# Patient Record
Sex: Male | Born: 1965 | Race: White | Hispanic: No | Smoking: Never smoker
Health system: Southern US, Community
[De-identification: ages and names within clinical notes are randomized; demographics above are authoritative.]

## PROBLEM LIST (undated history)

## (undated) DIAGNOSIS — I739 Peripheral vascular disease, unspecified: Secondary | ICD-10-CM

## (undated) DIAGNOSIS — E119 Type 2 diabetes mellitus without complications: Secondary | ICD-10-CM

## (undated) DIAGNOSIS — N186 End stage renal disease: Secondary | ICD-10-CM

## (undated) DIAGNOSIS — I10 Essential (primary) hypertension: Secondary | ICD-10-CM

## (undated) HISTORY — DX: Peripheral vascular disease, unspecified: I73.9

## (undated) HISTORY — DX: Essential (primary) hypertension: I10

## (undated) HISTORY — DX: Type 2 diabetes mellitus without complications: E11.9

## (undated) HISTORY — DX: End stage renal disease: N18.6

## (undated) HISTORY — PX: AV FISTULA PLACEMENT: SHX1204

## (undated) HISTORY — PX: PARACENTESIS: SHX844

---

## 2014-05-09 ENCOUNTER — Encounter (INDEPENDENT_AMBULATORY_CARE_PROVIDER_SITE_OTHER): Payer: PRIVATE HEALTH INSURANCE | Admitting: Ophthalmology

## 2014-05-09 DIAGNOSIS — E1165 Type 2 diabetes mellitus with hyperglycemia: Secondary | ICD-10-CM

## 2014-05-09 DIAGNOSIS — H43819 Vitreous degeneration, unspecified eye: Secondary | ICD-10-CM

## 2014-05-09 DIAGNOSIS — E11319 Type 2 diabetes mellitus with unspecified diabetic retinopathy without macular edema: Secondary | ICD-10-CM

## 2014-05-09 DIAGNOSIS — I10 Essential (primary) hypertension: Secondary | ICD-10-CM

## 2014-05-09 DIAGNOSIS — H3581 Retinal edema: Secondary | ICD-10-CM

## 2014-05-09 DIAGNOSIS — E1139 Type 2 diabetes mellitus with other diabetic ophthalmic complication: Secondary | ICD-10-CM

## 2014-05-09 DIAGNOSIS — H35039 Hypertensive retinopathy, unspecified eye: Secondary | ICD-10-CM

## 2014-05-09 DIAGNOSIS — H2510 Age-related nuclear cataract, unspecified eye: Secondary | ICD-10-CM

## 2014-06-16 ENCOUNTER — Ambulatory Visit: Payer: Self-pay | Admitting: Cardiology

## 2014-06-16 ENCOUNTER — Encounter: Payer: Self-pay | Admitting: Cardiology

## 2014-06-16 ENCOUNTER — Ambulatory Visit (INDEPENDENT_AMBULATORY_CARE_PROVIDER_SITE_OTHER): Payer: PRIVATE HEALTH INSURANCE | Admitting: Cardiology

## 2014-06-16 VITALS — BP 108/73 | HR 102 | Ht 74.0 in | Wt 161.8 lb

## 2014-06-16 DIAGNOSIS — I951 Orthostatic hypotension: Secondary | ICD-10-CM | POA: Diagnosis not present

## 2014-06-16 DIAGNOSIS — G909 Disorder of the autonomic nervous system, unspecified: Secondary | ICD-10-CM | POA: Diagnosis not present

## 2014-06-16 DIAGNOSIS — E1149 Type 2 diabetes mellitus with other diabetic neurological complication: Secondary | ICD-10-CM

## 2014-06-16 DIAGNOSIS — E1143 Type 2 diabetes mellitus with diabetic autonomic (poly)neuropathy: Secondary | ICD-10-CM

## 2014-06-16 MED ORDER — MIDODRINE HCL 5 MG PO TABS: 5.0000 mg | ORAL_TABLET | Freq: Three times a day (TID) | ORAL | Status: DC

## 2014-06-16 NOTE — Patient Instructions (Signed)
   Start Midodrine 5mg  three times per day. Continue all other medications.   Follow up in  1 month

## 2014-06-16 NOTE — Progress Notes (Signed)
Clinical Summary David Rose is a 48 y.o.male seen today as a new patient.    1. Syncope - approx 1 month ago, 1st episode ever - occurred while at [facility] getting chest ray while standing. Denies any prodrome. Thinks he was out just a few seconds. No palpitations, no chest pain. - has had some dizziness ongoing for 6 months. Can occur with laying, but mainly with standing. Dizziness occurs daily.  - has increased fluid intake recently without much change - from his report discharged after 1 day at [facility].  - reportedly orthostatic in hospital but numbers are not documented - has had overall fatigue, low energy. Mild DOE.   - drinks 2 bottles of water, one large powerade. No sodas, no tea, only occas coffee. - wearing compression stockings as well  2. DM - Hgb A1c 14 during recent admission - followed by pcp   PMH DM    Allergies  Allergen Reactions  . Penicillins      Current Outpatient Prescriptions  Medication Sig Dispense Refill  . aspirin 81 MG tablet Take 81 mg by mouth daily.      . Canagliflozin-Metformin HCl (INVOKAMET) 150-500 MG TABS Take 1 tablet by mouth 2 (two) times daily.      . Cyanocobalamin (VITAMIN B-12 CR PO) Take 1 tablet by mouth daily.      Marland Kitchen glimepiride (AMARYL) 4 MG tablet Take 2 mg by mouth every morning.      Marland Kitchen lisinopril (PRINIVIL,ZESTRIL) 2.5 MG tablet Take 2.5 mg by mouth daily.      . Multiple Vitamin (MULTIVITAMIN) tablet Take 1 tablet by mouth daily.       No current facility-administered medications for this visit.     No past surgical history on file.   Allergies  Allergen Reactions  . Penicillins       No family history on file.   Social History David Rose reports that he has never smoked. He does not have any smokeless tobacco history on file. David Rose has no alcohol history on file.   Review of Systems CONSTITUTIONAL: No weight loss, fever, chills, weakness or fatigue.  HEENT: Eyes: No visual loss,  blurred vision, double vision or yellow sclerae.No hearing loss, sneezing, congestion, runny nose or sore throat.  SKIN: No rash or itching.  CARDIOVASCULAR: no chest pain, no palpitations RESPIRATORY: No shortness of breath, cough or sputum.  GASTROINTESTINAL: No anorexia, nausea, vomiting or diarrhea. No abdominal pain or blood.  GENITOURINARY: No burning on urination, no polyuria NEUROLOGICAL: + dizziness  MUSCULOSKELETAL: No muscle, back pain, joint pain or stiffness.  LYMPHATICS: No enlarged nodes. No history of splenectomy.  PSYCHIATRIC: No history of depression or anxiety.  ENDOCRINOLOGIC: No reports of sweating, cold or heat intolerance. No polyuria or polydipsia.  Marland Kitchen   Physical Examination There were no vitals filed for this visit. Filed Weights   06/16/14 1451  Weight: 161 lb 12.8 oz (73.392 kg)   Lying p 97 bp 132/89 Sitting p 102 bp 102/71  Standing bp 73/49 p 116  Gen: resting comfortably, no acute distress HEENT: no scleral icterus, pupils equal round and reactive, no palptable cervical adenopathy,  CV: RRR, no m/r/g, no JVD, no carotid brutis Resp: Clear to auscultation bilaterally GI: abdomen is soft, non-tender, non-distended, normal bowel sounds, no hepatosplenomegaly MSK: extremities are warm, no edema.  Skin: warm, no rash Neuro:  no focal deficits Psych: appropriate affect   Diagnostic Studies  [place] EKG NSR   Assessment  and Plan  1. Syncope - severely orthostatic by vitals in clinic today (SBP dropped 60 mmHg), he likely does have autonomic dysfunction related to his severe uncontrolled diabetes - symptoms continue despite increased oral intake, counseled to continue increased electrolyte rich fluids, increase salt in diet - will start midodrine 5mg  tid  F/u 1 month     Arnoldo Lenis, M.D., F.A.C.C.

## 2014-06-17 DIAGNOSIS — R55 Syncope and collapse: Secondary | ICD-10-CM | POA: Insufficient documentation

## 2014-06-17 DIAGNOSIS — I951 Orthostatic hypotension: Secondary | ICD-10-CM | POA: Insufficient documentation

## 2014-07-11 ENCOUNTER — Encounter: Payer: Self-pay | Admitting: Cardiology

## 2014-07-11 ENCOUNTER — Ambulatory Visit (INDEPENDENT_AMBULATORY_CARE_PROVIDER_SITE_OTHER): Payer: PRIVATE HEALTH INSURANCE | Admitting: Cardiology

## 2014-07-11 VITALS — BP 83/51 | HR 106 | Ht 73.0 in | Wt 159.8 lb

## 2014-07-11 DIAGNOSIS — I951 Orthostatic hypotension: Secondary | ICD-10-CM

## 2014-07-11 DIAGNOSIS — E1143 Type 2 diabetes mellitus with diabetic autonomic (poly)neuropathy: Secondary | ICD-10-CM

## 2014-07-11 DIAGNOSIS — G909 Disorder of the autonomic nervous system, unspecified: Secondary | ICD-10-CM

## 2014-07-11 DIAGNOSIS — E1149 Type 2 diabetes mellitus with other diabetic neurological complication: Secondary | ICD-10-CM

## 2014-07-11 MED ORDER — FLUDROCORTISONE ACETATE 0.1 MG PO TABS: 0.1000 mg | ORAL_TABLET | Freq: Every day | ORAL | Status: DC

## 2014-07-11 NOTE — Progress Notes (Signed)
Clinical Summary  [name] is a 48 y.o.male seen today as a new patient.  1. Syncope  - approx 1 month ago, 1st episode ever  - occurred while at [facility] getting chest ray while standing. Denies any prodrome. Thinks he was out just a few seconds. No palpitations, no chest pain.  - has had some dizziness ongoing for 6 months. Can occur with laying, but mainly with standing. Dizziness occurs daily.  - last visit noticed to be severely orthostatic in clinic, likely diabetes related autonomic insufficiency, started on midodrine 5mg  tid. - no change in symptoms since our last visit  2. DM  - Hgb A1c 14 during recent admission  - followed by pcp    PMH  DM      Allergies  Allergen Reactions  . Penicillins      Current Outpatient Prescriptions  Medication Sig Dispense Refill  . aspirin 81 MG tablet Take 81 mg by mouth daily.      . Canagliflozin-Metformin HCl (INVOKAMET) 150-500 MG TABS Take 1 tablet by mouth 2 (two) times daily.      . Cyanocobalamin (VITAMIN B-12 CR PO) Take 1 tablet by mouth daily.      Marland Kitchen glimepiride (AMARYL) 4 MG tablet Take 2 mg by mouth every morning.      Marland Kitchen lisinopril (PRINIVIL,ZESTRIL) 2.5 MG tablet Take 2.5 mg by mouth daily.      . midodrine (PROAMATINE) 5 MG tablet Take 1 tablet (5 mg total) by mouth 3 (three) times daily.  90 tablet  6  . Multiple Vitamin (MULTIVITAMIN) tablet Take 1 tablet by mouth daily.       No current facility-administered medications for this visit.     No past surgical history on file.   Allergies  Allergen Reactions  . Penicillins       No family history on file.   Social History [name] reports that he has never smoked. He does not have any smokeless tobacco history on file. [name] has no alcohol history on file.   Review of Systems CONSTITUTIONAL: No weight loss, fever, chills, weakness or fatigue.  HEENT: Eyes: No visual loss, blurred vision, double vision or yellow sclerae.No hearing  loss, sneezing, congestion, runny nose or sore throat.  SKIN: No rash or itching.  CARDIOVASCULAR: no chest pain,no palpitations RESPIRATORY: No shortness of breath, cough or sputum.  GASTROINTESTINAL: No anorexia, nausea, vomiting or diarrhea. No abdominal pain or blood.  GENITOURINARY: No burning on urination, no polyuria NEUROLOGICAL: No headache, dizziness, syncope, paralysis, ataxia, numbness or tingling in the extremities. No change in bowel or bladder control.  MUSCULOSKELETAL: No muscle, back pain, joint pain or stiffness.  LYMPHATICS: No enlarged nodes. No history of splenectomy.  PSYCHIATRIC: No history of depression or anxiety.  ENDOCRINOLOGIC: No reports of sweating, cold or heat intolerance. No polyuria or polydipsia.  Marland Kitchen   Physical Examination p 106 bp 83/51 Wt 159 lbs BMI 21 Gen: resting comfortably, no acute distress HEENT: no scleral icterus, pupils equal round and reactive, no palptable cervical adenopathy,  CV: RRR, no m/r/g, no JVD, no carotid bruits Resp: Clear to auscultation bilaterally GI: abdomen is soft, non-tender, non-distended, normal bowel sounds, no hepatosplenomegaly MSK: extremities are warm, no edema.  Skin: warm, no rash Neuro:  no focal deficits Psych: appropriate affect      Assessment and Plan  1. Diabetic autonomic neuropathy - severely orthostatic by vitals in clinic today (SBP dropped 60 mmHg), he likely does have  autonomic dysfunction related to his severe uncontrolled diabetes  - symptoms continue despite increased oral intake and starting midodrine 5mg  tid - midodrine very expensive for him, we contacted his pharmacy and appears that florinef would be much cheaper. Will change to florinef 0.1mg  daily, titrate as needed - try to continue low dose ACE-I given his DM, ultimately we may have to stop - suspect most likely this is autonomic dysfunction, his K 4.2 and Na 137 do not suggest adrenal insufficiency. Will request most recent labs  from pcp to see if he had TSH done.     F/u 1 month     Arnoldo Lenis, M.D.

## 2014-07-11 NOTE — Patient Instructions (Addendum)
   Stop Midodrine  Start Florinef 0.1mg  daily. Sent to pharmacy. Continue all other medications.   Your physician wants you to follow up in:  3 months.

## 2014-07-17 ENCOUNTER — Encounter: Payer: Self-pay | Admitting: Cardiology

## 2014-07-18 ENCOUNTER — Telehealth: Payer: Self-pay | Admitting: *Deleted

## 2014-07-18 NOTE — Telephone Encounter (Signed)
Wife Manuela Schwartz) calling with update on symptoms.  Does seem to feel a little better right after taking medication, but after standing for a while - still lightheaded & affecting his vision.  Not really much change.  Stated his BP's have stayed around same as was in office.  Wife informed message will be sent to provider for advice.

## 2014-07-21 MED ORDER — FLUDROCORTISONE ACETATE 0.1 MG PO TABS: 0.2000 mg | ORAL_TABLET | Freq: Every day | ORAL | Status: DC

## 2014-07-21 NOTE — Telephone Encounter (Signed)
Lets have him increase his florinef to 0.2mg  po qday and call back in 2 weeks with update on symptoms   Zandra Abts MD

## 2014-07-21 NOTE — Telephone Encounter (Signed)
Wife Manuela Schwartz) notified.

## 2014-07-21 NOTE — Telephone Encounter (Signed)
Wife calling back to inquire if MD answered message from Friday.  Advised her that I would send message again & try to get answer before end of day.

## 2014-07-30 ENCOUNTER — Other Ambulatory Visit: Payer: Self-pay | Admitting: *Deleted

## 2014-07-30 ENCOUNTER — Telehealth: Payer: Self-pay | Admitting: Cardiology

## 2014-07-30 MED ORDER — FLUDROCORTISONE ACETATE 0.1 MG PO TABS: 0.2000 mg | ORAL_TABLET | Freq: Every day | ORAL | Status: DC

## 2014-07-30 NOTE — Telephone Encounter (Signed)
David Rose was told to increase his florinef to 0.2mg  po qday - Will need a refill due to the increase of this medication. [facility] [place], [place]

## 2014-07-30 NOTE — Telephone Encounter (Signed)
RF sent. Pts wife notified.

## 2014-08-05 ENCOUNTER — Telehealth: Payer: Self-pay | Admitting: *Deleted

## 2014-08-05 NOTE — Telephone Encounter (Signed)
2 week f/u after dose increase (florinef 0.2mg )  Per pts wife Pt is feeling better. After 15 mins pt still gets really dizzy (comes and goes) but it's better than before. Dizziness is still affecting his vison.  Pt asking if there will need to be anymore adjustments to his medications

## 2014-08-05 NOTE — Telephone Encounter (Signed)
There may be further adjustments upon Dr. Nelly Laurence next assessment. Continue present dose for now of Florinef.

## 2014-08-11 ENCOUNTER — Other Ambulatory Visit: Payer: Self-pay | Admitting: *Deleted

## 2014-08-11 MED ORDER — FLUDROCORTISONE ACETATE 0.1 MG PO TABS: 0.3000 mg | ORAL_TABLET | Freq: Every day | ORAL | Status: DC

## 2014-08-11 NOTE — Telephone Encounter (Signed)
Left message for pt to return call.

## 2014-08-11 NOTE — Telephone Encounter (Signed)
Pts wife informed.

## 2014-08-11 NOTE — Telephone Encounter (Signed)
Lets have him increase his florinef to 0.3mg  daily and call us inn 2 weeks with update.  Zandra Abts MD

## 2014-08-27 ENCOUNTER — Telehealth: Payer: Self-pay | Admitting: Cardiology

## 2014-08-27 DIAGNOSIS — R55 Syncope and collapse: Secondary | ICD-10-CM

## 2014-08-27 DIAGNOSIS — I951 Orthostatic hypotension: Secondary | ICD-10-CM

## 2014-08-27 NOTE — Telephone Encounter (Signed)
WIFE Called back with update.  Still dizzy when he stands up and feels that his skin is still crawling  Would like to know if anything else needs to be done with his medication

## 2014-08-28 MED ORDER — FLUDROCORTISONE ACETATE 0.1 MG PO TABS: 0.4000 mg | ORAL_TABLET | Freq: Every day | ORAL | Status: DC

## 2014-08-28 NOTE — Telephone Encounter (Signed)
I'm not sure about the skin feelings he is having, do not see how that would be related to medication. Would discuss with pcp  Zandra Abts MD

## 2014-08-28 NOTE — Telephone Encounter (Signed)
Is he feeling any better on the new medicine? Please ask if he is having in swelling in his legs? Please also order a BMET for him with GFR.  I would bump up his florinef to 0.4mg  daily and have them call us back in 2 weeks. We are getting toward the max dose of florinef, if continued symptoms we may need to add back the midodrine in the near future   Zandra Abts MD

## 2014-08-28 NOTE — Telephone Encounter (Signed)
Per wife, patient has some good days but still having the dizziness. Also described a feeling of "crawling on patient's skin". Patient sill gets fatigued when walking. No c/o swelling in legs.

## 2014-08-28 NOTE — Telephone Encounter (Signed)
Patient's wife informed. Lab order faxed to [facility] lab.

## 2014-09-03 ENCOUNTER — Ambulatory Visit (INDEPENDENT_AMBULATORY_CARE_PROVIDER_SITE_OTHER): Payer: PRIVATE HEALTH INSURANCE | Admitting: Ophthalmology

## 2014-09-03 DIAGNOSIS — I10 Essential (primary) hypertension: Secondary | ICD-10-CM

## 2014-09-03 DIAGNOSIS — H35033 Hypertensive retinopathy, bilateral: Secondary | ICD-10-CM

## 2014-09-03 DIAGNOSIS — H43813 Vitreous degeneration, bilateral: Secondary | ICD-10-CM

## 2014-09-03 DIAGNOSIS — E11311 Type 2 diabetes mellitus with unspecified diabetic retinopathy with macular edema: Secondary | ICD-10-CM

## 2014-09-03 DIAGNOSIS — E11331 Type 2 diabetes mellitus with moderate nonproliferative diabetic retinopathy with macular edema: Secondary | ICD-10-CM

## 2014-09-03 DIAGNOSIS — E11329 Type 2 diabetes mellitus with mild nonproliferative diabetic retinopathy without macular edema: Secondary | ICD-10-CM

## 2014-09-05 ENCOUNTER — Encounter: Payer: Self-pay | Admitting: *Deleted

## 2014-09-05 ENCOUNTER — Telehealth: Payer: Self-pay | Admitting: *Deleted

## 2014-09-05 NOTE — Telephone Encounter (Signed)
-----   Message from Merlene Laughter, LPN sent at [id number] 11:04 AM EST -----   ----- Message -----    From: Arnoldo Lenis, MD    Sent: 09/05/2014   9:46 AM      To: Bernita Raisin, RN  Normal labs

## 2014-09-05 NOTE — Telephone Encounter (Signed)
Patient informed via my chart.

## 2014-09-10 ENCOUNTER — Ambulatory Visit (INDEPENDENT_AMBULATORY_CARE_PROVIDER_SITE_OTHER): Payer: PRIVATE HEALTH INSURANCE | Admitting: Ophthalmology

## 2014-09-22 ENCOUNTER — Telehealth: Payer: Self-pay | Admitting: Cardiology

## 2014-09-22 NOTE — Telephone Encounter (Signed)
Patient wife Manuela Schwartz states Dr. Harl Bowie was trying to regulate his blood pressure by adjusting the dosage and what he is currently on is not helping.

## 2014-09-22 NOTE — Telephone Encounter (Signed)
Left message to return call 

## 2014-09-23 NOTE — Telephone Encounter (Signed)
Patient called to give an update on his symptoms per Dr. Nelly Laurence request. Patient denies swelling but continue to have dizziness on the increased dose of florinef 0.4 mg daily. Patient has been checking his blood pressures at home randomly and says that his BP has been averaging around 80/40.

## 2014-09-29 NOTE — Telephone Encounter (Signed)
Left message to return call 

## 2014-09-29 NOTE — Telephone Encounter (Signed)
We are maxed out on the dosing of florinef. He is going to need to restart the midodrine that he was on previously 5mg  three times a day. Continue heavy salt and fluid intake.  Zandra Abts MD

## 2014-10-02 MED ORDER — MIDODRINE HCL 5 MG PO TABS: 5.0000 mg | ORAL_TABLET | Freq: Three times a day (TID) | ORAL | Status: DC

## 2014-10-02 NOTE — Telephone Encounter (Signed)
Patient notified.  Will send [facility] to [facility] [place].  He already has f/u scheduled for 10/10/14 at 4:00 with Dr. Harl Bowie.

## 2014-10-10 ENCOUNTER — Ambulatory Visit (INDEPENDENT_AMBULATORY_CARE_PROVIDER_SITE_OTHER): Payer: PRIVATE HEALTH INSURANCE | Admitting: Cardiology

## 2014-10-10 ENCOUNTER — Encounter: Payer: Self-pay | Admitting: Cardiology

## 2014-10-10 VITALS — BP 67/43 | HR 103 | Ht 73.0 in | Wt 155.0 lb

## 2014-10-10 DIAGNOSIS — I951 Orthostatic hypotension: Secondary | ICD-10-CM | POA: Diagnosis not present

## 2014-10-10 DIAGNOSIS — E1143 Type 2 diabetes mellitus with diabetic autonomic (poly)neuropathy: Secondary | ICD-10-CM | POA: Diagnosis not present

## 2014-10-10 DIAGNOSIS — R55 Syncope and collapse: Secondary | ICD-10-CM | POA: Diagnosis not present

## 2014-10-10 MED ORDER — FLUDROCORTISONE ACETATE 0.1 MG PO TABS: 0.5000 mg | ORAL_TABLET | Freq: Every day | ORAL | Status: DC

## 2014-10-10 NOTE — Progress Notes (Signed)
Clinical Summary David Rose is a 48 y.o.male seen today for follow up of the following medical problems.l  1. Autonomic insufficiency - previously noted to be severely orthostatic in clinic. Initiated on fludrocortisone which we have been titrating, and started on midodrine 5mg  tid - probable diabetic autonomic neuropathy, poorly controlled DM previously with HgbA1c of 14.  - symptoms were not controlled with fludrocortisone at 0.4mg  daily, increased oral intake, and compression stockings. Asked to start midodrine 5mg  tid which he has started but mistakingly stopped taking his florinef - continues to have orthostatic dizziness, no syncope   2. DM - followed by pcp Past Medical History  Diagnosis Date  . Type 2 diabetes mellitus   . Peripheral arterial disease      Allergies  Allergen Reactions  . Penicillins      Current Outpatient Prescriptions  Medication Sig Dispense Refill  . aspirin 81 MG tablet Take 81 mg by mouth daily.    . Canagliflozin-Metformin HCl (INVOKAMET) 150-500 MG TABS Take 1 tablet by mouth 2 (two) times daily.    . Cyanocobalamin (VITAMIN B-12 CR PO) Take 1 tablet by mouth daily.    . fludrocortisone (FLORINEF) 0.1 MG tablet Take 4 tablets (0.4 mg total) by mouth daily. 120 tablet 6  . glimepiride (AMARYL) 4 MG tablet Take 2 mg by mouth every morning.    Marland Kitchen lisinopril (PRINIVIL,ZESTRIL) 2.5 MG tablet Take 2.5 mg by mouth daily.    . midodrine (PROAMATINE) 5 MG tablet Take 1 tablet (5 mg total) by mouth 3 (three) times daily. 90 tablet 6  . Multiple Vitamin (MULTIVITAMIN) tablet Take 1 tablet by mouth daily.     No current facility-administered medications for this visit.     No past surgical history on file.   Allergies  Allergen Reactions  . Penicillins       Family History  Problem Relation Age of Onset  . Leukemia Father   . Thyroid cancer Mother      Social History David Rose reports that he has never smoked. He does not have  any smokeless tobacco history on file. David Rose has no alcohol history on file.   Review of Systems CONSTITUTIONAL: No weight loss, fever, chills, weakness or fatigue.  HEENT: Eyes: No visual loss, blurred vision, double vision or yellow sclerae.No hearing loss, sneezing, congestion, runny nose or sore throat.  SKIN: No rash or itching.  CARDIOVASCULAR: no chest pain, no palpitations RESPIRATORY: No shortness of breath, cough or sputum.  GASTROINTESTINAL: No anorexia, nausea, vomiting or diarrhea. No abdominal pain or blood.  GENITOURINARY: No burning on urination, no polyuria NEUROLOGICAL: +dizziness MUSCULOSKELETAL: No muscle, back pain, joint pain or stiffness.  LYMPHATICS: No enlarged nodes. No history of splenectomy.  PSYCHIATRIC: No history of depression or anxiety.  ENDOCRINOLOGIC: No reports of sweating, cold or heat intolerance. No polyuria or polydipsia.  Marland Kitchen   Physical Examination Gen: resting comfortably, no acute distress HEENT: no scleral icterus, pupils equal round and reactive, no palptable cervical adenopathy,  CV: RRR, no m/rg, no JVD Resp: Clear to auscultation bilaterally GI: abdomen is soft, non-tender, non-distended, normal bowel sounds, no hepatosplenomegaly MSK: extremities are warm, no edema.  Skin: warm, no rash Neuro:  no focal deficits Psych: appropriate affect   Diagnostic Studies  04/2014 Echo LVEF 55-60%, no significant valvular pathology   Assessment and Plan  1. Diabetic autonomic neuropathy - remains severely orthostatic by vitals in clinic today, he likely does have autonomic dysfunction related to  his severe uncontrolled diabetes  - will have him restart his florinef but increase to 0.5mg  daily. Midodrine is very expensive for him and he is asking to hold on this medication for now which we will do. - symptoms continue despite increased oral intake and starting midodrine 5mg  tid - try to continue low dose ACE-I given his DM, ultimately  we may have to stop - he will call in a few weeks to update on symptoms     David Rose, M.D.

## 2014-10-10 NOTE — Patient Instructions (Addendum)
   Hold Midodrine for now.  Begin Florinef 0.1mg  - 5 tabs daily - new sent to pharm Continue all other medications.   Call office in 1 week with update on how doing. Follow up in  3 months

## 2014-11-24 ENCOUNTER — Encounter: Payer: Self-pay | Admitting: Cardiology

## 2014-11-28 ENCOUNTER — Ambulatory Visit (INDEPENDENT_AMBULATORY_CARE_PROVIDER_SITE_OTHER): Payer: PRIVATE HEALTH INSURANCE | Admitting: Cardiology

## 2014-11-28 ENCOUNTER — Encounter: Payer: Self-pay | Admitting: *Deleted

## 2014-11-28 ENCOUNTER — Telehealth: Payer: Self-pay | Admitting: *Deleted

## 2014-11-28 ENCOUNTER — Encounter: Payer: Self-pay | Admitting: Cardiology

## 2014-11-28 VITALS — BP 160/90 | HR 120 | Ht 74.0 in | Wt 176.0 lb

## 2014-11-28 DIAGNOSIS — I951 Orthostatic hypotension: Secondary | ICD-10-CM | POA: Diagnosis not present

## 2014-11-28 DIAGNOSIS — E1143 Type 2 diabetes mellitus with diabetic autonomic (poly)neuropathy: Secondary | ICD-10-CM | POA: Diagnosis not present

## 2014-11-28 MED ORDER — FLUDROCORTISONE ACETATE 0.1 MG PO TABS: 0.0500 mg | ORAL_TABLET | Freq: Every day | ORAL | Status: DC

## 2014-11-28 NOTE — Telephone Encounter (Signed)
Pt made aware, updated medication list

## 2014-11-28 NOTE — Progress Notes (Signed)
Clinical Summary David Rose is a 49 y.o.male seen today for follow up of the following medical problems.   1. Autonomic insufficiency - probable diabetic autonomic neuropathy, poorly controlled DM previously with HgbA1c of 14. - initially on midodrine, changed to fludrocortisone due to costs. Have been titrating up dose. Last visit increased to 0.5 mg daily - developed some SOB, no LE edema but cxray showed pleural effusions. Fludro decreased back to 0.4mg  daily, pcp started on lasix.  - we have also continued low dose lisionpril for renal protection purposes.  - reports symptoms are overall mild even after medication change   Past Medical History  Diagnosis Date  . Type 2 diabetes mellitus   . Peripheral arterial disease      Allergies  Allergen Reactions  . Penicillins      Current Outpatient Prescriptions  Medication Sig Dispense Refill  . aspirin 81 MG tablet Take 81 mg by mouth daily.    . Canagliflozin-Metformin HCl (INVOKAMET) 150-500 MG TABS Take 1 tablet by mouth 2 (two) times daily.    . Cyanocobalamin (VITAMIN B-12 CR PO) Take 1 tablet by mouth daily.    . fludrocortisone (FLORINEF) 0.1 MG tablet Take 5 tablets (0.5 mg total) by mouth daily. 150 tablet 6  . glimepiride (AMARYL) 4 MG tablet Take 2 mg by mouth every morning.    Marland Kitchen lisinopril (PRINIVIL,ZESTRIL) 2.5 MG tablet Take 2.5 mg by mouth daily.    . Multiple Vitamin (MULTIVITAMIN) tablet Take 1 tablet by mouth daily.     No current facility-administered medications for this visit.     No past surgical history on file.   Allergies  Allergen Reactions  . Penicillins       Family History  Problem Relation Age of Onset  . Leukemia Father   . Thyroid cancer Mother      Social History David Rose reports that he has never smoked. He has never used smokeless tobacco. David Rose has no alcohol history on file.   Review of Systems CONSTITUTIONAL: No weight loss, fever, chills, weakness or  fatigue.  HEENT: Eyes: No visual loss, blurred vision, double vision or yellow sclerae.No hearing loss, sneezing, congestion, runny nose or sore throat.  SKIN: No rash or itching.  CARDIOVASCULAR: per HPI RESPIRATORY: No shortness of breath, cough or sputum.  GASTROINTESTINAL: No anorexia, nausea, vomiting or diarrhea. No abdominal pain or blood.  GENITOURINARY: No burning on urination, no polyuria NEUROLOGICAL: No headache, dizziness, syncope, paralysis, ataxia, numbness or tingling in the extremities. No change in bowel or bladder control.  MUSCULOSKELETAL: No muscle, back pain, joint pain or stiffness.  LYMPHATICS: No enlarged nodes. No history of splenectomy.  PSYCHIATRIC: No history of depression or anxiety.  ENDOCRINOLOGIC: No reports of sweating, cold or heat intolerance. No polyuria or polydipsia.  Marland Kitchen   Physical Examination p 96 bp 160/90 Wt 176 lbs BMI 23 Gen: resting comfortably, no acute distress HEENT: no scleral icterus, pupils equal round and reactive, no palptable cervical adenopathy,  CV: RRR, no m/r/g, no JVD, no carotid bruits Resp: Clear to auscultation bilaterally GI: abdomen is soft, non-tender, non-distended, normal bowel sounds, no hepatosplenomegaly MSK: extremities are warm, no edema.  Skin: warm, no rash Neuro:  no focal deficits Psych: appropriate affect   Diagnostic Studies  04/2014 Echo LVEF 55-60%, no significant valvular pathology    Assessment and Plan  1. Diabetic autonomic neuropathy - had been on fludrocortisone 0.5mg  daily, no LE edema but pleural effusions possibly related -  fludro decreased to 0.4 mg daily, will change lasix to prn only - continue to follow effusions, if neccesary may have to switch back to midodrine though cost has been an issue. Try to continue low dose ACE for renal protection given his uncontrolled DM  F/u 1 months    David Rose, M.D.

## 2014-11-28 NOTE — Patient Instructions (Signed)
Your physician recommends that you schedule a follow-up appointment in: 1 month with Dr. Harl Bowie  Your physician has recommended you make the following change in your medication:   DECREASE FLORINEF 1/2 TAB DAILY  WE WILL [place] DR. VYAS  Thank you for choosing [facility]!!

## 2014-11-28 NOTE — Telephone Encounter (Signed)
He should continue 0.4mg  daily (4 pills of the 0.1mg ) the way he was taking it.    Zandra Abts MD

## 2014-11-28 NOTE — Telephone Encounter (Signed)
Pt wants clarification on florinef. Says he was taking 4 pills daily of 0.1 mg. Is 0.05 mg daily the correct does as of changes today. Will forward to Dr. Harl Bowie

## 2014-12-12 ENCOUNTER — Telehealth: Payer: Self-pay | Admitting: Cardiology

## 2014-12-12 NOTE — Telephone Encounter (Signed)
Will forward to Dr. Branch. 

## 2014-12-12 NOTE — Telephone Encounter (Signed)
Glad swelling has improved. It seems that going up to 0.5mg  daily was what led to the swelling. If needed for dizziness I think he would do ok with 0.3 or 0.4 mg daily if his dizziness becomes worst.   Zandra Abts MD

## 2014-12-12 NOTE — Telephone Encounter (Signed)
Patient was told to call and give an update on how he was doing with the changes to his medication.  He is scheduled to see provider on March 2nd for follow up.  Patient is aware provider is off and the message would be seen by his nurse.

## 2014-12-12 NOTE — Telephone Encounter (Signed)
Pt made aware, and will call if swelling or dizziness becomes worse, pt says he has not had much dizziness and swelling better than before.

## 2014-12-24 ENCOUNTER — Ambulatory Visit (INDEPENDENT_AMBULATORY_CARE_PROVIDER_SITE_OTHER): Payer: PRIVATE HEALTH INSURANCE | Admitting: Cardiology

## 2014-12-24 ENCOUNTER — Encounter: Payer: Self-pay | Admitting: *Deleted

## 2014-12-24 ENCOUNTER — Encounter: Payer: Self-pay | Admitting: Cardiology

## 2014-12-24 VITALS — BP 155/88 | HR 90 | Ht 74.0 in | Wt 173.0 lb

## 2014-12-24 DIAGNOSIS — E1143 Type 2 diabetes mellitus with diabetic autonomic (poly)neuropathy: Secondary | ICD-10-CM

## 2014-12-24 MED ORDER — FUROSEMIDE 20 MG PO TABS: ORAL_TABLET | ORAL | Status: DC

## 2014-12-24 NOTE — Patient Instructions (Signed)
Your physician recommends that you schedule a follow-up appointment in: [place] DR. BRANCH  Your physician has recommended you make the following change in your medication:   START LASIX 20 MG DAILY AS NEEDED FOR SWELLING  CONTINUE ALL OTHER MEDICATIONS AS DIRECTED  WE WILL REQUEST CHEST XRAY FROM DR. VYAS.  Thank you for choosing [facility]!!

## 2014-12-24 NOTE — Progress Notes (Signed)
Clinical Summary David Rose is a 49 y.o.male seen today for follow up of the following medical problems.   1. Autonomic insufficiency - hx of poorly controlled DM previously with HgbA1c of 14. - initially on midodrine, changed to fludrocortisone due to costs. - titrated up fludrocortisone in setting of continued symptoms, at 0.5mg  daily developed fluid retention with SOB and pleural effusions - down to 0.2mg  daily of fludrocortisone, symptoms seemed to be well balanced as far as minimal dizzines and not much swelling - we have also continued low dose lisionpril for renal protection purposes.    Past Medical History  Diagnosis Date  . Type 2 diabetes mellitus   . Peripheral arterial disease      Allergies  Allergen Reactions  . Penicillins      Current Outpatient Prescriptions  Medication Sig Dispense Refill  . aspirin 81 MG tablet Take 81 mg by mouth daily.    . Canagliflozin-Metformin HCl (INVOKAMET) 150-500 MG TABS Take 1 tablet by mouth daily.     . Cyanocobalamin (VITAMIN B-12 CR PO) Take 1 tablet by mouth daily.    . fludrocortisone (FLORINEF) 0.1 MG tablet Take 0.5 tablets (0.05 mg total) by mouth daily. (Patient taking differently: Take 0.1 mg by mouth daily. 4 tablets daily) 30 tablet 3  . glimepiride (AMARYL) 4 MG tablet Take 2 mg by mouth every morning.    Marland Kitchen lisinopril (PRINIVIL,ZESTRIL) 2.5 MG tablet Take 2.5 mg by mouth daily.    . Multiple Vitamin (MULTIVITAMIN) tablet Take 1 tablet by mouth daily.     No current facility-administered medications for this visit.     No past surgical history on file.   Allergies  Allergen Reactions  . Penicillins       Family History  Problem Relation Age of Onset  . Leukemia Father   . Thyroid cancer Mother      Social History Mr. Morter reports that he has never smoked. He has never used smokeless tobacco. Mr. Ciccarello has no alcohol history on file.   Review of Systems CONSTITUTIONAL: No weight  loss, fever, chills, weakness or fatigue.  HEENT: Eyes: No visual loss, blurred vision, double vision or yellow sclerae.No hearing loss, sneezing, congestion, runny nose or sore throat.  SKIN: No rash or itching.  CARDIOVASCULAR: no chest pain, no palpitaitons RESPIRATORY: No shortness of breath, cough or sputum.  GASTROINTESTINAL: No anorexia, nausea, vomiting or diarrhea. No abdominal pain or blood.  GENITOURINARY: No burning on urination, no polyuria NEUROLOGICAL: +dizziness MUSCULOSKELETAL: No muscle, back pain, joint pain or stiffness.  LYMPHATICS: No enlarged nodes. No history of splenectomy.  PSYCHIATRIC: No history of depression or anxiety.  ENDOCRINOLOGIC: No reports of sweating, cold or heat intolerance. No polyuria or polydipsia.  Marland Kitchen   Physical Examination p 90 bp 155/88 Wt 173 lbs BMI 22 Gen: resting comfortably, no acute distress HEENT: no scleral icterus, pupils equal round and reactive, no palptable cervical adenopathy,  CV: RRR,no m/r/g, no JVD Resp: Clear to auscultation bilaterally GI: abdomen is soft, non-tender, non-distended, normal bowel sounds, no hepatosplenomegaly MSK: extremities are warm, no edema.  Skin: warm, no rash Neuro:  no focal deficits Psych: appropriate affect   Diagnostic Studies 04/2014 Echo LVEF 55-60%, no significant valvular pathology    Assessment and Plan  1. Diabetic autonomic neuropathy - symptoms fairly well controlled. Limited on higher fludrocortisone dosing due to previous swelling, not able to get midodrine due to costs   F/u 4 months  Arnoldo Lenis, M.D.

## 2015-01-05 ENCOUNTER — Telehealth: Payer: Self-pay | Admitting: *Deleted

## 2015-01-05 NOTE — Telephone Encounter (Signed)
Would start midodrine 5mg  three times a day. Let him know I tried looking for cost assistance programs but could not find any listed. Do we have a [employer] rep we can call and ask about assistance or samples for midodrine?   Zandra Abts MD

## 2015-01-05 NOTE — Addendum Note (Signed)
Addended by: Julian Hy T on: 01/05/2015 03:13 PM   Modules accepted: Orders, Medications

## 2015-01-05 NOTE — Telephone Encounter (Signed)
Please have him start midodrine 5mg  three times a day and also stop his lisinopril   Zandra Abts MD   Pt made aware to stop lisinopril.

## 2015-01-05 NOTE — Telephone Encounter (Signed)
Pt called and wanted to start midodrine. States he had discussed this with Dr. Harl Bowie at [facility] and couldn't afford it but is now ready to start. States that florinef is making him dizzy at times. Says he has midodrine 5 mg at home. Will forward to Dr. Harl Bowie

## 2015-01-05 NOTE — Telephone Encounter (Signed)
Pt made aware. Says he is stopping florinef per conversation with Dr. Harl Bowie. Medication list updated

## 2015-01-12 ENCOUNTER — Ambulatory Visit: Payer: PRIVATE HEALTH INSURANCE | Admitting: Cardiology

## 2015-01-21 ENCOUNTER — Telehealth: Payer: Self-pay | Admitting: *Deleted

## 2015-01-21 NOTE — Telephone Encounter (Signed)
Pt made aware, and will update [place] on Monday regarding swelling.

## 2015-01-21 NOTE — Telephone Encounter (Signed)
Patient confirmed that he is not taking florinef.

## 2015-01-21 NOTE — Telephone Encounter (Signed)
Please varify if he is still taking the florinef and if so what dose?   Zandra Abts MD

## 2015-01-21 NOTE — Telephone Encounter (Signed)
Pt informing Dr. Harl Bowie that since med change he is still experiencing swelling and fluid retention. Will forward to Dr. Harl Bowie

## 2015-01-21 NOTE — Telephone Encounter (Signed)
I would suggest taking his prn lasix for a few days until the swelling improves  Zandra Abts MD

## 2015-01-28 ENCOUNTER — Telehealth: Payer: Self-pay | Admitting: *Deleted

## 2015-01-28 NOTE — Telephone Encounter (Signed)
Pt states swelling has improved over the last few days with prn lasix. Pt denies any other complaints and will call with any changes. Will forward to Dr. Harl Bowie as Juluis Rainier

## 2015-02-09 ENCOUNTER — Telehealth: Payer: Self-pay | Admitting: *Deleted

## 2015-02-09 MED ORDER — MIDODRINE HCL 5 MG PO TABS: 15.0000 mg | ORAL_TABLET | Freq: Three times a day (TID) | ORAL | Status: DC

## 2015-02-09 NOTE — Telephone Encounter (Signed)
Can refill and increase midodrine to 15mg  tid.  Zandra Abts MD

## 2015-02-09 NOTE — Telephone Encounter (Signed)
Pt would like new rx for midodrine. States he is taking 2 tablet (10 mg) TID. Asking to increase to 15 mg TID. Explained that would need to clear this with provider. Will forward to Dr. Harl Bowie.

## 2015-02-09 NOTE — Telephone Encounter (Signed)
Medication sent to pharmacy  

## 2015-02-24 ENCOUNTER — Ambulatory Visit (INDEPENDENT_AMBULATORY_CARE_PROVIDER_SITE_OTHER): Payer: PRIVATE HEALTH INSURANCE | Admitting: Cardiology

## 2015-02-24 ENCOUNTER — Encounter: Payer: Self-pay | Admitting: Cardiology

## 2015-02-24 VITALS — BP 84/58 | HR 84 | Ht 74.0 in | Wt 147.0 lb

## 2015-02-24 DIAGNOSIS — I951 Orthostatic hypotension: Secondary | ICD-10-CM | POA: Diagnosis not present

## 2015-02-24 DIAGNOSIS — R55 Syncope and collapse: Secondary | ICD-10-CM

## 2015-02-24 MED ORDER — MIDODRINE HCL 5 MG PO TABS: 15.0000 mg | ORAL_TABLET | Freq: Three times a day (TID) | ORAL | Status: DC

## 2015-02-24 MED ORDER — FLUDROCORTISONE ACETATE 0.1 MG PO TABS: 0.2000 mg | ORAL_TABLET | Freq: Every day | ORAL | Status: DC

## 2015-02-24 NOTE — Progress Notes (Signed)
Clinical Summary David Rose is a 49 y.o.male seen today for follow up of the following medical problems.   1. Autonomic insufficiency - hx of poorly controlled DM previously with HgbA1c of 14. - initially on midodrine, changed to fludrocortisone due to costs. Did well on florinef however after titrating to 0.4mg  daily began to develop significant LE edema. Last visit he had asked to go back to the midodrine. Since then he has self titrated up to 30mg  tid.  - he maintains good oral intake with powerades, water, and increased sodium intake. Compliant with compression stockings.  - continues to be very symptomatic, 2 episodes of syncope over the last few days.    Past Medical History  Diagnosis Date  . Type 2 diabetes mellitus   . Peripheral arterial disease      Allergies  Allergen Reactions  . Penicillins      Current Outpatient Prescriptions  Medication Sig Dispense Refill  . aspirin 81 MG tablet Take 81 mg by mouth daily.    . Canagliflozin-Metformin HCl (INVOKAMET) 150-500 MG TABS Take 1 tablet by mouth daily.     . Cyanocobalamin (VITAMIN B-12 CR PO) Take 1 tablet by mouth daily.    . furosemide (LASIX) 20 MG tablet Take 1 tab daily as needed for swelling 90 tablet 3  . glimepiride (AMARYL) 4 MG tablet Take 2 mg by mouth every morning.    . midodrine (PROAMATINE) 5 MG tablet Take 3 tablets (15 mg total) by mouth 3 (three) times daily with meals. 270 tablet 1  . Multiple Vitamin (MULTIVITAMIN) tablet Take 1 tablet by mouth daily.     No current facility-administered medications for this visit.     No past surgical history on file.   Allergies  Allergen Reactions  . Penicillins       Family History  Problem Relation Age of Onset  . Leukemia Father   . Thyroid cancer Mother      Social History David Rose reports that he has never smoked. He has never used smokeless tobacco. David Rose has no alcohol history on file.   Review of  Systems CONSTITUTIONAL: No weight loss, fever, chills, weakness or fatigue.  HEENT: Eyes: No visual loss, blurred vision, double vision or yellow sclerae.No hearing loss, sneezing, congestion, runny nose or sore throat.  SKIN: No rash or itching.  CARDIOVASCULAR: per HPI RESPIRATORY: No shortness of breath, cough or sputum.  GASTROINTESTINAL: No anorexia, nausea, vomiting or diarrhea. No abdominal pain or blood.  GENITOURINARY: No burning on urination, no polyuria NEUROLOGICAL: No headache, dizziness, syncope, paralysis, ataxia, numbness or tingling in the extremities. No change in bowel or bladder control.  MUSCULOSKELETAL: No muscle, back pain, joint pain or stiffness.  LYMPHATICS: No enlarged nodes. No history of splenectomy.  PSYCHIATRIC: No history of depression or anxiety.  ENDOCRINOLOGIC: No reports of sweating, cold or heat intolerance. No polyuria or polydipsia.  Marland Kitchen   Physical Examination p 84 bp 84/58 Wt 147 lbs BMI 19 Gen: resting comfortably, no acute distress HEENT: no scleral icterus, pupils equal round and reactive, no palptable cervical adenopathy,  CV: RRR, no m/r/g, no JVD, no carotid bruits Resp: Clear to auscultation bilaterally GI: abdomen is soft, non-tender, non-distended, normal bowel sounds, no hepatosplenomegaly MSK: extremities are warm, no edema.  Skin: warm, no rash Neuro:  no focal deficits Psych: appropriate affect   Diagnostic Studies 04/2014 Echo LVEF 55-60%, no significant valvular pathology    Assessment and Plan   1.  Diabetic autonomic neuropathy - severe symptomatic orthostatic hypotension. Management has been complicated due to high costs of midodrine and some LE edema on florinef - he has self titrated midodrine well above recommended dosing, will decrease back to 15mg  tid. Restart low dose florinef 0.2mg  daily, hopefully with combination of meds will have more success. Midodrine costs continue to be an issue, he is going to try a mail order  pharmacy to see if more affordable. We also discussed potentially trying to enroll him in drug assistance program for [employer], he was given some brochure information. - he is to update if he is able to get midodrine at more affordable price.        David Rose, M.D

## 2015-02-24 NOTE — Patient Instructions (Addendum)
Your physician recommends that you schedule a follow-up appointment in: 1 month with Dr. Harl Bowie  Your physician has recommended you make the following change in your medication:   CHANGE MIDODRINE 15 MG THREE TIMES DAILY  START FLORINEF 0.2 MG DAILY  CONTINUE ALL OTHER MEDICATIONS AS DIRECTED  Your physician recommends that you return for lab work BEFORE YOUR NEXT OFFICE VISIT(BMP/MAG/TSH/CORTISOL)  Thank you for choosing [facility]!!

## 2015-02-25 ENCOUNTER — Telehealth: Payer: Self-pay | Admitting: *Deleted

## 2015-02-25 MED ORDER — FLUDROCORTISONE ACETATE 0.1 MG PO TABS: 0.2000 mg | ORAL_TABLET | Freq: Every day | ORAL | Status: DC

## 2015-02-25 MED ORDER — MIDODRINE HCL 5 MG PO TABS: 15.0000 mg | ORAL_TABLET | Freq: Three times a day (TID) | ORAL | Status: DC

## 2015-02-25 NOTE — Telephone Encounter (Signed)
Per pt request, faxed florinef and midodrine with 62 day supply to [phone]

## 2015-03-26 ENCOUNTER — Ambulatory Visit (INDEPENDENT_AMBULATORY_CARE_PROVIDER_SITE_OTHER): Payer: PRIVATE HEALTH INSURANCE | Admitting: Cardiology

## 2015-03-26 ENCOUNTER — Telehealth: Payer: Self-pay | Admitting: *Deleted

## 2015-03-26 ENCOUNTER — Encounter: Payer: Self-pay | Admitting: Cardiology

## 2015-03-26 VITALS — BP 196/107 | HR 80 | Ht 74.0 in | Wt 169.0 lb

## 2015-03-26 DIAGNOSIS — I951 Orthostatic hypotension: Secondary | ICD-10-CM

## 2015-03-26 DIAGNOSIS — E1143 Type 2 diabetes mellitus with diabetic autonomic (poly)neuropathy: Secondary | ICD-10-CM

## 2015-03-26 NOTE — Telephone Encounter (Signed)
-----   Message from Arnoldo Lenis, MD sent at 03/25/2015  1:58 PM EDT ----- Labs look good  Zandra Abts MD

## 2015-03-26 NOTE — Telephone Encounter (Signed)
Pt made aware, route to pcp

## 2015-03-26 NOTE — Progress Notes (Signed)
Clinical Summary David Rose is a 49 y.o.male seen today for follow up of the following medical problems.   1. Autonomic insufficiency - hx of poorly controlled DM previously with HgbA1c of 14. - severe autonomic insufficiency by orthostatics previously in clinic. He has had prior orthostatic syncopal episodes - initially on midodrine, changed to fludrocortisone due to costs. Did well on florinef however after titrating to 0.4mg  daily began to develop significant LE edema. - he has been able to get midodrine at much better price through a mail in pharmacy and it is not affordable - currently on midodrine 15mg  tid, florinef 0.2mg  daily - symptoms fairly well controlled, he does have significant HTN with laying or sitting which is likely causing some of his headaches.  - he maintains good oral intake with powerades, water, and increased sodium intake. Compliant with compression stockings.    Past Medical History  Diagnosis Date  . Type 2 diabetes mellitus   . Peripheral arterial disease      Allergies  Allergen Reactions  . Penicillins      Current Outpatient Prescriptions  Medication Sig Dispense Refill  . aspirin 81 MG tablet Take 81 mg by mouth daily.    . Canagliflozin-Metformin HCl (INVOKAMET) 150-500 MG TABS Take 1 tablet by mouth daily.     . Cyanocobalamin (VITAMIN B-12 CR PO) Take 1 tablet by mouth daily.    . fludrocortisone (FLORINEF) 0.1 MG tablet Take 2 tablets (0.2 mg total) by mouth daily. 248 tablet 3  . furosemide (LASIX) 20 MG tablet Take 1 tab daily as needed for swelling 90 tablet 3  . glimepiride (AMARYL) 4 MG tablet Take 2 mg by mouth every morning.    . midodrine (PROAMATINE) 5 MG tablet Take 3 tablets (15 mg total) by mouth 3 (three) times daily with meals. 558 tablet 3  . Multiple Vitamin (MULTIVITAMIN) tablet Take 1 tablet by mouth daily.     No current facility-administered medications for this visit.     No past surgical history on  file.   Allergies  Allergen Reactions  . Penicillins       Family History  Problem Relation Age of Onset  . Leukemia Father   . Thyroid cancer Mother      Social History David Rose reports that he has never smoked. He has never used smokeless tobacco. David Rose has no alcohol history on file.   Review of Systems CONSTITUTIONAL: No weight loss, fever, chills, weakness or fatigue.  HEENT: Eyes: No visual loss, blurred vision, double vision or yellow sclerae.No hearing loss, sneezing, congestion, runny nose or sore throat.  SKIN: No rash or itching.  CARDIOVASCULAR: no chest pain, no palpitations RESPIRATORY: No shortness of breath, cough or sputum.  GASTROINTESTINAL: No anorexia, nausea, vomiting or diarrhea. No abdominal pain or blood.  GENITOURINARY: No burning on urination, no polyuria NEUROLOGICAL: +dizziness MUSCULOSKELETAL: No muscle, back pain, joint pain or stiffness.  LYMPHATICS: No enlarged nodes. No history of splenectomy.  PSYCHIATRIC: No history of depression or anxiety.  ENDOCRINOLOGIC: No reports of sweating, cold or heat intolerance. No polyuria or polydipsia.  Marland Kitchen   Physical Examination Filed Vitals:   03/26/15 1311  BP: 196/107  Pulse: 80   Filed Vitals:   03/26/15 1305  Height: 6\' 2"  (1.88 m)  Weight: 169 lb (76.658 kg)   Manual BP: 170/100 Gen: resting comfortably, no acute distress HEENT: no scleral icterus, pupils equal round and reactive, no palptable cervical adenopathy,  CVL RRR,  no m/r/g, no JVD Resp: Clear to auscultation bilaterally GI: abdomen is soft, non-tender, non-distended, normal bowel sounds, no hepatosplenomegaly MSK: extremities are warm, no edema.  Skin: warm, no rash Neuro:  no focal deficits Psych: appropriate affect   Diagnostic Studies 04/2014 Echo LVEF 55-60%, no significant valvular pathology     Assessment and Plan  1. Diabetic autonomic neuropathy - severe symptomatic orthostatic hypotension - symptoms  reasonablly controlled on current therapy. He does have hypertension laying or sitting, but continues to have 30 point drop in SBP with standing even on current meds - suspect his headaches maybe related to significant supine hypertension, we will try decreasing midodrine to 12.5mg  tid, continue florinef at 0.2mg  daily. - he will submit bp log in 1 week   F/u 3 months   Arnoldo Lenis, M.D.

## 2015-03-26 NOTE — Patient Instructions (Addendum)
Your physician recommends that you schedule a follow-up appointment in: 3 MONTHS WITH DR. [place]  Your physician recommends that you continue on your current medications as directed. Please refer to the Current Medication list given to you today.  Your physician has requested that you regularly monitor and record your blood pressure readings at home FOR 1 [place]. Please use the same machine at the same time of day to check your readings and record them to bring to your follow-up visit.  Your physician recommends that you return for lab work Cort[place]   Thank you for choosing [facility]!!

## 2015-04-02 ENCOUNTER — Telehealth: Payer: Self-pay | Admitting: *Deleted

## 2015-04-02 NOTE — Telephone Encounter (Signed)
Please let patient know we received his bp's, please call and see how his symptoms of dizziness have been doing.  Zandra Abts MD

## 2015-04-02 NOTE — Telephone Encounter (Signed)
Pt faxed BP log as requested: Will forward to Dr. Harl Bowie  03/27/15  177/102 HR 84 03/28/15  165/95   103 03/29/15  167/97   81 03/30/15  152/92   113 03/31/15  178/101  103 04/01/15  129/85   83

## 2015-04-02 NOTE — Telephone Encounter (Signed)
Headaches may be due to high blood pressures. Would try decreasing his midodrine to 10mg  tid, do not change his prescription, just have him try for next week and then call and update on bp's and symptoms   Zandra Abts MD

## 2015-04-02 NOTE — Telephone Encounter (Signed)
Pt voices understanding of med change, did not change rx. Will f/u in 1 week and pt will call sooner if needed.

## 2015-04-02 NOTE — Telephone Encounter (Signed)
Pt says dizziness is better and that  has constant headache. Pt has been taking midodrine 12.5 MG TID. Will forward

## 2015-04-16 ENCOUNTER — Telehealth: Payer: Self-pay | Admitting: *Deleted

## 2015-04-16 NOTE — Telephone Encounter (Signed)
BP log received and on Dr. Nelly Laurence desk for review.

## 2015-04-30 ENCOUNTER — Telehealth: Payer: Self-pay

## 2015-04-30 NOTE — Telephone Encounter (Signed)
Patient states he will weigh self daily and take lasix as needed for eight gain over 2-3 lbs overnight

## 2015-04-30 NOTE — Telephone Encounter (Signed)
-----   Message from Arnoldo Lenis, MD sent at 04/22/2015  9:28 AM EDT ----- Disregard the last message, I see your updated info on his symptoms. Has he tried his prn lasix for the fluid retention?  Zandra Abts MD ----- Message -----    From: Massie Maroon, CMA    Sent: 04/21/2015  12:42 PM      To: Arnoldo Lenis, MD  Pt returned called to say dizziness has improved and has some occasional headaches. Says he has been retaining fluid.   Staci ----- Message -----    From: Arnoldo Lenis, MD    Sent: 04/17/2015   3:40 PM      To: Staci T Ashworth, CMA  BP log reviwed, still with high bp at times as expected on the midorine. Can you please call and see how his dizziness and headaches are doing?   Zandra Abts MD

## 2015-05-07 ENCOUNTER — Telehealth: Payer: Self-pay | Admitting: *Deleted

## 2015-05-07 NOTE — Telephone Encounter (Signed)
In June we had him at 170, can you confirm he has truly gained 20 lbs. How bad is any swelling he may be having. He needs to see me next week if I have any openings if this is true  Zandra Abts MD

## 2015-05-07 NOTE — Telephone Encounter (Signed)
Pt sent weight for the last week as follows:  Will forward to Dr. Harl Bowie  05/01/15 - 190lbs  05/02/15 - 189lbs 05/03/15 - 192lbs 05/04/15 - 192lbs 05/05/15 - 195lbs 05/06/15- 195lbs 05/07/15 - 194lbs

## 2015-05-07 NOTE — Telephone Encounter (Signed)
Pt confirmed that he has gained 20 lbs in over a month. Says he does have increased fluid. Scheduled appt 05/13/15 @ 340pm with Dr. Harl Bowie. Will forward as FYI. Pt has been taking lasix "almost everyday"

## 2015-05-13 ENCOUNTER — Ambulatory Visit (INDEPENDENT_AMBULATORY_CARE_PROVIDER_SITE_OTHER): Payer: PRIVATE HEALTH INSURANCE | Admitting: Cardiology

## 2015-05-13 ENCOUNTER — Encounter: Payer: Self-pay | Admitting: Cardiology

## 2015-05-13 VITALS — BP 200/111 | HR 86 | Ht 74.0 in | Wt 206.4 lb

## 2015-05-13 DIAGNOSIS — G909 Disorder of the autonomic nervous system, unspecified: Secondary | ICD-10-CM

## 2015-05-13 NOTE — Progress Notes (Signed)
Patient ID: Torren Falsetti., male   DOB: [birth year], 49 y.o.   MRN: [id number]     Clinical Summary Mr. Kalo is a 49 y.o.male seen today for follow up of the following medical problems.     1. Autonomic insufficiency - hx of poorly controlled DM previously with HgbA1c of 14. - severe autonomic insufficiency by orthostatics previously in clinic. He has had prior orthostatic syncopal episodes - therapy has been complicated by medication side effects.  - developed significant LE edema on florinef 0.4mg  daily, dose was cut back and currently on 0.2mg  however LE edema has returned with nearly 30 lbs weight gain. He notes some orthopnea. Takes lasix prn.  - currently on midodrine 10mg  tid, has supine HTN with headaches at times. .  - he maintains good oral intake with powerades, water, and increased sodium intake. Compliant with compression stockings.  - dizziness is fairly well controlled,  Past Medical History  Diagnosis Date  . Type 2 diabetes mellitus   . Peripheral arterial disease      Allergies  Allergen Reactions  . Penicillins      Current Outpatient Prescriptions  Medication Sig Dispense Refill  . aspirin 81 MG tablet Take 81 mg by mouth daily.    . Canagliflozin-Metformin HCl (INVOKAMET) 150-500 MG TABS Take 1 tablet by mouth daily.     . Cyanocobalamin (VITAMIN B-12 CR PO) Take 1 tablet by mouth daily.    . fludrocortisone (FLORINEF) 0.1 MG tablet Take 2 tablets (0.2 mg total) by mouth daily. 248 tablet 3  . furosemide (LASIX) 20 MG tablet Take 1 tab daily as needed for swelling 90 tablet 3  . glimepiride (AMARYL) 4 MG tablet Take 2 mg by mouth every morning.    . midodrine (PROAMATINE) 5 MG tablet Take 3 tablets (15 mg total) by mouth 3 (three) times daily with meals. 558 tablet 3  . Multiple Vitamin (MULTIVITAMIN) tablet Take 1 tablet by mouth daily.     No current facility-administered medications for this visit.     No past surgical history on  file.   Allergies  Allergen Reactions  . Penicillins       Family History  Problem Relation Age of Onset  . Leukemia Father   . Thyroid cancer Mother      Social History Mr. Klebe reports that he has never smoked. He has never used smokeless tobacco. Mr. Bores has no alcohol history on file.   Review of Systems CONSTITUTIONAL: No weight loss, fever, chills, weakness or fatigue.  HEENT: Eyes: No visual loss, blurred vision, double vision or yellow sclerae.No hearing loss, sneezing, congestion, runny nose or sore throat.  SKIN: No rash or itching.  CARDIOVASCULAR: no chest pain, no palpitations RESPIRATORY: No shortness of breath, cough or sputum.  GASTROINTESTINAL: No anorexia, nausea, vomiting or diarrhea. No abdominal pain or blood.  GENITOURINARY: No burning on urination, no polyuria NEUROLOGICAL: No headache, dizziness, syncope, paralysis, ataxia, numbness or tingling in the extremities. No change in bowel or bladder control.  MUSCULOSKELETAL: No muscle, back pain, joint pain or stiffness.  LYMPHATICS: No enlarged nodes. No history of splenectomy.  PSYCHIATRIC: No history of depression or anxiety.  ENDOCRINOLOGIC: No reports of sweating, cold or heat intolerance. No polyuria or polydipsia.  Marland Kitchen   Physical Examination Filed Vitals:   05/13/15 1538  BP: 200/111  Pulse: 86   Filed Vitals:   05/13/15 1533  Height: 6\' 2"  (1.88 m)  Weight: 206 lb 6.4 oz (93.622 kg)  Gen: resting comfortably, no acute distress HEENT: no scleral icterus, pupils equal round and reactive, no palptable cervical adenopathy,  CV: RRR, no m/r/g, no JVD Resp: Clear to auscultation bilaterally GI: abdomen is soft, non-tender, non-distended, normal bowel sounds, no hepatosplenomegaly MSK: extremities are warm, 1+ bilateral LE edema Skin: warm, no rash Neuro:  no focal deficits Psych: appropriate affect   Diagnostic Studies  04/2014 Echo LVEF 55-60%, no significant valvular  pathology   Assessment and Plan   1. Diabetic autonomic neuropathy - severe symptomatic orthostatic hypotension - management has been complicated by medication side effects. Severe fluid retention and weight fain on florinef, will decrease to 0.1mg  daily, use lasix prn only. Supine HTN and headaches at times with midodrine, headaches are currently tolerable - he is not orthostatic by vitals today, but clearly volume overloaded with 30 lbs weight gain - given the severity of his disease and complicated management will refer him to the autonomic dysfunction specialty clinic at [facility] pending insurance acceptance.      Arnoldo Lenis, M.D.

## 2015-05-13 NOTE — Patient Instructions (Signed)
Your physician recommends that you schedule a follow-up appointment IN September THAT IS ALREADY SCHEDULED   Your physician recommends that you continue on your current medications as directed. Please refer to the Current Medication list given to you today.  You have been referred to [place]   Thank you for choosing [facility]!!

## 2015-05-26 ENCOUNTER — Telehealth: Payer: Self-pay | Admitting: Cardiology

## 2015-05-26 NOTE — Telephone Encounter (Signed)
Calling about status of referral. Stated that he had a missed call from our office and didn't know if it was in reference to that or not

## 2015-06-02 ENCOUNTER — Telehealth: Payer: Self-pay | Admitting: *Deleted

## 2015-06-02 NOTE — Telephone Encounter (Signed)
[facility] contacted office that states they are unable to provide services in a timely manner and suggests we provide pt with alternate plan of care. Will forward to Dr. Harl Bowie for further suggestions.

## 2015-06-02 NOTE — Telephone Encounter (Signed)
I spoke with patient and relayed dr Lauris Poag

## 2015-06-02 NOTE — Telephone Encounter (Signed)
Ok, please let patient know and I will look into alternative clinics.  Zandra Abts MD

## 2015-06-30 ENCOUNTER — Ambulatory Visit: Payer: PRIVATE HEALTH INSURANCE | Admitting: Cardiology

## 2015-07-02 ENCOUNTER — Encounter: Payer: Self-pay | Admitting: Cardiology

## 2015-07-02 ENCOUNTER — Encounter: Payer: Self-pay | Admitting: *Deleted

## 2015-07-02 ENCOUNTER — Ambulatory Visit (INDEPENDENT_AMBULATORY_CARE_PROVIDER_SITE_OTHER): Payer: PRIVATE HEALTH INSURANCE | Admitting: Cardiology

## 2015-07-02 VITALS — BP 176/94 | HR 84 | Ht 74.0 in | Wt 182.8 lb

## 2015-07-02 DIAGNOSIS — G909 Disorder of the autonomic nervous system, unspecified: Secondary | ICD-10-CM

## 2015-07-02 DIAGNOSIS — Z139 Encounter for screening, unspecified: Secondary | ICD-10-CM

## 2015-07-02 MED ORDER — FUROSEMIDE 40 MG PO TABS: 40.0000 mg | ORAL_TABLET | Freq: Every day | ORAL | Status: DC

## 2015-07-02 NOTE — Patient Instructions (Signed)
Your physician recommends that you schedule a follow-up appointment in: 3 months with Dr. Harl Bowie  Your physician recommends that you continue on your current medications as directed. Please refer to the Current Medication list given to you today.  You have been referred to Dr. Caryl Comes   Thank you for choosing [facility]!!

## 2015-07-02 NOTE — Progress Notes (Signed)
Patient ID: David Rose., male   DOB: Oct 03, 1966, 49 y.o.   MRN: [id number]     Clinical Summary Mr. Hendriks is a 49 y.o.male seen today for follow up of the following medical problems.  1. Autonomic insufficiency - hx of poorly controlled DM previously with HgbA1c of 14. - severe autonomic insufficiency by orthostatics previously in clinic. He has had prior orthostatic syncopal episodes - therapy has been complicated by medication side effects.  - developed significant LE edema and weight gain on florinef at 0.2mg  daily. Currently on 0.1mg  daily, does require prn lasix at times for volume overload. At one point had 20+ lbs weight gain.  - currently on midodrine 10mg  tid, has supine HTN with headaches at times. .  - he maintains good oral intake with powerades, water, and increased sodium intake. Compliant with compression stockings.   - since last visit weight has trended down, edema and breathing are improving. Notes some dizziness at times, occaisonal headaches that are overall tolerable.    Past Medical History  Diagnosis Date  . Type 2 diabetes mellitus   . Peripheral arterial disease      Allergies  Allergen Reactions  . Penicillins      Current Outpatient Prescriptions  Medication Sig Dispense Refill  . aspirin 81 MG tablet Take 81 mg by mouth daily.    . Canagliflozin-Metformin HCl (INVOKAMET) 150-500 MG TABS Take 1 tablet by mouth daily.     . Cyanocobalamin (VITAMIN B-12 CR PO) Take 1 tablet by mouth daily.    . fludrocortisone (FLORINEF) 0.1 MG tablet Take 2 tablets (0.2 mg total) by mouth daily. 248 tablet 3  . furosemide (LASIX) 20 MG tablet Take 40 mg by mouth daily.    Marland Kitchen glimepiride (AMARYL) 4 MG tablet Take 2 mg by mouth every morning.    . midodrine (PROAMATINE) 5 MG tablet Take 10 mg by mouth 3 (three) times daily with meals.    . Multiple Vitamin (MULTIVITAMIN) tablet Take 1 tablet by mouth daily.     No current facility-administered medications  for this visit.     No past surgical history on file.   Allergies  Allergen Reactions  . Penicillins       Family History  Problem Relation Age of Onset  . Leukemia Father   . Thyroid cancer Mother      Social History Mr. Browder reports that he has never smoked. He has never used smokeless tobacco. Mr. Mcneilly has no alcohol history on file.   Review of Systems CONSTITUTIONAL: No weight loss, fever, chills, weakness or fatigue.  HEENT: Eyes: No visual loss, blurred vision, double vision or yellow sclerae.No hearing loss, sneezing, congestion, runny nose or sore throat.  SKIN: No rash or itching.  CARDIOVASCULAR: no chest pain, no palpitations RESPIRATORY: No shortness of breath, cough or sputum.  GASTROINTESTINAL: No anorexia, nausea, vomiting or diarrhea. No abdominal pain or blood.  GENITOURINARY: No burning on urination, no polyuria NEUROLOGICAL: dizziness, headaches  MUSCULOSKELETAL: No muscle, back pain, joint pain or stiffness.  LYMPHATICS: No enlarged nodes. No history of splenectomy.  PSYCHIATRIC: No history of depression or anxiety.  ENDOCRINOLOGIC: No reports of sweating, cold or heat intolerance. No polyuria or polydipsia.  Marland Kitchen   Physical Examination Filed Vitals:   07/02/15 1129  BP: 176/94  Pulse: 84   Filed Vitals:   07/02/15 1129  Height: 6\' 2"  (1.88 m)  Weight: 182 lb 12.8 oz (82.918 kg)    Gen: resting comfortably, no  acute distress HEENT: no scleral icterus, pupils equal round and reactive, no palptable cervical adenopathy,  CV: RRR, no m/r/g, no JVD Resp: Clear to auscultation bilaterally GI: abdomen is soft, non-tender, non-distended, normal bowel sounds, no hepatosplenomegaly MSK: extremities are warm, 1+ bilateral LE edema Skin: warm, no rash Neuro:  no focal deficits Psych: appropriate affect   Diagnostic Studies 04/2014 Echo LVEF 55-60%, no significant valvular pathology    Assessment and Plan   1. Diabetic autonomic  neuropathy - severe symptomatic orthostatic hypotension - management has been complicated by medication side effects. Severe fluid retention and weight fain on florinef, did not tolerate 0.2mg , will continue 0.1mg  daily - weight trending down, requires prn lasix at times for fluid overload - orthostatics negative in clinic today. Overall his dizziness, edema, and supine HTN/Headaches are mild currently and overall tolerable. Will continue current meds - will refer to EP for assistance with management due to complicated course    Arnoldo Lenis, M.D.

## 2015-07-16 ENCOUNTER — Encounter: Payer: Self-pay | Admitting: Internal Medicine

## 2015-07-16 ENCOUNTER — Ambulatory Visit (INDEPENDENT_AMBULATORY_CARE_PROVIDER_SITE_OTHER): Payer: PRIVATE HEALTH INSURANCE | Admitting: Internal Medicine

## 2015-07-16 VITALS — BP 198/115 | HR 84 | Ht 74.0 in | Wt 184.2 lb

## 2015-07-16 DIAGNOSIS — G909 Disorder of the autonomic nervous system, unspecified: Secondary | ICD-10-CM | POA: Diagnosis not present

## 2015-07-16 MED ORDER — PYRIDOSTIGMINE BROMIDE 60 MG PO TABS: 30.0000 mg | ORAL_TABLET | Freq: Three times a day (TID) | ORAL | Status: DC

## 2015-07-16 NOTE — Patient Instructions (Addendum)
Medication Instructions:  Your physician has recommended you make the following change in your medication:  1) DECREASE Florinef to 0.1 mg daily for 2 weeks, then stop medication. 2) WEAN off you Furosemide at same time as weaning off Florinef 3) START Mestinon 30 mg twice a day.  Start this medication after you have stopped Florinef 4) CHANGE Midodrine to take at 7 am, 11am, and 3pm  Labwork: None ordered  Testing/Procedures: None ordered  Follow-Up: Your physician recommends that you schedule a follow-up appointment in: 8 weeks with Dr. Caryl Comes.  Any Other Special Instructions Will Be Listed Below (If Applicable). Start using an abdominal binder as discussed with Dr. Caryl Comes.  Raise head of the bed as instructed.  Thank you for choosing [facility]!!

## 2015-07-16 NOTE — Progress Notes (Signed)
ELECTROPHYSIOLOGY CONSULT NOTE  Patient ID: David Rose., MRN: [id number], DOB/AGE: 1966/03/20 49 y.o. Admit date: (Not on file) Date of Consult: 07/16/2015  Primary Physician: Glenda Chroman., MD Primary Cardiologist: JBr  Chief Complaint: orhtostatic hypotension  HPI Xzayvion Flaim. is a 49 y.o. male  With poorly controlled diabetes and secondary autonomic insufficiency and orthostatic intolerance  He has significant supine hypertension and while he is dexribed as having OI, review of Orthostatic VS over the last year showed 8/15  BP sys 132>>73, but all measurement is 2016 are hypertensive and stable   He was started on mitodrine and florinef about a year ago.  In the wake of that he has had no recurrent syncope; however, his blood pressures have been persistently in the 180-200 range.  He denies problems with bowel function dry mouth or dry eyes or bladder function.  His hemoglobin A1c is much improved now in the less than 6 range  He denies chest discomfort. He does have some dyspnea on exertion has some orthopnea. This correlates with volume overload as noted  Echo was reviewed EF normal and surprisingly NORMAL wall thickness     Past Medical History  Diagnosis Date  . Type 2 diabetes mellitus   . Peripheral arterial disease       Surgical History: History reviewed. No pertinent past surgical history.   Home Meds: Prior to Admission medications   Medication Sig Start Date End Date Taking? Authorizing Provider  aspirin 81 MG tablet Take 81 mg by mouth daily.    Historical Provider, MD  Canagliflozin-Metformin HCl (INVOKAMET) 150-500 MG TABS Take 1 tablet by mouth daily.     Historical Provider, MD  Cyanocobalamin (VITAMIN B-12 CR PO) Take 1 tablet by mouth daily.    Historical Provider, MD  fludrocortisone (FLORINEF) 0.1 MG tablet Take 2 tablets (0.2 mg total) by mouth daily. 02/25/15   Arnoldo Lenis, MD  furosemide (LASIX) 40 MG tablet Take 1 tablet (40 mg  total) by mouth daily. 07/02/15   Arnoldo Lenis, MD  glimepiride (AMARYL) 4 MG tablet Take 2 mg by mouth every morning.    Historical Provider, MD  midodrine (PROAMATINE) 5 MG tablet Take 10 mg by mouth 3 (three) times daily with meals.    Historical Provider, MD  Multiple Vitamin (MULTIVITAMIN) tablet Take 1 tablet by mouth daily.    Historical Provider, MD      Allergies:  Allergies  Allergen Reactions  . Penicillins     Social History   Social History  . Marital Status: Married    Spouse Name: N/A  . Number of Children: N/A  . Years of Education: N/A   Occupational History  . Not on file.   Social History Main Topics  . Smoking status: Never Smoker   . Smokeless tobacco: Never Used  . Alcohol Use: Not on file  . Drug Use: Not on file  . Sexual Activity: Not on file   Other Topics Concern  . Not on file   Social History Narrative     Family History  Problem Relation Age of Onset  . Leukemia Father   . Thyroid cancer Mother      ROS:  Please see the history of present illness.     All other systems reviewed and negative.    Physical Exam: Blood pressure 198/115, pulse 84, height 6\' 2"  (1.88 m), weight 184 lb 3.2 oz (83.553 kg), SpO2 98 %. General: Well developed, well  nourished male in no acute distress. Head: Normocephalic, atraumatic, sclera non-icteric, no xanthomas, nares are without discharge. EENT: normal Lymph Nodes:  none Back: without scoliosis/kyphosis , no CVA tendersness Neck: Negative for carotid bruits. JVD not elevated. Lungs: Clear bilaterally to auscultation without wheezes, rales, or rhonchi. Breathing is unlabored. Heart: RRR with S1 S2 +S4. No  murmur , rubs, or gallops appreciated. Abdomen: Soft, non-tender, non-distended with normoactive bowel sounds. No hepatomegaly. No rebound/guarding. No obvious abdominal masses. Msk:  Strength and tone appear normal for age. Extremities: No clubbing or cyanosis. 1+  edema.  Distal pedal pulses  are 2+ and equal bilaterally. Skin: Warm and Dry Neuro: Alert and oriented X 3. CN III-XII intact Grossly normal sensory and motor function . Psych:  Responds to questions appropriately with a normal affect.      Labs: Labs from the computer scan 6/16 were reviewed    Miscellaneous No results found for: DDIMER  Radiology/Studies:  No results found.  EKG: From 07/02/15 was reviewed. Sinus rhythm at 85 Intervals 18/10/36  Assessment and Plan:   Orthostatic intolerance  Iatrogenic hypertension  Diabetes with autonomic insufficiency   We discussed extensively the issues of dysautonomia, the physiology of orthstasis and positional stress.  We discussed the role of salt and water repletion, the importance of exercise, often needing to be started in the recumbent position, and the awareness of triggers and the role of ambient heat and dehydration  We further discussed the problems with Florinef in that it is 24-hour day acting drug and aggravate supine hypertension and the importance of timely use of ProAmatine so that it is out of the system prior to becoming recumbent.  We discussed the role of Mestinon to augment neurotransmitter persisted upon standing.  We reviewed nonpharmacological measures.  Based on the above will therefore 1 wean off his Florinef 2-wean off his Lasix at the same time 3-having take his ProAmatine at 05/03/1499 hrs. 4-(Mestinon at 30 mg twice daily 5-raise the head of the  bed 6 inches 6-begin the use of an abdominal binder 7-begin aerobic exercise and we discussed the potential risks of dizziness upon cessation of exercise 8-consider showering at night     Virl Axe

## 2015-07-20 ENCOUNTER — Telehealth: Payer: Self-pay | Admitting: Internal Medicine

## 2015-07-20 MED ORDER — PYRIDOSTIGMINE BROMIDE 60 MG PO TABS: 30.0000 mg | ORAL_TABLET | Freq: Two times a day (BID) | ORAL | Status: DC

## 2015-07-20 NOTE — Telephone Encounter (Signed)
New problem    Pt need to know the correct dosage to take for Mestinon... paperwork says 30mg  2x's a day and the prescription form [facility] say take 1/2 tablet 30mg  totally by mouth 3x's a day. Please advise

## 2015-07-20 NOTE — Telephone Encounter (Signed)
I called and spoke with the patient. I clarified with him that he should take Mestinon 60 mg 1/2 tablet (30 mg) by mouth twice daily- starting in 2 weeks after he weans off florinef. I advised I will notify the pharmacy to update his RX. He verbalizes understanding.

## 2015-08-05 ENCOUNTER — Other Ambulatory Visit: Payer: Self-pay | Admitting: *Deleted

## 2015-08-05 MED ORDER — MIDODRINE HCL 5 MG PO TABS: 10.0000 mg | ORAL_TABLET | Freq: Three times a day (TID) | ORAL | Status: DC

## 2015-09-04 ENCOUNTER — Ambulatory Visit (INDEPENDENT_AMBULATORY_CARE_PROVIDER_SITE_OTHER): Payer: PRIVATE HEALTH INSURANCE | Admitting: Ophthalmology

## 2015-09-09 ENCOUNTER — Ambulatory Visit (INDEPENDENT_AMBULATORY_CARE_PROVIDER_SITE_OTHER): Payer: PRIVATE HEALTH INSURANCE | Admitting: Ophthalmology

## 2015-09-09 DIAGNOSIS — H35033 Hypertensive retinopathy, bilateral: Secondary | ICD-10-CM | POA: Diagnosis not present

## 2015-09-09 DIAGNOSIS — E11311 Type 2 diabetes mellitus with unspecified diabetic retinopathy with macular edema: Secondary | ICD-10-CM

## 2015-09-09 DIAGNOSIS — H43813 Vitreous degeneration, bilateral: Secondary | ICD-10-CM | POA: Diagnosis not present

## 2015-09-09 DIAGNOSIS — I10 Essential (primary) hypertension: Secondary | ICD-10-CM

## 2015-09-09 DIAGNOSIS — E113313 Type 2 diabetes mellitus with moderate nonproliferative diabetic retinopathy with macular edema, bilateral: Secondary | ICD-10-CM

## 2015-09-15 ENCOUNTER — Ambulatory Visit (INDEPENDENT_AMBULATORY_CARE_PROVIDER_SITE_OTHER): Payer: PRIVATE HEALTH INSURANCE | Admitting: Internal Medicine

## 2015-09-15 ENCOUNTER — Encounter: Payer: Self-pay | Admitting: Internal Medicine

## 2015-09-15 VITALS — BP 186/111 | HR 84 | Ht 74.0 in | Wt 168.8 lb

## 2015-09-15 DIAGNOSIS — R2681 Unsteadiness on feet: Secondary | ICD-10-CM | POA: Diagnosis not present

## 2015-09-15 DIAGNOSIS — G909 Disorder of the autonomic nervous system, unspecified: Secondary | ICD-10-CM

## 2015-09-15 NOTE — Patient Instructions (Addendum)
Medication Instructions: 1) Stop Mestinon 2) Decrease Midodrine to 5 mg one tablet by mouth three times a day  Labwork: - none  Procedures/Testing: - none  Follow-Up: - You have been referred to : [facility]- [facility]  - Your physician wants you to follow-up in: 6 months with Dr. Caryl Comes. You will receive a reminder letter in the mail two months in advance. If you don't receive a letter, please call our office to schedule the follow-up appointment.  Any Additional Special Instructions Will Be Listed Below (If Applicable). - Wii [employer]

## 2015-09-15 NOTE — Progress Notes (Signed)
      Patient Care Team: Glenda Chroman, MD as PCP - General (Internal Medicine)   HPI  David Rose. is a 49 y.o. male Seen  In follouwp for orthostatic inotlerance presumed 2/2 autonomic insufficiency from DM  Seen in 9/16 with rec of  1 wean off his Florinef 2-wean off his Lasix at the same time 3-having take his ProAmatine at 05/03/1499 hrs. 4- Mestinon at 30 mg twice daily 5-raise the head of the  bed 6 inches 6-begin the use of an abdominal binder 7-begin aerobic exercise and we discussed the potential risks of dizziness upon cessation of exercise 8-consider showering at night   He continues with dizziness. He has positional and is upright dizziness, he has positional dizziness that is with recumbency and he had a third dizziness which is associated with a wide-based gait and gait instability.  He has developed diarrhea on the Mestinon. His blood pressures and elevated on the ProAmatine. He is wearing his abdominal binder  Past Medical History  Diagnosis Date  . Type 2 diabetes mellitus ([place])   . Peripheral arterial disease ([place])     No past surgical history on file.  Current Outpatient Prescriptions  Medication Sig Dispense Refill  . aspirin 81 MG tablet Take 81 mg by mouth daily.    . Canagliflozin-Metformin HCl (INVOKAMET) 150-500 MG TABS Take 1 tablet by mouth daily.     . Cyanocobalamin (VITAMIN B-12 CR PO) Take 1 tablet by mouth daily.    Marland Kitchen glimepiride (AMARYL) 4 MG tablet Take 2 mg by mouth every morning.    . midodrine (PROAMATINE) 5 MG tablet Take 2 tablets (10 mg total) by mouth 3 (three) times daily with meals. 540 tablet 3  . Multiple Vitamin (MULTIVITAMIN) tablet Take 1 tablet by mouth daily.    Marland Kitchen pyridostigmine (MESTINON) 60 MG tablet Take 0.5 tablets (30 mg total) by mouth 2 (two) times daily. 30 tablet 3   No current facility-administered medications for this visit.    Allergies  Allergen Reactions  . Penicillins       Review of Systems  negative except from HPI and PMH  Physical Exam BP 186/111 mmHg  Pulse 84  Ht 6\' 2"  (1.88 m)  Wt 168 lb 12.8 oz (76.567 kg)  BMI 21.66 kg/m2 Well developed and well nourished in no acute distress HENT normal E scleral and icterus clear Neck Supple JVP flat; carotids brisk and full Clear to ausculation Regular rate and rhythm, no murmurs gallops or rub Soft with active bowel sounds No clubbing cyanosis  Edema Alert and oriented, grossly normal motor and sensory function but he has a broad-based gait and a positive Romberg Skin Warm and Dry    Assessment and  Plan  HTN  Dysautonomia  Diabetes  The patient's blood pressure has been significantly elevated. He continues with dizziness of 2 additional types. He has gait instability with a wide-based gait and a positive Romberg and he also has positional dizziness related to changes in head position and becoming recumbent. Because of these will ask him to see neurology. I've also suggested that he use the Wii fit to work on balance and feedback  Given his diarrhea we will stop his Mestinon. Given his hypertension we'll decrease his ProAmatine from 10--5 and if it remains elevated after couple of weeks she will decrease it further to 2.5.

## 2015-09-23 ENCOUNTER — Other Ambulatory Visit: Payer: Self-pay

## 2015-10-01 ENCOUNTER — Ambulatory Visit: Payer: PRIVATE HEALTH INSURANCE | Admitting: Cardiology

## 2015-11-02 ENCOUNTER — Ambulatory Visit: Payer: PRIVATE HEALTH INSURANCE | Admitting: Neurology

## 2015-11-11 ENCOUNTER — Encounter: Payer: Self-pay | Admitting: Neurology

## 2015-11-11 ENCOUNTER — Ambulatory Visit (INDEPENDENT_AMBULATORY_CARE_PROVIDER_SITE_OTHER): Payer: PRIVATE HEALTH INSURANCE | Admitting: Neurology

## 2015-11-11 VITALS — BP 174/94 | HR 88 | Ht 74.0 in | Wt 169.0 lb

## 2015-11-11 DIAGNOSIS — H8110 Benign paroxysmal vertigo, unspecified ear: Secondary | ICD-10-CM

## 2015-11-11 DIAGNOSIS — G901 Familial dysautonomia [Riley-Day]: Secondary | ICD-10-CM

## 2015-11-11 DIAGNOSIS — E1143 Type 2 diabetes mellitus with diabetic autonomic (poly)neuropathy: Secondary | ICD-10-CM

## 2015-11-11 DIAGNOSIS — R030 Elevated blood-pressure reading, without diagnosis of hypertension: Secondary | ICD-10-CM

## 2015-11-11 DIAGNOSIS — G909 Disorder of the autonomic nervous system, unspecified: Secondary | ICD-10-CM | POA: Diagnosis not present

## 2015-11-11 DIAGNOSIS — IMO0001 Reserved for inherently not codable concepts without codable children: Secondary | ICD-10-CM

## 2015-11-11 NOTE — Progress Notes (Signed)
Chart forwarded.  

## 2015-11-11 NOTE — Patient Instructions (Signed)
Consider going to vestibular rehabilitation to address the vertigo I don't think the dizziness is related to anything in the brain

## 2015-11-11 NOTE — Progress Notes (Signed)
NEUROLOGY CONSULTATION NOTE  David Rose. MRN: [id number] DOB: 11-15-1965  Referring provider: Dr. Caryl Comes  Primary care provider: Dr. Woody Seller  Reason for consult:  dizziness  HISTORY OF PRESENT ILLNESS: David Rose is a 50 year old left-handed male with diabetes with autonomic insufficiency who presents for ataxia.  History obtained by patient, his wife and cardiology note.  Labs reviewed.  He has autonomic insufficiency related to his diabetes.  He has had uncontrolled diabetes for 10 years.  2 years ago, he began having episodes of syncope.  He was found to have Hgb A1c of 14.  Glycemic control has since improved.  Recent Hgb A1c was reportedly less than 6%.  Over the past year, he has been treated for orthostatic hypotension.  He has been on midodrine and Florinef for a year.  However, Florinef was weaned off and midodrine decreased due to hypertension and fluid overload.  He was briefly on Mestinon from September to November, which was discontinued due to diarrhea.  He describes brief episodes of spinning sensation, lasting seconds, which are triggered with quick movements or change in head or body position.  There is no associated diplopia or focal numbness or weakness.  When he is standing or walking, he will sometimes have episodes of lightheadedness, like he is going to pass out.  He takes his time when standing up.  He sleeps in a recliner now.  He uses an abdominal binder and compression stockings.  Labs from earlier this year revealed unremarkable cortisol, TSH, and BMP.    PAST MEDICAL HISTORY: Past Medical History  Diagnosis Date  . Type 2 diabetes mellitus ([place])   . Peripheral arterial disease ([place])   . Hypertension     PAST SURGICAL HISTORY: No past surgical history on file.  MEDICATIONS: Current Outpatient Prescriptions on File Prior to Visit  Medication Sig Dispense Refill  . aspirin 81 MG tablet Take 81 mg by mouth daily.    . Canagliflozin-Metformin HCl  (INVOKAMET) 150-500 MG TABS Take 1 tablet by mouth daily.     . Cyanocobalamin (VITAMIN B-12 CR PO) Take 1 tablet by mouth daily.    Marland Kitchen glimepiride (AMARYL) 4 MG tablet Take 2 mg by mouth every morning.    . midodrine (PROAMATINE) 5 MG tablet Take 1 tablet (5 mg total) by mouth 3 (three) times daily with meals.    . Multiple Vitamin (MULTIVITAMIN) tablet Take 1 tablet by mouth daily.     No current facility-administered medications on file prior to visit.    ALLERGIES: Allergies  Allergen Reactions  . Penicillins     FAMILY HISTORY: Family History  Problem Relation Age of Onset  . Leukemia Father   . Thyroid cancer Mother     SOCIAL HISTORY: Social History   Social History  . Marital Status: Married    Spouse Name: N/A  . Number of Children: N/A  . Years of Education: N/A   Occupational History  . Not on file.   Social History Main Topics  . Smoking status: Never Smoker   . Smokeless tobacco: Never Used  . Alcohol Use: Not on file  . Drug Use: Not on file  . Sexual Activity: Not on file   Other Topics Concern  . Not on file   Social History Narrative   Pt has 1 year of college.     REVIEW OF SYSTEMS: Constitutional: fatigue Eyes: No visual changes, double vision, eye pain Ear, nose and throat: No hearing loss,  ear pain, nasal congestion, sore throat Cardiovascular: No chest pain, palpitations Respiratory:  No shortness of breath at rest or with exertion, wheezes GastrointestinaI: No nausea, vomiting, diarrhea, abdominal pain, fecal incontinence Genitourinary:  No dysuria, urinary retention or frequency Musculoskeletal:  No neck pain, back pain Integumentary: No rash, pruritus, skin lesions Neurological: as above Psychiatric: No depression, insomnia, anxiety Endocrine: fatigue Hematologic/Lymphatic:  No anemia, purpura, petechiae. Allergic/Immunologic: no itchy/runny eyes, nasal congestion, recent allergic reactions, rashes  PHYSICAL EXAM: Filed Vitals:     11/11/15 1241  BP: 174/94  Pulse: 88   General: No acute distress.  Patient appears well-groomed.  Thin. Head:  Normocephalic/atraumatic Eyes:  fundi unremarkable, without papilledema. Neck: supple, no paraspinal tenderness, full range of motion Back: No paraspinal tenderness Heart: regular rate and rhythm Lungs: Clear to auscultation bilaterally. Vascular: No carotid bruits. Neurological Exam: Mental status: alert and oriented to person, place, and time, recent and remote memory intact, fund of knowledge intact, attention and concentration intact, speech fluent and not dysarthric, language intact. Cranial nerves: CN I: not tested CN II: Pupils round.  Left pupil slightly larger than right pupil and a little more sluggish to react to light(already known to patient), visual fields intact, fundi unremarkable CN III, IV, VI:  full range of motion, no nystagmus, no ptosis CN V: facial sensation intact CN VII: upper and lower face symmetric CN VIII: hearing intact CN IX, X: gag intact, uvula midline CN XI: sternocleidomastoid and trapezius muscles intact CN XII: tongue midline Bulk & Tone: normal, no fasciculations. Motor:  5/5 throughout  Sensation:  Reduced pinprick and vibration sensation in lower extremities to below the knees. Deep Tendon Reflexes:  1+ throughout, toes downgoing.  Finger to nose testing:  Without dysmetria.  Heel to shin:  Without dysmetria.  Gait:  Wide-based gait, unable to tandem walk.  Romberg positive.  IMPRESSION: Dizziness.  He describes benign paroxysmal positional vertigo as well as dizziness or lightheadedness related to dysautonomia.  I do not suspect an intracranial lesion. Diabetic polyneuropathy, fairly significant, causing gait instability. Diabetic autonomic neuropathy Elevated blood pressure related to dysautonomia  PLAN: 1.  For BPPV, consider vestibular rehab 2.  Continue supportive care in addressing dysautonomia 3. BP followed by  cardiology  4. Follow up as needed.  Thank you for allowing me to take part in the care of this patient.  Metta Clines, DO  CC:  David Axe, MD  David Bears, MD

## 2016-03-28 ENCOUNTER — Ambulatory Visit: Payer: PRIVATE HEALTH INSURANCE | Admitting: Internal Medicine

## 2019-08-21 ENCOUNTER — Telehealth: Payer: Self-pay | Admitting: Cardiology

## 2019-08-21 NOTE — Telephone Encounter (Signed)
Patient has been exposed possibly to Covid and was advised to self quarantine for 14 days - He has no symptoms at all.  Would like to know if he can keep his appointment Friday or needs to reschedule.

## 2019-08-23 ENCOUNTER — Ambulatory Visit: Payer: PRIVATE HEALTH INSURANCE | Admitting: Cardiology

## 2019-09-27 ENCOUNTER — Encounter: Payer: Self-pay | Admitting: *Deleted

## 2019-09-30 ENCOUNTER — Ambulatory Visit: Payer: Self-pay | Admitting: Cardiology

## 2019-10-28 ENCOUNTER — Encounter: Payer: Self-pay | Admitting: *Deleted

## 2019-10-28 ENCOUNTER — Other Ambulatory Visit: Payer: Self-pay

## 2019-10-28 ENCOUNTER — Ambulatory Visit (INDEPENDENT_AMBULATORY_CARE_PROVIDER_SITE_OTHER): Payer: Self-pay | Admitting: Cardiology

## 2019-10-28 VITALS — BP 156/72 | HR 74 | Ht 74.0 in | Wt 188.2 lb

## 2019-10-28 DIAGNOSIS — I10 Essential (primary) hypertension: Secondary | ICD-10-CM | POA: Insufficient documentation

## 2019-10-28 DIAGNOSIS — I5022 Chronic systolic (congestive) heart failure: Secondary | ICD-10-CM

## 2019-10-28 DIAGNOSIS — G909 Disorder of the autonomic nervous system, unspecified: Secondary | ICD-10-CM

## 2019-10-28 MED ORDER — METOPROLOL SUCCINATE ER 25 MG PO TB24: 12.5000 mg | ORAL_TABLET | Freq: Every day | ORAL | 1 refills | Status: DC

## 2019-10-28 NOTE — Patient Instructions (Signed)
Your physician recommends that you schedule a follow-up appointment in: [name] has recommended you make the following change in your medication:   START TOPROL XL 12.5 MG (1/2 TABLET) DAILY   Thank you for choosing [facility]!!

## 2019-10-28 NOTE — Progress Notes (Signed)
Clinical Summary [name] is a 54 y.o.male seen as new patient, last seen in 2014   1. Autonomic insufficiency - hx of poorly controlled DM previously with HgbA1c of 14. - severe autonomic insufficiency by orthostatics previously in clinic. He has had prior orthostatic syncopal episodes - therapy has been complicated by medication side effects.  - developed significant LE edema and weight gain on florinef at 0.2mg  daily. Currently on 0.1mg  daily, does require prn lasix at times for volume overload. At one point had 20+ lbs weight gain.  - currently on midodrine 10mg  tid, has supine HTN with headaches at times. .  - he maintains good oral intake with powerades, water, and increased sodium intake. Compliant with compression stockings.   - no significant symptoms since being managed for this a few years ago. Off midodrine   2. Chronic systolic HF - newly diagnosed in 2018, followed at [facility] previously - - echo 2018 LVEF 25-30% - 05/2019 echo LVEF 30-35% - did not have a cath, I presume due to borderline renal function at the time, had stress test. He has since started dialysis.  05/2017 Lexiscan Nuclear stress - inferolateral scar with mild to moderate ischemia, inferolateral akinesia 05/2019 [facility] lexiscan as reported below, essentially same inferolatearl findings - patient reports he declined AICD through [facility].    - no recent SOB, no significant DOE. Can have some LE edema at times, abdomen - he stopped toprol 100mg   on his own. He reports fatigue when taking. Carvedilol caused weakness.      3. ESRD - on HD 1.5 years.   4. Ascites - prior paracentesis through [facility]  Past Medical History:  Diagnosis Date  . ESRD (end stage renal disease) ([place])   . Hypertension   . Peripheral arterial disease ([place])   . Type 2 diabetes mellitus (HCC)      Allergies  Allergen Reactions  . Penicillins      Current Outpatient Medications  Medication Sig Dispense  Refill  . aspirin 81 MG tablet Take 81 mg by mouth daily.    . Canagliflozin-Metformin HCl (INVOKAMET) 150-500 MG TABS Take 1 tablet by mouth daily.     . Cyanocobalamin (VITAMIN B-12 CR PO) Take 1 tablet by mouth daily.    Marland Kitchen glimepiride (AMARYL) 4 MG tablet Take 2 mg by mouth every morning.    . midodrine (PROAMATINE) 5 MG tablet Take 1 tablet (5 mg total) by mouth 3 (three) times daily with meals.    . Multiple Vitamin (MULTIVITAMIN) tablet Take 1 tablet by mouth daily.     No current facility-administered medications for this visit.     Past Surgical History:  Procedure Laterality Date  . AV FISTULA PLACEMENT Right      Allergies  Allergen Reactions  . Penicillins       Family History  Problem Relation Age of Onset  . Thyroid cancer Mother   . Leukemia Father      Social History Mr. Corfield reports that he has never smoked. He has never used smokeless tobacco. Mr. Alonge has no history on file for alcohol.   Review of Systems CONSTITUTIONAL: No weight loss, fever, chills, weakness or fatigue.  HEENT: Eyes: No visual loss, blurred vision, double vision or yellow sclerae.No hearing loss, sneezing, congestion, runny nose or sore throat.  SKIN: No rash or itching.  CARDIOVASCULAR: per hpi RESPIRATORY: No shortness of breath, cough or sputum.  GASTROINTESTINAL: No anorexia, nausea, vomiting or diarrhea. No abdominal pain  or blood.  GENITOURINARY: No burning on urination, no polyuria NEUROLOGICAL: No headache, dizziness, syncope, paralysis, ataxia, numbness or tingling in the extremities. No change in bowel or bladder control.  MUSCULOSKELETAL: No muscle, back pain, joint pain or stiffness.  LYMPHATICS: No enlarged nodes. No history of splenectomy.  PSYCHIATRIC: No history of depression or anxiety.  ENDOCRINOLOGIC: No reports of sweating, cold or heat intolerance. No polyuria or polydipsia.  Marland Kitchen   Physical Examination Today's Vitals   10/28/19 0830  BP: (!) 156/72    Pulse: 74  SpO2: 100%  Weight: 188 lb 3.2 oz (85.4 kg)  Height: 6\' 2"  (1.88 m)   Body mass index is 24.16 kg/m.  Gen: resting comfortably, no acute distress HEENT: no scleral icterus, pupils equal round and reactive, no palptable cervical adenopathy,  CV: RRR, 2/6 systolic murmur LLSB, no JVD Resp: Clear to auscultation bilaterally GI: abdomen is soft, non-tender, non-distended, normal bowel sounds, no hepatosplenomegaly MSK: extremities are warm, no edema.  Skin: warm, no rash Neuro:  no focal deficits Psych: appropriate affect   Diagnostic Studies  05/2019 echo [facility] ECHOCARDIOGRAPHIC DESCRIPTIONS ----------------------------------------------- AORTIC ROOT     Size: Normal  Dissection: INDETERM FOR DISSECTION  AORTIC VALVE   Leaflets: Tricuspid       Morphology: Normal   Mobility: Fully Mobile  LEFT VENTRICLE                   Anterior: HYPOCONTRACTILE     Size: Normal                 Lateral: HYPOCONTRACTILE  Contraction: MOD GLOBAL DECREASE           Septal: HYPOCONTRACTILE  Closest EF: 35% (Estimated) Calc.EF: 38% (3D)   Apical: HYPOCONTRACTILE   LV masses: No Masses               Inferior: HYPOCONTRACTILE      LVH: None                 Posterior: HYPOCONTRACTILE  LV GLS(AVG): -9.5% Normal Range [ <= -16] Dias.FxClass: RESTRICTIVE FILLING PATTERN (GRADE 3) CORRESPONDS TO REVERSIBLE        RESTRICTIVE PATTERN  MITRAL VALVE   Leaflets: Normal         Mobility: Fully mobile  Morphology: Normal  LEFT ATRIUM     Size: MILDLY ENLARGED   LA masses: No masses        Normal IAS  MAIN PA     Size: MILDLY DILATED  PULMONIC VALVE  Morphology: Normal   Mobility: Fully Mobile  RIGHT VENTRICLE     Size: MILDLY ENLARGED      Free wall: HYPOCONTRACTILE  Contraction: MILD GLOBAL DECREASE   RV masses: No Masses      TAPSE:  1.6 cm, Normal Range [>= 1.6 cm]    RV Note: RV S'= 8 cm/s  TRICUSPID VALVE   Leaflets: Normal         Mobility: Fully mobile  Morphology: Normal  RIGHT ATRIUM     Size: MODERATELY ENLARGED    RA Other: None   RA masses: No masses  PERICARDIUM     Fluid: No effusion  Pleural Eff: PLEURAL EFFUSION NOTED  INFERIOR VENACAVA     Size: DILATED  ABNORMAL RESPIRATORY COLLAPSE  DOPPLER ECHO and OTHER SPECIAL PROCEDURES ------------------------------------   Aortic: No AR         No AS    Mitral: MILD MR  No MS   MV Inflow E Vel.= 95.0 cm/s MV Annulus E'Vel.= 7.0 cm/s E/E'Ratio= 14  Tricuspid: MODERATE TR      No TS       3.4 m/s peak TR vel  61 mmHg peak RV pressure  Pulmonary: TRIVIAL PR       No PS  MODERATE LV DYSFUNCTION (See above)  ELEVATED LA PRESSURES WITH DIASTOLIC DYSFUNCTION  MILD RV SYSTOLIC DYSFUNCTION (See above)  VALVULAR REGURGITATION: MILD MR, TRIVIAL PR, MODERATE TR  NO VALVULAR STENOSIS  NO PRIOR STUDY FOR COMPARISON  3D acquisition and reconstructions were performed as part of this  examination to more accurately quantify the effects of reduced left  ventricular ejection fraction.   05/2019 nuclear stress Image Analysis:    Rest Stress Conclusion  Basal Anterior Normal Normal Normal  Basal Ant Septal Normal Normal Normal  Basal Inf Septal Normal Normal Normal  Basal Inferior Normal Normal Normal  Basal Inf Lat Mild Moderate Infrc/Isch  Basal Ant Lat Normal Normal Normal  Mid Anterior Normal Normal Normal  Mid Ant Septal Normal Normal Normal  Mid Inf Septal Normal Normal Normal  Mid Inferior Normal Normal Normal  Mid Inf Lateral Mild Moderate Infrc/Isch  Mid Ant Lateral Normal Normal Normal  Apical Anterior Normal Normal Normal  Apical Septal Normal Normal Normal  Apical Inferior Normal Normal Normal  Apical Lateral Normal Normal Normal  Apex Normal  Normal Normal  SPECT RESULTS  Technical Quality: Good   PERFUSION: Abnormal rest and stress perfusion.    STRESS TEST Pharmacologic  Protocol: Regadenoson Dose: 0.4 mg  Stage Dose Time (min) BP (mmHg) HR (bpm) Comments   SUPINE  00:58 161 / 82 81    DOSE 1 0.4 01:00  82    RECOV 1  01:00 139 / 61 85 Feels drained   RECOV 2  01:00  85 Aminophylline 75 mg IV   RECOV 3  01:00  85    RECOV 4  01:40 156 / 87 83 Feels better    Resting HR (bpm): 81 Resting BP (mmHg): 161 / 82 MaxPHR: 167 Target HR (bpm): 142  Peak HR (bpm): 85 Peak BP (mmHg): 139 / 61 % MaxPHR: 51 Double Product: [id number]  BP Response: Normal blood pressure response during stress  Stress Termination: Planned  Stress Symptoms: No CP reported.    ECG Negative  ECG Interpretation: Heart rate is N/A   IMAGE PROTOCOL Rest/Stress 1 Day Modality: GE 530c  Radiopharmaceutical Dose (mCi) Imaging  Date Inj Time Img Time Rescan Reason Rescan Time  Rest: Tc-89m Tetrofosmin, 6.6 06/19/2019 1319 1400  IV  Stress: Tc-22m Tetrofosmin, 18 06/19/2019 1432 1515  IV  Rest Administration Site: Left Forearm Tech Administering Rest Dose: Wenda Low, CNMT  Stress Administration Site: Left Forearm Tech Administering Stress Dose: Wenda Low, CNMT   FUNCTIONAL RESULTS (calculated via Gated SPECT)  Post Stress Image LV EF: 36 %  Stress EDV: 176 ml EDVI: 83 ml/m TID:  Stress ESV: 112 ml ESVI: 53 ml/m        Wall Motion   Wall Thickening  Anterior: Normal Normal  Apex: Normal Normal  Inferior: Severe - Hypokinetic Abnormal  Lateral: Mild - Hypokinetic Normal  Septal: Normal Normal   LV Ejection Fraction  & Size: Reduced left ventricular ejection fraction.    LV Function & Wall Thickening: Severely reduced left ventricular systolic function.      Electronically Reviewed by: Cornelia Copa MD      Assessment and  Plan  1. Chronic systolic HF - diagnosed in 2018 at [facility] - has not had a cath, I presume due to poor renal  function at the time. He has since started HD - prior stress tests have show inferolateral infarct with mild to moderate ischemia. Would seem out of proportion to degree of his systolic dysfunction.  He has not chest pain, no SOB/DOE - he stopped his medical therapy on his own, fatigue on toprol 100  - start toprol 12.5mg  daily. Titrate toprol as tolerated, once titrated start entresto - may consider cath, likely titrate meds and recheck LVEF in 3-6 months prior to pursuing cath EKG today SR, occasional PVCsw  2. Autonomic insufficiency - history as reported above, no recent issues   Virtual visit 2-3 weeks, titrate toprol likely   Arnoldo Lenis, M.D

## 2019-11-18 ENCOUNTER — Encounter: Payer: Self-pay | Admitting: Cardiology

## 2019-11-18 ENCOUNTER — Telehealth: Payer: Medicare Other | Admitting: Cardiology

## 2019-11-18 ENCOUNTER — Other Ambulatory Visit: Payer: Self-pay

## 2019-11-18 ENCOUNTER — Ambulatory Visit (INDEPENDENT_AMBULATORY_CARE_PROVIDER_SITE_OTHER): Payer: Medicare Other | Admitting: Cardiology

## 2019-11-18 VITALS — BP 118/52 | HR 66 | Ht 74.0 in | Wt 187.2 lb

## 2019-11-18 DIAGNOSIS — I5022 Chronic systolic (congestive) heart failure: Secondary | ICD-10-CM

## 2019-11-18 MED ORDER — LOSARTAN POTASSIUM 25 MG PO TABS: 12.5000 mg | ORAL_TABLET | Freq: Every day | ORAL | 3 refills | Status: DC

## 2019-11-18 NOTE — Progress Notes (Signed)
Clinical Summary David Rose is a 54 y.o.male seen today for follow up of the following medical problems.   1. Autonomic insufficiency - hx of poorly controlled DM previously with HgbA1c of 14. - severe autonomic insufficiency by orthostatics previously in clinic. He has had prior orthostatic syncopal episodes - therapy has been complicated by medication side effects.  - developed significant LE edema and weight gain on florinef at 0.2mg  daily. Currently on 0.1mg  daily, does require prn lasix at times for volume overload. At one point had 20+ lbs weight gain.  - currently on midodrine 10mg  tid, has supine HTN with headaches at times. .  - he maintains good oral intake with powerades, water, and increased sodium intake. Compliant with compression stockings.   - no significant symptoms since being managed for this a few years ago. Off midodrine   2. Chronic systolic HF - newly diagnosed in 2018, followed at [facility] previously - - echo 2018 LVEF 25-30% - 05/2019 echo LVEF 30-35% - did not have a cath, I presume due to borderline renal function at the time, had stress test. He has since started dialysis.  05/2017 Lexiscan Nuclear stress - inferolateral scar with mild to moderate ischemia, inferolateral akinesia 05/2019 [facility] lexiscan as reported below, essentially same inferolatearl findings - patient reports he declined AICD through [facility].    - no recent SOB, no significant DOE. Can have some LE edema at times, abdomen - he stopped toprol 100mg   on his own. He reports fatigue when taking. Carvedilol caused weakness.    - last visit started toprol 12.5mg  daily. Little bit of fatigue, overall tolerable. BP's during HD variable but no significant hypotension.     3. ESRD - on HD 1.5 years.   4. Ascites - prior paracentesis through [facility] Past Medical History:  Diagnosis Date  . ESRD (end stage renal disease) ([place])   . Hypertension   . Peripheral arterial  disease ([place])   . Type 2 diabetes mellitus (H[facility])      Allergies  Allergen Reactions  . Shellfish-Derived Products Anaphylaxis  . Wasp Venom Protein Anaphylaxis  . Penicillins      Current Outpatient Medications  Medication Sig Dispense Refill  . dicyclomine (BENTYL) 10 MG capsule Take 1 capsule by mouth QID.     Marland Kitchen metoprolol succinate (TOPROL XL) 25 MG 24 hr tablet Take 0.5 tablets (12.5 mg total) by mouth daily. 45 tablet 1   No current facility-administered medications for this visit.     Past Surgical History:  Procedure Laterality Date  . AV FISTULA PLACEMENT Right   . PARACENTESIS       Allergies  Allergen Reactions  . Shellfish-Derived Products Anaphylaxis  . Wasp Venom Protein Anaphylaxis  . Penicillins       Family History  Problem Relation Age of Onset  . Thyroid cancer Mother   . Leukemia Father      Social History David Rose reports that he has never smoked. He has never used smokeless tobacco. David Rose has no history on file for alcohol.   Review of Systems CONSTITUTIONAL: No weight loss, fever, chills, weakness or fatigue.  HEENT: Eyes: No visual loss, blurred vision, double vision or yellow sclerae.No hearing loss, sneezing, congestion, runny nose or sore throat.  SKIN: No rash or itching.  CARDIOVASCULAR: per hpi RESPIRATORY: No shortness of breath, cough or sputum.  GASTROINTESTINAL: No anorexia, nausea, vomiting or diarrhea. No abdominal pain or blood.  GENITOURINARY: No burning on urination, no  polyuria NEUROLOGICAL: No headache, dizziness, syncope, paralysis, ataxia, numbness or tingling in the extremities. No change in bowel or bladder control.  MUSCULOSKELETAL: No muscle, back pain, joint pain or stiffness.  LYMPHATICS: No enlarged nodes. No history of splenectomy.  PSYCHIATRIC: No history of depression or anxiety.  ENDOCRINOLOGIC: No reports of sweating, cold or heat intolerance. No polyuria or polydipsia.  Marland Kitchen   Physical  Examination Today's Vitals   11/18/19 1056  BP: (!) 118/52  Pulse: 66  SpO2: 97%  Weight: 187 lb 3.2 oz (84.9 kg)  Height: 6\' 2"  (1.88 m)   Body mass index is 24.04 kg/m.  Gen: resting comfortably, no acute distress HEENT: no scleral icterus, pupils equal round and reactive, no palptable cervical adenopathy,  CV: RRR, no m/r/g no jvd Resp: Clear to auscultation bilaterally GI: abdomen is soft, non-tender, non-distended, normal bowel sounds, no hepatosplenomegaly MSK: extremities are warm, no edema.  Skin: warm, no rash Neuro:  no focal deficits Psych: appropriate affect   Diagnostic Studies 05/2019 echo [facility] ECHOCARDIOGRAPHIC DESCRIPTIONS ----------------------------------------------- AORTIC ROOT     Size: Normal  Dissection: INDETERM FOR DISSECTION  AORTIC VALVE   Leaflets: Tricuspid       Morphology: Normal   Mobility: Fully Mobile  LEFT VENTRICLE                   Anterior: HYPOCONTRACTILE     Size: Normal                 Lateral: HYPOCONTRACTILE  Contraction: MOD GLOBAL DECREASE           Septal: HYPOCONTRACTILE  Closest EF: 35% (Estimated) Calc.EF: 38% (3D)   Apical: HYPOCONTRACTILE   LV masses: No Masses               Inferior: HYPOCONTRACTILE      LVH: None                 Posterior: HYPOCONTRACTILE  LV GLS(AVG): -9.5% Normal Range [ <= -16] Dias.FxClass: RESTRICTIVE FILLING PATTERN (GRADE 3) CORRESPONDS TO REVERSIBLE        RESTRICTIVE PATTERN  MITRAL VALVE   Leaflets: Normal         Mobility: Fully mobile  Morphology: Normal  LEFT ATRIUM     Size: MILDLY ENLARGED   LA masses: No masses        Normal IAS  MAIN PA     Size: MILDLY DILATED  PULMONIC VALVE  Morphology: Normal   Mobility: Fully Mobile  RIGHT VENTRICLE     Size: MILDLY ENLARGED      Free wall: HYPOCONTRACTILE  Contraction: MILD GLOBAL  DECREASE   RV masses: No Masses     TAPSE:  1.6 cm, Normal Range [>= 1.6 cm]    RV Note: RV S'= 8 cm/s  TRICUSPID VALVE   Leaflets: Normal         Mobility: Fully mobile  Morphology: Normal  RIGHT ATRIUM     Size: MODERATELY ENLARGED    RA Other: None   RA masses: No masses  PERICARDIUM     Fluid: No effusion  Pleural Eff: PLEURAL EFFUSION NOTED  INFERIOR VENACAVA     Size: DILATED  ABNORMAL RESPIRATORY COLLAPSE  DOPPLER ECHO and OTHER SPECIAL PROCEDURES ------------------------------------   Aortic: No AR         No AS    Mitral: MILD MR        No MS   MV Inflow E Vel.= 95.0 cm/s MV Annulus  E'Vel.= 7.0 cm/s E/E'Ratio= 14  Tricuspid: MODERATE TR      No TS       3.4 m/s peak TR vel  61 mmHg peak RV pressure  Pulmonary: TRIVIAL PR       No PS  MODERATE LV DYSFUNCTION (See above)  ELEVATED LA PRESSURES WITH DIASTOLIC DYSFUNCTION  MILD RV SYSTOLIC DYSFUNCTION (See above)  VALVULAR REGURGITATION: MILD MR, TRIVIAL PR, MODERATE TR  NO VALVULAR STENOSIS  NO PRIOR STUDY FOR COMPARISON  3D acquisition and reconstructions were performed as part of this  examination to more accurately quantify the effects of reduced left  ventricular ejection fraction.   05/2019 nuclear stress Image Analysis:    Rest Stress Conclusion  Basal Anterior Normal Normal Normal  Basal Ant Septal Normal Normal Normal  Basal Inf Septal Normal Normal Normal  Basal Inferior Normal Normal Normal  Basal Inf Lat Mild Moderate Infrc/Isch  Basal Ant Lat Normal Normal Normal  Mid Anterior Normal Normal Normal  Mid Ant Septal Normal Normal Normal  Mid Inf Septal Normal Normal Normal  Mid Inferior Normal Normal Normal  Mid Inf Lateral Mild Moderate Infrc/Isch  Mid Ant Lateral Normal Normal Normal  Apical Anterior Normal Normal Normal  Apical Septal Normal Normal Normal  Apical Inferior Normal Normal Normal   Apical Lateral Normal Normal Normal  Apex Normal Normal Normal  SPECT RESULTS  Technical Quality: Good   PERFUSION: Abnormal rest and stress perfusion.    STRESS TEST Pharmacologic  Protocol: Regadenoson Dose: 0.4 mg  Stage Dose Time (min) BP (mmHg) HR (bpm) Comments   SUPINE  00:58 161 / 82 81    DOSE 1 0.4 01:00  82    RECOV 1  01:00 139 / 61 85 Feels drained   RECOV 2  01:00  85 Aminophylline 75 mg IV   RECOV 3  01:00  85    RECOV 4  01:40 156 / 87 83 Feels better    Resting HR (bpm): 81 Resting BP (mmHg): 161 / 82 MaxPHR: 167 Target HR (bpm): 142  Peak HR (bpm): 85 Peak BP (mmHg): 139 / 61 % MaxPHR: 51 Double Product: [id number]  BP Response: Normal blood pressure response during stress  Stress Termination: Planned  Stress Symptoms: No CP reported.    ECG Negative  ECG Interpretation: Heart rate is N/A   IMAGE PROTOCOL Rest/Stress 1 Day Modality: GE 530c  Radiopharmaceutical Dose (mCi) Imaging  Date Inj Time Img Time Rescan Reason Rescan Time  Rest: Tc-71m Tetrofosmin, 6.6 06/19/2019 1319 1400  IV  Stress: Tc-31m Tetrofosmin, 18 06/19/2019 1432 1515  IV  Rest Administration Site: Left Forearm Tech Administering Rest Dose: David Rose, David Rose  Stress Administration Site: Left Forearm Tech Administering Stress Dose: David Rose, David Rose   FUNCTIONAL RESULTS (calculated via Gated SPECT)  Post Stress Image LV EF: 36 %  Stress EDV: 176 ml EDVI: 83 ml/m TID:  Stress ESV: 112 ml ESVI: 53 ml/m        Wall Motion   Wall Thickening  Anterior: Normal Normal  Apex: Normal Normal  Inferior: Severe - Hypokinetic Abnormal  Lateral: Mild - Hypokinetic Normal  Septal: Normal Normal   LV Ejection Fraction  & Size: Reduced left ventricular ejection fraction.    LV Function & Wall Thickening: Severely reduced left ventricular systolic function.      Electronically Reviewed by: David Copa MD       Assessment and Plan   1. Chronic systolic HF - diagnosed in 2018  at [facility]  - has not had a cath, I presume due to poor renal function at the time. He has since started HD - prior stress tests have show inferolateral infarct with mild to moderate ischemia. Would seem out of proportion to degree of his systolic dysfunction.  He has not chest pain, no SOB/DOE - he stopped his medical therapy on his own, fatigue on toprol 100  - mild side effects on toprol, continue 12.5mg  at this time - soft bp's at times based on HD bp log, will try losartan 12.5mg  daily as opposed to entreto, if tolerates ARB titration would convert to entersto.      Arnoldo Lenis, M.D

## 2019-11-18 NOTE — Patient Instructions (Addendum)
Medication Instructions:   Your physician has recommended you make the following change in your medication:   Start losartan 12.5 mg by mouth daily  Continue other medications the same  Labwork:  NONE  Testing/Procedures:  NONE  Follow-Up:  Your physician recommends that you schedule a follow-up appointment in: 3 weeks (virtual).  Any Other Special Instructions Will Be Listed Below (If Applicable).  If you need a refill on your cardiac medications before your next appointment, please call your pharmacy.

## 2019-12-05 ENCOUNTER — Telehealth: Payer: Self-pay | Admitting: Cardiology

## 2019-12-05 NOTE — Telephone Encounter (Signed)
Virtual Visit Pre-Appointment Phone Call  "(Name), I am calling you today to discuss your upcoming appointment. We are currently trying to limit exposure to the virus that causes COVID-19 by seeing patients at home rather than in the office."  1. "What is the BEST phone number to call the day of the visit?" - [phone]   2. "Do you have or have access to (through a family member/friend) a smartphone with video capability that we can use for your visit?" a. If yes - list this number in appt notes as "cell" (if different from BEST phone #) and list the appointment type as a VIDEO visit in appointment notes b. If no - list the appointment type as a PHONE visit in appointment notes  3. Confirm consent - "In the setting of the current Covid19 crisis, you are scheduled for a (phone or video) visit with your provider on (date) at (time).  Just as we do with many in-office visits, in order for you to participate in this visit, we must obtain consent.  If you'd like, I can send this to your mychart (if signed up) or email for you to review.  Otherwise, I can obtain your verbal consent now.  All virtual visits are billed to your insurance company just like a normal visit would be.  By agreeing to a virtual visit, we'd like you to understand that the technology does not allow for your provider to perform an examination, and thus may limit your provider's ability to fully assess your condition. If your provider identifies any concerns that need to be evaluated in person, we will make arrangements to do so.  Finally, though the technology is pretty good, we cannot assure that it will always work on either your or our end, and in the setting of a video visit, we may have to convert it to a phone-only visit.  In either situation, we cannot ensure that we have a secure connection.  Are you willing to proceed?" STAFF: Did the patient verbally acknowledge consent to telehealth visit? Document YES/NO  here:YES  4. Advise patient to be prepared - "Two hours prior to your appointment, go ahead and check your blood pressure, pulse, oxygen saturation, and your weight (if you have the equipment to check those) and write them all down. When your visit starts, your provider will ask you for this information. If you have an Apple Watch or Kardia device, please plan to have heart rate information ready on the day of your appointment. Please have a pen and paper handy nearby the day of the visit as well."  5. Give patient instructions for MyChart download to smartphone OR Doximity/Doxy.me as below if video visit (depending on what platform provider is using)  6. Inform patient they will receive a phone call 15 minutes prior to their appointment time (may be from unknown caller ID) so they should be prepared to answer    [name] has been deemed a candidate for a follow-up tele-health visit to limit community exposure during the Covid-19 pandemic. I spoke with the patient via phone to ensure availability of phone/video source, confirm preferred email & phone number, and discuss instructions and expectations.  I reminded David Rose. to be prepared with any vital sign and/or heart rhythm information that could potentially be obtained via home monitoring, at the time of his visit. I reminded David Rose. to expect a phone call prior to his  visit.  [name] 12/05/2019 11:44 AM   INSTRUCTIONS FOR DOWNLOADING THE MYCHART APP TO SMARTPHONE  - The patient must first make sure to have activated MyChart and know their login information - If Apple, go to [employer] and type in MyChart in the search bar and download the app. If Android, ask patient to go to [employer] and type in Daniel in the search bar and download the app. The app is free but as with any other app downloads, their phone may require them to verify saved payment information or  Apple/Android password.  - The patient will need to then log into the app with their MyChart username and password, and select [facility] as their healthcare provider to link the account. When it is time for your visit, go to the MyChart app, find appointments, and click Begin Video Visit. Be sure to Select Allow for your device to access the Microphone and Camera for your visit. You will then be connected, and your provider will be with you shortly.  **If they have any issues connecting, or need assistance please contact MyChart service desk [phone])**  **If using a computer, in order to ensure the best quality for their visit they will need to use either of the following Internet Browsers: [employer], or Google Chrome**  IF USING DOXIMITY or DOXY.ME - The patient will receive a link just prior to their visit by text.     FULL LENGTH CONSENT FOR TELE-HEALTH VISIT   I hereby voluntarily request, consent and authorize [facility] and its employed or contracted physicians, physician assistants, nurse practitioners or other licensed health care professionals (the Practitioner), to provide me with telemedicine health care services (the "Services") as deemed necessary by the treating Practitioner. I acknowledge and consent to receive the Services by the Practitioner via telemedicine. I understand that the telemedicine visit will involve communicating with the Practitioner through live audiovisual communication technology and the disclosure of certain medical information by electronic transmission. I acknowledge that I have been given the opportunity to request an in-person assessment or other available alternative prior to the telemedicine visit and am voluntarily participating in the telemedicine visit.  I understand that I have the right to withhold or withdraw my consent to the use of telemedicine in the course of my care at any time, without affecting my right to future care  or treatment, and that the Practitioner or I may terminate the telemedicine visit at any time. I understand that I have the right to inspect all information obtained and/or recorded in the course of the telemedicine visit and may receive copies of available information for a reasonable fee.  I understand that some of the potential risks of receiving the Services via telemedicine include:  Marland Kitchen Delay or interruption in medical evaluation due to technological equipment failure or disruption; . Information transmitted may not be sufficient (e.g. poor resolution of images) to allow for appropriate medical decision making by the Practitioner; and/or  . In rare instances, security protocols could fail, causing a breach of personal health information.  Furthermore, I acknowledge that it is my responsibility to provide information about my medical history, conditions and care that is complete and accurate to the best of my ability. I acknowledge that Practitioner's advice, recommendations, and/or decision may be based on factors not within their control, such as incomplete or inaccurate data provided by me or distortions of diagnostic images or specimens that may result from electronic transmissions. I understand  that the practice of medicine is not an exact science and that Practitioner makes no warranties or guarantees regarding treatment outcomes. I acknowledge that I will receive a copy of this consent concurrently upon execution via email to the email address I last provided but may also request a printed copy by calling the office of [facility].    I understand that my insurance will be billed for this visit.   I have read or had this consent read to me. . I understand the contents of this consent, which adequately explains the benefits and risks of the Services being provided via telemedicine.  . I have been provided ample opportunity to ask questions regarding this consent and the Services and have had  my questions answered to my satisfaction. . I give my informed consent for the services to be provided through the use of telemedicine in my medical care  By participating in this telemedicine visit I agree to the above.

## 2019-12-10 ENCOUNTER — Encounter: Payer: Self-pay | Admitting: Cardiology

## 2019-12-10 ENCOUNTER — Telehealth (INDEPENDENT_AMBULATORY_CARE_PROVIDER_SITE_OTHER): Payer: Medicare Other | Admitting: Cardiology

## 2019-12-10 VITALS — BP 125/86 | Ht 74.0 in | Wt 183.0 lb

## 2019-12-10 DIAGNOSIS — I5022 Chronic systolic (congestive) heart failure: Secondary | ICD-10-CM

## 2019-12-10 MED ORDER — METOPROLOL SUCCINATE ER 25 MG PO TB24: 25.0000 mg | ORAL_TABLET | Freq: Every day | ORAL | 1 refills | Status: DC

## 2019-12-10 NOTE — Progress Notes (Signed)
Virtual Visit via Telephone Note   This visit type was conducted due to national recommendations for restrictions regarding the COVID-19 Pandemic (e.g. social distancing) in an effort to limit this patient's exposure and mitigate transmission in our community.  Due to his co-morbid illnesses, this patient is at least at moderate risk for complications without adequate follow up.  This format is felt to be most appropriate for this patient at this time.  The patient did not have access to video technology/had technical difficulties with video requiring transitioning to audio format only (telephone).  All issues noted in this document were discussed and addressed.  No physical exam could be performed with this format.  Please refer to the patient's chart for his  consent to telehealth for [facility].   Date:  12/10/2019   ID:  David [place]., DOB [birth year], MRN [id number]  Patient Location: Home Provider Location: Office  PCP:  Ollen Bowl, MD  Cardiologist:  Carlyle Dolly, MD  Electrophysiologist:  None   Evaluation Performed:  Follow-Up Visit  Chief Complaint:  Follow up  History of Present Illness:    David Breyette. is a 54 y.o. male seen today for follow up of the following medical problems. This is a focused visit on history of chronic systolic HF, for more detailed history please refer to prior clinic notes.     1.Chronic systolic HF - newly diagnosed in 2018, followed [facility] - - echo 2018 LVEF 25-30% - 05/2019 echo LVEF 30-35% - did not have a cath, I presume due to borderline renal function at the time, had stress test. He has since started dialysis. 05/2017 Lexiscan Nuclear stressinferolateral scar with mild to moderate ischemia, inferolateral akinesia 05/2019 [facility] lexiscan as reported below, essentially same inferolatearl findings - patient reports he declined AICD through [facility].    - last visit started losartan 12.5mg   daily - previously started toprol 12.5mg  daily, had some mild fatigue but tolerable.  - no issues with bp's during HD.  - no recent SOB/DOE   2. ESRD - onHD1.5 years.     The patient does not have symptoms concerning for COVID-19 infection (fever, chills, cough, or new shortness of breath).    Past Medical History:  Diagnosis Date  . ESRD (end stage renal disease) ([place])   . Hypertension   . Peripheral arterial disease ([place])   . Type 2 diabetes mellitus ([place])    Past Surgical History:  Procedure Laterality Date  . AV FISTULA PLACEMENT Right   . PARACENTESIS       No outpatient medications have been marked as taking for the 12/10/19 encounter (Appointment) with Arnoldo Lenis, MD.     Allergies:   Shellfish-derived products, Wasp venom protein, and Penicillins   Social History   Tobacco Use  . Smoking status: Never Smoker  . Smokeless tobacco: Never Used  Substance Use Topics  . Alcohol use: Not on file  . Drug use: Not on file     Family Hx: The patient's family history includes Leukemia in his father; Thyroid cancer in his mother.  ROS:   Please see the history of present illness.     All other systems reviewed and are negative.   Prior CV studies:   The following studies were reviewed today:  05/2019 echo [facility] ECHOCARDIOGRAPHIC DESCRIPTIONS ----------------------------------------------- AORTIC ROOT    Size: Normal Dissection: INDETERM FOR DISSECTION  AORTIC VALVE  Leaflets: Tricuspid       Morphology: Normal  Mobility: Fully Mobile  LEFT VENTRICLE                   Anterior: HYPOCONTRACTILE    Size: Normal                 Lateral: HYPOCONTRACTILE Contraction: MOD GLOBAL DECREASE           Septal: HYPOCONTRACTILE Closest EF: 35% (Estimated) Calc.EF: 38% (3D)   Apical: HYPOCONTRACTILE  LV masses: No Masses               Inferior: HYPOCONTRACTILE      LVH: None                 Posterior: HYPOCONTRACTILE LV GLS(AVG): -9.5% Normal Range [ <= -16] Dias.FxClass: RESTRICTIVE FILLING PATTERN (GRADE 3) CORRESPONDS TO REVERSIBLE       RESTRICTIVE PATTERN  MITRAL VALVE  Leaflets: Normal         Mobility: Fully mobile Morphology: Normal  LEFT ATRIUM    Size: MILDLY ENLARGED  LA masses: No masses       Normal IAS  MAIN PA    Size: MILDLY DILATED  PULMONIC VALVE Morphology: Normal  Mobility: Fully Mobile  RIGHT VENTRICLE    Size: MILDLY ENLARGED      Free wall: HYPOCONTRACTILE Contraction: MILD GLOBAL DECREASE   RV masses: No Masses    TAPSE:  1.6 cm, Normal Range [>= 1.6 cm]   RV Note: RV S'= 8 cm/s  TRICUSPID VALVE  Leaflets: Normal         Mobility: Fully mobile Morphology: Normal  RIGHT ATRIUM    Size: MODERATELY ENLARGED    RA Other: None  RA masses: No masses  PERICARDIUM    Fluid: No effusion Pleural Eff: PLEURAL EFFUSION NOTED  INFERIOR VENACAVA    Size: DILATED  ABNORMAL RESPIRATORY COLLAPSE  DOPPLER ECHO and OTHER SPECIAL PROCEDURES ------------------------------------  Aortic: No AR         No AS   Mitral: MILD MR        No MS  MV Inflow E Vel.= 95.0 cm/s MV Annulus E'Vel.= 7.0 cm/s E/E'Ratio= 14  Tricuspid: MODERATE TR      No TS      3.4 m/s peak TR vel  61 mmHg peak RV pressure  Pulmonary: TRIVIAL PR       No PS  MODERATE LV DYSFUNCTION (See above) ELEVATED LA PRESSURES WITH DIASTOLIC DYSFUNCTION MILD RV SYSTOLIC DYSFUNCTION (See above) VALVULAR REGURGITATION: MILD MR, TRIVIAL PR, MODERATE TR NO VALVULAR STENOSIS NO PRIOR STUDY FOR COMPARISON 3D acquisition and reconstructions were performed as part of this examination to more accurately quantify the effects of reduced left ventricular ejection  fraction.   05/2019 nuclear stress Image Analysis:   Rest Stress Conclusion Basal Anterior Normal Normal Normal Basal Ant Septal Normal Normal Normal Basal Inf Septal Normal Normal Normal Basal Inferior Normal Normal Normal Basal Inf Lat Mild Moderate Infrc/Isch Basal Ant Lat Normal Normal Normal Mid Anterior Normal Normal Normal Mid Ant Septal Normal Normal Normal Mid Inf Septal Normal Normal Normal Mid Inferior Normal Normal Normal Mid Inf Lateral Mild Moderate Infrc/Isch Mid Ant Lateral Normal Normal Normal Apical Anterior Normal Normal Normal Apical Septal Normal Normal Normal Apical Inferior Normal Normal Normal Apical Lateral Normal Normal Normal Apex Normal Normal Normal SPECT RESULTS Technical Quality: Good  PERFUSION: Abnormal rest and stress perfusion.   STRESS TEST Pharmacologic Protocol: Regadenoson Dose: 0.4 mg Stage Dose Time (min) BP (mmHg) HR (bpm) Comments  SUPINE 00:58 161 / 82 81  DOSE 1 0.4 01:00 82  RECOV 1 01:00 139 / 61 85 Feels drained  RECOV 2 01:00 85 Aminophylline 75 mg IV  RECOV 3 01:00 85  RECOV 4 01:40 156 / 87 83 Feels better   Resting HR (bpm): 81 Resting BP (mmHg): 161 / 82 MaxPHR: 167 Target HR (bpm): 142 Peak HR (bpm): 85 Peak BP (mmHg): 139 / 61 % MaxPHR: 51 Double Product: [id number] BP Response: Normal blood pressure response during stress Stress Termination: Planned Stress Symptoms: No CP reported.   ECG Negative ECG Interpretation: Heart rate is N/A  IMAGE PROTOCOL Rest/Stress 1 Day Modality: GE 530c Radiopharmaceutical Dose (mCi) Imaging Date Inj Time Img Time Rescan Reason Rescan Time Rest: Tc-7m Tetrofosmin, 6.6 06/19/2019 1319 1400 IV Stress: Tc-24m Tetrofosmin, 18 06/19/2019 1432 1515 IV Rest Administration Site: Left Forearm Tech Administering Rest Dose: Wenda Low, CNMT Stress Administration Site: Left Forearm Tech Administering Stress Dose: Wenda Low,  CNMT  FUNCTIONAL RESULTS (calculated via Gated SPECT) Post Stress Image LV EF: 36 % Stress EDV: 176 ml EDVI: 83 ml/m TID: Stress ESV: 112 ml ESVI: 53 ml/m Wall Motion Wall Thickening Anterior: Normal Normal Apex: Normal Normal Inferior: Severe - Hypokinetic Abnormal Lateral: Mild - Hypokinetic Normal Septal: Normal Normal  LV Ejection Fraction &Size: Reduced left ventricular ejection fraction.   LV Function &Wall Thickening: Severely reduced left ventricular systolic function.     Electronically Reviewed by: Cornelia Copa MD    Labs/Other Tests and Data Reviewed:    EKG:  No ECG reviewed.  Recent Labs: No results found for requested labs within last 8760 hours.   Recent Lipid Panel No results found for: CHOL, TRIG, HDL, CHOLHDL, LDLCALC, LDLDIRECT  Wt Readings from Last 3 Encounters:  11/18/19 187 lb 3.2 oz (84.9 kg)  10/28/19 188 lb 3.2 oz (85.4 kg)  11/11/15 169 lb (76.7 kg)     Objective:    Vital Signs:   Today's Vitals   12/10/19 1014  BP: 125/86  Weight: 182 lb 15.7 oz (83 kg)  Height: 6\' 2"  (1.88 m)   Body mass index is 23.49 kg/m. Normal affect. Normal speech pattern and tone. Comfortable, no apparent distress. No audible signs of SOB or wheezing.   ASSESSMENT & PLAN:     1.Chronic systolic HF - diagnosed in 2018 at [facility] - has not had a cath, I presume due to poor renal function at the time. He has since started HD - prior stress tests have shown inferolateral infarct with mild to moderate ischemia. Would seem out of proportion to degree of his systolic dysfunction. He has not chest pain, no SOB/DOE - he stopped his medical therapy on his own, fatigue on toprol 100  - we will increase toprol to 25mg  daily Continued titration of CHF meds as tolerated.     COVID-19 Education: The signs and symptoms of COVID-19 were discussed with the patient and how to seek care for testing (follow up with PCP or arrange  E-visit).  The importance of social distancing was discussed today.  Time:   Today, I have spent 14 minutes with the patient with telehealth technology discussing the above problems.     Medication Adjustments/Labs and Tests Ordered: Current medicines are reviewed at length with the patient today.  Concerns regarding medicines are outlined above.   Tests Ordered: No orders of the defined types were placed in this encounter.   Medication Changes: No orders of the defined types were  placed in this encounter.   Follow Up:  Virtual Visit  in 3 week(s)  Signed, Carlyle Dolly, MD  12/10/2019 9:00 AM    [facility]

## 2019-12-10 NOTE — Patient Instructions (Addendum)
Medication Instructions:   Increase Toprol XL to 25mg  daily - new prescription sent to [facility] today.  Continue all other medications.    Labwork: none  Testing/Procedures: none  Follow-Up: 3 weeks   Any Other Special Instructions Will Be Listed Below (If Applicable).  If you need a refill on your cardiac medications before your next appointment, please call your pharmacy.

## 2019-12-10 NOTE — Addendum Note (Signed)
Addended by: Laurine Blazer on: 12/10/2019 11:06 AM   Modules accepted: Orders

## 2019-12-31 ENCOUNTER — Encounter: Payer: Self-pay | Admitting: Cardiology

## 2019-12-31 ENCOUNTER — Other Ambulatory Visit: Payer: Self-pay

## 2019-12-31 ENCOUNTER — Ambulatory Visit (INDEPENDENT_AMBULATORY_CARE_PROVIDER_SITE_OTHER): Payer: Medicare Other | Admitting: Cardiology

## 2019-12-31 VITALS — BP 118/64 | HR 74 | Ht 74.0 in | Wt 181.8 lb

## 2019-12-31 DIAGNOSIS — I5022 Chronic systolic (congestive) heart failure: Secondary | ICD-10-CM

## 2019-12-31 NOTE — Patient Instructions (Signed)
Your physician wants you to follow-up in: 2 [place] will receive a reminder letter in the mail two months in advance. If you don't receive a letter, please call our office to schedule the follow-up appointment.  Your physician recommends that you continue on your current medications as directed. Please refer to the Current Medication list given to you today.  Thank you for choosing [facility]!!

## 2019-12-31 NOTE — Progress Notes (Signed)
Clinical Summary Mr. Godbolt is a 54 y.o.male seen today for follow up of the following medical problems.     1.Chronic systolic HF - newly diagnosed in 2018, followed at [facility] previously - - echo 2018 LVEF 25-30% - 05/2019 echo LVEF 30-35% - did not have a cath, I presume due to borderline renal function at the time, had stress test. He has since started dialysis. 05/2017 Lexiscan Nuclear stressinferolateral scar with mild to moderate ischemia, inferolateral akinesia 05/2019 [facility] lexiscan as reported below, essentially same inferolatearl findings - patient reports he declined AICD through [facility].  Restarted meds in January 2021.   - last visit started losartan 12.5mg  daily   - last visit we increased toprol to 25mg  daily.  - episode of low bp's after HD, down to 90s/50s. Mild lightheadedness during episode.  - no recent SOB/DOE    2. ESRD - onHD1.5 years.   Past Medical History:  Diagnosis Date  . ESRD (end stage renal disease) ([place])   . Hypertension   . Peripheral arterial disease ([place])   . Type 2 diabetes mellitus (HCC)      Allergies  Allergen Reactions  . Shellfish-Derived Products Anaphylaxis  . Wasp Venom Protein Anaphylaxis  . Penicillins      Current Outpatient Medications  Medication Sig Dispense Refill  . losartan (COZAAR) 25 MG tablet Take 0.5 tablets (12.5 mg total) by mouth daily. 45 tablet 3  . metoprolol succinate (TOPROL XL) 25 MG 24 hr tablet Take 1 tablet (25 mg total) by mouth daily. 90 tablet 1   No current facility-administered medications for this visit.     Past Surgical History:  Procedure Laterality Date  . AV FISTULA PLACEMENT Right   . PARACENTESIS       Allergies  Allergen Reactions  . Shellfish-Derived Products Anaphylaxis  . Wasp Venom Protein Anaphylaxis  . Penicillins       Family History  Problem Relation Age of Onset  . Thyroid cancer Mother   . Leukemia Father      Social  History Mr. Funke reports that he has never smoked. He has never used smokeless tobacco. Mr. Mulkern has no history on file for alcohol.   Review of Systems CONSTITUTIONAL: No weight loss, fever, chills, weakness or fatigue.  HEENT: Eyes: No visual loss, blurred vision, double vision or yellow sclerae.No hearing loss, sneezing, congestion, runny nose or sore throat.  SKIN: No rash or itching.  CARDIOVASCULAR: per hpi RESPIRATORY: No shortness of breath, cough or sputum.  GASTROINTESTINAL: No anorexia, nausea, vomiting or diarrhea. No abdominal pain or blood.  GENITOURINARY: No burning on urination, no polyuria NEUROLOGICAL: No headache, dizziness, syncope, paralysis, ataxia, numbness or tingling in the extremities. No change in bowel or bladder control.  MUSCULOSKELETAL: No muscle, back pain, joint pain or stiffness.  LYMPHATICS: No enlarged nodes. No history of splenectomy.  PSYCHIATRIC: No history of depression or anxiety.  ENDOCRINOLOGIC: No reports of sweating, cold or heat intolerance. No polyuria or polydipsia.  Marland Kitchen   Physical Examination Today's Vitals   12/31/19 1459  BP: 118/64  Pulse: 74  SpO2: 98%  Weight: 181 lb 12.8 oz (82.5 kg)  Height: 6\' 2"  (1.88 m)   Body mass index is 23.34 kg/m.  Gen: resting comfortably, no acute distress HEENT: no scleral icterus, pupils equal round and reactive, no palptable cervical adenopathy,  CV: RRR, no m/r/g no jvd Resp: Clear to auscultation bilaterally GI: abdomen is soft, non-tender, non-distended, normal bowel sounds, no hepatosplenomegaly  MSK: extremities are warm, no edema.  Skin: warm, no rash Neuro:  no focal deficits Psych: appropriate affect   Diagnostic Studies  05/2019 echo [facility] ECHOCARDIOGRAPHIC DESCRIPTIONS ----------------------------------------------- AORTIC ROOT    Size: Normal Dissection: INDETERM FOR DISSECTION  AORTIC VALVE  Leaflets: Tricuspid       Morphology: Normal  Mobility:  Fully Mobile  LEFT VENTRICLE                   Anterior: HYPOCONTRACTILE    Size: Normal                 Lateral: HYPOCONTRACTILE Contraction: MOD GLOBAL DECREASE           Septal: HYPOCONTRACTILE Closest EF: 35% (Estimated) Calc.EF: 38% (3D)   Apical: HYPOCONTRACTILE  LV masses: No Masses               Inferior: HYPOCONTRACTILE     LVH: None                 Posterior: HYPOCONTRACTILE LV GLS(AVG): -9.5% Normal Range [ <= -16] Dias.FxClass: RESTRICTIVE FILLING PATTERN (GRADE 3) CORRESPONDS TO REVERSIBLE       RESTRICTIVE PATTERN  MITRAL VALVE  Leaflets: Normal         Mobility: Fully mobile Morphology: Normal  LEFT ATRIUM    Size: MILDLY ENLARGED  LA masses: No masses       Normal IAS  MAIN PA    Size: MILDLY DILATED  PULMONIC VALVE Morphology: Normal  Mobility: Fully Mobile  RIGHT VENTRICLE    Size: MILDLY ENLARGED      Free wall: HYPOCONTRACTILE Contraction: MILD GLOBAL DECREASE   RV masses: No Masses    TAPSE:  1.6 cm, Normal Range [>= 1.6 cm]   RV Note: RV S'= 8 cm/s  TRICUSPID VALVE  Leaflets: Normal         Mobility: Fully mobile Morphology: Normal  RIGHT ATRIUM    Size: MODERATELY ENLARGED    RA Other: None  RA masses: No masses  PERICARDIUM    Fluid: No effusion Pleural Eff: PLEURAL EFFUSION NOTED  INFERIOR VENACAVA    Size: DILATED  ABNORMAL RESPIRATORY COLLAPSE  DOPPLER ECHO and OTHER SPECIAL PROCEDURES ------------------------------------  Aortic: No AR         No AS   Mitral: MILD MR        No MS  MV Inflow E Vel.= 95.0 cm/s MV Annulus E'Vel.= 7.0 cm/s E/E'Ratio= 14  Tricuspid: MODERATE TR      No TS      3.4 m/s peak TR vel  61 mmHg peak RV pressure  Pulmonary: TRIVIAL PR       No PS  MODERATE LV  DYSFUNCTION (See above) ELEVATED LA PRESSURES WITH DIASTOLIC DYSFUNCTION MILD RV SYSTOLIC DYSFUNCTION (See above) VALVULAR REGURGITATION: MILD MR, TRIVIAL PR, MODERATE TR NO VALVULAR STENOSIS NO PRIOR STUDY FOR COMPARISON 3D acquisition and reconstructions were performed as part of this examination to more accurately quantify the effects of reduced left ventricular ejection fraction.   05/2019 nuclear stress Image Analysis:   Rest Stress Conclusion Basal Anterior Normal Normal Normal Basal Ant Septal Normal Normal Normal Basal Inf Septal Normal Normal Normal Basal Inferior Normal Normal Normal Basal Inf Lat Mild Moderate Infrc/Isch Basal Ant Lat Normal Normal Normal Mid Anterior Normal Normal Normal Mid Ant Septal Normal Normal Normal Mid Inf Septal Normal Normal Normal Mid Inferior Normal Normal Normal Mid Inf Lateral Mild Moderate Infrc/Isch Mid Ant Lateral Normal Normal Normal  Apical Anterior Normal Normal Normal Apical Septal Normal Normal Normal Apical Inferior Normal Normal Normal Apical Lateral Normal Normal Normal Apex Normal Normal Normal SPECT RESULTS Technical Quality: Good  PERFUSION: Abnormal rest and stress perfusion.   STRESS TEST Pharmacologic Protocol: Regadenoson Dose: 0.4 mg Stage Dose Time (min) BP (mmHg) HR (bpm) Comments  SUPINE 00:58 161 / 82 81  DOSE 1 0.4 01:00 82  RECOV 1 01:00 139 / 61 85 Feels drained  RECOV 2 01:00 85 Aminophylline 75 mg IV  RECOV 3 01:00 85  RECOV 4 01:40 156 / 87 83 Feels better   Resting HR (bpm): 81 Resting BP (mmHg): 161 / 82 MaxPHR: 167 Target HR (bpm): 142 Peak HR (bpm): 85 Peak BP (mmHg): 139 / 61 % MaxPHR: 51 Double Product: 62703 BP Response: Normal blood pressure response during stress Stress Termination: Planned Stress Symptoms: No CP reported.   ECG Negative ECG Interpretation: Heart rate is N/A  IMAGE PROTOCOL Rest/Stress 1 Day  Modality: GE 530c Radiopharmaceutical Dose (mCi) Imaging Date Inj Time Img Time Rescan Reason Rescan Time Rest: Tc-51m Tetrofosmin, 6.6 06/19/2019 1319 1400 IV Stress: Tc-40m Tetrofosmin, 18 06/19/2019 1432 1515 IV Rest Administration Site: Left Forearm Tech Administering Rest Dose: Wenda Low, CNMT Stress Administration Site: Left Forearm Tech Administering Stress Dose: Wenda Low, CNMT  FUNCTIONAL RESULTS (calculated via Gated SPECT) Post Stress Image LV EF: 36 % Stress EDV: 176 ml EDVI: 83 ml/m TID: Stress ESV: 112 ml ESVI: 53 ml/m Wall Motion Wall Thickening Anterior: Normal Normal Apex: Normal Normal Inferior: Severe - Hypokinetic Abnormal Lateral: Mild - Hypokinetic Normal Septal: Normal Normal  LV Ejection Fraction &Size: Reduced left ventricular ejection fraction.   LV Function &Wall Thickening: Severely reduced left ventricular systolic function.     Electronically Reviewed by: Cornelia Copa MD   Assessment and Plan  1.Chronic systolic HF - diagnosed in 2018 at [facility] - has not had a cath, I presume due to poor renal function at the time. He has since started HD - prior stress tests have shown inferolateral infarct with mild to moderate ischemia. Would seem out of proportion to degree of his systolic dysfunction. He has not chest pain, no SOB/DOE - he stopped his medical therapy on his own, we have been reinitaiting  - medical therapy limited by low bp's. Did have an episode of mild symptoms after HD just recently - continue current regimen, would plan to repeat echo in next few months - if no improvement with LVEF would consider cath at that time.       Arnoldo Lenis, M.D.

## 2020-03-06 ENCOUNTER — Ambulatory Visit (INDEPENDENT_AMBULATORY_CARE_PROVIDER_SITE_OTHER): Payer: Medicare Other | Admitting: Cardiology

## 2020-03-06 ENCOUNTER — Encounter: Payer: Self-pay | Admitting: Cardiology

## 2020-03-06 ENCOUNTER — Other Ambulatory Visit: Payer: Self-pay

## 2020-03-06 VITALS — BP 124/60 | HR 72 | Ht 74.0 in | Wt 186.8 lb

## 2020-03-06 DIAGNOSIS — I5022 Chronic systolic (congestive) heart failure: Secondary | ICD-10-CM

## 2020-03-06 MED ORDER — METOPROLOL SUCCINATE ER 25 MG PO TB24: 37.5000 mg | ORAL_TABLET | Freq: Every day | ORAL | 1 refills | Status: DC

## 2020-03-06 NOTE — Patient Instructions (Signed)
Your physician recommends that you schedule a follow-up appointment in: [place] has recommended you make the following change in your medication:   INCREASE TOPROL XL 37.5 MG (1 AND 1/2 TABLETS) DAILY   Your physician has requested that you have an echocardiogram. Echocardiography is a painless test that uses sound waves to create images of your heart. It provides your doctor with information about the size and shape of your heart and how well your heart's chambers and valves are working. This procedure takes approximately one hour. There are no restrictions for this procedure.  Thank you for choosing [facility]!!

## 2020-03-06 NOTE — Progress Notes (Signed)
Clinical Summary David Rose is a 54 y.o.male seen today for follow up of the following medical problems.     1.Chronic systolic HF - newly diagnosed in 2018, followed at [facility] previously - - echo 2018 LVEF 25-30% - 05/2019 echo LVEF 30-35% - did not have a cath, I presume due to borderline renal function at the time, had stress test. He has since started dialysis. 05/2017 Lexiscan Nuclear stressinferolateral scar with mild to moderate ischemia, inferolateral akinesia 05/2019 [facility] lexiscan as reported below, essentially same inferolatearl findings - patient reports he declined AICD through [facility].  Restarted meds in January 2021.   Had some issues with low bp's with HD last visit, we did not adjust meds - recently reports these issues have resolved, tolerating current regimen - no SOB or DOE   2. ESRD - onHD1.5 years.  - bp's 130/70 during HD.  - he takes torpol and losartan nightly.   Past Medical History:  Diagnosis Date  . ESRD (end stage renal disease) ([place])   . Hypertension   . Peripheral arterial disease ([place])   . Type 2 diabetes mellitus (HCC)      Allergies  Allergen Reactions  . Shellfish-Derived Products Anaphylaxis  . Wasp Venom Protein Anaphylaxis  . Penicillins      Current Outpatient Medications  Medication Sig Dispense Refill  . calcium acetate (PHOSLO) 667 MG capsule Take 1 capsule by mouth 3 (three) times daily.    Marland Kitchen losartan (COZAAR) 25 MG tablet Take 0.5 tablets (12.5 mg total) by mouth daily. 45 tablet 3  . metoprolol succinate (TOPROL XL) 25 MG 24 hr tablet Take 1 tablet (25 mg total) by mouth daily. 90 tablet 1   No current facility-administered medications for this visit.     Past Surgical History:  Procedure Laterality Date  . AV FISTULA PLACEMENT Right   . PARACENTESIS       Allergies  Allergen Reactions  . Shellfish-Derived Products Anaphylaxis  . Wasp Venom Protein Anaphylaxis  . Penicillins        Family History  Problem Relation Age of Onset  . Thyroid cancer Mother   . Leukemia Father      Social History David Rose reports that he has never smoked. He has never used smokeless tobacco. David Rose has no history on file for alcohol.   Review of Systems CONSTITUTIONAL: No weight loss, fever, chills, weakness or fatigue.  HEENT: Eyes: No visual loss, blurred vision, double vision or yellow sclerae.No hearing loss, sneezing, congestion, runny nose or sore throat.  SKIN: No rash or itching.  CARDIOVASCULAR: per hpi RESPIRATORY: No shortness of breath, cough or sputum.  GASTROINTESTINAL: No anorexia, nausea, vomiting or diarrhea. No abdominal pain or blood.  GENITOURINARY: No burning on urination, no polyuria NEUROLOGICAL: No headache, dizziness, syncope, paralysis, ataxia, numbness or tingling in the extremities. No change in bowel or bladder control.  MUSCULOSKELETAL: No muscle, back pain, joint pain or stiffness.  LYMPHATICS: No enlarged nodes. No history of splenectomy.  PSYCHIATRIC: No history of depression or anxiety.  ENDOCRINOLOGIC: No reports of sweating, cold or heat intolerance. No polyuria or polydipsia.  Marland Kitchen   Physical Examination Today's Vitals   03/06/20 1418  BP: 124/60  Pulse: 72  SpO2: 95%  Weight: 186 lb 12.8 oz (84.7 kg)  Height: 6\' 2"  (1.88 m)   Body mass index is 23.98 kg/m.  Gen: resting comfortably, no acute distress HEENT: no scleral icterus, pupils equal round and reactive, no palptable cervical  adenopathy,  CV: RRR, no m/r,g no jvd Resp: Clear to auscultation bilaterally GI: abdomen is soft, non-tender, non-distended, normal bowel sounds, no hepatosplenomegaly MSK: extremities are warm, no edema.  Skin: warm, no rash Neuro:  no focal deficits Psych: appropriate affect   Diagnostic Studies 05/2019 echo [facility] ECHOCARDIOGRAPHIC DESCRIPTIONS ----------------------------------------------- AORTIC ROOT    Size:  Normal Dissection: INDETERM FOR DISSECTION  AORTIC VALVE  Leaflets: Tricuspid       Morphology: Normal  Mobility: Fully Mobile  LEFT VENTRICLE                   Anterior: HYPOCONTRACTILE    Size: Normal                 Lateral: HYPOCONTRACTILE Contraction: MOD GLOBAL DECREASE           Septal: HYPOCONTRACTILE Closest EF: 35% (Estimated) Calc.EF: 38% (3D)   Apical: HYPOCONTRACTILE  LV masses: No Masses               Inferior: HYPOCONTRACTILE     LVH: None                 Posterior: HYPOCONTRACTILE LV GLS(AVG): -9.5% Normal Range [ <= -16] Dias.FxClass: RESTRICTIVE FILLING PATTERN (GRADE 3) CORRESPONDS TO REVERSIBLE       RESTRICTIVE PATTERN  MITRAL VALVE  Leaflets: Normal         Mobility: Fully mobile Morphology: Normal  LEFT ATRIUM    Size: MILDLY ENLARGED  LA masses: No masses       Normal IAS  MAIN PA    Size: MILDLY DILATED  PULMONIC VALVE Morphology: Normal  Mobility: Fully Mobile  RIGHT VENTRICLE    Size: MILDLY ENLARGED      Free wall: HYPOCONTRACTILE Contraction: MILD GLOBAL DECREASE   RV masses: No Masses    TAPSE:  1.6 cm, Normal Range [>= 1.6 cm]   RV Note: RV S'= 8 cm/s  TRICUSPID VALVE  Leaflets: Normal         Mobility: Fully mobile Morphology: Normal  RIGHT ATRIUM    Size: MODERATELY ENLARGED    RA Other: None  RA masses: No masses  PERICARDIUM    Fluid: No effusion Pleural Eff: PLEURAL EFFUSION NOTED  INFERIOR VENACAVA    Size: DILATED  ABNORMAL RESPIRATORY COLLAPSE  DOPPLER ECHO and OTHER SPECIAL PROCEDURES ------------------------------------  Aortic: No AR         No AS   Mitral: MILD MR        No MS  MV Inflow E Vel.= 95.0 cm/s MV Annulus E'Vel.= 7.0 cm/s E/E'Ratio= 14  Tricuspid: MODERATE TR       No TS      3.4 m/s peak TR vel  61 mmHg peak RV pressure  Pulmonary: TRIVIAL PR       No PS  MODERATE LV DYSFUNCTION (See above) ELEVATED LA PRESSURES WITH DIASTOLIC DYSFUNCTION MILD RV SYSTOLIC DYSFUNCTION (See above) VALVULAR REGURGITATION: MILD MR, TRIVIAL PR, MODERATE TR NO VALVULAR STENOSIS NO PRIOR STUDY FOR COMPARISON 3D acquisition and reconstructions were performed as part of this examination to more accurately quantify the effects of reduced left ventricular ejection fraction.   05/2019 nuclear stress Image Analysis:   Rest Stress Conclusion Basal Anterior Normal Normal Normal Basal Ant Septal Normal Normal Normal Basal Inf Septal Normal Normal Normal Basal Inferior Normal Normal Normal Basal Inf Lat Mild Moderate Infrc/Isch Basal Ant Lat Normal Normal Normal Mid Anterior Normal Normal Normal Mid Ant Septal Normal Normal Normal  Mid Inf Septal Normal Normal Normal Mid Inferior Normal Normal Normal Mid Inf Lateral Mild Moderate Infrc/Isch Mid Ant Lateral Normal Normal Normal Apical Anterior Normal Normal Normal Apical Septal Normal Normal Normal Apical Inferior Normal Normal Normal Apical Lateral Normal Normal Normal Apex Normal Normal Normal SPECT RESULTS Technical Quality: Good  PERFUSION: Abnormal rest and stress perfusion.   STRESS TEST Pharmacologic Protocol: Regadenoson Dose: 0.4 mg Stage Dose Time (min) BP (mmHg) HR (bpm) Comments  SUPINE 00:58 161 / 82 81  DOSE 1 0.4 01:00 82  RECOV 1 01:00 139 / 61 85 Feels drained  RECOV 2 01:00 85 Aminophylline 75 mg IV  RECOV 3 01:00 85  RECOV 4 01:40 156 / 87 83 Feels better   Resting HR (bpm): 81 Resting BP (mmHg): 161 / 82 MaxPHR: 167 Target HR (bpm): 142 Peak HR (bpm): 85 Peak BP (mmHg): 139 / 61 % MaxPHR: 51 Double Product: 85027 BP Response: Normal blood pressure response during stress Stress Termination: Planned Stress  Symptoms: No CP reported.   ECG Negative ECG Interpretation: Heart rate is N/A  IMAGE PROTOCOL Rest/Stress 1 Day Modality: GE 530c Radiopharmaceutical Dose (mCi) Imaging Date Inj Time Img Time Rescan Reason Rescan Time Rest: Tc-14m Tetrofosmin, 6.6 06/19/2019 1319 1400 IV Stress: Tc-29m Tetrofosmin, 18 06/19/2019 1432 1515 IV Rest Administration Site: Left Forearm Tech Administering Rest Dose: Wenda Low, CNMT Stress Administration Site: Left Forearm Tech Administering Stress Dose: Wenda Low, CNMT  FUNCTIONAL RESULTS (calculated via Gated SPECT) Post Stress Image LV EF: 36 % Stress EDV: 176 ml EDVI: 83 ml/m TID: Stress ESV: 112 ml ESVI: 53 ml/m Wall Motion Wall Thickening Anterior: Normal Normal Apex: Normal Normal Inferior: Severe - Hypokinetic Abnormal Lateral: Mild - Hypokinetic Normal Septal: Normal Normal  LV Ejection Fraction &Size: Reduced left ventricular ejection fraction.   LV Function &Wall Thickening: Severely reduced left ventricular systolic function.     Electronically Reviewed by: Cornelia Copa MD   Assessment and Plan  1.Chronic systolic HF - diagnosed in 2018 at [facility] - has not had a cath, I presume due to poor renal function at the time. He has since started HD - prior stress tests have showninferolateral infarct with mild to moderate ischemia. Would seem out of proportion to degree of his systolic dysfunction. He has not chest pain, no SOB/DOE - he stopped his medical therapy on his own, we have been reinitaiting  - medical therapy had been limted by low bp's during HD, this has resolved. Will try increase toprol to 37.5mg  daily - repeat echo, if ongoing function would plan for heart cath if patient agrees   I have reviewed the risks, indications, and alternatives to cardiac catheterization, possible angioplasty, and stenting with the patient today. Risks include but are not limited to bleeding, infection,  vascular injury, stroke, myocardial infection, arrhythmia, kidney injury, radiation-related injury in the case of prolonged fluoroscopy use, emergency cardiac surgery, and death. The patient understands the risks of serious complication is 1-2 in 7412 with diagnostic cardiac cath and 1-2% or less with angioplasty/stenting.       Arnoldo Lenis, M.D.

## 2020-03-11 ENCOUNTER — Other Ambulatory Visit: Payer: Self-pay

## 2020-03-11 ENCOUNTER — Ambulatory Visit (INDEPENDENT_AMBULATORY_CARE_PROVIDER_SITE_OTHER): Payer: Medicare Other

## 2020-03-11 DIAGNOSIS — I5022 Chronic systolic (congestive) heart failure: Secondary | ICD-10-CM

## 2020-03-17 ENCOUNTER — Telehealth: Payer: Self-pay | Admitting: *Deleted

## 2020-03-17 NOTE — Telephone Encounter (Signed)
Pt voiced understanding - routed to pcp  

## 2020-03-17 NOTE — Telephone Encounter (Signed)
-----   Message from Arnoldo Lenis, MD sent at 03/17/2020 10:36 AM EDT ----- Some improvement in heart function, its up to 40-45% (normal is 50-60%). Will discuss further at our f/u.    Zandra Abts MD

## 2020-05-01 ENCOUNTER — Ambulatory Visit (INDEPENDENT_AMBULATORY_CARE_PROVIDER_SITE_OTHER): Payer: Medicare Other | Admitting: Cardiology

## 2020-05-01 ENCOUNTER — Other Ambulatory Visit: Payer: Self-pay

## 2020-05-01 ENCOUNTER — Encounter: Payer: Self-pay | Admitting: Cardiology

## 2020-05-01 VITALS — BP 112/50 | HR 77 | Ht 74.0 in | Wt 186.0 lb

## 2020-05-01 DIAGNOSIS — I5022 Chronic systolic (congestive) heart failure: Secondary | ICD-10-CM

## 2020-05-01 MED ORDER — METOPROLOL SUCCINATE ER 50 MG PO TB24
50.0000 mg | ORAL_TABLET | Freq: Every day | ORAL | 1 refills | Status: DC
Start: 2020-05-01 — End: 2020-12-30

## 2020-05-01 NOTE — Progress Notes (Signed)
Clinical Summary Mr. Vinsant is a 54 y.o.male seen today for follow up of the following medical problems.   1.Chronic systolic HF - newly diagnosed in 2018, followed at [facility] previously - - echo 2018 LVEF 25-30% - 05/2019 echo LVEF 30-35% - did not have a cath, I presume due to borderline renal function at the time, had stress test. He has since started dialysis. 05/2017 Lexiscan Nuclear stressinferolateral scar with mild to moderate ischemia, inferolateral akinesia 05/2019 [facility] lexiscan as reported below, essentially same inferolatearl findings - patient reports he declined AICD through [facility].  Restarted meds in January2021.  Had some issues with low bp's with HD last visit, we did not adjust meds - recently reports these issues have resolved, tolerating current regimen  02/2020 echo LVEF 40-45%, grade III DDx. Mild RV dysfunction.  - no recent low bp's with med changes - no SOB/DOE/LE edema  2. ESRD - onHD1.5 years.  Past Medical History:  Diagnosis Date  . ESRD (end stage renal disease) ([place])   . Hypertension   . Peripheral arterial disease ([place])   . Type 2 diabetes mellitus (H[facility])      Allergies  Allergen Reactions  . Shellfish-Derived Products Anaphylaxis  . Wasp Venom Protein Anaphylaxis  . Penicillins      Current Outpatient Medications  Medication Sig Dispense Refill  . calcium acetate (PHOSLO) 667 MG capsule Take 1 capsule by mouth 3 (three) times daily.    . Eluxadoline (VIBERZI) 75 MG TABS Take 1 tablet by mouth in the morning and at bedtime.    Marland Kitchen losartan (COZAAR) 25 MG tablet Take 0.5 tablets (12.5 mg total) by mouth daily. 45 tablet 3  . metoprolol succinate (TOPROL XL) 25 MG 24 hr tablet Take 1.5 tablets (37.5 mg total) by mouth daily. 135 tablet 1   No current facility-administered medications for this visit.     Past Surgical History:  Procedure Laterality Date  . AV FISTULA PLACEMENT Right   . PARACENTESIS        Allergies  Allergen Reactions  . Shellfish-Derived Products Anaphylaxis  . Wasp Venom Protein Anaphylaxis  . Penicillins       Family History  Problem Relation Age of Onset  . Thyroid cancer Mother   . Leukemia Father      Social History Mr. Wild reports that he has never smoked. He has never used smokeless tobacco. Mr. Muzquiz has no history on file for alcohol use.   Review of Systems CONSTITUTIONAL: No weight loss, fever, chills, weakness or fatigue.  HEENT: Eyes: No visual loss, blurred vision, double vision or yellow sclerae.No hearing loss, sneezing, congestion, runny nose or sore throat.  SKIN: No rash or itching.  CARDIOVASCULAR: per hpi RESPIRATORY: No shortness of breath, cough or sputum.  GASTROINTESTINAL: No anorexia, nausea, vomiting or diarrhea. No abdominal pain or blood.  GENITOURINARY: No burning on urination, no polyuria NEUROLOGICAL: No headache, dizziness, syncope, paralysis, ataxia, numbness or tingling in the extremities. No change in bowel or bladder control.  MUSCULOSKELETAL: No muscle, back pain, joint pain or stiffness.  LYMPHATICS: No enlarged nodes. No history of splenectomy.  PSYCHIATRIC: No history of depression or anxiety.  ENDOCRINOLOGIC: No reports of sweating, cold or heat intolerance. No polyuria or polydipsia.  Marland Kitchen   Physical Examination Today's Vitals   05/01/20 1302  BP: (!) 112/50  Pulse: 77  SpO2: 99%  Weight: 186 lb (84.4 kg)  Height: 6\' 2"  (1.88 m)   Body mass index is 23.88 kg/m.  Gen: resting comfortably, no acute distress HEENT: no scleral icterus, pupils equal round and reactive, no palptable cervical adenopathy,  CV: RRR, no m/r/g,no jvd Resp: Clear to auscultation bilaterally GI: abdomen is soft, non-tender, non-distended, normal bowel sounds, no hepatosplenomegaly MSK: extremities are warm, no edema.  Skin: warm, no rash Neuro:  no focal deficits Psych: appropriate affect   Diagnostic  Studies  05/2019 echo [facility] ECHOCARDIOGRAPHIC DESCRIPTIONS ----------------------------------------------- AORTIC ROOT    Size: Normal Dissection: INDETERM FOR DISSECTION  AORTIC VALVE  Leaflets: Tricuspid       Morphology: Normal  Mobility: Fully Mobile  LEFT VENTRICLE                   Anterior: HYPOCONTRACTILE    Size: Normal                 Lateral: HYPOCONTRACTILE Contraction: MOD GLOBAL DECREASE           Septal: HYPOCONTRACTILE Closest EF: 35% (Estimated) Calc.EF: 38% (3D)   Apical: HYPOCONTRACTILE  LV masses: No Masses               Inferior: HYPOCONTRACTILE     LVH: None                 Posterior: HYPOCONTRACTILE LV GLS(AVG): -9.5% Normal Range [ <= -16] Dias.FxClass: RESTRICTIVE FILLING PATTERN (GRADE 3) CORRESPONDS TO REVERSIBLE       RESTRICTIVE PATTERN  MITRAL VALVE  Leaflets: Normal         Mobility: Fully mobile Morphology: Normal  LEFT ATRIUM    Size: MILDLY ENLARGED  LA masses: No masses       Normal IAS  MAIN PA    Size: MILDLY DILATED  PULMONIC VALVE Morphology: Normal  Mobility: Fully Mobile  RIGHT VENTRICLE    Size: MILDLY ENLARGED      Free wall: HYPOCONTRACTILE Contraction: MILD GLOBAL DECREASE   RV masses: No Masses    TAPSE:  1.6 cm, Normal Range [>= 1.6 cm]   RV Note: RV S'= 8 cm/s  TRICUSPID VALVE  Leaflets: Normal         Mobility: Fully mobile Morphology: Normal  RIGHT ATRIUM    Size: MODERATELY ENLARGED    RA Other: None  RA masses: No masses  PERICARDIUM    Fluid: No effusion Pleural Eff: PLEURAL EFFUSION NOTED  INFERIOR VENACAVA    Size: DILATED  ABNORMAL RESPIRATORY COLLAPSE  DOPPLER ECHO and OTHER SPECIAL PROCEDURES ------------------------------------  Aortic: No AR         No AS   Mitral:  MILD MR        No MS  MV Inflow E Vel.= 95.0 cm/s MV Annulus E'Vel.= 7.0 cm/s E/E'Ratio= 14  Tricuspid: MODERATE TR      No TS      3.4 m/s peak TR vel  61 mmHg peak RV pressure  Pulmonary: TRIVIAL PR       No PS  MODERATE LV DYSFUNCTION (See above) ELEVATED LA PRESSURES WITH DIASTOLIC DYSFUNCTION MILD RV SYSTOLIC DYSFUNCTION (See above) VALVULAR REGURGITATION: MILD MR, TRIVIAL PR, MODERATE TR NO VALVULAR STENOSIS NO PRIOR STUDY FOR COMPARISON 3D acquisition and reconstructions were performed as part of this examination to more accurately quantify the effects of reduced left ventricular ejection fraction.   05/2019 nuclear stress Image Analysis:   Rest Stress Conclusion Basal Anterior Normal Normal Normal Basal Ant Septal Normal Normal Normal Basal Inf Septal Normal Normal Normal Basal Inferior Normal Normal Normal Basal Inf Lat Mild Moderate  Infrc/Isch Basal Ant Lat Normal Normal Normal Mid Anterior Normal Normal Normal Mid Ant Septal Normal Normal Normal Mid Inf Septal Normal Normal Normal Mid Inferior Normal Normal Normal Mid Inf Lateral Mild Moderate Infrc/Isch Mid Ant Lateral Normal Normal Normal Apical Anterior Normal Normal Normal Apical Septal Normal Normal Normal Apical Inferior Normal Normal Normal Apical Lateral Normal Normal Normal Apex Normal Normal Normal SPECT RESULTS Technical Quality: Good  PERFUSION: Abnormal rest and stress perfusion.   STRESS TEST Pharmacologic Protocol: Regadenoson Dose: 0.4 mg Stage Dose Time (min) BP (mmHg) HR (bpm) Comments  SUPINE 00:58 161 / 82 81  DOSE 1 0.4 01:00 82  RECOV 1 01:00 139 / 61 85 Feels drained  RECOV 2 01:00 85 Aminophylline 75 mg IV  RECOV 3 01:00 85  RECOV 4 01:40 156 / 87 83 Feels better   Resting HR (bpm): 81 Resting BP (mmHg): 161 / 82 MaxPHR: 167 Target HR (bpm): 142 Peak HR (bpm): 85 Peak BP (mmHg): 139  / 61 % MaxPHR: 51 Double Product: 14970 BP Response: Normal blood pressure response during stress Stress Termination: Planned Stress Symptoms: No CP reported.   ECG Negative ECG Interpretation: Heart rate is N/A  IMAGE PROTOCOL Rest/Stress 1 Day Modality: GE 530c Radiopharmaceutical Dose (mCi) Imaging Date Inj Time Img Time Rescan Reason Rescan Time Rest: Tc-76m Tetrofosmin, 6.6 06/19/2019 1319 1400 IV Stress: Tc-15m Tetrofosmin, 18 06/19/2019 1432 1515 IV Rest Administration Site: Left Forearm Tech Administering Rest Dose: Wenda Low, CNMT Stress Administration Site: Left Forearm Tech Administering Stress Dose: Wenda Low, CNMT  FUNCTIONAL RESULTS (calculated via Gated SPECT) Post Stress Image LV EF: 36 % Stress EDV: 176 ml EDVI: 83 ml/m TID: Stress ESV: 112 ml ESVI: 53 ml/m Wall Motion Wall Thickening Anterior: Normal Normal Apex: Normal Normal Inferior: Severe - Hypokinetic Abnormal Lateral: Mild - Hypokinetic Normal Septal: Normal Normal  LV Ejection Fraction &Size: Reduced left ventricular ejection fraction.   LV Function &Wall Thickening: Severely reduced left ventricular systolic function.    02/2020 echo IMPRESSIONS    1. Left ventricular ejection fraction, by estimation, is 40 to 45%. The  left ventricle has mild to moderately decreased function. The left  ventricle demonstrates global hypokinesis. Left ventricular diastolic  parameters are consistent with Grade III  diastolic dysfunction (restrictive). Elevated left ventricular  end-diastolic pressure.  2. Right ventricular systolic function is mildly reduced. The right  ventricular size is mildly enlarged. There is moderately elevated  pulmonary artery systolic pressure.  3. Right atrial size was mildly dilated.  4. The mitral valve is grossly normal. Mild mitral valve regurgitation.  5. Tricuspid valve regurgitation is moderate.  6. The aortic valve is tricuspid.  Aortic valve regurgitation is not  visualized. No aortic stenosis is present.  7. The inferior vena cava is dilated in size with <50% respiratory  variability, suggesting right atrial pressure of 15 mmHg.   Assessment and Plan  1.Chronic systolic HF - diagnosed in 2018 at [facility] - has not had a cath, I presume due to poor renal function at the time. He has since started HD - prior stress tests have showninferolateral infarct with mild to moderate ischemia. Would seem out of proportion to degree of his systolic dysfunction.  - he stopped his medical therapy on his own, we have been reinitaiting  - medical therapy had been limted by low bp's during HD, this has resolved recently - increase toprol to 50mg  daily - he had been reluctant during last visit about potential cath, since significant  improvement in his LVEF reasonable to continuet to tread medically and not pursue cath at this time  F/u 6 week for further titration of CHF meds, likely increase losartan next visit      Arnoldo Lenis, M.D.

## 2020-05-01 NOTE — Patient Instructions (Signed)
Your physician recommends that you schedule a follow-up appointment in: [facility] has recommended you make the following change in your medication:   INCREASE TOPROL XL 50 MG DAILY   Thank you for choosing [facility]!!

## 2020-06-08 ENCOUNTER — Ambulatory Visit (INDEPENDENT_AMBULATORY_CARE_PROVIDER_SITE_OTHER): Payer: Medicare Other | Admitting: Cardiology

## 2020-06-08 ENCOUNTER — Encounter: Payer: Self-pay | Admitting: Cardiology

## 2020-06-08 VITALS — BP 118/60 | HR 60 | Ht 74.0 in | Wt 188.0 lb

## 2020-06-08 DIAGNOSIS — I5022 Chronic systolic (congestive) heart failure: Secondary | ICD-10-CM

## 2020-06-08 MED ORDER — LOSARTAN POTASSIUM 25 MG PO TABS: 25.0000 mg | ORAL_TABLET | Freq: Every day | ORAL | 1 refills | Status: DC

## 2020-06-08 NOTE — Progress Notes (Signed)
Clinical Summary Mr. Gombos is a 54 y.o.male seen today for follow up of the following medical problems.   1.Chronic systolic HF - newly diagnosed in 2018, followed at [facility] previously - - echo 2018 LVEF 25-30% - 05/2019 echo LVEF 30-35% - did not have a cath, I presume due to borderline renal function at the time, had stress test. He has since started dialysis. 05/2017 Lexiscan Nuclear stressinferolateral scar with mild to moderate ischemia, inferolateral akinesia 05/2019 [facility] lexiscan as reported below, essentially same inferolatearl findings - patient reports he declined [facility]ICD through [facility].  Restarted meds in January2021.  Had some issues with low bp's with HD last visit, we did not adjust meds - recently reports these issues have resolved, tolerating current regimen  02/2020 echo LVEF 40-45%, grade III DDx. Mild RV dysfunction.    - mild fatigue after HD with beta blocker change. No issues with low bp's - no SOB or DOE, no LE edema   2. ESRD - onHD1.5 years.   Past Medical History:  Diagnosis Date  . ESRD (end stage renal disease) ([place])   . Hypertension   . Peripheral arterial disease ([place])   . Type 2 diabetes mellitus (H[facility])      Allergies  Allergen Reactions  . Shellfish-Derived Products Anaphylaxis  . Wasp Venom Protein Anaphylaxis  . Penicillins      Current Outpatient Medications  Medication Sig Dispense Refill  . calcium acetate (PHOSLO) 667 MG capsule Take 1 capsule by mouth 3 (three) times daily.    . Eluxadoline (VIBERZI) 75 MG TABS Take 1 tablet by mouth in the morning and at bedtime.    . furosemide (LASIX) 80 MG tablet Take 1 tablet by mouth as needed for fluid.    Marland Kitchen losartan (COZAAR) 25 MG tablet Take 0.5 tablets (12.5 mg total) by mouth daily. 45 tablet 3  . metoprolol succinate (TOPROL-XL) 50 MG 24 hr tablet Take 1 tablet (50 mg total) by mouth daily. Take with or immediately following a meal. 90 tablet 1   No current  facility-administered medications for this visit.     Past Surgical History:  Procedure Laterality Date  . AV FISTULA PLACEMENT Right   . PARACENTESIS       Allergies  Allergen Reactions  . Shellfish-Derived Products Anaphylaxis  . Wasp Venom Protein Anaphylaxis  . Penicillins       Family History  Problem Relation Age of Onset  . Thyroid cancer Mother   . Leukemia Father      Social History Mr. Albornoz reports that he has never smoked. He has never used smokeless tobacco. Mr. Hargadon has no history on file for alcohol use.   Review of Systems CONSTITUTIONAL: No weight loss, fever, chills, weakness or fatigue.  HEENT: Eyes: No visual loss, blurred vision, double vision or yellow sclerae.No hearing loss, sneezing, congestion, runny nose or sore throat.  SKIN: No rash or itching.  CARDIOVASCULAR: per hpi RESPIRATORY: No shortness of breath, cough or sputum.  GASTROINTESTINAL: No anorexia, nausea, vomiting or diarrhea. No abdominal pain or blood.  GENITOURINARY: No burning on urination, no polyuria NEUROLOGICAL: No headache, dizziness, syncope, paralysis, ataxia, numbness or tingling in the extremities. No change in bowel or bladder control.  MUSCULOSKELETAL: No muscle, back pain, joint pain or stiffness.  LYMPHATICS: No enlarged nodes. No history of splenectomy.  PSYCHIATRIC: No history of depression or anxiety.  ENDOCRINOLOGIC: No reports of sweating, cold or heat intolerance. No polyuria or polydipsia.  Marland Kitchen  Physical Examination Today's Vitals   06/08/20 0947  BP: 118/60  Pulse: 60  SpO2: 98%  Weight: 188 lb (85.3 kg)  Height: 6\' 2"  (1.88 m)   Body mass index is 24.14 kg/m.  Gen: resting comfortably, no acute distress HEENT: no scleral icterus, pupils equal round and reactive, no palptable cervical adenopathy,  CV: RRR, no m/r/g, no jvd Resp: Clear to auscultation bilaterally GI: abdomen is soft, non-tender, non-distended, normal bowel sounds, no  hepatosplenomegaly MSK: extremities are warm, no edema.  Skin: warm, no rash Neuro:  no focal deficits Psych: appropriate affect   Diagnostic Studies  05/2019 echo [facility] ECHOCARDIOGRAPHIC DESCRIPTIONS ----------------------------------------------- AORTIC ROOT    Size: Normal Dissection: INDETERM FOR DISSECTION  AORTIC VALVE  Leaflets: Tricuspid       Morphology: Normal  Mobility: Fully Mobile  LEFT VENTRICLE                   Anterior: HYPOCONTRACTILE    Size: Normal                 Lateral: HYPOCONTRACTILE Contraction: MOD GLOBAL DECREASE           Septal: HYPOCONTRACTILE Closest EF: 35% (Estimated) Calc.EF: 38% (3D)   Apical: HYPOCONTRACTILE  LV masses: No Masses               Inferior: HYPOCONTRACTILE     LVH: None                 Posterior: HYPOCONTRACTILE LV GLS(AVG): -9.5% Normal Range [ <= -16] Dias.FxClass: RESTRICTIVE FILLING PATTERN (GRADE 3) CORRESPONDS TO REVERSIBLE       RESTRICTIVE PATTERN  MITRAL VALVE  Leaflets: Normal         Mobility: Fully mobile Morphology: Normal  LEFT ATRIUM    Size: MILDLY ENLARGED  LA masses: No masses       Normal IAS  MAIN PA    Size: MILDLY DILATED  PULMONIC VALVE Morphology: Normal  Mobility: Fully Mobile  RIGHT VENTRICLE    Size: MILDLY ENLARGED      Free wall: HYPOCONTRACTILE Contraction: MILD GLOBAL DECREASE   RV masses: No Masses    TAPSE:  1.6 cm, Normal Range [>= 1.6 cm]   RV Note: RV S'= 8 cm/s  TRICUSPID VALVE  Leaflets: Normal         Mobility: Fully mobile Morphology: Normal  RIGHT ATRIUM    Size: MODERATELY ENLARGED    RA Other: None  RA masses: No masses  PERICARDIUM    Fluid: No effusion Pleural Eff: PLEURAL EFFUSION NOTED  INFERIOR VENACAVA    Size: DILATED  ABNORMAL RESPIRATORY  COLLAPSE  DOPPLER ECHO and OTHER SPECIAL PROCEDURES ------------------------------------  Aortic: No AR         No AS   Mitral: MILD MR        No MS  MV Inflow E Vel.= 95.0 cm/s MV Annulus E'Vel.= 7.0 cm/s E/E'Ratio= 14  Tricuspid: MODERATE TR      No TS      3.4 m/s peak TR vel  61 mmHg peak RV pressure  Pulmonary: TRIVIAL PR       No PS  MODERATE LV DYSFUNCTION (See above) ELEVATED LA PRESSURES WITH DIASTOLIC DYSFUNCTION MILD RV SYSTOLIC DYSFUNCTION (See above) VALVULAR REGURGITATION: MILD MR, TRIVIAL PR, MODERATE TR NO VALVULAR STENOSIS NO PRIOR STUDY FOR COMPARISON 3D acquisition and reconstructions were performed as part of this examination to more accurately quantify the effects of reduced left ventricular ejection fraction.  05/2019 nuclear stress Image Analysis:   Rest Stress Conclusion Basal Anterior Normal Normal Normal Basal Ant Septal Normal Normal Normal Basal Inf Septal Normal Normal Normal Basal Inferior Normal Normal Normal Basal Inf Lat Mild Moderate Infrc/Isch Basal Ant Lat Normal Normal Normal Mid Anterior Normal Normal Normal Mid Ant Septal Normal Normal Normal Mid Inf Septal Normal Normal Normal Mid Inferior Normal Normal Normal Mid Inf Lateral Mild Moderate Infrc/Isch Mid Ant Lateral Normal Normal Normal Apical Anterior Normal Normal Normal Apical Septal Normal Normal Normal Apical Inferior Normal Normal Normal Apical Lateral Normal Normal Normal Apex Normal Normal Normal SPECT RESULTS Technical Quality: Good  PERFUSION: Abnormal rest and stress perfusion.   STRESS TEST Pharmacologic Protocol: Regadenoson Dose: 0.4 mg Stage Dose Time (min) BP (mmHg) HR (bpm) Comments  SUPINE 00:58 161 / 82 81  DOSE 1 0.4 01:00 82  RECOV 1 01:00 139 / 61 85 Feels drained  RECOV 2 01:00 85 Aminophylline 75 mg IV  RECOV 3 01:00 85  RECOV 4 01:40 156 /  87 83 Feels better   Resting HR (bpm): 81 Resting BP (mmHg): 161 / 82 MaxPHR: 167 Target HR (bpm): 142 Peak HR (bpm): 85 Peak BP (mmHg): 139 / 61 % MaxPHR: 51 Double Product: 40086 BP Response: Normal blood pressure response during stress Stress Termination: Planned Stress Symptoms: No CP reported.   ECG Negative ECG Interpretation: Heart rate is N/A  IMAGE PROTOCOL Rest/Stress 1 Day Modality: GE 530c Radiopharmaceutical Dose (mCi) Imaging Date Inj Time Img Time Rescan Reason Rescan Time Rest: Tc-28m Tetrofosmin, 6.6 06/19/2019 1319 1400 IV Stress: Tc-58m Tetrofosmin, 18 06/19/2019 1432 1515 IV Rest Administration Site: Left Forearm Tech Administering Rest Dose: Wenda Low, CNMT Stress Administration Site: Left Forearm Tech Administering Stress Dose: Wenda Low, CNMT  FUNCTIONAL RESULTS (calculated via Gated SPECT) Post Stress Image LV EF: 36 % Stress EDV: 176 ml EDVI: 83 ml/m TID: Stress ESV: 112 ml ESVI: 53 ml/m Wall Motion Wall Thickening Anterior: Normal Normal Apex: Normal Normal Inferior: Severe - Hypokinetic Abnormal Lateral: Mild - Hypokinetic Normal Septal: Normal Normal  LV Ejection Fraction &Size: Reduced left ventricular ejection fraction.   LV Function &Wall Thickening: Severely reduced left ventricular systolic function.   02/2020 echo IMPRESSIONS    1. Left ventricular ejection fraction, by estimation, is 40 to 45%. The  left ventricle has mild to moderately decreased function. The left  ventricle demonstrates global hypokinesis. Left ventricular diastolic  parameters are consistent with Grade III  diastolic dysfunction (restrictive). Elevated left ventricular  end-diastolic pressure.  2. Right ventricular systolic function is mildly reduced. The right  ventricular size is mildly enlarged. There is moderately elevated  pulmonary artery systolic pressure.  3. Right atrial size was mildly dilated.  4. The  mitral valve is grossly normal. Mild mitral valve regurgitation.  5. Tricuspid valve regurgitation is moderate.  6. The aortic valve is tricuspid. Aortic valve regurgitation is not  visualized. No aortic stenosis is present.  7. The inferior vena cava is dilated in size with <50% respiratory  variability, suggesting right atrial pressure of 15 mmHg.    Assessment and Plan   1.Chronic systolic HF - diagnosed in 2018 at [facility] - has not had a cath, I presume due to poor renal function at the time. He has since started HD - prior stress tests have showninferolateral infarct with mild to moderate ischemia. Would seem out of proportion to degree of his systolic dysfunction.  - he stopped his medical therapy on his own, we  have been reinitaiting  -medical therapy had been limted by low bp's during HD, this has resolved recently - we will incraese losartan to 25mg  daily. Do not think bp's at this time would tolerate entresto.    F/u 3 months. Likely repeat echo around that time. If ongoing improvement in cardiac function may be reconsidered for renal transplant evaluation at [facility]      Arnoldo Lenis, M.D.

## 2020-06-08 NOTE — Patient Instructions (Signed)
Your physician recommends that you schedule a follow-up appointment in: [place] has recommended you make the following change in your medication:   INCREASE LOSARTAN 25 MG DAILY   Thank you for choosing [facility]!!

## 2020-09-22 ENCOUNTER — Ambulatory Visit (INDEPENDENT_AMBULATORY_CARE_PROVIDER_SITE_OTHER): Payer: Medicare Other | Admitting: Cardiology

## 2020-09-22 ENCOUNTER — Encounter: Payer: Self-pay | Admitting: Cardiology

## 2020-09-22 VITALS — BP 100/50 | HR 56 | Ht 74.0 in | Wt 184.6 lb

## 2020-09-22 DIAGNOSIS — I5022 Chronic systolic (congestive) heart failure: Secondary | ICD-10-CM | POA: Diagnosis not present

## 2020-09-22 MED ORDER — LOSARTAN POTASSIUM 25 MG PO TABS: 12.5000 mg | ORAL_TABLET | Freq: Every day | ORAL | 1 refills | Status: DC

## 2020-09-22 NOTE — Patient Instructions (Addendum)
Your physician recommends that you schedule a follow-up appointment in: [place] has recommended you make the following change in your medication:   START LOSARTAN 12.5 MG DAILY   Thank you for choosing [facility]!!

## 2020-09-22 NOTE — Progress Notes (Signed)
Clinical Summary David Rose is a 54 y.o.male seen today for follow up of the following medical problems.  1.Chronic systolic HF - newly diagnosed in 2018, followed at [facility] previously - - echo 2018 LVEF 25-30% - 05/2019 echo LVEF 30-35% - did not have a cath, I presume due to borderline renal function at the time, had stress test. He has since started dialysis. 05/2017 Lexiscan Nuclear stressinferolateral scar with mild to moderate ischemia, inferolateral akinesia 05/2019 [facility] lexiscan as reported below, essentially same inferolatearl findings - patient reports he declined AICD through [facility].  Restarted meds in January2021.    02/2020 echo LVEF 40-45%, grade III DDx. Mild RV dysfunction. - last visit we increased losartan to 25mg  daily.   - recent issues with GI upset, poor intake. He did stop his toprol and losartan for a period - back on toprol 50mg  daily, has not restarted losartan.  - bp's doing ok with HD.  - no recent issues with edema, no sob or DOE.    2. ESRD - onHD1.5 years.  COVID vaccine x 2 moderna.     Past Medical History:  Diagnosis Date  . ESRD (end stage renal disease) ([place])   . Hypertension   . Peripheral arterial disease ([place])   . Type 2 diabetes mellitus (HCC)      Allergies  Allergen Reactions  . Shellfish-Derived Products Anaphylaxis  . Wasp Venom Protein Anaphylaxis  . Penicillins      Current Outpatient Medications  Medication Sig Dispense Refill  . calcium acetate (PHOSLO) 667 MG capsule Take 1 capsule by mouth 3 (three) times daily.    . Eluxadoline (VIBERZI) 75 MG TABS Take 1 tablet by mouth in the morning and at bedtime.    . furosemide (LASIX) 80 MG tablet Take 1 tablet by mouth as needed for fluid.    Marland Kitchen losartan (COZAAR) 25 MG tablet Take 1 tablet (25 mg total) by mouth daily. 90 tablet 1  . metoprolol succinate (TOPROL-XL) 50 MG 24 hr tablet Take 1 tablet (50 mg total) by mouth daily. Take with or  immediately following a meal. 90 tablet 1   No current facility-administered medications for this visit.     Past Surgical History:  Procedure Laterality Date  . AV FISTULA PLACEMENT Right   . PARACENTESIS       Allergies  Allergen Reactions  . Shellfish-Derived Products Anaphylaxis  . Wasp Venom Protein Anaphylaxis  . Penicillins       Family History  Problem Relation Age of Onset  . Thyroid cancer Mother   . Leukemia Father      Social History David Rose reports that he has never smoked. He has never used smokeless tobacco. David Rose has no history on file for alcohol use.   Review of Systems CONSTITUTIONAL: No weight loss, fever, chills, weakness or fatigue.  HEENT: Eyes: No visual loss, blurred vision, double vision or yellow sclerae.No hearing loss, sneezing, congestion, runny nose or sore throat.  SKIN: No rash or itching.  CARDIOVASCULAR: per hpi RESPIRATORY: No shortness of breath, cough or sputum.  GASTROINTESTINAL: No anorexia, nausea, vomiting or diarrhea. No abdominal pain or blood.  GENITOURINARY: No burning on urination, no polyuria NEUROLOGICAL: No headache, dizziness, syncope, paralysis, ataxia, numbness or tingling in the extremities. No change in bowel or bladder control.  MUSCULOSKELETAL: No muscle, back pain, joint pain or stiffness.  LYMPHATICS: No enlarged nodes. No history of splenectomy.  PSYCHIATRIC: No history of depression or anxiety.  ENDOCRINOLOGIC:  No reports of sweating, cold or heat intolerance. No polyuria or polydipsia.  Marland Kitchen   Physical Examination Today's Vitals   09/22/20 1122  BP: (!) 100/50  Pulse: (!) 56  SpO2: 97%  Weight: 184 lb 9.6 oz (83.7 kg)  Height: 6\' 2"  (1.88 m)   Body mass index is 23.7 kg/m.  Gen: resting comfortably, no acute distress HEENT: no scleral icterus, pupils equal round and reactive, no palptable cervical adenopathy,  CV: RRR, no m/r/g, no jvd Resp: Clear to auscultation bilaterally GI:  abdomen is soft, non-tender, non-distended, normal bowel sounds, no hepatosplenomegaly MSK: extremities are warm, no edema.  Skin: warm, no rash Neuro:  no focal deficits Psych: appropriate affect   Diagnostic Studies 05/2019 echo [facility] ECHOCARDIOGRAPHIC DESCRIPTIONS ----------------------------------------------- AORTIC ROOT    Size: Normal Dissection: INDETERM FOR DISSECTION  AORTIC VALVE  Leaflets: Tricuspid       Morphology: Normal  Mobility: Fully Mobile  LEFT VENTRICLE                   Anterior: HYPOCONTRACTILE    Size: Normal                 Lateral: HYPOCONTRACTILE Contraction: MOD GLOBAL DECREASE           Septal: HYPOCONTRACTILE Closest EF: 35% (Estimated) Calc.EF: 38% (3D)   Apical: HYPOCONTRACTILE  LV masses: No Masses               Inferior: HYPOCONTRACTILE     LVH: None                 Posterior: HYPOCONTRACTILE LV GLS(AVG): -9.5% Normal Range [ <= -16] Dias.FxClass: RESTRICTIVE FILLING PATTERN (GRADE 3) CORRESPONDS TO REVERSIBLE       RESTRICTIVE PATTERN  MITRAL VALVE  Leaflets: Normal         Mobility: Fully mobile Morphology: Normal  LEFT ATRIUM    Size: MILDLY ENLARGED  LA masses: No masses       Normal IAS  MAIN PA    Size: MILDLY DILATED  PULMONIC VALVE Morphology: Normal  Mobility: Fully Mobile  RIGHT VENTRICLE    Size: MILDLY ENLARGED      Free wall: HYPOCONTRACTILE Contraction: MILD GLOBAL DECREASE   RV masses: No Masses    TAPSE:  1.6 cm, Normal Range [>= 1.6 cm]   RV Note: RV S'= 8 cm/s  TRICUSPID VALVE  Leaflets: Normal         Mobility: Fully mobile Morphology: Normal  RIGHT ATRIUM    Size: MODERATELY ENLARGED    RA Other: None  RA masses: No masses  PERICARDIUM    Fluid: No effusion Pleural Eff: PLEURAL EFFUSION  NOTED  INFERIOR VENACAVA    Size: DILATED  ABNORMAL RESPIRATORY COLLAPSE  DOPPLER ECHO and OTHER SPECIAL PROCEDURES ------------------------------------  Aortic: No AR         No AS   Mitral: MILD MR        No MS  MV Inflow E Vel.= 95.0 cm/s MV Annulus E'Vel.= 7.0 cm/s E/E'Ratio= 14  Tricuspid: MODERATE TR      No TS      3.4 m/s peak TR vel  61 mmHg peak RV pressure  Pulmonary: TRIVIAL PR       No PS  MODERATE LV DYSFUNCTION (See above) ELEVATED LA PRESSURES WITH DIASTOLIC DYSFUNCTION MILD RV SYSTOLIC DYSFUNCTION (See above) VALVULAR REGURGITATION: MILD MR, TRIVIAL PR, MODERATE TR NO VALVULAR STENOSIS NO PRIOR STUDY FOR COMPARISON 3D acquisition and reconstructions  were performed as part of this examination to more accurately quantify the effects of reduced left ventricular ejection fraction.   05/2019 nuclear stress Image Analysis:   Rest Stress Conclusion Basal Anterior Normal Normal Normal Basal Ant Septal Normal Normal Normal Basal Inf Septal Normal Normal Normal Basal Inferior Normal Normal Normal Basal Inf Lat Mild Moderate Infrc/Isch Basal Ant Lat Normal Normal Normal Mid Anterior Normal Normal Normal Mid Ant Septal Normal Normal Normal Mid Inf Septal Normal Normal Normal Mid Inferior Normal Normal Normal Mid Inf Lateral Mild Moderate Infrc/Isch Mid Ant Lateral Normal Normal Normal Apical Anterior Normal Normal Normal Apical Septal Normal Normal Normal Apical Inferior Normal Normal Normal Apical Lateral Normal Normal Normal Apex Normal Normal Normal SPECT RESULTS Technical Quality: Good  PERFUSION: Abnormal rest and stress perfusion.   STRESS TEST Pharmacologic Protocol: Regadenoson Dose: 0.4 mg Stage Dose Time (min) BP (mmHg) HR (bpm) Comments  SUPINE 00:58 161 / 82 81  DOSE 1 0.4 01:00 82  RECOV 1 01:00 139 / 61 85 Feels drained  RECOV 2 01:00  85 Aminophylline 75 mg IV  RECOV 3 01:00 85  RECOV 4 01:40 156 / 87 83 Feels better   Resting HR (bpm): 81 Resting BP (mmHg): 161 / 82 MaxPHR: 167 Target HR (bpm): 142 Peak HR (bpm): 85 Peak BP (mmHg): 139 / 61 % MaxPHR: 51 Double Product: [id number] BP Response: Normal blood pressure response during stress Stress Termination: Planned Stress Symptoms: No CP reported.   ECG Negative ECG Interpretation: Heart rate is N/A  IMAGE PROTOCOL Rest/Stress 1 Day Modality: GE 530c Radiopharmaceutical Dose (mCi) Imaging Date Inj Time Img Time Rescan Reason Rescan Time Rest: Tc-87m Tetrofosmin, 6.6 06/19/2019 1319 1400 IV Stress: Tc-34m Tetrofosmin, 18 06/19/2019 1432 1515 IV Rest Administration Site: Left Forearm Tech Administering Rest Dose: Wenda Low, CNMT Stress Administration Site: Left Forearm Tech Administering Stress Dose: Wenda Low, CNMT  FUNCTIONAL RESULTS (calculated via Gated SPECT) Post Stress Image LV EF: 36 % Stress EDV: 176 ml EDVI: 83 ml/m TID: Stress ESV: 112 ml ESVI: 53 ml/m Wall Motion Wall Thickening Anterior: Normal Normal Apex: Normal Normal Inferior: Severe - Hypokinetic Abnormal Lateral: Mild - Hypokinetic Normal Septal: Normal Normal  LV Ejection Fraction &Size: Reduced left ventricular ejection fraction.   LV Function &Wall Thickening: Severely reduced left ventricular systolic function.   02/2020 echo IMPRESSIONS    1. Left ventricular ejection fraction, by estimation, is 40 to 45%. The  left ventricle has mild to moderately decreased function. The left  ventricle demonstrates global hypokinesis. Left ventricular diastolic  parameters are consistent with Grade III  diastolic dysfunction (restrictive). Elevated left ventricular  end-diastolic pressure.  2. Right ventricular systolic function is mildly reduced. The right  ventricular size is mildly enlarged. There is moderately elevated  pulmonary artery  systolic pressure.  3. Right atrial size was mildly dilated.  4. The mitral valve is grossly normal. Mild mitral valve regurgitation.  5. Tricuspid valve regurgitation is moderate.  6. The aortic valve is tricuspid. Aortic valve regurgitation is not  visualized. No aortic stenosis is present.  7. The inferior vena cava is dilated in size with <50% respiratory  variability, suggesting right atrial pressure of 15 mmHg.      Assessment and Plan   1.Chronic systolic HF - diagnosed in 2018 at [facility] - has not had a cath, I presume due to poor renal function at the time. He has since started HD - prior stress tests have showninferolateral infarct with mild to moderate  ischemia. Would seem out of proportion to degree of his systolic dysfunction.  - he stopped his medical therapy on his own, we have been reinitaiting  - most recently he had stopped his toprol and losartan for a period due to GI upset and poor intake. Back on toprol 50, we will start back losartan at 12.5mg  daily - repeat echo over next few months. If ongoing improvement in cardiac function may be reconsidered for renal transplant evaluation at [facility].          Arnoldo Lenis, M.D

## 2020-12-30 ENCOUNTER — Encounter: Payer: Self-pay | Admitting: Cardiology

## 2020-12-30 ENCOUNTER — Encounter: Payer: Self-pay | Admitting: *Deleted

## 2020-12-30 ENCOUNTER — Ambulatory Visit (INDEPENDENT_AMBULATORY_CARE_PROVIDER_SITE_OTHER): Payer: Medicare Other | Admitting: Cardiology

## 2020-12-30 VITALS — BP 130/70 | HR 70 | Ht 74.0 in | Wt 178.4 lb

## 2020-12-30 DIAGNOSIS — I5022 Chronic systolic (congestive) heart failure: Secondary | ICD-10-CM

## 2020-12-30 NOTE — Progress Notes (Signed)
Clinical Summary David Rose is a 55 y.o.male seen today for follow up of the following medical problems.  1.Chronic systolic HF - newly diagnosed in 2018, followed at [facility] previously - - echo 2018 LVEF 25-30% - 05/2019 echo LVEF 30-35% - did not have a cath, I presume due to borderline renal function at the time, had stress test. He has since started dialysis. 05/2017 Lexiscan Nuclear stressinferolateral scar with mild to moderate ischemia, inferolateral akinesia 05/2019 [facility] lexiscan as reported below, essentially same inferolatearl findings - patient reports he declined AICD through [facility].  Restarted meds in January2021.    02/2020 echo LVEF 40-45%, grade III DDx. Mild RV dysfunction.  - no issues on HD with bp - no recetn SOB/DOE. Mild LE edema at times.     2. ESRD - onHD1.5 years.  COVID vaccine x 2 moderna.  Past Medical History:  Diagnosis Date  . ESRD (end stage renal disease) ([place])   . Hypertension   . Peripheral arterial disease ([place])   . Type 2 diabetes mellitus (HCC)      Allergies  Allergen Reactions  . Shellfish-Derived Products Anaphylaxis  . Wasp Venom Protein Anaphylaxis  . Penicillins      Current Outpatient Medications  Medication Sig Dispense Refill  . calcium acetate (PHOSLO) 667 MG capsule Take 1 capsule by mouth 3 (three) times daily.    . Eluxadoline (VIBERZI) 75 MG TABS Take 1 tablet by mouth in the morning and at bedtime.    . furosemide (LASIX) 80 MG tablet Take 1 tablet by mouth as needed for fluid.    Marland Kitchen losartan (COZAAR) 25 MG tablet Take 0.5 tablets (12.5 mg total) by mouth daily. 45 tablet 1  . metoprolol succinate (TOPROL-XL) 50 MG 24 hr tablet Take 1 tablet (50 mg total) by mouth daily. Take with or immediately following a meal. 90 tablet 1   No current facility-administered medications for this visit.     Past Surgical History:  Procedure Laterality Date  . AV FISTULA PLACEMENT Right   .  PARACENTESIS       Allergies  Allergen Reactions  . Shellfish-Derived Products Anaphylaxis  . Wasp Venom Protein Anaphylaxis  . Penicillins       Family History  Problem Relation Age of Onset  . Thyroid cancer Mother   . Leukemia Father      Social History Mr. Callum reports that he has never smoked. He has never used smokeless tobacco. Mr. Miggins has no history on file for alcohol use.   Review of Systems CONSTITUTIONAL: No weight loss, fever, chills, weakness or fatigue.  HEENT: Eyes: No visual loss, blurred vision, double vision or yellow sclerae.No hearing loss, sneezing, congestion, runny nose or sore throat.  SKIN: No rash or itching.  CARDIOVASCULAR: per hpi RESPIRATORY: No shortness of breath, cough or sputum.  GASTROINTESTINAL: No anorexia, nausea, vomiting or diarrhea. No abdominal pain or blood.  GENITOURINARY: No burning on urination, no polyuria NEUROLOGICAL: No headache, dizziness, syncope, paralysis, ataxia, numbness or tingling in the extremities. No change in bowel or bladder control.  MUSCULOSKELETAL: No muscle, back pain, joint pain or stiffness.  LYMPHATICS: No enlarged nodes. No history of splenectomy.  PSYCHIATRIC: No history of depression or anxiety.  ENDOCRINOLOGIC: No reports of sweating, cold or heat intolerance. No polyuria or polydipsia.  Marland Kitchen   Physical Examination Today's Vitals   12/30/20 1513  BP: 130/70  Pulse: 70  SpO2: 98%  Weight: 178 lb 6.4 oz (80.9 kg)  Height: '6\' 2"'$  (1.88 m)   Body mass index is 22.91 kg/m.  Gen: resting comfortably, no acute distress HEENT: no scleral icterus, pupils equal round and reactive, no palptable cervical adenopathy,  CV: RRR, no m/r/g no jvd Resp: Clear to auscultation bilaterally GI: abdomen is soft, non-tender, non-distended, normal bowel sounds, no hepatosplenomegaly MSK: extremities are warm, no edema.  Skin: warm, no rash Neuro:  no focal deficits Psych: appropriate  affect   Diagnostic Studies  05/2019 echo [facility] ECHOCARDIOGRAPHIC DESCRIPTIONS ----------------------------------------------- AORTIC ROOT    Size: Normal Dissection: INDETERM FOR DISSECTION  AORTIC VALVE  Leaflets: Tricuspid       Morphology: Normal  Mobility: Fully Mobile  LEFT VENTRICLE                   Anterior: HYPOCONTRACTILE    Size: Normal                 Lateral: HYPOCONTRACTILE Contraction: MOD GLOBAL DECREASE           Septal: HYPOCONTRACTILE Closest EF: 35% (Estimated) Calc.EF: 38% (3D)   Apical: HYPOCONTRACTILE  LV masses: No Masses               Inferior: HYPOCONTRACTILE     LVH: None                 Posterior: HYPOCONTRACTILE LV GLS(AVG): -9.5% Normal Range [ <= -16] Dias.FxClass: RESTRICTIVE FILLING PATTERN (GRADE 3) CORRESPONDS TO REVERSIBLE       RESTRICTIVE PATTERN  MITRAL VALVE  Leaflets: Normal         Mobility: Fully mobile Morphology: Normal  LEFT ATRIUM    Size: MILDLY ENLARGED  LA masses: No masses       Normal IAS  MAIN PA    Size: MILDLY DILATED  PULMONIC VALVE Morphology: Normal  Mobility: Fully Mobile  RIGHT VENTRICLE    Size: MILDLY ENLARGED      Free wall: HYPOCONTRACTILE Contraction: MILD GLOBAL DECREASE   RV masses: No Masses    TAPSE:  1.6 cm, Normal Range [>= 1.6 cm]   RV Note: RV S'= 8 cm/s  TRICUSPID VALVE  Leaflets: Normal         Mobility: Fully mobile Morphology: Normal  RIGHT ATRIUM    Size: MODERATELY ENLARGED    RA Other: None  RA masses: No masses  PERICARDIUM    Fluid: No effusion Pleural Eff: PLEURAL EFFUSION NOTED  INFERIOR VENACAVA    Size: DILATED  ABNORMAL RESPIRATORY COLLAPSE  DOPPLER ECHO and OTHER SPECIAL PROCEDURES ------------------------------------  Aortic: No AR          No AS   Mitral: MILD MR        No MS  MV Inflow E Vel.= 95.0 cm/s MV Annulus E'Vel.= 7.0 cm/s E/E'Ratio= 14  Tricuspid: MODERATE TR      No TS      3.4 m/s peak TR vel  61 mmHg peak RV pressure  Pulmonary: TRIVIAL PR       No PS  MODERATE LV DYSFUNCTION (See above) ELEVATED LA PRESSURES WITH DIASTOLIC DYSFUNCTION MILD RV SYSTOLIC DYSFUNCTION (See above) VALVULAR REGURGITATION: MILD MR, TRIVIAL PR, MODERATE TR NO VALVULAR STENOSIS NO PRIOR STUDY FOR COMPARISON 3D acquisition and reconstructions were performed as part of this examination to more accurately quantify the effects of reduced left ventricular ejection fraction.   05/2019 nuclear stress Image Analysis:   Rest Stress Conclusion Basal Anterior Normal Normal Normal Basal Ant Septal Normal Normal Normal Basal  Inf Septal Normal Normal Normal Basal Inferior Normal Normal Normal Basal Inf Lat Mild Moderate Infrc/Isch Basal Ant Lat Normal Normal Normal Mid Anterior Normal Normal Normal Mid Ant Septal Normal Normal Normal Mid Inf Septal Normal Normal Normal Mid Inferior Normal Normal Normal Mid Inf Lateral Mild Moderate Infrc/Isch Mid Ant Lateral Normal Normal Normal Apical Anterior Normal Normal Normal Apical Septal Normal Normal Normal Apical Inferior Normal Normal Normal Apical Lateral Normal Normal Normal Apex Normal Normal Normal SPECT RESULTS Technical Quality: Good  PERFUSION: Abnormal rest and stress perfusion.   STRESS TEST Pharmacologic Protocol: Regadenoson Dose: 0.4 mg Stage Dose Time (min) BP (mmHg) HR (bpm) Comments  SUPINE 00:58 161 / 82 81  DOSE 1 0.4 01:00 82  RECOV 1 01:00 139 / 61 85 Feels drained  RECOV 2 01:00 85 Aminophylline 75 mg IV  RECOV 3 01:00 85  RECOV 4 01:40 156 / 87 83 Feels better   Resting HR (bpm): 81 Resting BP (mmHg): 161 / 82 MaxPHR: 167 Target HR (bpm): 142 Peak HR (bpm): 85  Peak BP (mmHg): 139 / 61 % MaxPHR: 51 Double Product: [id number] BP Response: Normal blood pressure response during stress Stress Termination: Planned Stress Symptoms: No CP reported.   ECG Negative ECG Interpretation: Heart rate is N/A  IMAGE PROTOCOL Rest/Stress 1 Day Modality: GE 530c Radiopharmaceutical Dose (mCi) Imaging Date Inj Time Img Time Rescan Reason Rescan Time Rest: Tc-69mTetrofosmin, 6.6 06/19/2019 1319 1400 IV Stress: Tc-928metrofosmin, 18 06/19/2019 1432 1515 IV Rest Administration Site: Left Forearm Tech Administering Rest Dose: MaWenda LowCNMT Stress Administration Site: Left Forearm Tech Administering Stress Dose: MaWenda LowCNMT  FUNCTIONAL RESULTS (calculated via Gated SPECT) Post Stress Image LV EF: 36 % Stress EDV: 176 ml EDVI: 83 ml/m TID: Stress ESV: 112 ml ESVI: 53 ml/m Wall Motion Wall Thickening Anterior: Normal Normal Apex: Normal Normal Inferior: Severe - Hypokinetic Abnormal Lateral: Mild - Hypokinetic Normal Septal: Normal Normal  LV Ejection Fraction &Size: Reduced left ventricular ejection fraction.   LV Function &Wall Thickening: Severely reduced left ventricular systolic function.   02/2020 echo IMPRESSIONS    1. Left ventricular ejection fraction, by estimation, is 40 to 45%. The  left ventricle has mild to moderately decreased function. The left  ventricle demonstrates global hypokinesis. Left ventricular diastolic  parameters are consistent with Grade III  diastolic dysfunction (restrictive). Elevated left ventricular  end-diastolic pressure.  2. Right ventricular systolic function is mildly reduced. The right  ventricular size is mildly enlarged. There is moderately elevated  pulmonary artery systolic pressure.  3. Right atrial size was mildly dilated.  4. The mitral valve is grossly normal. Mild mitral valve regurgitation.  5. Tricuspid valve regurgitation is moderate.  6. The aortic  valve is tricuspid. Aortic valve regurgitation is not  visualized. No aortic stenosis is present.  7. The inferior vena cava is dilated in size with <50% respiratory  variability, suggesting right atrial pressure of 15 mmHg.    Assessment and Plan  1.[facility] has not had a cath, I presume due to poor renal function at the time. He has since started HD - prior stress tests have showninferolateral infarct with mild to moderate ischemia. Would seem out of proportion to degree of his systolic dysfunction.  - he stopped his medical therapy on his own, we have been reinitaiting  - we will repeat echo, if ongoign dysfunction work on further titrating meds   F/u pending echo results  Arnoldo Lenis, M.D.

## 2020-12-30 NOTE — Patient Instructions (Signed)
Your physician recommends that you schedule a follow-up appointment in: PENDING   Your physician recommends that you continue on your current medications as directed. Please refer to the Current Medication list given to you today.  Your physician has requested that you have an echocardiogram. Echocardiography is a painless test that uses sound waves to create images of your heart. It provides your doctor with information about the size and shape of your heart and how well your heart's chambers and valves are working. This procedure takes approximately one hour. There are no restrictions for this procedure.  Thank you for choosing [facility]!!    

## 2021-01-27 ENCOUNTER — Ambulatory Visit (INDEPENDENT_AMBULATORY_CARE_PROVIDER_SITE_OTHER): Payer: Medicare Other

## 2021-01-27 DIAGNOSIS — I5022 Chronic systolic (congestive) heart failure: Secondary | ICD-10-CM | POA: Diagnosis not present

## 2021-01-27 LAB — ECHOCARDIOGRAM COMPLETE
AR max vel: 2.08 cm2
AV Area VTI: 2.1 cm2
AV Area mean vel: 2.09 cm2
AV Mean grad: 4 mmHg
AV Peak grad: 8.1 mmHg
Ao pk vel: 1.42 m/s
Area-P 1/2: 4.86 cm2
Calc EF: 35.4 %
MV M vel: 2.59 m/s
MV Peak grad: 26.7 mmHg
S' Lateral: 4.29 cm
Single Plane A2C EF: 39 %
Single Plane A4C EF: 35.2 %

## 2021-02-11 ENCOUNTER — Telehealth: Payer: Self-pay | Admitting: *Deleted

## 2021-02-11 NOTE — Telephone Encounter (Signed)
-----   Message from Arnoldo Lenis, MD sent at 02/09/2021  9:35 AM EDT ----- Echo shows heart pumping remains mildly decreased at 40-45%. 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 Would like to discuss potentially some additional testing. Could he do a virtual visit on Thursday Apr 28 at 1140 to discuss  Carlyle Dolly MD

## 2021-02-11 NOTE — Telephone Encounter (Signed)
Pt voiced understanding - will have schedulers add to Dr Nelly Laurence schedule

## 2021-02-17 ENCOUNTER — Telehealth: Payer: Self-pay | Admitting: Cardiology

## 2021-02-17 NOTE — Telephone Encounter (Signed)
  Patient Consent for Virtual Visit         David Rose. has provided verbal consent on 02/17/2021 for a virtual visit (video or telephone).   CONSENT FOR VIRTUAL VISIT FOR:  David Rose.  By participating in this virtual visit I agree to the following:  I hereby voluntarily request, consent and authorize [facility] and its employed or contracted physicians, physician assistants, nurse practitioners or other licensed health care professionals (the Practitioner), to provide me with telemedicine health care services (the "Services") as deemed necessary by the treating Practitioner. I acknowledge and consent to receive the Services by the Practitioner via telemedicine. I understand that the telemedicine visit will involve communicating with the Practitioner through live audiovisual communication technology and the disclosure of certain medical information by electronic transmission. I acknowledge that I have been given the opportunity to request an in-person assessment or other available alternative prior to the telemedicine visit and am voluntarily participating in the telemedicine visit.  I understand that I have the right to withhold or withdraw my consent to the use of telemedicine in the course of my care at any time, without affecting my right to future care or treatment, and that the Practitioner or I may terminate the telemedicine visit at any time. I understand that I have the right to inspect all information obtained and/or recorded in the course of the telemedicine visit and may receive copies of available information for a reasonable fee.  I understand that some of the potential risks of receiving the Services via telemedicine include:  Marland Kitchen Delay or interruption in medical evaluation due to technological equipment failure or disruption; . Information transmitted may not be sufficient (e.g. poor resolution of images) to allow for appropriate medical decision making by the  Practitioner; and/or  . In rare instances, security protocols could fail, causing a breach of personal health information.  Furthermore, I acknowledge that it is my responsibility to provide information about my medical history, conditions and care that is complete and accurate to the best of my ability. I acknowledge that Practitioner's advice, recommendations, and/or decision may be based on factors not within their control, such as incomplete or inaccurate data provided by me or distortions of diagnostic images or specimens that may result from electronic transmissions. I understand that the practice of medicine is not an exact science and that Practitioner makes no warranties or guarantees regarding treatment outcomes. I acknowledge that a copy of this consent can be made available to me via my patient portal ([facility]), or I can request a printed copy by calling the office of [facility].    I understand that my insurance will be billed for this visit.   I have read or had this consent read to me. . I understand the contents of this consent, which adequately explains the benefits and risks of the Services being provided via telemedicine.  . I have been provided ample opportunity to ask questions regarding this consent and the Services and have had my questions answered to my satisfaction. . I give my informed consent for the services to be provided through the use of telemedicine in my medical care

## 2021-02-18 ENCOUNTER — Encounter: Payer: Self-pay | Admitting: Cardiology

## 2021-02-18 ENCOUNTER — Telehealth: Payer: Self-pay | Admitting: Cardiology

## 2021-02-18 ENCOUNTER — Telehealth (INDEPENDENT_AMBULATORY_CARE_PROVIDER_SITE_OTHER): Payer: Medicare Other | Admitting: Cardiology

## 2021-02-18 VITALS — BP 132/74 | Ht 74.0 in | Wt 178.6 lb

## 2021-02-18 DIAGNOSIS — I5022 Chronic systolic (congestive) heart failure: Secondary | ICD-10-CM

## 2021-02-18 DIAGNOSIS — I272 Pulmonary hypertension, unspecified: Secondary | ICD-10-CM

## 2021-02-18 DIAGNOSIS — N186 End stage renal disease: Secondary | ICD-10-CM | POA: Diagnosis not present

## 2021-02-18 MED ORDER — ENTRESTO 24-26 MG PO TABS: 1.0000 | ORAL_TABLET | Freq: Two times a day (BID) | ORAL | 3 refills | Status: DC

## 2021-02-18 NOTE — Telephone Encounter (Signed)
New message    David Rose called David Rose , David Rose is too much for him to be able to afford , please find another medication

## 2021-02-18 NOTE — Telephone Encounter (Signed)
Pre-cert Verification for the following procedure    L/R [facility] 5/4 @ 8:30AM DR Claiborne Billings

## 2021-02-18 NOTE — Telephone Encounter (Signed)
Pt notified of patient assistance program. Pt would like to apply for program. Will mail patient assistance application to pt. Informed he will need to show proof of income.

## 2021-02-18 NOTE — H&P (View-Only) (Signed)
Virtual Visit via Telephone Note   This visit type was conducted due to national recommendations for restrictions regarding the COVID-19 Pandemic (e.g. social distancing) in an effort to limit this patient's exposure and mitigate transmission in our community.  Due to his co-morbid illnesses, this patient is at least at moderate risk for complications without adequate follow up.  This format is felt to be most appropriate for this patient at this time.  The patient did not have access to video technology/had technical difficulties with video requiring transitioning to audio format only (telephone).  All issues noted in this document were discussed and addressed.  No physical exam could be performed with this format.  Please refer to the patient's chart for his  consent to telehealth for [facility].    Date:  02/18/2021   ID:  David Mound., DOB [birth year], MRN [id number] The patient was identified using 2 identifiers.  Patient Location: Home Provider Location: Office/Clinic   PCP:  Ollen Bowl, MD   [place]  Cardiologist:  Carlyle Dolly, MD  Advanced Practice Provider:  No care team member to display Electrophysiologist:  None   for APP is either Cardiology or Clinical Cardiac Electrophysiology  :[id number]   Evaluation Performed:  Follow-Up Visit  Chief Complaint:  Follow up  History of Present Illness:    David Outing. is a 55 y.o. male seen today for follow up of the following medical problems.This is a focused call to discuss recent echo findings  1.Chronic systolic HF - newly diagnosed in 2018, followed at [facility] previously - - echo 2018 LVEF 25-30% - 05/2019 echo LVEF 30-35% - did not have a cath, I presume due to borderline renal function at the time, had stress test. He has since started dialysis. 05/2017 Lexiscan Nuclear stressinferolateral scar with mild to moderate ischemia, inferolateral akinesia 05/2019 [facility]  lexiscan as reported below, essentially same inferolatearl findings - patient reports he declined AICD through [facility]  Restarted meds in January2021.    02/2020 echo LVEF 40-45%, grade III DDx. Mild RV dysfunction. 01/2021 echo LVEF 40-45%, mod RV dysfunction, severe pulm HTN PASPs 90s - no new symptoms  2. ESRD - onHD1.5 years. The patient does not have symptoms concerning for COVID-19 infection (fever, chills, cough, or new shortness of breath).    3. Pulmonary HTN - PASP by 01/2021 echo 99  Past Medical History:  Diagnosis Date  . ESRD (end stage renal disease) ([place])   . Hypertension   . Peripheral arterial disease ([place])   . Type 2 diabetes mellitus ([place])    Past Surgical History:  Procedure Laterality Date  . AV FISTULA PLACEMENT Right   . PARACENTESIS       No outpatient medications have been marked as taking for the 02/18/21 encounter (Appointment) with Arnoldo Lenis, MD.     Allergies:   Shellfish-derived products, Wasp venom protein, and Penicillins   Social History   Tobacco Use  . Smoking status: Never Smoker  . Smokeless tobacco: Never Used     Family Hx: The patient's family history includes Leukemia in his father; Thyroid cancer in his mother.  ROS:   Please see the history of present illness.     All other systems reviewed and are negative.   Prior CV studies:   The following studies were reviewed today:  05/2019 echo [facility] ECHOCARDIOGRAPHIC DESCRIPTIONS ----------------------------------------------- AORTIC ROOT    Size: Normal Dissection: INDETERM FOR DISSECTION  AORTIC VALVE  Leaflets: Tricuspid       Morphology: Normal  Mobility: Fully Mobile  LEFT VENTRICLE                   Anterior: HYPOCONTRACTILE    Size: Normal                 Lateral: HYPOCONTRACTILE Contraction: MOD GLOBAL DECREASE           Septal: HYPOCONTRACTILE Closest EF: 35%  (Estimated) Calc.EF: 38% (3D)   Apical: HYPOCONTRACTILE  LV masses: No Masses               Inferior: HYPOCONTRACTILE     LVH: None                 Posterior: HYPOCONTRACTILE LV GLS(AVG): -9.5% Normal Range [ <= -16] Dias.FxClass: RESTRICTIVE FILLING PATTERN (GRADE 3) CORRESPONDS TO REVERSIBLE       RESTRICTIVE PATTERN  MITRAL VALVE  Leaflets: Normal         Mobility: Fully mobile Morphology: Normal  LEFT ATRIUM    Size: MILDLY ENLARGED  LA masses: No masses       Normal IAS  MAIN PA    Size: MILDLY DILATED  PULMONIC VALVE Morphology: Normal  Mobility: Fully Mobile  RIGHT VENTRICLE    Size: MILDLY ENLARGED      Free wall: HYPOCONTRACTILE Contraction: MILD GLOBAL DECREASE   RV masses: No Masses    TAPSE:  1.6 cm, Normal Range [>= 1.6 cm]   RV Note: RV S'= 8 cm/s  TRICUSPID VALVE  Leaflets: Normal         Mobility: Fully mobile Morphology: Normal  RIGHT ATRIUM    Size: MODERATELY ENLARGED    RA Other: None  RA masses: No masses  PERICARDIUM    Fluid: No effusion Pleural Eff: PLEURAL EFFUSION NOTED  INFERIOR VENACAVA    Size: DILATED  ABNORMAL RESPIRATORY COLLAPSE  DOPPLER ECHO and OTHER SPECIAL PROCEDURES ------------------------------------  Aortic: No AR         No AS   Mitral: MILD MR        No MS  MV Inflow E Vel.= 95.0 cm/s MV Annulus E'Vel.= 7.0 cm/s E/E'Ratio= 14  Tricuspid: MODERATE TR      No TS      3.4 m/s peak TR vel  61 mmHg peak RV pressure  Pulmonary: TRIVIAL PR       No PS  MODERATE LV DYSFUNCTION (See above) ELEVATED LA PRESSURES WITH DIASTOLIC DYSFUNCTION MILD RV SYSTOLIC DYSFUNCTION (See above) VALVULAR REGURGITATION: MILD MR, TRIVIAL PR, MODERATE TR NO VALVULAR STENOSIS NO PRIOR STUDY FOR COMPARISON 3D acquisition and reconstructions  were performed as part of this examination to more accurately quantify the effects of reduced left ventricular ejection fraction.   05/2019 nuclear stress Image Analysis:   Rest Stress Conclusion Basal Anterior Normal Normal Normal Basal Ant Septal Normal Normal Normal Basal Inf Septal Normal Normal Normal Basal Inferior Normal Normal Normal Basal Inf Lat Mild Moderate Infrc/Isch Basal Ant Lat Normal Normal Normal Mid Anterior Normal Normal Normal Mid Ant Septal Normal Normal Normal Mid Inf Septal Normal Normal Normal Mid Inferior Normal Normal Normal Mid Inf Lateral Mild Moderate Infrc/Isch Mid Ant Lateral Normal Normal Normal Apical Anterior Normal Normal Normal Apical Septal Normal Normal Normal Apical Inferior Normal Normal Normal Apical Lateral Normal Normal Normal Apex Normal Normal Normal SPECT RESULTS Technical Quality: Good  PERFUSION: Abnormal rest and stress perfusion.   STRESS TEST Pharmacologic Protocol: Regadenoson Dose: 0.4  mg Stage Dose Time (min) BP (mmHg) HR (bpm) Comments  SUPINE 00:58 161 / 82 81  DOSE 1 0.4 01:00 82  RECOV 1 01:00 139 / 61 85 Feels drained  RECOV 2 01:00 85 Aminophylline 75 mg IV  RECOV 3 01:00 85  RECOV 4 01:40 156 / 87 83 Feels better   Resting HR (bpm): 81 Resting BP (mmHg): 161 / 82 MaxPHR: 167 Target HR (bpm): 142 Peak HR (bpm): 85 Peak BP (mmHg): 139 / 61 % MaxPHR: 51 Double Product: [id number] Response: Normal blood pressure response during stress Stress Termination: Planned Stress Symptoms: No CP reported.   ECG Negative ECG Interpretation: Heart rate is N/A  IMAGE PROTOCOL Rest/Stress 1 Day Modality: GE 530c Radiopharmaceutical Dose (mCi) Imaging Date Inj Time Img Time Rescan Reason Rescan Time Rest: Tc-93mTetrofosmin, 6.6 06/19/2019 1319 1400 IV Stress: Tc-982metrofosmin, 18 06/19/2019 1432 1515 IV Rest Administration Site: Left Forearm Tech  Administering Rest Dose: MaWenda LowCNMT Stress Administration Site: Left Forearm Tech Administering Stress Dose: MaWenda LowCNMT  FUNCTIONAL RESULTS (calculated via Gated SPECT) Post Stress Image LV EF: 36 % Stress EDV: 176 ml EDVI: 83 ml/m TID: Stress ESV: 112 ml ESVI: 53 ml/m Wall Motion Wall Thickening Anterior: Normal Normal Apex: Normal Normal Inferior: Severe - Hypokinetic Abnormal Lateral: Mild - Hypokinetic Normal Septal: Normal Normal  LV Ejection Fraction &Size: Reduced left ventricular ejection fraction.   LV Function &Wall Thickening: Severely reduced left ventricular systolic function.   02/2020 echo IMPRESSIONS    1. Left ventricular ejection fraction, by estimation, is 40 to 45%. The  left ventricle has mild to moderately decreased function. The left  ventricle demonstrates global hypokinesis. Left ventricular diastolic  parameters are consistent with Grade III  diastolic dysfunction (restrictive). Elevated left ventricular  end-diastolic pressure.  2. Right ventricular systolic function is mildly reduced. The right  ventricular size is mildly enlarged. There is moderately elevated  pulmonary artery systolic pressure.  3. Right atrial size was mildly dilated.  4. The mitral valve is grossly normal. Mild mitral valve regurgitation.  5. Tricuspid valve regurgitation is moderate.  6. The aortic valve is tricuspid. Aortic valve regurgitation is not  visualized. No aortic stenosis is present.  7. The inferior vena cava is dilated in size with <50% respiratory  variability, suggesting right atrial pressure of 15 mmHg.    01/2021 echo IMPRESSIONS    1. Left ventricular ejection fraction, by estimation, is 40 to 45%. The  left ventricle has mildly decreased function. The left ventricle  demonstrates regional wall motion abnormalities (see scoring  diagram/findings for description). Left ventricular  diastolic parameters are  indeterminate.  2. Right ventricular systolic function is moderately reduced. The right  ventricular size is mildly enlarged. There is severely elevated pulmonary  artery systolic pressure. The estimated right ventricular systolic  pressure is [id number].  3. Left atrial size was mildly dilated.  4. Right atrial size was mild to moderately dilated.  5. Left pleural effusion and ascites present.  6. The mitral valve is grossly normal. Mild mitral valve regurgitation.  7. Tricuspid valve regurgitation is moderate.  8. The aortic valve is tricuspid. Aortic valve regurgitation is not  visualized. Mild to moderate aortic valve sclerosis/calcification is  present, without any evidence of aortic stenosis. Aortic valve mean  gradient measures 4.0 mmHg.  9. The inferior vena cava is dilated in size with <50% respiratory  variability, suggesting right atrial pressure of 15 mmHg.    Labs/Other Tests  and Data Reviewed:    EKG:  No ECG reviewed.  Recent Labs: No results found for requested labs within last 8760 hours.   Recent Lipid Panel No results found for: CHOL, TRIG, HDL, CHOLHDL, LDLCALC, LDLDIRECT  Wt Readings from Last 3 Encounters:  12/30/20 178 lb 6.4 oz (80.9 kg)  09/22/20 184 lb 9.6 oz (83.7 kg)  06/08/20 188 lb (85.3 kg)     Risk Assessment/Calculations:      Objective:    Vital Signs:   Today's Vitals   02/18/21 1104  BP: 132/74  Weight: 178 lb 9.2 oz (81 kg)  Height: '6\' 2"'$  (1.88 m)   Body mass index is 22.93 kg/m. Normal affect. Normal speech pattern and tone. Comfortable, no apparent distres.s no audible signs of sob or wheezing.   ASSESSMENT & PLAN:    1.Chronic systolic HF - diagnosed in 2018 at [facility] - has not had a cath, I presume due to poor renal function at the time. He has since started HD - prior stress tests have showninferolateral infarct with mild to moderate ischemia. Would seem out of proportion to degree of his systolic  dysfunction.  - he stopped his medical therapy on his own, we have been reinitaiting  - we will d/c losartan, start entresto 24/'26mg'$  bid - we discussed cath again today given ongoign LV dysfunction as well as severe pulmonmary HTN noted on most recent echo, he is in agreement to pursue  2. Pulmonary HTN - severe by PASP by most recent echo, PASP in the 90s with some right sided dysfunction - plans for [facility] as mentioned above.   3. ESRD    Shared Decision Making/Informed Consent The risks [stroke (1 in 1000), death (1 in 1000), kidney failure [usually temporary] (1 in 500), bleeding (1 in 200), allergic reaction [possibly serious] (1 in 200)], benefits (diagnostic support and management of coronary artery disease) and alternatives of a cardiac catheterization were discussed in detail with David Rose and he is willing to proceed.        COVID-19 Education: The signs and symptoms of COVID-19 were discussed with the patient and how to seek care for testing (follow up with PCP or arrange E-visit).  The importance of social distancing was discussed today.  Time:   Today, I have spent 15 minutes with the patient with telehealth technology discussing the above problems.     Medication Adjustments/Labs and Tests Ordered: Current medicines are reviewed at length with the patient today.  Concerns regarding medicines are outlined above.   Tests Ordered: No orders of the defined types were placed in this encounter.   Medication Changes: No orders of the defined types were placed in this encounter.   Follow Up:  In Person in 6 week(s)  Signed, Carlyle Dolly, MD  02/18/2021 9:45 AM    [place]

## 2021-02-18 NOTE — Patient Instructions (Signed)
Your physician recommends that you schedule a follow-up appointment in: [place] has recommended you make the following change in your medication:   STOP LOSARTAN   START ENTRESTO 24/26 MG TWICE DAILY   Your physician has requested that you have a cardiac catheterization. Cardiac catheterization is used to diagnose and/or treat various heart conditions. Doctors may recommend this procedure for a number of different reasons. The most common reason is to evaluate chest pain. Chest pain can be a symptom of coronary artery disease (CAD), and cardiac catheterization can show whether plaque is narrowing or blocking your heart's arteries. This procedure is also used to evaluate the valves, as well as measure the blood flow and oxygen levels in different parts of your heart. For further information please visit [email or web address]. Please follow instruction sheet, as given.  Thank you for choosing [facility]!!

## 2021-02-18 NOTE — Progress Notes (Signed)
Virtual Visit via Telephone Note   This visit type was conducted due to national recommendations for restrictions regarding the COVID-19 Pandemic (e.g. social distancing) in an effort to limit this patient's exposure and mitigate transmission in our community.  Due to his co-morbid illnesses, this patient is at least at moderate risk for complications without adequate follow up.  This format is felt to be most appropriate for this patient at this time.  The patient did not have access to video technology/had technical difficulties with video requiring transitioning to audio format only (telephone).  All issues noted in this document were discussed and addressed.  No physical exam could be performed with this format.  Please refer to the patient's chart for his  consent to telehealth for [facility].    Date:  02/18/2021   ID:  David Mound., DOB [birth year], MRN [id number] The patient was identified using 2 identifiers.  Patient Location: Home Provider Location: Office/Clinic   PCP:  Ollen Bowl, MD   [place]  Cardiologist:  Carlyle Dolly, MD  Advanced Practice Provider:  No care team member to display Electrophysiologist:  None   for APP is either Cardiology or Clinical Cardiac Electrophysiology  :[id number]   Evaluation Performed:  Follow-Up Visit  Chief Complaint:  Follow up  History of Present Illness:    David Uphoff. is a 55 y.o. male seen today for follow up of the following medical problems.This is a focused call to discuss recent echo findings  1.Chronic systolic HF - newly diagnosed in 2018, followed at [facility] - - echo 2018 LVEF 25-30% - 05/2019 echo LVEF 30-35% - did not have a cath, I presume due to borderline renal function at the time, had stress test. He has since started dialysis. 05/2017 Lexiscan Nuclear stressinferolateral scar with mild to moderate ischemia, inferolateral akinesia 05/2019 [facility]  lexiscan as reported below, essentially same inferolatearl findings - patient reports he declined AICD through [facility].  Restarted meds in January2021.    02/2020 echo LVEF 40-45%, grade III DDx. Mild RV dysfunction. 01/2021 echo LVEF 40-45%, mod RV dysfunction, severe pulm HTN PASPs 90s - no new symptoms  2. ESRD - onHD1.5 years. The patient does not have symptoms concerning for COVID-19 infection (fever, chills, cough, or new shortness of breath).    3. Pulmonary HTN - PASP by 01/2021 echo 99  Past Medical History:  Diagnosis Date  . ESRD (end stage renal disease) ([place])   . Hypertension   . Peripheral arterial disease ([place])   . Type 2 diabetes mellitus ([place])    Past Surgical History:  Procedure Laterality Date  . AV FISTULA PLACEMENT Right   . PARACENTESIS       No outpatient medications have been marked as taking for the 02/18/21 encounter (Appointment) with Arnoldo Lenis, MD.     Allergies:   Shellfish-derived products, Wasp venom protein, and Penicillins   Social History   Tobacco Use  . Smoking status: Never Smoker  . Smokeless tobacco: Never Used     Family Hx: The patient's family history includes Leukemia in his father; Thyroid cancer in his mother.  ROS:   Please see the history of present illness.     All other systems reviewed and are negative.   Prior CV studies:   The following studies were reviewed today:  05/2019 echo [facility] ECHOCARDIOGRAPHIC DESCRIPTIONS ----------------------------------------------- AORTIC ROOT    Size: Normal Dissection: INDETERM FOR DISSECTION  AORTIC VALVE  Leaflets: Tricuspid       Morphology: Normal  Mobility: Fully Mobile  LEFT VENTRICLE                   Anterior: HYPOCONTRACTILE    Size: Normal                 Lateral: HYPOCONTRACTILE Contraction: MOD GLOBAL DECREASE           Septal: HYPOCONTRACTILE Closest EF: 35%  (Estimated) Calc.EF: 38% (3D)   Apical: HYPOCONTRACTILE  LV masses: No Masses               Inferior: HYPOCONTRACTILE     LVH: None                 Posterior: HYPOCONTRACTILE LV GLS(AVG): -9.5% Normal Range [ <= -16] Dias.FxClass: RESTRICTIVE FILLING PATTERN (GRADE 3) CORRESPONDS TO REVERSIBLE       RESTRICTIVE PATTERN  MITRAL VALVE  Leaflets: Normal         Mobility: Fully mobile Morphology: Normal  LEFT ATRIUM    Size: MILDLY ENLARGED  LA masses: No masses       Normal IAS  MAIN PA    Size: MILDLY DILATED  PULMONIC VALVE Morphology: Normal  Mobility: Fully Mobile  RIGHT VENTRICLE    Size: MILDLY ENLARGED      Free wall: HYPOCONTRACTILE Contraction: MILD GLOBAL DECREASE   RV masses: No Masses    TAPSE:  1.6 cm, Normal Range [>= 1.6 cm]   RV Note: RV S'= 8 cm/s  TRICUSPID VALVE  Leaflets: Normal         Mobility: Fully mobile Morphology: Normal  RIGHT ATRIUM    Size: MODERATELY ENLARGED    RA Other: None  RA masses: No masses  PERICARDIUM    Fluid: No effusion Pleural Eff: PLEURAL EFFUSION NOTED  INFERIOR VENACAVA    Size: DILATED  ABNORMAL RESPIRATORY COLLAPSE  DOPPLER ECHO and OTHER SPECIAL PROCEDURES ------------------------------------  Aortic: No AR         No AS   Mitral: MILD MR        No MS  MV Inflow E Vel.= 95.0 cm/s MV Annulus E'Vel.= 7.0 cm/s E/E'Ratio= 14  Tricuspid: MODERATE TR      No TS      3.4 m/s peak TR vel  61 mmHg peak RV pressure  Pulmonary: TRIVIAL PR       No PS  MODERATE LV DYSFUNCTION (See above) ELEVATED LA PRESSURES WITH DIASTOLIC DYSFUNCTION MILD RV SYSTOLIC DYSFUNCTION (See above) VALVULAR REGURGITATION: MILD MR, TRIVIAL PR, MODERATE TR NO VALVULAR STENOSIS NO PRIOR STUDY FOR COMPARISON 3D acquisition and reconstructions  were performed as part of this examination to more accurately quantify the effects of reduced left ventricular ejection fraction.   05/2019 nuclear stress Image Analysis:   Rest Stress Conclusion Basal Anterior Normal Normal Normal Basal Ant Septal Normal Normal Normal Basal Inf Septal Normal Normal Normal Basal Inferior Normal Normal Normal Basal Inf Lat Mild Moderate Infrc/Isch Basal Ant Lat Normal Normal Normal Mid Anterior Normal Normal Normal Mid Ant Septal Normal Normal Normal Mid Inf Septal Normal Normal Normal Mid Inferior Normal Normal Normal Mid Inf Lateral Mild Moderate Infrc/Isch Mid Ant Lateral Normal Normal Normal Apical Anterior Normal Normal Normal Apical Septal Normal Normal Normal Apical Inferior Normal Normal Normal Apical Lateral Normal Normal Normal Apex Normal Normal Normal SPECT RESULTS Technical Quality: Good  PERFUSION: Abnormal rest and stress perfusion.   STRESS TEST Pharmacologic Protocol: Regadenoson Dose: 0.4  mg Stage Dose Time (min) BP (mmHg) HR (bpm) Comments  SUPINE 00:58 161 / 82 81  DOSE 1 0.4 01:00 82  RECOV 1 01:00 139 / 61 85 Feels drained  RECOV 2 01:00 85 Aminophylline 75 mg IV  RECOV 3 01:00 85  RECOV 4 01:40 156 / 87 83 Feels better   Resting HR (bpm): 81 Resting BP (mmHg): 161 / 82 MaxPHR: 167 Target HR (bpm): 142 Peak HR (bpm): 85 Peak BP (mmHg): 139 / 61 % MaxPHR: 51 Double Product: [id number] BP Response: Normal blood pressure response during stress Stress Termination: Planned Stress Symptoms: No CP reported.   ECG Negative ECG Interpretation: Heart rate is N/A  IMAGE PROTOCOL Rest/Stress 1 Day Modality: GE 530c Radiopharmaceutical Dose (mCi) Imaging Date Inj Time Img Time Rescan Reason Rescan Time Rest: Tc-69mTetrofosmin, 6.6 06/19/2019 1319 1400 IV Stress: Tc-988metrofosmin, 18 06/19/2019 1432 1515 IV Rest Administration Site: Left Forearm Tech  Administering Rest Dose: MaWenda LowCNMT Stress Administration Site: Left Forearm Tech Administering Stress Dose: MaWenda LowCNMT  FUNCTIONAL RESULTS (calculated via Gated SPECT) Post Stress Image LV EF: 36 % Stress EDV: 176 ml EDVI: 83 ml/m TID: Stress ESV: 112 ml ESVI: 53 ml/m Wall Motion Wall Thickening Anterior: Normal Normal Apex: Normal Normal Inferior: Severe - Hypokinetic Abnormal Lateral: Mild - Hypokinetic Normal Septal: Normal Normal  LV Ejection Fraction &Size: Reduced left ventricular ejection fraction.   LV Function &Wall Thickening: Severely reduced left ventricular systolic function.   02/2020 echo IMPRESSIONS    1. Left ventricular ejection fraction, by estimation, is 40 to 45%. The  left ventricle has mild to moderately decreased function. The left  ventricle demonstrates global hypokinesis. Left ventricular diastolic  parameters are consistent with Grade III  diastolic dysfunction (restrictive). Elevated left ventricular  end-diastolic pressure.  2. Right ventricular systolic function is mildly reduced. The right  ventricular size is mildly enlarged. There is moderately elevated  pulmonary artery systolic pressure.  3. Right atrial size was mildly dilated.  4. The mitral valve is grossly normal. Mild mitral valve regurgitation.  5. Tricuspid valve regurgitation is moderate.  6. The aortic valve is tricuspid. Aortic valve regurgitation is not  visualized. No aortic stenosis is present.  7. The inferior vena cava is dilated in size with <50% respiratory  variability, suggesting right atrial pressure of 15 mmHg.    01/2021 echo IMPRESSIONS    1. Left ventricular ejection fraction, by estimation, is 40 to 45%. The  left ventricle has mildly decreased function. The left ventricle  demonstrates regional wall motion abnormalities (see scoring  diagram/findings for description). Left ventricular  diastolic parameters are  indeterminate.  2. Right ventricular systolic function is moderately reduced. The right  ventricular size is mildly enlarged. There is severely elevated pulmonary  artery systolic pressure. The estimated right ventricular systolic  pressure is [id number].  3. Left atrial size was mildly dilated.  4. Right atrial size was mild to moderately dilated.  5. Left pleural effusion and ascites present.  6. The mitral valve is grossly normal. Mild mitral valve regurgitation.  7. Tricuspid valve regurgitation is moderate.  8. The aortic valve is tricuspid. Aortic valve regurgitation is not  visualized. Mild to moderate aortic valve sclerosis/calcification is  present, without any evidence of aortic stenosis. Aortic valve mean  gradient measures 4.0 mmHg.  9. The inferior vena cava is dilated in size with <50% respiratory  variability, suggesting right atrial pressure of 15 mmHg.    Labs/Other Tests  and Data Reviewed:    EKG:  No ECG reviewed.  Recent Labs: No results found for requested labs within last 8760 hours.   Recent Lipid Panel No results found for: CHOL, TRIG, HDL, CHOLHDL, LDLCALC, LDLDIRECT  Wt Readings from Last 3 Encounters:  12/30/20 178 lb 6.4 oz (80.9 kg)  09/22/20 184 lb 9.6 oz (83.7 kg)  06/08/20 188 lb (85.3 kg)     Risk Assessment/Calculations:      Objective:    Vital Signs:   Today's Vitals   02/18/21 1104  BP: 132/74  Weight: 178 lb 9.2 oz (81 kg)  Height: '6\' 2"'$  (1.88 m)   Body mass index is 22.93 kg/m. Normal affect. Normal speech pattern and tone. Comfortable, no apparent distres.s no audible signs of sob or wheezing.   ASSESSMENT & PLAN:    1.Chronic systolic HF - diagnosed in 2018 at [facility] - has not had a cath, I presume due to poor renal function at the time. He has since started HD - prior stress tests have showninferolateral infarct with mild to moderate ischemia. Would seem out of proportion to degree of his systolic  dysfunction.  - he stopped his medical therapy on his own, we have been reinitaiting  - we will d/c losartan, start entresto 24/'26mg'$  bid - we discussed cath again today given ongoign LV dysfunction as well as severe pulmonmary HTN noted on most recent echo, he is in agreement to pursue  2. Pulmonary HTN - severe by PASP by most recent echo, PASP in the 90s with some right sided dysfunction - plans for [facility] as mentioned above.   3. ESRD    Shared Decision Making/Informed Consent The risks [stroke (1 in 1000), death (1 in 1000), kidney failure [usually temporary] (1 in 500), bleeding (1 in 200), allergic reaction [possibly serious] (1 in 200)], benefits (diagnostic support and management of coronary artery disease) and alternatives of a cardiac catheterization were discussed in detail with David Rose and he is willing to proceed.        COVID-19 Education: The signs and symptoms of COVID-19 were discussed with the patient and how to seek care for testing (follow up with PCP or arrange E-visit).  The importance of social distancing was discussed today.  Time:   Today, I have spent 15 minutes with the patient with telehealth technology discussing the above problems.     Medication Adjustments/Labs and Tests Ordered: Current medicines are reviewed at length with the patient today.  Concerns regarding medicines are outlined above.   Tests Ordered: No orders of the defined types were placed in this encounter.   Medication Changes: No orders of the defined types were placed in this encounter.   Follow Up:  In Person in 6 week(s)  Signed, Carlyle Dolly, MD  02/18/2021 9:45 AM    [place]

## 2021-02-18 NOTE — Addendum Note (Signed)
Addended by: Julian Hy T on: 02/18/2021 02:46 PM   Modules accepted: Orders

## 2021-02-19 ENCOUNTER — Other Ambulatory Visit: Payer: Self-pay | Admitting: Cardiology

## 2021-02-19 DIAGNOSIS — I5022 Chronic systolic (congestive) heart failure: Secondary | ICD-10-CM

## 2021-02-19 MED ORDER — SODIUM CHLORIDE 0.9% FLUSH: 3.0000 mL | Freq: Two times a day (BID) | INTRAVENOUS | Status: AC

## 2021-02-22 ENCOUNTER — Other Ambulatory Visit: Payer: Self-pay | Admitting: *Deleted

## 2021-02-22 ENCOUNTER — Telehealth: Payer: Self-pay | Admitting: *Deleted

## 2021-02-22 ENCOUNTER — Other Ambulatory Visit (HOSPITAL_COMMUNITY)
Admission: RE | Admit: 2021-02-22 | Discharge: 2021-02-22 | Disposition: A | Discharge status: Home / Self Care | Payer: Medicare Other | Source: Ambulatory Visit | Attending: Cardiovascular Disease | Admitting: Cardiovascular Disease

## 2021-02-22 ENCOUNTER — Other Ambulatory Visit (HOSPITAL_COMMUNITY)
Admission: RE | Admit: 2021-02-22 | Discharge: 2021-02-22 | Disposition: A | Discharge status: Home / Self Care | Payer: Medicare Other | Source: Ambulatory Visit | Attending: Cardiology | Admitting: Cardiology

## 2021-02-22 ENCOUNTER — Other Ambulatory Visit: Payer: Self-pay

## 2021-02-22 DIAGNOSIS — Z20822 Contact with and (suspected) exposure to covid-19: Secondary | ICD-10-CM | POA: Diagnosis not present

## 2021-02-22 DIAGNOSIS — I5022 Chronic systolic (congestive) heart failure: Secondary | ICD-10-CM

## 2021-02-22 DIAGNOSIS — Z01812 Encounter for preprocedural laboratory examination: Secondary | ICD-10-CM | POA: Insufficient documentation

## 2021-02-22 LAB — CBC
HCT: 32.3 % — ABNORMAL LOW (ref 39.0–52.0)
Hemoglobin: 10.1 g/dL — ABNORMAL LOW (ref 13.0–17.0)
MCH: 32.3 pg (ref 26.0–34.0)
MCHC: 31.3 g/dL (ref 30.0–36.0)
MCV: 103.2 fL — ABNORMAL HIGH (ref 80.0–100.0)
Platelets: 157 10*3/uL (ref 150–400)
RBC: 3.13 MIL/uL — ABNORMAL LOW (ref 4.22–5.81)
RDW: 12.6 % (ref 11.5–15.5)
WBC: 6 10*3/uL (ref 4.0–10.5)
nRBC: 0.3 % — ABNORMAL HIGH (ref 0.0–0.2)

## 2021-02-22 LAB — BASIC METABOLIC PANEL
Anion gap: 14 (ref 5–15)
BUN: 52 mg/dL — ABNORMAL HIGH (ref 6–20)
CO2: 21 mmol/L — ABNORMAL LOW (ref 22–32)
Calcium: 7.9 mg/dL — ABNORMAL LOW (ref 8.9–10.3)
Chloride: 100 mmol/L (ref 98–111)
Creatinine, Ser: 7.01 mg/dL — ABNORMAL HIGH (ref 0.61–1.24)
GFR, Estimated: 9 mL/min — ABNORMAL LOW (ref >60)
Glucose, Bld: 118 mg/dL — ABNORMAL HIGH (ref 70–99)
Potassium: 4.4 mmol/L (ref 3.5–5.1)
Sodium: 135 mmol/L (ref 135–145)

## 2021-02-22 LAB — SARS CORONAVIRUS 2 (TAT 6-24 HRS): SARS Coronavirus 2: NEGATIVE

## 2021-02-22 NOTE — Telephone Encounter (Signed)
Pt contacted pre-catheterization scheduled at [facility] for: Wednesday Feb 24, 2021 8:30 AM Verified arrival time and place: [facility]) at: 6:30 AM Dialysis Tu-Th-Sat  No solid food after midnight prior to cath, clear liquids until 5 AM day of procedure  Hold: Lasix-AM of procedure  Except hold medications AM meds can be  taken pre-cath with sips of water including: ASA 81 mg   Confirmed patient has responsible adult to drive home post procedure and be with patient first 24 hours after arriving home: yes  You are allowed ONE visitor in the waiting room during the time you are at the hospital for your procedure. Both you and your visitor must wear a mask once you enter the hospital.    Reviewed procedure/mask/visitor instructions with patient.  Patient Tu-Th-Sat dialysis schedule.

## 2021-02-22 NOTE — Telephone Encounter (Addendum)
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  Patient does not recall allergic reaction to IV contrast in the past, including rash, itching, shortness of breath. He did say when he had angiogram of his hand at [facility] in January 2022, he was given Benadryl just prior to procedure, he is not sure if he was also given prednisone.   I will forward to Dr Harl Bowie for review and recommendations about contrast allergy prophylaxis (13 hour Prednisone and Benadryl Prep) prior to [facility] 02/24/21.

## 2021-02-22 NOTE — Progress Notes (Signed)
mp

## 2021-02-23 NOTE — Telephone Encounter (Signed)
Per Dr Harl Bowie- does not recommend pre-medication for IV contrast allergy-states the new contrast dyes are not related to any shell fish derivative.   Call placed to patient to let him know Dr Harl Bowie did not recommend pre-medication for IV contrast allergy and confirmed that patient reports allergy to seafood and does not recall allergic reaction to IV contrast.

## 2021-02-24 ENCOUNTER — Ambulatory Visit (HOSPITAL_COMMUNITY)
Admission: RE | Admit: 2021-02-24 | Discharge: 2021-02-24 | Disposition: A | Discharge status: Home / Self Care | Payer: Medicare Other | Attending: Cardiovascular Disease | Admitting: Cardiovascular Disease

## 2021-02-24 ENCOUNTER — Encounter (HOSPITAL_COMMUNITY): Payer: Self-pay | Admitting: Cardiovascular Disease

## 2021-02-24 ENCOUNTER — Encounter (HOSPITAL_COMMUNITY)
Admission: RE | Disposition: A | Discharge status: Home / Self Care | Payer: Self-pay | Source: Home / Self Care | Attending: Cardiovascular Disease

## 2021-02-24 ENCOUNTER — Other Ambulatory Visit: Payer: Self-pay

## 2021-02-24 DIAGNOSIS — Z91013 Allergy to seafood: Secondary | ICD-10-CM | POA: Insufficient documentation

## 2021-02-24 DIAGNOSIS — I2510 Atherosclerotic heart disease of native coronary artery without angina pectoris: Secondary | ICD-10-CM | POA: Diagnosis not present

## 2021-02-24 DIAGNOSIS — Z992 Dependence on renal dialysis: Secondary | ICD-10-CM | POA: Insufficient documentation

## 2021-02-24 DIAGNOSIS — N186 End stage renal disease: Secondary | ICD-10-CM | POA: Insufficient documentation

## 2021-02-24 DIAGNOSIS — I132 Hypertensive heart and chronic kidney disease with heart failure and with stage 5 chronic kidney disease, or end stage renal disease: Secondary | ICD-10-CM | POA: Diagnosis present

## 2021-02-24 DIAGNOSIS — I5022 Chronic systolic (congestive) heart failure: Secondary | ICD-10-CM

## 2021-02-24 DIAGNOSIS — I5042 Chronic combined systolic (congestive) and diastolic (congestive) heart failure: Secondary | ICD-10-CM | POA: Diagnosis not present

## 2021-02-24 DIAGNOSIS — I272 Pulmonary hypertension, unspecified: Secondary | ICD-10-CM | POA: Insufficient documentation

## 2021-02-24 DIAGNOSIS — Z880 Allergy status to penicillin: Secondary | ICD-10-CM | POA: Insufficient documentation

## 2021-02-24 DIAGNOSIS — E1122 Type 2 diabetes mellitus with diabetic chronic kidney disease: Secondary | ICD-10-CM | POA: Insufficient documentation

## 2021-02-24 HISTORY — PX: RIGHT/LEFT HEART CATH AND CORONARY ANGIOGRAPHY: CATH118266

## 2021-02-24 LAB — POCT I-STAT EG7
Acid-base deficit: 6 mmol/L — ABNORMAL HIGH (ref 0.0–2.0)
Acid-base deficit: 6 mmol/L — ABNORMAL HIGH (ref 0.0–2.0)
Bicarbonate: 20.2 mmol/L (ref 20.0–28.0)
Bicarbonate: 20.8 mmol/L (ref 20.0–28.0)
Calcium, Ion: 1.12 mmol/L — ABNORMAL LOW (ref 1.15–1.40)
Calcium, Ion: 1.13 mmol/L — ABNORMAL LOW (ref 1.15–1.40)
HCT: 32 % — ABNORMAL LOW (ref 39.0–52.0)
HCT: 32 % — ABNORMAL LOW (ref 39.0–52.0)
Hemoglobin: 10.9 g/dL — ABNORMAL LOW (ref 13.0–17.0)
Hemoglobin: 10.9 g/dL — ABNORMAL LOW (ref 13.0–17.0)
O2 Saturation: 79 %
O2 Saturation: 80 %
Potassium: 4.7 mmol/L (ref 3.5–5.1)
Potassium: 4.7 mmol/L (ref 3.5–5.1)
Sodium: 138 mmol/L (ref 135–145)
Sodium: 138 mmol/L (ref 135–145)
TCO2: 21 mmol/L — ABNORMAL LOW (ref 22–32)
TCO2: 22 mmol/L (ref 22–32)
pCO2, Ven: 41.9 mmHg — ABNORMAL LOW (ref 44.0–60.0)
pCO2, Ven: 43.2 mmHg — ABNORMAL LOW (ref 44.0–60.0)
pH, Ven: 7.291 (ref 7.250–7.430)
pH, Ven: 7.291 (ref 7.250–7.430)
pO2, Ven: 48 mmHg — ABNORMAL HIGH (ref 32.0–45.0)
pO2, Ven: 49 mmHg — ABNORMAL HIGH (ref 32.0–45.0)

## 2021-02-24 LAB — POCT I-STAT 7, (LYTES, BLD GAS, ICA,H+H)
Acid-base deficit: 5 mmol/L — ABNORMAL HIGH (ref 0.0–2.0)
Bicarbonate: 20.3 mmol/L (ref 20.0–28.0)
Calcium, Ion: 1.1 mmol/L — ABNORMAL LOW (ref 1.15–1.40)
HCT: 31 % — ABNORMAL LOW (ref 39.0–52.0)
Hemoglobin: 10.5 g/dL — ABNORMAL LOW (ref 13.0–17.0)
O2 Saturation: 99 %
Potassium: 4.7 mmol/L (ref 3.5–5.1)
Sodium: 137 mmol/L (ref 135–145)
TCO2: 21 mmol/L — ABNORMAL LOW (ref 22–32)
pCO2 arterial: 37.9 mmHg (ref 32.0–48.0)
pH, Arterial: 7.336 — ABNORMAL LOW (ref 7.350–7.450)
pO2, Arterial: 173 mmHg — ABNORMAL HIGH (ref 83.0–108.0)

## 2021-02-24 LAB — GLUCOSE, CAPILLARY: Glucose-Capillary: 119 mg/dL — ABNORMAL HIGH (ref 70–99)

## 2021-02-24 SURGERY — RIGHT/LEFT HEART CATH AND CORONARY ANGIOGRAPHY
Anesthesia: LOCAL

## 2021-02-24 MED ORDER — SODIUM CHLORIDE 0.9% FLUSH: 3.0000 mL | INTRAVENOUS | Status: DC | PRN

## 2021-02-24 MED ORDER — SODIUM CHLORIDE 0.9 % IV SOLN: 250.0000 mL | INTRAVENOUS | Status: DC | PRN

## 2021-02-24 MED ORDER — ASPIRIN 81 MG PO CHEW
CHEWABLE_TABLET | ORAL | Status: AC
  Administered 2021-02-24: 81 mg via ORAL
  Filled 2021-02-24: qty 1

## 2021-02-24 MED ORDER — SODIUM CHLORIDE 0.9% FLUSH
3.0000 mL | INTRAVENOUS | Status: DC | PRN
Start: 2021-02-24 — End: 2021-02-24

## 2021-02-24 MED ORDER — ASPIRIN 81 MG PO CHEW: 81.0000 mg | CHEWABLE_TABLET | Freq: Every day | ORAL | Status: DC

## 2021-02-24 MED ORDER — NITROGLYCERIN 0.4 MG SL SUBL: 0.4000 mg | SUBLINGUAL_TABLET | SUBLINGUAL | 2 refills | Status: AC | PRN

## 2021-02-24 MED ORDER — IOHEXOL 350 MG/ML SOLN
INTRAVENOUS | Status: DC | PRN
  Administered 2021-02-24: 65 mL

## 2021-02-24 MED ORDER — FENTANYL CITRATE (PF) 100 MCG/2ML IJ SOLN
INTRAMUSCULAR | Status: AC
  Filled 2021-02-24: qty 2

## 2021-02-24 MED ORDER — LIDOCAINE HCL (PF) 1 % IJ SOLN
INTRAMUSCULAR | Status: AC
  Filled 2021-02-24: qty 30

## 2021-02-24 MED ORDER — HEPARIN (PORCINE) IN NACL 1000-0.9 UT/500ML-% IV SOLN
INTRAVENOUS | Status: DC | PRN
  Administered 2021-02-24 (×2): 500 mL

## 2021-02-24 MED ORDER — SODIUM CHLORIDE 0.9% FLUSH: 3.0000 mL | Freq: Two times a day (BID) | INTRAVENOUS | Status: DC

## 2021-02-24 MED ORDER — HEPARIN (PORCINE) IN NACL 1000-0.9 UT/500ML-% IV SOLN
INTRAVENOUS | Status: AC
  Filled 2021-02-24: qty 1000

## 2021-02-24 MED ORDER — MIDAZOLAM HCL 2 MG/2ML IJ SOLN
INTRAMUSCULAR | Status: DC | PRN
  Administered 2021-02-24: 2 mg via INTRAVENOUS
  Administered 2021-02-24: 1 mg via INTRAVENOUS

## 2021-02-24 MED ORDER — HEPARIN (PORCINE) IN NACL 1000-0.9 UT/500ML-% IV SOLN
INTRAVENOUS | Status: AC
  Filled 2021-02-24: qty 500

## 2021-02-24 MED ORDER — LIDOCAINE HCL (PF) 1 % IJ SOLN
INTRAMUSCULAR | Status: DC | PRN
  Administered 2021-02-24: 20 mL via INTRADERMAL

## 2021-02-24 MED ORDER — DIAZEPAM 5 MG PO TABS: 5.0000 mg | ORAL_TABLET | Freq: Four times a day (QID) | ORAL | Status: DC | PRN

## 2021-02-24 MED ORDER — ACETAMINOPHEN 325 MG PO TABS: 650.0000 mg | ORAL_TABLET | ORAL | Status: DC | PRN

## 2021-02-24 MED ORDER — SODIUM CHLORIDE 0.9 % IV SOLN: INTRAVENOUS | Status: DC

## 2021-02-24 MED ORDER — HEPARIN SODIUM (PORCINE) 1000 UNIT/ML IJ SOLN
INTRAMUSCULAR | Status: AC
  Filled 2021-02-24: qty 1

## 2021-02-24 MED ORDER — MIDAZOLAM HCL 2 MG/2ML IJ SOLN
INTRAMUSCULAR | Status: AC
  Filled 2021-02-24: qty 2

## 2021-02-24 MED ORDER — FENTANYL CITRATE (PF) 100 MCG/2ML IJ SOLN
INTRAMUSCULAR | Status: DC | PRN
  Administered 2021-02-24 (×2): 25 ug via INTRAVENOUS

## 2021-02-24 MED ORDER — HYDRALAZINE HCL 20 MG/ML IJ SOLN: 10.0000 mg | INTRAMUSCULAR | Status: DC | PRN

## 2021-02-24 MED ORDER — ASPIRIN 81 MG PO CHEW: 81.0000 mg | CHEWABLE_TABLET | ORAL | Status: AC

## 2021-02-24 MED ORDER — LABETALOL HCL 5 MG/ML IV SOLN: 10.0000 mg | INTRAVENOUS | Status: DC | PRN

## 2021-02-24 MED ORDER — ONDANSETRON HCL 4 MG/2ML IJ SOLN: 4.0000 mg | Freq: Four times a day (QID) | INTRAMUSCULAR | Status: DC | PRN

## 2021-02-24 SURGICAL SUPPLY — 10 items
CATH INFINITI 5FR MULTPACK ANG (CATHETERS) ×2 IMPLANT
CATH SWAN GANZ 7F STRAIGHT (CATHETERS) ×2 IMPLANT
KIT HEART LEFT (KITS) ×2 IMPLANT
PACK CARDIAC CATHETERIZATION (CUSTOM PROCEDURE TRAY) ×2 IMPLANT
SHEATH PINNACLE 5F 10CM (SHEATH) ×2 IMPLANT
SHEATH PINNACLE 7F 10CM (SHEATH) ×2 IMPLANT
TRANSDUCER W/STOPCOCK (MISCELLANEOUS) ×2 IMPLANT
TUBING CIL FLEX 10 FLL-RA (TUBING) ×2 IMPLANT
WIRE EMERALD 3MM-J .025X260CM (WIRE) ×2 IMPLANT
WIRE EMERALD 3MM-J .035X150CM (WIRE) ×2 IMPLANT

## 2021-02-24 NOTE — Discharge Instructions (Signed)
Angiogram, Care After This sheet gives you information about how to care for yourself after your procedure. Your health care provider may also give you more specific instructions. If you have problems or questions, contact your health care provider. What can I expect after the procedure? After the procedure, it is common to have:  Bruising and tenderness at the catheter insertion area.  A collection of blood (hematoma) at the insertion area. This may feel like a small lump under the skin at the insertion site. Follow these instructions at home: Insertion site care  Follow instructions from your health care provider about how to take care of your insertion site. Make sure you: ? Wash your hands with soap and water before and after you change your bandage (dressing). If soap and water are not available, use hand sanitizer. ? Change your dressing as told by your health care provider.  Do not take baths, swim, or use a hot tub until your health care provider approves.  You may shower 24-48 hours after the procedure, or as told by your health care provider. To clean the insertion site: ? Gently wash the area with plain soap and water. ? Pat the area dry with a clean towel. ? Do not rub the site. This may cause bleeding.  Check your insertion site every day for signs of infection. Check for: ? Redness, swelling, or pain. ? Fluid or blood. ? Warmth. ? Pus or a bad smell.  Do not apply powder or lotion to the site. Keep the site clean and dry.   Activity  Do not drive for 24 hours if you were given a sedative during your procedure.  Rest as told by your health care provider, usually for 1-2 days.  Do not lift anything that is heavier than 10 lb (4.5 kg), or the limit that you are told, until your health care provider says that it is safe.  If the insertion site was in your leg, try to avoid stairs for a few days.  Return to your normal activities as told by your health care provider,  usually in about a week. Ask your health care provider what activities are safe for you. General instructions  If your insertion site starts bleeding, lie flat and put pressure on the site. If the bleeding does not stop, get help right away. This is a medical emergency.  Take over-the-counter and prescription medicines only as told by your health care provider.  Drink enough fluid to keep your urine pale yellow. This helps flush the contrast dye from your body.  Keep all follow-up visits as told by your health care provider. This is important.   Contact a health care provider if:  You have a fever or chills.  You have redness, swelling, or pain around your insertion site.  You have fluid or blood coming from your insertion site.  Your insertion site feels warm to the touch.  You have pus or a bad smell coming from your insertion site.  You have more bruising around the insertion site. Get help right away if you have:  A problem with the insertion area, such as: ? The area swells fast or bleeds even after you apply pressure. ? The area becomes pale, cool, tingly, or numb.  Chest pain.  Trouble breathing.  A rash.  Any symptoms of a stroke. "BE FAST" is an easy way to remember the main warning signs of a stroke: ? B - Balance. Signs are dizziness, sudden trouble walking,   or loss of balance. ? E - Eyes. Signs are trouble seeing or a sudden change in vision. ? F - Face. Signs are sudden weakness or loss of feeling of the face, or the face or eyelid drooping on one side. ? A - Arms. Signs are weakness or loss of feeling in an arm. This happens suddenly and usually on one side of the body. ? S - Speech. Signs are sudden trouble speaking, slurred speech, or trouble understanding what people say. ? T - Time. Time to call emergency services. Write down what time symptoms started.  You have other signs of a stroke, such as: ? A sudden, severe headache with no known cause. ? Nausea  or vomiting. ? Seizure. These symptoms may represent a serious problem that is an emergency. Do not wait to see if the symptoms will go away. Get medical help right away. Call your local emergency services (911 in the [place] Do not drive yourself to the hospital. Summary  It is common to have bruising and tenderness at the catheter insertion area.  Do not take baths, swim, or use a hot tub until your health care provider approves. You may shower 24-48 hours after the procedure or as told.  It is important to rest and drink plenty of fluids.  If the insertion site bleeds, lie flat and put pressure on the site. If the bleeding continues, get help right away. This is a medical emergency. This information is not intended to replace advice given to you by your health care provider. Make sure you discuss any questions you have with your health care provider. Document Revised: 08/14/2019 Document Reviewed: 08/14/2019 Elsevier Patient Education  2021 Elsevier Inc.  

## 2021-02-24 NOTE — Progress Notes (Signed)
TCTS consulted for outpatient CABG evaluation. TCTS office will call patient with an appointment.

## 2021-02-24 NOTE — Research (Signed)
Identify Phdev Informed Consent   Subject Name: David Rose.  Subject met inclusion and exclusion criteria.  The informed consent form, study requirements and expectations were reviewed with the subject and questions and concerns were addressed prior to the signing of the consent form.  The subject verbalized understanding of the trail requirements.  The subject agreed to participate in the Identify Phdev trial and signed the informed consent.  The informed consent was obtained prior to performance of any protocol-specific procedures for the subject.  A copy of the signed informed consent was given to the subject and a copy was placed in the subject's medical record.  [name] D 02/24/2021, 0745 am

## 2021-02-24 NOTE — Progress Notes (Signed)
Site area: rt groin arterial and venous sheaths Site Prior to Removal:  Level 0 Pressure Applied For: 20 minutes Manual:   yes Patient Status During Pull:  stable Post Pull Site:  Level 0 Post Pull Instructions Given:  yes Post Pull Pulses Present: rt dp dopplered Dressing Applied:  Gauze and tegaderm Bedrest begins @ 1005 Comments:

## 2021-02-24 NOTE — Interval H&P Note (Signed)
Cath Lab Visit (complete for each Cath Lab visit)  Clinical Evaluation Leading to the Procedure:   ACS: No.  Non-ACS:    Anginal Classification: CCS II  Anti-ischemic medical therapy: Minimal Therapy (1 class of medications)  Non-Invasive Test Results: No non-invasive testing performed  Prior CABG: No previous CABG      History and Physical Interval Note:  02/24/2021 8:35 AM  David Rose.  has presented today for surgery, with the diagnosis of heart failure.  The various methods of treatment have been discussed with the patient and family. After consideration of risks, benefits and other options for treatment, the patient has consented to  Procedure(s): RIGHT/LEFT HEART CATH AND CORONARY ANGIOGRAPHY (N/A) as a surgical intervention.  The patient's history has been reviewed, patient examined, no change in status, stable for surgery.  I have reviewed the patient's chart and labs.  Questions were answered to the patient's satisfaction.     Shelva Majestic

## 2021-02-26 ENCOUNTER — Encounter: Payer: Medicare Other | Admitting: Thoracic Surgery (Cardiothoracic Vascular Surgery)

## 2021-03-01 ENCOUNTER — Encounter: Payer: Self-pay | Admitting: Cardiothoracic Surgery

## 2021-03-01 ENCOUNTER — Institutional Professional Consult (permissible substitution) (INDEPENDENT_AMBULATORY_CARE_PROVIDER_SITE_OTHER): Payer: Medicare Other | Admitting: Cardiothoracic Surgery

## 2021-03-01 ENCOUNTER — Other Ambulatory Visit: Payer: Self-pay

## 2021-03-01 VITALS — BP 121/66 | HR 77 | Resp 20 | Ht 74.0 in | Wt 183.0 lb

## 2021-03-01 DIAGNOSIS — I2510 Atherosclerotic heart disease of native coronary artery without angina pectoris: Secondary | ICD-10-CM | POA: Diagnosis not present

## 2021-03-01 NOTE — Progress Notes (Signed)
[address]       [place] [address]             [facility] REPORT  Referring Provider is Troy Sine, MD Primary Cardiologist is Carlyle Dolly, MD PCP is Margo Common Jarvis Newcomer, MD  Chief Complaint  Patient presents with  . Coronary Artery Disease    Initial surgical consult, Cath 5/4, ECHO 4/6    HPI:  55 year old man presents for surgical consultation related to recently diagnosed multivessel coronary artery disease.  He has a history of end-stage renal disease requiring dialysis for at least the last couple of years.  In addition he has a diagnosis of heart failure dating back to 2018.  This was recently assessed with a echocardiogram which demonstrated reduced LV function and RV function.  He underwent left heart catheterization to assess.  This demonstrated multivessel coronary disease.  He is referred for CABG.  He states that he dialyzes 3 times per week and has no difficulty with chest pain or shortness of breath during dialysis.  He does have occasional lower extremity edema and as he still makes urine he does take Lasix when this occurs.  He denies any classic angina.  Past Medical History:  Diagnosis Date  . ESRD (end stage renal disease) ([place])   . Hypertension   . Peripheral arterial disease ([place])   . Type 2 diabetes mellitus ([place])     Past Surgical History:  Procedure Laterality Date  . AV FISTULA PLACEMENT Right   . PARACENTESIS    . RIGHT/LEFT HEART CATH AND CORONARY ANGIOGRAPHY N/A 02/24/2021   Procedure: RIGHT/LEFT HEART CATH AND CORONARY ANGIOGRAPHY;  Surgeon: Troy Sine, MD;  Location: [facility];  Service: Cardiovascular;  Laterality: N/A;    Family History  Problem Relation Age of Onset  . Thyroid cancer Mother   . Leukemia Father     Social History   Socioeconomic History  . Marital status: Divorced    Spouse name: Not on file  . Number of children: Not on file  .  Years of education: Not on file  . Highest education level: Not on file  Occupational History  . Not on file  Tobacco Use  . Smoking status: Never Smoker  . Smokeless tobacco: Never Used  Substance and Sexual Activity  . Alcohol use: Not on file  . Drug use: Not on file  . Sexual activity: Not on file  Other Topics Concern  . Not on file  Social History Narrative   Pt has 1 year of college.    Social Determinants of Health   Financial Resource Strain: Not on file  Food Insecurity: Not on file  Transportation Needs: Not on file  Physical Activity: Not on file  Stress: Not on file  Social Connections: Not on file  Intimate Partner Violence: Not on file    Current Outpatient Medications  Medication Sig Dispense Refill  . Eluxadoline (VIBERZI) 75 MG TABS Take 75 mg by mouth in the morning and at bedtime.    . furosemide (LASIX) 80 MG tablet Take 80 mg by mouth daily as needed for fluid.    Marland Kitchen lidocaine-prilocaine (EMLA) cream Apply 1 application topically as needed (before Dialysys).    . metoprolol succinate (TOPROL-XL) 50 MG 24 hr tablet Take 50 mg by mouth daily. Take with or immediately following a meal.    . nitroGLYCERIN (NITROSTAT) 0.4 MG SL  tablet Place 1 tablet (0.4 mg total) under the tongue every 5 (five) minutes as needed. 25 tablet 2  . sacubitril-valsartan (ENTRESTO) 24-26 MG Take 1 tablet by mouth 2 (two) times daily. 60 tablet 3  . sevelamer carbonate (RENVELA) 800 MG tablet Take 800 mg by mouth 3 (three) times daily with meals.     Current Facility-Administered Medications  Medication Dose Route Frequency Provider Last Rate Last Admin  . sodium chloride flush (NS) 0.9 % injection 3 mL  3 mL Intravenous Q12H Branch, Alphonse Guild, MD        Allergies  Allergen Reactions  . Shellfish-Derived Products Anaphylaxis  . Wasp Venom Protein Anaphylaxis  . 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  Neuro:   Denies history of strokes or seizures  Musculoskeletal: Negative  Skin:   Positive for pressure sores or ulcerations  Psych:   Negative  Eyes:   Negative  ENT:   Negative  Hematologic:  Endorses easy bruising,  Endocrine:  diabetes,      Physical Exam:   BP 121/66 (BP Location: Left Arm, Patient Position: Sitting)   Pulse 77   Resp 20   Ht '6\' 2"'$  (1.88 m)   Wt 83 kg   SpO2 98% Comment: RA  BMI 23.50 kg/m   General:  Chronically ill-appearing, nad  HEENT:  Unremarkable   Neck:   no JVD, no bruits, no adenopathy  Chest:   clear to auscultation, symmetrical breath sounds, no wheezes, no rhonchi   CV:   RRR, no murmur   Abdomen:  soft, non-tender, no masses; positive distention consistent with ascites extremities:  warm, well-perfused, pulses diminished throughout, no LE edema  Rectal/GU  Deferred  Neuro:   Grossly non-focal and symmetrical throughout  Skin:   Clean and dry, no rashes, no breakdown   Diagnostic Tests:  I have personally reviewed available imaging studies including left heart catheterization from 02/24/2021 which demonstrates severe multivessel coronary artery disease.  He has multiple lesions involving the LAD territory, a totally occluded circumflex territory and a severely diseased proximal right coronary artery.   Impression:  55 year old man with renal failure and heart failure.  Evaluation for heart failure etiology demonstrated multivessel coronary disease.  He is referred for CABG.  This would be technically feasible but it would be important to optimize his medical condition prior to operating.   Plan:  Admit to the hospital on 02/28/2021 for medical optimization including dialysis and  right heart catheterization.  He may also undergo repeat echo at the time and a CT scan of the chest to assess stroke risk around the time of surgery.  Tentatively plan CABG on 03/02/2021.   I spent in excess of 30 minutes during the conduct of this office consultation and >50% of this time involved direct face-to-face encounter with the patient for counseling and/or coordination of their care.          Level 3 Office Consult = 40 minutes         Level 4 Office Consult = 60 minutes         Level  5 Office Consult = 80 minutes  B.  Murvin Natal, MD 03/01/2021 3:57 PM

## 2021-03-15 ENCOUNTER — Inpatient Hospital Stay: Admission: RE | Admit: 2021-03-15 | Payer: Medicare Other | Source: Ambulatory Visit | Admitting: Internal Medicine

## 2021-03-15 ENCOUNTER — Inpatient Hospital Stay (HOSPITAL_COMMUNITY)
Admission: RE | Admit: 2021-03-15 | Discharge: 2021-04-23 | DRG: 003 | Disposition: E | Payer: Medicare Other | Source: Ambulatory Visit | Attending: Cardiothoracic Surgery | Admitting: Cardiothoracic Surgery

## 2021-03-15 DIAGNOSIS — Z951 Presence of aortocoronary bypass graft: Secondary | ICD-10-CM | POA: Diagnosis not present

## 2021-03-15 DIAGNOSIS — Z01818 Encounter for other preprocedural examination: Secondary | ICD-10-CM

## 2021-03-15 DIAGNOSIS — T8119XS Other postprocedural shock, sequela: Secondary | ICD-10-CM | POA: Diagnosis not present

## 2021-03-15 DIAGNOSIS — A419 Sepsis, unspecified organism: Secondary | ICD-10-CM | POA: Diagnosis not present

## 2021-03-15 DIAGNOSIS — Z978 Presence of other specified devices: Secondary | ICD-10-CM

## 2021-03-15 DIAGNOSIS — Z20822 Contact with and (suspected) exposure to covid-19: Secondary | ICD-10-CM | POA: Diagnosis not present

## 2021-03-15 DIAGNOSIS — T17908A Unspecified foreign body in respiratory tract, part unspecified causing other injury, initial encounter: Secondary | ICD-10-CM

## 2021-03-15 DIAGNOSIS — Z79899 Other long term (current) drug therapy: Secondary | ICD-10-CM

## 2021-03-15 DIAGNOSIS — J9621 Acute and chronic respiratory failure with hypoxia: Secondary | ICD-10-CM | POA: Diagnosis not present

## 2021-03-15 DIAGNOSIS — R4182 Altered mental status, unspecified: Secondary | ICD-10-CM | POA: Diagnosis not present

## 2021-03-15 DIAGNOSIS — G928 Other toxic encephalopathy: Secondary | ICD-10-CM | POA: Diagnosis not present

## 2021-03-15 DIAGNOSIS — D65 Disseminated intravascular coagulation [defibrination syndrome]: Secondary | ICD-10-CM | POA: Diagnosis not present

## 2021-03-15 DIAGNOSIS — Z9289 Personal history of other medical treatment: Secondary | ICD-10-CM

## 2021-03-15 DIAGNOSIS — D539 Nutritional anemia, unspecified: Secondary | ICD-10-CM | POA: Diagnosis present

## 2021-03-15 DIAGNOSIS — J690 Pneumonitis due to inhalation of food and vomit: Secondary | ICD-10-CM | POA: Diagnosis not present

## 2021-03-15 DIAGNOSIS — I083 Combined rheumatic disorders of mitral, aortic and tricuspid valves: Secondary | ICD-10-CM | POA: Diagnosis present

## 2021-03-15 DIAGNOSIS — R64 Cachexia: Secondary | ICD-10-CM | POA: Diagnosis present

## 2021-03-15 DIAGNOSIS — R54 Age-related physical debility: Secondary | ICD-10-CM | POA: Diagnosis present

## 2021-03-15 DIAGNOSIS — K7460 Unspecified cirrhosis of liver: Secondary | ICD-10-CM | POA: Diagnosis present

## 2021-03-15 DIAGNOSIS — Z91013 Allergy to seafood: Secondary | ICD-10-CM

## 2021-03-15 DIAGNOSIS — Z930 Tracheostomy status: Secondary | ICD-10-CM

## 2021-03-15 DIAGNOSIS — K761 Chronic passive congestion of liver: Secondary | ICD-10-CM | POA: Diagnosis present

## 2021-03-15 DIAGNOSIS — D696 Thrombocytopenia, unspecified: Secondary | ICD-10-CM | POA: Diagnosis not present

## 2021-03-15 DIAGNOSIS — Z9889 Other specified postprocedural states: Secondary | ICD-10-CM | POA: Diagnosis not present

## 2021-03-15 DIAGNOSIS — J9601 Acute respiratory failure with hypoxia: Secondary | ICD-10-CM

## 2021-03-15 DIAGNOSIS — Z992 Dependence on renal dialysis: Secondary | ICD-10-CM | POA: Diagnosis not present

## 2021-03-15 DIAGNOSIS — I6523 Occlusion and stenosis of bilateral carotid arteries: Secondary | ICD-10-CM | POA: Diagnosis present

## 2021-03-15 DIAGNOSIS — G931 Anoxic brain damage, not elsewhere classified: Secondary | ICD-10-CM | POA: Diagnosis not present

## 2021-03-15 DIAGNOSIS — E1165 Type 2 diabetes mellitus with hyperglycemia: Secondary | ICD-10-CM | POA: Diagnosis present

## 2021-03-15 DIAGNOSIS — E1151 Type 2 diabetes mellitus with diabetic peripheral angiopathy without gangrene: Secondary | ICD-10-CM | POA: Diagnosis present

## 2021-03-15 DIAGNOSIS — R5381 Other malaise: Secondary | ICD-10-CM | POA: Diagnosis not present

## 2021-03-15 DIAGNOSIS — R188 Other ascites: Principal | ICD-10-CM | POA: Diagnosis present

## 2021-03-15 DIAGNOSIS — Z888 Allergy status to other drugs, medicaments and biological substances status: Secondary | ICD-10-CM

## 2021-03-15 DIAGNOSIS — D72825 Bandemia: Secondary | ICD-10-CM | POA: Diagnosis not present

## 2021-03-15 DIAGNOSIS — I484 Atypical atrial flutter: Secondary | ICD-10-CM | POA: Diagnosis present

## 2021-03-15 DIAGNOSIS — I700 Atherosclerosis of aorta: Secondary | ICD-10-CM | POA: Diagnosis present

## 2021-03-15 DIAGNOSIS — N186 End stage renal disease: Secondary | ICD-10-CM | POA: Diagnosis not present

## 2021-03-15 DIAGNOSIS — J810 Acute pulmonary edema: Secondary | ICD-10-CM | POA: Diagnosis not present

## 2021-03-15 DIAGNOSIS — Z515 Encounter for palliative care: Secondary | ICD-10-CM

## 2021-03-15 DIAGNOSIS — D631 Anemia in chronic kidney disease: Secondary | ICD-10-CM | POA: Diagnosis present

## 2021-03-15 DIAGNOSIS — R569 Unspecified convulsions: Secondary | ICD-10-CM | POA: Diagnosis not present

## 2021-03-15 DIAGNOSIS — Z806 Family history of leukemia: Secondary | ICD-10-CM

## 2021-03-15 DIAGNOSIS — K766 Portal hypertension: Secondary | ICD-10-CM | POA: Diagnosis present

## 2021-03-15 DIAGNOSIS — L89150 Pressure ulcer of sacral region, unstageable: Secondary | ICD-10-CM | POA: Diagnosis not present

## 2021-03-15 DIAGNOSIS — J189 Pneumonia, unspecified organism: Secondary | ICD-10-CM

## 2021-03-15 DIAGNOSIS — K581 Irritable bowel syndrome with constipation: Secondary | ICD-10-CM | POA: Diagnosis present

## 2021-03-15 DIAGNOSIS — R109 Unspecified abdominal pain: Secondary | ICD-10-CM

## 2021-03-15 DIAGNOSIS — I270 Primary pulmonary hypertension: Secondary | ICD-10-CM | POA: Diagnosis not present

## 2021-03-15 DIAGNOSIS — I9589 Other hypotension: Secondary | ICD-10-CM | POA: Diagnosis not present

## 2021-03-15 DIAGNOSIS — J80 Acute respiratory distress syndrome: Secondary | ICD-10-CM | POA: Diagnosis not present

## 2021-03-15 DIAGNOSIS — Z66 Do not resuscitate: Secondary | ICD-10-CM | POA: Diagnosis not present

## 2021-03-15 DIAGNOSIS — T8119XA Other postprocedural shock, initial encounter: Secondary | ICD-10-CM | POA: Diagnosis not present

## 2021-03-15 DIAGNOSIS — Z9911 Dependence on respirator [ventilator] status: Secondary | ICD-10-CM | POA: Diagnosis not present

## 2021-03-15 DIAGNOSIS — J90 Pleural effusion, not elsewhere classified: Secondary | ICD-10-CM

## 2021-03-15 DIAGNOSIS — Y95 Nosocomial condition: Secondary | ICD-10-CM | POA: Diagnosis not present

## 2021-03-15 DIAGNOSIS — J9819 Other pulmonary collapse: Secondary | ICD-10-CM

## 2021-03-15 DIAGNOSIS — T85598A Other mechanical complication of other gastrointestinal prosthetic devices, implants and grafts, initial encounter: Secondary | ICD-10-CM

## 2021-03-15 DIAGNOSIS — I272 Pulmonary hypertension, unspecified: Secondary | ICD-10-CM | POA: Diagnosis present

## 2021-03-15 DIAGNOSIS — I2510 Atherosclerotic heart disease of native coronary artery without angina pectoris: Principal | ICD-10-CM | POA: Diagnosis present

## 2021-03-15 DIAGNOSIS — R570 Cardiogenic shock: Secondary | ICD-10-CM | POA: Diagnosis not present

## 2021-03-15 DIAGNOSIS — R401 Stupor: Secondary | ICD-10-CM | POA: Diagnosis not present

## 2021-03-15 DIAGNOSIS — E43 Unspecified severe protein-calorie malnutrition: Secondary | ICD-10-CM | POA: Insufficient documentation

## 2021-03-15 DIAGNOSIS — E1122 Type 2 diabetes mellitus with diabetic chronic kidney disease: Secondary | ICD-10-CM | POA: Diagnosis present

## 2021-03-15 DIAGNOSIS — Z7982 Long term (current) use of aspirin: Secondary | ICD-10-CM

## 2021-03-15 DIAGNOSIS — Z01810 Encounter for preprocedural cardiovascular examination: Secondary | ICD-10-CM | POA: Diagnosis not present

## 2021-03-15 DIAGNOSIS — G9340 Encephalopathy, unspecified: Secondary | ICD-10-CM | POA: Diagnosis not present

## 2021-03-15 DIAGNOSIS — R739 Hyperglycemia, unspecified: Secondary | ICD-10-CM | POA: Diagnosis not present

## 2021-03-15 DIAGNOSIS — N189 Chronic kidney disease, unspecified: Secondary | ICD-10-CM | POA: Diagnosis not present

## 2021-03-15 DIAGNOSIS — K529 Noninfective gastroenteritis and colitis, unspecified: Secondary | ICD-10-CM | POA: Diagnosis not present

## 2021-03-15 DIAGNOSIS — I5023 Acute on chronic systolic (congestive) heart failure: Secondary | ICD-10-CM | POA: Diagnosis not present

## 2021-03-15 DIAGNOSIS — I255 Ischemic cardiomyopathy: Secondary | ICD-10-CM | POA: Diagnosis present

## 2021-03-15 DIAGNOSIS — Z9689 Presence of other specified functional implants: Secondary | ICD-10-CM | POA: Diagnosis not present

## 2021-03-15 DIAGNOSIS — Z4659 Encounter for fitting and adjustment of other gastrointestinal appliance and device: Secondary | ICD-10-CM

## 2021-03-15 DIAGNOSIS — Z955 Presence of coronary angioplasty implant and graft: Secondary | ICD-10-CM

## 2021-03-15 DIAGNOSIS — J984 Other disorders of lung: Secondary | ICD-10-CM | POA: Diagnosis present

## 2021-03-15 DIAGNOSIS — D62 Acute posthemorrhagic anemia: Secondary | ICD-10-CM | POA: Diagnosis not present

## 2021-03-15 DIAGNOSIS — Z794 Long term (current) use of insulin: Secondary | ICD-10-CM

## 2021-03-15 DIAGNOSIS — Z7189 Other specified counseling: Secondary | ICD-10-CM | POA: Diagnosis not present

## 2021-03-15 DIAGNOSIS — I482 Chronic atrial fibrillation, unspecified: Secondary | ICD-10-CM | POA: Diagnosis present

## 2021-03-15 DIAGNOSIS — I132 Hypertensive heart and chronic kidney disease with heart failure and with stage 5 chronic kidney disease, or end stage renal disease: Secondary | ICD-10-CM | POA: Diagnosis present

## 2021-03-15 DIAGNOSIS — I9581 Postprocedural hypotension: Secondary | ICD-10-CM | POA: Diagnosis not present

## 2021-03-15 DIAGNOSIS — E11649 Type 2 diabetes mellitus with hypoglycemia without coma: Secondary | ICD-10-CM | POA: Diagnosis not present

## 2021-03-15 DIAGNOSIS — J9602 Acute respiratory failure with hypercapnia: Secondary | ICD-10-CM | POA: Diagnosis not present

## 2021-03-15 DIAGNOSIS — Z681 Body mass index (BMI) 19 or less, adult: Secondary | ICD-10-CM

## 2021-03-15 DIAGNOSIS — Z09 Encounter for follow-up examination after completed treatment for conditions other than malignant neoplasm: Secondary | ICD-10-CM

## 2021-03-15 DIAGNOSIS — J9690 Respiratory failure, unspecified, unspecified whether with hypoxia or hypercapnia: Secondary | ICD-10-CM

## 2021-03-15 DIAGNOSIS — R6521 Severe sepsis with septic shock: Secondary | ICD-10-CM | POA: Diagnosis not present

## 2021-03-15 DIAGNOSIS — E875 Hyperkalemia: Secondary | ICD-10-CM | POA: Diagnosis not present

## 2021-03-15 DIAGNOSIS — Z634 Disappearance and death of family member: Secondary | ICD-10-CM

## 2021-03-15 DIAGNOSIS — D72829 Elevated white blood cell count, unspecified: Secondary | ICD-10-CM

## 2021-03-15 DIAGNOSIS — I97120 Postprocedural cardiac arrest following cardiac surgery: Secondary | ICD-10-CM | POA: Diagnosis not present

## 2021-03-15 DIAGNOSIS — E785 Hyperlipidemia, unspecified: Secondary | ICD-10-CM | POA: Diagnosis present

## 2021-03-15 DIAGNOSIS — I97611 Postprocedural hemorrhage and hematoma of a circulatory system organ or structure following cardiac bypass: Secondary | ICD-10-CM | POA: Diagnosis not present

## 2021-03-15 DIAGNOSIS — I4901 Ventricular fibrillation: Secondary | ICD-10-CM | POA: Diagnosis not present

## 2021-03-15 DIAGNOSIS — H5704 Mydriasis: Secondary | ICD-10-CM | POA: Diagnosis not present

## 2021-03-15 DIAGNOSIS — R4189 Other symptoms and signs involving cognitive functions and awareness: Secondary | ICD-10-CM | POA: Diagnosis not present

## 2021-03-15 DIAGNOSIS — R404 Transient alteration of awareness: Secondary | ICD-10-CM | POA: Diagnosis not present

## 2021-03-15 DIAGNOSIS — D649 Anemia, unspecified: Secondary | ICD-10-CM | POA: Diagnosis not present

## 2021-03-15 DIAGNOSIS — Z808 Family history of malignant neoplasm of other organs or systems: Secondary | ICD-10-CM

## 2021-03-15 DIAGNOSIS — I9789 Other postprocedural complications and disorders of the circulatory system, not elsewhere classified: Secondary | ICD-10-CM | POA: Diagnosis not present

## 2021-03-15 DIAGNOSIS — Z789 Other specified health status: Secondary | ICD-10-CM

## 2021-03-15 DIAGNOSIS — N185 Chronic kidney disease, stage 5: Secondary | ICD-10-CM | POA: Diagnosis not present

## 2021-03-15 DIAGNOSIS — Z5309 Procedure and treatment not carried out because of other contraindication: Secondary | ICD-10-CM

## 2021-03-15 DIAGNOSIS — I469 Cardiac arrest, cause unspecified: Secondary | ICD-10-CM | POA: Diagnosis not present

## 2021-03-15 DIAGNOSIS — R253 Fasciculation: Secondary | ICD-10-CM | POA: Diagnosis not present

## 2021-03-15 DIAGNOSIS — E872 Acidosis: Secondary | ICD-10-CM | POA: Diagnosis not present

## 2021-03-15 DIAGNOSIS — Z880 Allergy status to penicillin: Secondary | ICD-10-CM

## 2021-03-15 DIAGNOSIS — R079 Chest pain, unspecified: Secondary | ICD-10-CM

## 2021-03-15 LAB — CBC WITH DIFFERENTIAL/PLATELET
Abs Immature Granulocytes: 0.03 10*3/uL (ref 0.00–0.07)
Basophils Absolute: 0.1 10*3/uL (ref 0.0–0.1)
Basophils Relative: 1 %
Eosinophils Absolute: 0.6 10*3/uL — ABNORMAL HIGH (ref 0.0–0.5)
Eosinophils Relative: 7 %
HCT: 31.6 % — ABNORMAL LOW (ref 39.0–52.0)
Hemoglobin: 9.9 g/dL — ABNORMAL LOW (ref 13.0–17.0)
Immature Granulocytes: 0 %
Lymphocytes Relative: 18 %
Lymphs Abs: 1.5 10*3/uL (ref 0.7–4.0)
MCH: 31.4 pg (ref 26.0–34.0)
MCHC: 31.3 g/dL (ref 30.0–36.0)
MCV: 100.3 fL — ABNORMAL HIGH (ref 80.0–100.0)
Monocytes Absolute: 0.7 10*3/uL (ref 0.1–1.0)
Monocytes Relative: 8 %
Neutro Abs: 5.2 10*3/uL (ref 1.7–7.7)
Neutrophils Relative %: 66 %
Platelets: 169 10*3/uL (ref 150–400)
RBC: 3.15 MIL/uL — ABNORMAL LOW (ref 4.22–5.81)
RDW: 12.8 % (ref 11.5–15.5)
WBC: 8 10*3/uL (ref 4.0–10.5)
nRBC: 0 % (ref 0.0–0.2)

## 2021-03-15 LAB — HEMOGLOBIN A1C
Hgb A1c MFr Bld: 6.5 % — ABNORMAL HIGH (ref 4.8–5.6)
Mean Plasma Glucose: 139.85 mg/dL

## 2021-03-15 LAB — PROTIME-INR
INR: 1.3 — ABNORMAL HIGH (ref 0.8–1.2)
Prothrombin Time: 16.5 seconds — ABNORMAL HIGH (ref 11.4–15.2)

## 2021-03-15 LAB — COMPREHENSIVE METABOLIC PANEL
ALT: 10 U/L (ref 0–44)
AST: 16 U/L (ref 15–41)
Albumin: 3 g/dL — ABNORMAL LOW (ref 3.5–5.0)
Alkaline Phosphatase: 90 U/L (ref 38–126)
Anion gap: 11 (ref 5–15)
BUN: 23 mg/dL — ABNORMAL HIGH (ref 6–20)
CO2: 26 mmol/L (ref 22–32)
Calcium: 7.8 mg/dL — ABNORMAL LOW (ref 8.9–10.3)
Chloride: 98 mmol/L (ref 98–111)
Creatinine, Ser: 5.27 mg/dL — ABNORMAL HIGH (ref 0.61–1.24)
GFR, Estimated: 12 mL/min — ABNORMAL LOW (ref >60)
Glucose, Bld: 127 mg/dL — ABNORMAL HIGH (ref 70–99)
Potassium: 3.6 mmol/L (ref 3.5–5.1)
Sodium: 135 mmol/L (ref 135–145)
Total Bilirubin: 1 mg/dL (ref 0.3–1.2)
Total Protein: 7.6 g/dL (ref 6.5–8.1)

## 2021-03-15 LAB — SARS CORONAVIRUS 2 (TAT 6-24 HRS): SARS Coronavirus 2: NEGATIVE

## 2021-03-15 LAB — TSH: TSH: 7.983 u[IU]/mL — ABNORMAL HIGH (ref 0.350–4.500)

## 2021-03-15 LAB — GLUCOSE, CAPILLARY: Glucose-Capillary: 130 mg/dL — ABNORMAL HIGH (ref 70–99)

## 2021-03-15 LAB — HIV ANTIBODY (ROUTINE TESTING W REFLEX): HIV Screen 4th Generation wRfx: NONREACTIVE

## 2021-03-15 LAB — MAGNESIUM: Magnesium: 1.8 mg/dL (ref 1.7–2.4)

## 2021-03-15 LAB — BRAIN NATRIURETIC PEPTIDE: B Natriuretic Peptide: 1127.8 pg/mL — ABNORMAL HIGH (ref 0.0–100.0)

## 2021-03-15 LAB — SURGICAL PCR SCREEN
MRSA, PCR: NEGATIVE
Staphylococcus aureus: NEGATIVE

## 2021-03-15 MED ORDER — SODIUM CHLORIDE 0.9 % IV SOLN: 250.0000 mL | INTRAVENOUS | Status: DC | PRN

## 2021-03-15 MED ORDER — INSULIN ASPART 100 UNIT/ML IJ SOLN: 0.0000 [IU] | Freq: Three times a day (TID) | INTRAMUSCULAR | Status: DC

## 2021-03-15 MED ORDER — SODIUM CHLORIDE 0.9% FLUSH
3.0000 mL | Freq: Two times a day (BID) | INTRAVENOUS | Status: DC
  Administered 2021-03-15 – 2021-03-23 (×10): 3 mL via INTRAVENOUS

## 2021-03-15 MED ORDER — ONDANSETRON HCL 4 MG/2ML IJ SOLN
4.0000 mg | Freq: Four times a day (QID) | INTRAMUSCULAR | Status: DC | PRN
  Administered 2021-03-20: 4 mg via INTRAVENOUS
  Filled 2021-03-15: qty 2

## 2021-03-15 MED ORDER — ACETAMINOPHEN 325 MG PO TABS
650.0000 mg | ORAL_TABLET | ORAL | Status: DC | PRN
  Administered 2021-03-18 – 2021-03-21 (×3): 650 mg via ORAL
  Filled 2021-03-15 (×3): qty 2

## 2021-03-15 MED ORDER — ASPIRIN 81 MG PO CHEW
81.0000 mg | CHEWABLE_TABLET | ORAL | Status: AC
  Administered 2021-03-16: 81 mg via ORAL
  Filled 2021-03-15: qty 1

## 2021-03-15 MED ORDER — ATORVASTATIN CALCIUM 40 MG PO TABS
40.0000 mg | ORAL_TABLET | Freq: Every day | ORAL | Status: DC
  Administered 2021-03-15 – 2021-04-01 (×16): 40 mg via ORAL
  Filled 2021-03-15 (×17): qty 1

## 2021-03-15 MED ORDER — MUPIROCIN 2 % EX OINT
1.0000 "application " | TOPICAL_OINTMENT | Freq: Two times a day (BID) | CUTANEOUS | Status: DC
  Administered 2021-03-15 – 2021-03-19 (×7): 1 via NASAL
  Filled 2021-03-15: qty 22

## 2021-03-15 MED ORDER — SODIUM CHLORIDE 0.9% FLUSH: 3.0000 mL | INTRAVENOUS | Status: DC | PRN

## 2021-03-15 MED ORDER — SODIUM CHLORIDE 0.9 % IV SOLN: INTRAVENOUS | Status: DC

## 2021-03-15 MED ORDER — METOPROLOL SUCCINATE ER 50 MG PO TB24
50.0000 mg | ORAL_TABLET | Freq: Every evening | ORAL | Status: DC
  Administered 2021-03-15 – 2021-03-22 (×7): 50 mg via ORAL
  Filled 2021-03-15 (×8): qty 1

## 2021-03-15 MED ORDER — SODIUM CHLORIDE 0.9% FLUSH
3.0000 mL | Freq: Two times a day (BID) | INTRAVENOUS | Status: DC
  Administered 2021-03-17 – 2021-03-23 (×8): 3 mL via INTRAVENOUS

## 2021-03-15 MED ORDER — ENOXAPARIN SODIUM 30 MG/0.3ML IJ SOSY
30.0000 mg | PREFILLED_SYRINGE | INTRAMUSCULAR | Status: DC
  Administered 2021-03-15 – 2021-03-23 (×7): 30 mg via SUBCUTANEOUS
  Filled 2021-03-15 (×7): qty 0.3

## 2021-03-15 MED ORDER — NITROGLYCERIN 0.4 MG SL SUBL: 0.4000 mg | SUBLINGUAL_TABLET | SUBLINGUAL | Status: DC | PRN

## 2021-03-15 NOTE — H&P (Addendum)
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   Has had heart failure dating back to 2018 with EF ~ 25%. EF over the last few years improved to 40-45%.   Back in 2020 had had US abdomen with cirrhotic liver morphology and evidence of portal hypertension and moderate ascites.   Most recent ECHO 01/2021 showed EF 40-45%, RV moderately reduced, RVSP 99.  Had virtual  visit with Dr Harl Bowie and was set up for cath.   Lives with his sister. Denies chest pain. SOB with steps. Dry weight 79 kg. HD T/Thurs/Sat. Walks with a cane and has difficulty with his balance.   RHC/LHC 02/24/21   Ost RCA lesion is 85% stenosed.  Mid LM to Dist LM lesion is 20% stenosed.  Prox LAD lesion is 80% stenosed.  1st Diag lesion is 80% stenosed.  Mid LAD lesion is 80% stenosed.  Dist LAD lesion is 80% stenosed.  Ost Cx to Prox Cx lesion is 80% stenosed.  Mid Cx to Dist Cx lesion is 100% stenosed.  2nd RPL lesion is 20% stenosed.  Lat 2nd Diag lesion is 80% stenosed.  RA 10, PA 65/25 (42), PCWP 19, CO 10, and CI 5. PVR 2.2   01/27/21  Left ventricular ejection fraction, by estimation, is 40 to 45%. The left ventricle has mildly decreased function. The left ventricle demonstrates regional wall motion abnormalities (see scoring diagram/findings for description). Left ventricular diastolic parameters are indeterminate. 2. Right ventricular systolic function is moderately reduced. The right ventricular size is mildly enlarged. There is severely elevated pulmonary artery systolic pressure. The estimated right ventricular systolic pressure is [id number] mmHg. 3. Left atrial size was mildly dilated. 4.  Right atrial size was mild to moderately dilated. 5. Left pleural effusion and ascites present. 6. The mitral valve is grossly normal. Mild mitral valve regurgitation. 7. Tricuspid valve regurgitation is moderate. 8. The aortic valve is tricuspid. Aortic valve regurgitation is not visualized. Mild to moderate aortic valve sclerosis/calcification is present, without any evidence of aortic stenosis. Aortic valve mean gradient measures 4.0 mmHg. 9. The inferior vena cava is dilated in size with <50% respiratory variability, suggesting right atrial pressure of 15 mmHg.  Review of Systems: [y] = yes, '[ ]'$  = no   . General: Weight gain '[ ]'$ ; Weight loss '[ ]'$ ; Anorexia '[ ]'$ ; Fatigue [ Y]; Fever '[ ]'$ ; Chills '[ ]'$ ; Weakness [Y ]  . Cardiac: Chest pain/pressure '[ ]'$ ; Resting SOB '[ ]'$ ; Exertional SOB [Y ]; Orthopnea '[ ]'$ ; Pedal Edema '[ ]'$ ; Palpitations '[ ]'$ ; Syncope '[ ]'$ ; Presyncope '[ ]'$ ; Paroxysmal nocturnal dyspnea'[ ]'$   . Pulmonary: Cough '[ ]'$ ; Wheezing'[ ]'$ ; Hemoptysis'[ ]'$ ; Sputum '[ ]'$ ; Snoring '[ ]'$   . GI: Vomiting'[ ]'$ ; Dysphagia'[ ]'$ ; Melena'[ ]'$ ; Hematochezia '[ ]'$ ; Heartburn'[ ]'$ ; Abdominal pain '[ ]'$ ; Constipation '[ ]'$ ; Diarrhea '[ ]'$ ; BRBPR '[ ]'$   . GU: Hematuria'[ ]'$ ; Dysuria '[ ]'$ ; Nocturia'[ ]'$   . Vascular: Pain in legs with walking [ Y ]; Pain in feet with lying flat '[ ]'$ ; Non-healing sores '[ ]'$ ; Stroke '[ ]'$ ; TIA '[ ]'$ ; Slurred speech '[ ]'$ ;  . Neuro: Headaches'[ ]'$ ; Vertigo'[ ]'$ ; Seizures'[ ]'$ ;  Paresthesias'[ ]'$ ;Blurred vision '[ ]'$ ; Diplopia '[ ]'$ ; Vision changes '[ ]'$   . Ortho/Skin: Arthritis '[ ]'$ ; Joint pain [Y]; Muscle pain '[ ]'$ ; Joint swelling '[ ]'$ ; Back Pain [Y ]; Rash '[ ]'$   . Psych: Depression'[ ]'$ ; Anxiety'[ ]'$   . Heme: Bleeding problems '[ ]'$ ; Clotting disorders '[ ]'$ ; Anemia '[ ]'$   . Endocrine: Diabetes '[ ]'$ ; Thyroid dysfunction'[ ]'$    Home Medications Prior to Admission medications   Medication Sig Start Date End Date Taking? Authorizing Provider  Eluxadoline (VIBERZI) 75 MG TABS Take 75 mg by mouth in the morning and at bedtime.   Yes [provider]  furosemide (LASIX) 80 MG tablet Take 80 mg by mouth daily as needed for fluid. 02/20/20  Yes [provider]  lidocaine-prilocaine (EMLA) cream Apply 1 application topically as needed (before dialysis).   Yes [provider]  metoprolol succinate (TOPROL-XL) 50 MG 24 hr tablet Take 50 mg by mouth every evening. Take with or immediately following a meal.   Yes [provider]  nitroGLYCERIN (NITROSTAT) 0.4 MG SL tablet Place 1 tablet (0.4 mg total) under the tongue every 5 (five) minutes as needed. Patient taking differently: Place 0.4 mg under the tongue every 5 (five) minutes x 3 doses as needed for chest pain. 02/24/21  Yes Reino Bellis B, NP  sevelamer carbonate (RENVELA) 800 MG tablet Take 1,600 mg by mouth 3 (three) times daily with meals.   Yes [provider]    Past Medical History: Past Medical History:  Diagnosis Date  . ESRD (end stage renal disease) ([place])   . Hypertension   . Peripheral arterial disease ([place])   . Type 2 diabetes mellitus ([place])     Past Surgical History: Past Surgical History:  Procedure Laterality Date  . AV FISTULA PLACEMENT Right   . PARACENTESIS    . RIGHT/LEFT HEART CATH AND CORONARY ANGIOGRAPHY N/A 02/24/2021   Procedure: RIGHT/LEFT HEART CATH AND CORONARY ANGIOGRAPHY;  Surgeon: Troy Sine, MD;  Location: [facility];  Service: Cardiovascular;  Laterality: N/A;    Family History:  Family History  Problem Relation Age of Onset  . Thyroid cancer Mother   . Leukemia Father     Social History: Social History   Socioeconomic History  . Marital status: Divorced    Spouse name: Not on file  . Number of children: Not on file  . Years of education: Not on file  . Highest education level: Not on file  Occupational History  . Not on file  Tobacco Use  . Smoking status: Never Smoker  . Smokeless tobacco: Never Used  Substance and Sexual Activity  . Alcohol use: Not on file  . Drug  use: Not on file  . Sexual activity: Not on file  Other Topics Concern  . Not on file  Social History Narrative   Pt has 1 year of college.    Social Determinants of Health   Financial Resource Strain: Not on file  Food Insecurity: Not on file  Transportation Needs: Not on file  Physical Activity: Not on file  Stress: Not on file  Social Connections: Not on file    Allergies:  Allergies  Allergen Reactions  . Shellfish-Derived Products Anaphylaxis  . Wasp Venom Protein Anaphylaxis  . Penicillins Other (See Comments)    Childhood reaction    Objective:    Vital Signs:   Temp:  [97.9 F (36.6 C)] 97.9 F (36.6 C) (05/23 1347) Pulse Rate:  [69]  69 (05/23 1347) Resp:  [14] 14 (05/23 1347) BP: (128)/(68) 128/68 (05/23 1347) SpO2:  [99 %] 99 % (05/23 1347) Last BM Date: 03/09/2021 There were no vitals filed for this visit.   Physical Exam     General: No respiratory difficulty HEENT: Normal Neck: Supple. no JVD. Carotids 2+ bilat; no bruits. No lymphadenopathy or thyromegaly appreciated. Cor: PMI nondisplaced. Regular rate & rhythm. No rubs, gallops or murmurs. Lungs: Clear Abdomen: Soft, nontender, nondistended. No hepatosplenomegaly. No bruits or masses. Good bowel sounds. Extremities: No cyanosis, clubbing, rash, edema. LUE AVF  Neuro: Alert & oriented x 3, cranial nerves grossly intact. moves all 4 extremities w/o difficulty. Affect pleasant.   Telemetry   Sr 60s   EKG   SR 60s   Labs     Basic Metabolic Panel: No results for input(s): NA, K, CL, CO2, GLUCOSE, BUN, CREATININE, CALCIUM, MG, PHOS in the last 168 hours.  Liver Function Tests: No results for input(s): AST, ALT, ALKPHOS, BILITOT, PROT, ALBUMIN in the last 168 hours. No results for input(s): LIPASE, AMYLASE in the last 168 hours. No results for input(s): AMMONIA in the last 168 hours.  CBC: No results for input(s): WBC, NEUTROABS, HGB, HCT, MCV, PLT in the last 168 hours.  Cardiac  Enzymes: No results for input(s): CKTOTAL, CKMB, CKMBINDEX, TROPONINI in the last 168 hours.  BNP: BNP (last 3 results) No results for input(s): BNP in the last 8760 hours.  ProBNP (last 3 results) No results for input(s): PROBNP in the last 8760 hours.   CBG: No results for input(s): GLUCAP in the last 168 hours.  Coagulation Studies: No results for input(s): LABPROT, INR in the last 72 hours.  Imaging:  No results found.    Assessment/Plan    1. CAD with multivessel disease -Plan for CABG later this week pending additional imaging/procedures.   2. Chronic Systolic Heart Failure, ICM  ECHO Ef 40-45%. Volume managed with dialysis.  No spiro/dig/arni with ESRD - Continue home dose metoprolol   3. ESRD -Followed by [facility]ist in [place]. -HD on T/Thur/Sat - Consult nephrology   4. Pulmonary Hypertension  - Elevated PA pressure on ECHO  - RHC 02/24/21 PA 65/25 (42), PCWP 19 C) 10.  - RHC tomorrow will consider adding  PDE 5 based on results. Unclear the reason for high cardiac output.Possible due to HD catheter or could also be due to liver diseaes.   5. DMII  Add SSI Check Hgb A1C   6. Cirrhosis: Abdominal US with cirrhosis in 2020, h/o paracentesis.  Viral hepatitides ruled out. ?Due to heart disease.   Plan for RHC tomorrow. High cardiac output with PA sat 80%. ? Shunt ? HD catheter  Check VQ consult.    David Grinder, NP 03/11/2021, 2:45 PM  Advanced Heart Failure Team Pager [phone] (M-F; 7a - 5p)  Please contact [facility] for night-coverage after hours (4p -7a ) and weekends on [email or web address]  Patient seen with NP, agree with the above note.   Patient has history of ESRD due to DM and CHF with mid-range EF (LV EF 40-45% with moderately decreased RV systolic function and PASP 99 on 4/22 echo) as well as cirrhosis of uncertain etiology.  Based on low EF and elevated PA pressure, right and left heart cath was done in 5/22.  This showed severe 3VD, high  cardiac output with moderate pulmonary hypertension but low PVR.  He is planned for CABG, but admitted pre-op for optimization.   General: NAD  Neck: JVP 9-10 cm, no thyromegaly or thyroid nodule.  Lungs: Clear to auscultation bilaterally with normal respiratory effort. CV: Nondisplaced PMI.  Heart regular S1/S2, no S3/S4, 2/6 HSM LLSB.  No peripheral edema.  No carotid bruit.  Normal pedal pulses.  Abdomen: Soft, nontender, no hepatosplenomegaly, no distention.  Skin: Intact without lesions or rashes.  Neurologic: Alert and oriented x 3.  Psych: Normal affect. Extremities: No clubbing or cyanosis.  HEENT: Normal.   1. CAD: Severe 3VD with decreased EF, agree that CABG would be best treatment approach. No chest pain.  - Continue ASA 81.  - Add atorvastatin 40 mg daily.  - Continue Toprol XL 50 mg daily.  - Plan for CABG Wednesday.  2. Chronic HF with mid range EF: Suspect ischemic cardiomyopathy.  Echo 4/22 with EF 40-45% with moderately decreased RV systolic function and PASP 99, moderate TR. Mild volume overload on exam.  - Continue Toprol XL 50 mg daily.  - Tomorrow will need HD.  3. ESRD: Suspected due to diabetes.  Notify renal he is here, needs HD tomorrow.  4. Pulmonary hypertension: Reviewed RHC from 5/22, suspect PH from high cardiac output in the setting of chronic liver disease. With low PVR, portopulmonary hypertension not likely.  Oxygen saturation is 99% on RA, so no evidence for hepatopulmonary syndrome.   - Repeat RHC with shunt run to ensure no left=>right shunt has been missed, but as above suspect elevated PA pressure is due to high CO from liver disease.  5. Cirrhosis: Noted on abdominal US from 2020.  H/o paracentesis.  Had workup at [facility], viral hepatitis labs negative.  They ended up think that the cirrhosis was cardiogenic.  - Would repeat abdominal US to reassess liver.  - He will need CMET and INR.   Loralie Champagne 03/20/2021 4:00 PM

## 2021-03-16 ENCOUNTER — Encounter (HOSPITAL_COMMUNITY): Payer: Self-pay | Admitting: Certified Registered Nurse Anesthetist

## 2021-03-16 ENCOUNTER — Inpatient Hospital Stay (HOSPITAL_COMMUNITY): Payer: Medicare Other

## 2021-03-16 ENCOUNTER — Encounter (HOSPITAL_COMMUNITY): Payer: Self-pay | Admitting: Cardiology

## 2021-03-16 ENCOUNTER — Encounter (HOSPITAL_COMMUNITY): Payer: Medicare Other

## 2021-03-16 ENCOUNTER — Encounter (HOSPITAL_COMMUNITY): Admission: RE | Disposition: E | Payer: Self-pay | Source: Ambulatory Visit | Attending: Cardiothoracic Surgery

## 2021-03-16 DIAGNOSIS — I272 Pulmonary hypertension, unspecified: Secondary | ICD-10-CM | POA: Diagnosis not present

## 2021-03-16 DIAGNOSIS — Z01810 Encounter for preprocedural cardiovascular examination: Secondary | ICD-10-CM | POA: Diagnosis not present

## 2021-03-16 DIAGNOSIS — I5023 Acute on chronic systolic (congestive) heart failure: Secondary | ICD-10-CM | POA: Diagnosis not present

## 2021-03-16 DIAGNOSIS — I2510 Atherosclerotic heart disease of native coronary artery without angina pectoris: Secondary | ICD-10-CM | POA: Diagnosis not present

## 2021-03-16 DIAGNOSIS — N186 End stage renal disease: Secondary | ICD-10-CM | POA: Diagnosis not present

## 2021-03-16 HISTORY — PX: RIGHT HEART CATH: CATH118263

## 2021-03-16 LAB — CBC
HCT: 30.3 % — ABNORMAL LOW (ref 39.0–52.0)
Hemoglobin: 9.5 g/dL — ABNORMAL LOW (ref 13.0–17.0)
MCH: 31.9 pg (ref 26.0–34.0)
MCHC: 31.4 g/dL (ref 30.0–36.0)
MCV: 101.7 fL — ABNORMAL HIGH (ref 80.0–100.0)
Platelets: 165 10*3/uL (ref 150–400)
RBC: 2.98 MIL/uL — ABNORMAL LOW (ref 4.22–5.81)
RDW: 12.8 % (ref 11.5–15.5)
WBC: 5.9 10*3/uL (ref 4.0–10.5)
nRBC: 0 % (ref 0.0–0.2)

## 2021-03-16 LAB — POCT I-STAT EG7
Acid-Base Excess: 0 mmol/L (ref 0.0–2.0)
Acid-Base Excess: 1 mmol/L (ref 0.0–2.0)
Bicarbonate: 25.4 mmol/L (ref 20.0–28.0)
Bicarbonate: 26.8 mmol/L (ref 20.0–28.0)
Calcium, Ion: 1.04 mmol/L — ABNORMAL LOW (ref 1.15–1.40)
Calcium, Ion: 1.06 mmol/L — ABNORMAL LOW (ref 1.15–1.40)
HCT: 30 % — ABNORMAL LOW (ref 39.0–52.0)
HCT: 31 % — ABNORMAL LOW (ref 39.0–52.0)
Hemoglobin: 10.2 g/dL — ABNORMAL LOW (ref 13.0–17.0)
Hemoglobin: 10.5 g/dL — ABNORMAL LOW (ref 13.0–17.0)
O2 Saturation: 69 %
O2 Saturation: 72 %
Potassium: 3.9 mmol/L (ref 3.5–5.1)
Potassium: 3.9 mmol/L (ref 3.5–5.1)
Sodium: 138 mmol/L (ref 135–145)
Sodium: 138 mmol/L (ref 135–145)
TCO2: 27 mmol/L (ref 22–32)
TCO2: 28 mmol/L (ref 22–32)
pCO2, Ven: 45.8 mmHg (ref 44.0–60.0)
pCO2, Ven: 48.3 mmHg (ref 44.0–60.0)
pH, Ven: 7.352 (ref 7.250–7.430)
pH, Ven: 7.352 (ref 7.250–7.430)
pO2, Ven: 38 mmHg (ref 32.0–45.0)
pO2, Ven: 40 mmHg (ref 32.0–45.0)

## 2021-03-16 LAB — LIPID PANEL
Cholesterol: 108 mg/dL (ref 0–200)
HDL: 32 mg/dL — ABNORMAL LOW (ref >40)
LDL Cholesterol: 66 mg/dL (ref 0–99)
Total CHOL/HDL Ratio: 3.4 RATIO
Triglycerides: 50 mg/dL (ref <150)
VLDL: 10 mg/dL (ref 0–40)

## 2021-03-16 LAB — GLUCOSE, CAPILLARY
Glucose-Capillary: 99 mg/dL (ref 70–99)
Glucose-Capillary: 139 mg/dL — ABNORMAL HIGH (ref 70–99)

## 2021-03-16 LAB — CREATININE, SERUM
Creatinine, Ser: 5.84 mg/dL — ABNORMAL HIGH (ref 0.61–1.24)
GFR, Estimated: 11 mL/min — ABNORMAL LOW (ref >60)

## 2021-03-16 LAB — BASIC METABOLIC PANEL
Anion gap: 9 (ref 5–15)
BUN: 27 mg/dL — ABNORMAL HIGH (ref 6–20)
CO2: 28 mmol/L (ref 22–32)
Calcium: 8.1 mg/dL — ABNORMAL LOW (ref 8.9–10.3)
Chloride: 97 mmol/L — ABNORMAL LOW (ref 98–111)
Creatinine, Ser: 5.75 mg/dL — ABNORMAL HIGH (ref 0.61–1.24)
GFR, Estimated: 11 mL/min — ABNORMAL LOW (ref >60)
Glucose, Bld: 104 mg/dL — ABNORMAL HIGH (ref 70–99)
Potassium: 3.6 mmol/L (ref 3.5–5.1)
Sodium: 134 mmol/L — ABNORMAL LOW (ref 135–145)

## 2021-03-16 LAB — HEPATITIS B CORE ANTIBODY, TOTAL: Hep B Core Total Ab: NONREACTIVE

## 2021-03-16 LAB — HEPATITIS B SURFACE ANTIBODY,QUALITATIVE: Hep B S Ab: REACTIVE — AB

## 2021-03-16 LAB — HEPATITIS B SURFACE ANTIGEN: Hepatitis B Surface Ag: NONREACTIVE

## 2021-03-16 SURGERY — RIGHT HEART CATH
Anesthesia: LOCAL

## 2021-03-16 MED ORDER — EPINEPHRINE HCL 5 MG/250ML IV SOLN IN NS
0.0000 ug/min | INTRAVENOUS | Status: DC
  Filled 2021-03-16: qty 250

## 2021-03-16 MED ORDER — VANCOMYCIN HCL 1250 MG/250ML IV SOLN
1250.0000 mg | INTRAVENOUS | Status: DC
  Filled 2021-03-16: qty 250

## 2021-03-16 MED ORDER — ACETAMINOPHEN 325 MG PO TABS: 650.0000 mg | ORAL_TABLET | ORAL | Status: DC | PRN

## 2021-03-16 MED ORDER — LIDOCAINE HCL (PF) 1 % IJ SOLN: 5.0000 mL | INTRAMUSCULAR | Status: DC | PRN

## 2021-03-16 MED ORDER — HEPARIN SODIUM (PORCINE) 1000 UNIT/ML DIALYSIS: 1000.0000 [IU] | INTRAMUSCULAR | Status: DC | PRN

## 2021-03-16 MED ORDER — LIDOCAINE HCL (PF) 1 % IJ SOLN
INTRAMUSCULAR | Status: AC
  Filled 2021-03-16: qty 30

## 2021-03-16 MED ORDER — NITROGLYCERIN IN D5W 200-5 MCG/ML-% IV SOLN
2.0000 ug/min | INTRAVENOUS | Status: DC
  Filled 2021-03-16: qty 250

## 2021-03-16 MED ORDER — ALTEPLASE 2 MG IJ SOLR: 2.0000 mg | Freq: Once | INTRAMUSCULAR | Status: DC | PRN

## 2021-03-16 MED ORDER — FENTANYL CITRATE (PF) 100 MCG/2ML IJ SOLN
INTRAMUSCULAR | Status: DC | PRN
  Administered 2021-03-16: 25 ug via INTRAVENOUS

## 2021-03-16 MED ORDER — ONDANSETRON HCL 4 MG/2ML IJ SOLN: 4.0000 mg | Freq: Four times a day (QID) | INTRAMUSCULAR | Status: DC | PRN

## 2021-03-16 MED ORDER — LABETALOL HCL 5 MG/ML IV SOLN: 10.0000 mg | INTRAVENOUS | Status: AC | PRN

## 2021-03-16 MED ORDER — SODIUM CHLORIDE 0.9 % IV SOLN: 250.0000 mL | INTRAVENOUS | Status: DC | PRN

## 2021-03-16 MED ORDER — MILRINONE LACTATE IN DEXTROSE 20-5 MG/100ML-% IV SOLN
0.3000 ug/kg/min | INTRAVENOUS | Status: DC
  Filled 2021-03-16: qty 100

## 2021-03-16 MED ORDER — SODIUM CHLORIDE 0.9 % IV SOLN
INTRAVENOUS | Status: DC
  Filled 2021-03-16: qty 30

## 2021-03-16 MED ORDER — INSULIN REGULAR(HUMAN) IN NACL 100-0.9 UT/100ML-% IV SOLN
INTRAVENOUS | Status: DC
  Filled 2021-03-16: qty 100

## 2021-03-16 MED ORDER — CHLORHEXIDINE GLUCONATE CLOTH 2 % EX PADS
6.0000 | MEDICATED_PAD | Freq: Every day | CUTANEOUS | Status: DC
  Administered 2021-03-17 – 2021-03-19 (×3): 6 via TOPICAL

## 2021-03-16 MED ORDER — TRANEXAMIC ACID 1000 MG/10ML IV SOLN
1.5000 mg/kg/h | INTRAVENOUS | Status: DC
  Filled 2021-03-16: qty 25

## 2021-03-16 MED ORDER — TRANEXAMIC ACID (OHS) BOLUS VIA INFUSION
15.0000 mg/kg | INTRAVENOUS | Status: DC
  Filled 2021-03-16: qty 1220

## 2021-03-16 MED ORDER — LIDOCAINE HCL (PF) 1 % IJ SOLN
INTRAMUSCULAR | Status: DC | PRN
  Administered 2021-03-16: 10 mL

## 2021-03-16 MED ORDER — SODIUM CHLORIDE 0.9 % IV SOLN: 100.0000 mL | INTRAVENOUS | Status: DC | PRN

## 2021-03-16 MED ORDER — MAGNESIUM SULFATE 50 % IJ SOLN
40.0000 meq | INTRAMUSCULAR | Status: DC
  Filled 2021-03-16: qty 9.85

## 2021-03-16 MED ORDER — PLASMA-LYTE 148 IV SOLN
INTRAVENOUS | Status: DC
  Filled 2021-03-16: qty 2.5

## 2021-03-16 MED ORDER — SODIUM CHLORIDE 0.9% FLUSH
3.0000 mL | INTRAVENOUS | Status: DC | PRN
  Administered 2021-03-19: 3 mL via INTRAVENOUS

## 2021-03-16 MED ORDER — DEXMEDETOMIDINE HCL IN NACL 400 MCG/100ML IV SOLN
0.1000 ug/kg/h | INTRAVENOUS | Status: DC
  Filled 2021-03-16: qty 100

## 2021-03-16 MED ORDER — LIDOCAINE-PRILOCAINE 2.5-2.5 % EX CREA: 1.0000 "application " | TOPICAL_CREAM | CUTANEOUS | Status: DC | PRN

## 2021-03-16 MED ORDER — HYDRALAZINE HCL 20 MG/ML IJ SOLN: 10.0000 mg | INTRAMUSCULAR | Status: AC | PRN

## 2021-03-16 MED ORDER — FENTANYL CITRATE (PF) 100 MCG/2ML IJ SOLN
INTRAMUSCULAR | Status: AC
  Filled 2021-03-16: qty 2

## 2021-03-16 MED ORDER — MIDAZOLAM HCL 2 MG/2ML IJ SOLN
INTRAMUSCULAR | Status: AC
  Filled 2021-03-16: qty 2

## 2021-03-16 MED ORDER — TRANEXAMIC ACID (OHS) PUMP PRIME SOLUTION
2.0000 mg/kg | INTRAVENOUS | Status: DC
  Filled 2021-03-16: qty 1.63

## 2021-03-16 MED ORDER — SODIUM CHLORIDE 0.9 % IV SOLN: INTRAVENOUS | Status: DC

## 2021-03-16 MED ORDER — HEPARIN (PORCINE) IN NACL 1000-0.9 UT/500ML-% IV SOLN
INTRAVENOUS | Status: AC
  Filled 2021-03-16: qty 500

## 2021-03-16 MED ORDER — ENOXAPARIN SODIUM 30 MG/0.3ML IJ SOSY: 30.0000 mg | PREFILLED_SYRINGE | INTRAMUSCULAR | Status: DC

## 2021-03-16 MED ORDER — PHENYLEPHRINE HCL-NACL 20-0.9 MG/250ML-% IV SOLN
30.0000 ug/min | INTRAVENOUS | Status: DC
  Filled 2021-03-16: qty 250

## 2021-03-16 MED ORDER — NOREPINEPHRINE 4 MG/250ML-% IV SOLN
0.0000 ug/min | INTRAVENOUS | Status: DC
  Filled 2021-03-16: qty 250

## 2021-03-16 MED ORDER — CEFAZOLIN SODIUM-DEXTROSE 2-4 GM/100ML-% IV SOLN
2.0000 g | INTRAVENOUS | Status: DC
  Filled 2021-03-16: qty 100

## 2021-03-16 MED ORDER — PENTAFLUOROPROP-TETRAFLUOROETH EX AERO: 1.0000 "application " | INHALATION_SPRAY | CUTANEOUS | Status: DC | PRN

## 2021-03-16 MED ORDER — MIDAZOLAM HCL 2 MG/2ML IJ SOLN
INTRAMUSCULAR | Status: DC | PRN
  Administered 2021-03-16: 1 mg via INTRAVENOUS

## 2021-03-16 MED ORDER — SODIUM CHLORIDE 0.9% FLUSH
3.0000 mL | Freq: Two times a day (BID) | INTRAVENOUS | Status: DC
  Administered 2021-03-17 – 2021-03-23 (×9): 3 mL via INTRAVENOUS

## 2021-03-16 MED ORDER — HEPARIN (PORCINE) IN NACL 1000-0.9 UT/500ML-% IV SOLN
INTRAVENOUS | Status: DC | PRN
  Administered 2021-03-16: 500 mL

## 2021-03-16 MED ORDER — POTASSIUM CHLORIDE 2 MEQ/ML IV SOLN
80.0000 meq | INTRAVENOUS | Status: DC
  Filled 2021-03-16: qty 40

## 2021-03-16 SURGICAL SUPPLY — 8 items
CATH SWAN GANZ 7F STRAIGHT (CATHETERS) ×1 IMPLANT
CATH SWAN GANZ VIP 7.5F (CATHETERS) ×1 IMPLANT
GLIDESHEATH SLENDER 7FR .021G (SHEATH) ×1 IMPLANT
KIT HEART LEFT (KITS) ×2 IMPLANT
PACK CARDIAC CATHETERIZATION (CUSTOM PROCEDURE TRAY) ×2 IMPLANT
SHEATH PINNACLE 7F 10CM (SHEATH) ×1 IMPLANT
SHEATH PROBE COVER 6X72 (BAG) ×1 IMPLANT
TRANSDUCER W/STOPCOCK (MISCELLANEOUS) ×2 IMPLANT

## 2021-03-16 NOTE — Interval H&P Note (Signed)
History and Physical Interval Note:  03/11/2021 12:42 PM  David Rose.  has presented today for surgery, with the diagnosis of heart failure.  The various methods of treatment have been discussed with the patient and family. After consideration of risks, benefits and other options for treatment, the patient has consented to  Procedure(s): RIGHT HEART CATH (N/A) as a surgical intervention.  The patient's history has been reviewed, patient examined, no change in status, stable for surgery.  I have reviewed the patient's chart and labs.  Questions were answered to the patient's satisfaction.     [name] [employer]

## 2021-03-16 NOTE — Progress Notes (Addendum)
Advanced Heart Failure Rounding Note  PCP-Cardiologist: Carlyle Dolly, MD    Patient Profile   55 y/o male w/  history of ESRD due to DM and CHF with mid-range EF (LV EF 40-45% with moderately decreased RV systolic function and PASP 99 on 4/22 echo) as well as cirrhosis of uncertain etiology.  Based on low EF and elevated PA pressure, right and left heart cath was done in 5/22.  This showed severe 3VD, high cardiac output with moderate pulmonary hypertension but low PVR.  He is planned for CABG, but admitted pre-op for optimization.    Subjective:    CP free. No dyspnea. O2 sats 97% on RA.   Sitting up in bed. No complaints. Resting comfortably.   Repeat abdominal US 5/23 w/ nodular hepatic parenchymal pattern with increased echogenicity consistent with the patient's known cirrhosis. No focal hepatic abnormality identified. Portal vein is patent. Moderate ascites. Gallstones noted.  - LFTs normal. INR 1.3     Objective:   Weight Range: 81.3 kg Body mass index is 23.01 kg/m.   Vital Signs:   Temp:  [97.7 F (36.5 C)-98.2 F (36.8 C)] 97.7 F (36.5 C) (05/24 0300) Pulse Rate:  [63-71] 65 (05/24 0300) Resp:  [13-17] 14 (05/24 0300) BP: (118-146)/(60-74) 118/60 (05/24 0300) SpO2:  [98 %-100 %] 99 % (05/24 0300) Weight:  [79.4 kg-81.3 kg] 81.3 kg (05/24 0300) Last BM Date: 03/04/2021  Weight change: Filed Weights   03/05/2021 1514 03/21/2021 0300  Weight: 79.4 kg 81.3 kg    Intake/Output:   Intake/Output Summary (Last 24 hours) at 02/28/2021 0731 Last data filed at 02/24/2021 2126 Gross per 24 hour  Intake 3 ml  Output --  Net 3 ml      Physical Exam    General:  Well appearing, thin middle aged WM. No resp difficulty HEENT: Normal Neck: Supple. JVP elevated to jaw . Carotids 2+ bilat; no bruits. No lymphadenopathy or thyromegaly appreciated. Cor: PMI nondisplaced. Regular rate & rhythm. No rubs, gallops 3/6 TR murmur  Lungs: Clear Abdomen: Soft, nontender,  nondistended. No hepatosplenomegaly. No bruits or masses. Good bowel sounds. Extremities: No cyanosis, clubbing, rash, edema Neuro: Alert & orientedx3, cranial nerves grossly intact. moves all 4 extremities w/o difficulty. Affect pleasant   Telemetry   NSR 60s   EKG    No new EKG to review   Labs    CBC Recent Labs    03/06/2021 1545  WBC 8.0  NEUTROABS 5.2  HGB 9.9*  HCT 31.6*  MCV 100.3*  PLT [id number]   Basic Metabolic Panel Recent Labs    02/21/2021 1545 02/26/2021 0147  NA 135 134*  K 3.6 3.6  CL 98 97*  CO2 26 28  GLUCOSE 127* 104*  BUN 23* 27*  CREATININE 5.27* 5.75*  CALCIUM 7.8* 8.1*  MG 1.8  --    Liver Function Tests Recent Labs    03/12/2021 1545  AST 16  ALT 10  ALKPHOS 90  BILITOT 1.0  PROT 7.6  ALBUMIN 3.0*   No results for input(s): LIPASE, AMYLASE in the last 72 hours. Cardiac Enzymes No results for input(s): CKTOTAL, CKMB, CKMBINDEX, TROPONINI in the last 72 hours.  BNP: BNP (last 3 results) Recent Labs    03/03/2021 1545  BNP 1,127.8*    ProBNP (last 3 results) No results for input(s): PROBNP in the last 8760 hours.   D-Dimer No results for input(s): DDIMER in the last 72 hours. Hemoglobin A1C Recent Labs  03/12/2021 1545  HGBA1C 6.5*   Fasting Lipid Panel Recent Labs    03/05/2021 0147  CHOL 108  HDL 32*  LDLCALC 66  TRIG 50  CHOLHDL 3.4   Thyroid Function Tests Recent Labs    03/02/2021 1545  TSH 7.983*    Other results:   Imaging    US Abdomen Complete  Result Date: 03/03/2021 CLINICAL DATA:  Cirrhosis.  Ascites. EXAM: ABDOMEN ULTRASOUND COMPLETE COMPARISON:  CT chest report 11/24/2014. FINDINGS: Gallbladder: Gallstones measuring up to 8 mm noted. Gallbladder wall is thickened at 3.5 mm. This could be from hypoproteinemic states. Cholecystitis cannot be excluded. Negative Murphy sign. Common bile duct: Diameter: 4.5 mm Liver: Nodular hepatic parenchymal pattern with increased echogenicity consistent the patient's  known cirrhosis. No focal hepatic abnormality identified. Portal vein is patent on color Doppler imaging with normal direction of blood flow towards the liver. IVC: No abnormality visualized. Pancreas: Visualized portion unremarkable. Spleen: Punctate echogenic foci most likely calcified granulomas. Spleen size normal. Right Kidney: Length: 11.3 cm. Suboptimal visualization due to overlying bowel gas. Increased echogenicity consistent chronic medical renal disease. No focal abnormality identified. No hydronephrosis. Left Kidney: Length: 10.4 cm. Suboptimal visualization due to overlying bowel gas. Increased echogenicity consistent chronic medical renal disease. No focal abnormality identified. No hydronephrosis. Abdominal aorta: No aneurysm visualized. Other findings: Moderate ascites. IMPRESSION: 1. Gallstones measuring up to 8 mm noted. Gallbladder wall is thickened at 3.5 mm. This could be from hypoproteinemic states. Cholecystitis cannot be excluded. Negative Murphy sign. 2. Nodular hepatic parenchymal pattern with increased echogenicity consistent with the patient's known cirrhosis. No focal hepatic abnormality identified. Portal vein is patent with normal direction of flow. 3.  Moderate ascites. Electronically Signed   By: Marcello Moores  Register   On: 03/07/2021 05:11      Medications:     Scheduled Medications: . atorvastatin  40 mg Oral Daily  . enoxaparin (LOVENOX) injection  30 mg Subcutaneous Q24H  . insulin aspart  0-9 Units Subcutaneous TID WC  . metoprolol succinate  50 mg Oral QPM  . mupirocin ointment  1 application Nasal BID  . sodium chloride flush  3 mL Intravenous Q12H  . sodium chloride flush  3 mL Intravenous Q12H     Infusions: . sodium chloride    . sodium chloride    . sodium chloride       PRN Medications:  sodium chloride, sodium chloride, acetaminophen, nitroGLYCERIN, ondansetron (ZOFRAN) IV, sodium chloride flush, sodium chloride flush     Assessment/Plan    1. CAD: Severe 3VD with decreased EF, agree that CABG would be best treatment approach. No chest pain.  - Continue ASA 81.  - Continue atorvastatin 40 mg daily.  - Continue Toprol XL 50 mg daily.  - Plan for CABG Wednesday.  2. Chronic HF with mid range EF: Suspect ischemic cardiomyopathy.  Echo 4/22 with EF 40-45% with moderately decreased RV systolic function and PASP 99, moderate TR. Mild volume overload on exam.  - Continue Toprol XL 50 mg daily.  - Will need HD today  3. ESRD: Suspected due to diabetes.  Notify renal he is here, needs HD today   4. Pulmonary hypertension: Reviewed RHC from 5/22, suspect PH from high cardiac output in the setting of chronic liver disease. With low PVR, portopulmonary hypertension not likely.  Oxygen saturation is 99% on RA, so no evidence for hepatopulmonary syndrome.   - Repeat RHC with shunt run to ensure no left=>right shunt has been missed, but as  above suspect elevated PA pressure is due to high CO from liver disease.  5. Cirrhosis: Noted on abdominal US from 2020.  H/o paracentesis.  Had workup at [facility], viral hepatitis labs negative.  They ended up think that the cirrhosis was cardiogenic.  - repeat abdominal US 5/23 w/ nodular hepatic parenchymal pattern with increased echogenicity consistent with the patient's known cirrhosis. No focal hepatic abnormality identified. Portal vein is patent. Moderate ascites. Gallstones noted.  - LFTs normal. INR 1.3   Length of Stay: 1  Brittainy Simmons, PA-C  03/10/2021, 7:31 AM  Advanced Heart Failure Team Pager [phone] (M-F; 7a - 5p)  Please contact [facility] for night-coverage after hours (5p -7a ) and weekends on [email or web address]  Patient seen with PA, agree with the above note.   RHC done today: RA mean 14 RV 61/15 PA 64/25, mean 41 PCWP mean 16 Oxygen saturations: IVC/low RA 69% PA 72% AO 99% Cardiac Output (Fick) 6.82  Cardiac Index (Fick) 3.29 PVR 3.7 WU  Cardiac Output (Thermo) 6.36   Cardiac Index (Thermo) 3.07 PVR 3.9 WU  Abdominal US showed cirrhosis.  INR 1.3 with albumin 3.0.  Otherwise LFTs normal.   General: NAD Neck: JVP 12 cm, no thyromegaly or thyroid nodule.  Lungs: Clear to auscultation bilaterally with normal respiratory effort. CV: Nondisplaced PMI.  Heart regular S1/S2, no S3/S4, no murmur.  No peripheral edema.   Abdomen: Soft, nontender, no hepatosplenomegaly, no distention.  Skin: Intact without lesions or rashes.  Neurologic: Alert and oriented x 3.  Psych: Normal affect. Extremities: No clubbing or cyanosis.  HEENT: Normal.   RHC showed no left->shunt, moderate mixed pulmonary arterial/pulmonary venous hypertension with PVR 3.9 WU.  The CO was not markedly high.  RA pressure elevated, consistent with need for HD today.  Suspect he may have a component of portopulmonary hypertension.  - Will add sildenafil 20 mg tid.   I reviewed films with Dr. Ellyn Hack, PCI would be possible but would be difficult/high risk with multiple lesions and heavy calcification.  I think that best option is still going to be CABG, will be at higher post-op bleeding risk with cirrhosis.  Will discuss with Dr. Orvan Seen.   Needs HD today.   Loralie Champagne 03/02/2021 1:32 PM

## 2021-03-16 NOTE — Progress Notes (Signed)
Pre-CABG study completed.  ° °Please see CV Proc for preliminary results.  ° °Nalina Yeatman, RDMS, RVT ° °

## 2021-03-16 NOTE — Consult Note (Signed)
[facility] Renal Consultation Note  Requesting MD: Loralie Champagne, MD Indication for Consultation:  ESRD  Chief complaint: for heart surgery  HPI:  David Rose. is a 55 y.o. male with a history of type 2 diabetes, hypertension, end-stage renal disease, peripheral artery disease, chronic systolic heart failure, cirrhosis, and coronary artery disease who presented to the hospital on 02/26/2021.  He had a recent heart catheterization and which demonstrated severe three-vessel disease.  He is planned for CABG tomorrow.  He was admitted to optimize pre-operatively.  Past course unfortunately complicated by steal.  He states he has an AVF and a graft in right arm and they are still using the AVF for now.  Dialyzes at [facility], TTS.  Most he has ever taken off is 4 kg.  Sometimes a few kg sometimes rinsed back b/c under weight.  No shortness of breath or chest pain or nausea or vomiting.  Spoke with outpatient HD unit RN for orders below    PMHx:   Past Medical History:  Diagnosis Date  . ESRD (end stage renal disease) ([place])   . Hypertension   . Peripheral arterial disease ([place])   . Type 2 diabetes mellitus ([place])     Past Surgical History:  Procedure Laterality Date  . AV FISTULA PLACEMENT Right   . PARACENTESIS    . RIGHT/LEFT HEART CATH AND CORONARY ANGIOGRAPHY N/A 02/24/2021   Procedure: RIGHT/LEFT HEART CATH AND CORONARY ANGIOGRAPHY;  Surgeon: Troy Sine, MD;  Location: [place] [facility];  Service: Cardiovascular;  Laterality: N/A;    Family Hx:  Family History  Problem Relation Age of Onset  . Thyroid cancer Mother   . Leukemia Father   no family hx of ESRD  Social History:  reports that he has never smoked. He has never used smokeless tobacco. No history on file for alcohol use and drug use.  Allergies:  Allergies  Allergen Reactions  . Shellfish-Derived Products Anaphylaxis  . Wasp Venom Protein Anaphylaxis  . Penicillins Other (See Comments)     Childhood reaction    Medications: Prior to Admission medications   Medication Sig Start Date End Date Taking? Authorizing Provider  Eluxadoline (VIBERZI) 75 MG TABS Take 75 mg by mouth in the morning and at bedtime.   Yes [provider]  furosemide (LASIX) 80 MG tablet Take 80 mg by mouth daily as needed for fluid. 02/20/20  Yes [provider]  lidocaine-prilocaine (EMLA) cream Apply 1 application topically as needed (before dialysis).   Yes [provider]  metoprolol succinate (TOPROL-XL) 50 MG 24 hr tablet Take 50 mg by mouth every evening. Take with or immediately following a meal.   Yes [provider]  nitroGLYCERIN (NITROSTAT) 0.4 MG SL tablet Place 1 tablet (0.4 mg total) under the tongue every 5 (five) minutes as needed. Patient taking differently: Place 0.4 mg under the tongue every 5 (five) minutes x 3 doses as needed for chest pain. 02/24/21  Yes Reino Bellis B, NP  sevelamer carbonate (RENVELA) 800 MG tablet Take 1,600 mg by mouth 3 (three) times daily with meals.   Yes [provider]    I have reviewed the patient's current and reported prior to admission medications.  Labs:  BMP Latest Ref Rng & Units 02/21/2021 02/24/2021 02/24/2021  Glucose 70 - 99 mg/dL 104(H) 127(H) -  BUN 6 - 20 mg/dL 27(H) 23(H) -  Creatinine 0.61 - 1.24 mg/dL 5.75(H) 5.27(H) -  Sodium 135 - 145  mmol/L 134(L) 135 137  Potassium 3.5 - 5.1 mmol/L 3.6 3.6 4.7  Chloride 98 - 111 mmol/L 97(L) 98 -  CO2 22 - 32 mmol/L 28 26 -  Calcium 8.9 - 10.3 mg/dL 8.1(L) 7.8(L) -   ROS:  Pertinent items noted in HPI and remainder of comprehensive ROS otherwise negative.    Physical Exam: Vitals:   02/27/2021 0300 03/12/2021 0756  BP: 118/60 121/66  Pulse: 65 62  Resp: 14 13  Temp: 97.7 F (36.5 C) 98.1 F (36.7 C)  SpO2: 99% 98%     General: adult male in bed in NAD at rest HEENT: NCAT Eyes: EOMI sclera anicteric Neck: Supple trachea midline Heart: S1S2 no  rub Lungs: clear and unlabored on room air  Abdomen: soft/nt/nd Extremities: no edema lower extremities no cyanosis or clubbing Skin: no rash on extremities exposed  Psych normal mood and affect Neuro: alert and oriented x 3 provides hx and follows commands Access: RUE AVF bruit and thrill; RUE AVG bruit  Outpatient HD rx:   [facility] [phone] Use AVF; graft on inner arm  180 dialyzer 2K/2.5 Ca DF 500 BF 400 4 hours 15 g Heparin loading dose 2000 and hourly dose of 1200 EDW 78.5 kg Last post weight 78 kg on 5/21 (went in at 47 and came out at 78 kg) Meds: epogen 2400 units each tx venofer 50 mg weekly  Not on hectorol or calcitriol sensipar 30 mg three times a week with HD   Assessment/Plan:  # ESRD  - HD today - plans for CABG tomorrow   # CAD - for CABG tomorrow   # Chronic systolic CHF  - heart failure managing  - optimize volume with HD  # Pulmonary HTN  - optimize volume with HD   # Anemia CKD - acceptable pre-op; on ESA outpatient  # Metabolic bone disease  - check phos. Outpatient rx as above.  Defer sensipar today and watch calcium. Not on activated vit D outpatient    Claudia Desanctis 02/23/2021, 9:52 AM

## 2021-03-17 ENCOUNTER — Inpatient Hospital Stay (HOSPITAL_COMMUNITY): Payer: Medicare Other

## 2021-03-17 ENCOUNTER — Encounter (HOSPITAL_COMMUNITY): Admission: RE | Disposition: E | Payer: Self-pay | Source: Ambulatory Visit | Attending: Cardiothoracic Surgery

## 2021-03-17 DIAGNOSIS — R188 Other ascites: Secondary | ICD-10-CM

## 2021-03-17 DIAGNOSIS — K761 Chronic passive congestion of liver: Secondary | ICD-10-CM | POA: Diagnosis not present

## 2021-03-17 DIAGNOSIS — I2510 Atherosclerotic heart disease of native coronary artery without angina pectoris: Secondary | ICD-10-CM

## 2021-03-17 DIAGNOSIS — I5023 Acute on chronic systolic (congestive) heart failure: Secondary | ICD-10-CM | POA: Diagnosis not present

## 2021-03-17 LAB — BASIC METABOLIC PANEL
Anion gap: 8 (ref 5–15)
BUN: 13 mg/dL (ref 6–20)
CO2: 29 mmol/L (ref 22–32)
Calcium: 8 mg/dL — ABNORMAL LOW (ref 8.9–10.3)
Chloride: 98 mmol/L (ref 98–111)
Creatinine, Ser: 3.76 mg/dL — ABNORMAL HIGH (ref 0.61–1.24)
GFR, Estimated: 18 mL/min — ABNORMAL LOW (ref >60)
Glucose, Bld: 92 mg/dL (ref 70–99)
Potassium: 3.7 mmol/L (ref 3.5–5.1)
Sodium: 135 mmol/L (ref 135–145)

## 2021-03-17 LAB — GLUCOSE, CAPILLARY
Glucose-Capillary: 89 mg/dL (ref 70–99)
Glucose-Capillary: 81 mg/dL (ref 70–99)
Glucose-Capillary: 117 mg/dL — ABNORMAL HIGH (ref 70–99)
Glucose-Capillary: 116 mg/dL — ABNORMAL HIGH (ref 70–99)

## 2021-03-17 LAB — PHOSPHORUS: Phosphorus: 4.8 mg/dL — ABNORMAL HIGH (ref 2.5–4.6)

## 2021-03-17 SURGERY — CORONARY ARTERY BYPASS GRAFTING (CABG)
Anesthesia: General | Site: Chest

## 2021-03-17 MED ORDER — SILDENAFIL CITRATE 20 MG PO TABS
20.0000 mg | ORAL_TABLET | Freq: Three times a day (TID) | ORAL | Status: DC
  Administered 2021-03-17 – 2021-03-23 (×16): 20 mg via ORAL
  Filled 2021-03-17 (×17): qty 1

## 2021-03-17 MED ORDER — ASPIRIN EC 81 MG PO TBEC
81.0000 mg | DELAYED_RELEASE_TABLET | Freq: Every day | ORAL | Status: DC
  Administered 2021-03-17 – 2021-03-22 (×5): 81 mg via ORAL
  Filled 2021-03-17 (×5): qty 1

## 2021-03-17 MED ORDER — CHLORHEXIDINE GLUCONATE CLOTH 2 % EX PADS
6.0000 | MEDICATED_PAD | Freq: Every day | CUTANEOUS | Status: DC
  Administered 2021-03-18 – 2021-03-19 (×2): 6 via TOPICAL

## 2021-03-17 MED ORDER — ALBUMIN HUMAN 25 % IV SOLN
50.0000 g | Freq: Once | INTRAVENOUS | Status: DC
  Filled 2021-03-17: qty 200

## 2021-03-17 NOTE — Progress Notes (Signed)
CARDIAC REHAB PHASE I   Preop education completed with pt. Pt with IS at bedside. Pt educated on importance of IS use, walks, and sternal precautions. Pt given in-the-tube sheet along with cardiac surgery booklet. Pt denies questions or concerns. Will continue to follow.  CV:[id number] Rufina Falco, RN BSN 03/11/2021 11:42 AM

## 2021-03-17 NOTE — Progress Notes (Signed)
Patient ID: David Rose., male   DOB: 1966/01/29, 55 y.o.   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  Based on low EF and elevated PA pressure, right and left heart cath was done in 5/22.  This showed severe 3VD, high cardiac output with moderate pulmonary hypertension but low PVR.  He is planned for CABG, but admitted pre-op for optimization.    Subjective:    CP free. No dyspnea. O2 sats 94% on RA.   Sitting up in bed. No complaints. Resting comfortably.   He had HD yesterday.   Abdominal US 5/23 w/ nodular hepatic parenchymal pattern with increased echogenicity consistent with the patient's known cirrhosis. No focal hepatic abnormality identified. Portal vein is patent. Moderate ascites. Gallstones noted.  - LFTs normal. INR 1.3   RHC Procedural Findings: Hemodynamics (mmHg) RA mean 14 RV 61/15 PA 64/25, mean 41 PCWP mean 16 Oxygen saturations: IVC/low RA 69% PA 72% AO 99% Cardiac Output (Fick) 6.82  Cardiac Index (Fick) 3.29 PVR 3.7 WU  Cardiac Output (Thermo) 6.36  Cardiac Index (Thermo) 3.07 PVR 3.9 WU PAPI 2.8  Objective:   Weight Range: 78.7 kg Body mass index is 22.28 kg/m.   Vital Signs:   Temp:  [97.6 F (36.4 C)-98.1 F (36.7 C)] 98.1 F (36.7 C) (05/25 0811) Pulse Rate:  [62-64] 64 (05/25 0811) Resp:  [10-19] 19 (05/25 0811) BP: (98-130)/(47-84) 127/68 (05/25 0811) SpO2:  [94 %-100 %] 94 % (05/25 0811) Weight:  [78.7 kg-81.3 kg] 78.7 kg (05/25 0427) Last BM Date: 03/14/2021  Weight change: Filed Weights   02/28/2021 1410 02/25/2021 1800 03/22/2021 0427  Weight: 81.3 kg 79 kg 78.7 kg    Intake/Output:   Intake/Output Summary (Last 24 hours) at 03/19/2021 1016 Last data  filed at 03/11/2021 0800 Gross per 24 hour  Intake 240 ml  Output 2000 ml  Net -1760 ml      Physical Exam    General: NAD Neck: No JVD, no thyromegaly or thyroid nodule.  Lungs: Clear to auscultation bilaterally with normal respiratory effort. CV: Nondisplaced PMI.  Heart regular S1/S2, no S3/S4, 1/6 HSM LLSB.  No peripheral edema.   Abdomen: Soft, nontender, no hepatosplenomegaly, no distention.  Skin: Intact without lesions or rashes.  Neurologic: Alert and oriented x 3.  Psych: Normal affect. Extremities: No clubbing or cyanosis.  HEENT: Normal.    Telemetry   NSR 60s (personally reviewed)  EKG    No new EKG to review   Labs    CBC Recent Labs    02/27/2021 1545 03/05/2021 1258 03/12/2021 1259 03/18/2021 1430  WBC 8.0  --   --  5.9  NEUTROABS 5.2  --   --   --   HGB 9.9*   < > 10.5* 9.5*  HCT 31.6*   < > 31.0* 30.3*  MCV 100.3*  --   --  101.7*  PLT 169  --   --  165   < > = values in this interval not displayed.   Basic Metabolic Panel Recent Labs    03/22/2021 1545 03/14/2021 0147 03/01/2021 1258 02/24/2021 1259 03/21/2021 1430 03/22/2021 0058  NA 135 134*   < >  138  --  135  K 3.6 3.6   < > 3.9  --  3.7  CL 98 97*  --   --   --  98  CO2 26 28  --   --   --  29  GLUCOSE 127* 104*  --   --   --  92  BUN 23* 27*  --   --   --  13  CREATININE 5.27* 5.75*  --   --  5.84* 3.76*  CALCIUM 7.8* 8.1*  --   --   --  8.0*  MG 1.8  --   --   --   --   --   PHOS  --   --   --   --   --  4.8*   < > = values in this interval not displayed.   Liver Function Tests Recent Labs    03/21/2021 1545  AST 16  ALT 10  ALKPHOS 90  BILITOT 1.0  PROT 7.6  ALBUMIN 3.0*   No results for input(s): LIPASE, AMYLASE in the last 72 hours. Cardiac Enzymes No results for input(s): CKTOTAL, CKMB, CKMBINDEX, TROPONINI in the last 72 hours.  BNP: BNP (last 3 results) Recent Labs    02/21/2021 1545  BNP 1,127.8*    ProBNP (last 3 results) No results for input(s): PROBNP in the last  8760 hours.   D-Dimer No results for input(s): DDIMER in the last 72 hours. Hemoglobin A1C Recent Labs    02/26/2021 1545  HGBA1C 6.5*   Fasting Lipid Panel Recent Labs    03/10/2021 0147  CHOL 108  HDL 32*  LDLCALC 66  TRIG 50  CHOLHDL 3.4   Thyroid Function Tests Recent Labs    03/14/2021 1545  TSH 7.983*    Other results:   Imaging    CARDIAC CATHETERIZATION  Result Date: 03/11/2021 1. No evidence for left to right shunt. 2. Mildly elevated PCWP, moderately elevated RA pressure. 3. 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 4. Preserved (but not markedly high) cardiac output. 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 Would be reasonable to add sildenafil 20 mg tid.  Needs fluid removal, due for HD today.   VAS US DOPPLER PRE CABG  Result Date: 03/07/2021 PREOPERATIVE VASCULAR EVALUATION Patient Name:  David Rose.  Date of Exam:   03/14/2021 Medical Rec #: [id number]            Accession #:    [id number] Date of Birth: 01/03/1966             Patient Gender: M Patient Age:   89Y Exam Location:  [facility] Procedure:      VAS US DOPPLER PRE CABG Referring Phys: [id number] AMY D CLEGG --------------------------------------------------------------------------------  Indications:      Pre-CABG. Risk Factors:     Hypertension, Diabetes, coronary artery disease, PAD. Other Factors:    CHF. Comparison Study: No prior studies. Performing Technologist: Darlin Coco RDMS RVT  Examination Guidelines: A complete evaluation includes B-mode imaging, spectral Doppler, color Doppler, and power Doppler as needed of all accessible portions of each vessel. Bilateral testing is considered an integral part of a complete examination. Limited examinations for reoccurring indications may be performed as noted.  Right Carotid Findings: +----------+-------+-------+--------+---------------------------------+--------+            PSV    EDV    StenosisDescribe  Comments           cm/s   cm/s                                                     +----------+-------+-------+--------+---------------------------------+--------+ CCA Prox  94     13                                                       +----------+-------+-------+--------+---------------------------------+--------+ CCA Mid   85     17             focal, hyperechoic and smooth             +----------+-------+-------+--------+---------------------------------+--------+ CCA Distal76     21                                                       +----------+-------+-------+--------+---------------------------------+--------+ ICA Prox  66     17     1-39%   irregular, calcific and                                                   heterogenous                              +----------+-------+-------+--------+---------------------------------+--------+ ICA Distal106    35                                                       +----------+-------+-------+--------+---------------------------------+--------+ ECA       160                   heterogenous and irregular                +----------+-------+-------+--------+---------------------------------+--------+ Portions of this table do not appear on this page. +----------+--------+-------+--------+------------+           PSV cm/sEDV cmsDescribeArm Pressure +----------+--------+-------+--------+------------+ Subclavian190            WNL, AVF             +----------+--------+-------+--------+------------+ +---------+--------+--+--------+--+---------+ VertebralPSV cm/s58EDV cm/s17Antegrade +---------+--------+--+--------+--+---------+ Left Carotid Findings: +----------+-------+-------+--------+---------------------------------+--------+           PSV    EDV    StenosisDescribe                         Comments           cm/s   cm/s                                                      +----------+-------+-------+--------+---------------------------------+--------+  CCA Prox  107    15                                                       +----------+-------+-------+--------+---------------------------------+--------+ CCA Distal89     13                                                       +----------+-------+-------+--------+---------------------------------+--------+ ICA Prox  81     19     1-39%   calcific and heterogenous                 +----------+-------+-------+--------+---------------------------------+--------+ ICA Distal86     27                                                       +----------+-------+-------+--------+---------------------------------+--------+ ECA       102                   irregular, calcific and                                                   heterogenous                              +----------+-------+-------+--------+---------------------------------+--------+ +----------+--------+--------+----------------+------------+ SubclavianPSV cm/sEDV cm/sDescribe        Arm Pressure +----------+--------+--------+----------------+------------+           146             Multiphasic, WNL             +----------+--------+--------+----------------+------------+ +---------+--------+--+--------+--+---------+ VertebralPSV cm/s51EDV cm/s15Antegrade +---------+--------+--+--------+--+---------+  ABI Findings: +---------+------------------+-----+----------+--------+ Right    Rt Pressure (mmHg)IndexWaveform  Comment  +---------+------------------+-----+----------+--------+ Brachial                        triphasic AVF      +---------+------------------+-----+----------+--------+ PTA      255               2.01 monophasic         +---------+------------------+-----+----------+--------+ DP       255               2.01 biphasic            +---------+------------------+-----+----------+--------+ Great Toe83                0.65 Abnormal           +---------+------------------+-----+----------+--------+ +---------+------------------+-----+---------+-------+ Left     Lt Pressure (mmHg)IndexWaveform Comment +---------+------------------+-----+---------+-------+ Brachial 127                    triphasic        +---------+------------------+-----+---------+-------+ PTA      255               2.01 biphasic         +---------+------------------+-----+---------+-------+  DP       255               2.01 biphasic         +---------+------------------+-----+---------+-------+ Great Toe97                0.76 Normal           +---------+------------------+-----+---------+-------+ +-------+---------------+----------------+ ABI/TBIToday's ABI/TBIPrevious ABI/TBI +-------+---------------+----------------+ Right  [place]/0.65                         +-------+---------------+----------------+ Left   [place]/0.76                         +-------+---------------+----------------+  Right Doppler Findings: +--------+--------+-----+---------+--------+ Site    PressureIndexDoppler  Comments +--------+--------+-----+---------+--------+ Brachial             triphasicAVF      +--------+--------+-----+---------+--------+ Radial               triphasic         +--------+--------+-----+---------+--------+ Ulnar                triphasic         +--------+--------+-----+---------+--------+  Left Doppler Findings: +--------+--------+-----+---------+--------+ Site    PressureIndexDoppler  Comments +--------+--------+-----+---------+--------+ [id number]          triphasic         +--------+--------+-----+---------+--------+ Radial               triphasic         +--------+--------+-----+---------+--------+ Ulnar                triphasic         +--------+--------+-----+---------+--------+  Summary:  Right Carotid: Velocities in the right ICA are consistent with a 1-39% stenosis.                The ECA appears <50% stenosed. Left Carotid: Velocities in the left ICA are consistent with a 1-39% stenosis.               The ECA appears <50% stenosed. Vertebrals:  Bilateral vertebral arteries demonstrate antegrade flow. Subclavians: Normal flow hemodynamics were seen in bilateral subclavian              arteries. Right ABI: Resting right ankle-brachial index indicates noncompressible right lower extremity arteries. The right toe-brachial index is abnormal. ABIs are unreliable. RT great toe pressure = 83 mmHg. Left ABI: Resting left ankle-brachial index indicates noncompressible left lower extremity arteries. The left toe-brachial index is normal. ABIs are unreliable. LT Great toe pressure = 97 mmHg. Right Upper Extremity: Doppler waveforms remain within normal limits with right radial compression. Doppler waveforms decrease <50% with right ulnar compression. Left Upper Extremity: Doppler waveforms remain within normal limits with left radial compression. Doppler waveforms remain within normal limits with left ulnar compression.  Electronically signed by Deitra Mayo MD on 03/01/2021 at 2:10:52 PM.    Final      Medications:     Scheduled Medications: . aspirin EC  81 mg Oral Daily  . atorvastatin  40 mg Oral Daily  . Chlorhexidine Gluconate Cloth  6 each Topical Q0600  . enoxaparin (LOVENOX) injection  30 mg Subcutaneous Q24H  . insulin aspart  0-9 Units Subcutaneous TID WC  . metoprolol succinate  50 mg Oral QPM  . mupirocin ointment  1 application Nasal BID  . sildenafil  20 mg Oral TID  . sodium chloride flush  3 mL  Intravenous Q12H  . sodium chloride flush  3 mL Intravenous Q12H  . sodium chloride flush  3 mL Intravenous Q12H    Infusions: . sodium chloride    . sodium chloride      PRN Medications: sodium chloride, sodium chloride, acetaminophen, nitroGLYCERIN, ondansetron  (ZOFRAN) IV, sodium chloride flush, sodium chloride flush     Assessment/Plan   1. CAD: Severe 3VD with decreased EF.  I reviewed films with Dr. Ellyn Hack, PCI would be possible but would be difficult/high risk with multiple lesions and heavy calcification.  I think that best option is still going to be CABG, will be at higher post-op bleeding risk with cirrhosis.  Moderate RV dysfunction by echo is also concerning (with high RA pressure on RHC), but PAPI adequate at 2.8. No chest pain.  - Continue ASA 81.  - Continue atorvastatin 40 mg daily.  - Continue Toprol XL 50 mg daily.  - Ongoing discussions re: CABG risk and timing of procedure.  2. Acute/chronic HF with mid range EF: Suspect ischemic cardiomyopathy.  Echo 4/22 with EF 40-45% with moderately decreased RV systolic function and PASP 99, moderate TR. There is a prominent component of RV failure with RA pressure elevated out of proportion to PCWP (CVP/PCWP 0.875 on RHC) but PAPI adequate at 2.8. JVP remains elevated on exam, may be helpful to lower dry weight some with HD.  - Continue Toprol XL 50 mg daily.  - ?Lower dry weight with ongoing volume overload/RV failure if renal thinks he can tolerate.  3. ESRD: Suspected due to diabetes.  HD TTS.  4. 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  The CO was not markedly high.  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 Oxygen saturation is 99% on RA, so no evidence for hepatopulmonary syndrome.   - Sildenafil 20 mg tid started.   5. Cirrhosis: Noted on abdominal US from 2020.  H/o paracentesis.  Had workup at [facility] (though patient does not remember this), viral hepatitis labs negative.  They ended up think that the cirrhosis was cardiogenic.  Repeat abdominal US 5/23 w/ nodular hepatic parenchymal pattern with increased echogenicity consistent with the patient's known cirrhosis. No focal  hepatic abnormality identified. Portal vein is patent. Moderate ascites. Gallstones noted. LFTs normal. INR 1.3.  Suspect this will increase risk of surgical bleeding with CABG.  - Will ask GI to assess cirrhosis.   Length of Stay: 2  Loralie Champagne, MD  02/25/2021, 10:16 AM  Advanced Heart Failure Team Pager [phone] (M-F; 7a - 5p)  Please contact [facility] for night-coverage after hours (5p -7a ) and weekends on [email or web address]

## 2021-03-17 NOTE — Consult Note (Addendum)
[facility] Consult: 10:45 AM 02/24/2021  LOS: 2 days    Referring Provider: Dr. Aundra Dubin.   Primary Care Physician:  Ollen Bowl, MD Primary Gastroenterologist:  Theodosia Blender In [place].    Reason for Consultation: Surgical risk stratification for patient with cirrhosis.   HPI: Masao Fallah. is a 55 y.o. male.  PMH type 2 DM.  PAD.  Diabetic nephropathy/ Invokana renal injury.  ESRD.  HD MWF.  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  CHF.  Baseline LVEF 40 to 45%.  IBS-C.  Cirrhosis, ascites noted on ultrasound as far back as 05/2019.  Paracentesis 05/2019, no SBP.  HCV, Hep B surf Ag negative, Hep B surf Ab reactive, Hep A Ab total negative 11/2020.  IDA, receives periodic iron infusions during HD.    04/2019 Colonoscopy.  In [place], [place].  Theodosia Blender MD.  Screening study as well as evaluation of diarrhea, bloating. 2 cm polyp removed from desc colon.  O/w normal study.  However poor prep led to s/o repeating study in 1 year.  Unable to locate path report on polyp.  02/24/2021 cardiac cath with severe three-vessel disease, high cardiac output, moderate pulmonary hypertension.  CABG planned, admitted for preop optimization. 03/14/21 Cardiac cath. severe three-vessel disease, high cardiac output, mixed pulm arterial/ venous htn, low PVR.  CABG planned, admitted for preop optimization.  03/01/2021 abdominal ultrasound showed nodular, cirrhotic liver.  Moderate ascites.  Gallstones.  Despite findings in summer 2020 and notation of cirrhosis and ascites by his MD then, patient unaware of a diagnosis of cirrhosis or of any problems with his liver. No hx melena, tarry stools.  Occasional loose stools breakthrough despite  Viberzi.  Viberzi mostly helps diarrhea and intestinal bloating. Good appetite.  No heartburn ofr dysphagia. Gradual wt loss over some years from 240 >> 173#.    Family history.  Possible rheumatoid arthritis in his mother.  No other autoimmune disorders.  No colorectal or gastric cancers.  No liver disease.  Social history.  Lives in [place].  No alcohol consumption.  No history of significant alcohol consumption.    Past Medical History:  Diagnosis Date  . ESRD (end stage renal disease) ([place])   . Hypertension   . Peripheral arterial disease ([place])   . Type 2 diabetes mellitus ([place])     Past Surgical History:  Procedure Laterality Date  . AV FISTULA PLACEMENT Right   . PARACENTESIS    . RIGHT HEART CATH N/A 03/07/2021   Procedure: RIGHT HEART CATH;  Surgeon: Larey Dresser, MD;  Location: [facility];  Service: Cardiovascular;  Laterality: N/A;  . RIGHT/LEFT HEART CATH AND CORONARY ANGIOGRAPHY N/A 02/24/2021   Procedure: RIGHT/LEFT HEART CATH AND CORONARY ANGIOGRAPHY;  Surgeon: Troy Sine, MD;  Location: [facility];  Service: Cardiovascular;  Laterality: N/A;    Prior to Admission medications   Medication Sig Start Date End Date Taking? Authorizing Provider  Eluxadoline (VIBERZI) 75 MG TABS Take 75 mg by  mouth in the morning and at bedtime.   Yes [provider]  furosemide (LASIX) 80 MG tablet Take 80 mg by mouth daily as needed for fluid. 02/20/20  Yes [provider]  lidocaine-prilocaine (EMLA) cream Apply 1 application topically as needed (before dialysis).   Yes [provider]  metoprolol succinate (TOPROL-XL) 50 MG 24 hr tablet Take 50 mg by mouth every evening. Take with or immediately following a meal.   Yes [provider]  nitroGLYCERIN (NITROSTAT) 0.4 MG SL tablet Place 1 tablet (0.4 mg total) under the tongue every 5 (five) minutes as needed. Patient taking differently: Place 0.4 mg under the tongue every 5  (five) minutes x 3 doses as needed for chest pain. 02/24/21  Yes Reino Bellis B, NP  sevelamer carbonate (RENVELA) 800 MG tablet Take 1,600 mg by mouth 3 (three) times daily with meals.   Yes [provider]    Scheduled Meds: . aspirin EC  81 mg Oral Daily  . atorvastatin  40 mg Oral Daily  . Chlorhexidine Gluconate Cloth  6 each Topical Q0600  . enoxaparin (LOVENOX) injection  30 mg Subcutaneous Q24H  . insulin aspart  0-9 Units Subcutaneous TID WC  . metoprolol succinate  50 mg Oral QPM  . mupirocin ointment  1 application Nasal BID  . sildenafil  20 mg Oral TID  . sodium chloride flush  3 mL Intravenous Q12H  . sodium chloride flush  3 mL Intravenous Q12H  . sodium chloride flush  3 mL Intravenous Q12H   Infusions: . sodium chloride    . sodium chloride     PRN Meds: sodium chloride, sodium chloride, acetaminophen, nitroGLYCERIN, ondansetron (ZOFRAN) IV, sodium chloride flush, sodium chloride flush   Allergies as of 03/01/2021 - Review Complete 03/01/2021  Allergen Reaction Noted  . Shellfish-derived products Anaphylaxis 02/28/2019  . Wasp venom protein Anaphylaxis 02/28/2019  . Penicillins Other (See Comments) 06/16/2014    Family History  Problem Relation Age of Onset  . Thyroid cancer Mother   . Leukemia Father     Social History   Socioeconomic History  . Marital status: Divorced    Spouse name: Not on file  . Number of children: Not on file  . Years of education: Not on file  . Highest education level: Not on file  Occupational History  . Not on file  Tobacco Use  . Smoking status: Never Smoker  . Smokeless tobacco: Never Used  Substance and Sexual Activity  . Alcohol use: Not on file  . Drug use: Not on file  . Sexual activity: Not on file  Other Topics Concern  . Not on file  Social History Narrative   Pt has 1 year of college.    Social Determinants of Health   Financial Resource Strain: Not on file  Food Insecurity: Not on file   Transportation Needs: Not on file  Physical Activity: Not on file  Stress: Not on file  Social Connections: Not on file  Intimate Partner Violence: Not on file    REVIEW OF SYSTEMS: Constitutional: No profound weakness or fatigue. ENT:  No nose bleeds Pulm: No shortness of breath. CV:  No palpitations.  No angina.  Infrequent lower extremity edema. GU: Not oliguric.  No hematuria, no frequency GI: See HPI Heme: No unusual bleeding or bruising. Transfusions: None Neuro:  No headaches, no peripheral tingling or numbness.  No syncope, no seizures. Derm:  No itching, no rash or sores.  Endocrine:  No  sweats or chills.  No polyuria or dysuria Immunization: Not queried, reviewed. Travel:  None beyond local counties in last few months.    PHYSICAL EXAM: Vital signs in last 24 hours: Vitals:   03/13/2021 0300 03/03/2021 0811  BP: 109/60 127/68  Pulse:  64  Resp: 10 19  Temp: 97.9 F (36.6 C) 98.1 F (36.7 C)  SpO2: 94% 94%   Wt Readings from Last 3 Encounters:  03/18/2021 78.7 kg  03/01/21 83 kg  02/24/21 80 kg    General: Patient is thin, pale, looks somewhat chronically ill but not acutely ill.  Sitting up in bed comfortable. Head: Facial asymmetry or swelling.  No signs of head trauma. Eyes: No conjunctival pallor.  No scleral icterus.  EOMI Ears: Not hard of hearing Nose: No congestion or discharge. Mouth: Moist, pink, clear mucosa.  Tongue midline.  Fair dentition. Neck: No JVD, no masses, no thyromegaly Lungs: Diminished breath sounds but clear bilaterally.  No labored breathing.  No cough Heart: RRR.  No MRG.  S1, S2 present Abdomen: Soft, nondistended, nontender.  No HSM, masses, bruits.  Slight umbilical hernia. Rectal: Deferred Musc/Skeltl: No joint redness, swelling or gross deformity. Extremities: No CCE. Neurologic: Alert.  Oriented x3.  Moves all 4 limbs without gross weakness.  No tremors. Skin: Pale.  No obvious telangiectasia.  No sores, suspicious lesions,  rashes. Nodes: No cervical adenopathy Psych: Calm, pleasant, cooperative, fluid speech  Intake/Output from previous day: 05/24 0701 - 05/25 0700 In: 0  Out: 2000  Intake/Output this shift: Total I/O In: 240 [P.O.:240] Out: -   LAB RESULTS: Recent Labs    03/08/2021 1545 02/28/2021 1258 02/22/2021 1259 03/06/2021 1430  WBC 8.0  --   --  5.9  HGB 9.9* 10.2* 10.5* 9.5*  HCT 31.6* 30.0* 31.0* 30.3*  PLT 169  --   --  165   BMET Lab Results  Component Value Date   NA 135 03/18/2021   NA 138 02/22/2021   NA 138 02/24/2021   K 3.7 03/09/2021   K 3.9 03/12/2021   K 3.9 03/02/2021   CL 98 03/03/2021   CL 97 (L) 02/22/2021   CL 98 02/26/2021   CO2 29 03/14/2021   CO2 28 03/01/2021   CO2 26 03/02/2021   GLUCOSE 92 03/18/2021   GLUCOSE 104 (H) 03/03/2021   GLUCOSE 127 (H) 02/22/2021   BUN 13 03/20/2021   BUN 27 (H) 02/23/2021   BUN 23 (H) 03/19/2021   CREATININE 3.76 (H) 03/13/2021   CREATININE 5.84 (H) 03/13/2021   CREATININE 5.75 (H) 03/04/2021   CALCIUM 8.0 (L) 03/21/2021   CALCIUM 8.1 (L) 02/23/2021   CALCIUM 7.8 (L) 02/25/2021   LFT Recent Labs    02/22/2021 1545  PROT 7.6  ALBUMIN 3.0*  AST 16  ALT 10  ALKPHOS 90  BILITOT 1.0   PT/INR Lab Results  Component Value Date   INR 1.3 (H) 02/22/2021   Hepatitis Panel Recent Labs    03/21/2021 1459  HEPBSAG NON REACTIVE   C-Diff No components found for: CDIFF Lipase  No results found for: LIPASE  Drugs of Abuse  No results found for: LABOPIA, COCAINSCRNUR, LABBENZ, AMPHETMU, THCU, LABBARB   RADIOLOGY STUDIES: US Abdomen Complete  Result Date: 02/22/2021 CLINICAL DATA:  Cirrhosis.  Ascites. EXAM: ABDOMEN ULTRASOUND COMPLETE COMPARISON:  CT chest report 11/24/2014. FINDINGS: Gallbladder: Gallstones measuring up to 8 mm noted. Gallbladder wall is thickened at 3.5 mm. This could be from hypoproteinemic states. Cholecystitis cannot be  excluded. Negative Murphy sign. Common bile duct: Diameter: 4.5 mm Liver:  Nodular hepatic parenchymal pattern with increased echogenicity consistent the patient's known cirrhosis. No focal hepatic abnormality identified. Portal vein is patent on color Doppler imaging with normal direction of blood flow towards the liver. IVC: No abnormality visualized. Pancreas: Visualized portion unremarkable. Spleen: Punctate echogenic foci most likely calcified granulomas. Spleen size normal. Right Kidney: Length: 11.3 cm. Suboptimal visualization due to overlying bowel gas. Increased echogenicity consistent chronic medical renal disease. No focal abnormality identified. No hydronephrosis. Left Kidney: Length: 10.4 cm. Suboptimal visualization due to overlying bowel gas. Increased echogenicity consistent chronic medical renal disease. No focal abnormality identified. No hydronephrosis. Abdominal aorta: No aneurysm visualized. Other findings: Moderate ascites. IMPRESSION: 1. Gallstones measuring up to 8 mm noted. Gallbladder wall is thickened at 3.5 mm. This could be from hypoproteinemic states. Cholecystitis cannot be excluded. Negative Murphy sign. 2. Nodular hepatic parenchymal pattern with increased echogenicity consistent with the patient's known cirrhosis. No focal hepatic abnormality identified. Portal vein is patent with normal direction of flow. 3.  Moderate ascites. Electronically Signed   By: Marcello Moores  Register   On: 03/12/2021 05:11   CARDIAC CATHETERIZATION  Result Date: 03/18/2021 1. No evidence for left to right shunt. 2. Mildly elevated PCWP, moderately elevated RA pressure. 3. 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 4. Preserved (but not markedly high) cardiac output. 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 Would be reasonable to add sildenafil 20 mg tid.  Needs fluid removal, due for HD today.   VAS US DOPPLER PRE CABG  Result Date: 03/18/2021 Summary: Right Carotid: Velocities in the right ICA are  consistent with a 1-39% stenosis.  The ECA appears <50% stenosed. Left Carotid: Velocities in the left ICA are consistent with a 1-39% stenosis. The ECA appears <50% stenosed. Vertebrals:  Bilateral vertebral arteries demonstrate antegrade flow. Subclavians: Normal flow hemodynamics were seen in bilateral subclavian arteries. Right ABI: Resting right ankle-brachial index indicates noncompressible right lower extremity arteries. The right toe-brachial index is abnormal. ABIs are unreliable. RT great toe pressure = 83 mmHg. Left ABI: Resting left ankle-brachial index indicates noncompressible left lower extremity arteries. The left toe-brachial index is normal. ABIs are unreliable. LT Great toe pressure = 97 mmHg. Right Upper Extremity: Doppler waveforms remain within normal limits with right radial compression. Doppler waveforms decrease <50% with right ulnar compression. Left Upper Extremity: Doppler waveforms remain within normal limits with left radial compression. Doppler waveforms remain within normal limits with left ulnar compression.  Electronically signed by Deitra Mayo MD on 03/09/2021 at 2:10:52 PM.    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  Although patient was not aware, he had this dx as far back as summer 2020.  Suspect this is cardiac related.  Hep ABC w Hep B s Ab + (pt thinks he may have received Hep B vax).    *   Ascites.  No SBP on paracentesis in 2020.  Moderate ascites per current ultrasound.  *    three-vessel coronary disease.  Needs CABG.  *    ESRD.  On MWF HD.  *   IDDM.     *    Macrocytic anemia.  *    2 mm colon polyp (path unknown) at 04/2019 colonoscopy marked by poor prep, no subsequent colonoscopy at 1 year.    *  IBS-C.  Mostly controlled w Viberzi.      PLAN:     *    Pursue paracentesis with fluid studies to rule out SBP?  *   Anemia profile w B12, Folate.  AFP level.    *    EGD to screen for varices.     Azucena Freed  02/21/2021, 10:45 AM Phone [phone]  I have reviewed the entire case in detail with the above APP and discussed the plan in detail.  Therefore, I agree with the diagnoses recorded above. In addition,  I have personally interviewed and examined the patient and have personally reviewed any abdominal/pelvic CT scan images.  My additional thoughts are as follows:  55 year old man with multiple medical issues and cirrhosis diagnosed in 2020 with evaluation at [facility] hepatology.  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  He had not followed up with them since then, the patient was unaware that the diagnosis was cirrhosis.  He seems to have limited health literacy.  He has multivessel CAD and CHF and is being worked up for CABG. Preop risk and optimization assessment requested from our service.  His MELD score cannot be accurately calculated because he is on dialysis. He has a platelet count that is low normal and mild coagulopathy with INR 1.3.  This increases his perioperative bleeding risk to some degree, but to what extent is difficult to quantify.  I do not feel that he needs prophylactic FFP or platelet transfusion before CABG.  Blood products may be necessary for perioperative or postoperative bleeding.  He has a moderate amount of ascites on exam and imaging.  Is not currently causing distention, nausea or respiratory compromise.  However, I recommend that he have a preoperative therapeutic paracentesis of a maximum of 4 L, sending the specimen for cell count and albumin. This way we are ahead of the ascites because he will most likely develop some postoperatively from fluids, medicines and any blood products that he may need.  Other than that, he will need posthospitalization follow-up for long-term monitoring of his cirrhosis, and he reports having a GI physician in [place] that has  been managing some chronic diarrhea and has previously done endoscopic procedures for him.  He will certainly need an eventual upper endoscopy to screen for varices, but I do not feel it is necessary before CABG.  CT scan of the chest from today does not show any apparent esophageal or proximal gastric varices.  I discussed this with Dr. Aundra Dubin.  Orders were placed for the paracentesis, and we will be glad to see this patient again as needed during hospitalization if called by primary service.  Nelida Meuse III Office:[phone]

## 2021-03-17 NOTE — Consult Note (Signed)
[address]       [name] [address]             [place]. [place] Record [id number] Date of Birth: [birth year]  Referring: No ref. provider found Primary Care: Ollen Bowl, MD Primary Cardiologist:Branch, Roderic Palau, MD  Chief Complaint:   No chief complaint on file. heart failure History of Present Illness:     55 year old man is seen for consideration of CABG.  He was originally seen in the outpatient setting with a recent diagnosis of multivessel coronary artery disease and biventricular dysfunction.  His left ventricular function is in the 40 to 45% range.  He also has a several year history of end-stage renal disease and is on hemodialysis.  This has been complicated by steal in the right upper extremity related to his AV fistula.  He also carries a diagnosis of hepatic cirrhosis.  Patient had previously undergone evaluation for renal transplant at [facility].  He is admitted for further work-up in anticipation of potential CABG. He has been tolerating hemodialysis since being admitted.  He underwent right heart catheterization and heart failure evaluation by Dr. Aundra Dubin to further assess right heart failure.  There was no evidence for left to right shunt.  He had a mildly elevated wedge pressure and moderately elevated RA pressure.  According to nephrology, he is optimized from a volume standpoint   Current Activity/ Functional Status: Patient will be independent with mobility/ambulation, transfers, ADL's, IADL's.   Zubrod Score: At the time of surgery this patient's most appropriate activity status/level should be described as: '[]'$     0    Normal activity, no symptoms '[x]'$     1    Restricted in physical strenuous activity but ambulatory, able to do out light work '[]'$     2    Ambulatory and capable of self care, unable to do work activities, up and about                 more than 50%  Of the time                             '[]'$     3    Only limited self care, in bed greater than 50% of waking hours '[]'$     4    Completely disabled, no self care, confined to bed or chair '[]'$     5    Moribund  Past Medical History:  Diagnosis Date  . ESRD (end stage renal disease) ([place])   . Hypertension   . Peripheral arterial disease ([place])   . Type 2 diabetes mellitus ([place])     Past Surgical History:  Procedure Laterality Date  . AV FISTULA PLACEMENT Right   . PARACENTESIS    . RIGHT HEART CATH N/A 03/10/2021   Procedure: RIGHT HEART CATH;  Surgeon: Larey Dresser, MD;  Location: [facility];  Service: Cardiovascular;  Laterality: N/A;  . RIGHT/LEFT HEART CATH AND CORONARY ANGIOGRAPHY N/A 02/24/2021   Procedure: RIGHT/LEFT HEART CATH AND CORONARY ANGIOGRAPHY;  Surgeon: Troy Sine, MD;  Location: [facility];  Service: Cardiovascular;  Laterality: N/A;    Social History   Tobacco Use  Smoking Status Never Smoker  Smokeless Tobacco Never Used    Social History   Substance and Sexual Activity  Alcohol Use None  Allergies  Allergen Reactions  . Shellfish-Derived Products Anaphylaxis  . Wasp Venom Protein Anaphylaxis  . Penicillins Other (See Comments)    Childhood reaction    Current Facility-Administered Medications  Medication Dose Route Frequency Provider Last Rate Last Admin  . 0.9 %  sodium chloride infusion  250 mL Intravenous PRN Clegg, Amy D, NP      . 0.9 %  sodium chloride infusion  250 mL Intravenous PRN Larey Dresser, MD      . acetaminophen (TYLENOL) tablet 650 mg  650 mg Oral Q4H PRN Clegg, Amy D, NP      . atorvastatin (LIPITOR) tablet 40 mg  40 mg Oral Daily Clegg, Amy D, NP   40 mg at 03/04/2021 1909  . Chlorhexidine Gluconate Cloth 2 % PADS 6 each  6 each Topical Q0600 Claudia Desanctis, MD   6 each at 03/12/2021 [phone]  . enoxaparin (LOVENOX) injection 30 mg  30 mg Subcutaneous Q24H Clegg, Amy D, NP   30 mg at 03/12/2021 2119  . insulin aspart (novoLOG) injection 0-9 Units  0-9  Units Subcutaneous TID WC Clegg, Amy D, NP      . metoprolol succinate (TOPROL-XL) 24 hr tablet 50 mg  50 mg Oral QPM Clegg, Amy D, NP   50 mg at 02/23/2021 1859  . mupirocin ointment (BACTROBAN) 2 % 1 application  1 application Nasal BID Clegg, Amy D, NP   1 application at [id number] 2119  . nitroGLYCERIN (NITROSTAT) SL tablet 0.4 mg  0.4 mg Sublingual Q5 min PRN Clegg, Amy D, NP      . ondansetron (ZOFRAN) injection 4 mg  4 mg Intravenous Q6H PRN Clegg, Amy D, NP      . sildenafil (REVATIO) tablet 20 mg  20 mg Oral TID Larey Dresser, MD      . sodium chloride flush (NS) 0.9 % injection 3 mL  3 mL Intravenous Q12H Clegg, Amy D, NP      . sodium chloride flush (NS) 0.9 % injection 3 mL  3 mL Intravenous PRN Clegg, Amy D, NP      . sodium chloride flush (NS) 0.9 % injection 3 mL  3 mL Intravenous Q12H Clegg, Amy D, NP   3 mL at 03/20/2021 2126  . sodium chloride flush (NS) 0.9 % injection 3 mL  3 mL Intravenous Q12H Larey Dresser, MD      . sodium chloride flush (NS) 0.9 % injection 3 mL  3 mL Intravenous PRN Larey Dresser, MD        Facility-Administered Medications Prior to Admission  Medication Dose Route Frequency Provider Last Rate Last Admin  . sodium chloride flush (NS) 0.9 % injection 3 mL  3 mL Intravenous Q12H Arnoldo Lenis, MD       Medications Prior to Admission  Medication Sig Dispense Refill Last Dose  . Eluxadoline (VIBERZI) 75 MG TABS Take 75 mg by mouth in the morning and at bedtime.     . furosemide (LASIX) 80 MG tablet Take 80 mg by mouth daily as needed for fluid.     Marland Kitchen lidocaine-prilocaine (EMLA) cream Apply 1 application topically as needed (before dialysis).     . metoprolol succinate (TOPROL-XL) 50 MG 24 hr tablet Take 50 mg by mouth every evening. Take with or immediately following a meal.     . nitroGLYCERIN (NITROSTAT) 0.4 MG SL tablet Place 1 tablet (0.4 mg total) under the tongue every 5 (five) minutes as needed. (  Patient taking differently: Place 0.4 mg  under the tongue every 5 (five) minutes x 3 doses as needed for chest pain.) 25 tablet 2   . sevelamer carbonate (RENVELA) 800 MG tablet Take 1,600 mg by mouth 3 (three) times daily with meals.       Family History  Problem Relation Age of Onset  . Thyroid cancer Mother   . Leukemia Father      Review of Systems:   ROS Pertinent items noted in HPI and remainder of comprehensive ROS otherwise negative.  See below    Cardiac Review of Systems: Y or  [    ]= no  Chest Pain [    ]  Resting SOB [   ] Exertional SOB  [  ]  Orthopnea [  ]   Pedal Edema [   ]    Palpitations [  ] Syncope  [  ]   Presyncope [   ]  General Review of Systems: [Y] = yes [  ]=no Constitional: recent weight change [  ]; anorexia [  ]; fatigue [  ]; nausea [  ]; night sweats [  ]; fever [  ]; or chills [  ]                                                               Dental: Last Dentist visit:   Eye : blurred vision [  ]; diplopia [   ]; vision changes [  ];  Amaurosis fugax[  ]; Resp: cough [  ];  wheezing[  ];  hemoptysis[  ]; shortness of breath[  ]; paroxysmal nocturnal dyspnea[  ]; dyspnea on exertion[  ]; or orthopnea[  ];  GI:  gallstones[y ], vomiting[  ];  dysphagia[  ]; melena[  ];  hematochezia [  ]; heartburn[  ];   Hx of  Colonoscopy[  ]; history of frequent diarrhea, cirrhosis GU: kidney stones [  ]; hematuria[  ];   dysuria [  ];  nocturia[  ];  history of     obstruction [  ]; urinary frequency [  ]             Skin: rash, swelling[  ];, hair loss[  ];  peripheral edema[  ];  or itching[  ]; Musculosketetal: myalgias[  ];  joint swelling[  ];  joint erythema[  ];  joint pain[  ];  back pain[  ];  Heme/Lymph: bruising[  ];  bleeding[  ];  anemia[  ];  Neuro: TIA[  ];  headaches[  ];  stroke[  ];  vertigo[  ];  seizures[  ];   paresthesias[  ];  difficulty walking[  ];  Psych:depression[  ]; anxiety[  ];  Endocrine: diabetes[y  ];  thyroid dysfunction[  ];           Physical Exam: BP 109/60 (BP  Location: Left Arm)   Pulse 63   Temp 97.9 F (36.6 C) (Oral)   Resp 10   Wt 78.7 kg   SpO2 94%   BMI 22.28 kg/m    General appearance: alert, cooperative and no distress Neck: no adenopathy, no carotid bruit, supple, symmetrical, trachea midline and thyroid not enlarged, symmetric, no tenderness/mass/nodules Back: no tenderness to percussion or palpation,  symmetric, no curvature. ROM normal. No CVA tenderness. Cardio: regular rate and rhythm, S1, S2 normal, no murmur, click, rub or gallop GI: Fullness,  with umbilical hernia Extremities: Right upper extremity AV fistula with ischemic changes in the right hand Neurologic: Alert and oriented X 3, normal strength and tone. Normal symmetric reflexes. Normal coordination and gait  Diagnostic Studies & Laboratory data:     Recent Radiology Findings:   US Abdomen Complete  Result Date: 03/04/2021 CLINICAL DATA:  Cirrhosis.  Ascites. EXAM: ABDOMEN ULTRASOUND COMPLETE COMPARISON:  CT chest report 11/24/2014. FINDINGS: Gallbladder: Gallstones measuring up to 8 mm noted. Gallbladder wall is thickened at 3.5 mm. This could be from hypoproteinemic states. Cholecystitis cannot be excluded. Negative Murphy sign. Common bile duct: Diameter: 4.5 mm Liver: Nodular hepatic parenchymal pattern with increased echogenicity consistent the patient's known cirrhosis. No focal hepatic abnormality identified. Portal vein is patent on color Doppler imaging with normal direction of blood flow towards the liver. IVC: No abnormality visualized. Pancreas: Visualized portion unremarkable. Spleen: Punctate echogenic foci most likely calcified granulomas. Spleen size normal. Right Kidney: Length: 11.3 cm. Suboptimal visualization due to overlying bowel gas. Increased echogenicity consistent chronic medical renal disease. No focal abnormality identified. No hydronephrosis. Left Kidney: Length: 10.4 cm. Suboptimal visualization due to overlying bowel gas. Increased echogenicity  consistent chronic medical renal disease. No focal abnormality identified. No hydronephrosis. Abdominal aorta: No aneurysm visualized. Other findings: Moderate ascites. IMPRESSION: 1. Gallstones measuring up to 8 mm noted. Gallbladder wall is thickened at 3.5 mm. This could be from hypoproteinemic states. Cholecystitis cannot be excluded. Negative Murphy sign. 2. Nodular hepatic parenchymal pattern with increased echogenicity consistent with the patient's known cirrhosis. No focal hepatic abnormality identified. Portal vein is patent with normal direction of flow. 3.  Moderate ascites. Electronically Signed   By: Marcello Moores  Register   On: 02/25/2021 05:11   CARDIAC CATHETERIZATION  Result Date: 03/09/2021 1. No evidence for left to right shunt. 2. Mildly elevated PCWP, moderately elevated RA pressure. 3. 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 4. Preserved (but not markedly high) cardiac output. 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 Would be reasonable to add sildenafil 20 mg tid.  Needs fluid removal, due for HD today.   VAS US DOPPLER PRE CABG  Result Date: 03/14/2021 PREOPERATIVE VASCULAR EVALUATION Patient Name:  Shone Cecere.  Date of Exam:   03/19/2021 Medical Rec #: [id number]            Accession #:    [id number] Date of Birth: [birth year]             Patient Gender: M Patient Age:   63Y Exam Location:  [facility] Procedure:      VAS US DOPPLER PRE CABG Referring Phys: [id number] AMY D CLEGG --------------------------------------------------------------------------------  Indications:      Pre-CABG. Risk Factors:     Hypertension, Diabetes, coronary artery disease, PAD. Other Factors:    CHF. Comparison Study: No prior studies. Performing Technologist: Darlin Coco RDMS RVT  Examination Guidelines: A complete evaluation includes B-mode imaging, spectral Doppler, color Doppler, and power Doppler as needed of all  accessible portions of each vessel. Bilateral testing is considered an integral part of a complete examination. Limited examinations for reoccurring indications may be performed as noted.  Right Carotid Findings: +----------+-------+-------+--------+---------------------------------+--------+           PSV    EDV    StenosisDescribe  Comments           cm/s   cm/s                                                     +----------+-------+-------+--------+---------------------------------+--------+ CCA Prox  94     13                                                       +----------+-------+-------+--------+---------------------------------+--------+ CCA Mid   85     17             focal, hyperechoic and smooth             +----------+-------+-------+--------+---------------------------------+--------+ CCA Distal76     21                                                       +----------+-------+-------+--------+---------------------------------+--------+ ICA Prox  66     17     1-39%   irregular, calcific and                                                   heterogenous                              +----------+-------+-------+--------+---------------------------------+--------+ ICA Distal106    35                                                       +----------+-------+-------+--------+---------------------------------+--------+ ECA       160                   heterogenous and irregular                +----------+-------+-------+--------+---------------------------------+--------+ Portions of this table do not appear on this page. +----------+--------+-------+--------+------------+           PSV cm/sEDV cmsDescribeArm Pressure +----------+--------+-------+--------+------------+ Subclavian190            WNL, AVF             +----------+--------+-------+--------+------------+ +---------+--------+--+--------+--+---------+  VertebralPSV cm/s58EDV cm/s17Antegrade +---------+--------+--+--------+--+---------+ Left Carotid Findings: +----------+-------+-------+--------+---------------------------------+--------+           PSV    EDV    StenosisDescribe                         Comments           cm/s   cm/s                                                     +----------+-------+-------+--------+---------------------------------+--------+  CCA Prox  107    15                                                       +----------+-------+-------+--------+---------------------------------+--------+ CCA Distal89     13                                                       +----------+-------+-------+--------+---------------------------------+--------+ ICA Prox  81     19     1-39%   calcific and heterogenous                 +----------+-------+-------+--------+---------------------------------+--------+ ICA Distal86     27                                                       +----------+-------+-------+--------+---------------------------------+--------+ ECA       102                   irregular, calcific and                                                   heterogenous                              +----------+-------+-------+--------+---------------------------------+--------+ +----------+--------+--------+----------------+------------+ SubclavianPSV cm/sEDV cm/sDescribe        Arm Pressure +----------+--------+--------+----------------+------------+           146             Multiphasic, WNL             +----------+--------+--------+----------------+------------+ +---------+--------+--+--------+--+---------+ VertebralPSV cm/s51EDV cm/s15Antegrade +---------+--------+--+--------+--+---------+  ABI Findings: +---------+------------------+-----+----------+--------+ Right    Rt Pressure (mmHg)IndexWaveform  Comment   +---------+------------------+-----+----------+--------+ Brachial                        triphasic AVF      +---------+------------------+-----+----------+--------+ PTA      255               2.01 monophasic         +---------+------------------+-----+----------+--------+ DP       255               2.01 biphasic           +---------+------------------+-----+----------+--------+ Great Toe83                0.65 Abnormal           +---------+------------------+-----+----------+--------+ +---------+------------------+-----+---------+-------+ Left     Lt Pressure (mmHg)IndexWaveform Comment +---------+------------------+-----+---------+-------+ Brachial 127                    triphasic        +---------+------------------+-----+---------+-------+ PTA      255               2.01 biphasic         +---------+------------------+-----+---------+-------+  DP       255               2.01 biphasic         +---------+------------------+-----+---------+-------+ Great Toe97                0.76 Normal           +---------+------------------+-----+---------+-------+ +-------+---------------+----------------+ ABI/TBIToday's ABI/TBIPrevious ABI/TBI +-------+---------------+----------------+ Right  Farson/0.65                         +-------+---------------+----------------+ Left   Anthem/0.76                         +-------+---------------+----------------+  Right Doppler Findings: +--------+--------+-----+---------+--------+ Site    PressureIndexDoppler  Comments +--------+--------+-----+---------+--------+ Brachial             triphasicAVF      +--------+--------+-----+---------+--------+ Radial               triphasic         +--------+--------+-----+---------+--------+ Ulnar                triphasic         +--------+--------+-----+---------+--------+  Left Doppler Findings: +--------+--------+-----+---------+--------+ Site     PressureIndexDoppler  Comments +--------+--------+-----+---------+--------+ [id number]          triphasic         +--------+--------+-----+---------+--------+ Radial               triphasic         +--------+--------+-----+---------+--------+ Ulnar                triphasic         +--------+--------+-----+---------+--------+  Summary: Right Carotid: Velocities in the right ICA are consistent with a 1-39% stenosis.                The ECA appears <50% stenosed. Left Carotid: Velocities in the left ICA are consistent with a 1-39% stenosis.               The ECA appears <50% stenosed. Vertebrals:  Bilateral vertebral arteries demonstrate antegrade flow. Subclavians: Normal flow hemodynamics were seen in bilateral subclavian              arteries. Right ABI: Resting right ankle-brachial index indicates noncompressible right lower extremity arteries. The right toe-brachial index is abnormal. ABIs are unreliable. RT great toe pressure = 83 mmHg. Left ABI: Resting left ankle-brachial index indicates noncompressible left lower extremity arteries. The left toe-brachial index is normal. ABIs are unreliable. LT Great toe pressure = 97 mmHg. Right Upper Extremity: Doppler waveforms remain within normal limits with right radial compression. Doppler waveforms decrease <50% with right ulnar compression. Left Upper Extremity: Doppler waveforms remain within normal limits with left radial compression. Doppler waveforms remain within normal limits with left ulnar compression.  Electronically signed by Deitra Mayo MD on 03/12/2021 at 2:10:52 PM.    Final      I have independently reviewed the above radiologic studies and discussed with the patient   Recent Lab Findings: Lab Results  Component Value Date   WBC 5.9 03/09/2021   HGB 9.5 (L) 03/01/2021   HCT 30.3 (L) 02/26/2021   PLT 165 03/21/2021   GLUCOSE 92 03/13/2021   CHOL 108 03/22/2021   TRIG 50 03/22/2021   HDL 32 (L) 02/28/2021    LDLCALC 66 03/21/2021   ALT 10 03/22/2021   AST 16 03/09/2021   NA 135  02/21/2021   K 3.7 03/22/2021   CL 98 03/01/2021   CREATININE 3.76 (H) 03/22/2021   BUN 13 03/06/2021   CO2 29 03/13/2021   TSH 7.983 (H) 02/26/2021   INR 1.3 (H) 03/14/2021   HGBA1C 6.5 (H) 03/01/2021      Assessment / Plan:      55 year old man with biventricular dysfunction and multivessel coronary artery disease.  Appreciate advanced heart failure and nephrology consultants.  Patient requests GI consultation in regards to his cirrhosis and other abdominal complaints.  Have ask interventional cardiology to review the films for consideration of PCI.  I have also asked Dr. Cyndia Bent to evaluate the patient for consideration of high risk CABG.    I  spent 40 minutes counseling the patient face to face.   Lisseth Brazeau Z. Orvan Seen, [place] 03/22/2021 8:04 AM

## 2021-03-17 NOTE — Progress Notes (Signed)
[facility] Progress Note  Name: David Rose. MRN: [id number] DOB: May 12, 1966  Chief Complaint:  Abnormal heart cath; Presented for CABG  Subjective:  Feels ok this morning.  States no surgery today - they are doing additional testing.  Dialysis went well yesterday - had 2 kg UF.    Review of systems:  Denies shortness of breath or chest pain  Denies n/v  -------- Background on consult:  David Rose. is a 55 y.o. male with a history of type 2 diabetes, hypertension, end-stage renal disease, peripheral artery disease, chronic systolic heart failure, cirrhosis, and coronary artery disease who presented to the hospital on 03/20/2021.  He had a recent heart catheterization and which demonstrated severe three-vessel disease.  He is planned for CABG tomorrow.  He was admitted to optimize pre-operatively.  Past course unfortunately complicated by steal.  He states he has an AVF and a graft in right arm and they are still using the AVF for now.  Dialyzes at [facility], T[facility].  Most he has ever taken off is 4 kg.  Sometimes a few kg sometimes rinsed back b/c under weight.  No shortness of breath or chest pain or nausea or vomiting.  Spoke with outpatient HD unit RN for orders below  Intake/Output Summary (Last 24 hours) at 03/04/2021 0608 Last data filed at 03/07/2021 1800 Gross per 24 hour  Intake 0 ml  Output 2000 ml  Net -2000 ml    Vitals:  Vitals:   02/28/2021 1907 03/06/2021 2300 02/28/2021 0300 02/27/2021 0427  BP: 102/84 (!) 109/56 109/60   Pulse:      Resp:  15 10   Temp:  97.8 F (36.6 C) 97.9 F (36.6 C)   TempSrc:  Oral Oral   SpO2:  95% 94%   Weight:    78.7 kg     Physical Exam:  General adult male in bed in no acute distress HEENT normocephalic atraumatic extraocular movements intact sclera anicteric Neck supple trachea midline Lungs clear to auscultation bilaterally normal work of breathing at rest  Heart S1S2 no rub Abdomen soft nontender  nondistended Extremities no edema appreciated; right hand wrapped Psych normal mood and affect Neuro alert and oriented x 3 provides hx and follows commands Access: RUE graft b/t and RUE AVF bruit   Medications reviewed   Labs:  BMP Latest Ref Rng & Units 02/28/2021 03/18/2021 03/05/2021  Glucose 70 - 99 mg/dL 92 - -  BUN 6 - 20 mg/dL 13 - -  Creatinine 0.61 - 1.24 mg/dL 3.76(H) 5.84(H) -  Sodium 135 - 145 mmol/L 135 - 138  Potassium 3.5 - 5.1 mmol/L 3.7 - 3.9  Chloride 98 - 111 mmol/L 98 - -  CO2 22 - 32 mmol/L 29 - -  Calcium 8.9 - 10.3 mg/dL 8.0(L) - -   Outpatient HD rx:   [facility] [phone] Use AVF; graft on inner arm  180 dialyzer 2K/2.5 Ca DF 500 BF 400 4 hours 15 g Heparin loading dose 2000 and hourly dose of 1200 EDW 78.5 kg Last post weight 78 kg on 5/21 (went in at 78 and came out at 78 kg) Meds: epogen 2400 units each tx venofer 50 mg weekly  Not on hectorol or calcitriol sensipar 30 mg three times a week with HD  Assessment/Plan:   # ESRD  - HD per TTS schedule    # CAD - per cardiology/CV surgery.  Planned for 5/25 CABG and patient states is  now on hold and he is not sure of additional plans  # Chronic systolic CHF  - heart failure managing  - optimize volume with HD  # Pulmonary HTN  - optimize volume with HD   # Anemia CKD - acceptable pre-op; on ESA outpatient; anticipate aranesp next HD  # Metabolic bone disease  - phos acceptable. Outpatient rx as above.  Defer sensipar today and watch calcium. Not on activated vit D outpatient   Claudia Desanctis, MD 03/06/2021 6:17 AM

## 2021-03-18 ENCOUNTER — Inpatient Hospital Stay (HOSPITAL_COMMUNITY): Payer: Medicare Other

## 2021-03-18 DIAGNOSIS — I5023 Acute on chronic systolic (congestive) heart failure: Secondary | ICD-10-CM | POA: Diagnosis not present

## 2021-03-18 HISTORY — PX: IR PARACENTESIS: IMG2679

## 2021-03-18 LAB — BODY FLUID CELL COUNT WITH DIFFERENTIAL
Eos, Fluid: 1 %
Lymphs, Fluid: 36 %
Monocyte-Macrophage-Serous Fluid: 63 % (ref 50–90)
Neutrophil Count, Fluid: 0 % (ref 0–25)
Total Nucleated Cell Count, Fluid: 136 cu mm (ref 0–1000)

## 2021-03-18 LAB — RETICULOCYTES
Immature Retic Fract: 4.5 % (ref 2.3–15.9)
RBC.: 2.99 MIL/uL — ABNORMAL LOW (ref 4.22–5.81)
Retic Count, Absolute: 31.1 10*3/uL (ref 19.0–186.0)
Retic Ct Pct: 1 % (ref 0.4–3.1)

## 2021-03-18 LAB — GLUCOSE, CAPILLARY
Glucose-Capillary: 104 mg/dL — ABNORMAL HIGH (ref 70–99)
Glucose-Capillary: 75 mg/dL (ref 70–99)
Glucose-Capillary: 139 mg/dL — ABNORMAL HIGH (ref 70–99)
Glucose-Capillary: 170 mg/dL — ABNORMAL HIGH (ref 70–99)

## 2021-03-18 LAB — BASIC METABOLIC PANEL
Anion gap: 12 (ref 5–15)
BUN: 27 mg/dL — ABNORMAL HIGH (ref 6–20)
CO2: 25 mmol/L (ref 22–32)
Calcium: 8 mg/dL — ABNORMAL LOW (ref 8.9–10.3)
Chloride: 95 mmol/L — ABNORMAL LOW (ref 98–111)
Creatinine, Ser: 5.28 mg/dL — ABNORMAL HIGH (ref 0.61–1.24)
GFR, Estimated: 12 mL/min — ABNORMAL LOW (ref >60)
Glucose, Bld: 108 mg/dL — ABNORMAL HIGH (ref 70–99)
Potassium: 4.5 mmol/L (ref 3.5–5.1)
Sodium: 132 mmol/L — ABNORMAL LOW (ref 135–145)

## 2021-03-18 LAB — FOLATE: Folate: 13.4 ng/mL (ref >5.9)

## 2021-03-18 LAB — IRON AND TIBC
Iron: 64 ug/dL (ref 45–182)
Saturation Ratios: 33 % (ref 17.9–39.5)
TIBC: 192 ug/dL — ABNORMAL LOW (ref 250–450)
UIBC: 128 ug/dL

## 2021-03-18 LAB — VITAMIN B12: Vitamin B-12: 433 pg/mL (ref 180–914)

## 2021-03-18 LAB — CBC
HCT: 30.7 % — ABNORMAL LOW (ref 39.0–52.0)
Hemoglobin: 9.6 g/dL — ABNORMAL LOW (ref 13.0–17.0)
MCH: 31.7 pg (ref 26.0–34.0)
MCHC: 31.3 g/dL (ref 30.0–36.0)
MCV: 101.3 fL — ABNORMAL HIGH (ref 80.0–100.0)
Platelets: 151 10*3/uL (ref 150–400)
RBC: 3.03 MIL/uL — ABNORMAL LOW (ref 4.22–5.81)
RDW: 13 % (ref 11.5–15.5)
WBC: 5.8 10*3/uL (ref 4.0–10.5)
nRBC: 0 % (ref 0.0–0.2)

## 2021-03-18 LAB — ALBUMIN, PLEURAL OR PERITONEAL FLUID: Albumin, Fluid: 2 g/dL

## 2021-03-18 LAB — FERRITIN: Ferritin: 424 ng/mL — ABNORMAL HIGH (ref 24–336)

## 2021-03-18 MED ORDER — LIDOCAINE HCL (PF) 1 % IJ SOLN
INTRAMUSCULAR | Status: DC | PRN
  Administered 2021-03-18: 10 mL

## 2021-03-18 MED ORDER — ALBUMIN HUMAN 25 % IV SOLN
INTRAVENOUS | Status: AC
  Administered 2021-03-18: 25 g via INTRAVENOUS
  Filled 2021-03-18: qty 100

## 2021-03-18 MED ORDER — DARBEPOETIN ALFA 60 MCG/0.3ML IJ SOSY
PREFILLED_SYRINGE | INTRAMUSCULAR | Status: AC
  Administered 2021-03-18: 60 ug via INTRAVENOUS
  Filled 2021-03-18: qty 0.3

## 2021-03-18 MED ORDER — DARBEPOETIN ALFA 60 MCG/0.3ML IJ SOSY
60.0000 ug | PREFILLED_SYRINGE | Freq: Once | INTRAMUSCULAR | Status: AC
  Filled 2021-03-18: qty 0.3

## 2021-03-18 MED ORDER — LIDOCAINE HCL (PF) 1 % IJ SOLN
INTRAMUSCULAR | Status: AC
  Filled 2021-03-18: qty 30

## 2021-03-18 MED ORDER — ALBUMIN HUMAN 25 % IV SOLN: 25.0000 g | Freq: Once | INTRAVENOUS | Status: AC

## 2021-03-18 NOTE — Procedures (Signed)
Seen and examined on dialysis.  Procedure supervised.  Blood pressure 112/61 and HR 61.  Tolerating goal.   Right AVF in use.  Increased UF goal; albumin ok if needed spoke with RN.    CHF reports he is to have a paracentesis today as well.  They state he will remain inpatient until CABG to optimize  Claudia Desanctis, MD 03/18/2021  8:58 AM

## 2021-03-18 NOTE — Progress Notes (Signed)
Paged [facility]/Dr Havery Moros to see if albumin should be given today after parecentesis---it was ordered for yesterday but procedure was not performed until today after dialysis this morning---however, albumin was never ordered today---ok to hold albumin, patient is chronic HD for kidney function per Dr. Havery Moros

## 2021-03-18 NOTE — Progress Notes (Signed)
Patient ID: David Rose., male   DOB: [birth year], 55 y.o.   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  Based on low EF and elevated PA pressure, right and left heart cath was done in 5/22.  This showed severe 3VD, high cardiac output with moderate pulmonary hypertension but low PVR.  He is planned for CABG, but admitted pre-op for optimization.    Subjective:    CP free. No dyspnea. Not on supplemental oxygen.   Getting HD today.   Abdominal US 5/23 w/ nodular hepatic parenchymal pattern with increased echogenicity consistent with the patient's known cirrhosis. No focal hepatic abnormality identified. Portal vein is patent. Moderate ascites. Gallstones noted.  - LFTs normal. INR 1.3   RHC Procedural Findings: Hemodynamics (mmHg) RA mean 14 RV 61/15 PA 64/25, mean 41 PCWP mean 16 Oxygen saturations: IVC/low RA 69% PA 72% AO 99% Cardiac Output (Fick) 6.82  Cardiac Index (Fick) 3.29 PVR 3.7 WU  Cardiac Output (Thermo) 6.36  Cardiac Index (Thermo) 3.07 PVR 3.9 WU PAPI 2.8  Objective:   Weight Range: 79.2 kg Body mass index is 22.42 kg/m.   Vital Signs:   Temp:  [97.7 F (36.5 C)-98.4 F (36.9 C)] 98 F (36.7 C) (05/26 0755) Pulse Rate:  [60-64] 62 (05/26 0755) Resp:  [13-20] 16 (05/26 0755) BP: (113-131)/(58-68) 119/66 (05/26 0755) SpO2:  [93 %-96 %] 96 % (05/26 0755) Weight:  [78.5 kg-79.2 kg] 79.2 kg (05/26 0755) Last BM Date: 02/28/2021  Weight change: Filed Weights   03/12/2021 0427 03/18/21 0404 03/18/21 0755  Weight: 78.7 kg 78.5 kg 79.2 kg    Intake/Output:   Intake/Output Summary (Last 24 hours) at 03/18/2021 0846 Last data filed at 03/18/2021 0700 Gross per 24 hour  Intake  720 ml  Output --  Net 720 ml      Physical Exam    General: NAD Neck: JVP 8-9 cm, no thyromegaly or thyroid nodule.  Lungs: Clear to auscultation bilaterally with normal respiratory effort. CV: Nondisplaced PMI.  Heart regular S1/S2, no S3/S4, no murmur.  No peripheral edema.   Abdomen: Soft, nontender, no hepatosplenomegaly, no distention.  Skin: Intact without lesions or rashes.  Neurologic: Alert and oriented x 3.  Psych: Normal affect. Extremities: No clubbing or cyanosis.  HEENT: Normal.    Telemetry   NSR 60s (personally reviewed)  EKG    No new EKG to review   Labs    CBC Recent Labs    02/24/2021 1545 03/07/2021 1258 03/19/2021 1259 03/07/2021 1430  WBC 8.0  --   --  5.9  NEUTROABS 5.2  --   --   --   HGB 9.9*   < > 10.5* 9.5*  HCT 31.6*   < > 31.0* 30.3*  MCV 100.3*  --   --  101.7*  PLT 169  --   --  165   < > = values in this interval not displayed.   Basic Metabolic Panel Recent Labs    03/13/2021 1545 03/13/2021 0147 03/20/2021 0058 03/18/21 0040  NA 135   < > 135 132*  K 3.6   < > 3.7 4.5  CL 98   < >  98 95*  CO2 26   < > 29 25  GLUCOSE 127*   < > 92 108*  BUN 23*   < > 13 27*  CREATININE 5.27*   < > 3.76* 5.28*  CALCIUM 7.8*   < > 8.0* 8.0*  MG 1.8  --   --   --   PHOS  --   --  4.8*  --    < > = values in this interval not displayed.   Liver Function Tests Recent Labs    02/27/2021 1545  AST 16  ALT 10  ALKPHOS 90  BILITOT 1.0  PROT 7.6  ALBUMIN 3.0*   No results for input(s): LIPASE, AMYLASE in the last 72 hours. Cardiac Enzymes No results for input(s): CKTOTAL, CKMB, CKMBINDEX, TROPONINI in the last 72 hours.  BNP: BNP (last 3 results) Recent Labs    03/11/2021 1545  BNP 1,127.8*    ProBNP (last 3 results) No results for input(s): PROBNP in the last 8760 hours.   D-Dimer No results for input(s): DDIMER in the last 72 hours. Hemoglobin A1C Recent Labs    02/21/2021 1545  HGBA1C 6.5*   Fasting Lipid Panel Recent Labs     03/21/2021 0147  CHOL 108  HDL 32*  LDLCALC 66  TRIG 50  CHOLHDL 3.4   Thyroid Function Tests Recent Labs    03/19/2021 1545  TSH 7.983*    Other results:   Imaging    CT CHEST WO CONTRAST  Result Date: 03/08/2021 CLINICAL DATA:  Biventricular dysfunction. Multi-vessel coronary artery disease. CABG planning. EXAM: CT CHEST WITHOUT CONTRAST TECHNIQUE: Multidetector CT imaging of the chest was performed following the standard protocol without IV contrast. COMPARISON:  Report from study 11/24/2014 FINDINGS: Cardiovascular: Mild four-chamber cardiac enlargement. No pericardial fluid. Extensive coronary artery calcification and/or stents. Aortic atherosclerotic calcification without evidence of aneurysm or dissection on this noncontrast study. Ascending aorta is not dilated. Mediastinum/Nodes: No mass or lymphadenopathy. Lungs/Pleura: Lungs are clear. No infiltrate, collapse or effusion. No emphysema. Upper Abdomen: Cirrhosis of the liver. Sludge or small stones dependent in the gallbladder. Prominent amount of ascites, diffusely distributed. Musculoskeletal: No significant bone finding. IMPRESSION: Cardiomegaly with four-chamber enlargement. Extensive calcification and/or stents within the coronary arteries. Aortic atherosclerotic calcification without evidence of aneurysmal dilatation or discernible dissection. No significant pulmonary finding. Cirrhosis of the liver. Gallstones dependent in the gallbladder. Abdominal ascites. Aortic Atherosclerosis (ICD10-I70.0). Electronically Signed   By: Nelson Chimes M.D.   On: 03/07/2021 11:17     Medications:     Scheduled Medications: . aspirin EC  81 mg Oral Daily  . atorvastatin  40 mg Oral Daily  . Chlorhexidine Gluconate Cloth  6 each Topical Q0600  . Chlorhexidine Gluconate Cloth  6 each Topical Q0600  . darbepoetin (ARANESP) injection - DIALYSIS  60 mcg Intravenous Once  . enoxaparin (LOVENOX) injection  30 mg Subcutaneous Q24H  . insulin  aspart  0-9 Units Subcutaneous TID WC  . metoprolol succinate  50 mg Oral QPM  . mupirocin ointment  1 application Nasal BID  . sildenafil  20 mg Oral TID  . sodium chloride flush  3 mL Intravenous Q12H  . sodium chloride flush  3 mL Intravenous Q12H  . sodium chloride flush  3 mL Intravenous Q12H    Infusions: . sodium chloride    . sodium chloride    . albumin human      PRN Medications: sodium chloride, sodium chloride, acetaminophen, nitroGLYCERIN, ondansetron (ZOFRAN) IV, sodium  chloride flush, sodium chloride flush     Assessment/Plan   1. CAD: Severe 3VD with decreased EF.  I reviewed films with Dr. Ellyn Hack, PCI would be possible but would be difficult/high risk with multiple lesions and heavy calcification.  I think that best option is still going to be CABG, will be at higher post-op bleeding risk with cirrhosis.  Moderate RV dysfunction by echo is also concerning (with high RA pressure on RHC), but PAPI adequate at 2.8. No chest pain.  - Continue ASA 81.  - Continue atorvastatin 40 mg daily.  - Continue Toprol XL 50 mg daily.  - Ongoing discussions re: CABG risk and timing of procedure.  2. Acute/chronic HF with mid range EF: Suspect ischemic cardiomyopathy.  Echo 4/22 with EF 40-45% with moderately decreased RV systolic function and PASP 99, moderate TR. There is a prominent component of RV failure with RA pressure elevated out of proportion to PCWP (CVP/PCWP 0.875 on RHC) but PAPI adequate at 2.8. Discussed with renal, plan to lower dry weight to get some extra fluid off him pre-CABG.  - Continue Toprol XL 50 mg daily.  - HD for volume management per nephrology.  3. ESRD: Suspected due to diabetes.  HD TTS.  4. 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  The CO was not markedly high.  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 Oxygen saturation is 99% on RA,  so no evidence for hepatopulmonary syndrome.   - Sildenafil 20 mg tid started.   5. Cirrhosis: Noted on abdominal US from 2020.  H/o paracentesis.  Had workup at [facility] (though patient does not remember this), viral hepatitis labs negative.  They ended up think that the cirrhosis was cardiogenic.  Repeat abdominal US 5/23 w/ nodular hepatic parenchymal pattern with increased echogenicity consistent with the patient's known cirrhosis. No focal hepatic abnormality identified. Portal vein is patent. Moderate ascites. Gallstones noted. LFTs normal. INR 1.3.  Suspect this will increase risk of surgical bleeding with CABG but not prohibitively.  GI has seen.  - Paracentesis today.    Length of Stay: 3  Loralie Champagne, MD  03/18/2021, 8:46 AM  Advanced Heart Failure Team Pager [phone] (M-F; 7a - 5p)  Please contact [facility] for night-coverage after hours (5p -7a ) and weekends on [email or web address]

## 2021-03-18 NOTE — Procedures (Signed)
PROCEDURE SUMMARY:  Successful US guided paracentesis from RLQ.  Yielded 4 L of clear yellow fluid.  No immediate complications.  Pt tolerated well.   Specimen was sent for labs.  EBL < 80m  KAscencion DikePA-C 03/18/2021 4:25 PM

## 2021-03-18 NOTE — Progress Notes (Signed)
[facility] Progress Note  Name: David Rose. MRN: [id number] DOB: 1966-02-21  Chief Complaint:  Abnormal heart cath; Presented for CABG  Subjective:  Last HD on 5/24 with 2 kg UF.  Team would like lower EDW and I agree.  States he did have a paracentesis once but has been a while.  Sometimes no UF at HD due to weight.   Review of systems:  Denies shortness of breath or chest pain    Denies n/v  -------- Background on consult:  David Rose. is a 54 y.o. male with a history of type 2 diabetes, hypertension, end-stage renal disease, peripheral artery disease, chronic systolic heart failure, cirrhosis, and coronary artery disease who presented to the hospital on 03/14/2021.  He had a recent heart catheterization and which demonstrated severe three-vessel disease.  He is planned for CABG tomorrow.  He was admitted to optimize pre-operatively.  Past course unfortunately complicated by steal.  He states he has an AVF and a graft in right arm and they are still using the AVF for now.  Dialyzes at [facility], TTS.  Most he has ever taken off is 4 kg.  Sometimes a few kg sometimes rinsed back b/c under weight.  No shortness of breath or chest pain or nausea or vomiting.  Spoke with outpatient HD unit RN for orders below  Intake/Output Summary (Last 24 hours) at 03/18/2021 0604 Last data filed at 03/01/2021 2127 Gross per 24 hour  Intake 720 ml  Output --  Net 720 ml    Vitals:  Vitals:   02/25/2021 2311 03/18/21 0256 03/18/21 0300 03/18/21 0404  BP:  120/60 129/62   Pulse:      Resp: '15 13 16   '$ Temp:  97.7 F (36.5 C)    TempSrc:  Oral    SpO2:  95%    Weight:    78.5 kg     Physical Exam:  General adult male in bed in no acute distress HEENT normocephalic atraumatic extraocular movements intact sclera anicteric Neck supple trachea midline Lungs clear to auscultation bilaterally unlabored at rest  Heart S1S2 no rub Abdomen soft nontender sl.  distended Extremities no edema appreciated; right hand wrapped Psych normal mood and affect Neuro alert and oriented x 3 provides hx and follows commands Access: RUE graft b/t and RUE AVF bruit and thrill    Medications reviewed   Labs:  BMP Latest Ref Rng & Units 03/18/2021 02/21/2021 02/22/2021  Glucose 70 - 99 mg/dL 108(H) 92 -  BUN 6 - 20 mg/dL 27(H) 13 -  Creatinine 0.61 - 1.24 mg/dL 5.28(H) 3.76(H) 5.84(H)  Sodium 135 - 145 mmol/L 132(L) 135 -  Potassium 3.5 - 5.1 mmol/L 4.5 3.7 -  Chloride 98 - 111 mmol/L 95(L) 98 -  CO2 22 - 32 mmol/L 25 29 -  Calcium 8.9 - 10.3 mg/dL 8.0(L) 8.0(L) -   [facility] [phone] Use AVF; graft on inner arm  180 dialyzer 2K/2.5 Ca DF 500 BF 400 4 hours 15 g Heparin loading dose 2000 and hourly dose of 1200 EDW 78.5 kg Last post weight 78 kg on 5/21 (went in at 78 and came out at 78 kg) Meds: epogen 2400 units each tx venofer 50 mg weekly  Not on hectorol or calcitriol sensipar 30 mg three times a week with HD  Assessment/Plan:   # ESRD  - HD per TTS schedule   - Cardiology recommends lower EDW  and I agree  # CAD - per cardiology/CV surgery.  For CABG with timing per CTS  # Chronic systolic CHF  - heart failure managing  - optimize volume with HD as above  # Pulmonary HTN  - optimize volume with HD   # Anemia CKD - on ESA outpatient; for aranesp 60 mcg on [id number]  # Metabolic bone disease  - phos acceptable. Outpatient rx as above.  Defer sensipar today and watch calcium - does normalize with correction for albumin.  Not on activated vit D outpatient   Disposition per primary team   Claudia Desanctis, MD 03/18/2021 6:14 AM

## 2021-03-19 DIAGNOSIS — K761 Chronic passive congestion of liver: Secondary | ICD-10-CM | POA: Diagnosis not present

## 2021-03-19 DIAGNOSIS — R188 Other ascites: Secondary | ICD-10-CM | POA: Diagnosis not present

## 2021-03-19 DIAGNOSIS — I5023 Acute on chronic systolic (congestive) heart failure: Secondary | ICD-10-CM | POA: Diagnosis not present

## 2021-03-19 LAB — GLUCOSE, CAPILLARY
Glucose-Capillary: 125 mg/dL — ABNORMAL HIGH (ref 70–99)
Glucose-Capillary: 136 mg/dL — ABNORMAL HIGH (ref 70–99)
Glucose-Capillary: 121 mg/dL — ABNORMAL HIGH (ref 70–99)
Glucose-Capillary: 134 mg/dL — ABNORMAL HIGH (ref 70–99)

## 2021-03-19 LAB — CBC
HCT: 30.3 % — ABNORMAL LOW (ref 39.0–52.0)
Hemoglobin: 9.6 g/dL — ABNORMAL LOW (ref 13.0–17.0)
MCH: 32.1 pg (ref 26.0–34.0)
MCHC: 31.7 g/dL (ref 30.0–36.0)
MCV: 101.3 fL — ABNORMAL HIGH (ref 80.0–100.0)
Platelets: 144 10*3/uL — ABNORMAL LOW (ref 150–400)
RBC: 2.99 MIL/uL — ABNORMAL LOW (ref 4.22–5.81)
RDW: 12.9 % (ref 11.5–15.5)
WBC: 5.6 10*3/uL (ref 4.0–10.5)
nRBC: 0 % (ref 0.0–0.2)

## 2021-03-19 LAB — BASIC METABOLIC PANEL
Anion gap: 8 (ref 5–15)
BUN: 19 mg/dL (ref 6–20)
CO2: 31 mmol/L (ref 22–32)
Calcium: 8.3 mg/dL — ABNORMAL LOW (ref 8.9–10.3)
Chloride: 98 mmol/L (ref 98–111)
Creatinine, Ser: 3.83 mg/dL — ABNORMAL HIGH (ref 0.61–1.24)
GFR, Estimated: 18 mL/min — ABNORMAL LOW (ref >60)
Glucose, Bld: 139 mg/dL — ABNORMAL HIGH (ref 70–99)
Potassium: 3.8 mmol/L (ref 3.5–5.1)
Sodium: 137 mmol/L (ref 135–145)

## 2021-03-19 LAB — PATHOLOGIST SMEAR REVIEW

## 2021-03-19 LAB — AFP TUMOR MARKER: AFP, Serum, Tumor Marker: <0.9 ng/mL (ref 0.0–8.4)

## 2021-03-19 MED ORDER — DARBEPOETIN ALFA 60 MCG/0.3ML IJ SOSY
60.0000 ug | PREFILLED_SYRINGE | INTRAMUSCULAR | Status: DC
  Filled 2021-03-19 (×2): qty 0.3

## 2021-03-19 MED ORDER — CINACALCET HCL 30 MG PO TABS
30.0000 mg | ORAL_TABLET | ORAL | Status: DC
  Administered 2021-03-25 – 2021-03-30 (×3): 30 mg via ORAL
  Filled 2021-03-19 (×6): qty 1

## 2021-03-19 MED ORDER — CHLORHEXIDINE GLUCONATE CLOTH 2 % EX PADS
6.0000 | MEDICATED_PAD | Freq: Every day | CUTANEOUS | Status: DC
  Administered 2021-03-23: 6 via TOPICAL

## 2021-03-19 NOTE — Progress Notes (Signed)
Patient ID: David Rose., male   DOB: [birth year], 55 y.o.   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  Based on low EF and elevated PA pressure, right and left heart cath was done in 5/22.  This showed severe 3VD, high cardiac output with moderate pulmonary hypertension but low PVR.  He is planned for CABG, but admitted pre-op for optimization.    Subjective:    CP free. No dyspnea. Not on supplemental oxygen.   Had HD yesterday, increased volume removal.   Abdominal US 5/23 w/ nodular hepatic parenchymal pattern with increased echogenicity consistent with the patient's known cirrhosis. No focal hepatic abnormality identified. Portal vein is patent. Moderate ascites. Gallstones noted.  - LFTs normal. INR 1.3   Paracentesis 4L on 5/26.   RHC Procedural Findings: Hemodynamics (mmHg) RA mean 14 RV 61/15 PA 64/25, mean 41 PCWP mean 16 Oxygen saturations: IVC/low RA 69% PA 72% AO 99% Cardiac Output (Fick) 6.82  Cardiac Index (Fick) 3.29 PVR 3.7 WU  Cardiac Output (Thermo) 6.36  Cardiac Index (Thermo) 3.07 PVR 3.9 WU PAPI 2.8  Objective:   Weight Range: 74.1 kg Body mass index is 20.97 kg/m.   Vital Signs:   Temp:  [97.5 F (36.4 C)-98.1 F (36.7 C)] 97.9 F (36.6 C) (05/27 0750) Pulse Rate:  [54-66] 54 (05/27 0750) Resp:  [12-17] 13 (05/27 0750) BP: (89-128)/(34-83) 111/59 (05/27 0750) SpO2:  [96 %-97 %] 96 % (05/27 0338) Weight:  [74.1 kg] 74.1 kg (05/27 0428) Last BM Date: 03/11/2021  Weight change: Filed Weights   03/18/21 0404 03/18/21 0755 03/19/21 0428  Weight: 78.5 kg 79.2 kg 74.1 kg    Intake/Output:   Intake/Output Summary (Last 24 hours) at 03/19/2021 0821 Last data filed  at 03/18/2021 1938 Gross per 24 hour  Intake 360 ml  Output 1569 ml  Net -1209 ml      Physical Exam    General: NAD Neck: JVP 8 cm, no thyromegaly or thyroid nodule.  Lungs: Clear to auscultation bilaterally with normal respiratory effort. CV: Nondisplaced PMI.  Heart regular S1/S2, no S3/S4, no murmur.  No peripheral edema.   Abdomen: Soft, nontender, no hepatosplenomegaly, no distention.  Skin: Intact without lesions or rashes.  Neurologic: Alert and oriented x 3.  Psych: Normal affect. Extremities: No clubbing or cyanosis.  HEENT: Normal.    Telemetry   NSR 60s (personally reviewed)  EKG    No new EKG to review   Labs    CBC Recent Labs    03/18/21 0930 03/19/21 0042  WBC 5.8 5.6  HGB 9.6* 9.6*  HCT 30.7* 30.3*  MCV 101.3* 101.3*  PLT 151 [id number]*   Basic Metabolic Panel Recent Labs    03/09/2021 0058 03/18/21 0040 03/19/21 0042  NA 135 132* 137  K 3.7 4.5 3.8  CL 98 95* 98  CO2 '29 25 31  '$ GLUCOSE 92 108* 139*  BUN 13 27* 19  CREATININE 3.76* 5.28* 3.83*  CALCIUM 8.0* 8.0* 8.3*  PHOS 4.8*  --   --    Liver Function Tests No results for input(s): AST, ALT, ALKPHOS, BILITOT, PROT, ALBUMIN in the last 72 hours. No results for input(s):  LIPASE, AMYLASE in the last 72 hours. Cardiac Enzymes No results for input(s): CKTOTAL, CKMB, CKMBINDEX, TROPONINI in the last 72 hours.  BNP: BNP (last 3 results) Recent Labs    03/22/2021 1545  BNP 1,127.8*    ProBNP (last 3 results) No results for input(s): PROBNP in the last 8760 hours.   D-Dimer No results for input(s): DDIMER in the last 72 hours. Hemoglobin A1C No results for input(s): HGBA1C in the last 72 hours. Fasting Lipid Panel No results for input(s): CHOL, HDL, LDLCALC, TRIG, CHOLHDL, LDLDIRECT in the last 72 hours. Thyroid Function Tests No results for input(s): TSH, T4TOTAL, T3FREE, THYROIDAB in the last 72 hours.  Invalid input(s): FREET3  Other results:   Imaging    IR  Paracentesis  Result Date: 03/18/2021 INDICATION: History of congestive heart failure. Abdominal distention. Ascites. Request for diagnostic and therapeutic paracentesis. EXAM: ULTRASOUND GUIDED RIGHT LOWER QUADRANT PARACENTESIS MEDICATIONS: 1% plain lidocaine, 5 mL COMPLICATIONS: None immediate. PROCEDURE: Informed written consent was obtained from the patient after a discussion of the risks, benefits and alternatives to treatment. A timeout was performed prior to the initiation of the procedure. Initial ultrasound scanning demonstrates a large amount of ascites within the right lower abdominal quadrant. The right lower abdomen was prepped and draped in the usual sterile fashion. 1% lidocaine was used for local anesthesia. Following this, a 19 gauge, 7-cm, Yueh catheter was introduced. An ultrasound image was saved for documentation purposes. The paracentesis was performed. The catheter was removed and a dressing was applied. The patient tolerated the procedure well without immediate post procedural complication. FINDINGS: A total of approximately 4 L of clear yellow fluid was removed. Samples were sent to the laboratory as requested by the clinical team. IMPRESSION: Successful ultrasound-guided paracentesis yielding 4 liters of peritoneal fluid. Read by: Ascencion Dike PA-C Electronically Signed   By: Miachel Roux M.D.   On: 03/18/2021 16:26     Medications:     Scheduled Medications: . aspirin EC  81 mg Oral Daily  . atorvastatin  40 mg Oral Daily  . Chlorhexidine Gluconate Cloth  6 each Topical Q0600  . Chlorhexidine Gluconate Cloth  6 each Topical Q0600  . [START ON 03/20/2021] cinacalcet  30 mg Oral Q T,Th,Sa-HD  . [START ON 03/25/2021] darbepoetin (ARANESP) injection - DIALYSIS  60 mcg Intravenous Q Thu-HD  . enoxaparin (LOVENOX) injection  30 mg Subcutaneous Q24H  . insulin aspart  0-9 Units Subcutaneous TID WC  . metoprolol succinate  50 mg Oral QPM  . mupirocin ointment  1 application Nasal  BID  . sildenafil  20 mg Oral TID  . sodium chloride flush  3 mL Intravenous Q12H  . sodium chloride flush  3 mL Intravenous Q12H  . sodium chloride flush  3 mL Intravenous Q12H    Infusions: . sodium chloride    . sodium chloride      PRN Medications: sodium chloride, sodium chloride, acetaminophen, lidocaine (PF), nitroGLYCERIN, ondansetron (ZOFRAN) IV, sodium chloride flush, sodium chloride flush     Assessment/Plan   1. CAD: Severe 3VD with decreased EF.  I reviewed films with Dr. Ellyn Hack, PCI would be possible but would be difficult/high risk with multiple lesions and heavy calcification.  I think that best option is still going to be CABG, will be at higher post-op bleeding risk with cirrhosis.  Moderate RV dysfunction by echo is also concerning (with high RA pressure on RHC), but PAPI adequate at 2.8. No chest pain.  - Continue ASA  81.  - Continue atorvastatin 40 mg daily.  - Continue Toprol XL 50 mg daily.  - Ongoing discussions re: CABG risk and timing of procedure => early next week hopefully, TCTS has asked that we keep patient in-house pre-op.  2. Acute/chronic HF with mid range EF: Suspect ischemic cardiomyopathy.  Echo 4/22 with EF 40-45% with moderately decreased RV systolic function and PASP 99, moderate TR. There is a prominent component of RV failure with RA pressure elevated out of proportion to PCWP (CVP/PCWP 0.875 on RHC) but PAPI adequate at 2.8. Discussed with renal, plan to lower dry weight to get some extra fluid off him pre-CABG.  - Continue Toprol XL 50 mg daily.  - HD for volume management per nephrology.  3. ESRD: Suspected due to diabetes.  HD TTS. Increasing volume removal at HD.  4. 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  The CO was not markedly high.  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 Oxygen saturation is 99% on RA, so no  evidence for hepatopulmonary syndrome.   - Sildenafil 20 mg tid to continue.   5. Cirrhosis: Noted on abdominal US from 2020.  H/o paracentesis.  Had workup at [facility] (though patient does not remember this), viral hepatitis labs negative.  They ended up think that the cirrhosis was cardiogenic.  Repeat abdominal US 5/23 w/ nodular hepatic parenchymal pattern with increased echogenicity consistent with the patient's known cirrhosis. No focal hepatic abnormality identified. Portal vein is patent. Moderate ascites. Gallstones noted. LFTs normal. INR 1.3.  Suspect this will increase risk of surgical bleeding with CABG but not prohibitively.  GI has seen, patient had 4L paracentesis on 5/26.   Awaiting discussion of surgical timing with TCTS.   Length of Stay: 4  Loralie Champagne, MD  03/19/2021, 8:21 AM  Advanced Heart Failure Team Pager [phone] (M-F; 7a - 5p)  Please contact [facility] for night-coverage after hours (5p -7a ) and weekends on [email or web address]

## 2021-03-19 NOTE — Progress Notes (Signed)
CARDIAC REHAB PHASE I   Reinforced preop education with pt. Pt denies questions or concerns at this time. Anxious for surgery but in good spirits. Will continue to follow pt throughout his hospital stay.  [id number] Rufina Falco, RN BSN 03/19/2021 10:51 AM

## 2021-03-19 NOTE — Progress Notes (Signed)
TCTS PN 55 yo man with multiple medial problems admitted for optimization before CABG. We are planning surgery Tues. Appreciate AHF, nephrology, and GI input. Pt also evaluated by Dr. Cyndia Bent, who agrees with proceeding with surgery.  I am out of town for family emergency. Roddrick Sharron Z. Orvan Seen, [place]

## 2021-03-19 NOTE — Care Management Important Message (Signed)
Important Message  Patient Details  Name: David Rose. MRN: [id number] Date of Birth: 11-13-1965   Medicare Important Message Given:  Yes     Nysia Dell 03/19/2021, 12:45 PM

## 2021-03-19 NOTE — Progress Notes (Signed)
[facility] Progress Note  Name: David Rose. MRN: [id number] DOB: 07-04-1966  Chief Complaint:  Abnormal heart cath; Presented for CABG  Subjective:  He got a paracentesis yesterday with 4 liters UF.  Last HD on 5/26 with 1.6 kg UF (UF was limited by hypotension; did get albumin).   States he feels ok this am.   Review of systems:   Denies shortness of breath or chest pain    Denies n/v  -------- Background on consult:  Khamar Standard. is a 55 y.o. male with a history of type 2 diabetes, hypertension, end-stage renal disease, peripheral artery disease, chronic systolic heart failure, cirrhosis, and coronary artery disease who presented to the hospital on 03/04/2021.  He had a recent heart catheterization and which demonstrated severe three-vessel disease.  He is planned for CABG tomorrow.  He was admitted to optimize pre-operatively.  Past course unfortunately complicated by steal.  He states he has an AVF and a graft in right arm and they are still using the AVF for now.  Dialyzes at [facility], TTS.  Most he has ever taken off is 4 kg.  Sometimes a few kg sometimes rinsed back b/c under weight.  No shortness of breath or chest pain or nausea or vomiting.  Spoke with outpatient HD unit RN for orders below  Intake/Output Summary (Last 24 hours) at 03/19/2021 0559 Last data filed at 03/18/2021 1938 Gross per 24 hour  Intake 600 ml  Output 1569 ml  Net -969 ml    Vitals:  Vitals:   03/18/21 2337 03/19/21 0300 03/19/21 0338 03/19/21 0428  BP: (!) 109/55  107/61   Pulse: 63  60   Resp: '17 15 14   '$ Temp: 97.9 F (36.6 C)  97.7 F (36.5 C)   TempSrc: Oral  Oral   SpO2: 97%  96%   Weight:    74.1 kg     Physical Exam:   General adult male in bed in no acute distress HEENT normocephalic atraumatic extraocular movements intact sclera anicteric Neck supple trachea midline Lungs clear to auscultation bilaterally unlabored at rest  Heart S1S2 no rub Abdomen  soft nontender nondistended Extremities no edema appreciated; right hand wrapped Psych normal mood and affect Neuro alert and oriented x 3 provides hx and follows commands Access: RUE graft b/t and RUE AVF bruit and thrill    Medications reviewed   Labs:  BMP Latest Ref Rng & Units 03/19/2021 03/18/2021 03/12/2021  Glucose 70 - 99 mg/dL 139(H) 108(H) 92  BUN 6 - 20 mg/dL 19 27(H) 13  Creatinine 0.61 - 1.24 mg/dL 3.83(H) 5.28(H) 3.76(H)  Sodium 135 - 145 mmol/L 137 132(L) 135  Potassium 3.5 - 5.1 mmol/L 3.8 4.5 3.7  Chloride 98 - 111 mmol/L 98 95(L) 98  CO2 22 - 32 mmol/L '31 25 29  '$ Calcium 8.9 - 10.3 mg/dL 8.3(L) 8.0(L) 8.0(L)   Outpatient HD rx:   [facility] T[facility] [phone] Use AVF; graft on inner arm  180 dialyzer 2K/2.5 Ca DF 500 BF 400 4 hours 15 g Heparin loading dose 2000 and hourly dose of 1200 EDW 78.5 kg Last post weight 78 kg on 5/21 (went in at 78 and came out at 78 kg) Meds: epogen 2400 units each tx venofer 50 mg weekly  Not on hectorol or calcitriol sensipar 30 mg three times a week with HD  Assessment/Plan:   # ESRD  - HD per TTS schedule   - we are lowering  his EDW as overloaded on presentation (no edema but with ascites and inc JVD)  # CAD - per cardiology/CV surgery.  For CABG with timing per CTS  # Chronic systolic CHF  - heart failure managing  - optimize volume with HD as above  # Pulmonary HTN  - optimize volume with HD   # Anemia CKD - on ESA outpatient; s/p aranesp 60 mcg on 5/26 - ordered weekly on Thursdays.  Assess Hb trends  # Metabolic bone disease  - phos acceptable. Outpatient rx as above.  sensipar 30 mg three times a week with outpatient HD - resume here and hold if Ca drops.  Not on activated vit D outpatient    Claudia Desanctis, MD 03/19/2021 6:10 AM

## 2021-03-19 NOTE — Progress Notes (Signed)
[facility] Progress Note  Chief Complaint: Cardiac cirrhosis  History: David Rose is stable today, he denies chest pain or dyspnea.  He underwent 4 L therapeutic paracentesis yesterday, straw-colored fluid and no bleeding or other complications.  Cell count not consistent with SBP.  Albumin 2.0, but no same-day serum albumin to calculate SAAG.   Objective:   Current Facility-Administered Medications:  .  0.9 %  sodium chloride infusion, 250 mL, Intravenous, PRN, David Rose, David Rose, David Rose .  0.9 %  sodium chloride infusion, 250 mL, Intravenous, PRN, David Dresser, David Rose .  acetaminophen (TYLENOL) tablet 650 mg, 650 mg, Oral, Q4H PRN, David Rose, David Rose, David Rose, 650 mg at 03/18/21 1830 .  aspirin EC tablet 81 mg, 81 mg, Oral, Daily, David Rose, David Rose, David Rose, 81 mg at 03/19/21 1033 .  atorvastatin (LIPITOR) tablet 40 mg, 40 mg, Oral, Daily, David Rose, David Rose, David Rose, 40 mg at 03/19/21 1033 .  Chlorhexidine Gluconate Cloth 2 % PADS 6 each, 6 each, Topical, Q0600, David Desanctis, David Rose, 6 each at 03/19/21 0630 .  Chlorhexidine Gluconate Cloth 2 % PADS 6 each, 6 each, Topical, Q0600, David Desanctis, David Rose, 6 each at 03/19/21 0630 .  [START ON 03/20/2021] cinacalcet (SENSIPAR) tablet 30 mg, 30 mg, Oral, Q T,Th,Sa-HD, David Desanctis, David Rose .  Derrill Memo ON 03/25/2021] Darbepoetin Alfa (ARANESP) injection 60 mcg, 60 mcg, Intravenous, Q Thu-HD, David Jeans Rose, David Rose .  enoxaparin (LOVENOX) injection 30 mg, 30 mg, Subcutaneous, Q24H, David Rose, David Rose, David Rose, 30 mg at 03/18/21 2140 .  insulin aspart (novoLOG) injection 0-9 Units, 0-9 Units, Subcutaneous, TID WC, David Rose, David Rose, David Rose .  lidocaine (PF) (XYLOCAINE) 1 % injection, , , PRN, David Rose, Kevin, David Rose, 10 mL at 03/18/21 1500 .  metoprolol succinate (TOPROL-XL) 24 hr tablet 50 mg, 50 mg, Oral, QPM, David Rose, David Rose, David Rose, 50 mg at 03/18/21 1830 .  mupirocin ointment (BACTROBAN) 2 % 1 application, 1 application, Nasal, BID, David Rose, David Rose, David Rose, 1 application at [id number] 1034 .  nitroGLYCERIN (NITROSTAT) SL tablet 0.4 mg,  0.4 mg, Sublingual, Q5 min PRN, David Rose, David Rose, David Rose .  ondansetron (ZOFRAN) injection 4 mg, 4 mg, Intravenous, Q6H PRN, David Rose, David Rose, David Rose .  sildenafil (REVATIO) tablet 20 mg, 20 mg, Oral, TID, David Dresser, David Rose, 20 mg at 03/19/21 1033 .  sodium chloride flush (NS) 0.9 % injection 3 mL, 3 mL, Intravenous, Q12H, David Rose, David Rose, David Rose, 3 mL at 03/18/21 2141 .  sodium chloride flush (NS) 0.9 % injection 3 mL, 3 mL, Intravenous, PRN, David Rose, David Rose, David Rose .  sodium chloride flush (NS) 0.9 % injection 3 mL, 3 mL, Intravenous, Q12H, David Rose, David Rose, David Rose, 3 mL at 03/19/21 1034 .  sodium chloride flush (NS) 0.9 % injection 3 mL, 3 mL, Intravenous, Q12H, David Dresser, David Rose, 3 mL at 03/18/21 2140 .  sodium chloride flush (NS) 0.9 % injection 3 mL, 3 mL, Intravenous, PRN, David Dresser, David Rose, 3 mL at 03/19/21 1035  . sodium chloride    . sodium chloride       Vital signs in last 24 hrs: Vitals:   03/19/21 0338 03/19/21 0750  BP: 107/61 (!) 111/59  Pulse: 60 (!) 54  Resp: 14 13  Temp: 97.7 F (36.5 Rose) 97.9 F (36.6 Rose)  SpO2: 96% (P) 96%    Intake/Output Summary (Last 24 hours) at 03/19/2021 1120 Last data filed at 03/18/2021 1938 Gross per 24 hour  Intake 360 ml  Output 1569  ml  Net -1209 ml     Physical Exam Chronically ill-appearing man, sarcopenia  HEENT: sclera anicteric, oral mucosa without lesions  Neck: supple, no thyromegaly, JVD or lymphadenopathy  Cardiac: RRR without murmurs, S1S2 heard, no peripheral edema  Pulm: clear to auscultation bilaterally, normal RR and effort noted  Abdomen: soft, no tenderness, with active bowel sounds. No guarding or palpable hepatosplenomegaly.  Less prominent fluid wave than last I saw him.  Bandage on paracentesis site.  No ecchymosis or leak.  Skin; warm and dry, no jaundice.  Pale  Recent Labs:  CBC Latest Ref Rng & Units 03/19/2021 03/18/2021 03/06/2021  WBC 4.0 - 10.5 K/uL 5.6 5.8 5.9  Hemoglobin 13.0 - 17.0 g/dL 9.6(L) 9.6(L) 9.5(L)  Hematocrit  39.0 - 52.0 % 30.3(L) 30.7(L) 30.3(L)  Platelets 150 - 400 K/uL 144(L) 151 165    Recent Labs  Lab 03/04/2021 1654  INR 1.3*   CMP Latest Ref Rng & Units 03/19/2021 03/18/2021 02/28/2021  Glucose 70 - 99 mg/dL 139(H) 108(H) 92  BUN 6 - 20 mg/dL 19 27(H) 13  Creatinine 0.61 - 1.24 mg/dL 3.83(H) 5.28(H) 3.76(H)  Sodium 135 - 145 mmol/L 137 132(L) 135  Potassium 3.5 - 5.1 mmol/L 3.8 4.5 3.7  Chloride 98 - 111 mmol/L 98 95(L) 98  CO2 22 - 32 mmol/L '31 25 29  '$ Calcium 8.9 - 10.3 mg/dL 8.3(L) 8.0(L) 8.0(L)  Total Protein 6.5 - 8.1 g/dL - - -  Total Bilirubin 0.3 - 1.2 mg/dL - - -  Alkaline Phos 38 - 126 U/L - - -  AST 15 - 41 U/L - - -  ALT 0 - 44 U/L - - -     Radiologic studies:   Assessment & Plan  Assessment: Cardiac cirrhosis Ascites.  Presumably portal hypertensive, cannot calculate SAAG on yesterday specimen.  Component of CKD as well.  Therapeutic paracentesis performed before planned CABG next week. See recent consult note for additional thoughts on him.  Needs an eventual post hospital upper endoscopy to screen for varices.  This patient does have a GI provider in the [facility] area.  Nothing further to add from a GI/hepatology standpoint right now.  Signing off, please call as needed.   Nelida Meuse III Office: [phone]

## 2021-03-19 NOTE — Plan of Care (Signed)

## 2021-03-20 ENCOUNTER — Encounter (HOSPITAL_COMMUNITY): Payer: Self-pay | Admitting: Cardiology

## 2021-03-20 DIAGNOSIS — I255 Ischemic cardiomyopathy: Secondary | ICD-10-CM

## 2021-03-20 DIAGNOSIS — N186 End stage renal disease: Secondary | ICD-10-CM | POA: Diagnosis not present

## 2021-03-20 DIAGNOSIS — Z992 Dependence on renal dialysis: Secondary | ICD-10-CM

## 2021-03-20 DIAGNOSIS — I2510 Atherosclerotic heart disease of native coronary artery without angina pectoris: Secondary | ICD-10-CM | POA: Diagnosis not present

## 2021-03-20 DIAGNOSIS — I5023 Acute on chronic systolic (congestive) heart failure: Secondary | ICD-10-CM | POA: Diagnosis not present

## 2021-03-20 LAB — CBC
HCT: 32.7 % — ABNORMAL LOW (ref 39.0–52.0)
Hemoglobin: 10.3 g/dL — ABNORMAL LOW (ref 13.0–17.0)
MCH: 31.7 pg (ref 26.0–34.0)
MCHC: 31.5 g/dL (ref 30.0–36.0)
MCV: 100.6 fL — ABNORMAL HIGH (ref 80.0–100.0)
Platelets: 141 10*3/uL — ABNORMAL LOW (ref 150–400)
RBC: 3.25 MIL/uL — ABNORMAL LOW (ref 4.22–5.81)
RDW: 12.9 % (ref 11.5–15.5)
WBC: 5.7 10*3/uL (ref 4.0–10.5)
nRBC: 0 % (ref 0.0–0.2)

## 2021-03-20 LAB — BASIC METABOLIC PANEL
Anion gap: 8 (ref 5–15)
BUN: 31 mg/dL — ABNORMAL HIGH (ref 6–20)
CO2: 27 mmol/L (ref 22–32)
Calcium: 8.3 mg/dL — ABNORMAL LOW (ref 8.9–10.3)
Chloride: 99 mmol/L (ref 98–111)
Creatinine, Ser: 5.21 mg/dL — ABNORMAL HIGH (ref 0.61–1.24)
GFR, Estimated: 12 mL/min — ABNORMAL LOW (ref >60)
Glucose, Bld: 148 mg/dL — ABNORMAL HIGH (ref 70–99)
Potassium: 4 mmol/L (ref 3.5–5.1)
Sodium: 134 mmol/L — ABNORMAL LOW (ref 135–145)

## 2021-03-20 LAB — GLUCOSE, CAPILLARY
Glucose-Capillary: 108 mg/dL — ABNORMAL HIGH (ref 70–99)
Glucose-Capillary: 108 mg/dL — ABNORMAL HIGH (ref 70–99)
Glucose-Capillary: 142 mg/dL — ABNORMAL HIGH (ref 70–99)

## 2021-03-20 MED ORDER — ALBUMIN HUMAN 25 % IV SOLN
25.0000 g | Freq: Once | INTRAVENOUS | Status: AC
  Administered 2021-03-20: 25 g via INTRAVENOUS

## 2021-03-20 MED ORDER — ALBUMIN HUMAN 25 % IV SOLN
INTRAVENOUS | Status: AC
  Filled 2021-03-20: qty 100

## 2021-03-20 MED ORDER — LOPERAMIDE HCL 2 MG PO CAPS
2.0000 mg | ORAL_CAPSULE | ORAL | Status: DC | PRN
  Administered 2021-03-20 – 2021-03-23 (×4): 2 mg via ORAL
  Filled 2021-03-20 (×4): qty 1

## 2021-03-20 NOTE — Plan of Care (Signed)

## 2021-03-20 NOTE — Progress Notes (Signed)
Patient ID: David Rose., male   DOB: [birth year], 55 y.o.   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  Based on low EF and elevated PA pressure, right and left heart cath was done in 5/22.  This showed severe 3VD, high cardiac output with moderate pulmonary hypertension but low PVR.  He is planned for CABG, but admitted pre-op for optimization.    Subjective:    Planned for dialysis today, last session had 1.6 kg UF but limited by hypotension. Has issues with chronic diarrhea, has good days and bad days. No chest pain, breathing stable.   Prior study results: Abdominal US 5/23 w/ nodular hepatic parenchymal pattern with increased echogenicity consistent with the patient's known cirrhosis. No focal hepatic abnormality identified. Portal vein is patent. Moderate ascites. Gallstones noted.  - LFTs normal. INR 1.3   Paracentesis 4L on 5/26.   RHC Procedural Findings: Hemodynamics (mmHg) RA mean 14 RV 61/15 PA 64/25, mean 41 PCWP mean 16 Oxygen saturations: IVC/low RA 69% PA 72% AO 99% Cardiac Output (Fick) 6.82  Cardiac Index (Fick) 3.29 PVR 3.7 WU  Cardiac Output (Thermo) 6.36  Cardiac Index (Thermo) 3.07 PVR 3.9 WU PAPI 2.8  Objective:   Weight Range: 77.1 kg Body mass index is 21.82 kg/m.   Vital Signs:   Temp:  [97.5 F (36.4 C)-98.3 F (36.8 C)] 98.3 F (36.8 C) (05/28 0300) Pulse Rate:  [60-63] 63 (05/28 0734) Resp:  [11-16] 14 (05/28 0734) BP: (103-120)/(54-63) 118/60 (05/28 0734) SpO2:  [95 %-96 %] 95 % (05/28 0734) Weight:  [77.1 kg] 77.1 kg (05/28 0300) Last BM Date: 03/20/21  Weight change: Filed Weights   03/18/21 0755 03/19/21 0428 03/20/21 0300  Weight: 79.2 kg 74.1 kg  77.1 kg    Intake/Output:   Intake/Output Summary (Last 24 hours) at 03/20/2021 0920 Last data filed at 03/19/2021 1200 Gross per 24 hour  Intake 240 ml  Output --  Net 240 ml      Physical Exam    GEN: Well nourished, well developed in no acute distress NECK: JVD at clavicle at 45 degrees CARDIAC: regular rhythm, normal S1 and S2, no rubs or gallops. No murmur. VASCULAR: Radial pulses 2+ bilaterally.  RESPIRATORY:  Clear to auscultation without rales, wheezing or rhonchi  ABDOMEN: Soft, non-tender, non-distended MUSCULOSKELETAL:  Moves all 4 limbs independently SKIN: Warm and dry, no edema NEUROLOGIC:  No focal neuro deficits noted. PSYCHIATRIC:  Normal affect   Telemetry   NSR 60s, rare PVCs  EKG    No new EKG since 03/13/2021  Labs    CBC Recent Labs    03/19/21 0042 03/20/21 0019  WBC 5.6 5.7  HGB 9.6* 10.3*  HCT 30.3* 32.7*  MCV 101.3* 100.6*  PLT 144* Q000111Q*   Basic Metabolic Panel Recent Labs    03/19/21 0042 03/20/21 0019  NA 137 134*  K 3.8 4.0  CL 98 99  CO2 31 27  GLUCOSE 139* 148*  BUN 19 31*  CREATININE 3.83* 5.21*  CALCIUM 8.3* 8.3*   Liver Function Tests No results for input(s): AST, ALT, ALKPHOS, BILITOT, PROT, ALBUMIN in the last 72 hours. No results for input(s): LIPASE, AMYLASE  in the last 72 hours. Cardiac Enzymes No results for input(s): CKTOTAL, CKMB, CKMBINDEX, TROPONINI in the last 72 hours.  BNP: BNP (last 3 results) Recent Labs    03/12/2021 1545  BNP 1,127.8*    ProBNP (last 3 results) No results for input(s): PROBNP in the last 8760 hours.   D-Dimer No results for input(s): DDIMER in the last 72 hours. Hemoglobin A1C No results for input(s): HGBA1C in the last 72 hours. Fasting Lipid Panel No results for input(s): CHOL, HDL, LDLCALC, TRIG, CHOLHDL, LDLDIRECT in the last 72 hours. Thyroid Function Tests No results for input(s): TSH, T4TOTAL, T3FREE, THYROIDAB in the last 72 hours.  Invalid input(s):  FREET3  Other results:   Imaging    No results found.   Medications:     Scheduled Medications: . aspirin EC  81 mg Oral Daily  . atorvastatin  40 mg Oral Daily  . Chlorhexidine Gluconate Cloth  6 each Topical Q0600  . Chlorhexidine Gluconate Cloth  6 each Topical Q0600  . Chlorhexidine Gluconate Cloth  6 each Topical Q0600  . cinacalcet  30 mg Oral Q T,Th,Sa-HD  . [START ON 03/25/2021] darbepoetin (ARANESP) injection - DIALYSIS  60 mcg Intravenous Q Thu-HD  . enoxaparin (LOVENOX) injection  30 mg Subcutaneous Q24H  . insulin aspart  0-9 Units Subcutaneous TID WC  . metoprolol succinate  50 mg Oral QPM  . mupirocin ointment  1 application Nasal BID  . sildenafil  20 mg Oral TID  . sodium chloride flush  3 mL Intravenous Q12H  . sodium chloride flush  3 mL Intravenous Q12H  . sodium chloride flush  3 mL Intravenous Q12H    Infusions: . sodium chloride    . sodium chloride      PRN Medications: sodium chloride, sodium chloride, acetaminophen, lidocaine (PF), loperamide, nitroGLYCERIN, ondansetron (ZOFRAN) IV, sodium chloride flush, sodium chloride flush     Assessment/Plan   1. CAD: Severe 3VD with decreased EF.  PCI would be difficult/high risk with multiple lesions and heavy calcification.  Planning for CABG in near future.  Moderate RV dysfunction by echo is also concerning (with high RA pressure on RHC), but PAPI adequate at 2.8. No chest pain.  - Continue ASA 81.  - Continue atorvastatin 40 mg daily.  - Continue Toprol XL 50 mg daily.  -Planned for CABG 02/22/2021 with Dr. Orvan Seen  2. Acute/chronic HF with mid range EF: Suspect ischemic cardiomyopathy.  Echo 4/22 with EF 40-45% with moderately decreased RV systolic function and PASP 99, moderate TR. There is a prominent component of RV failure with RA pressure elevated out of proportion to PCWP (CVP/PCWP 0.875 on RHC) but PAPI adequate at 2.8.  - Continue Toprol XL 50 mg daily.  - HD for volume management per  nephrology.   3. ESRD: Suspected due to diabetes.  HD TTS. Increasing volume removal at HD, though limited by hypotension at last session.  4. 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  The CO was not markedly high.  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 Oxygen saturation is 99% on RA, so no evidence for hepatopulmonary syndrome.   - Sildenafil 20 mg tid to continue.    5. Cirrhosis: Noted on abdominal US from 2020.  H/o paracentesis.  Had workup at [facility] (though patient does not remember this), viral hepatitis labs negative.  They ended up think that the cirrhosis was cardiogenic.  Repeat abdominal  US 5/23 w/ nodular hepatic parenchymal pattern with increased echogenicity consistent with the patient's known cirrhosis. No focal hepatic abnormality identified. Portal vein is patent. Moderate ascites. Gallstones noted. LFTs normal. INR 1.3.  Suspect this will increase risk of surgical bleeding with CABG but not prohibitively.  GI has seen, patient had 4L paracentesis on 5/26.   Length of Stay: Hubbard, MD  03/20/2021, 9:20 AM  Advanced Heart Failure Team Pager [phone] (M-F; 7a - 5p)  Please contact [facility] for night-coverage after hours (5p -7a ) and weekends on [email or web address]

## 2021-03-20 NOTE — Progress Notes (Signed)
[facility] Progress Note  Name: David Rose. MRN: [id number] DOB: 15-Apr-1966  Chief Complaint:  Abnormal heart cath; Presented for CABG  Subjective:  Feels ok this am.  Hasn't left for HD yet.  Last HD on 5/26 with 1.6 kg UF which was limited by hypotension.  Here until surgery which is 5/31 per pt  Review of systems:   No shortness of breath or chest pain  Denies n/v  -------- Background on consult:  David Rose. is a 55 y.o. male with a history of type 2 diabetes, hypertension, end-stage renal disease, peripheral artery disease, chronic systolic heart failure, cirrhosis, and coronary artery disease who presented to the hospital on 02/23/2021.  He had a recent heart catheterization and which demonstrated severe three-vessel disease.  He is planned for CABG tomorrow.  He was admitted to optimize pre-operatively.  Past course unfortunately complicated by steal.  He states he has an AVF and a graft in right arm and they are still using the AVF for now.  Dialyzes at [facility], TTS.  Most he has ever taken off is 4 kg.  Sometimes a few kg sometimes rinsed back b/c under weight.  No shortness of breath or chest pain or nausea or vomiting.  Spoke with outpatient HD unit RN for orders below  Intake/Output Summary (Last 24 hours) at 03/20/2021 0621 Last data filed at 03/19/2021 1200 Gross per 24 hour  Intake 480 ml  Output --  Net 480 ml    Vitals:  Vitals:   03/19/21 1952 03/20/21 0003 03/20/21 0300 03/20/21 0400  BP: (!) 105/54 (!) 103/55 110/63 (!) 114/55  Pulse: 61 60 63 61  Resp: '13 14 13 11  '$ Temp: 97.6 F (36.4 C) 97.7 F (36.5 C) 98.3 F (36.8 C)   TempSrc: Oral Oral Oral   SpO2: 96%  95% 96%  Weight:   77.1 kg      Physical Exam:   General adult male in bed in no acute distress HEENT normocephalic atraumatic extraocular movements intact sclera anicteric Neck supple trachea midline Lungs clear to auscultation bilaterally unlabored at rest   Heart S1S2 no rub Abdomen soft nontender nondistended Extremities no edema ; right hand wrapped Psych normal mood and affect Neuro alert and oriented x 3 provides hx and follows commands Access: RUE graft b/t and RUE AVF bruit and thrill    Medications reviewed   Labs:  BMP Latest Ref Rng & Units 03/20/2021 03/19/2021 03/18/2021  Glucose 70 - 99 mg/dL 148(H) 139(H) 108(H)  BUN 6 - 20 mg/dL 31(H) 19 27(H)  Creatinine 0.61 - 1.24 mg/dL 5.21(H) 3.83(H) 5.28(H)  Sodium 135 - 145 mmol/L 134(L) 137 132(L)  Potassium 3.5 - 5.1 mmol/L 4.0 3.8 4.5  Chloride 98 - 111 mmol/L 99 98 95(L)  CO2 22 - 32 mmol/L '27 31 25  '$ Calcium 8.9 - 123456 mg/dL 8.3(L) 8.3(L) 8.0(L)   Outpatient HD rx:   [facility] [phone] Use AVF; graft on inner arm  180 dialyzer 2K/2.5 Ca DF 500 BF 400 4 hours 15 g Heparin loading dose 2000 and hourly dose of 1200 EDW 78.5 kg Last post weight 78 kg on 5/21 (went in at 78 and came out at 78 kg) Meds: epogen 2400 units each tx venofer 50 mg weekly  Not on hectorol or calcitriol sensipar 30 mg three times a week with HD  Assessment/Plan:   # ESRD  - HD per TTS schedule   - we are lowering  his EDW as overloaded on presentation [no LE edema but with ascites (s/p 4 liter paracentesis) and inc JVD]  # CAD - per cardiology/CV surgery.  For CABG with timing per CTS  # Chronic systolic CHF  - heart failure managing  - optimize volume with HD as above   # Pulmonary HTN  - optimize volume with HD   # Anemia CKD - on ESA outpatient; s/p aranesp 60 mcg on 5/26 - ordered weekly on Thursdays.  Assess Hb trends  # Metabolic bone disease  - phos acceptable. Outpatient rx as above.  sensipar 30 mg three times a week with outpatient HD - resume here and hold if Ca drops.  Not on activated vit D outpatient   # Cirrhosis with ascites - s/p 4 liter paracentesis here as above   dispo - here until after his CABG which he states is planned for Tuesday,  5/31  David Desanctis, MD 03/20/2021 6:36 AM

## 2021-03-20 NOTE — Progress Notes (Signed)
Patient suddenly nauseated and dry heaving. Zofran '4mg'$  IV given

## 2021-03-21 DIAGNOSIS — I5023 Acute on chronic systolic (congestive) heart failure: Secondary | ICD-10-CM | POA: Diagnosis not present

## 2021-03-21 DIAGNOSIS — N186 End stage renal disease: Secondary | ICD-10-CM | POA: Diagnosis not present

## 2021-03-21 DIAGNOSIS — I255 Ischemic cardiomyopathy: Secondary | ICD-10-CM | POA: Diagnosis not present

## 2021-03-21 DIAGNOSIS — I2510 Atherosclerotic heart disease of native coronary artery without angina pectoris: Secondary | ICD-10-CM | POA: Diagnosis not present

## 2021-03-21 LAB — RENAL FUNCTION PANEL
Albumin: 3.3 g/dL — ABNORMAL LOW (ref 3.5–5.0)
Anion gap: 9 (ref 5–15)
BUN: 18 mg/dL (ref 6–20)
CO2: 28 mmol/L (ref 22–32)
Calcium: 8.6 mg/dL — ABNORMAL LOW (ref 8.9–10.3)
Chloride: 97 mmol/L — ABNORMAL LOW (ref 98–111)
Creatinine, Ser: 3.6 mg/dL — ABNORMAL HIGH (ref 0.61–1.24)
GFR, Estimated: 19 mL/min — ABNORMAL LOW (ref >60)
Glucose, Bld: 141 mg/dL — ABNORMAL HIGH (ref 70–99)
Phosphorus: 3.3 mg/dL (ref 2.5–4.6)
Potassium: 3.8 mmol/L (ref 3.5–5.1)
Sodium: 134 mmol/L — ABNORMAL LOW (ref 135–145)

## 2021-03-21 LAB — CBC
HCT: 34.4 % — ABNORMAL LOW (ref 39.0–52.0)
Hemoglobin: 10.8 g/dL — ABNORMAL LOW (ref 13.0–17.0)
MCH: 31.7 pg (ref 26.0–34.0)
MCHC: 31.4 g/dL (ref 30.0–36.0)
MCV: 100.9 fL — ABNORMAL HIGH (ref 80.0–100.0)
Platelets: 162 10*3/uL (ref 150–400)
RBC: 3.41 MIL/uL — ABNORMAL LOW (ref 4.22–5.81)
RDW: 13 % (ref 11.5–15.5)
WBC: 6.4 10*3/uL (ref 4.0–10.5)
nRBC: 0 % (ref 0.0–0.2)

## 2021-03-21 LAB — GLUCOSE, CAPILLARY
Glucose-Capillary: 123 mg/dL — ABNORMAL HIGH (ref 70–99)
Glucose-Capillary: 160 mg/dL — ABNORMAL HIGH (ref 70–99)
Glucose-Capillary: 149 mg/dL — ABNORMAL HIGH (ref 70–99)
Glucose-Capillary: 178 mg/dL — ABNORMAL HIGH (ref 70–99)

## 2021-03-21 MED ORDER — CHLORHEXIDINE GLUCONATE CLOTH 2 % EX PADS
6.0000 | MEDICATED_PAD | Freq: Every day | CUTANEOUS | Status: DC
  Administered 2021-03-23: 6 via TOPICAL

## 2021-03-21 NOTE — Progress Notes (Signed)
Patient ID: David Rose., male   DOB: [birth year], 55 y.o.   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  Based on low EF and elevated PA pressure, right and left heart cath was done in 5/22.  This showed severe 3VD, high cardiac output with moderate pulmonary hypertension but low PVR.  He is planned for CABG, but admitted pre-op for optimization.    Subjective:    Tolerated dialysis well yesterday, 2L removed. No acute events overnight.  Prior study results: Abdominal US 5/23 w/ nodular hepatic parenchymal pattern with increased echogenicity consistent with the patient's known cirrhosis. No focal hepatic abnormality identified. Portal vein is patent. Moderate ascites. Gallstones noted.  - LFTs normal. INR 1.3   Paracentesis 4L on 5/26.   RHC Procedural Findings: Hemodynamics (mmHg) RA mean 14 RV 61/15 PA 64/25, mean 41 PCWP mean 16 Oxygen saturations: IVC/low RA 69% PA 72% AO 99% Cardiac Output (Fick) 6.82  Cardiac Index (Fick) 3.29 PVR 3.7 WU  Cardiac Output (Thermo) 6.36  Cardiac Index (Thermo) 3.07 PVR 3.9 WU PAPI 2.8  Objective:   Weight Range: 73 kg Body mass index is 20.66 kg/m.   Vital Signs:   Temp:  [97.6 F (36.4 C)-98.1 F (36.7 C)] 97.8 F (36.6 C) (05/29 0728) Pulse Rate:  [61-72] 63 (05/29 0728) Resp:  [11-18] 18 (05/29 0728) BP: (97-139)/(48-69) 134/65 (05/29 0728) SpO2:  [96 %-100 %] 100 % (05/29 0728) Weight:  [72 kg-73.1 kg] 73 kg (05/29 0300) Last BM Date: 03/20/21  Weight change: Filed Weights   03/20/21 1250 03/20/21 1645 03/21/21 0300  Weight: 73.1 kg 72 kg 73 kg    Intake/Output:   Intake/Output Summary (Last 24 hours) at 03/21/2021 [id number] Last data filed at  03/21/2021 0800 Gross per 24 hour  Intake 240 ml  Output 1981 ml  Net -1741 ml      Physical Exam    GEN: Well nourished, well developed in no acute distress NECK: No JVD at 60 degrees CARDIAC: regular rhythm, normal S1 and S2, no rubs or gallops. No murmur. VASCULAR: Radial pulses 2+ bilaterally.  RESPIRATORY:  Clear to auscultation without rales, wheezing or rhonchi  ABDOMEN: Soft, non-tender, non-distended MUSCULOSKELETAL:  Moves all 4 limbs independently SKIN: Warm and dry, no edema NEUROLOGIC:  No focal neuro deficits noted. PSYCHIATRIC:  Normal affect   Telemetry   NSR 60s, rare PVCs  EKG    No new EKG since 02/27/2021  Labs    CBC Recent Labs    03/20/21 0019 03/21/21 0125  WBC 5.7 6.4  HGB 10.3* 10.8*  HCT 32.7* 34.4*  MCV 100.6* 100.9*  PLT 141* 0000000   Basic Metabolic Panel Recent Labs    03/20/21 0019 03/21/21 0125  NA 134* 134*  K 4.0 3.8  CL 99 97*  CO2 27 28  GLUCOSE 148* 141*  BUN 31* 18  CREATININE 5.21* 3.60*  CALCIUM 8.3* 8.6*  PHOS  --  3.3   Liver Function Tests Recent Labs    03/21/21 0125  ALBUMIN 3.3*   No results for input(s): LIPASE, AMYLASE in the last 72 hours. Cardiac Enzymes No results for input(s): CKTOTAL, CKMB, CKMBINDEX, TROPONINI in the  last 72 hours.  BNP: BNP (last 3 results) Recent Labs    02/27/2021 1545  BNP 1,127.8*    ProBNP (last 3 results) No results for input(s): PROBNP in the last 8760 hours.   D-Dimer No results for input(s): DDIMER in the last 72 hours. Hemoglobin A1C No results for input(s): HGBA1C in the last 72 hours. Fasting Lipid Panel No results for input(s): CHOL, HDL, LDLCALC, TRIG, CHOLHDL, LDLDIRECT in the last 72 hours. Thyroid Function Tests No results for input(s): TSH, T4TOTAL, T3FREE, THYROIDAB in the last 72 hours.  Invalid input(s): FREET3  Other results:   Imaging    No results found.   Medications:     Scheduled Medications: . aspirin EC  81 mg Oral Daily   . atorvastatin  40 mg Oral Daily  . Chlorhexidine Gluconate Cloth  6 each Topical Q0600  . cinacalcet  30 mg Oral Q T,Th,Sa-HD  . [START ON 03/25/2021] darbepoetin (ARANESP) injection - DIALYSIS  60 mcg Intravenous Q Thu-HD  . enoxaparin (LOVENOX) injection  30 mg Subcutaneous Q24H  . insulin aspart  0-9 Units Subcutaneous TID WC  . metoprolol succinate  50 mg Oral QPM  . sildenafil  20 mg Oral TID  . sodium chloride flush  3 mL Intravenous Q12H  . sodium chloride flush  3 mL Intravenous Q12H  . sodium chloride flush  3 mL Intravenous Q12H    Infusions: . sodium chloride    . sodium chloride      PRN Medications: sodium chloride, sodium chloride, acetaminophen, lidocaine (PF), loperamide, nitroGLYCERIN, ondansetron (ZOFRAN) IV, sodium chloride flush, sodium chloride flush     Assessment/Plan   1. CAD: Severe 3VD with decreased EF.  PCI would be difficult/high risk with multiple lesions and heavy calcification.  Planning for CABG in near future.  Moderate RV dysfunction by echo is also concerning (with high RA pressure on RHC), but PAPI adequate at 2.8. No chest pain.  - Continue ASA 81.  - Continue atorvastatin 40 mg daily.  - Continue Toprol XL 50 mg daily.  -Planned for CABG 03/07/2021 with Dr. Orvan Seen  2. Acute/chronic HF with mid range EF: Suspect ischemic cardiomyopathy.  Echo 4/22 with EF 40-45% with moderately decreased RV systolic function and PASP 99, moderate TR. There is a prominent component of RV failure with RA pressure elevated out of proportion to PCWP (CVP/PCWP 0.875 on RHC) but PAPI adequate at 2.8.  - Continue Toprol XL 50 mg daily.  - HD for volume management per nephrology.   3. ESRD: Suspected due to diabetes.  HD TTS.  -tolerated 2L fluid removal 5/28, planned for HD 5/30 off schedule to optimize prior to surgery  4. 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   The CO was not markedly high.  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 Oxygen saturation is 99% on RA, so no evidence for hepatopulmonary syndrome.   - Sildenafil 20 mg tid to continue.    5. Cirrhosis: Noted on abdominal US from 2020.  H/o paracentesis.  Had workup at [facility] (though patient does not remember this), viral hepatitis labs negative.  They ended up think that the cirrhosis was cardiogenic.  Repeat abdominal US 5/23 w/ nodular hepatic parenchymal pattern with increased echogenicity consistent with the patient's known cirrhosis. No focal hepatic abnormality identified. Portal vein is patent. Moderate ascites. Gallstones noted. LFTs normal. INR 1.3.  Suspect this will increase risk of surgical  bleeding with CABG but not prohibitively.  GI has seen, patient had 4L paracentesis on 5/26.   Length of Stay: [place], MD  03/21/2021, 9:07 AM  Advanced Heart Failure Team Pager [phone] (M-F; 7a - 5p)  Please contact [facility] for night-coverage after hours (5p -7a ) and weekends on [email or web address]

## 2021-03-21 NOTE — Plan of Care (Signed)

## 2021-03-21 NOTE — Progress Notes (Signed)
[facility] Progress Note  Name: David Rose. MRN: [id number] DOB: October 31, 1965  Chief Complaint:  Abnormal heart cath; Presented for CABG  Subjective:  Had HD on 5/28 with 2 kg UF.  He states surgery still planned for Tues, 5/31.  He's ok with dialyzing a day early off schedule to help coordinate.    Review of systems:    No shortness of breath or chest pain  Denies n/v now but did have nausea last night - resolved  -------- Background on consult:  David Rose. is a 55 y.o. male with a history of type 2 diabetes, hypertension, end-stage renal disease, peripheral artery disease, chronic systolic heart failure, cirrhosis, and coronary artery disease who presented to the hospital on 03/13/2021.  He had a recent heart catheterization and which demonstrated severe three-vessel disease.  He is planned for CABG tomorrow.  He was admitted to optimize pre-operatively.  Past course unfortunately complicated by steal.  He states he has an AVF and a graft in right arm and they are still using the AVF for now.  Dialyzes at [facility], TTS.  Most he has ever taken off is 4 kg.  Sometimes a few kg sometimes rinsed back b/c under weight.  No shortness of breath or chest pain or nausea or vomiting.  Spoke with outpatient HD unit RN for orders below  Intake/Output Summary (Last 24 hours) at 03/21/2021 0558 Last data filed at 03/20/2021 1633 Gross per 24 hour  Intake --  Output 1981 ml  Net -1981 ml    Vitals:  Vitals:   03/20/21 1710 03/20/21 1928 03/21/21 0023 03/21/21 0300  BP: 136/67 139/69 132/65 129/66  Pulse: 72 72 67 63  Resp: '18 17 13 12  '$ Temp: 97.9 F (36.6 C) 97.9 F (36.6 C) 98.1 F (36.7 C) 97.8 F (36.6 C)  TempSrc: Oral Oral Oral Oral  SpO2: 98% 99% 100% 99%  Weight:         Physical Exam:  General adult male in bed in no acute distress HEENT normocephalic atraumatic extraocular movements intact sclera anicteric Neck supple trachea midline;  increased JVD Lungs clear to auscultation bilaterally normal work of breathing on room air   Heart S1S2 no rub Abdomen soft nontender nondistended Extremities no edema ; right hand wrapped Psych normal mood and affect Neuro alert and oriented x 3 provides hx and follows commands Access: RUE graft b/t and RUE AVF bruit and thrill    Medications reviewed   Labs:  BMP Latest Ref Rng & Units 03/21/2021 03/20/2021 03/19/2021  Glucose 70 - 99 mg/dL 141(H) 148(H) 139(H)  BUN 6 - 20 mg/dL 18 31(H) 19  Creatinine 0.61 - 1.24 mg/dL 3.60(H) 5.21(H) 3.83(H)  Sodium 135 - 145 mmol/L 134(L) 134(L) 137  Potassium 3.5 - 5.1 mmol/L 3.8 4.0 3.8  Chloride 98 - 111 mmol/L 97(L) 99 98  CO2 22 - 32 mmol/L '28 27 31  '$ Calcium 8.9 - 10.3 mg/dL 8.6(L) 8.3(L) 8.3(L)   Outpatient HD rx:   [facility] T[facility] [phone] Use AVF; graft on inner arm  180 dialyzer 2K/2.5 Ca DF 500 BF 400 4 hours 15 g Heparin loading dose 2000 and hourly dose of 1200 EDW 78.5 kg Last post weight 78 kg on 5/21 (went in at 78 and came out at 78 kg) Meds: epogen 2400 units each tx venofer 50 mg weekly  Not on hectorol or calcitriol sensipar 30 mg three times a week with HD  Assessment/Plan:   #  ESRD  - HD per TTS schedule normally - will plan for HD on 5/30 off schedule in anticipation of CABG on 5/31  - note we are lowering his EDW as he was overloaded on presentation.  He has no LE edema but had ascites (s/p 4 liter paracentesis) and inc JVD  # CAD - per cardiology/CV surgery.  For CABG with timing per CTS  # Chronic systolic CHF  - heart failure managing - optimize volume with HD as above   # Pulmonary HTN  - optimize volume with HD   # Anemia CKD - on ESA outpatient; s/p aranesp 60 mcg on 5/26 - ordered weekly on Thursdays.  Assess Hb trends for next dose  # Metabolic bone disease  - phos acceptable. Outpatient rx as above.  sensipar 30 mg three times a week given with HD.  hold if Ca drops.  Not on  activated vit D outpatient   # Cirrhosis with ascites - s/p 4 liter paracentesis here as above   dispo - here until after his CABG which he states is planned for Tuesday, 5/31  Claudia Desanctis, MD 03/21/2021 6:09 AM

## 2021-03-22 ENCOUNTER — Inpatient Hospital Stay (HOSPITAL_COMMUNITY): Payer: Medicare Other

## 2021-03-22 DIAGNOSIS — I5023 Acute on chronic systolic (congestive) heart failure: Secondary | ICD-10-CM | POA: Diagnosis not present

## 2021-03-22 DIAGNOSIS — I2510 Atherosclerotic heart disease of native coronary artery without angina pectoris: Secondary | ICD-10-CM | POA: Diagnosis not present

## 2021-03-22 LAB — COMPREHENSIVE METABOLIC PANEL
ALT: 10 U/L (ref 0–44)
AST: 11 U/L — ABNORMAL LOW (ref 15–41)
Albumin: 3.2 g/dL — ABNORMAL LOW (ref 3.5–5.0)
Alkaline Phosphatase: 90 U/L (ref 38–126)
Anion gap: 10 (ref 5–15)
BUN: 32 mg/dL — ABNORMAL HIGH (ref 6–20)
CO2: 27 mmol/L (ref 22–32)
Calcium: 8.6 mg/dL — ABNORMAL LOW (ref 8.9–10.3)
Chloride: 97 mmol/L — ABNORMAL LOW (ref 98–111)
Creatinine, Ser: 6.04 mg/dL — ABNORMAL HIGH (ref 0.61–1.24)
GFR, Estimated: 10 mL/min — ABNORMAL LOW (ref >60)
Glucose, Bld: 134 mg/dL — ABNORMAL HIGH (ref 70–99)
Potassium: 3.9 mmol/L (ref 3.5–5.1)
Sodium: 134 mmol/L — ABNORMAL LOW (ref 135–145)
Total Bilirubin: 0.6 mg/dL (ref 0.3–1.2)
Total Protein: 7.1 g/dL (ref 6.5–8.1)

## 2021-03-22 LAB — GLUCOSE, CAPILLARY
Glucose-Capillary: 108 mg/dL — ABNORMAL HIGH (ref 70–99)
Glucose-Capillary: 160 mg/dL — ABNORMAL HIGH (ref 70–99)
Glucose-Capillary: 149 mg/dL — ABNORMAL HIGH (ref 70–99)

## 2021-03-22 LAB — PROTIME-INR
INR: 1.2 (ref 0.8–1.2)
Prothrombin Time: 15.6 seconds — ABNORMAL HIGH (ref 11.4–15.2)

## 2021-03-22 LAB — CBC
HCT: 32.6 % — ABNORMAL LOW (ref 39.0–52.0)
Hemoglobin: 10.3 g/dL — ABNORMAL LOW (ref 13.0–17.0)
MCH: 32 pg (ref 26.0–34.0)
MCHC: 31.6 g/dL (ref 30.0–36.0)
MCV: 101.2 fL — ABNORMAL HIGH (ref 80.0–100.0)
Platelets: 178 10*3/uL (ref 150–400)
RBC: 3.22 MIL/uL — ABNORMAL LOW (ref 4.22–5.81)
RDW: 13 % (ref 11.5–15.5)
WBC: 8 10*3/uL (ref 4.0–10.5)
nRBC: 0 % (ref 0.0–0.2)

## 2021-03-22 LAB — ABO/RH: ABO/RH(D): A POS

## 2021-03-22 LAB — CREATININE, SERUM
Creatinine, Ser: 5.37 mg/dL — ABNORMAL HIGH (ref 0.61–1.24)
GFR, Estimated: 12 mL/min — ABNORMAL LOW (ref >60)

## 2021-03-22 MED ORDER — BISACODYL 5 MG PO TBEC
5.0000 mg | DELAYED_RELEASE_TABLET | Freq: Once | ORAL | Status: AC
  Administered 2021-03-22: 5 mg via ORAL
  Filled 2021-03-22: qty 1

## 2021-03-22 MED ORDER — NITROGLYCERIN IN D5W 200-5 MCG/ML-% IV SOLN
2.0000 ug/min | INTRAVENOUS | Status: DC
  Filled 2021-03-22: qty 250

## 2021-03-22 MED ORDER — CEFAZOLIN SODIUM-DEXTROSE 2-4 GM/100ML-% IV SOLN
2.0000 g | INTRAVENOUS | Status: DC
  Filled 2021-03-22: qty 100

## 2021-03-22 MED ORDER — INSULIN REGULAR(HUMAN) IN NACL 100-0.9 UT/100ML-% IV SOLN
INTRAVENOUS | Status: DC
  Filled 2021-03-22: qty 100

## 2021-03-22 MED ORDER — PLASMA-LYTE 148 IV SOLN
INTRAVENOUS | Status: DC
  Filled 2021-03-22: qty 2.5

## 2021-03-22 MED ORDER — VANCOMYCIN HCL 1250 MG/250ML IV SOLN
1250.0000 mg | INTRAVENOUS | Status: AC
  Administered 2021-03-23: 1250 mg via INTRAVENOUS
  Filled 2021-03-22: qty 250

## 2021-03-22 MED ORDER — CHLORHEXIDINE GLUCONATE 0.12 % MT SOLN
15.0000 mL | Freq: Once | OROMUCOSAL | Status: AC
  Administered 2021-03-23: 15 mL via OROMUCOSAL
  Filled 2021-03-22: qty 15

## 2021-03-22 MED ORDER — POTASSIUM CHLORIDE 2 MEQ/ML IV SOLN
80.0000 meq | INTRAVENOUS | Status: DC
  Filled 2021-03-22: qty 40

## 2021-03-22 MED ORDER — CHLORHEXIDINE GLUCONATE CLOTH 2 % EX PADS
6.0000 | MEDICATED_PAD | Freq: Once | CUTANEOUS | Status: AC
  Administered 2021-03-22: 6 via TOPICAL

## 2021-03-22 MED ORDER — CHLORHEXIDINE GLUCONATE CLOTH 2 % EX PADS
6.0000 | MEDICATED_PAD | Freq: Once | CUTANEOUS | Status: AC
  Administered 2021-03-23: 6 via TOPICAL

## 2021-03-22 MED ORDER — TRANEXAMIC ACID 1000 MG/10ML IV SOLN
1.5000 mg/kg/h | INTRAVENOUS | Status: DC
  Filled 2021-03-22: qty 25

## 2021-03-22 MED ORDER — METOPROLOL TARTRATE 12.5 MG HALF TABLET
12.5000 mg | ORAL_TABLET | Freq: Once | ORAL | Status: AC
  Administered 2021-03-23: 12.5 mg via ORAL
  Filled 2021-03-22: qty 1

## 2021-03-22 MED ORDER — CEFAZOLIN SODIUM-DEXTROSE 2-4 GM/100ML-% IV SOLN
2.0000 g | INTRAVENOUS | Status: AC
  Administered 2021-03-23 (×2): 2 g via INTRAVENOUS
  Filled 2021-03-22: qty 100

## 2021-03-22 MED ORDER — EPINEPHRINE HCL 5 MG/250ML IV SOLN IN NS
0.0000 ug/min | INTRAVENOUS | Status: AC
  Administered 2021-03-23: 1 ug/min via INTRAVENOUS
  Filled 2021-03-22: qty 250

## 2021-03-22 MED ORDER — TRANEXAMIC ACID (OHS) PUMP PRIME SOLUTION
2.0000 mg/kg | INTRAVENOUS | Status: DC
  Filled 2021-03-22: qty 1.48

## 2021-03-22 MED ORDER — DEXMEDETOMIDINE HCL IN NACL 400 MCG/100ML IV SOLN
0.1000 ug/kg/h | INTRAVENOUS | Status: DC
  Filled 2021-03-22: qty 100

## 2021-03-22 MED ORDER — NOREPINEPHRINE 4 MG/250ML-% IV SOLN
0.0000 ug/min | INTRAVENOUS | Status: DC
  Filled 2021-03-22: qty 250

## 2021-03-22 MED ORDER — DIAZEPAM 2 MG PO TABS
2.0000 mg | ORAL_TABLET | Freq: Once | ORAL | Status: AC
  Administered 2021-03-23: 2 mg via ORAL
  Filled 2021-03-22: qty 1

## 2021-03-22 MED ORDER — PHENYLEPHRINE HCL-NACL 20-0.9 MG/250ML-% IV SOLN
30.0000 ug/min | INTRAVENOUS | Status: DC
Start: 2021-03-23 — End: 2021-03-23
  Filled 2021-03-22: qty 250

## 2021-03-22 MED ORDER — MAGNESIUM SULFATE 50 % IJ SOLN
40.0000 meq | INTRAMUSCULAR | Status: DC
  Filled 2021-03-22: qty 9.85

## 2021-03-22 MED ORDER — TRANEXAMIC ACID (OHS) BOLUS VIA INFUSION
15.0000 mg/kg | INTRAVENOUS | Status: AC
  Administered 2021-03-23: 1111.5 mg via INTRAVENOUS
  Filled 2021-03-22: qty 1112

## 2021-03-22 MED ORDER — MILRINONE LACTATE IN DEXTROSE 20-5 MG/100ML-% IV SOLN
0.3000 ug/kg/min | INTRAVENOUS | Status: AC
  Administered 2021-03-23: .2 ug/kg/min via INTRAVENOUS
  Filled 2021-03-22: qty 100

## 2021-03-22 MED ORDER — SODIUM CHLORIDE 0.9 % IV SOLN
INTRAVENOUS | Status: DC
  Filled 2021-03-22: qty 30

## 2021-03-22 NOTE — Plan of Care (Signed)

## 2021-03-22 NOTE — Progress Notes (Addendum)
Patient ID: David Crute., male   DOB: [birth year], 55 y.o.   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  Based on low EF and elevated PA pressure, right and left heart cath was done in 5/22.  This showed severe 3VD, high cardiac output with moderate pulmonary hypertension but low PVR.  He is planned for CABG, but admitted pre-op for optimization.    Subjective:    CABG planned for tomorrow. Per nephrology, plan is to undertake dialysis today off schedule to allow for optimization of surgery tomorrow. VSS. Resting comfortably in bed. No complaints. Denies CP and dyspnea.   Abdominal US 5/23 w/ nodular hepatic parenchymal pattern with increased echogenicity consistent with the patient's known cirrhosis. No focal hepatic abnormality identified. Portal vein is patent. Moderate ascites. Gallstones noted.  - LFTs normal. INR 1.3   Paracentesis 4L on 5/26.   RHC Procedural Findings: Hemodynamics (mmHg) RA mean 14 RV 61/15 PA 64/25, mean 41 PCWP mean 16 Oxygen saturations: IVC/low RA 69% PA 72% AO 99% Cardiac Output (Fick) 6.82  Cardiac Index (Fick) 3.29 PVR 3.7 WU  Cardiac Output (Thermo) 6.36  Cardiac Index (Thermo) 3.07 PVR 3.9 WU PAPI 2.8  Objective:   Weight Range: 73.5 kg Body mass index is 20.8 kg/m.   Vital Signs:   Temp:  [97.6 F (36.4 C)-98 F (36.7 C)] 97.6 F (36.4 C) (05/30 0721) Pulse Rate:  [52-64] 59 (05/30 0721) Resp:  [13-16] 14 (05/30 0721) BP: (101-131)/(52-66) 110/61 (05/30 0721) SpO2:  [98 %-100 %] 98 % (05/30 0721) Weight:  [73.5 kg] 73.5 kg (05/30 0409) Last BM Date: 03/21/21  Weight change: Filed Weights   03/20/21 1645 03/21/21 0300 03/22/21 0409  Weight: 72 kg 73  kg 73.5 kg    Intake/Output:   Intake/Output Summary (Last 24 hours) at 03/22/2021 0808 Last data filed at 03/21/2021 1700 Gross per 24 hour  Intake 480 ml  Output --  Net 480 ml      Physical Exam    PHYSICAL EXAM: General:  Well appearing, thin WM. No respiratory difficulty HEENT: normal Neck: supple. JVD 8 cm. Carotids 2+ bilat; no bruits. No lymphadenopathy or thyromegaly appreciated. Cor: PMI nondisplaced. Regular rate & rhythm. No rubs, gallops or murmurs. Lungs: clear Abdomen: soft, nontender, nondistended. No hepatosplenomegaly. No bruits or masses. Good bowel sounds. Extremities: no cyanosis, clubbing, rash, edema + RUE Fistula + trill  Neuro: alert & oriented x 3, cranial nerves grossly intact. moves all 4 extremities w/o difficulty. Affect pleasant.   Telemetry   Sinus brady, upper 50s (personally reviewed)  EKG    No new EKG to review   Labs    CBC Recent Labs    03/21/21 0125 03/22/21 0636  WBC 6.4 8.0  HGB 10.8* 10.3*  HCT 34.4* 32.6*  MCV 100.9* 101.2*  PLT 162 0000000   Basic Metabolic Panel Recent Labs    03/20/21 0019 03/21/21 0125 03/22/21 0636  NA 134* 134*  --   K 4.0 3.8  --   CL 99 97*  --   CO2 27 28  --   GLUCOSE 148* 141*  --   BUN 31* 18  --  CREATININE 5.21* 3.60* 5.37*  CALCIUM 8.3* 8.6*  --   PHOS  --  3.3  --    Liver Function Tests Recent Labs    03/21/21 0125  ALBUMIN 3.3*   No results for input(s): LIPASE, AMYLASE in the last 72 hours. Cardiac Enzymes No results for input(s): CKTOTAL, CKMB, CKMBINDEX, TROPONINI in the last 72 hours.  BNP: BNP (last 3 results) Recent Labs    02/21/2021 1545  BNP 1,127.8*    ProBNP (last 3 results) No results for input(s): PROBNP in the last 8760 hours.   D-Dimer No results for input(s): DDIMER in the last 72 hours. Hemoglobin A1C No results for input(s): HGBA1C in the last 72 hours. Fasting Lipid Panel No results for input(s): CHOL, HDL, LDLCALC, TRIG, CHOLHDL,  LDLDIRECT in the last 72 hours. Thyroid Function Tests No results for input(s): TSH, T4TOTAL, T3FREE, THYROIDAB in the last 72 hours.  Invalid input(s): FREET3  Other results:   Imaging    No results found.   Medications:     Scheduled Medications: . aspirin EC  81 mg Oral Daily  . atorvastatin  40 mg Oral Daily  . Chlorhexidine Gluconate Cloth  6 each Topical Q0600  . Chlorhexidine Gluconate Cloth  6 each Topical Q0600  . cinacalcet  30 mg Oral Q T,Th,Sa-HD  . [START ON 03/25/2021] darbepoetin (ARANESP) injection - DIALYSIS  60 mcg Intravenous Q Thu-HD  . enoxaparin (LOVENOX) injection  30 mg Subcutaneous Q24H  . insulin aspart  0-9 Units Subcutaneous TID WC  . metoprolol succinate  50 mg Oral QPM  . sildenafil  20 mg Oral TID  . sodium chloride flush  3 mL Intravenous Q12H  . sodium chloride flush  3 mL Intravenous Q12H  . sodium chloride flush  3 mL Intravenous Q12H    Infusions: . sodium chloride    . sodium chloride      PRN Medications: sodium chloride, sodium chloride, acetaminophen, lidocaine (PF), loperamide, nitroGLYCERIN, ondansetron (ZOFRAN) IV, sodium chloride flush, sodium chloride flush     Assessment/Plan   1. CAD: Severe 3VD with decreased EF.  I reviewed films with Dr. Ellyn Hack, PCI would be possible but would be difficult/high risk with multiple lesions and heavy calcification.  I think that best option is still going to be CABG, will be at higher post-op bleeding risk with cirrhosis.  Moderate RV dysfunction by echo is also concerning (with high RA pressure on RHC), but PAPI adequate at 2.8. No chest pain.  - Continue ASA 81.  - Continue atorvastatin 40 mg daily.  - Continue Toprol XL 50 mg daily.  - Plan CABG tomorrow w/ Dr. Orvan Seen   2. Acute/chronic HF with mid range EF: Suspect ischemic cardiomyopathy.  Echo 4/22 with EF 40-45% with moderately decreased RV systolic function and PASP 99, moderate TR. There is a prominent component of RV failure  with RA pressure elevated out of proportion to PCWP (CVP/PCWP 0.875 on RHC) but PAPI adequate at 2.8. Discussed with renal, plan to lower dry weight to get some extra fluid off him pre-CABG.  - Continue Toprol XL 50 mg daily.  - HD for volume management per nephrology.  3. ESRD: Suspected due to diabetes.  HD TTS. Increasing volume removal at HD.  4. 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  The CO was not markedly high.  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 Oxygen saturation is 99% on RA,  so no evidence for hepatopulmonary syndrome.   - Sildenafil 20 mg tid to continue.   5. Cirrhosis: Noted on abdominal US from 2020.  H/o paracentesis.  Had workup at [facility] (though patient does not remember this), viral hepatitis labs negative.  They ended up think that the cirrhosis was cardiogenic.  Repeat abdominal US 5/23 w/ nodular hepatic parenchymal pattern with increased echogenicity consistent with the patient's known cirrhosis. No focal hepatic abnormality identified. Portal vein is patent. Moderate ascites. Gallstones noted. LFTs normal. Last INR 1.3.  Suspect this will increase risk of surgical bleeding with CABG but not prohibitively.  GI has seen, patient had 4L paracentesis on 5/26.   Per nephrology, plan is to undertake dialysis today off schedule to allow for optimization of surgery tomorrow.  We will follow post CABG.   Length of Stay: [address], PA-C  03/22/2021, 8:08 AM  Advanced Heart Failure Team Pager [phone] (M-F; 7a - 5p)  Please contact [facility] for night-coverage after hours (5p -7a ) and weekends on [email or web address]  Patient seen and examined with the above-signed Advanced Practice Provider and/or Housestaff. I personally reviewed laboratory data, imaging studies and relevant notes. I independently examined the patient and formulated the important aspects  of the plan. I have edited the note to reflect any of my changes or salient points. I have personally discussed the plan with the patient and/or family.  Stable today. No CP or SOB. For HD today.   General:  Well appearing. No resp difficulty HEENT: normal Neck: supple. Mild JVD. Carotids 2+ bilat; no bruits. No lymphadenopathy or thryomegaly appreciated. Cor: PMI nondisplaced. Regular rate & rhythm. No rubs, gallops or murmurs. Lungs: clear Abdomen: soft, nontender, nondistended. No hepatosplenomegaly. No bruits or masses. Good bowel sounds. Extremities: no cyanosis, clubbing, rash, edema Neuro: alert & orientedx3, cranial nerves grossly intact. moves all 4 extremities w/o difficulty. Affect pleasant  For HD today. CABG tomorrow   Glori Bickers, MD  10:36 AM

## 2021-03-22 NOTE — Plan of Care (Signed)
  Problem: Education: Goal: Knowledge of General Education information will improve Description Including pain rating scale, medication(s)/side effects and non-pharmacologic comfort measures Outcome: Progressing   

## 2021-03-22 NOTE — Progress Notes (Signed)
Patient ID: David Rose., male   DOB: [birth year], 55 y.o.   MRN: [id number] [facility] Progress Note   Assessment/ Plan:   1.  Coronary artery disease: With previously documented severe three-vessel disease and decreased ejection fraction with plans for coronary artery bypass grafting tomorrow (5/31) after determined not amenable to endovascular treatment. 2.  End-stage renal disease: He is usually on a Tuesday/Thursday/Saturday dialysis schedule via right upper arm AV fistula and the plan is to undertake dialysis today off schedule to allow for optimization of surgery tomorrow. 3.  Pulmonary hypertension/chronic systolic heart failure: He appears to have tolerated ultrafiltration with hemodialysis well so far and will get additional dialysis today to help further optimization for surgery tomorrow. 4.  Anemia of chronic kidney disease: Hemoglobin and hematocrit within goal, continue to treat with ESA and monitor for overt losses. 5.  Chronic kidney disease/metabolic bone disease: Calcium and phosphorus level within acceptable range 3 times weekly Sensipar for PTH control. 6.  History of cirrhosis with recurrent ascites: Paracentesis periodically.  Subjective:   Denies any acute events overnight, understands plan for dialysis today.   Objective:   BP 110/61 (BP Location: Left Arm)   Pulse (!) 59   Temp 97.6 F (36.4 C) (Oral)   Resp 14   Wt 73.5 kg   SpO2 98%   BMI 20.80 kg/m   Intake/Output Summary (Last 24 hours) at 03/22/2021 0743 Last data filed at 03/21/2021 1700 Gross per 24 hour  Intake 720 ml  Output --  Net 720 ml   Weight change: 0.4 kg  Physical Exam: Gen: Comfortably resting in bed, watching television CVS: Pulse regular bradycardia, S1 and S2 normal Resp: Clear to auscultation bilaterally, no rales/rhonchi Abd: Soft, flat, nontender Ext: No lower extremity edema, right upper arm AV fistula with poor augmentation and low pitched  bruit  Imaging: No results found.  Labs: BMET Recent Labs  Lab 02/26/2021 1545 02/26/2021 0147 03/06/2021 1258 02/23/2021 1259 03/21/2021 1430 03/01/2021 0058 03/18/21 0040 03/19/21 0042 03/20/21 0019 03/21/21 0125 03/22/21 0636  NA 135 134* 138 138  --  135 132* 137 134* 134*  --   K 3.6 3.6 3.9 3.9  --  3.7 4.5 3.8 4.0 3.8  --   CL 98 97*  --   --   --  98 95* 98 99 97*  --   CO2 26 28  --   --   --  '29 25 31 27 28  '$ --   GLUCOSE 127* 104*  --   --   --  92 108* 139* 148* 141*  --   BUN 23* 27*  --   --   --  13 27* 19 31* 18  --   CREATININE 5.27* 5.75*  --   --  5.84* 3.76* 5.28* 3.83* 5.21* 3.60* 5.37*  CALCIUM 7.8* 8.1*  --   --   --  8.0* 8.0* 8.3* 8.3* 8.6*  --   PHOS  --   --   --   --   --  4.8*  --   --   --  3.3  --    CBC Recent Labs  Lab 02/27/2021 1545 03/08/2021 1258 03/19/21 0042 03/20/21 0019 03/21/21 0125 03/22/21 0636  WBC 8.0   < > 5.6 5.7 6.4 8.0  NEUTROABS 5.2  --   --   --   --   --   HGB 9.9*   < > 9.6* 10.3* 10.8* 10.3*  HCT 31.6*   < > 30.3* 32.7* 34.4* 32.6*  MCV 100.3*   < > 101.3* 100.6* 100.9* 101.2*  PLT 169   < > 144* 141* 162 178   < > = values in this interval not displayed.   Medications:    . aspirin EC  81 mg Oral Daily  . atorvastatin  40 mg Oral Daily  . Chlorhexidine Gluconate Cloth  6 each Topical Q0600  . Chlorhexidine Gluconate Cloth  6 each Topical Q0600  . cinacalcet  30 mg Oral Q T,Th,Sa-HD  . [START ON 03/25/2021] darbepoetin (ARANESP) injection - DIALYSIS  60 mcg Intravenous Q Thu-HD  . enoxaparin (LOVENOX) injection  30 mg Subcutaneous Q24H  . insulin aspart  0-9 Units Subcutaneous TID WC  . metoprolol succinate  50 mg Oral QPM  . sildenafil  20 mg Oral TID  . sodium chloride flush  3 mL Intravenous Q12H  . sodium chloride flush  3 mL Intravenous Q12H  . sodium chloride flush  3 mL Intravenous Q12H   Elmarie Shiley, MD 03/22/2021, 7:43 AM

## 2021-03-22 NOTE — Progress Notes (Signed)
Mobility Specialist: Progress Note   03/22/21 1202  Mobility  Activity Ambulated in hall  Level of Assistance Independent after set-up  Assistive Device Front wheel walker  Distance Ambulated (ft) 300 ft  Mobility Ambulated independently in hallway  Mobility Response Tolerated well  Mobility performed by Mobility specialist  $Mobility charge 1 Mobility   Pre-Mobility: 61 HR During Mobility: 67 HR Post-Mobility: 62 HR, 99/55 (68) BP, 97% SpO2  Pt c/o feeling a little light headed during ambulation but said he always feels this way when he is up, otherwise asx. Pt back to bed after walk with family members present in room. RN notified of low BP.   [facility] David Rose Mobility Specialist Mobility Specialist Phone: [phone]

## 2021-03-23 ENCOUNTER — Inpatient Hospital Stay (HOSPITAL_COMMUNITY): Payer: Medicare Other | Admitting: Anesthesiology

## 2021-03-23 ENCOUNTER — Inpatient Hospital Stay (HOSPITAL_COMMUNITY): Admission: RE | Disposition: E | Payer: Self-pay | Source: Ambulatory Visit | Attending: Cardiothoracic Surgery

## 2021-03-23 ENCOUNTER — Inpatient Hospital Stay (HOSPITAL_COMMUNITY): Payer: Medicare Other

## 2021-03-23 DIAGNOSIS — Z9889 Other specified postprocedural states: Secondary | ICD-10-CM

## 2021-03-23 DIAGNOSIS — Z951 Presence of aortocoronary bypass graft: Secondary | ICD-10-CM

## 2021-03-23 HISTORY — PX: CORONARY ARTERY BYPASS GRAFT: SHX141

## 2021-03-23 HISTORY — PX: TEE WITHOUT CARDIOVERSION: SHX5443

## 2021-03-23 LAB — CBC
HCT: 33.8 % — ABNORMAL LOW (ref 39.0–52.0)
HCT: 29.7 % — ABNORMAL LOW (ref 39.0–52.0)
HCT: 20.6 % — ABNORMAL LOW (ref 39.0–52.0)
Hemoglobin: 10.6 g/dL — ABNORMAL LOW (ref 13.0–17.0)
Hemoglobin: 9.5 g/dL — ABNORMAL LOW (ref 13.0–17.0)
Hemoglobin: 6.4 g/dL — CL (ref 13.0–17.0)
MCH: 31.6 pg (ref 26.0–34.0)
MCH: 32.5 pg (ref 26.0–34.0)
MCH: 32 pg (ref 26.0–34.0)
MCHC: 31.4 g/dL (ref 30.0–36.0)
MCHC: 32 g/dL (ref 30.0–36.0)
MCHC: 31.1 g/dL (ref 30.0–36.0)
MCV: 100.9 fL — ABNORMAL HIGH (ref 80.0–100.0)
MCV: 101.7 fL — ABNORMAL HIGH (ref 80.0–100.0)
MCV: 103 fL — ABNORMAL HIGH (ref 80.0–100.0)
Platelets: 188 10*3/uL (ref 150–400)
Platelets: 158 10*3/uL (ref 150–400)
Platelets: 248 10*3/uL (ref 150–400)
RBC: 3.35 MIL/uL — ABNORMAL LOW (ref 4.22–5.81)
RBC: 2.92 MIL/uL — ABNORMAL LOW (ref 4.22–5.81)
RBC: 2 MIL/uL — ABNORMAL LOW (ref 4.22–5.81)
RDW: 13.2 % (ref 11.5–15.5)
RDW: 13.4 % (ref 11.5–15.5)
RDW: 14.5 % (ref 11.5–15.5)
WBC: 8.2 10*3/uL (ref 4.0–10.5)
WBC: 16.7 10*3/uL — ABNORMAL HIGH (ref 4.0–10.5)
WBC: 21.3 10*3/uL — ABNORMAL HIGH (ref 4.0–10.5)
nRBC: 0 % (ref 0.0–0.2)
nRBC: 0 % (ref 0.0–0.2)
nRBC: 0 % (ref 0.0–0.2)

## 2021-03-23 LAB — BASIC METABOLIC PANEL
Anion gap: 10 (ref 5–15)
Anion gap: 14 (ref 5–15)
BUN: 15 mg/dL (ref 6–20)
BUN: 17 mg/dL (ref 6–20)
CO2: 27 mmol/L (ref 22–32)
CO2: 19 mmol/L — ABNORMAL LOW (ref 22–32)
Calcium: 8.4 mg/dL — ABNORMAL LOW (ref 8.9–10.3)
Calcium: 7.4 mg/dL — ABNORMAL LOW (ref 8.9–10.3)
Chloride: 97 mmol/L — ABNORMAL LOW (ref 98–111)
Chloride: 105 mmol/L (ref 98–111)
Creatinine, Ser: 3.63 mg/dL — ABNORMAL HIGH (ref 0.61–1.24)
Creatinine, Ser: 3.5 mg/dL — ABNORMAL HIGH (ref 0.61–1.24)
GFR, Estimated: 19 mL/min — ABNORMAL LOW (ref >60)
GFR, Estimated: 20 mL/min — ABNORMAL LOW (ref >60)
Glucose, Bld: 112 mg/dL — ABNORMAL HIGH (ref 70–99)
Glucose, Bld: 158 mg/dL — ABNORMAL HIGH (ref 70–99)
Potassium: 3.3 mmol/L — ABNORMAL LOW (ref 3.5–5.1)
Potassium: 3.4 mmol/L — ABNORMAL LOW (ref 3.5–5.1)
Sodium: 134 mmol/L — ABNORMAL LOW (ref 135–145)
Sodium: 138 mmol/L (ref 135–145)

## 2021-03-23 LAB — PREPARE RBC (CROSSMATCH)

## 2021-03-23 LAB — POCT I-STAT EG7
Acid-Base Excess: 3 mmol/L — ABNORMAL HIGH (ref 0.0–2.0)
Bicarbonate: 27.6 mmol/L (ref 20.0–28.0)
Calcium, Ion: 1.06 mmol/L — ABNORMAL LOW (ref 1.15–1.40)
HCT: 26 % — ABNORMAL LOW (ref 39.0–52.0)
Hemoglobin: 8.8 g/dL — ABNORMAL LOW (ref 13.0–17.0)
O2 Saturation: 87 %
Potassium: 3.2 mmol/L — ABNORMAL LOW (ref 3.5–5.1)
Sodium: 138 mmol/L (ref 135–145)
TCO2: 29 mmol/L (ref 22–32)
pCO2, Ven: 44 mmHg (ref 44.0–60.0)
pH, Ven: 7.406 (ref 7.250–7.430)
pO2, Ven: 53 mmHg — ABNORMAL HIGH (ref 32.0–45.0)

## 2021-03-23 LAB — POCT I-STAT 7, (LYTES, BLD GAS, ICA,H+H)
Acid-Base Excess: 5 mmol/L — ABNORMAL HIGH (ref 0.0–2.0)
Acid-Base Excess: 1 mmol/L (ref 0.0–2.0)
Acid-Base Excess: 4 mmol/L — ABNORMAL HIGH (ref 0.0–2.0)
Acid-Base Excess: 2 mmol/L (ref 0.0–2.0)
Bicarbonate: 30.4 mmol/L — ABNORMAL HIGH (ref 20.0–28.0)
Bicarbonate: 26.6 mmol/L (ref 20.0–28.0)
Bicarbonate: 28 mmol/L (ref 20.0–28.0)
Bicarbonate: 27.4 mmol/L (ref 20.0–28.0)
Calcium, Ion: 1.16 mmol/L (ref 1.15–1.40)
Calcium, Ion: 1.01 mmol/L — ABNORMAL LOW (ref 1.15–1.40)
Calcium, Ion: 1.06 mmol/L — ABNORMAL LOW (ref 1.15–1.40)
Calcium, Ion: 0.94 mmol/L — ABNORMAL LOW (ref 1.15–1.40)
HCT: 34 % — ABNORMAL LOW (ref 39.0–52.0)
HCT: 27 % — ABNORMAL LOW (ref 39.0–52.0)
HCT: 26 % — ABNORMAL LOW (ref 39.0–52.0)
HCT: 31 % — ABNORMAL LOW (ref 39.0–52.0)
Hemoglobin: 11.6 g/dL — ABNORMAL LOW (ref 13.0–17.0)
Hemoglobin: 9.2 g/dL — ABNORMAL LOW (ref 13.0–17.0)
Hemoglobin: 8.8 g/dL — ABNORMAL LOW (ref 13.0–17.0)
Hemoglobin: 10.5 g/dL — ABNORMAL LOW (ref 13.0–17.0)
O2 Saturation: 100 %
O2 Saturation: 100 %
O2 Saturation: 100 %
O2 Saturation: 100 %
Potassium: 3.2 mmol/L — ABNORMAL LOW (ref 3.5–5.1)
Potassium: 3.2 mmol/L — ABNORMAL LOW (ref 3.5–5.1)
Potassium: 4.9 mmol/L (ref 3.5–5.1)
Potassium: 3.8 mmol/L (ref 3.5–5.1)
Sodium: 137 mmol/L (ref 135–145)
Sodium: 141 mmol/L (ref 135–145)
Sodium: 139 mmol/L (ref 135–145)
Sodium: 139 mmol/L (ref 135–145)
TCO2: 32 mmol/L (ref 22–32)
TCO2: 28 mmol/L (ref 22–32)
TCO2: 29 mmol/L (ref 22–32)
TCO2: 29 mmol/L (ref 22–32)
pCO2 arterial: 50.1 mmHg — ABNORMAL HIGH (ref 32.0–48.0)
pCO2 arterial: 45.4 mmHg (ref 32.0–48.0)
pCO2 arterial: 37.1 mmHg (ref 32.0–48.0)
pCO2 arterial: 43.9 mmHg (ref 32.0–48.0)
pH, Arterial: 7.392 (ref 7.350–7.450)
pH, Arterial: 7.376 (ref 7.350–7.450)
pH, Arterial: 7.486 — ABNORMAL HIGH (ref 7.350–7.450)
pH, Arterial: 7.404 (ref 7.350–7.450)
pO2, Arterial: 531 mmHg — ABNORMAL HIGH (ref 83.0–108.0)
pO2, Arterial: 441 mmHg — ABNORMAL HIGH (ref 83.0–108.0)
pO2, Arterial: 399 mmHg — ABNORMAL HIGH (ref 83.0–108.0)
pO2, Arterial: 512 mmHg — ABNORMAL HIGH (ref 83.0–108.0)

## 2021-03-23 LAB — POCT I-STAT, CHEM 8
BUN: 17 mg/dL (ref 6–20)
BUN: 17 mg/dL (ref 6–20)
BUN: 17 mg/dL (ref 6–20)
BUN: 17 mg/dL (ref 6–20)
Calcium, Ion: 1.12 mmol/L — ABNORMAL LOW (ref 1.15–1.40)
Calcium, Ion: 1.15 mmol/L (ref 1.15–1.40)
Calcium, Ion: 1.06 mmol/L — ABNORMAL LOW (ref 1.15–1.40)
Calcium, Ion: 0.96 mmol/L — ABNORMAL LOW (ref 1.15–1.40)
Chloride: 97 mmol/L — ABNORMAL LOW (ref 98–111)
Chloride: 98 mmol/L (ref 98–111)
Chloride: 98 mmol/L (ref 98–111)
Chloride: 99 mmol/L (ref 98–111)
Creatinine, Ser: 3.7 mg/dL — ABNORMAL HIGH (ref 0.61–1.24)
Creatinine, Ser: 3.8 mg/dL — ABNORMAL HIGH (ref 0.61–1.24)
Creatinine, Ser: 3.5 mg/dL — ABNORMAL HIGH (ref 0.61–1.24)
Creatinine, Ser: 3.6 mg/dL — ABNORMAL HIGH (ref 0.61–1.24)
Glucose, Bld: 139 mg/dL — ABNORMAL HIGH (ref 70–99)
Glucose, Bld: 111 mg/dL — ABNORMAL HIGH (ref 70–99)
Glucose, Bld: 104 mg/dL — ABNORMAL HIGH (ref 70–99)
Glucose, Bld: 151 mg/dL — ABNORMAL HIGH (ref 70–99)
HCT: 31 % — ABNORMAL LOW (ref 39.0–52.0)
HCT: 30 % — ABNORMAL LOW (ref 39.0–52.0)
HCT: 26 % — ABNORMAL LOW (ref 39.0–52.0)
HCT: 26 % — ABNORMAL LOW (ref 39.0–52.0)
Hemoglobin: 10.5 g/dL — ABNORMAL LOW (ref 13.0–17.0)
Hemoglobin: 10.2 g/dL — ABNORMAL LOW (ref 13.0–17.0)
Hemoglobin: 8.8 g/dL — ABNORMAL LOW (ref 13.0–17.0)
Hemoglobin: 8.8 g/dL — ABNORMAL LOW (ref 13.0–17.0)
Potassium: 3.2 mmol/L — ABNORMAL LOW (ref 3.5–5.1)
Potassium: 2.9 mmol/L — ABNORMAL LOW (ref 3.5–5.1)
Potassium: 4.3 mmol/L (ref 3.5–5.1)
Potassium: 3.8 mmol/L (ref 3.5–5.1)
Sodium: 137 mmol/L (ref 135–145)
Sodium: 136 mmol/L (ref 135–145)
Sodium: 137 mmol/L (ref 135–145)
Sodium: 139 mmol/L (ref 135–145)
TCO2: 30 mmol/L (ref 22–32)
TCO2: 28 mmol/L (ref 22–32)
TCO2: 29 mmol/L (ref 22–32)
TCO2: 29 mmol/L (ref 22–32)

## 2021-03-23 LAB — SURGICAL PCR SCREEN
MRSA, PCR: NEGATIVE
Staphylococcus aureus: NEGATIVE

## 2021-03-23 LAB — GLUCOSE, CAPILLARY
Glucose-Capillary: 96 mg/dL (ref 70–99)
Glucose-Capillary: 116 mg/dL — ABNORMAL HIGH (ref 70–99)
Glucose-Capillary: 96 mg/dL (ref 70–99)
Glucose-Capillary: 104 mg/dL — ABNORMAL HIGH (ref 70–99)
Glucose-Capillary: 80 mg/dL (ref 70–99)
Glucose-Capillary: 107 mg/dL — ABNORMAL HIGH (ref 70–99)
Glucose-Capillary: 116 mg/dL — ABNORMAL HIGH (ref 70–99)
Glucose-Capillary: 117 mg/dL — ABNORMAL HIGH (ref 70–99)
Glucose-Capillary: 150 mg/dL — ABNORMAL HIGH (ref 70–99)
Glucose-Capillary: 102 mg/dL — ABNORMAL HIGH (ref 70–99)
Glucose-Capillary: 99 mg/dL (ref 70–99)
Glucose-Capillary: 133 mg/dL — ABNORMAL HIGH (ref 70–99)
Glucose-Capillary: 105 mg/dL — ABNORMAL HIGH (ref 70–99)

## 2021-03-23 LAB — PROTIME-INR
INR: 1.7 — ABNORMAL HIGH (ref 0.8–1.2)
INR: 1.4 — ABNORMAL HIGH (ref 0.8–1.2)
Prothrombin Time: 19.8 seconds — ABNORMAL HIGH (ref 11.4–15.2)
Prothrombin Time: 17.5 seconds — ABNORMAL HIGH (ref 11.4–15.2)

## 2021-03-23 LAB — APTT
aPTT: 37 seconds — ABNORMAL HIGH (ref 24–36)
aPTT: 32 seconds (ref 24–36)

## 2021-03-23 LAB — ECHO INTRAOPERATIVE TEE: Weight: 2518.54 oz

## 2021-03-23 LAB — HEMOGLOBIN AND HEMATOCRIT, BLOOD
HCT: 25.8 % — ABNORMAL LOW (ref 39.0–52.0)
Hemoglobin: 8.3 g/dL — ABNORMAL LOW (ref 13.0–17.0)

## 2021-03-23 LAB — MAGNESIUM: Magnesium: 1.9 mg/dL (ref 1.7–2.4)

## 2021-03-23 LAB — PLATELET COUNT: Platelets: 218 10*3/uL (ref 150–400)

## 2021-03-23 LAB — FIBRINOGEN: Fibrinogen: 166 mg/dL — ABNORMAL LOW (ref 210–475)

## 2021-03-23 SURGERY — CORONARY ARTERY BYPASS GRAFTING (CABG)
Anesthesia: General | Site: Chest

## 2021-03-23 MED ORDER — PROPOFOL 10 MG/ML IV BOLUS
INTRAVENOUS | Status: DC | PRN
  Administered 2021-03-23 (×2): 30 mg via INTRAVENOUS
  Administered 2021-03-23: 70 mg via INTRAVENOUS

## 2021-03-23 MED ORDER — INSULIN REGULAR(HUMAN) IN NACL 100-0.9 UT/100ML-% IV SOLN
INTRAVENOUS | Status: DC | PRN
  Administered 2021-03-23: 1 [IU]/h via INTRAVENOUS

## 2021-03-23 MED ORDER — VANCOMYCIN HCL IN DEXTROSE 1-5 GM/200ML-% IV SOLN
1000.0000 mg | Freq: Once | INTRAVENOUS | Status: DC
  Filled 2021-03-23: qty 200

## 2021-03-23 MED ORDER — EPINEPHRINE HCL 5 MG/250ML IV SOLN IN NS
0.5000 ug/min | INTRAVENOUS | Status: DC
  Administered 2021-03-23: 4 ug/min via INTRAVENOUS
  Administered 2021-03-24 – 2021-03-26 (×2): 2 ug/min via INTRAVENOUS
  Filled 2021-03-23 (×2): qty 250

## 2021-03-23 MED ORDER — SODIUM CHLORIDE 0.9% IV SOLUTION: Freq: Once | INTRAVENOUS | Status: AC

## 2021-03-23 MED ORDER — ALBUMIN HUMAN 5 % IV SOLN
250.0000 mL | INTRAVENOUS | Status: AC | PRN
  Administered 2021-03-23 – 2021-03-24 (×4): 12.5 g via INTRAVENOUS
  Filled 2021-03-23 (×2): qty 250

## 2021-03-23 MED ORDER — SODIUM CHLORIDE 0.9 % IV SOLN
20.0000 ug | INTRAVENOUS | Status: AC
  Administered 2021-03-23: 20 ug via INTRAVENOUS
  Filled 2021-03-23: qty 5

## 2021-03-23 MED ORDER — MAGNESIUM SULFATE 4 GM/100ML IV SOLN: 4.0000 g | Freq: Once | INTRAVENOUS | Status: DC

## 2021-03-23 MED ORDER — BUPIVACAINE HCL (PF) 0.5 % IJ SOLN
INTRAMUSCULAR | Status: AC
  Filled 2021-03-23: qty 30

## 2021-03-23 MED ORDER — SODIUM CHLORIDE 0.9 % IV SOLN: INTRAVENOUS | Status: DC

## 2021-03-23 MED ORDER — NOREPINEPHRINE 4 MG/250ML-% IV SOLN
0.0000 ug/min | INTRAVENOUS | Status: DC
  Administered 2021-03-23: 2 ug/min via INTRAVENOUS

## 2021-03-23 MED ORDER — ACETAMINOPHEN 650 MG RE SUPP
650.0000 mg | Freq: Once | RECTAL | Status: AC
  Administered 2021-03-23: 650 mg via RECTAL

## 2021-03-23 MED ORDER — 0.9 % SODIUM CHLORIDE (POUR BTL) OPTIME
TOPICAL | Status: DC | PRN
  Administered 2021-03-23: 5000 mL

## 2021-03-23 MED ORDER — MORPHINE SULFATE (PF) 2 MG/ML IV SOLN
1.0000 mg | INTRAVENOUS | Status: DC | PRN
  Administered 2021-03-23 – 2021-03-24 (×2): 2 mg via INTRAVENOUS
  Administered 2021-03-24: 4 mg via INTRAVENOUS
  Administered 2021-03-25 – 2021-03-27 (×6): 2 mg via INTRAVENOUS
  Administered 2021-04-02: 4 mg via INTRAVENOUS
  Administered 2021-04-09: 1 mg via INTRAVENOUS
  Administered 2021-04-09 – 2021-04-12 (×6): 2 mg via INTRAVENOUS
  Filled 2021-03-23 (×2): qty 1
  Filled 2021-03-23: qty 2
  Filled 2021-03-23 (×6): qty 1
  Filled 2021-03-23: qty 2
  Filled 2021-03-23 (×6): qty 1
  Filled 2021-03-23: qty 2
  Filled 2021-03-23 (×2): qty 1

## 2021-03-23 MED ORDER — PROTAMINE SULFATE 10 MG/ML IV SOLN
INTRAVENOUS | Status: DC | PRN
  Administered 2021-03-23: 230 mg via INTRAVENOUS
  Administered 2021-03-23: 20 mg via INTRAVENOUS

## 2021-03-23 MED ORDER — VASOPRESSIN 20 UNIT/ML IV SOLN
INTRAVENOUS | Status: AC
  Filled 2021-03-23: qty 1

## 2021-03-23 MED ORDER — MIDAZOLAM HCL 5 MG/5ML IJ SOLN
INTRAMUSCULAR | Status: DC | PRN
  Administered 2021-03-23: 2 mg via INTRAVENOUS
  Administered 2021-03-23: 1 mg via INTRAVENOUS
  Administered 2021-03-23 (×2): 2 mg via INTRAVENOUS

## 2021-03-23 MED ORDER — LACTATED RINGERS IV SOLN: INTRAVENOUS | Status: DC | PRN

## 2021-03-23 MED ORDER — VANCOMYCIN HCL 1000 MG IV SOLR
INTRAVENOUS | Status: DC | PRN
Start: 2021-03-23 — End: 2021-03-23
  Administered 2021-03-23: 3 g

## 2021-03-23 MED ORDER — PHENYLEPHRINE HCL-NACL 20-0.9 MG/250ML-% IV SOLN
0.0000 ug/min | INTRAVENOUS | Status: DC
  Administered 2021-03-23: 80 ug/min via INTRAVENOUS
  Filled 2021-03-23: qty 250

## 2021-03-23 MED ORDER — TRAMADOL HCL 50 MG PO TABS
50.0000 mg | ORAL_TABLET | ORAL | Status: DC | PRN
  Administered 2021-03-25 – 2021-03-27 (×2): 100 mg via ORAL
  Administered 2021-03-29 – 2021-03-30 (×3): 50 mg via ORAL
  Filled 2021-03-23: qty 2
  Filled 2021-03-23 (×2): qty 1
  Filled 2021-03-23: qty 2
  Filled 2021-03-23: qty 1

## 2021-03-23 MED ORDER — SODIUM CHLORIDE 0.9 % IV SOLN: INTRAVENOUS | Status: DC | PRN

## 2021-03-23 MED ORDER — PROPOFOL 10 MG/ML IV BOLUS
INTRAVENOUS | Status: AC
  Filled 2021-03-23: qty 20

## 2021-03-23 MED ORDER — ACETAMINOPHEN 160 MG/5ML PO SOLN
1000.0000 mg | Freq: Four times a day (QID) | ORAL | Status: AC
  Administered 2021-03-24 – 2021-03-28 (×8): 1000 mg
  Filled 2021-03-23 (×10): qty 40.6

## 2021-03-23 MED ORDER — ACETAMINOPHEN 500 MG PO TABS
1000.0000 mg | ORAL_TABLET | Freq: Four times a day (QID) | ORAL | Status: AC
  Administered 2021-03-25 – 2021-03-28 (×8): 1000 mg via ORAL
  Filled 2021-03-23 (×9): qty 2

## 2021-03-23 MED ORDER — HEMOSTATIC AGENTS (NO CHARGE) OPTIME
TOPICAL | Status: DC | PRN
  Administered 2021-03-23: 1 via TOPICAL

## 2021-03-23 MED ORDER — METOPROLOL TARTRATE 25 MG/10 ML ORAL SUSPENSION: 12.5000 mg | Freq: Two times a day (BID) | ORAL | Status: DC

## 2021-03-23 MED ORDER — NITROGLYCERIN IN D5W 200-5 MCG/ML-% IV SOLN: 0.0000 ug/min | INTRAVENOUS | Status: DC

## 2021-03-23 MED ORDER — ROCURONIUM BROMIDE 10 MG/ML (PF) SYRINGE
PREFILLED_SYRINGE | INTRAVENOUS | Status: AC
  Filled 2021-03-23: qty 30

## 2021-03-23 MED ORDER — SODIUM BICARBONATE 8.4 % IV SOLN
50.0000 meq | Freq: Once | INTRAVENOUS | Status: AC
  Administered 2021-03-23: 50 meq via INTRAVENOUS
  Filled 2021-03-23: qty 50

## 2021-03-23 MED ORDER — MILRINONE LACTATE IN DEXTROSE 20-5 MG/100ML-% IV SOLN
0.3750 ug/kg/min | INTRAVENOUS | Status: DC
  Administered 2021-03-24 – 2021-03-25 (×2): 0.375 ug/kg/min via INTRAVENOUS
  Filled 2021-03-23 (×3): qty 100

## 2021-03-23 MED ORDER — DEXMEDETOMIDINE HCL IN NACL 400 MCG/100ML IV SOLN
0.0000 ug/kg/h | INTRAVENOUS | Status: DC
  Administered 2021-03-24: 0.6 ug/kg/h via INTRAVENOUS
  Filled 2021-03-23 (×2): qty 100

## 2021-03-23 MED ORDER — SODIUM CHLORIDE (PF) 0.9 % IJ SOLN
INTRAMUSCULAR | Status: AC
  Filled 2021-03-23: qty 10

## 2021-03-23 MED ORDER — TRANEXAMIC ACID 1000 MG/10ML IV SOLN
INTRAVENOUS | Status: DC | PRN
  Administered 2021-03-23: 1.5 mg/kg/h via INTRAVENOUS

## 2021-03-23 MED ORDER — PHENYLEPHRINE HCL-NACL 20-0.9 MG/250ML-% IV SOLN
INTRAVENOUS | Status: DC | PRN
  Administered 2021-03-23: 25 ug/min via INTRAVENOUS

## 2021-03-23 MED ORDER — PLATELET RICH PLASMA OPTIME
Status: DC | PRN
  Administered 2021-03-23: 10 mL

## 2021-03-23 MED ORDER — SODIUM CHLORIDE 0.9% FLUSH: 10.0000 mL | INTRAVENOUS | Status: DC | PRN

## 2021-03-23 MED ORDER — BISACODYL 10 MG RE SUPP
10.0000 mg | Freq: Every day | RECTAL | Status: DC
  Administered 2021-03-24: 10 mg via RECTAL
  Filled 2021-03-23: qty 1

## 2021-03-23 MED ORDER — DEXMEDETOMIDINE HCL IN NACL 400 MCG/100ML IV SOLN
INTRAVENOUS | Status: DC | PRN
  Administered 2021-03-23: .7 ug/kg/h via INTRAVENOUS

## 2021-03-23 MED ORDER — FENTANYL CITRATE (PF) 250 MCG/5ML IJ SOLN
INTRAMUSCULAR | Status: DC | PRN
  Administered 2021-03-23: 100 ug via INTRAVENOUS
  Administered 2021-03-23: 150 ug via INTRAVENOUS
  Administered 2021-03-23: 50 ug via INTRAVENOUS
  Administered 2021-03-23: 100 ug via INTRAVENOUS
  Administered 2021-03-23: 50 ug via INTRAVENOUS
  Administered 2021-03-23: 150 ug via INTRAVENOUS
  Administered 2021-03-23: 100 ug via INTRAVENOUS
  Administered 2021-03-23: 150 ug via INTRAVENOUS
  Administered 2021-03-23: 100 ug via INTRAVENOUS
  Administered 2021-03-23: 50 ug via INTRAVENOUS
  Administered 2021-03-23: 100 ug via INTRAVENOUS
  Administered 2021-03-23: 150 ug via INTRAVENOUS

## 2021-03-23 MED ORDER — LEVOFLOXACIN IN D5W 750 MG/150ML IV SOLN
750.0000 mg | INTRAVENOUS | Status: AC
  Administered 2021-03-24: 750 mg via INTRAVENOUS
  Filled 2021-03-23: qty 150

## 2021-03-23 MED ORDER — VANCOMYCIN HCL 1000 MG IV SOLR: INTRAVENOUS | Status: DC | PRN

## 2021-03-23 MED ORDER — HEPARIN SODIUM (PORCINE) 1000 UNIT/ML IJ SOLN
INTRAMUSCULAR | Status: DC | PRN
  Administered 2021-03-23: 25000 [IU] via INTRAVENOUS

## 2021-03-23 MED ORDER — FENTANYL CITRATE (PF) 250 MCG/5ML IJ SOLN
INTRAMUSCULAR | Status: AC
  Filled 2021-03-23: qty 5

## 2021-03-23 MED ORDER — PLASMA-LYTE A IV SOLN: INTRAVENOUS | Status: DC

## 2021-03-23 MED ORDER — ARTIFICIAL TEARS OPHTHALMIC OINT
TOPICAL_OINTMENT | OPHTHALMIC | Status: DC | PRN
  Administered 2021-03-23: 1 via OPHTHALMIC

## 2021-03-23 MED ORDER — VANCOMYCIN HCL 1000 MG IV SOLR
INTRAVENOUS | Status: AC
  Filled 2021-03-23: qty 3000

## 2021-03-23 MED ORDER — LACTATED RINGERS IV SOLN: INTRAVENOUS | Status: DC

## 2021-03-23 MED ORDER — PLASMA-LYTE 148 IV SOLN
INTRAVENOUS | Status: DC | PRN
  Administered 2021-03-23: 500 mL

## 2021-03-23 MED ORDER — BUPIVACAINE LIPOSOME 1.3 % IJ SUSP
INTRAMUSCULAR | Status: AC
  Filled 2021-03-23: qty 20

## 2021-03-23 MED ORDER — DOCUSATE SODIUM 100 MG PO CAPS
200.0000 mg | ORAL_CAPSULE | Freq: Every day | ORAL | Status: DC
  Administered 2021-03-25 – 2021-04-01 (×8): 200 mg via ORAL
  Filled 2021-03-23 (×8): qty 2

## 2021-03-23 MED ORDER — SODIUM CHLORIDE 0.45 % IV SOLN: INTRAVENOUS | Status: DC | PRN

## 2021-03-23 MED ORDER — PANTOPRAZOLE SODIUM 40 MG PO TBEC
40.0000 mg | DELAYED_RELEASE_TABLET | Freq: Every day | ORAL | Status: DC
  Administered 2021-03-25 – 2021-04-01 (×8): 40 mg via ORAL
  Filled 2021-03-23 (×8): qty 1

## 2021-03-23 MED ORDER — FAMOTIDINE IN NACL 20-0.9 MG/50ML-% IV SOLN
20.0000 mg | Freq: Two times a day (BID) | INTRAVENOUS | Status: AC
  Administered 2021-03-23 (×2): 20 mg via INTRAVENOUS
  Filled 2021-03-23 (×2): qty 50

## 2021-03-23 MED ORDER — BISACODYL 5 MG PO TBEC
10.0000 mg | DELAYED_RELEASE_TABLET | Freq: Every day | ORAL | Status: DC
  Administered 2021-03-25 – 2021-04-13 (×16): 10 mg via ORAL
  Filled 2021-03-23 (×16): qty 2

## 2021-03-23 MED ORDER — PHENYLEPHRINE 40 MCG/ML (10ML) SYRINGE FOR IV PUSH (FOR BLOOD PRESSURE SUPPORT)
PREFILLED_SYRINGE | INTRAVENOUS | Status: DC | PRN
  Administered 2021-03-23 (×2): 80 ug via INTRAVENOUS

## 2021-03-23 MED ORDER — SODIUM CHLORIDE 0.9% FLUSH
3.0000 mL | INTRAVENOUS | Status: DC | PRN
  Administered 2021-04-08: 3 mL via INTRAVENOUS

## 2021-03-23 MED ORDER — COAGULATION FACTOR VIIA RECOMB 1 MG IV SOLR
2.0000 mg | INTRAVENOUS | Status: AC
  Administered 2021-03-23: 2 mg via INTRAVENOUS
  Filled 2021-03-23: qty 2

## 2021-03-23 MED ORDER — CHLORHEXIDINE GLUCONATE CLOTH 2 % EX PADS
6.0000 | MEDICATED_PAD | Freq: Every day | CUTANEOUS | Status: DC
  Administered 2021-03-23 – 2021-04-08 (×19): 6 via TOPICAL

## 2021-03-23 MED ORDER — ROCURONIUM BROMIDE 10 MG/ML (PF) SYRINGE
PREFILLED_SYRINGE | INTRAVENOUS | Status: DC | PRN
  Administered 2021-03-23: 30 mg via INTRAVENOUS
  Administered 2021-03-23 (×3): 50 mg via INTRAVENOUS
  Administered 2021-03-23: 70 mg via INTRAVENOUS

## 2021-03-23 MED ORDER — EPINEPHRINE HCL 5 MG/250ML IV SOLN IN NS: 0.0000 ug/min | INTRAVENOUS | Status: DC

## 2021-03-23 MED ORDER — ASPIRIN 81 MG PO CHEW
324.0000 mg | CHEWABLE_TABLET | Freq: Every day | ORAL | Status: DC
  Administered 2021-03-24: 324 mg
  Filled 2021-03-23: qty 4

## 2021-03-23 MED ORDER — METOPROLOL TARTRATE 12.5 MG HALF TABLET: 12.5000 mg | ORAL_TABLET | Freq: Two times a day (BID) | ORAL | Status: DC

## 2021-03-23 MED ORDER — ONDANSETRON HCL 4 MG/2ML IJ SOLN
4.0000 mg | Freq: Four times a day (QID) | INTRAMUSCULAR | Status: DC | PRN
  Administered 2021-03-27 – 2021-03-31 (×7): 4 mg via INTRAVENOUS
  Filled 2021-03-23 (×7): qty 2

## 2021-03-23 MED ORDER — MAGNESIUM SULFATE 2 GM/50ML IV SOLN
2.0000 g | Freq: Once | INTRAVENOUS | Status: AC
  Administered 2021-03-23: 2 g via INTRAVENOUS
  Filled 2021-03-23: qty 50

## 2021-03-23 MED ORDER — CALCIUM CHLORIDE 10 % IV SOLN
INTRAVENOUS | Status: DC | PRN
  Administered 2021-03-23 (×2): .25 g via INTRAVENOUS

## 2021-03-23 MED ORDER — POTASSIUM CHLORIDE 10 MEQ/50ML IV SOLN
10.0000 meq | INTRAVENOUS | Status: AC
  Administered 2021-03-23 (×3): 10 meq via INTRAVENOUS

## 2021-03-23 MED ORDER — ACETAMINOPHEN 160 MG/5ML PO SOLN: 650.0000 mg | Freq: Once | ORAL | Status: AC

## 2021-03-23 MED ORDER — METHYLPREDNISOLONE SODIUM SUCC 125 MG IJ SOLR
125.0000 mg | Freq: Four times a day (QID) | INTRAMUSCULAR | Status: AC
  Administered 2021-03-23 – 2021-03-24 (×2): 125 mg via INTRAVENOUS
  Filled 2021-03-23 (×2): qty 2

## 2021-03-23 MED ORDER — LACTATED RINGERS IV SOLN: 500.0000 mL | Freq: Once | INTRAVENOUS | Status: DC | PRN

## 2021-03-23 MED ORDER — INDOCYANINE GREEN 25 MG IV SOLR
INTRAVENOUS | Status: DC | PRN
  Administered 2021-03-23: 1.25 mg via INTRAVENOUS

## 2021-03-23 MED ORDER — DEXTROSE 50 % IV SOLN
0.0000 mL | INTRAVENOUS | Status: DC | PRN
  Administered 2021-03-24: 50 mL via INTRAVENOUS
  Filled 2021-03-23: qty 50

## 2021-03-23 MED ORDER — ARTIFICIAL TEARS OPHTHALMIC OINT
TOPICAL_OINTMENT | OPHTHALMIC | Status: AC
  Filled 2021-03-23: qty 3.5

## 2021-03-23 MED ORDER — SODIUM CHLORIDE 0.9% FLUSH
3.0000 mL | Freq: Two times a day (BID) | INTRAVENOUS | Status: DC
  Administered 2021-03-24 – 2021-04-12 (×20): 3 mL via INTRAVENOUS

## 2021-03-23 MED ORDER — ALBUMIN HUMAN 5 % IV SOLN: INTRAVENOUS | Status: DC | PRN

## 2021-03-23 MED ORDER — ASPIRIN EC 325 MG PO TBEC: 325.0000 mg | DELAYED_RELEASE_TABLET | Freq: Every day | ORAL | Status: DC

## 2021-03-23 MED ORDER — CHLORHEXIDINE GLUCONATE 0.12 % MT SOLN
15.0000 mL | OROMUCOSAL | Status: AC
  Administered 2021-03-23: 15 mL via OROMUCOSAL

## 2021-03-23 MED ORDER — STERILE WATER FOR INJECTION IJ SOLN
INTRAMUSCULAR | Status: AC
  Filled 2021-03-23: qty 10

## 2021-03-23 MED ORDER — OXYCODONE HCL 5 MG PO TABS
5.0000 mg | ORAL_TABLET | ORAL | Status: DC | PRN
  Administered 2021-03-25 (×2): 5 mg via ORAL
  Administered 2021-03-25 (×2): 10 mg via ORAL
  Administered 2021-03-28 (×2): 5 mg via ORAL
  Administered 2021-03-28: 2.5 mg via ORAL
  Administered 2021-03-29 – 2021-04-01 (×9): 5 mg via ORAL
  Filled 2021-03-23: qty 2
  Filled 2021-03-23 (×9): qty 1
  Filled 2021-03-23 (×2): qty 2
  Filled 2021-03-23 (×5): qty 1

## 2021-03-23 MED ORDER — MIDAZOLAM HCL (PF) 10 MG/2ML IJ SOLN
INTRAMUSCULAR | Status: AC
  Filled 2021-03-23: qty 2

## 2021-03-23 MED ORDER — FENTANYL CITRATE (PF) 250 MCG/5ML IJ SOLN
INTRAMUSCULAR | Status: AC
  Filled 2021-03-23: qty 25

## 2021-03-23 MED ORDER — NOREPINEPHRINE 16 MG/250ML-% IV SOLN
0.0000 ug/min | INTRAVENOUS | Status: DC
  Administered 2021-03-23: 25 ug/min via INTRAVENOUS
  Administered 2021-03-24: 10 ug/min via INTRAVENOUS
  Administered 2021-03-26: 8 ug/min via INTRAVENOUS
  Administered 2021-03-27: 17 ug/min via INTRAVENOUS
  Administered 2021-03-28: 20 ug/min via INTRAVENOUS
  Administered 2021-03-28: 18 ug/min via INTRAVENOUS
  Administered 2021-03-29: 14 ug/min via INTRAVENOUS
  Administered 2021-03-30: 3 ug/min via INTRAVENOUS
  Administered 2021-04-01 – 2021-04-02 (×3): 40 ug/min via INTRAVENOUS
  Administered 2021-04-03 – 2021-04-04 (×3): 20 ug/min via INTRAVENOUS
  Administered 2021-04-04: 10 ug/min via INTRAVENOUS
  Administered 2021-04-05: 14 ug/min via INTRAVENOUS
  Administered 2021-04-06: 11 ug/min via INTRAVENOUS
  Administered 2021-04-08: 9 ug/min via INTRAVENOUS
  Administered 2021-04-09: 16 ug/min via INTRAVENOUS
  Administered 2021-04-10: 21 ug/min via INTRAVENOUS
  Administered 2021-04-10: 20 ug/min via INTRAVENOUS
  Administered 2021-04-11: 24 ug/min via INTRAVENOUS
  Administered 2021-04-11: 23 ug/min via INTRAVENOUS
  Administered 2021-04-12: 18 ug/min via INTRAVENOUS
  Administered 2021-04-12: 32 ug/min via INTRAVENOUS
  Administered 2021-04-13: 34 ug/min via INTRAVENOUS
  Administered 2021-04-13: 40 ug/min via INTRAVENOUS
  Administered 2021-04-13: 30 ug/min via INTRAVENOUS
  Administered 2021-04-14: 35 ug/min via INTRAVENOUS
  Administered 2021-04-14: 43 ug/min via INTRAVENOUS
  Administered 2021-04-14: 46 ug/min via INTRAVENOUS
  Administered 2021-04-15: 40 ug/min via INTRAVENOUS
  Filled 2021-03-23 (×36): qty 250

## 2021-03-23 MED ORDER — PLATELET POOR PLASMA OPTIME
Status: DC | PRN
  Administered 2021-03-23: 10 mL

## 2021-03-23 MED ORDER — POTASSIUM CHLORIDE 10 MEQ/50ML IV SOLN
10.0000 meq | INTRAVENOUS | Status: AC
  Administered 2021-03-23 (×2): 10 meq via INTRAVENOUS
  Filled 2021-03-23: qty 50

## 2021-03-23 MED ORDER — THROMBIN 5000 UNITS EX SOLR
INTRAVENOUS | Status: DC | PRN
  Administered 2021-03-23: 2 mL

## 2021-03-23 MED ORDER — SODIUM CHLORIDE 0.9% FLUSH
10.0000 mL | Freq: Two times a day (BID) | INTRAVENOUS | Status: DC
  Administered 2021-03-23 – 2021-03-24 (×2): 10 mL
  Administered 2021-03-25: 20 mL
  Administered 2021-03-25 – 2021-04-14 (×26): 10 mL

## 2021-03-23 MED ORDER — INSULIN REGULAR(HUMAN) IN NACL 100-0.9 UT/100ML-% IV SOLN
INTRAVENOUS | Status: DC
  Administered 2021-03-24: 3.4 [IU]/h via INTRAVENOUS
  Administered 2021-03-24: 10.5 [IU]/h via INTRAVENOUS
  Filled 2021-03-23 (×2): qty 100

## 2021-03-23 MED ORDER — BUPIVACAINE LIPOSOME 1.3 % IJ SUSP
INTRAMUSCULAR | Status: DC | PRN
  Administered 2021-03-23: 50 mL

## 2021-03-23 MED ORDER — SODIUM CHLORIDE 0.9 % IV SOLN: 250.0000 mL | INTRAVENOUS | Status: DC

## 2021-03-23 MED ORDER — MIDAZOLAM HCL 2 MG/2ML IJ SOLN
2.0000 mg | INTRAMUSCULAR | Status: DC | PRN
  Administered 2021-03-24 – 2021-03-25 (×6): 2 mg via INTRAVENOUS
  Filled 2021-03-23 (×6): qty 2

## 2021-03-23 MED ORDER — METOPROLOL TARTRATE 5 MG/5ML IV SOLN
2.5000 mg | INTRAVENOUS | Status: DC | PRN
Start: 2021-03-23 — End: 2021-04-15

## 2021-03-23 SURGICAL SUPPLY — 88 items
ADAPTER CARDIO PERF ANTE/RETRO (ADAPTER) ×3 IMPLANT
APPLICATOR TIP COSEAL (VASCULAR PRODUCTS) IMPLANT
BAG DECANTER FOR FLEXI CONT (MISCELLANEOUS) ×3 IMPLANT
BLADE CLIPPER SURG (BLADE) ×3 IMPLANT
BLADE STERNUM SYSTEM 6 (BLADE) ×3 IMPLANT
BLOWER MISTER CAL-MED (MISCELLANEOUS) ×3 IMPLANT
BNDG ELASTIC 4X5.8 VLCR STR LF (GAUZE/BANDAGES/DRESSINGS) ×3 IMPLANT
BNDG ELASTIC 6X15 VLCR STRL LF (GAUZE/BANDAGES/DRESSINGS) ×3 IMPLANT
BNDG ELASTIC 6X5.8 VLCR STR LF (GAUZE/BANDAGES/DRESSINGS) ×3 IMPLANT
BNDG GAUZE ELAST 4 BULKY (GAUZE/BANDAGES/DRESSINGS) ×3 IMPLANT
CANISTER SUCT 3000ML PPV (MISCELLANEOUS) ×3 IMPLANT
CATH CPB KIT HENDRICKSON (MISCELLANEOUS) ×3 IMPLANT
CATH ROBINSON RED A/P 18FR (CATHETERS) ×6 IMPLANT
CLIP RETRACTION 3.0MM CORONARY (MISCELLANEOUS) ×3 IMPLANT
CLIP VESOCCLUDE SM WIDE 24/CT (CLIP) ×9 IMPLANT
CONTAINER PROTECT SURGISLUSH (MISCELLANEOUS) ×3 IMPLANT
DERMABOND ADVANCED (GAUZE/BANDAGES/DRESSINGS) ×1
DERMABOND ADVANCED .7 DNX12 (GAUZE/BANDAGES/DRESSINGS) ×2 IMPLANT
DRAIN CHANNEL 28F RND 3/8 FF (WOUND CARE) ×9 IMPLANT
DRAPE CARDIOVASCULAR INCISE (DRAPES) ×1
DRAPE SRG 135X102X78XABS (DRAPES) ×2 IMPLANT
DRAPE WARM FLUID 44X44 (DRAPES) ×3 IMPLANT
DRSG AQUACEL AG ADV 3.5X14 (GAUZE/BANDAGES/DRESSINGS) ×3 IMPLANT
ELECT CAUTERY BLADE 6.4 (BLADE) ×3 IMPLANT
ELECT REM PT RETURN 9FT ADLT (ELECTROSURGICAL) ×6
ELECTRODE REM PT RTRN 9FT ADLT (ELECTROSURGICAL) ×4 IMPLANT
FELT TEFLON 1X6 (MISCELLANEOUS) ×6 IMPLANT
GAUZE SPONGE 4X4 12PLY STRL (GAUZE/BANDAGES/DRESSINGS) ×6 IMPLANT
GLOVE NEODERM STRL 7.5 LF PF (GLOVE) ×6 IMPLANT
GLOVE SURG NEODERM 7.5  LF PF (GLOVE) ×3
GOWN STRL REUS W/ TWL LRG LVL3 (GOWN DISPOSABLE) ×8 IMPLANT
GOWN STRL REUS W/TWL LRG LVL3 (GOWN DISPOSABLE) ×4
HEMOSTAT POWDER SURGIFOAM 1G (HEMOSTASIS) ×6 IMPLANT
INSERT FOGARTY XLG (MISCELLANEOUS) ×3 IMPLANT
INSERT SUTURE HOLDER (MISCELLANEOUS) ×3 IMPLANT
KIT BASIN OR (CUSTOM PROCEDURE TRAY) ×3 IMPLANT
KIT DISPOSABLE SPY ELITE (KITS) ×3 IMPLANT
KIT SUCTION CATH 14FR (SUCTIONS) ×3 IMPLANT
KIT TURNOVER KIT B (KITS) ×3 IMPLANT
KIT VASOVIEW HEMOPRO 2 VH 4000 (KITS) ×3 IMPLANT
MARKER GRAFT CORONARY BYPASS (MISCELLANEOUS) ×9 IMPLANT
NEEDLE 18GX1X1/2 (RX/OR ONLY) (NEEDLE) ×3 IMPLANT
NS IRRIG 1000ML POUR BTL (IV SOLUTION) ×15 IMPLANT
PACK E OPEN HEART (SUTURE) ×3 IMPLANT
PACK OPEN HEART (CUSTOM PROCEDURE TRAY) ×3 IMPLANT
PACK SPY-PHI (KITS) IMPLANT
PAD ARMBOARD 7.5X6 YLW CONV (MISCELLANEOUS) ×6 IMPLANT
PAD ELECT DEFIB RADIOL ZOLL (MISCELLANEOUS) ×3 IMPLANT
PENCIL BUTTON HOLSTER BLD 10FT (ELECTRODE) ×3 IMPLANT
POSITIONER HEAD DONUT 9IN (MISCELLANEOUS) ×3 IMPLANT
POWDER SURGICEL 3.0 GRAM (HEMOSTASIS) ×6 IMPLANT
PUNCH AORTIC ROTATE 4.5MM 8IN (MISCELLANEOUS) ×3 IMPLANT
SEALANT PATCH FIBRIN 2X4IN (MISCELLANEOUS) ×3 IMPLANT
SEALANT SURG COSEAL 4ML (VASCULAR PRODUCTS) ×3 IMPLANT
SEALANT SURG COSEAL 8ML (VASCULAR PRODUCTS) ×3 IMPLANT
SET MPS 3-ND DEL (MISCELLANEOUS) ×3 IMPLANT
SUT BONE WAX W31G (SUTURE) ×3 IMPLANT
SUT MNCRL AB 3-0 PS2 18 (SUTURE) ×6 IMPLANT
SUT PDS AB 1 CTX 36 (SUTURE) ×6 IMPLANT
SUT PROLENE 3 0 SH DA (SUTURE) ×6 IMPLANT
SUT PROLENE 5 0 C 1 36 (SUTURE) IMPLANT
SUT PROLENE 6 0 C 1 30 (SUTURE) ×12 IMPLANT
SUT PROLENE 7 0 BV 1 (SUTURE) ×3 IMPLANT
SUT PROLENE 8 0 BV175 6 (SUTURE) IMPLANT
SUT PROLENE BLUE 7 0 (SUTURE) ×3 IMPLANT
SUT SILK  1 MH (SUTURE) ×1
SUT SILK 1 MH (SUTURE) ×2 IMPLANT
SUT SILK 2 0 SH CR/8 (SUTURE) IMPLANT
SUT SILK 3 0 SH CR/8 (SUTURE) IMPLANT
SUT STEEL 6MS V (SUTURE) ×3 IMPLANT
SUT STEEL SZ 6 DBL 3X14 BALL (SUTURE) ×3 IMPLANT
SUT VIC AB 2-0 CTX 27 (SUTURE) IMPLANT
SUT VIC AB 3-0 X1 27 (SUTURE) IMPLANT
SYR 10ML LL (SYRINGE) IMPLANT
SYR 30ML LL (SYRINGE) ×3 IMPLANT
SYR 3ML LL SCALE MARK (SYRINGE) ×3 IMPLANT
SYSTEM SAHARA CHEST DRAIN ATS (WOUND CARE) ×3 IMPLANT
TAPE CLOTH SURG 4X10 WHT LF (GAUZE/BANDAGES/DRESSINGS) ×3 IMPLANT
TAPE PAPER 2X10 WHT MICROPORE (GAUZE/BANDAGES/DRESSINGS) ×3 IMPLANT
TOWEL GREEN STERILE (TOWEL DISPOSABLE) ×3 IMPLANT
TOWEL GREEN STERILE FF (TOWEL DISPOSABLE) ×3 IMPLANT
TRAY FOLEY SLVR 16FR TEMP STAT (SET/KITS/TRAYS/PACK) ×3 IMPLANT
TUBING ART PRESS 72  MALE/FEM (TUBING) ×2
TUBING ART PRESS 72 MALE/FEM (TUBING) ×4 IMPLANT
TUBING LAP HI FLOW INSUFFLATIO (TUBING) ×3 IMPLANT
UNDERPAD 30X36 HEAVY ABSORB (UNDERPADS AND DIAPERS) ×3 IMPLANT
WATER STERILE IRR 1000ML POUR (IV SOLUTION) ×6 IMPLANT
WATER STERILE IRR 1000ML UROMA (IV SOLUTION) IMPLANT

## 2021-03-23 NOTE — Progress Notes (Signed)
Patient ID: David Piersma., male   DOB: [birth year], 55 y.o.   MRN: [id number] [facility] Progress Note   Assessment/ Plan:   1.  Coronary artery disease: With previously documented severe three-vessel disease and decreased ejection fraction and today had four-vessel CABG by Dr. Orvan Seen, stable postoperatively. 2.  End-stage renal disease: He is usually on a Tuesday/Thursday/Saturday dialysis schedule via right upper arm AV fistula and underwent hemodialysis yesterday in anticipation of surgical schedule for today.  I will order for hemodialysis again tomorrow with cautious ultrafiltration based on current blood pressure. 3.  Pulmonary hypertension/chronic systolic heart failure: On Neo-Synephrine postoperatively and getting intermittent boluses of albumin and status post FFP; monitor for volume overload. 4.  Anemia of chronic kidney disease: Hemoglobin and hematocrit within goal, continue to treat with ESA and monitor for overt losses. 5.  Chronic kidney disease/metabolic bone disease: Calcium and phosphorus level within acceptable range 3 times weekly Sensipar for PTH control. 6.  History of cirrhosis with recurrent ascites: Paracentesis periodically.  Subjective:   Underwent CABG earlier this morning, noted to be hypotensive postoperatively on Neo-Synephrine drip.   Objective:   BP (!) 107/26   Pulse 80   Temp (!) 96.44 F (35.8 C) (Core)   Resp 11   Ht '6\' 2"'$  (1.88 m)   Wt 71.4 kg   SpO2 100%   BMI 20.21 kg/m   Intake/Output Summary (Last 24 hours) at 02/21/2021 1542 Last data filed at 03/08/2021 1507 Gross per 24 hour  Intake 5391.61 ml  Output 5460 ml  Net -68.39 ml   Weight change: -2.1 kg  Physical Exam: Gen: Intubated, appears comfortable, sedation weaned off CVS: Pulse regular rhythm, normal rate, midline sternal incision, distant heart sounds Resp: Clear to auscultation bilaterally, no rales/rhonchi over anterior chest Abd: Soft, flat, nontender Ext:  No lower extremity edema, right upper arm AV fistula with poor augmentation and low pitched bruit  Imaging: DG Chest Port 1 View  Result Date: 03/22/2021 CLINICAL DATA:  Status post CABG x4. EXAM: PORTABLE CHEST 1 VIEW COMPARISON:  Mar 22, 2021. FINDINGS: Status post CABG with median sternotomy. Endotracheal tube tip at the level of the clavicular heads. Right IJ approach Swan-Ganz catheter with the tip projecting at the expected location of the main pulmonary artery. Bilateral chest tubes with tips projecting along the lateral lungs bilaterally. Gastric tube courses below the diaphragm with the tip outside the view. Side port projects at the expected location of the stomach. Mediastinal drain. Opacity along the lateral left midlung in the region of the left chest tube tip, which may reflect contusion or atelectasis. No visible pleural effusions or pneumothorax on this single AP semi-erect radiograph. IMPRESSION: 1. Status post CABG with support devices as detailed above. 2. Opacity along the lateral left midlung in the region of the left chest tube tip, which may reflect contusion or atelectasis. Recommend attention on follow-up Electronically Signed   By: Margaretha Sheffield MD   On: 03/05/2021 13:18   DG Chest Port 1 View  Result Date: 03/22/2021 CLINICAL DATA:  Chest pain EXAM: PORTABLE CHEST 1 VIEW COMPARISON:  03/22/2021 FINDINGS: Cardiac shadow is at the upper limits of normal in size. Aortic calcifications are noted. Lungs are well aerated bilaterally. No focal infiltrate or effusion is seen. No bony abnormality is noted. Upper abdomen appears within normal limits. IMPRESSION: No acute abnormality noted. Aortic Atherosclerosis (ICD10-I70.0). Electronically Signed   By: Inez Catalina M.D.   On: 03/22/2021 17:08  ECHO INTRAOPERATIVE TEE  Result Date: 03/03/2021  *INTRAOPERATIVE TRANSESOPHAGEAL REPORT *  Patient Name:   David Whitted. Date of Exam: 03/01/2021 Medical Rec #:  [id number]            Height:       74.0 in Accession #:    [id number]          Weight:       157.4 lb Date of Birth:  July 05, 1966            BSA:          1.96 m Patient Age:    55 years            BP:           112/54 mmHg Patient Gender: M                   HR:           54 bpm. Exam Location:  Anesthesiology Transesophogeal exam was perform intraoperatively during surgical procedure. Patient was closely monitored under general anesthesia during the entirety of examination. Indications:     CAD Sonographer:     Dustin Flock RDCS Performing Phys: Suella Broad MD Diagnosing Phys: Suella Broad MD Complications: No known complications during this procedure. POST-OP IMPRESSIONS     s/p CABG x4 - Left Ventricle: LVEF 40 - 45%, diffuse hypokinesis with no RWMA's. CO > 4L/min, CI > 2.5L/min/m2. - Right Ventricle: Mildly improved with vasopressor support. - Aorta: No dissection noted after cannula removed. Stable plaque burden. - Left Atrium: The left atrium appears unchanged from pre-bypass.  - Aortic Valve: The aortic valve appears unchanged from pre-bypass. - Mitral Valve: The mitral valve appears unchanged from pre-bypass. - Tricuspid Valve: The tricuspid valve appears unchanged from pre-bypass.  PRE-OP FINDINGS  Left Ventricle: The left ventricle has mild-moderately reduced systolic function, with an ejection fraction of 40-45%. The cavity size was mildly dilated. The left ventricular wall thickness was not assessed. Left ventrical global hypokinesis without regional wall motion abnormalities. There is no left ventricular hypertrophy. Left ventricular diastolic function could not be evaluated. Right Ventricle: The right ventricle has mildly reduced systolic function. The cavity was dialated. There is no increase in right ventricular wall thickness. There is no aneurysm seen. Left Atrium: Left atrial size was normal in size. No left atrial/left atrial appendage thrombus was detected. The left atrial appendage is well visualized and  there is no evidence of thrombus present. Right Atrium: Right atrial size was normal in size. PA catheter traversing the right atrium into the right ventricle. Interatrial Septum: No atrial level shunt detected by color flow Doppler. There is no evidence of a patent foramen ovale. Pericardium: There is no evidence of pericardial effusion. There is a pleural effusion in the left lateral region. Mitral Valve: The mitral valve is normal in structure. Mitral valve regurgitation is trivial by color flow Doppler. The MR jet is centrally-directed. There is no evidence of mitral valve vegetation. There is No evidence of mitral stenosis. Tricuspid Valve: The tricuspid valve was normal in structure. Tricuspid valve regurgitation is trivial by color flow Doppler. No evidence of tricuspid stenosis is present. There is no evidence of tricuspid valve vegetation. Aortic Valve: The aortic valve is tricuspid Aortic valve regurgitation was not visualized by color flow Doppler. There is no stenosis of the aortic valve. There is no evidence of aortic valve vegetation. There is moderate thickening and mild calcification present on the aortic valve  right coronary, left coronary and non-coronary cusps with normal mobility. Pulmonic Valve: The pulmonic valve was normal in structure, with normal. No evidence of pumonic stenosis. Pulmonic valve regurgitation is not visualized by color flow Doppler. Aorta: The aortic root, ascending aorta and aortic arch are normal in size and structure. There is evidence of plaque in the descending aorta; Grade II, measuring 2-21m in size. Pulmonary Artery: SGordy Councilmancatheter present on the right. The pulmonary artery is of normal size. Pulmonary hypertension is moderate. Venous: The inferior vena cava was not well visualized. Shunts: There is no evidence of an atrial septal defect.  KSuella BroadMD Electronically signed by KSuella BroadMD Signature Date/Time: 03/13/2021/1:43:52 PM    Final      Labs: BMET Recent Labs  Lab 03/21/2021 [id number] 03/19/21 [id number]/[id number] 0019 03/21/21 0125 03/22/21 [id number] 1631 03/13/2021 0430 03/04/2021 [id number] 03/12/2021 0913 03/05/2021 0933 03/06/2021 [id number] 1004 02/26/2021 1038 02/25/2021 1125 03/10/2021 1134  NA 135 132* 137 134* 134*  --  134* 134* 137   < > 136 141 138 137 139 139 139  K 3.7 4.5 3.8 4.0 3.8  --  3.9 3.3* 3.2*   < > 2.9* 3.2* 3.2* 4.3 4.9 3.8 3.8  CL 98 95* 98 99 97*  --  97* 97* 97*  --  98  --   --  98  --  99  --   CO2 '29 25 31 27 28  '$ --  27 27  --   --   --   --   --   --   --   --   --   GLUCOSE 92 108* 139* 148* 141*  --  134* 112* 139*  --  111*  --   --  104*  --  151*  --   BUN 13 27* 19 31* 18  --  32* 15 17  --  17  --   --  17  --  17  --   CREATININE 3.76* 5.28* 3.83* 5.21* 3.60* 5.37* 6.04* 3.63* 3.70*  --  3.80*  --   --  3.50*  --  3.60*  --   CALCIUM 8.0* 8.0* 8.3* 8.3* 8.6*  --  8.6* 8.4*  --   --   --   --   --   --   --   --   --   PHOS 4.8*  --   --   --  3.3  --   --   --   --   --   --   --   --   --   --   --   --    < > = values in this interval not displayed.   CBC Recent Labs  Lab 03/21/21 0125 03/22/21 0636 03/04/2021 0430 03/20/2021 0759 03/13/2021 1018 03/18/2021 1038 02/22/2021 1125 03/04/2021 1134 02/28/2021 1305  WBC 6.4 8.0 8.2  --   --   --   --   --  16.7*  HGB 10.8* 10.3* 10.6*   < > 8.3* 8.8* 8.8* 10.5* 9.5*  HCT 34.4* 32.6* 33.8*   < > 25.8* 26.0* 26.0* 31.0* 29.7*  MCV 100.9* 101.2* 100.9*  --   --   --   --   --  101.7*  PLT 162 178 188  --  218  --   --   --  158   < > = values in this interval not  displayed.   Medications:    . [START ON 04/09/2021] acetaminophen  1,000 mg Oral Q6H   Or  . [START ON 03/31/2021] acetaminophen (TYLENOL) oral liquid 160 mg/5 mL  1,000 mg Per Tube Q6H  . [START ON 04/21/2021] aspirin EC  325 mg Oral Daily   Or  . [START ON 03/26/2021] aspirin  324 mg Per Tube Daily  . atorvastatin  40 mg Oral Daily  . [START ON 04/06/2021]  bisacodyl  10 mg Oral Daily   Or  . [START ON 03/31/2021] bisacodyl  10 mg Rectal Daily  . Chlorhexidine Gluconate Cloth  6 each Topical Daily  . cinacalcet  30 mg Oral Q T,Th,Sa-HD  . [START ON 03/25/2021] darbepoetin (ARANESP) injection - DIALYSIS  60 mcg Intravenous Q Thu-HD  . [START ON 03/26/2021] docusate sodium  200 mg Oral Daily  . metoprolol tartrate  12.5 mg Oral BID   Or  . metoprolol tartrate  12.5 mg Per Tube BID  . [START ON 03/25/2021] pantoprazole  40 mg Oral Daily  . sodium chloride flush  10-40 mL Intracatheter Q12H  . [START ON 04/20/2021] sodium chloride flush  3 mL Intravenous Q12H   Elmarie Shiley, MD 03/03/2021, 3:42 PM

## 2021-03-23 NOTE — Anesthesia Postprocedure Evaluation (Signed)
Anesthesia Post Note  Patient: Avan Hallak.  Procedure(s) Performed: CORONARY ARTERY BYPASS GRAFTING (CABG)X4. LEFT INTERNAL MAMMARY ARTERY. RIGHT ENDOSCOPIC SAPHENOUS VEIN HARVESTING. (N/A Chest) TRANSESOPHAGEAL ECHOCARDIOGRAM (TEE) (N/A ) INDOCYANINE GREEN FLUORESCENCE IMAGING (ICG) (N/A )     Patient location during evaluation: SICU Anesthesia Type: General Level of consciousness: sedated Pain management: pain level controlled Vital Signs Assessment: post-procedure vital signs reviewed and stable Respiratory status: patient remains intubated per anesthesia plan Cardiovascular status: stable Postop Assessment: no apparent nausea or vomiting Anesthetic complications: no   No complications documented.  Last Vitals:  Vitals:   03/22/21 2347 03/18/2021 1315  BP: (!) 112/54   Pulse:  80  Resp: 13 (!) 24  Temp: 36.9 C (!) 34.6 C  SpO2:  100%    Last Pain:  Vitals:   03/18/2021 1315  TempSrc: Core  PainSc:                  Effie Berkshire

## 2021-03-23 NOTE — H&P (Signed)
History and Physical Interval Note:  02/21/2021 7:27 AM  David Rose.  has presented today for surgery, with the diagnosis of CAD.  The various methods of treatment have been discussed with the patient and family. After consideration of risks, benefits and other options for treatment, the patient has consented to  Procedure(s): CORONARY ARTERY BYPASS GRAFTING (CABG) (N/A) TRANSESOPHAGEAL ECHOCARDIOGRAM (TEE) (N/A) INDOCYANINE GREEN FLUORESCENCE IMAGING (ICG) (N/A) as a surgical intervention.  The patient's history has been reviewed, patient examined, no change in status, stable for surgery.  I have reviewed the patient's chart and labs.  Questions were answered to the patient's satisfaction.     Wonda Olds

## 2021-03-23 NOTE — Brief Op Note (Signed)
03/06/2021 - 03/03/2021  10:57 AM  PATIENT:  David Rose.  55 y.o. male  PRE-OPERATIVE DIAGNOSIS:  CAD  POST-OPERATIVE DIAGNOSIS:  CAD  PROCEDURE:  Procedure(s): CORONARY ARTERY BYPASS GRAFTING (CABG)X4. LEFT INTERNAL MAMMARY ARTERY. RIGHT ENDOSCOPIC SAPHENOUS VEIN HARVESTING. (N/A) TRANSESOPHAGEAL ECHOCARDIOGRAM (TEE) (N/A) INDOCYANINE GREEN FLUORESCENCE IMAGING (ICG) (N/A) LIMA-LAD SVG-OM SVG-RCA SVG-DIAG EVH  50 MINUTES  SURGEON:  Surgeon(s) and Role:    * Wonda Olds, MD - Primary  PHYSICIAN ASSISTANT: WAYNE GOLD PA-C  ASSISTANTS: STAFF   ANESTHESIA:   general   BLOOD ADMINISTERED:none  DRAINS: LEFT PLEURAL AND MEDIASTINAL CHEST TUBES   LOCAL MEDICATIONS USED:  EXPAREL  SPECIMEN:  No Specimen  DISPOSITION OF SPECIMEN:  N/A  COUNTS:  YES  TOURNIQUET:  * No tourniquets in log *  DICTATION: .Dragon Dictation  PLAN OF CARE: Admit to inpatient   PATIENT DISPOSITION:  ICU - intubated and hemodynamically stable.   Delay start of Pharmacological VTE agent (>24hrs) due to surgical blood loss or risk of bleeding: yes  COMPLICATIONS: NO KNOWN

## 2021-03-23 NOTE — Anesthesia Procedure Notes (Signed)
Procedure Name: Intubation Date/Time: 03/12/2021 7:50 AM Performed by: Bryson Corona, CRNA Pre-anesthesia Checklist: Patient identified, Emergency Drugs available, Suction available and Patient being monitored Patient Re-evaluated:Patient Re-evaluated prior to induction Oxygen Delivery Method: Circle System Utilized Preoxygenation: Pre-oxygenation with 100% oxygen Induction Type: IV induction Ventilation: Oral airway inserted - appropriate to patient size and Two handed mask ventilation required Laryngoscope Size: Mac and 4 Grade View: Grade I Tube type: Oral Tube size: 8.0 mm Number of attempts: 1 Airway Equipment and Method: Stylet and Oral airway Placement Confirmation: ETT inserted through vocal cords under direct vision,  positive ETCO2 and breath sounds checked- equal and bilateral Secured at: 24 cm Tube secured with: Tape Dental Injury: Teeth and Oropharynx as per pre-operative assessment

## 2021-03-23 NOTE — Progress Notes (Signed)
PHARMACY NOTE:  ANTIMICROBIAL RENAL DOSAGE ADJUSTMENT  Current antimicrobial regimen includes a mismatch between antimicrobial dosage and estimated renal function.  As per policy approved by the Pharmacy & Therapeutics and Medical Executive Committees, the antimicrobial dosage will be adjusted accordingly.  Current antimicrobial dosage:  Vancomycin '1000mg'$  q12h postop x1  Indication: surgical ppx  Renal Function:  Estimated Creatinine Clearance: 23.4 mL/min (A) (by C-G formula based on SCr of 3.6 mg/dL (H)). '[x]'$      On intermittent HD, scheduled: TTS (last 5/30) '[]'$      On CRRT    Antimicrobial dosage has been changed to:  D/C vancomycin  Additional comments: Pt received pre-op vancomycin which will provide coverage until next iHD session.   Thank you for allowing pharmacy to be a part of this patient's care.  Arrie Senate, PharmD, BCPS, [facility] Clinical Pharmacist [phone] Please check AMION for all [facility] numbers 03/14/2021

## 2021-03-23 NOTE — Anesthesia Procedure Notes (Signed)
Central Venous Catheter Insertion Performed by: Effie Berkshire, MD, anesthesiologist Start/End05/26/2022 7:00 AM, 03/14/2021 7:05 AM Patient location: Pre-op. Preanesthetic checklist: patient identified, IV checked, site marked, risks and benefits discussed, surgical consent, monitors and equipment checked, pre-op evaluation, timeout performed and anesthesia consent Position: Trendelenburg Lidocaine 1% used for infiltration and patient sedated Hand hygiene performed , maximum sterile barriers used  and Seldinger technique used Catheter size: 9 Fr Total catheter length 10. Central line was placed.MAC introducer Swan type:thermodilution PA Cath depth:50 Procedure performed using ultrasound guided technique. Ultrasound Notes:anatomy identified, needle tip was noted to be adjacent to the nerve/plexus identified, no ultrasound evidence of intravascular and/or intraneural injection and image(s) printed for medical record Attempts: 1 Following insertion, line sutured and dressing applied. Post procedure assessment: blood return through all ports, free fluid flow and no air  Patient tolerated the procedure well with no immediate complications.

## 2021-03-23 NOTE — Anesthesia Preprocedure Evaluation (Addendum)
Anesthesia Evaluation  Patient identified by MRN, date of birth, ID band Patient awake    Reviewed: Allergy & Precautions, NPO status , Patient's Chart, lab work & pertinent test results  Airway Mallampati: I  TM Distance: >3 FB Neck ROM: Full    Dental  (+) Teeth Intact, Dental Advisory Given   Pulmonary neg pulmonary ROS,    breath sounds clear to auscultation       Cardiovascular hypertension, Pt. on home beta blockers + CAD and + Peripheral Vascular Disease   Rhythm:Regular Rate:Normal     Neuro/Psych negative neurological ROS  negative psych ROS   GI/Hepatic negative GI ROS, Neg liver ROS,   Endo/Other  diabetes, Type 2  Renal/GU ESRF and DialysisRenal disease     Musculoskeletal   Abdominal Normal abdominal exam  (+)   Peds  Hematology   Anesthesia Other Findings RUE fistula  Reproductive/Obstetrics                            Anesthesia Physical Anesthesia Plan  ASA: IV  Anesthesia Plan: General   Post-op Pain Management:    Induction: Intravenous  PONV Risk Score and Plan: 3 and Ondansetron and Treatment may vary due to age or medical condition  Airway Management Planned: Oral ETT  Additional Equipment: Arterial line, CVP, PA Cath, TEE and Ultrasound Guidance Line Placement  Intra-op Plan:   Post-operative Plan: Post-operative intubation/ventilation  Informed Consent: I have reviewed the patients History and Physical, chart, labs and discussed the procedure including the risks, benefits and alternatives for the proposed anesthesia with the patient or authorized representative who has indicated his/her understanding and acceptance.     Dental advisory given  Plan Discussed with: CRNA  Anesthesia Plan Comments: (Echo:  1. Left ventricular ejection fraction, by estimation, is 40 to 45%. The  left ventricle has mildly decreased function. The left ventricle   demonstrates regional wall motion abnormalities (see scoring  diagram/findings for description). Left ventricular  diastolic parameters are indeterminate.  2. Right ventricular systolic function is moderately reduced. The right  ventricular size is mildly enlarged. There is severely elevated pulmonary  artery systolic pressure. The estimated right ventricular systolic  pressure is [id number] mmHg.  3. Left atrial size was mildly dilated.  4. Right atrial size was mild to moderately dilated.  5. Left pleural effusion and ascites present.  6. The mitral valve is grossly normal. Mild mitral valve regurgitation.  7. Tricuspid valve regurgitation is moderate.  8. The aortic valve is tricuspid. Aortic valve regurgitation is not  visualized. Mild to moderate aortic valve sclerosis/calcification is  present, without any evidence of aortic stenosis. Aortic valve mean  gradient measures 4.0 mmHg.  9. The inferior vena cava is dilated in size with <50% respiratory  variability, suggesting right atrial pressure of 15 mmHg. )       Anesthesia Quick Evaluation

## 2021-03-23 NOTE — Op Note (Signed)
CARDIOTHORACIC SURGERY OPERATIVE NOTE  Date of Procedure: 03/10/2021  Preoperative Diagnosis: Severe 3-vessel Coronary Artery Disease, CHF  Postoperative Diagnosis: Same  Procedure:    Coronary Artery Bypass Grafting x 4  Left Internal Mammary Artery to Distal Left Anterior Descending Coronary Artery; Saphenous Vein Graft to distal Right Coronary Artery; Saphenous Vein Graft to Obtuse Marginal Branch of Left Circumflex Coronary Artery; Sapheonous Vein Graft to Diagonal Branch Coronary Artery;Endoscopic Vein Harvest from right Thigh and Lower Leg  Surgeon: B. Murvin Natal, MD  Assistant: Evonnie Pat, PA-C  Anesthesia: get  Operative Findings:  Mildly reduced left ventricular systolic function  good quality left internal mammary artery conduit, small diameter  good quality saphenous vein conduit  good quality target vessels for grafting    BRIEF CLINICAL NOTE AND INDICATIONS FOR SURGERY  55 yo man with ESRD presented with heart failure sx and evidence for biventricular dysfunction. Eval showed severe 3 V CAD. He has been admitted to [facility] for past week for medical optimization. Taken to OR now for CABG.  DETAILS OF THE OPERATIVE PROCEDURE  Preparation:  The patient is brought to the operating room on the above mentioned date and central monitoring was established by the anesthesia team including placement of Swan-Ganz catheter and radial arterial line. The patient is placed in the supine position on the operating table.  Intravenous antibiotics are administered. General endotracheal anesthesia is induced uneventfully. A Foley catheter is placed.  Baseline transesophageal echocardiogram was performed.  Findings were notable for mildly reduced LV function and intact valvular function  The patient's chest, abdomen, both groins, and both lower extremities are prepared and draped in a sterile manner. A time out procedure is performed.   Surgical Approach and Conduit Harvest:  A  median sternotomy incision was performed and the left internal mammary artery is dissected from the chest wall and prepared for bypass grafting. The left internal mammary artery is notably good quality conduit. Simultaneously, the greater saphenous vein is obtained from the patient's right thigh using endoscopic vein harvest technique. The saphenous vein is notably good quality conduit. After removal of the saphenous vein, the small surgical incisions in the lower extremity are closed with absorbable suture. Following systemic heparinization, the left internal mammary artery was transected distally noted to have excellent flow.   Extracorporeal Cardiopulmonary Bypass and Myocardial Protection:  The pericardium is opened. The ascending aorta is fnormal in appearance. The ascending aorta and the right atrium are cannulated for cardiopulmonary bypass.  Adequate heparinization is verified.     The entire pre-bypass portion of the operation was notable for stable hemodynamics.  Cardiopulmonary bypass was begun and the surface of the heart is inspected. Distal target vessels are selected for coronary artery bypass grafting. A cardioplegia cannula is placed in the ascending aorta.   The patient is allowed to cool passively to 34C systemic temperature.  The aortic cross clamp is applied and cold blood cardioplegia is delivered initially in an antegrade fashion through the aortic root. Iced saline slush is applied for topical hypothermia.  The initial cardioplegic arrest is rapid with early diastolic arrest.  Repeat doses of cardioplegia are administered intermittently throughout the entire cross clamp portion of the operation through the aortic root and through subsequently placed vein grafts in order to maintain completely flat electrocardiogram.  Coronary Artery Bypass Grafting:   The distal right coronary artery was grafted using a reversed saphenous vein graft in an end-to-side fashion.  At the site of  distal anastomosis the target vessel  was good quality and measured approximately 1.5 mm in diameter.  The obtuse marginal branch of the left circumflex coronary artery was grafted using a reversed saphenous vein graft in an end-to-side fashion.  At the site of distal anastomosis the target vessel was good quality and measured approximately 1.82m in diameter.  The  diagonal branch of the left anterior descending coronary artery was grafted using a reversed saphenous vein graft in an end-to-side fashion.  At the site of distal anastomosis the target vessel was fair quality and measured approximately 1.5 mm in diameter.    The distal left anterior coronary artery was grafted with the left internal mammary artery in an end-to-side fashion.  At the site of distal anastomosis the target vessel was good quality and measured approximately 1.5 mm in diameter. Anastomotic patency and runoff was confirmed with indocyanine green fluorescence imaging (SPY).  All proximal vein graft anastomoses were placed directly to the ascending aorta prior to removal of the aortic cross clamp. Deairing procedures were performed and the aortic cross clamp was removed.   Procedure Completion:  All proximal and distal coronary anastomoses were inspected for hemostasis and appropriate graft orientation. Epicardial pacing wires are fixed to the right ventricular outflow tract and to the right atrial appendage. The patient is rewarmed to 37C temperature. The patient is weaned and disconnected from cardiopulmonary bypass.  The patient's rhythm at separation from bypass was sinus bradycardia.  The patient was weaned from cardiopulmonary bypass with mild inotropic support.   Followup transesophageal echocardiogram performed after separation from bypass revealed  no changes from the preoperative exam.  The aortic and venous cannula were removed uneventfully. Protamine was administered to reverse the anticoagulation. The mediastinum  and pleural space were inspected for hemostasis and irrigated with saline solution. The mediastinum and both pleural spaces were drained using fluted chest tubes placed through separate stab incisions inferiorly.  The soft tissues anterior to the aorta were reapproximated loosely. The sternum is closed with double strength sternal wire. The soft tissues anterior to the sternum were closed in multiple layers and the skin is closed with a running subcuticular skin closure.  The post-bypass portion of the operation was notable for stable rhythm and hemodynamics.   Disposition:  The patient tolerated the procedure well and is transported to the surgical intensive care in stable condition. There are no intraoperative complications. All sponge instrument and needle counts are verified correct at completion of the operation.    BJayme Cloud MD 03/22/2021 1:50 PM

## 2021-03-23 NOTE — Anesthesia Procedure Notes (Signed)
Arterial Line Insertion Start/End05/02/2021 6:55 AM, 03/21/2021 7:05 AM Performed by: Bryson Corona, CRNA, CRNA  Patient location: Pre-op. Preanesthetic checklist: patient identified, IV checked, site marked, risks and benefits discussed, surgical consent, monitors and equipment checked, pre-op evaluation, timeout performed and anesthesia consent Lidocaine 1% used for infiltration Left, radial was placed Catheter size: 20 Fr Hand hygiene performed  and maximum sterile barriers used   Attempts: 1 Procedure performed without using ultrasound guided technique. Following insertion, dressing applied and Biopatch. Post procedure assessment: normal and unchanged  Patient tolerated the procedure well with no immediate complications.

## 2021-03-23 NOTE — Transfer of Care (Signed)
Immediate Anesthesia Transfer of Care Note  Patient: David Rose.  Procedure(s) Performed: CORONARY ARTERY BYPASS GRAFTING (CABG)X4. LEFT INTERNAL MAMMARY ARTERY. RIGHT ENDOSCOPIC SAPHENOUS VEIN HARVESTING. (N/A Chest) TRANSESOPHAGEAL ECHOCARDIOGRAM (TEE) (N/A ) INDOCYANINE GREEN FLUORESCENCE IMAGING (ICG) (N/A )  Patient Location: ICU  Anesthesia Type:GA combined with regional for post-op pain  Level of Consciousness: Patient remains intubated per anesthesia plan  Airway & Oxygen Therapy: Patient remains intubated per anesthesia plan and Patient placed on Ventilator (see vital sign flow sheet for setting)  Post-op Assessment: Report given to RN and Post -op Vital signs reviewed and stable  Post vital signs: Reviewed and stable  Last Vitals:  Vitals Value Taken Time  BP 125/65 03/20/2021 1256  Temp 34.5 C 03/06/2021 1300  Pulse 80 03/22/2021 1300  Resp 9 02/21/2021 1300  SpO2 100 % 03/09/2021 1300  Vitals shown include unvalidated device data.  Last Pain:  Vitals:   03/22/21 2347  TempSrc: Oral  PainSc:       Patients Stated Pain Goal: 0 ([id number])  Complications: No complications documented.

## 2021-03-23 NOTE — Progress Notes (Signed)
EVENING ROUNDS NOTE :     [place] [address] [place]       [place] [address]             [phone]                 Day of Surgery Procedure(s) (LRB): CORONARY ARTERY BYPASS GRAFTING (CABG)X4. LEFT INTERNAL MAMMARY ARTERY. RIGHT ENDOSCOPIC SAPHENOUS VEIN HARVESTING. (N/A) TRANSESOPHAGEAL ECHOCARDIOGRAM (TEE) (N/A) INDOCYANINE GREEN FLUORESCENCE IMAGING (ICG) (N/A)   Total Length of Stay:  LOS: 8 days  Events:   Coagulopathic Good hemodynamics Will watch bleeding    BP (!) 100/30   Pulse 81   Temp (!) 97.34 F (36.3 C)   Resp (!) 7   Ht '6\' 2"'$  (1.88 m)   Wt 71.4 kg   SpO2 100%   BMI 20.21 kg/m   PAP: (30-38)/(14-17) 37/15 CO:  [3.9 L/min-5.2 L/min] 4.7 L/min CI:  [2 L/min/m2-2.7 L/min/m2] 2.4 L/min/m2  Vent Mode: PRVC;SIMV;PSV FiO2 (%):  [50 %] 50 % Set Rate:  [12 bmp] 12 bmp Vt Set:  [650 mL] 650 mL PEEP:  [5 cmH20] 5 cmH20 Pressure Support:  [10 cmH20] 10 cmH20 Plateau Pressure:  [15 cmH20] 15 cmH20  . sodium chloride Stopped (03/19/2021 1604)  . [START ON 04/19/2021] sodium chloride    . sodium chloride 10 mL/hr at 03/05/2021 1333  . albumin human 12.5 g (02/21/2021 1550)  . desmopressin (DDAVP) IV for Bleeding    . dexmedetomidine (PRECEDEX) IV infusion Stopped (02/24/2021 1452)  . electrolyte-A 75 mL/hr at 02/28/2021 1700  . epinephrine    . famotidine (PEPCID) IV    . insulin 0.8 mL/hr at 03/08/2021 1700  . lactated ringers    . lactated ringers    . lactated ringers    . [START ON 04/13/2021] levofloxacin (LEVAQUIN) IV    . magnesium sulfate    . milrinone 0.2 mcg/kg/min (02/28/2021 1700)  . nitroGLYCERIN 0 mcg/min (02/24/2021 1247)  . norepinephrine (LEVOPHED) Adult infusion    . phenylephrine (NEO-SYNEPHRINE) Adult infusion 80 mcg/min (02/22/2021 1700)    I/O last 3 completed shifts: In: 0  Out: 2000 [Other:2000]   CBC Latest Ref Rng & Units 03/08/2021 03/06/2021 03/03/2021  WBC 4.0 - 10.5 K/uL 16.7(H) - -  Hemoglobin 13.0 - 17.0 g/dL 9.5(L) 10.5(L) 8.8(L)   Hematocrit 39.0 - 52.0 % 29.7(L) 31.0(L) 26.0(L)  Platelets 150 - 400 K/uL 158 - -    BMP Latest Ref Rng & Units 03/12/2021 03/12/2021 03/13/2021  Glucose 70 - 99 mg/dL - 151(H) -  BUN 6 - 20 mg/dL - 17 -  Creatinine 0.61 - 1.24 mg/dL - 3.60(H) -  Sodium 135 - 145 mmol/L 139 139 139  Potassium 3.5 - 5.1 mmol/L 3.8 3.8 4.9  Chloride 98 - 111 mmol/L - 99 -  CO2 22 - 32 mmol/L - - -  Calcium 8.9 - 10.3 mg/dL - - -    ABG    Component Value Date/Time   PHART 7.404 03/21/2021 1134   PCO2ART 43.9 03/07/2021 1134   PO2ART 512 (H) 03/07/2021 1134   HCO3 27.4 03/20/2021 1134   TCO2 29 03/20/2021 1134   ACIDBASEDEF 5.0 (H) 02/24/2021 0916   O2SAT 100.0 03/22/2021 [place], MD 03/08/2021 5:18 PM

## 2021-03-23 NOTE — Anesthesia Procedure Notes (Signed)
Central Venous Catheter Insertion Performed by: Effie Berkshire, MD, anesthesiologist Start/End05/30/2022 7:05 AM, 03/01/2021 7:10 AM Patient location: Pre-op. Preanesthetic checklist: patient identified, IV checked, site marked, risks and benefits discussed, surgical consent, monitors and equipment checked, pre-op evaluation, timeout performed and anesthesia consent Hand hygiene performed  and maximum sterile barriers used  PA cath was placed.Swan type:thermodilution Procedure performed without using ultrasound guided technique. Attempts: 1 Patient tolerated the procedure well with no immediate complications.

## 2021-03-24 ENCOUNTER — Inpatient Hospital Stay (HOSPITAL_COMMUNITY): Payer: Medicare Other

## 2021-03-24 ENCOUNTER — Encounter (HOSPITAL_COMMUNITY): Admission: RE | Disposition: E | Payer: Self-pay | Source: Ambulatory Visit | Attending: Cardiothoracic Surgery

## 2021-03-24 ENCOUNTER — Encounter (HOSPITAL_COMMUNITY): Payer: Self-pay | Admitting: Cardiothoracic Surgery

## 2021-03-24 ENCOUNTER — Inpatient Hospital Stay (HOSPITAL_COMMUNITY): Payer: Medicare Other | Admitting: Anesthesiology

## 2021-03-24 DIAGNOSIS — R570 Cardiogenic shock: Secondary | ICD-10-CM

## 2021-03-24 DIAGNOSIS — I469 Cardiac arrest, cause unspecified: Secondary | ICD-10-CM

## 2021-03-24 DIAGNOSIS — R253 Fasciculation: Secondary | ICD-10-CM

## 2021-03-24 DIAGNOSIS — I9789 Other postprocedural complications and disorders of the circulatory system, not elsewhere classified: Secondary | ICD-10-CM

## 2021-03-24 DIAGNOSIS — I5023 Acute on chronic systolic (congestive) heart failure: Secondary | ICD-10-CM | POA: Diagnosis not present

## 2021-03-24 HISTORY — PX: EXPLORATION POST OPERATIVE OPEN HEART: SHX5061

## 2021-03-24 LAB — COMPREHENSIVE METABOLIC PANEL
ALT: 17 U/L (ref 0–44)
AST: 35 U/L (ref 15–41)
Albumin: 2.2 g/dL — ABNORMAL LOW (ref 3.5–5.0)
Alkaline Phosphatase: 33 U/L — ABNORMAL LOW (ref 38–126)
Anion gap: 21 — ABNORMAL HIGH (ref 5–15)
BUN: 22 mg/dL — ABNORMAL HIGH (ref 6–20)
CO2: 18 mmol/L — ABNORMAL LOW (ref 22–32)
Calcium: 8 mg/dL — ABNORMAL LOW (ref 8.9–10.3)
Chloride: 103 mmol/L (ref 98–111)
Creatinine, Ser: 3.51 mg/dL — ABNORMAL HIGH (ref 0.61–1.24)
GFR, Estimated: 20 mL/min — ABNORMAL LOW (ref >60)
Glucose, Bld: 280 mg/dL — ABNORMAL HIGH (ref 70–99)
Potassium: 3.5 mmol/L (ref 3.5–5.1)
Sodium: 142 mmol/L (ref 135–145)
Total Bilirubin: 2.2 mg/dL — ABNORMAL HIGH (ref 0.3–1.2)
Total Protein: 3.8 g/dL — ABNORMAL LOW (ref 6.5–8.1)

## 2021-03-24 LAB — RENAL FUNCTION PANEL
Albumin: 3.2 g/dL — ABNORMAL LOW (ref 3.5–5.0)
Anion gap: 13 (ref 5–15)
BUN: 19 mg/dL (ref 6–20)
CO2: 22 mmol/L (ref 22–32)
Calcium: 8.9 mg/dL (ref 8.9–10.3)
Chloride: 105 mmol/L (ref 98–111)
Creatinine, Ser: 2.69 mg/dL — ABNORMAL HIGH (ref 0.61–1.24)
GFR, Estimated: 27 mL/min — ABNORMAL LOW (ref >60)
Glucose, Bld: 120 mg/dL — ABNORMAL HIGH (ref 70–99)
Phosphorus: 3.3 mg/dL (ref 2.5–4.6)
Potassium: 3.4 mmol/L — ABNORMAL LOW (ref 3.5–5.1)
Sodium: 140 mmol/L (ref 135–145)

## 2021-03-24 LAB — POCT I-STAT 7, (LYTES, BLD GAS, ICA,H+H)
Acid-Base Excess: 0 mmol/L (ref 0.0–2.0)
Acid-base deficit: 12 mmol/L — ABNORMAL HIGH (ref 0.0–2.0)
Acid-base deficit: 4 mmol/L — ABNORMAL HIGH (ref 0.0–2.0)
Acid-base deficit: 4 mmol/L — ABNORMAL HIGH (ref 0.0–2.0)
Acid-base deficit: 5 mmol/L — ABNORMAL HIGH (ref 0.0–2.0)
Acid-base deficit: 6 mmol/L — ABNORMAL HIGH (ref 0.0–2.0)
Acid-base deficit: 7 mmol/L — ABNORMAL HIGH (ref 0.0–2.0)
Acid-base deficit: 7 mmol/L — ABNORMAL HIGH (ref 0.0–2.0)
Acid-base deficit: 8 mmol/L — ABNORMAL HIGH (ref 0.0–2.0)
Acid-base deficit: 8 mmol/L — ABNORMAL HIGH (ref 0.0–2.0)
Acid-base deficit: 8 mmol/L — ABNORMAL HIGH (ref 0.0–2.0)
Bicarbonate: 14.2 mmol/L — ABNORMAL LOW (ref 20.0–28.0)
Bicarbonate: 17.1 mmol/L — ABNORMAL LOW (ref 20.0–28.0)
Bicarbonate: 17.7 mmol/L — ABNORMAL LOW (ref 20.0–28.0)
Bicarbonate: 19.4 mmol/L — ABNORMAL LOW (ref 20.0–28.0)
Bicarbonate: 19.6 mmol/L — ABNORMAL LOW (ref 20.0–28.0)
Bicarbonate: 20.6 mmol/L (ref 20.0–28.0)
Bicarbonate: 20.8 mmol/L (ref 20.0–28.0)
Bicarbonate: 20.9 mmol/L (ref 20.0–28.0)
Bicarbonate: 21.2 mmol/L (ref 20.0–28.0)
Bicarbonate: 22.3 mmol/L (ref 20.0–28.0)
Bicarbonate: 25.2 mmol/L (ref 20.0–28.0)
Calcium, Ion: 0.91 mmol/L — ABNORMAL LOW (ref 1.15–1.40)
Calcium, Ion: 0.96 mmol/L — ABNORMAL LOW (ref 1.15–1.40)
Calcium, Ion: 0.97 mmol/L — ABNORMAL LOW (ref 1.15–1.40)
Calcium, Ion: 1 mmol/L — ABNORMAL LOW (ref 1.15–1.40)
Calcium, Ion: 1.01 mmol/L — ABNORMAL LOW (ref 1.15–1.40)
Calcium, Ion: 1.03 mmol/L — ABNORMAL LOW (ref 1.15–1.40)
Calcium, Ion: 1.04 mmol/L — ABNORMAL LOW (ref 1.15–1.40)
Calcium, Ion: 1.07 mmol/L — ABNORMAL LOW (ref 1.15–1.40)
Calcium, Ion: 1.11 mmol/L — ABNORMAL LOW (ref 1.15–1.40)
Calcium, Ion: 1.17 mmol/L (ref 1.15–1.40)
Calcium, Ion: 1.23 mmol/L (ref 1.15–1.40)
HCT: 21 % — ABNORMAL LOW (ref 39.0–52.0)
HCT: 21 % — ABNORMAL LOW (ref 39.0–52.0)
HCT: 21 % — ABNORMAL LOW (ref 39.0–52.0)
HCT: 22 % — ABNORMAL LOW (ref 39.0–52.0)
HCT: 24 % — ABNORMAL LOW (ref 39.0–52.0)
HCT: 24 % — ABNORMAL LOW (ref 39.0–52.0)
HCT: 24 % — ABNORMAL LOW (ref 39.0–52.0)
HCT: 25 % — ABNORMAL LOW (ref 39.0–52.0)
HCT: 26 % — ABNORMAL LOW (ref 39.0–52.0)
HCT: 29 % — ABNORMAL LOW (ref 39.0–52.0)
HCT: 33 % — ABNORMAL LOW (ref 39.0–52.0)
Hemoglobin: 11.2 g/dL — ABNORMAL LOW (ref 13.0–17.0)
Hemoglobin: 7.1 g/dL — ABNORMAL LOW (ref 13.0–17.0)
Hemoglobin: 7.1 g/dL — ABNORMAL LOW (ref 13.0–17.0)
Hemoglobin: 7.1 g/dL — ABNORMAL LOW (ref 13.0–17.0)
Hemoglobin: 7.5 g/dL — ABNORMAL LOW (ref 13.0–17.0)
Hemoglobin: 8.2 g/dL — ABNORMAL LOW (ref 13.0–17.0)
Hemoglobin: 8.2 g/dL — ABNORMAL LOW (ref 13.0–17.0)
Hemoglobin: 8.2 g/dL — ABNORMAL LOW (ref 13.0–17.0)
Hemoglobin: 8.5 g/dL — ABNORMAL LOW (ref 13.0–17.0)
Hemoglobin: 8.8 g/dL — ABNORMAL LOW (ref 13.0–17.0)
Hemoglobin: 9.9 g/dL — ABNORMAL LOW (ref 13.0–17.0)
O2 Saturation: 100 %
O2 Saturation: 100 %
O2 Saturation: 100 %
O2 Saturation: 100 %
O2 Saturation: 100 %
O2 Saturation: 100 %
O2 Saturation: 100 %
O2 Saturation: 88 %
O2 Saturation: 89 %
O2 Saturation: 98 %
O2 Saturation: 99 %
Patient temperature: 33.5
Patient temperature: 33.7
Patient temperature: 34.6
Patient temperature: 35
Patient temperature: 35.9
Patient temperature: 36
Patient temperature: 36
Patient temperature: 36.3
Patient temperature: 36.5
Patient temperature: 37.3
Potassium: 3.1 mmol/L — ABNORMAL LOW (ref 3.5–5.1)
Potassium: 3.1 mmol/L — ABNORMAL LOW (ref 3.5–5.1)
Potassium: 3.1 mmol/L — ABNORMAL LOW (ref 3.5–5.1)
Potassium: 3.2 mmol/L — ABNORMAL LOW (ref 3.5–5.1)
Potassium: 3.5 mmol/L (ref 3.5–5.1)
Potassium: 3.5 mmol/L (ref 3.5–5.1)
Potassium: 3.7 mmol/L (ref 3.5–5.1)
Potassium: 3.7 mmol/L (ref 3.5–5.1)
Potassium: 3.8 mmol/L (ref 3.5–5.1)
Potassium: 4.1 mmol/L (ref 3.5–5.1)
Potassium: 5 mmol/L (ref 3.5–5.1)
Sodium: 142 mmol/L (ref 135–145)
Sodium: 142 mmol/L (ref 135–145)
Sodium: 142 mmol/L (ref 135–145)
Sodium: 143 mmol/L (ref 135–145)
Sodium: 143 mmol/L (ref 135–145)
Sodium: 143 mmol/L (ref 135–145)
Sodium: 143 mmol/L (ref 135–145)
Sodium: 143 mmol/L (ref 135–145)
Sodium: 144 mmol/L (ref 135–145)
Sodium: 145 mmol/L (ref 135–145)
Sodium: 145 mmol/L (ref 135–145)
TCO2: 15 mmol/L — ABNORMAL LOW (ref 22–32)
TCO2: 18 mmol/L — ABNORMAL LOW (ref 22–32)
TCO2: 19 mmol/L — ABNORMAL LOW (ref 22–32)
TCO2: 20 mmol/L — ABNORMAL LOW (ref 22–32)
TCO2: 21 mmol/L — ABNORMAL LOW (ref 22–32)
TCO2: 22 mmol/L (ref 22–32)
TCO2: 22 mmol/L (ref 22–32)
TCO2: 23 mmol/L (ref 22–32)
TCO2: 23 mmol/L (ref 22–32)
TCO2: 24 mmol/L (ref 22–32)
TCO2: 26 mmol/L (ref 22–32)
pCO2 arterial: 27.2 mmHg — ABNORMAL LOW (ref 32.0–48.0)
pCO2 arterial: 27.5 mmHg — ABNORMAL LOW (ref 32.0–48.0)
pCO2 arterial: 28.3 mmHg — ABNORMAL LOW (ref 32.0–48.0)
pCO2 arterial: 32 mmHg (ref 32.0–48.0)
pCO2 arterial: 37.5 mmHg (ref 32.0–48.0)
pCO2 arterial: 39.5 mmHg (ref 32.0–48.0)
pCO2 arterial: 42.9 mmHg (ref 32.0–48.0)
pCO2 arterial: 45.6 mmHg (ref 32.0–48.0)
pCO2 arterial: 45.9 mmHg (ref 32.0–48.0)
pCO2 arterial: 48.9 mmHg — ABNORMAL HIGH (ref 32.0–48.0)
pCO2 arterial: 60 mmHg — ABNORMAL HIGH (ref 32.0–48.0)
pH, Arterial: 7.146 — CL (ref 7.350–7.450)
pH, Arterial: 7.232 — ABNORMAL LOW (ref 7.350–7.450)
pH, Arterial: 7.233 — ABNORMAL LOW (ref 7.350–7.450)
pH, Arterial: 7.255 — ABNORMAL LOW (ref 7.350–7.450)
pH, Arterial: 7.29 — ABNORMAL LOW (ref 7.350–7.450)
pH, Arterial: 7.294 — ABNORMAL LOW (ref 7.350–7.450)
pH, Arterial: 7.323 — ABNORMAL LOW (ref 7.350–7.450)
pH, Arterial: 7.387 (ref 7.350–7.450)
pH, Arterial: 7.392 (ref 7.350–7.450)
pH, Arterial: 7.424 (ref 7.350–7.450)
pH, Arterial: 7.453 — ABNORMAL HIGH (ref 7.350–7.450)
pO2, Arterial: 147 mmHg — ABNORMAL HIGH (ref 83.0–108.0)
pO2, Arterial: 222 mmHg — ABNORMAL HIGH (ref 83.0–108.0)
pO2, Arterial: 224 mmHg — ABNORMAL HIGH (ref 83.0–108.0)
pO2, Arterial: 313 mmHg — ABNORMAL HIGH (ref 83.0–108.0)
pO2, Arterial: 363 mmHg — ABNORMAL HIGH (ref 83.0–108.0)
pO2, Arterial: 373 mmHg — ABNORMAL HIGH (ref 83.0–108.0)
pO2, Arterial: 446 mmHg — ABNORMAL HIGH (ref 83.0–108.0)
pO2, Arterial: 46 mmHg — ABNORMAL LOW (ref 83.0–108.0)
pO2, Arterial: 46 mmHg — ABNORMAL LOW (ref 83.0–108.0)
pO2, Arterial: 492 mmHg — ABNORMAL HIGH (ref 83.0–108.0)
pO2, Arterial: 97 mmHg (ref 83.0–108.0)

## 2021-03-24 LAB — BASIC METABOLIC PANEL
Anion gap: 16 — ABNORMAL HIGH (ref 5–15)
Anion gap: 16 — ABNORMAL HIGH (ref 5–15)
BUN: 20 mg/dL (ref 6–20)
BUN: 21 mg/dL — ABNORMAL HIGH (ref 6–20)
CO2: 21 mmol/L — ABNORMAL LOW (ref 22–32)
CO2: 19 mmol/L — ABNORMAL LOW (ref 22–32)
Calcium: 7.5 mg/dL — ABNORMAL LOW (ref 8.9–10.3)
Calcium: 8.6 mg/dL — ABNORMAL LOW (ref 8.9–10.3)
Chloride: 103 mmol/L (ref 98–111)
Chloride: 107 mmol/L (ref 98–111)
Creatinine, Ser: 3.56 mg/dL — ABNORMAL HIGH (ref 0.61–1.24)
Creatinine, Ser: 3.14 mg/dL — ABNORMAL HIGH (ref 0.61–1.24)
GFR, Estimated: 19 mL/min — ABNORMAL LOW (ref >60)
GFR, Estimated: 23 mL/min — ABNORMAL LOW (ref >60)
Glucose, Bld: 252 mg/dL — ABNORMAL HIGH (ref 70–99)
Glucose, Bld: 198 mg/dL — ABNORMAL HIGH (ref 70–99)
Potassium: 5.7 mmol/L — ABNORMAL HIGH (ref 3.5–5.1)
Potassium: 2.9 mmol/L — ABNORMAL LOW (ref 3.5–5.1)
Sodium: 140 mmol/L (ref 135–145)
Sodium: 142 mmol/L (ref 135–145)

## 2021-03-24 LAB — PREPARE CRYOPRECIPITATE
Unit division: 0
Unit division: 0

## 2021-03-24 LAB — BPAM CRYOPRECIPITATE
Blood Product Expiration Date: 202205312310
Blood Product Expiration Date: 202205312310
ISSUE DATE / TIME: 202205311753
ISSUE DATE / TIME: 202205311753
Unit Type and Rh: 6200
Unit Type and Rh: 6200

## 2021-03-24 LAB — DIC (DISSEMINATED INTRAVASCULAR COAGULATION)PANEL
D-Dimer, Quant: 2.72 ug/mL-FEU — ABNORMAL HIGH (ref 0.00–0.50)
D-Dimer, Quant: 2.46 ug/mL-FEU — ABNORMAL HIGH (ref 0.00–0.50)
Fibrinogen: 263 mg/dL (ref 210–475)
Fibrinogen: 279 mg/dL (ref 210–475)
INR: 0.9 (ref 0.8–1.2)
INR: 1.2 (ref 0.8–1.2)
Platelets: 40 10*3/uL — ABNORMAL LOW (ref 150–400)
Platelets: 37 10*3/uL — ABNORMAL LOW (ref 150–400)
Prothrombin Time: 12.5 seconds (ref 11.4–15.2)
Prothrombin Time: 15 seconds (ref 11.4–15.2)
Smear Review: NONE SEEN
Smear Review: NONE SEEN
aPTT: 32 seconds (ref 24–36)
aPTT: 34 seconds (ref 24–36)

## 2021-03-24 LAB — CBC
HCT: 27.4 % — ABNORMAL LOW (ref 39.0–52.0)
HCT: 29.6 % — ABNORMAL LOW (ref 39.0–52.0)
HCT: 34 % — ABNORMAL LOW (ref 39.0–52.0)
HCT: 32.3 % — ABNORMAL LOW (ref 39.0–52.0)
Hemoglobin: 9.2 g/dL — ABNORMAL LOW (ref 13.0–17.0)
Hemoglobin: 9.9 g/dL — ABNORMAL LOW (ref 13.0–17.0)
Hemoglobin: 11.9 g/dL — ABNORMAL LOW (ref 13.0–17.0)
Hemoglobin: 11.6 g/dL — ABNORMAL LOW (ref 13.0–17.0)
MCH: 31.2 pg (ref 26.0–34.0)
MCH: 30.1 pg (ref 26.0–34.0)
MCH: 29.9 pg (ref 26.0–34.0)
MCH: 30.1 pg (ref 26.0–34.0)
MCHC: 33.6 g/dL (ref 30.0–36.0)
MCHC: 33.4 g/dL (ref 30.0–36.0)
MCHC: 35 g/dL (ref 30.0–36.0)
MCHC: 35.9 g/dL (ref 30.0–36.0)
MCV: 92.9 fL (ref 80.0–100.0)
MCV: 90 fL (ref 80.0–100.0)
MCV: 85.4 fL (ref 80.0–100.0)
MCV: 83.9 fL (ref 80.0–100.0)
Platelets: 164 10*3/uL (ref 150–400)
Platelets: 64 10*3/uL — ABNORMAL LOW (ref 150–400)
Platelets: 41 10*3/uL — ABNORMAL LOW (ref 150–400)
Platelets: 38 10*3/uL — ABNORMAL LOW (ref 150–400)
RBC: 2.95 MIL/uL — ABNORMAL LOW (ref 4.22–5.81)
RBC: 3.29 MIL/uL — ABNORMAL LOW (ref 4.22–5.81)
RBC: 3.98 MIL/uL — ABNORMAL LOW (ref 4.22–5.81)
RBC: 3.85 MIL/uL — ABNORMAL LOW (ref 4.22–5.81)
RDW: 17.2 % — ABNORMAL HIGH (ref 11.5–15.5)
RDW: 16 % — ABNORMAL HIGH (ref 11.5–15.5)
RDW: 15 % (ref 11.5–15.5)
RDW: 15.3 % (ref 11.5–15.5)
WBC: 15.8 10*3/uL — ABNORMAL HIGH (ref 4.0–10.5)
WBC: 10.8 10*3/uL — ABNORMAL HIGH (ref 4.0–10.5)
WBC: 11 10*3/uL — ABNORMAL HIGH (ref 4.0–10.5)
WBC: 12.8 10*3/uL — ABNORMAL HIGH (ref 4.0–10.5)
nRBC: 0.6 % — ABNORMAL HIGH (ref 0.0–0.2)
nRBC: 0 % (ref 0.0–0.2)
nRBC: 0 % (ref 0.0–0.2)
nRBC: 0 % (ref 0.0–0.2)

## 2021-03-24 LAB — PREPARE PLATELET PHERESIS
Unit division: 0
Unit division: 0
Unit division: 0

## 2021-03-24 LAB — BPAM FFP
Blood Product Expiration Date: 202206012359
Blood Product Expiration Date: 202206012359
Blood Product Expiration Date: 202206012359
Blood Product Expiration Date: 202206012359
ISSUE DATE / TIME: 202205311048
ISSUE DATE / TIME: 202205311048
ISSUE DATE / TIME: 202205311311
ISSUE DATE / TIME: 202205311458
Unit Type and Rh: 600
Unit Type and Rh: 6200
Unit Type and Rh: 6200
Unit Type and Rh: 6200

## 2021-03-24 LAB — PREPARE FRESH FROZEN PLASMA
Unit division: 0
Unit division: 0
Unit division: 0

## 2021-03-24 LAB — GLUCOSE, CAPILLARY
Glucose-Capillary: 104 mg/dL — ABNORMAL HIGH (ref 70–99)
Glucose-Capillary: 106 mg/dL — ABNORMAL HIGH (ref 70–99)
Glucose-Capillary: 114 mg/dL — ABNORMAL HIGH (ref 70–99)
Glucose-Capillary: 115 mg/dL — ABNORMAL HIGH (ref 70–99)
Glucose-Capillary: 136 mg/dL — ABNORMAL HIGH (ref 70–99)
Glucose-Capillary: 136 mg/dL — ABNORMAL HIGH (ref 70–99)
Glucose-Capillary: 145 mg/dL — ABNORMAL HIGH (ref 70–99)
Glucose-Capillary: 147 mg/dL — ABNORMAL HIGH (ref 70–99)
Glucose-Capillary: 152 mg/dL — ABNORMAL HIGH (ref 70–99)
Glucose-Capillary: 152 mg/dL — ABNORMAL HIGH (ref 70–99)
Glucose-Capillary: 155 mg/dL — ABNORMAL HIGH (ref 70–99)
Glucose-Capillary: 161 mg/dL — ABNORMAL HIGH (ref 70–99)
Glucose-Capillary: 170 mg/dL — ABNORMAL HIGH (ref 70–99)
Glucose-Capillary: 173 mg/dL — ABNORMAL HIGH (ref 70–99)
Glucose-Capillary: 188 mg/dL — ABNORMAL HIGH (ref 70–99)
Glucose-Capillary: 205 mg/dL — ABNORMAL HIGH (ref 70–99)
Glucose-Capillary: 215 mg/dL — ABNORMAL HIGH (ref 70–99)
Glucose-Capillary: 232 mg/dL — ABNORMAL HIGH (ref 70–99)
Glucose-Capillary: 235 mg/dL — ABNORMAL HIGH (ref 70–99)
Glucose-Capillary: 253 mg/dL — ABNORMAL HIGH (ref 70–99)
Glucose-Capillary: 260 mg/dL — ABNORMAL HIGH (ref 70–99)
Glucose-Capillary: 280 mg/dL — ABNORMAL HIGH (ref 70–99)
Glucose-Capillary: 65 mg/dL — ABNORMAL LOW (ref 70–99)

## 2021-03-24 LAB — BPAM PLATELET PHERESIS
Blood Product Expiration Date: 202206022359
Blood Product Expiration Date: 202206022359
Blood Product Expiration Date: 202206032359
ISSUE DATE / TIME: 202205311047
ISSUE DATE / TIME: 202205311720
ISSUE DATE / TIME: 202205311720
Unit Type and Rh: 5100
Unit Type and Rh: 6200
Unit Type and Rh: 6200

## 2021-03-24 LAB — POCT I-STAT, CHEM 8
BUN: 20 mg/dL (ref 6–20)
Calcium, Ion: 1.22 mmol/L (ref 1.15–1.40)
Chloride: 104 mmol/L (ref 98–111)
Creatinine, Ser: 3.1 mg/dL — ABNORMAL HIGH (ref 0.61–1.24)
Glucose, Bld: 264 mg/dL — ABNORMAL HIGH (ref 70–99)
HCT: 23 % — ABNORMAL LOW (ref 39.0–52.0)
Hemoglobin: 7.8 g/dL — ABNORMAL LOW (ref 13.0–17.0)
Potassium: 3.1 mmol/L — ABNORMAL LOW (ref 3.5–5.1)
Sodium: 144 mmol/L (ref 135–145)
TCO2: 17 mmol/L — ABNORMAL LOW (ref 22–32)

## 2021-03-24 LAB — MAGNESIUM
Magnesium: 2.6 mg/dL — ABNORMAL HIGH (ref 1.7–2.4)
Magnesium: 2 mg/dL (ref 1.7–2.4)

## 2021-03-24 LAB — PREPARE RBC (CROSSMATCH): Order Confirmation: POSITIVE

## 2021-03-24 LAB — PROTIME-INR
INR: 1.3 — ABNORMAL HIGH (ref 0.8–1.2)
INR: 1.8 — ABNORMAL HIGH (ref 0.8–1.2)
Prothrombin Time: 16.1 seconds — ABNORMAL HIGH (ref 11.4–15.2)
Prothrombin Time: 20.6 seconds — ABNORMAL HIGH (ref 11.4–15.2)

## 2021-03-24 LAB — FIBRINOGEN: Fibrinogen: 237 mg/dL (ref 210–475)

## 2021-03-24 LAB — APTT: aPTT: 39 seconds — ABNORMAL HIGH (ref 24–36)

## 2021-03-24 SURGERY — EXPLORATION POST OPERATIVE OPEN HEART
Anesthesia: General | Site: Chest

## 2021-03-24 MED ORDER — SODIUM CHLORIDE 0.9% IV SOLUTION: Freq: Once | INTRAVENOUS | Status: AC

## 2021-03-24 MED ORDER — LACTATED RINGERS IV SOLN: INTRAVENOUS | Status: DC | PRN

## 2021-03-24 MED ORDER — HEPARIN SODIUM (PORCINE) 1000 UNIT/ML IJ SOLN
INTRAMUSCULAR | Status: AC
  Filled 2021-03-24: qty 2

## 2021-03-24 MED ORDER — ROCURONIUM BROMIDE 100 MG/10ML IV SOLN
INTRAVENOUS | Status: DC | PRN
  Administered 2021-03-24: 100 mg via INTRAVENOUS

## 2021-03-24 MED ORDER — SODIUM CHLORIDE 0.9 % IV SOLN
INTRAVENOUS | Status: AC
  Filled 2021-03-24: qty 1.2

## 2021-03-24 MED ORDER — EPINEPHRINE HCL 5 MG/250ML IV SOLN IN NS
INTRAVENOUS | Status: DC | PRN
  Administered 2021-03-24: 4 ug/min via INTRAVENOUS

## 2021-03-24 MED ORDER — ASPIRIN 300 MG RE SUPP: 300.0000 mg | Freq: Every day | RECTAL | Status: DC

## 2021-03-24 MED ORDER — POTASSIUM CHLORIDE 10 MEQ/50ML IV SOLN
10.0000 meq | INTRAVENOUS | Status: AC
  Administered 2021-03-24 (×3): 10 meq via INTRAVENOUS
  Filled 2021-03-24 (×2): qty 50

## 2021-03-24 MED ORDER — SODIUM CHLORIDE 0.9 % FOR CRRT: INTRAVENOUS_CENTRAL | Status: DC | PRN

## 2021-03-24 MED ORDER — COAGULATION FACTOR VIIA RECOMB 1 MG IV SOLR
90.0000 ug/kg | Freq: Once | INTRAVENOUS | Status: AC
  Administered 2021-03-24: 6000 ug via INTRAVENOUS
  Filled 2021-03-24: qty 5

## 2021-03-24 MED ORDER — SODIUM BICARBONATE 8.4 % IV SOLN
INTRAVENOUS | Status: AC
  Filled 2021-03-24: qty 150

## 2021-03-24 MED ORDER — CHLORHEXIDINE GLUCONATE 0.12% ORAL RINSE (MEDLINE KIT)
15.0000 mL | Freq: Two times a day (BID) | OROMUCOSAL | Status: DC
  Administered 2021-03-24 – 2021-03-25 (×2): 15 mL via OROMUCOSAL

## 2021-03-24 MED ORDER — SODIUM CHLORIDE 0.9% IV SOLUTION: Freq: Once | INTRAVENOUS | Status: DC

## 2021-03-24 MED ORDER — SODIUM CHLORIDE 0.9 % IV SOLN: 10.0000 mL/h | Freq: Once | INTRAVENOUS | Status: AC

## 2021-03-24 MED ORDER — NOREPINEPHRINE 4 MG/250ML-% IV SOLN
INTRAVENOUS | Status: DC | PRN
  Administered 2021-03-24: 40 ug/min via INTRAVENOUS

## 2021-03-24 MED ORDER — HEPARIN SOD (PORK) LOCK FLUSH 100 UNIT/ML IV SOLN
INTRAVENOUS | Status: AC
  Filled 2021-03-24: qty 5

## 2021-03-24 MED ORDER — PRISMASOL BGK 4/2.5 32-4-2.5 MEQ/L EC SOLN: Status: DC

## 2021-03-24 MED ORDER — SODIUM CHLORIDE 0.9 % IR SOLN
Status: DC | PRN
  Administered 2021-03-24: 3000 mL

## 2021-03-24 MED ORDER — PRISMASOL BGK 4/2.5 32-4-2.5 MEQ/L REPLACEMENT SOLN: Status: DC

## 2021-03-24 MED ORDER — AMIODARONE HCL IN DEXTROSE 360-4.14 MG/200ML-% IV SOLN
30.0000 mg/h | INTRAVENOUS | Status: DC
  Administered 2021-03-24 – 2021-03-31 (×14): 30 mg/h via INTRAVENOUS
  Filled 2021-03-24 (×14): qty 200

## 2021-03-24 MED ORDER — HEPARIN SODIUM (PORCINE) 1000 UNIT/ML DIALYSIS
1000.0000 [IU] | INTRAMUSCULAR | Status: DC | PRN
  Filled 2021-03-24: qty 4
  Filled 2021-03-24: qty 6

## 2021-03-24 MED ORDER — SODIUM BICARBONATE 8.4 % IV SOLN
100.0000 meq | Freq: Once | INTRAVENOUS | Status: AC
  Administered 2021-03-24: 100 meq via INTRAVENOUS
  Filled 2021-03-24: qty 100

## 2021-03-24 MED ORDER — ORAL CARE MOUTH RINSE
15.0000 mL | OROMUCOSAL | Status: DC
  Administered 2021-03-24 – 2021-03-25 (×5): 15 mL via OROMUCOSAL

## 2021-03-24 MED ORDER — AMIODARONE LOAD VIA INFUSION
150.0000 mg | Freq: Once | INTRAVENOUS | Status: AC
  Administered 2021-03-24: 150 mg via INTRAVENOUS
  Filled 2021-03-24: qty 83.34

## 2021-03-24 MED ORDER — VASOPRESSIN 20 UNITS/100 ML INFUSION FOR SHOCK
0.0000 [IU]/min | INTRAVENOUS | Status: DC
  Administered 2021-03-24 – 2021-03-25 (×5): 0.04 [IU]/min via INTRAVENOUS
  Administered 2021-03-25: 0.02 [IU]/min via INTRAVENOUS
  Filled 2021-03-24 (×6): qty 100

## 2021-03-24 MED ORDER — CALCIUM GLUCONATE-NACL 1-0.675 GM/50ML-% IV SOLN
1.0000 g | Freq: Once | INTRAVENOUS | Status: AC
  Administered 2021-03-24: 1000 mg via INTRAVENOUS
  Filled 2021-03-24: qty 50

## 2021-03-24 MED ORDER — HEPARIN SODIUM (PORCINE) 1000 UNIT/ML IJ SOLN
INTRAMUSCULAR | Status: DC | PRN
  Administered 2021-03-24: 5000 [IU] via INTRAVENOUS

## 2021-03-24 MED ORDER — ALBUMIN HUMAN 5 % IV SOLN
INTRAVENOUS | Status: AC
  Administered 2021-03-24: 12.5 g
  Filled 2021-03-24: qty 750

## 2021-03-24 MED ORDER — AMIODARONE HCL IN DEXTROSE 360-4.14 MG/200ML-% IV SOLN
INTRAVENOUS | Status: AC
  Filled 2021-03-24: qty 200

## 2021-03-24 MED ORDER — VASOPRESSIN 20 UNIT/ML IV SOLN
INTRAVENOUS | Status: AC
  Filled 2021-03-24: qty 1

## 2021-03-24 MED ORDER — SODIUM BICARBONATE 8.4 % IV SOLN
INTRAVENOUS | Status: DC | PRN
  Administered 2021-03-24 (×2): 50 meq via INTRAVENOUS

## 2021-03-24 MED ORDER — FENTANYL CITRATE (PF) 250 MCG/5ML IJ SOLN
INTRAMUSCULAR | Status: AC
  Filled 2021-03-24: qty 5

## 2021-03-24 MED ORDER — CALCIUM CHLORIDE 10 % IV SOLN
INTRAVENOUS | Status: DC | PRN
  Administered 2021-03-24 (×2): 200 mg via INTRAVENOUS
  Administered 2021-03-24: 100 mg via INTRAVENOUS
  Administered 2021-03-24 (×2): 200 mg via INTRAVENOUS
  Administered 2021-03-24: 100 mg via INTRAVENOUS

## 2021-03-24 MED ORDER — SODIUM CHLORIDE 0.9 % IV SOLN
20.0000 ug | Freq: Once | INTRAVENOUS | Status: AC
  Administered 2021-03-24: 20 ug via INTRAVENOUS
  Filled 2021-03-24: qty 5

## 2021-03-24 MED ORDER — ALBUMIN HUMAN 5 % IV SOLN
12.5000 g | Freq: Once | INTRAVENOUS | Status: AC
  Administered 2021-03-24: 12.5 g via INTRAVENOUS
  Filled 2021-03-24: qty 250

## 2021-03-24 MED ORDER — CALCIUM CHLORIDE 10 % IV SOLN
1.0000 g | Freq: Once | INTRAVENOUS | Status: AC
  Administered 2021-03-24: 1 g via INTRAVENOUS

## 2021-03-24 MED ORDER — PROPOFOL 1000 MG/100ML IV EMUL
5.0000 ug/kg/min | INTRAVENOUS | Status: DC
  Administered 2021-03-24: 30 ug/kg/min via INTRAVENOUS
  Administered 2021-03-24: 5 ug/kg/min via INTRAVENOUS
  Administered 2021-03-25: 40 ug/kg/min via INTRAVENOUS
  Filled 2021-03-24 (×3): qty 100

## 2021-03-24 MED ORDER — AMIODARONE HCL IN DEXTROSE 360-4.14 MG/200ML-% IV SOLN
60.0000 mg/h | INTRAVENOUS | Status: AC
  Administered 2021-03-24: 60 mg/h via INTRAVENOUS

## 2021-03-24 MED ORDER — SODIUM CHLORIDE 0.9 % IV SOLN: INTRAVENOUS | Status: DC | PRN

## 2021-03-24 MED ORDER — FENTANYL CITRATE (PF) 250 MCG/5ML IJ SOLN
INTRAMUSCULAR | Status: DC | PRN
  Administered 2021-03-24 (×5): 50 ug via INTRAVENOUS

## 2021-03-24 MED ORDER — SODIUM CHLORIDE 0.9 % IV SOLN
INTRAVENOUS | Status: DC | PRN
  Administered 2021-03-24: 500 mL

## 2021-03-24 MED ORDER — PROTAMINE SULFATE 10 MG/ML IV SOLN
50.0000 mg | Freq: Once | INTRAVENOUS | Status: AC
  Administered 2021-03-24: 50 mg via INTRAVENOUS
  Filled 2021-03-24: qty 5

## 2021-03-24 MED ORDER — SODIUM CHLORIDE (PF) 0.9 % IJ SOLN
OROMUCOSAL | Status: DC | PRN
  Administered 2021-03-24 (×2): 4 mL via TOPICAL

## 2021-03-24 MED ORDER — POTASSIUM CHLORIDE 10 MEQ/50ML IV SOLN
10.0000 meq | INTRAVENOUS | Status: AC
  Filled 2021-03-24: qty 50

## 2021-03-24 MED ORDER — ALBUMIN HUMAN 5 % IV SOLN
INTRAVENOUS | Status: AC
  Administered 2021-03-24: 12.5 g
  Filled 2021-03-24: qty 250

## 2021-03-24 MED FILL — Sodium Bicarbonate IV Soln 8.4%: INTRAVENOUS | Qty: 50 | Status: AC

## 2021-03-24 MED FILL — Sodium Chloride IV Soln 0.9%: INTRAVENOUS | Qty: 6000 | Status: AC

## 2021-03-24 MED FILL — Heparin Sodium (Porcine) Inj 1000 Unit/ML: INTRAMUSCULAR | Qty: 10 | Status: AC

## 2021-03-24 MED FILL — Electrolyte-R (PH 7.4) Solution: INTRAVENOUS | Qty: 3000 | Status: AC

## 2021-03-24 SURGICAL SUPPLY — 55 items
BAG DECANTER FOR FLEXI CONT (MISCELLANEOUS) ×3 IMPLANT
BANDAGE ESMARK 6X9 LF (GAUZE/BANDAGES/DRESSINGS) IMPLANT
BENZOIN TINCTURE PRP APPL 2/3 (GAUZE/BANDAGES/DRESSINGS) IMPLANT
BLADE CLIPPER SURG (BLADE) ×3 IMPLANT
BLADE SURG 10 STRL SS (BLADE) ×3 IMPLANT
BNDG ESMARK 6X9 LF (GAUZE/BANDAGES/DRESSINGS)
BNDG GAUZE ELAST 4 BULKY (GAUZE/BANDAGES/DRESSINGS) IMPLANT
CANISTER SUCT 3000ML PPV (MISCELLANEOUS) ×3 IMPLANT
CANISTER WOUND CARE 500ML ATS (WOUND CARE) ×3 IMPLANT
CATH PALINDROME-P 23CM W/VT (CATHETERS) ×3 IMPLANT
CLIP VESOCCLUDE SM WIDE 24/CT (CLIP) IMPLANT
CNTNR URN SCR LID CUP LEK RST (MISCELLANEOUS) IMPLANT
CONT SPEC 4OZ STRL OR WHT (MISCELLANEOUS)
DRAPE INCISE IOBAN 66X45 STRL (DRAPES) IMPLANT
DRAPE LAPAROSCOPIC ABDOMINAL (DRAPES) ×3 IMPLANT
DRAPE WARM FLUID 44X44 (DRAPES) IMPLANT
DRSG AQUACEL AG ADV 3.5X14 (GAUZE/BANDAGES/DRESSINGS) ×3 IMPLANT
DRSG COVADERM 4X14 (GAUZE/BANDAGES/DRESSINGS) ×3 IMPLANT
DRSG VAC ATS LRG SENSATRAC (GAUZE/BANDAGES/DRESSINGS) IMPLANT
DRSG VAC ATS MED SENSATRAC (GAUZE/BANDAGES/DRESSINGS) IMPLANT
DRSG VAC ATS SM SENSATRAC (GAUZE/BANDAGES/DRESSINGS) IMPLANT
ELECT REM PT RETURN 9FT ADLT (ELECTROSURGICAL) ×3
ELECTRODE REM PT RTRN 9FT ADLT (ELECTROSURGICAL) ×2 IMPLANT
GAUZE SPONGE 4X4 12PLY STRL (GAUZE/BANDAGES/DRESSINGS) ×3 IMPLANT
GAUZE XEROFORM 5X9 LF (GAUZE/BANDAGES/DRESSINGS) IMPLANT
GLOVE NEODERM STRL 7.5 LF PF (GLOVE) ×2 IMPLANT
GLOVE SURG NEODERM 7.5  LF PF (GLOVE) ×1
GOWN STRL REUS W/ TWL LRG LVL3 (GOWN DISPOSABLE) ×6 IMPLANT
GOWN STRL REUS W/TWL LRG LVL3 (GOWN DISPOSABLE) ×3
HANDPIECE INTERPULSE COAX TIP (DISPOSABLE)
HEMOSTAT POWDER SURGIFOAM 1G (HEMOSTASIS) IMPLANT
HEMOSTAT SURGICEL 2X14 (HEMOSTASIS) IMPLANT
KIT BASIN OR (CUSTOM PROCEDURE TRAY) ×3 IMPLANT
KIT TURNOVER KIT B (KITS) ×3 IMPLANT
NS IRRIG 1000ML POUR BTL (IV SOLUTION) ×3 IMPLANT
PACK GENERAL/GYN (CUSTOM PROCEDURE TRAY) ×3 IMPLANT
PAD ARMBOARD 7.5X6 YLW CONV (MISCELLANEOUS) ×6 IMPLANT
SEALANT SURG COSEAL 8ML (VASCULAR PRODUCTS) ×3 IMPLANT
SET HNDPC FAN SPRY TIP SCT (DISPOSABLE) IMPLANT
SOL PREP POV-IOD 4OZ 10% (MISCELLANEOUS) IMPLANT
SPONGE LAP 18X18 RF (DISPOSABLE) ×3 IMPLANT
STAPLER VISISTAT 35W (STAPLE) IMPLANT
SUT ETHILON 3 0 FSL (SUTURE) IMPLANT
SUT PDS AB 1 CTX 36 (SUTURE) IMPLANT
SUT PROLENE 2 0 MH 48 (SUTURE) IMPLANT
SUT PROLENE 5 0 C 1 36 (SUTURE) ×3 IMPLANT
SUT PROLENE 7 0 BV1 MDA (SUTURE) ×3 IMPLANT
SUT SILK 2 0 SH CR/8 (SUTURE) ×6 IMPLANT
SUT VIC AB 2-0 CTX 27 (SUTURE) ×6 IMPLANT
SWAB COLLECTION DEVICE MRSA (MISCELLANEOUS) IMPLANT
SYR 5ML LL (SYRINGE) IMPLANT
TAPE CLOTH SURG 4X10 WHT LF (GAUZE/BANDAGES/DRESSINGS) ×3 IMPLANT
TOWEL GREEN STERILE (TOWEL DISPOSABLE) ×3 IMPLANT
TOWEL GREEN STERILE FF (TOWEL DISPOSABLE) ×3 IMPLANT
WATER STERILE IRR 1000ML POUR (IV SOLUTION) ×3 IMPLANT

## 2021-03-24 NOTE — Progress Notes (Signed)
  Amiodarone Drug - Drug Interaction Consult Note  Recommendations: Monitor HR and QTc Amiodarone is metabolized by the cytochrome P450 system and therefore has the potential to cause many drug interactions. Amiodarone has an average plasma half-life of 50 days (range 20 to 100 days).   There is potential for drug interactions to occur several weeks or months after stopping treatment and the onset of drug interactions may be slow after initiating amiodarone.   '[x]'$  Statins: Increased risk of myopathy. Simvastatin- restrict dose to '20mg'$  daily. Other statins: counsel patients to report any muscle pain or weakness immediately.  '[]'$  Anticoagulants: Amiodarone can increase anticoagulant effect. Consider warfarin dose reduction. Patients should be monitored closely and the dose of anticoagulant altered accordingly, remembering that amiodarone levels take several weeks to stabilize.  '[]'$  Antiepileptics: Amiodarone can increase plasma concentration of phenytoin, the dose should be reduced. Note that small changes in phenytoin dose can result in large changes in levels. Monitor patient and counsel on signs of toxicity.  '[x]'$  Beta blockers: increased risk of bradycardia, AV block and myocardial depression. Sotalol - avoid concomitant use.  '[]'$   Calcium channel blockers (diltiazem and verapamil): increased risk of bradycardia, AV block and myocardial depression.  '[]'$   Cyclosporine: Amiodarone increases levels of cyclosporine. Reduced dose of cyclosporine is recommended.  '[]'$  Digoxin dose should be halved when amiodarone is started.  '[]'$  Diuretics: increased risk of cardiotoxicity if hypokalemia occurs.  '[]'$  Oral hypoglycemic agents (glyburide, glipizide, glimepiride): increased risk of hypoglycemia. Patient's glucose levels should be monitored closely when initiating amiodarone therapy.   '[x]'$  Drugs that prolong the QT interval:  Torsades de pointes risk may be increased with concurrent use - avoid if  possible.  Monitor QTc, also keep magnesium/potassium WNL if concurrent therapy can't be avoided. Marland Kitchen Antibiotics: e.g. fluoroquinolones, erythromycin. . Antiarrhythmics: e.g. quinidine, procainamide, disopyramide, sotalol. . Antipsychotics: e.g. phenothiazines, haloperidol.  . Lithium, tricyclic antidepressants, and methadone. Thank You,  Wynona Neat, PharmD, BCPS   03/26/2021  3:08 AM

## 2021-03-24 NOTE — Progress Notes (Signed)
Patient ID: David Nakayama., male   DOB: [birth year], 55 y.o.   MRN: [id number]  [facility] Progress Note   Assessment/ Plan:   1.  Coronary artery disease: With previously documented severe three-vessel disease and decreased ejection fraction and yesterday had four-vessel CABG by Dr. Orvan Seen.  Overnight had PEA arrest after extubation requiring resuscitation with CPR and earlier this morning mediastinal wound exploration for bleeding.  He is currently off of pressors and appears hemodynamically stable. 2.  End-stage renal disease: He is usually on a Tuesday/Thursday/Saturday dialysis schedule via Rose upper arm AV fistula and last had hemodialysis on 5/30.  The plan is for initiation of CRRT at this time for a more regulated ultrafiltration with improving hemodynamic state. 3.  Pulmonary hypertension/chronic systolic heart failure: Requiring volume resuscitation following bleeding/PEA arrest as well as products for anticoagulation.  Initiate CRRT for volume removal. 4.  Anemia of chronic kidney disease: Hemoglobin and hematocrit within goal, continue to treat with ESA and monitor for overt losses. 5.  Chronic kidney disease/metabolic bone disease: Calcium and phosphorus level within acceptable range and on 3 times weekly Sensipar for PTH control. 6.  History of cirrhosis with recurrent ascites: Paracentesis periodically as indicated.  Subjective:   Suffered PEA arrest overnight requiring CPR/external chest compression followed by mediastinal reexploration.  He has had problems with coagulopathy/bleeding that are likely exacerbated by underlying cirrhosis/ESRD.   Objective:   BP (!) 108/41   Pulse (!) 54   Temp (!) 93.02 F (33.9 C)   Resp (!) 24   Ht '6\' 2"'$  (1.88 m)   Wt 71.4 kg   SpO2 99%   BMI 20.21 kg/m   Intake/Output Summary (Last 24 hours) at 04/18/2021 0951 Last data filed at 04/18/2021 [id number] Gross per 24 hour  Intake 13706.97 ml  Output 9570 ml  Net 4136.97 ml    Weight change:   Physical Exam: Gen: Intubated, appears comfortable, sedation weaned off CVS: Pulse regular rhythm, normal rate, midline sternal incision, distant heart sounds Resp: Clear to auscultation bilaterally, no rales/rhonchi over anterior chest Abd: Soft, flat, nontender Ext: No lower extremity edema, Rose upper arm AV fistula with poor augmentation and low pitched bruit  Imaging: DG CHEST PORT 1 VIEW  Result Date: 04/17/2021 CLINICAL DATA:  Status post open heart surgery. EXAM: PORTABLE CHEST 1 VIEW COMPARISON:  03/25/2021. FINDINGS: Endotracheal tube, Swan-Ganz catheter, mediastinal drainage catheter, left chest tube in stable position. No pneumothorax. Prior CABG. Heart size stable. Prominent Rose lower lobe atelectasis and consolidation noted on today's exam. Small Rose pleural effusion. No pneumothorax. Mild gastric distention. IMPRESSION: 1.  Lines and tubes in stable position. 2.  Prior CABG.  Heart size stable. 3. Prominent Rose lower lobe atelectasis and consolidation noted on today's exam. Small Rose pleural effusion. 3.  Mild gastric distention. Electronically Signed   By: Marcello Moores  Register   On: 03/30/2021 07:44   DG CHEST PORT 1 VIEW  Result Date: 04/16/2021 CLINICAL DATA:  Intubated EXAM: PORTABLE CHEST 1 VIEW COMPARISON:  02/28/2021, 03/22/2021, CT 03/22/2021 FINDINGS: Rose IJ Swan-Ganz catheter with tip overlying the main pulmonary artery confluence. Endotracheal tube tip about 4.1 cm superior to the carina. Post sternotomy changes in addition to chest and mediastinal drainage catheters. Cardiomegaly with vascular congestion. No pleural effusion, focal airspace disease, or pneumothorax. Esophageal tube has been removed. IMPRESSION: 1. Endotracheal tube tip about 4.1 cm superior to carina 2. Removal of esophageal tube 3. Cardiomegaly with vascular congestion Electronically Signed   By: Maudie Mercury  Francoise Ceo M.D.   On: 04/13/2021 02:30   DG Chest Port 1 View  Result Date:  03/14/2021 CLINICAL DATA:  Status post CABG x4. EXAM: PORTABLE CHEST 1 VIEW COMPARISON:  Mar 22, 2021. FINDINGS: Status post CABG with median sternotomy. Endotracheal tube tip at the level of the clavicular heads. Rose IJ approach Swan-Ganz catheter with the tip projecting at the expected location of the main pulmonary artery. Bilateral chest tubes with tips projecting along the lateral lungs bilaterally. Gastric tube courses below the diaphragm with the tip outside the view. Side port projects at the expected location of the stomach. Mediastinal drain. Opacity along the lateral left midlung in the region of the left chest tube tip, which may reflect contusion or atelectasis. No visible pleural effusions or pneumothorax on this single AP semi-erect radiograph. IMPRESSION: 1. Status post CABG with support devices as detailed above. 2. Opacity along the lateral left midlung in the region of the left chest tube tip, which may reflect contusion or atelectasis. Recommend attention on follow-up Electronically Signed   By: Margaretha Sheffield MD   On: 03/10/2021 13:18   DG Chest Port 1 View  Result Date: 03/22/2021 CLINICAL DATA:  Chest pain EXAM: PORTABLE CHEST 1 VIEW COMPARISON:  02/28/2021 FINDINGS: Cardiac shadow is at the upper limits of normal in size. Aortic calcifications are noted. Lungs are well aerated bilaterally. No focal infiltrate or effusion is seen. No bony abnormality is noted. Upper abdomen appears within normal limits. IMPRESSION: No acute abnormality noted. Aortic Atherosclerosis (ICD10-I70.0). Electronically Signed   By: Inez Catalina M.D.   On: 03/22/2021 17:08   ECHO INTRAOPERATIVE TEE  Result Date: 02/25/2021  *INTRAOPERATIVE TRANSESOPHAGEAL REPORT *  Patient Name:   David Rose. Date of Exam: 02/27/2021 Medical Rec #:  [id number]           Height:       74.0 in Accession #:    [id number]          Weight:       157.4 lb Date of Birth:  1966-03-29            BSA:          1.96 m Patient  Age:    25 years            BP:           112/54 mmHg Patient Gender: M                   HR:           54 bpm. Exam Location:  Anesthesiology Transesophogeal exam was perform intraoperatively during surgical procedure. Patient was closely monitored under general anesthesia during the entirety of examination. Indications:     CAD Sonographer:     Dustin Flock RDCS Performing Phys: Suella Broad MD Diagnosing Phys: Suella Broad MD Complications: No known complications during this procedure. POST-OP IMPRESSIONS     s/p CABG x4 - Left Ventricle: LVEF 40 - 45%, diffuse hypokinesis with no RWMA's. CO > 4L/min, CI > 2.5L/min/m2. - Rose Ventricle: Mildly improved with vasopressor support. - Aorta: No dissection noted after cannula removed. Stable plaque burden. - Left Atrium: The left atrium appears unchanged from pre-bypass.  - Aortic Valve: The aortic valve appears unchanged from pre-bypass. - Mitral Valve: The mitral valve appears unchanged from pre-bypass. - Tricuspid Valve: The tricuspid valve appears unchanged from pre-bypass.  PRE-OP FINDINGS  Left Ventricle: The left ventricle has mild-moderately reduced systolic function,  with an ejection fraction of 40-45%. The cavity size was mildly dilated. The left ventricular wall thickness was not assessed. Left ventrical global hypokinesis without regional wall motion abnormalities. There is no left ventricular hypertrophy. Left ventricular diastolic function could not be evaluated. Rose Ventricle: The Rose ventricle has mildly reduced systolic function. The cavity was dialated. There is no increase in Rose ventricular wall thickness. There is no aneurysm seen. Left Atrium: Left atrial size was normal in size. No left atrial/left atrial appendage thrombus was detected. The left atrial appendage is well visualized and there is no evidence of thrombus present. Rose Atrium: Rose atrial size was normal in size. PA catheter traversing the Rose atrium into the Rose  ventricle. Interatrial Septum: No atrial level shunt detected by color flow Doppler. There is no evidence of a patent foramen ovale. Pericardium: There is no evidence of pericardial effusion. There is a pleural effusion in the left lateral region. Mitral Valve: The mitral valve is normal in structure. Mitral valve regurgitation is trivial by color flow Doppler. The MR jet is centrally-directed. There is no evidence of mitral valve vegetation. There is No evidence of mitral stenosis. Tricuspid Valve: The tricuspid valve was normal in structure. Tricuspid valve regurgitation is trivial by color flow Doppler. No evidence of tricuspid stenosis is present. There is no evidence of tricuspid valve vegetation. Aortic Valve: The aortic valve is tricuspid Aortic valve regurgitation was not visualized by color flow Doppler. There is no stenosis of the aortic valve. There is no evidence of aortic valve vegetation. There is moderate thickening and mild calcification present on the aortic valve Rose coronary, left coronary and non-coronary cusps with normal mobility. Pulmonic Valve: The pulmonic valve was normal in structure, with normal. No evidence of pumonic stenosis. Pulmonic valve regurgitation is not visualized by color flow Doppler. Aorta: The aortic root, ascending aorta and aortic arch are normal in size and structure. There is evidence of plaque in the descending aorta; Grade II, measuring 2-85m in size. Pulmonary Artery: SGordy Councilmancatheter present on the Rose. The pulmonary artery is of normal size. Pulmonary hypertension is moderate. Venous: The inferior vena cava was not well visualized. Shunts: There is no evidence of an atrial septal defect.  KSuella BroadMD Electronically signed by KSuella BroadMD Signature Date/Time: 03/14/2021/1:43:52 PM    Final     Labs: BMET Recent Labs  Lab 03/20/21 0019 03/21/21 0125 03/22/21 [id number]/30/22 1631 03/09/2021 0430 03/14/2021 [id number]/07/2021 0913 03/14/2021 0933  03/13/2021 1004 03/01/2021 1038 03/13/2021 1125 03/07/2021 1134 03/04/2021 1800 02/25/2021 2018 04/18/2021 0011 04/17/2021 0213 04/10/2021 0223 03/25/2021 0241 03/29/2021 0714 03/30/2021 0720 04/09/2021 0757  NA 134* 134*  --  134* 134*   < > 136   < > 137   < > 139   < > 138   < > 143 140 142 143 142 143 144  144  K 4.0 3.8  --  3.9 3.3*   < > 2.9*   < > 4.3   < > 3.8   < > 3.4*   < > 4.1 5.7* 5.0 3.7 3.5 3.5 3.1*  3.1*  CL 99 97*  --  97* 97*   < > 98  --  98  --  99  --  105  --   --  103  --   --  103  --  104  CO2 27 28  --  27 27  --   --   --   --   --   --   --  19*  --   --  21*  --   --  18*  --   --   GLUCOSE 148* 141*  --  134* 112*   < > 111*  --  104*  --  151*  --  158*  --   --  252*  --   --  280*  --  264*  BUN 31* 18  --  32* 15   < > 17  --  17  --  17  --  17  --   --  20  --   --  22*  --  20  CREATININE 5.21* 3.60*   < > 6.04* 3.63*   < > 3.80*  --  3.50*  --  3.60*  --  3.50*  --   --  3.56*  --   --  3.51*  --  3.10*  CALCIUM 8.3* 8.6*  --  8.6* 8.4*  --   --   --   --   --   --   --  7.4*  --   --  7.5*  --   --  8.0*  --   --   PHOS  --  3.3  --   --   --   --   --   --   --   --   --   --   --   --   --   --   --   --   --   --   --    < > = values in this interval not displayed.   CBC Recent Labs  Lab 02/25/2021 1305 03/07/2021 1309 03/12/2021 1800 03/12/2021 2018 04/09/2021 0213 04/07/2021 0223 04/21/2021 0241 03/26/2021 0714 04/20/2021 0720 04/10/2021 0757  WBC 16.7*  --  21.3*  --  15.8*  --   --  10.8*  --   --   HGB 9.5*   < > 6.4*   < > 9.2*   < > 7.1* 9.9* 8.8* 7.8*  8.2*  HCT 29.7*   < > 20.6*   < > 27.4*   < > 21.0* 29.6* 26.0* 23.0*  24.0*  MCV 101.7*  --  103.0*  --  92.9  --   --  90.0  --   --   PLT 158  --  248  --  164  --   --  64*  --   --    < > = values in this interval not displayed.   Medications:    . sodium chloride   Intravenous Once  . sodium chloride   Intravenous Once  . sodium chloride   Intravenous Once  . sodium chloride   Intravenous Once  . sodium  chloride   Intravenous Once  . sodium chloride   Intravenous Once  . acetaminophen  1,000 mg Oral Q6H   Or  . acetaminophen (TYLENOL) oral liquid 160 mg/5 mL  1,000 mg Per Tube Q6H  . aspirin EC  325 mg Oral Daily   Or  . aspirin  324 mg Per Tube Daily  . atorvastatin  40 mg Oral Daily  . bisacodyl  10 mg Oral Daily   Or  . bisacodyl  10 mg Rectal Daily  . Chlorhexidine Gluconate Cloth  6 each Topical Daily  . cinacalcet  30 mg Oral Q T,Th,Sa-HD  . [START ON 03/25/2021] darbepoetin (ARANESP) injection - DIALYSIS  60 mcg Intravenous Q Thu-HD  .  docusate sodium  200 mg Oral Daily  . methylPREDNISolone (SOLU-MEDROL) injection  125 mg Intravenous Q6H  . [START ON 03/25/2021] pantoprazole  40 mg Oral Daily  . sodium chloride flush  10-40 mL Intracatheter Q12H  . sodium chloride flush  3 mL Intravenous Q12H   Elmarie Shiley, MD 04/02/2021, 9:51 AM

## 2021-03-24 NOTE — Progress Notes (Signed)
EEG completed, results pending. 

## 2021-03-24 NOTE — Procedures (Signed)
Patient Name: David Rose.  MRN: [id number]  Epilepsy Attending: Lora Havens  Referring Physician/Provider: Dr Ina Homes Date: 04/13/2021 Duration: 23.04 mins  Patient history: 55yo M s/p cardiac arrest. EEG to evaluate for seizure  Level of alertness: lethargic  AEDs during EEG study: None  Technical aspects: This EEG study was done with scalp electrodes positioned according to the 10-20 International system of electrode placement. Electrical activity was acquired at a sampling rate of '500Hz'$  and reviewed with a high frequency filter of '70Hz'$  and a low frequency filter of '1Hz'$ . EEG data were recorded continuously and digitally stored.   Description: EEG showed continuous generalized 3 to 6 Hz theta-delta slowing.  Hyperventilation and photic stimulation were not performed.     Patient was noted to have head jerking and bilateral shoulder twitching. Concomitant EEG change before, during and after the event didn't show any EEG change to suggest seizure.   ABNORMALITY - Continuous slow, generalized  IMPRESSION: This study is suggestive of moderate to severe diffuse encephalopathy, nonspecific etiology. No seizures or epileptiform discharges were seen throughout the recording.  Patient was noted to have head jerking and bilateral shoulder twitching without concomitant EEG change and was most likely NOT epileptic.    Devanie Galanti Barbra Sarks

## 2021-03-24 NOTE — Progress Notes (Signed)
Shortly after being repositioned and sat up in bed, pt c/o dyspnea. His O2 sats dropped from 98% to 86%. O2 immediately turned up and RRT called.  Pt's O2 sat then dropped to the 70s and BP immediately dropped to 60s. Pt was pale, wasn't speaking, and a code blue was called. Pt then went PEA, then Vfib.CPR was immediately started and on-call cardiac surgeon was called.

## 2021-03-24 NOTE — Progress Notes (Signed)
Pt received multiple blood products emergently.  All products not scanned are listed below. PRBCs given at 0232 and completed at 0256 PRBCs given at 0237 and completed at 0300 FFP given at 0415 and completed at 0420 FFP given at 0420 and completed at 0429 PRBCs given at 0415 and completed at 0420 PRBCs given at 0421 and completed at [id number]

## 2021-03-24 NOTE — Transfer of Care (Signed)
Immediate Anesthesia Transfer of Care Note  Patient: David Rose.  Procedure(s) Performed: EXPLORATION POST OPERATIVE OPEN HEART (N/A Chest)  Patient Location: SICU  Anesthesia Type:General  Level of Consciousness: sedated and Patient remains intubated per anesthesia plan  Airway & Oxygen Therapy: Patient remains intubated per anesthesia plan and Patient placed on Ventilator (see vital sign flow sheet for setting)  Post-op Assessment: Report given to RN and Post -op Vital signs reviewed and stable  Post vital signs: Reviewed and stable  Last Vitals:  Vitals Value Taken Time  BP    Temp    Pulse    Resp    SpO2      Last Pain:  Vitals:   04/14/2021 0030  TempSrc: Core  PainSc:       Patients Stated Pain Goal: 0 ([id number])  Complications: No complications documented.

## 2021-03-24 NOTE — Consult Note (Signed)
NAME:  David Rose., MRN:  [id number], DOB:  11/22/1965, LOS: 9 ADMISSION DATE:  02/23/2021, CONSULTATION DATE:  04/21/2021 REFERRING MD:  Verl Blalock, CHIEF COMPLAINT:  code   History of Present Illness:  55 year old man w/ hx of DM2, HTN, PAD, ESRD, cirrhosis (? Cardiac) who presented for symptomatic ischemic cardiomyopathy.  Underwent pre-op optimization with CHF, GI, and nephrology teams then high risk CABG yesterday.  Overnight unfortunately developed respiratory distress followed by PEA and chest compressions. This caused increased bleeding around surgical sites, underwent mediastinal exploration this AM with some bleeding around mammary graft site and diffuse oozing.  Received multiple units of blood products overnight.  He is intubated, on pressors.  PCCM asked to assist with management.  Pertinent  Medical History  ESRD on HD Cardiac cirrhosis with portal hypertension IBS IDDM  Significant Hospital Events: Including procedures, antibiotic start and stop dates in addition to other pertinent events   . 5/23 admitted . 5/26 paracentesis 4L . 5/30 HD . 5/31 CABG . 5/31-6/1 code, OR for mediastinal exploration 2/2 hemorrhagic shock induced by CPR  Interim History / Subjective:  Consulted  Objective   Blood pressure (!) 108/41, pulse (!) 54, temperature (!) 93.02 F (33.9 C), resp. rate (!) 24, height '6\' 2"'$  (1.88 m), weight 71.4 kg, SpO2 99 %. PAP: (26-77)/(14-42) 28/19 CO:  [3.9 L/min-6.3 L/min] 4.3 L/min CI:  [2 L/min/m2-3.2 L/min/m2] 2.2 L/min/m2  Vent Mode: PRVC FiO2 (%):  [40 %-100 %] 40 % Set Rate:  [4 bmp-24 bmp] 24 bmp Vt Set:  [650 mL] 650 mL PEEP:  [5 cmH20] 5 cmH20 Pressure Support:  [5 cmH20-10 cmH20] 5 cmH20 Plateau Pressure:  [14 cmH20-24 cmH20] 24 cmH20   Intake/Output Summary (Last 24 hours) at 04/20/2021 0730 Last data filed at 03/29/2021 [id number] Gross per 24 hour  Intake 14806.97 ml  Output 9595 ml  Net 5211.97 ml   Filed Weights   03/21/21 0300 03/22/21 0409  03/08/2021 0500  Weight: 73 kg 73.5 kg 71.4 kg    Examination: Constitutional: ill appearing man on vent  Eyes: pupils unequal, similar to pre-op Ears, nose, mouth, and throat: ETT in place, minimal secretions Cardiovascular: Distant, paced on monitor, ext lukewarm Respiratory: diminished R base, simple appearing moderate R effusion on [place], L mediastinal drain with 1L blood Gastrointestinal: Soft, +BS Skin: No rashes, normal turgor Neurologic: Sedated/paralyzed from OR Psychiatric: cannot assess   Labs/imaging that I havepersonally reviewed  (right click and "Reselect all SmartList Selections" daily)  CXR R effusion ABG looks good K slightly low INR 1.8   Resolved Hospital Problem list   n/a  Assessment & Plan:  In hospital cardiac arrest precipitated by respiratory distress- PEA, minimal downtime. CPR induced mediastinal hemorrhage- post washout 6/1; ongoing oozing related to coagulopathy Cirrhotic and blood-loss induced coagulopathy Hemorrhagic Shock Acute hypoxemic respiratory failure- CXR looks pretty good other than developing R effusion CAD post CABG Cardiac Cirrhosis with ascites ESRD on HD PTA DM2 - on insulin gtt HTN - Factor 7, protamine, FFP, cryo given - pRBCs x 3 ordered, try to match chest tube output since labs will be delayed - Keep patient warm, correct any acidemia - Pigtail to R chest, question developing hepatic hydrothorax - Vent support, VAP prevention bundle - Insulin gtt - Abx ppx, pressors per TCTS and CHF team - CRRT later today if stabilizes  Best practice (right click and "Reselect all SmartList Selections" daily)  Diet:  NPO Pain/Anxiety/Delirium protocol (if indicated): Yes (RASS goal -  1,-2) VAP protocol (if indicated): Yes DVT prophylaxis: Contraindicated GI prophylaxis: PPI Glucose control:  Insulin gtt Central venous access:  Yes, and it is still needed Arterial line:  Yes, and it is still needed Foley:  Yes, and it is still  needed Mobility:  bed rest  PT consulted: Yes Last date of multidisciplinary goals of care discussion [per primary] Code Status:  full code Disposition: per primary  Labs   CBC: Recent Labs  Lab 03/22/21 0636 02/23/2021 0430 03/01/2021 0759 02/28/2021 1018 03/22/2021 1038 02/26/2021 1305 03/14/2021 1309 02/27/2021 1800 02/21/2021 2018 04/06/2021 0011 03/30/2021 0213 04/17/2021 0223 04/04/2021 0241  WBC 8.0 8.2  --   --   --  16.7*  --  21.3*  --   --  15.8*  --   --   HGB 10.3* 10.6*   < > 8.3*   < > 9.5*   < > 6.4* 7.5* 8.5* 9.2* 8.2* 7.1*  HCT 32.6* 33.8*   < > 25.8*   < > 29.7*   < > 20.6* 22.0* 25.0* 27.4* 24.0* 21.0*  MCV 101.2* 100.9*  --   --   --  101.7*  --  103.0*  --   --  92.9  --   --   PLT 178 188  --  218  --  158  --  248  --   --  164  --   --    < > = values in this interval not displayed.    Basic Metabolic Panel: Recent Labs  Lab 03/21/21 0125 03/22/21 0636 03/22/21 1631 02/25/2021 0430 02/21/2021 0759 03/19/2021 0913 03/19/2021 0933 03/02/2021 1004 02/21/2021 1038 02/25/2021 1125 03/08/2021 1134 03/09/2021 1800 03/22/2021 2018 04/17/2021 0011 03/25/2021 0213 03/26/2021 0223 03/28/2021 0241  NA 134*  --  134* 134*   < > 136   < > 137   < > 139   < > 138 143 143 140 142 143  K 3.8  --  3.9 3.3*   < > 2.9*   < > 4.3   < > 3.8   < > 3.4* 3.1* 4.1 5.7* 5.0 3.7  CL 97*  --  97* 97*   < > 98  --  98  --  99  --  105  --   --  103  --   --   CO2 28  --  27 27  --   --   --   --   --   --   --  19*  --   --  21*  --   --   GLUCOSE 141*  --  134* 112*   < > 111*  --  104*  --  151*  --  158*  --   --  252*  --   --   BUN 18  --  32* 15   < > 17  --  17  --  17  --  17  --   --  20  --   --   CREATININE 3.60*   < > 6.04* 3.63*   < > 3.80*  --  3.50*  --  3.60*  --  3.50*  --   --  3.56*  --   --   CALCIUM 8.6*  --  8.6* 8.4*  --   --   --   --   --   --   --  7.4*  --   --  7.5*  --   --  MG  --   --   --   --   --   --   --   --   --   --   --  1.9  --   --  2.6*  --   --   PHOS 3.3  --   --    --   --   --   --   --   --   --   --   --   --   --   --   --   --    < > = values in this interval not displayed.   GFR: Estimated Creatinine Clearance: 23.7 mL/min (A) (by C-G formula based on SCr of 3.56 mg/dL (H)). Recent Labs  Lab 02/28/2021 0430 03/07/2021 1305 02/28/2021 1800 03/26/2021 0213  WBC 8.2 16.7* 21.3* 15.8*    Liver Function Tests: Recent Labs  Lab 03/21/21 0125 03/22/21 1631  AST  --  11*  ALT  --  10  ALKPHOS  --  90  BILITOT  --  0.6  PROT  --  7.1  ALBUMIN 3.3* 3.2*   No results for input(s): LIPASE, AMYLASE in the last 168 hours. No results for input(s): AMMONIA in the last 168 hours.  ABG    Component Value Date/Time   PHART 7.323 (L) 04/10/2021 0241   PCO2ART 39.5 03/26/2021 0241   PO2ART 373 (H) 03/29/2021 0241   HCO3 20.8 04/12/2021 0241   TCO2 22 04/04/2021 0241   ACIDBASEDEF 5.0 (H) 03/31/2021 0241   O2SAT 100.0 04/10/2021 0241     Coagulation Profile: Recent Labs  Lab 03/22/21 1631 03/05/2021 1305 03/22/2021 1722 03/28/2021 0241  INR 1.2 1.7* 1.4* 1.3*    Cardiac Enzymes: No results for input(s): CKTOTAL, CKMB, CKMBINDEX, TROPONINI in the last 168 hours.  HbA1C: Hgb A1c MFr Bld  Date/Time Value Ref Range Status  02/23/2021 03:45 PM 6.5 (H) 4.8 - 5.6 % Final    Comment:    (NOTE) Pre diabetes:          5.7%-6.4%  Diabetes:              >6.4%  Glycemic control for   <7.0% adults with diabetes     CBG: Recent Labs  Lab 04/02/2021 0009 03/29/2021 0116 04/10/2021 0212 04/20/2021 0238 04/12/2021 0317  GLUCAP 145* 161* 188* 235* 136*    Review of Systems:   Cannot assess, comtatose  Past Medical History:  He,  has a past medical history of ESRD (end stage renal disease) ([place]), Hypertension, Peripheral arterial disease ([place]), and Type 2 diabetes mellitus ([place]).   Surgical History:   Past Surgical History:  Procedure Laterality Date  . AV FISTULA PLACEMENT Right   . IR PARACENTESIS  03/18/2021  . PARACENTESIS    . RIGHT HEART  CATH N/A 03/14/2021   Procedure: RIGHT HEART CATH;  Surgeon: Larey Dresser, MD;  Location: [facility];  Service: Cardiovascular;  Laterality: N/A;  . RIGHT/LEFT HEART CATH AND CORONARY ANGIOGRAPHY N/A 02/24/2021   Procedure: RIGHT/LEFT HEART CATH AND CORONARY ANGIOGRAPHY;  Surgeon: Troy Sine, MD;  Location: [facility];  Service: Cardiovascular;  Laterality: N/A;     Social History:   reports that he has never smoked. He has never used smokeless tobacco.   Family History:  His family history includes Leukemia in his father; Thyroid cancer in his mother.   Allergies Allergies  Allergen Reactions  . Shellfish-Derived Products Anaphylaxis  .  Wasp Venom Protein Anaphylaxis  . Penicillins Other (See Comments)    Childhood reaction     Home Medications  Prior to Admission medications   Medication Sig Start Date End Date Taking? Authorizing Provider  Eluxadoline (VIBERZI) 75 MG TABS Take 75 mg by mouth in the morning and at bedtime.   Yes [provider]  furosemide (LASIX) 80 MG tablet Take 80 mg by mouth daily as needed for fluid. 02/20/20  Yes [provider]  lidocaine-prilocaine (EMLA) cream Apply 1 application topically as needed (before dialysis).   Yes [provider]  metoprolol succinate (TOPROL-XL) 50 MG 24 hr tablet Take 50 mg by mouth every evening. Take with or immediately following a meal.   Yes [provider]  nitroGLYCERIN (NITROSTAT) 0.4 MG SL tablet Place 1 tablet (0.4 mg total) under the tongue every 5 (five) minutes as needed. Patient taking differently: Place 0.4 mg under the tongue every 5 (five) minutes x 3 doses as needed for chest pain. 02/24/21  Yes Reino Bellis B, NP  sevelamer carbonate (RENVELA) 800 MG tablet Take 1,600 mg by mouth 3 (three) times daily with meals.   Yes [provider]     Critical care time: 45 minutes not including any separately billable procedures

## 2021-03-24 NOTE — Procedures (Signed)
L neck examined: no veins  Central Venous Catheter Insertion Procedure Note  David Rose  [id number]  1966/03/19  Date:03/31/2021  Time:9:17 AM   Provider Performing:Khallid Pasillas Loletha Grayer Tamala Julian   Procedure: Insertion of Non-tunneled Central Venous Catheter(36556)with US guidance [id number])    Indication(s) Hemodialysis  Consent Unable to obtain consent due to emergent nature of procedure.  Anesthesia Topical only with 1% lidocaine   Timeout Verified patient identification, verified procedure, site/side was marked, verified correct patient position, special equipment/implants available, medications/allergies/relevant history reviewed, required imaging and test results available.  Sterile Technique Maximal sterile technique including full sterile barrier drape, hand hygiene, sterile gown, sterile gloves, mask, hair covering, sterile ultrasound probe cover (if used).  Procedure Description Area of catheter insertion was cleaned with chlorhexidine and draped in sterile fashion.   With real-time ultrasound guidance a HD catheter was placed into the left femoral vein.  Nonpulsatile blood flow and easy flushing noted in all ports.  The catheter was sutured in place and sterile dressing applied.  Complications/Tolerance None; patient tolerated the procedure well. Chest X-ray is ordered to verify placement for internal jugular or subclavian cannulation.  Chest x-ray is not ordered for femoral cannulation.  EBL Minimal  Specimen(s) None

## 2021-03-24 NOTE — Code Documentation (Signed)
  Patient Name: David Rose.   MRN: [id number]   Date of Birth/ Sex: 1966/09/25 , male      Admission Date: 02/26/2021  Attending Provider: Wonda Olds, MD  Primary Diagnosis: <principal problem not specified>   Indication: Pt was in his usual state of health until this PM, when he was noted to be Apneic and entered PEA. Code blue was subsequently called. At the time of arrival on scene, ACLS protocol was underway.   Technical Description:  - CPR performance duration:  8 minutes  - Was defibrillation or cardioversion used? Yes   - Was external pacer placed? No  - Was patient intubated pre/post CPR? Yes   Medications Administered: Y = Yes; Blank = No Amiodarone    Atropine    Calcium    Epinephrine  Y  Lidocaine    Magnesium    Norepinephrine    Phenylephrine    Sodium bicarbonate  Y  Vasopressin     Post CPR evaluation:  - Final Status - Was patient successfully resuscitated ? Yes - What is current rhythm? Sinus Tachycardia - What is current hemodynamic status? Stable  Miscellaneous Information:  - Labs sent, including: I-Stat 7  - Primary team notified?  Yes  - Family Notified? Yes, called sister and made aware  - Additional notes/ transfer status:      Delora Fuel, MD  03/30/2021, 2:21 AM

## 2021-03-24 NOTE — Progress Notes (Signed)
RT called by code pager to code blue.  Pt recently extubated after CABG x4.  RT arrived to find unit RT attempting to intubate and RN bagging pt/compressions.  This RT took over bagging pt while unit RT attempted intubation.  Pt sats dropped to 27% at one point but unclear if the reading was accurate.  CRNA then showed up and placed ETT and pt was placed on ventilator at previous settings  and 100% after returning from [facility] [place].  Pt sat 100% at this time. RT will follow up with ABG after 30 mins. And await further instruction.

## 2021-03-24 NOTE — Hospital Course (Addendum)
HPI: This is a 55 year old man is seen for consideration of CABG.  He was originally seen in the outpatient setting with a recent diagnosis of multivessel coronary artery disease and biventricular dysfunction.  His left ventricular function is in the 40 to 45% range.  He also has a several year history of end-stage renal disease and is on hemodialysis.  This has been complicated by steal in the right upper extremity related to his AV fistula.  He also carries a diagnosis of hepatic cirrhosis.  Patient had previously undergone evaluation for renal transplant at [facility].  He is admitted for further work-up in anticipation of potential CABG. He has been tolerating hemodialysis since being admitted.   He underwent right heart catheterization and heart failure evaluation by Dr. Aundra Dubin to further assess right heart failure.  There was no evidence for left to right shunt.  He had a mildly elevated wedge pressure and moderately elevated RA pressure.   According to nephrology, he is optimized from a volume standpoint. He was also optimized from an advanced heart failure standpoint as well. Dr. Orvan Seen discussed the need for coronary artery bypass grafting surgery. Potential risks, benefits, and complications of the surgery were discussed and patient agree to proceed with surgery. Pre operative carotid duplex US showed no significant internal carotid artery stenosis bilaterally. He underwent a CABG x 4 on 02/21/2021.  Hospital Course: Patient was transferred in stable condition from the OR to [facility]. He was extubated early th morning of post operative day one. Shortly after, patient developed hypoxia, hypotension, and he went into PEA followed by ventricular fibrillation. Code blue was called and ACLS protocol was initiated. Patient was intubated and had good oxygenation. Dr. Orvan Seen evaluated patient and it was decided he needed to return to the OR for mediastinal exploration. He was coagulopathic. He received multiple PRBCs  and FFP.  Consultation has been obtained with cardiology, critical care medicine and nephrology to assist with management.  He has been placed on multiple pressors including epinephrine, norepinephrine, vasopressin as well as milrinone.  He has had postoperative atrial fibrillation and is placed on amiodarone drip with plans to eventually convert to oral dosing.   He has been placed on CVVH.  An HIT panel has been ordered but not done. Platelet count on 03/26/2021 was 42,000 and anticoagulation is being held currently.  Platelet count on 06/06 was up to 91,000. Thrombocytopenia did resolve as platelets on 06/09 was up to 300,000. He was able to be extubated on 03/25/2021 and has been weaned to nasal cannula by 03/26/2021.   He was been weaned off Epinephrine.  He continues to require Levophed and Milrinone.  Milrinone was stopped on  6/07.  He was started on Sildenafil for pulmonary hypertension.  He was tolerating a regular diet and his feeding tube was removed on 6/5.  As his thrombocytopenia resolved, HIT was unlikely, he was started on a Heparin drip on 06/07 (as has atrial fibrillation/flutter). He was start on Amiodarone as well. He was put on Midodrine. Echo was done on 06/09 and showed LVEF 45-60%, basal to mid inferolateral severe hypokinesis, mild AV sclerosis but significant valvular disease, and trivial pericardial effusoin. He was put on Cefepime for HCAP/sepsis. His WBC went as high as 88,400. CT of abdomen and pelvis showed stigmata of cirrhosis, anasarca with moderate ascites,  moderate distention of the gallbladder, no gallbladder wall thickening, mild cardiomegaly, bilateral pleural effusions (possibly loculated on the left). He was found to have yeast in sputum. Infectious  disease was consulted. In addition to Cefepime, Flagyl and Eraxis were started. Midodrine was stopped on 06/10 but later restarted. Cortrak and temporary HD catheter were placed on 06/10. Patient self extubated and was re intubated on  06/12. He was then put on Vasopressin. Eraxis and Flagyl were stopped on 06/13. He was extubated the morning of 06/15. He was also started on Cefepime for suspected HCAP/septic shock. Vasopressin was stopped. Patient had a percutaneous tracheostomy with bronchoscopic guidance on 06/16. WBC is down to 24,400 on 06/16 and he is still on Cefepime.  The patient developed atrial fibrillation and was treated with Amiodarone.  He again developed fever with increase in white count.  He required increase in Levophed requirements.  Meropenem was started for sepsis coverage.  Trach trial was attempted, initially patient tolerated 12 hours, but was agitated and back on full vent support within 24 hours.  Repeat EEG was obtained and showed mild diffuse encephalopathy.  There was evidence of tube feedings leaking around his tracheostomy.  This resulted in cessation of tube feedings for concern of aspiration, in setting of CXR LLL opacity.  He was found to have a coiled cortrak tube.  This was unable to be advanced or repositioned.  Palliative care consult was obtained to outline goals of care.  The patient's family optimally decided to stop life prolonging interventions.

## 2021-03-24 NOTE — Progress Notes (Signed)
TCTS BRIEF SICU PROGRESS NOTE  Day of Surgery  S/P Procedure(s) (LRB): EXPLORATION POST OPERATIVE OPEN HEART (N/A)   Sedated on vent EEG reportedly w/ no signs of seizure activity and patient has intermittently followed some commands AV paced w/ somewhate labile BP but excellent C.O. and relatively low PA pressures on Epi, vasopressin and milrinone O2 sats 99% on 50% FiO2 Mediastinal chest tube output persists but thin serosanguinous, and significant serous output from right pleural tube Tolerating CRRT keeping volume even Most recent labs okay  Plan: Continue current plan  Rexene Alberts, MD 03/29/2021 5:47 PM

## 2021-03-24 NOTE — Procedures (Signed)
Cortrak  Person Inserting Tube:  Maylon Peppers C, RD Tube Type:  Cortrak - 43 inches Tube Location:  Left nare Secured by: Bridle Technique Used to Measure Tube Placement:  Documented cm marking at nare/ corner of mouth Cortrak Secured At:  96 cm    Cortrak Tube Team Note:  Consult received to place a Cortrak feeding tube.   X-ray is required, abdominal x-ray has been ordered by the Cortrak team. Please confirm tube placement before using the Cortrak tube.   If the tube becomes dislodged please keep the tube and contact the Cortrak team at [email or web address] ([email or web address]) for replacement.  If after hours and replacement cannot be delayed, place a NG tube and confirm placement with an abdominal x-ray.    Lockie Pares., RD, LDN, CNSC See AMiON for contact information

## 2021-03-24 NOTE — Brief Op Note (Signed)
04/06/2021  7:40 AM  PATIENT:  David Rose.  55 y.o. male  PRE-OPERATIVE DIAGNOSIS:  Bleeding s/p CABG  POST-OPERATIVE DIAGNOSIS:  same  PROCEDURE:  Procedure(s): EXPLORATION POST OPERATIVE OPEN HEART (N/A)  SURGEON:  Surgeon(s) and Role:    * Wonda Olds, MD - Primary  PHYSICIAN ASSISTANT: n/a  ASSISTANTS: staff   ANESTHESIA:   general  EBL:  1000 mL   BLOOD ADMINISTERED:per anes record  DRAINS: 3 Bard drains   LOCAL MEDICATIONS USED:  NONE  SPECIMEN:  No Specimen  DISPOSITION OF SPECIMEN:  N/A  COUNTS:  YES  TOURNIQUET:  * No tourniquets in log *  DICTATION: .Note written in EPIC  PLAN OF CARE: Admit to inpatient   PATIENT DISPOSITION:  ICU - intubated and critically ill.   Delay start of Pharmacological VTE agent (>24hrs) due to surgical blood loss or risk of bleeding: yes

## 2021-03-24 NOTE — Progress Notes (Addendum)
Patient ID: David Rose., male   DOB: 24-Sep-1966, 55 y.o.   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  Based on low EF and elevated PA pressure, right and left heart cath was done in 5/22.  This showed severe 3VD, high cardiac output with moderate pulmonary hypertension but low PVR.  He is planned for CABG, but admitted pre-op for optimization.    Subjective:    CABG x 4 on 5/31 with LIMA-LAD, SVG-RCA, SVG-OM, SVG-D.   Coagulopathic post-op with multiple blood products.  Extubated overnight but developed respiratory distress => PEA arrest. Reintubated.  ROSC with ACLS.  Returned to OR for mediastinal exploration.    Currently on epinephrine 2, NE 6, vasopressin 0.04, milrinone 0.375, amiodarone 60.   MAP stable. No weight yet. Currently a-paced, underlying ?ectopic atrial rhythm.   Swan numbers:  CVP not hooked up PA 47/22 CI 2.9  Abdominal US 5/23 w/ nodular hepatic parenchymal pattern with increased echogenicity consistent with the patient's known cirrhosis. No focal hepatic abnormality identified. Portal vein is patent. Moderate ascites. Gallstones noted.  - LFTs normal. INR 1.3   Paracentesis 4L on 5/26.   RHC Procedural Findings: Hemodynamics (mmHg) RA mean 14 RV 61/15 PA 64/25, mean 41 PCWP mean 16 Oxygen saturations: IVC/low RA 69% PA 72% AO 99% Cardiac Output (Fick) 6.82  Cardiac Index (Fick) 3.29 PVR 3.7 WU  Cardiac Output (Thermo) 6.36  Cardiac Index (Thermo) 3.07 PVR 3.9 WU PAPI 2.8  Objective:   Weight Range: 71.4 kg Body mass index is 20.21 kg/m.   Vital Signs:   Temp:  [93.02 F (33.9 C)-99.32 F (37.4 C)] 93.02 F (33.9 C) (06/01 0430) Pulse Rate:  [54-210] 54 (06/01  0415) Resp:  [0-34] 24 (06/01 0726) BP: (60-261)/(26-229) 108/41 (06/01 0430) SpO2:  [75 %-100 %] 99 % (06/01 0415) Arterial Line BP: (68-255)/(34-84) 109/62 (06/01 0430) FiO2 (%):  [40 %-100 %] 40 % (06/01 0723) Last BM Date: 03/20/2021  Weight change: Filed Weights   03/21/21 0300 03/22/21 0409 03/18/2021 0500  Weight: 73 kg 73.5 kg 71.4 kg    Intake/Output:   Intake/Output Summary (Last 24 hours) at 03/27/2021 0758 Last data filed at 04/18/2021 0716 Gross per 24 hour  Intake 14806.97 ml  Output 9595 ml  Net 5211.97 ml      Physical Exam    General: ventilated/sedated.  Neck: JVP 10 cm, no thyromegaly or thyroid nodule.  Lungs: Decreased at bases. CV: Nondisplaced PMI.  Heart regular S1/S2, no S3/S4, no murmur.  No peripheral edema.   Abdomen: Soft, nontender, no hepatosplenomegaly, no distention.  Skin: Intact without lesions or rashes.  Neurologic: Sedated on vent Extremities: No clubbing or cyanosis.  HEENT: Normal.    Telemetry   Paced in 80s (personally reviewed)  EKG    No new EKG to review   Labs    CBC Recent Labs    03/12/2021 1800 03/08/2021 2018 04/16/2021 0213 03/31/2021 0223 04/03/2021 0241  WBC 21.3*  --  15.8*  --   --   HGB 6.4*   < > 9.2* 8.2* 7.1*  HCT 20.6*   < > 27.4* 24.0* 21.0*  MCV 103.0*  --  92.9  --   --   PLT 248  --  164  --   --    < > = values in this interval not displayed.   Basic Metabolic Panel Recent Labs    02/25/2021 1800 03/18/2021 2018 04/01/2021 0213 03/27/2021 0223 04/16/2021 0241  NA 138   < > 140 142 143  K 3.4*   < > 5.7* 5.0 3.7  CL 105  --  103  --   --   CO2 19*  --  21*  --   --   GLUCOSE 158*  --  252*  --   --   BUN 17  --  20  --   --   CREATININE 3.50*  --  3.56*  --   --   CALCIUM 7.4*  --  7.5*  --   --   MG 1.9  --  2.6*  --   --    < > = values in this interval not displayed.   Liver Function Tests Recent Labs    03/22/21 1631  AST 11*  ALT 10  ALKPHOS 90  BILITOT 0.6  PROT 7.1  ALBUMIN 3.2*    No results for input(s): LIPASE, AMYLASE in the last 72 hours. Cardiac Enzymes No results for input(s): CKTOTAL, CKMB, CKMBINDEX, TROPONINI in the last 72 hours.  BNP: BNP (last 3 results) Recent Labs    03/22/2021 1545  BNP 1,127.8*    ProBNP (last 3 results) No results for input(s): PROBNP in the last 8760 hours.   D-Dimer No results for input(s): DDIMER in the last 72 hours. Hemoglobin A1C No results for input(s): HGBA1C in the last 72 hours. Fasting Lipid Panel No results for input(s): CHOL, HDL, LDLCALC, TRIG, CHOLHDL, LDLDIRECT in the last 72 hours. Thyroid Function Tests No results for input(s): TSH, T4TOTAL, T3FREE, THYROIDAB in the last 72 hours.  Invalid input(s): FREET3  Other results:   Imaging    DG CHEST PORT 1 VIEW  Result Date: 04/22/2021 CLINICAL DATA:  Status post open heart surgery. EXAM: PORTABLE CHEST 1 VIEW COMPARISON:  04/18/2021. FINDINGS: Endotracheal tube, Swan-Ganz catheter, mediastinal drainage catheter, left chest tube in stable position. No pneumothorax. Prior CABG. Heart size stable. Prominent right lower lobe atelectasis and consolidation noted on today's exam. Small right pleural effusion. No pneumothorax. Mild gastric distention. IMPRESSION: 1.  Lines and tubes in stable position. 2.  Prior CABG.  Heart size stable. 3. Prominent right lower lobe atelectasis and consolidation noted on today's exam. Small right pleural effusion. 3.  Mild gastric distention. Electronically Signed   By: Marcello Moores  Register   On: 04/14/2021 07:44   DG CHEST PORT 1 VIEW  Result Date: 04/01/2021 CLINICAL DATA:  Intubated EXAM: PORTABLE CHEST 1 VIEW COMPARISON:  03/20/2021, 03/22/2021, CT 03/14/2021 FINDINGS: Right IJ Swan-Ganz catheter with tip overlying the main pulmonary artery confluence. Endotracheal tube tip about 4.1 cm superior to the carina. Post sternotomy changes in addition to chest and mediastinal drainage catheters. Cardiomegaly with vascular congestion. No  pleural effusion, focal airspace disease, or pneumothorax. Esophageal tube has been removed. IMPRESSION: 1. Endotracheal tube tip about 4.1 cm superior to carina 2. Removal of esophageal tube 3. Cardiomegaly with vascular congestion Electronically Signed   By: Donavan Foil M.D.   On: 04/12/2021 02:30   DG Chest Port 1 View  Result Date: 02/27/2021 CLINICAL DATA:  Status post CABG x4. EXAM: PORTABLE CHEST 1 VIEW COMPARISON:  Mar 22, 2021. FINDINGS: Status post CABG with  median sternotomy. Endotracheal tube tip at the level of the clavicular heads. Right IJ approach Swan-Ganz catheter with the tip projecting at the expected location of the main pulmonary artery. Bilateral chest tubes with tips projecting along the lateral lungs bilaterally. Gastric tube courses below the diaphragm with the tip outside the view. Side port projects at the expected location of the stomach. Mediastinal drain. Opacity along the lateral left midlung in the region of the left chest tube tip, which may reflect contusion or atelectasis. No visible pleural effusions or pneumothorax on this single AP semi-erect radiograph. IMPRESSION: 1. Status post CABG with support devices as detailed above. 2. Opacity along the lateral left midlung in the region of the left chest tube tip, which may reflect contusion or atelectasis. Recommend attention on follow-up Electronically Signed   By: Margaretha Sheffield MD   On: 02/22/2021 13:18     Medications:     Scheduled Medications: . sodium chloride   Intravenous Once  . sodium chloride   Intravenous Once  . sodium chloride   Intravenous Once  . sodium chloride   Intravenous Once  . sodium chloride   Intravenous Once  . sodium chloride   Intravenous Once  . acetaminophen  1,000 mg Oral Q6H   Or  . acetaminophen (TYLENOL) oral liquid 160 mg/5 mL  1,000 mg Per Tube Q6H  . aspirin EC  325 mg Oral Daily   Or  . aspirin  324 mg Per Tube Daily  . atorvastatin  40 mg Oral Daily  . bisacodyl   10 mg Oral Daily   Or  . bisacodyl  10 mg Rectal Daily  . Chlorhexidine Gluconate Cloth  6 each Topical Daily  . cinacalcet  30 mg Oral Q T,Th,Sa-HD  . coagulation factor VIIa recomb  90 mcg/kg Intravenous Once  . [START ON 03/25/2021] darbepoetin (ARANESP) injection - DIALYSIS  60 mcg Intravenous Q Thu-HD  . docusate sodium  200 mg Oral Daily  . methylPREDNISolone (SOLU-MEDROL) injection  125 mg Intravenous Q6H  . [START ON 03/25/2021] pantoprazole  40 mg Oral Daily  . protamine  50 mg Intravenous Once  . sodium chloride flush  10-40 mL Intracatheter Q12H  . sodium chloride flush  3 mL Intravenous Q12H    Infusions: . sodium chloride 20 mL/hr at 04/22/2021 0400  . sodium chloride    . sodium chloride 10 mL/hr at 03/07/2021 1333  . sodium chloride    . amiodarone 60 mg/hr (04/16/2021 0441)  . amiodarone    . calcium gluconate    . desmopressin (DDAVP) IV for Bleeding    . dexmedetomidine (PRECEDEX) IV infusion 0.6 mcg/kg/hr (04/20/2021 0400)  . electrolyte-A 75 mL/hr at 04/20/2021 0400  . epinephrine 4 mcg/min (04/12/2021 0400)  . insulin 0.6 Units/hr (03/25/2021 0441)  . lactated ringers    . lactated ringers    . lactated ringers    . levofloxacin (LEVAQUIN) IV    . milrinone 0.375 mcg/kg/min (04/02/2021 [id number])  . nitroGLYCERIN Stopped (03/22/2021 1904)  . norepinephrine (LEVOPHED) Adult infusion 25 mcg/min (03/31/2021 0400)  . phenylephrine (NEO-SYNEPHRINE) Adult infusion Stopped (04/18/2021 0504)  . vasopressin 0.04 Units/min (04/08/2021 0441)    PRN Medications: sodium chloride, dextrose, lactated ringers, lidocaine (PF), metoprolol tartrate, midazolam, morphine injection, ondansetron (ZOFRAN) IV, oxyCODONE, sodium chloride flush, sodium chloride flush, traMADol     Assessment/Plan   1. CAD: Severe 3VD with decreased EF.  I reviewed films with Dr. Ellyn Hack, PCI would be possible but would be difficult/high risk  with multiple lesions and heavy calcification. 5/31 LIMA-LAD, SVG-RCA, SVG-OM,  SVG-D. - Continue ASA 81.  - Continue atorvastatin 40 mg daily.  2. Acute/chronic HF with mid range EF => cardiogenic shock post-CABG: Suspect ischemic cardiomyopathy.  Echo 4/22 with EF 40-45% with moderately decreased RV systolic function and PASP 99, moderate TR. There was a prominent component of RV failure with RA pressure elevated out of proportion to PCWP (CVP/PCWP 0.875 on RHC) but PAPI adequate at 2.8. Post-op had PEA arrest, currently on epinephrine 2, NE 6, vasopressin 0.04, milrinone 0.375 with stable MAP.  PA pressure 47/22 with adequate CI at 2.9.  - Follow co-ox.  - Wean pressors as able, continue milrinone.  - Will need to begin CVVH for slow volume removal, per renal.  3. ESRD: Suspected due to diabetes.  Volume up post-op with multiple blood products. - Will need CVVH for volume removal, per renal.   4. 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  The CO was not markedly high.  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 Oxygen saturation was 99% on RA pre-op, so no evidence for hepatopulmonary syndrome.  PA pressure only mildly elevated currently at 47/22 post-op.  - Continue milrinone 0.375 for now.  - Transition back to sildenafil eventually.    5. Cirrhosis: Noted on abdominal US from 2020.  H/o paracentesis.  Had workup at [facility] (though patient does not remember this), viral hepatitis labs negative.  They ended up think that the cirrhosis was cardiogenic.  Repeat abdominal US 5/23 w/ nodular hepatic parenchymal pattern with increased echogenicity consistent with the patient's known cirrhosis. No focal hepatic abnormality identified. Portal vein is patent. Moderate ascites. GI has seen, patient had 4L paracentesis on 5/26. 6. Coagulopathy: Post-op bleeding in setting of coagulopathy post-CABG in patient with cirrhosis.  Currently seems controlled.  7. VT: Had after PEA  arrest am 6/1, now on amiodarone gtt.  8. Anemia: ABLA.  Follow CBC and transfuse as needed.  9. Pleural effusions: Right effusion, to get chest tube this morning.   10. PEA arrest: Respiratory arrest post-extubation.  ROSC with CPR.  11. Acute hypoxemic respiratory failure: Intubated, has RLL consolidation on CXR.   - Per CCM.   Discussed with Dr. Orvan Seen.   CRITICAL CARE Performed by: Loralie Champagne  Total critical care time: 40 minutes  Critical care time was exclusive of separately billable procedures and treating other patients.  Critical care was necessary to treat or prevent imminent or life-threatening deterioration.  Critical care was time spent personally by me on the following activities: development of treatment plan with patient and/or surrogate as well as nursing, discussions with consultants, evaluation of patient's response to treatment, examination of patient, obtaining history from patient or surrogate, ordering and performing treatments and interventions, ordering and review of laboratory studies, ordering and review of radiographic studies, pulse oximetry and re-evaluation of patient's condition.   Length of Stay: 9  Loralie Champagne, MD  04/12/2021, 7:58 AM  Advanced Heart Failure Team Pager [phone] (M-F; 7a - 5p)  Please contact [facility] for night-coverage after hours (5p -7a ) and weekends on [email or web address]

## 2021-03-24 NOTE — Procedures (Signed)
Insertion of Chest Tube Procedure Note  David Rose  [id number]  [birth year]  Date:04/16/2021  Time:8:40 AM    Provider Performing: Candee Furbish   Procedure: Pleural Catheter Insertion w/ Imaging Guidance [phone])  Indication(s) Effusion  Consent Unable to obtain consent due to emergent nature of procedure.  Anesthesia Topical only with 1% lidocaine    Time Out Verified patient identification, verified procedure, site/side was marked, verified correct patient position, special equipment/implants available, medications/allergies/relevant history reviewed, required imaging and test results available.   Sterile Technique Maximal sterile technique including full sterile barrier drape, hand hygiene, sterile gown, sterile gloves, mask, hair covering, sterile ultrasound probe cover (if used).   Procedure Description Ultrasound used to identify appropriate pleural anatomy for placement and overlying skin marked. Area of placement cleaned and draped in sterile fashion.  A 14 French pigtail pleural catheter was placed into the right pleural space using Seldinger technique. Appropriate return of fluid was obtained.  The tube was connected to atrium and placed on -20 cm H2O wall suction.   Complications/Tolerance None; patient tolerated the procedure well. Chest X-ray is ordered to verify placement.   EBL Minimal  Specimen(s) none

## 2021-03-24 NOTE — Procedures (Signed)
Extubation Procedure Note  Patient Details:   Name: David Rose. DOB: 1966-08-25 MRN: [id number]   Airway Documentation:    Vent end date: 03/30/2021 Vent end time: 0130   Evaluation  O2 sats: stable throughout Complications: No apparent complications Patient did tolerate procedure well. Bilateral Breath Sounds: Clear,Diminished    Yes   Patient performed NIF -25 and FVC .95L. RT extubated patient to 4L [place]. Patient able to verbalize his name. Patient performed 248m on IS with good effort. No respiratory distress noted.   JPatsy BaltimoreMeeks 03/29/2021, 1:37 AM

## 2021-03-24 NOTE — Progress Notes (Addendum)
     [address]       [place] [address]             [phone]       Called for respiratory code @ 2am Pt has been coagulopathic overnight.  Has received multiple units of product, 6 units of pRBCs, and Factor 7.  Bleeding decreased, and pt subsequently was extubated.  Shortly after extubated, pt apparrently had a respiratory code, and upon repositioning, dumped 1L of blood into the pleurovac.  After 2 rounds of ACLS, a perfusing rhythm was re-established.  Pt remains on high dose chemical support.   Transfusing 2 units of blood, and likely more Will cardiovert due to afib, and start [place]   The patient continues to have elevated chest tube output; discussed with family. Will take back to OR this AM for mediastinal exploration. Additional labs checked, corrections made. CXR without retained hemothorax, cardiac output remains adequate. Lamyia Cdebaca Z. Orvan Seen, [place]

## 2021-03-24 NOTE — Progress Notes (Signed)
Initial Nutrition Assessment  DOCUMENTATION CODES:   Severe malnutrition in context of chronic illness  INTERVENTION:   Once able to start tube feeding:  -Vital 1.5 @ 20 ml/hr via Cortrak  -Advance by 10 ml Q6 hours to goal rate of 50 ml/hr (1200 ml) -ProSource TF 45 ml BID  At goal rate TF provides: 1960 kcals, 125 grams protein, 917 ml free water.   Add B complex with vitamin C to account for losses on CRRT  NUTRITION DIAGNOSIS:   Severe Malnutrition related to chronic illness (CAD/CHF/ESRD) as evidenced by severe fat depletion,severe muscle depletion.  GOAL:   Patient will meet greater than or equal to 90% of their needs  MONITOR:   Vent status,Skin,TF tolerance,Weight trends,Labs,I & O's  REASON FOR ASSESSMENT:   Consult Enteral/tube feeding initiation and management  ASSESSMENT:   Patient with PMH significant for DM, HTN, PAD, CHF, CAD, ESRD, cirrhosis, and IBS. Presents this admission for CABG.   5/22- s/p RHC, severe 3vd, high cardiac output 5/26- paracentesis- drained 4L 5/31- s/p CABG 6/01- code, hemorrhagic shock, back to OR for mediastinal exploration  Pt discussed during ICU rounds and with RN.   Requiring pressors. On insulin drip. Plan to start CRRT once stabilized. Possibly returning to OR tomorrow. Gastric Cortrak placed at bedside. Noted large stomach with moderate gaseous distention. Plan to start tube feeding tomorrow per CCM. Titrate to goal.   Noted patient eating well prior to intubation with meal completions charted as 75-100%. Patient now under EDW and meets criteria for malnutrition.   EDW: 78.5 kg  Current weight: 71.4 kg   Patient is currently intubated on ventilator support MV: 15.2 L/min Temp (24hrs), Avg:96.7 F (35.9 C), Min:92.48 F (33.6 C), Max:99.32 F (37.4 C)  UOP: 520 ml x 24 hrs Chest tubes: 5210 ml x 24 hrs   Drips: precedex, insulin, vasopressin Medications: dulcolax, sensipar, aranesp, colace Labs: K 2.9 (L) CBG  152-232   NUTRITION - FOCUSED PHYSICAL EXAM:  Flowsheet Row Most Recent Value  Orbital Region Severe depletion  Upper Arm Region Moderate depletion  Thoracic and Lumbar Region Unable to assess  Buccal Region Severe depletion  Temple Region Severe depletion  Clavicle Bone Region Severe depletion  Clavicle and Acromion Bone Region Severe depletion  Scapular Bone Region Unable to assess  Dorsal Hand Moderate depletion  Patellar Region Severe depletion  Anterior Thigh Region Severe depletion  Posterior Calf Region Severe depletion  Edema (RD Assessment) Mild  [BLE]  Hair Reviewed  Eyes Unable to assess  Mouth Unable to assess  Skin Reviewed  Nails Reviewed     Diet Order:   Diet Order    None      EDUCATION NEEDS:   Not appropriate for education at this time  Skin:  Skin Assessment: Skin Integrity Issues: Skin Integrity Issues:: Incisions Incisions: R leg, chest  Last BM:  5/31  Height:   Ht Readings from Last 1 Encounters:  03/09/2021 '6\' 2"'$  (1.88 m)    Weight:   Wt Readings from Last 1 Encounters:  03/03/2021 71.4 kg   BMI:  Body mass index is 20.21 kg/m.  Estimated Nutritional Needs:   Kcal:  1800-2150 kcal  Protein:  120-140 grams  Fluid:  >/= 1.8 L/day  Mariana Single RD, LDN Clinical Nutrition Pager listed in [place]

## 2021-03-24 NOTE — Anesthesia Preprocedure Evaluation (Signed)
Anesthesia Evaluation  Patient identified by MRN, date of birth, ID band Patient unresponsive    Reviewed: Allergy & Precautions, Patient's Chart, lab work & pertinent test results, Unable to perform ROS - Chart review onlyPreop documentation limited or incomplete due to emergent nature of procedure.  History of Anesthesia Complications Negative for: history of anesthetic complications  Airway Mallampati: Intubated       Dental   Pulmonary neg pulmonary ROS,  Covid-19 Nucleic Acid Test Results Lab Results      Component                Value               Date                      SARSCOV2NAA              NEGATIVE            03/22/2021                [place]              NEGATIVE            02/22/2021              breath sounds clear to auscultation       Cardiovascular hypertension, Pt. on home beta blockers + CAD, + CABG and + Peripheral Vascular Disease   Rhythm:Regular  Mediastinal bleeding   Neuro/Psych negative neurological ROS  negative psych ROS   GI/Hepatic negative GI ROS, (+) Cirrhosis       , Lab Results      Component                Value               Date                      ALT                      10                  03/22/2021                AST                      11 (L)              03/22/2021                ALKPHOS                  90                  03/22/2021                BILITOT                  0.6                 03/22/2021              Endo/Other  diabetes, Type 2  Renal/GU ESRF and DialysisRenal disease     Musculoskeletal   Abdominal   Peds  Hematology  (+) anemia , Lab Results      Component  Value               Date                      WBC                      15.8 (H)            04/22/2021                HGB                      7.1 (L)             04/17/2021                HCT                      21.0 (L)            04/01/2021                MCV                       92.9                04/16/2021                PLT                      164                 04/20/2021           .[place] Lab Results      Component                Value               Date                      INR                      1.3 (H)             04/13/2021                INR                      1.4 (H)             02/26/2021                INR                      1.7 (H)             03/04/2021              Anesthesia Other Findings RUE fistula  Reproductive/Obstetrics                             Anesthesia Physical Anesthesia Plan  ASA: V and emergent  Anesthesia Plan: General   Post-op Pain Management:    Induction: Inhalational  PONV Risk Score and Plan: 2 and Treatment may vary due to age or medical condition  Airway Management Planned: Oral ETT  Additional Equipment: Arterial line and CVP  Intra-op Plan:   Post-operative Plan: Post-operative intubation/ventilation  Informed Consent:     History available from chart only and Only emergency history available  Plan Discussed with: CRNA and Surgeon  Anesthesia Plan Comments:         Anesthesia Quick Evaluation

## 2021-03-24 NOTE — Discharge Instructions (Signed)

## 2021-03-24 DEATH — deceased

## 2021-03-25 ENCOUNTER — Inpatient Hospital Stay (HOSPITAL_COMMUNITY): Payer: Medicare Other

## 2021-03-25 ENCOUNTER — Encounter (HOSPITAL_COMMUNITY): Payer: Self-pay | Admitting: Cardiothoracic Surgery

## 2021-03-25 DIAGNOSIS — T8119XA Other postprocedural shock, initial encounter: Secondary | ICD-10-CM

## 2021-03-25 DIAGNOSIS — Z9689 Presence of other specified functional implants: Secondary | ICD-10-CM

## 2021-03-25 DIAGNOSIS — J9601 Acute respiratory failure with hypoxia: Secondary | ICD-10-CM

## 2021-03-25 DIAGNOSIS — I469 Cardiac arrest, cause unspecified: Secondary | ICD-10-CM | POA: Diagnosis not present

## 2021-03-25 DIAGNOSIS — I5023 Acute on chronic systolic (congestive) heart failure: Secondary | ICD-10-CM | POA: Diagnosis not present

## 2021-03-25 DIAGNOSIS — E43 Unspecified severe protein-calorie malnutrition: Secondary | ICD-10-CM | POA: Insufficient documentation

## 2021-03-25 LAB — BPAM FFP
Blood Product Expiration Date: 202206032359
Blood Product Expiration Date: 202206032359
Blood Product Expiration Date: 202206062359
Blood Product Expiration Date: 202206062359
Blood Product Expiration Date: 202206062359
Blood Product Expiration Date: 202206062359
Blood Product Expiration Date: 202206062359
Blood Product Expiration Date: 202206062359
Blood Product Expiration Date: 202206062359
Blood Product Expiration Date: 202206062359
Blood Product Expiration Date: 202206062359
ISSUE DATE / TIME: 202206010402
ISSUE DATE / TIME: 202206010402
ISSUE DATE / TIME: 202206010500
ISSUE DATE / TIME: 202206010500
ISSUE DATE / TIME: 202206010500
ISSUE DATE / TIME: 202206010500
ISSUE DATE / TIME: 202206010707
ISSUE DATE / TIME: 202206010707
ISSUE DATE / TIME: 202206010707
ISSUE DATE / TIME: 202206010707
Unit Type and Rh: 6200
Unit Type and Rh: 6200
Unit Type and Rh: 600
Unit Type and Rh: 6200
Unit Type and Rh: 6200
Unit Type and Rh: 6200
Unit Type and Rh: 6200
Unit Type and Rh: 6200
Unit Type and Rh: 6200
Unit Type and Rh: 6200
Unit Type and Rh: 6200

## 2021-03-25 LAB — PREPARE FRESH FROZEN PLASMA
Unit division: 0
Unit division: 0
Unit division: 0
Unit division: 0
Unit division: 0
Unit division: 0
Unit division: 0

## 2021-03-25 LAB — PREPARE PLATELET PHERESIS: Unit division: 0

## 2021-03-25 LAB — DIC (DISSEMINATED INTRAVASCULAR COAGULATION)PANEL
D-Dimer, Quant: 2.34 ug/mL-FEU — ABNORMAL HIGH (ref 0.00–0.50)
D-Dimer, Quant: 2.27 ug/mL-FEU — ABNORMAL HIGH (ref 0.00–0.50)
D-Dimer, Quant: 1.76 ug/mL-FEU — ABNORMAL HIGH (ref 0.00–0.50)
D-Dimer, Quant: 1.96 ug/mL-FEU — ABNORMAL HIGH (ref 0.00–0.50)
Fibrinogen: 278 mg/dL (ref 210–475)
Fibrinogen: 290 mg/dL (ref 210–475)
Fibrinogen: 263 mg/dL (ref 210–475)
Fibrinogen: 310 mg/dL (ref 210–475)
INR: 1.4 — ABNORMAL HIGH (ref 0.8–1.2)
INR: 1.4 — ABNORMAL HIGH (ref 0.8–1.2)
INR: 1.5 — ABNORMAL HIGH (ref 0.8–1.2)
INR: 1.5 — ABNORMAL HIGH (ref 0.8–1.2)
Platelets: 58 10*3/uL — ABNORMAL LOW (ref 150–400)
Platelets: 62 10*3/uL — ABNORMAL LOW (ref 150–400)
Platelets: 43 10*3/uL — ABNORMAL LOW (ref 150–400)
Platelets: 46 10*3/uL — ABNORMAL LOW (ref 150–400)
Prothrombin Time: 17.1 seconds — ABNORMAL HIGH (ref 11.4–15.2)
Prothrombin Time: 17.4 seconds — ABNORMAL HIGH (ref 11.4–15.2)
Prothrombin Time: 18.2 seconds — ABNORMAL HIGH (ref 11.4–15.2)
Prothrombin Time: 18.4 seconds — ABNORMAL HIGH (ref 11.4–15.2)
Smear Review: NONE SEEN
Smear Review: NONE SEEN
Smear Review: NONE SEEN
Smear Review: NONE SEEN
aPTT: 36 seconds (ref 24–36)
aPTT: 36 seconds (ref 24–36)
aPTT: 36 seconds (ref 24–36)
aPTT: 39 seconds — ABNORMAL HIGH (ref 24–36)

## 2021-03-25 LAB — CBC
HCT: 28.9 % — ABNORMAL LOW (ref 39.0–52.0)
HCT: 30.1 % — ABNORMAL LOW (ref 39.0–52.0)
HCT: 28.5 % — ABNORMAL LOW (ref 39.0–52.0)
Hemoglobin: 10.5 g/dL — ABNORMAL LOW (ref 13.0–17.0)
Hemoglobin: 10.7 g/dL — ABNORMAL LOW (ref 13.0–17.0)
Hemoglobin: 9.7 g/dL — ABNORMAL LOW (ref 13.0–17.0)
MCH: 30.5 pg (ref 26.0–34.0)
MCH: 30.3 pg (ref 26.0–34.0)
MCH: 30.3 pg (ref 26.0–34.0)
MCHC: 36.3 g/dL — ABNORMAL HIGH (ref 30.0–36.0)
MCHC: 35.5 g/dL (ref 30.0–36.0)
MCHC: 34 g/dL (ref 30.0–36.0)
MCV: 84 fL (ref 80.0–100.0)
MCV: 85.3 fL (ref 80.0–100.0)
MCV: 89.1 fL (ref 80.0–100.0)
Platelets: 60 10*3/uL — ABNORMAL LOW (ref 150–400)
Platelets: 59 10*3/uL — ABNORMAL LOW (ref 150–400)
Platelets: 45 10*3/uL — ABNORMAL LOW (ref 150–400)
RBC: 3.44 MIL/uL — ABNORMAL LOW (ref 4.22–5.81)
RBC: 3.53 MIL/uL — ABNORMAL LOW (ref 4.22–5.81)
RBC: 3.2 MIL/uL — ABNORMAL LOW (ref 4.22–5.81)
RDW: 15.8 % — ABNORMAL HIGH (ref 11.5–15.5)
RDW: 16.4 % — ABNORMAL HIGH (ref 11.5–15.5)
RDW: 17.4 % — ABNORMAL HIGH (ref 11.5–15.5)
WBC: 14.2 10*3/uL — ABNORMAL HIGH (ref 4.0–10.5)
WBC: 14 10*3/uL — ABNORMAL HIGH (ref 4.0–10.5)
WBC: 15.8 10*3/uL — ABNORMAL HIGH (ref 4.0–10.5)
nRBC: 0 % (ref 0.0–0.2)
nRBC: 0 % (ref 0.0–0.2)
nRBC: 0 % (ref 0.0–0.2)

## 2021-03-25 LAB — RENAL FUNCTION PANEL
Albumin: 3.6 g/dL (ref 3.5–5.0)
Anion gap: 9 (ref 5–15)
BUN: 13 mg/dL (ref 6–20)
CO2: 27 mmol/L (ref 22–32)
Calcium: 8.3 mg/dL — ABNORMAL LOW (ref 8.9–10.3)
Chloride: 103 mmol/L (ref 98–111)
Creatinine, Ser: 1.95 mg/dL — ABNORMAL HIGH (ref 0.61–1.24)
GFR, Estimated: 40 mL/min — ABNORMAL LOW (ref >60)
Glucose, Bld: 115 mg/dL — ABNORMAL HIGH (ref 70–99)
Phosphorus: 4.3 mg/dL (ref 2.5–4.6)
Potassium: 4.7 mmol/L (ref 3.5–5.1)
Sodium: 139 mmol/L (ref 135–145)

## 2021-03-25 LAB — PREPARE CRYOPRECIPITATE: Unit division: 0

## 2021-03-25 LAB — COOXEMETRY PANEL
Carboxyhemoglobin: 1.1 % (ref 0.5–1.5)
Methemoglobin: 1.2 % (ref 0.0–1.5)
O2 Saturation: 78 %
Total hemoglobin: 11 g/dL — ABNORMAL LOW (ref 12.0–16.0)

## 2021-03-25 LAB — BASIC METABOLIC PANEL
Anion gap: 10 (ref 5–15)
BUN: 16 mg/dL (ref 6–20)
CO2: 22 mmol/L (ref 22–32)
Calcium: 8.1 mg/dL — ABNORMAL LOW (ref 8.9–10.3)
Chloride: 106 mmol/L (ref 98–111)
Creatinine, Ser: 2.21 mg/dL — ABNORMAL HIGH (ref 0.61–1.24)
GFR, Estimated: 34 mL/min — ABNORMAL LOW (ref >60)
Glucose, Bld: 159 mg/dL — ABNORMAL HIGH (ref 70–99)
Potassium: 3.8 mmol/L (ref 3.5–5.1)
Sodium: 138 mmol/L (ref 135–145)

## 2021-03-25 LAB — GLUCOSE, CAPILLARY
Glucose-Capillary: 100 mg/dL — ABNORMAL HIGH (ref 70–99)
Glucose-Capillary: 102 mg/dL — ABNORMAL HIGH (ref 70–99)
Glucose-Capillary: 103 mg/dL — ABNORMAL HIGH (ref 70–99)
Glucose-Capillary: 106 mg/dL — ABNORMAL HIGH (ref 70–99)
Glucose-Capillary: 112 mg/dL — ABNORMAL HIGH (ref 70–99)
Glucose-Capillary: 112 mg/dL — ABNORMAL HIGH (ref 70–99)
Glucose-Capillary: 120 mg/dL — ABNORMAL HIGH (ref 70–99)
Glucose-Capillary: 120 mg/dL — ABNORMAL HIGH (ref 70–99)
Glucose-Capillary: 123 mg/dL — ABNORMAL HIGH (ref 70–99)
Glucose-Capillary: 125 mg/dL — ABNORMAL HIGH (ref 70–99)
Glucose-Capillary: 126 mg/dL — ABNORMAL HIGH (ref 70–99)
Glucose-Capillary: 126 mg/dL — ABNORMAL HIGH (ref 70–99)
Glucose-Capillary: 141 mg/dL — ABNORMAL HIGH (ref 70–99)
Glucose-Capillary: 157 mg/dL — ABNORMAL HIGH (ref 70–99)
Glucose-Capillary: 87 mg/dL (ref 70–99)

## 2021-03-25 LAB — BPAM CRYOPRECIPITATE
Blood Product Expiration Date: 202206011013
ISSUE DATE / TIME: 202206010631
Unit Type and Rh: 6200

## 2021-03-25 LAB — COMPREHENSIVE METABOLIC PANEL
ALT: 14 U/L (ref 0–44)
AST: 34 U/L (ref 15–41)
Albumin: 3.1 g/dL — ABNORMAL LOW (ref 3.5–5.0)
Alkaline Phosphatase: 41 U/L (ref 38–126)
Anion gap: 11 (ref 5–15)
BUN: 16 mg/dL (ref 6–20)
CO2: 24 mmol/L (ref 22–32)
Calcium: 8.6 mg/dL — ABNORMAL LOW (ref 8.9–10.3)
Chloride: 103 mmol/L (ref 98–111)
Creatinine, Ser: 2.16 mg/dL — ABNORMAL HIGH (ref 0.61–1.24)
GFR, Estimated: 35 mL/min — ABNORMAL LOW (ref >60)
Glucose, Bld: 141 mg/dL — ABNORMAL HIGH (ref 70–99)
Potassium: 3.9 mmol/L (ref 3.5–5.1)
Sodium: 138 mmol/L (ref 135–145)
Total Bilirubin: 3.2 mg/dL — ABNORMAL HIGH (ref 0.3–1.2)
Total Protein: 5 g/dL — ABNORMAL LOW (ref 6.5–8.1)

## 2021-03-25 LAB — TRIGLYCERIDES: Triglycerides: 84 mg/dL (ref <150)

## 2021-03-25 LAB — BPAM PLATELET PHERESIS
Blood Product Expiration Date: 202206032359
ISSUE DATE / TIME: 202206012208
Unit Type and Rh: 7300

## 2021-03-25 LAB — MAGNESIUM: Magnesium: 2 mg/dL (ref 1.7–2.4)

## 2021-03-25 LAB — PHOSPHORUS: Phosphorus: 3 mg/dL (ref 2.5–4.6)

## 2021-03-25 MED ORDER — LIP MEDEX EX OINT
TOPICAL_OINTMENT | CUTANEOUS | Status: DC | PRN
  Filled 2021-03-25: qty 7

## 2021-03-25 MED ORDER — INSULIN ASPART 100 UNIT/ML IJ SOLN
0.0000 [IU] | INTRAMUSCULAR | Status: DC
  Administered 2021-03-25 – 2021-03-26 (×2): 2 [IU] via SUBCUTANEOUS
  Administered 2021-03-26: 4 [IU] via SUBCUTANEOUS
  Administered 2021-03-27 (×4): 2 [IU] via SUBCUTANEOUS
  Administered 2021-03-28 (×2): 4 [IU] via SUBCUTANEOUS
  Administered 2021-03-28: 2 [IU] via SUBCUTANEOUS
  Administered 2021-03-28: 4 [IU] via SUBCUTANEOUS
  Administered 2021-03-29: 8 [IU] via SUBCUTANEOUS
  Administered 2021-03-29: 4 [IU] via SUBCUTANEOUS
  Administered 2021-03-29 – 2021-03-30 (×4): 2 [IU] via SUBCUTANEOUS
  Administered 2021-03-30 – 2021-03-31 (×3): 8 [IU] via SUBCUTANEOUS
  Administered 2021-03-31: 4 [IU] via SUBCUTANEOUS
  Administered 2021-03-31 (×2): 2 [IU] via SUBCUTANEOUS
  Administered 2021-03-31: 8 [IU] via SUBCUTANEOUS
  Administered 2021-04-01 (×2): 12 [IU] via SUBCUTANEOUS
  Administered 2021-04-01 (×2): 2 [IU] via SUBCUTANEOUS
  Administered 2021-04-01: 4 [IU] via SUBCUTANEOUS
  Administered 2021-04-03 – 2021-04-06 (×8): 2 [IU] via SUBCUTANEOUS
  Administered 2021-04-06: 4 [IU] via SUBCUTANEOUS
  Administered 2021-04-06: 2 [IU] via SUBCUTANEOUS
  Administered 2021-04-06 (×2): 4 [IU] via SUBCUTANEOUS
  Administered 2021-04-06: 2 [IU] via SUBCUTANEOUS
  Administered 2021-04-07: 4 [IU] via SUBCUTANEOUS
  Administered 2021-04-07: 2 [IU] via SUBCUTANEOUS
  Administered 2021-04-07: 8 [IU] via SUBCUTANEOUS
  Administered 2021-04-07: 4 [IU] via SUBCUTANEOUS
  Administered 2021-04-07 – 2021-04-08 (×2): 8 [IU] via SUBCUTANEOUS
  Administered 2021-04-08 (×2): 2 [IU] via SUBCUTANEOUS
  Administered 2021-04-08: 4 [IU] via SUBCUTANEOUS
  Administered 2021-04-08: 2 [IU] via SUBCUTANEOUS
  Administered 2021-04-08: 4 [IU] via SUBCUTANEOUS
  Administered 2021-04-09: 2 [IU] via SUBCUTANEOUS
  Administered 2021-04-09: 4 [IU] via SUBCUTANEOUS
  Administered 2021-04-09: 2 [IU] via SUBCUTANEOUS
  Administered 2021-04-09: 8 [IU] via SUBCUTANEOUS
  Administered 2021-04-09: 4 [IU] via SUBCUTANEOUS
  Administered 2021-04-10: 2 [IU] via SUBCUTANEOUS
  Administered 2021-04-10: 12 [IU] via SUBCUTANEOUS
  Administered 2021-04-10: 2 [IU] via SUBCUTANEOUS
  Administered 2021-04-10 (×2): 8 [IU] via SUBCUTANEOUS
  Administered 2021-04-11 (×3): 4 [IU] via SUBCUTANEOUS
  Administered 2021-04-11: 8 [IU] via SUBCUTANEOUS
  Administered 2021-04-11: 4 [IU] via SUBCUTANEOUS
  Administered 2021-04-11: 2 [IU] via SUBCUTANEOUS
  Administered 2021-04-12: 8 [IU] via SUBCUTANEOUS
  Administered 2021-04-12: 4 [IU] via SUBCUTANEOUS
  Administered 2021-04-12: 8 [IU] via SUBCUTANEOUS
  Administered 2021-04-13 (×2): 2 [IU] via SUBCUTANEOUS
  Administered 2021-04-13 – 2021-04-14 (×2): 4 [IU] via SUBCUTANEOUS
  Administered 2021-04-14: 2 [IU] via SUBCUTANEOUS
  Administered 2021-04-14: 12 [IU] via SUBCUTANEOUS
  Administered 2021-04-14 (×2): 4 [IU] via SUBCUTANEOUS
  Administered 2021-04-14: 2 [IU] via SUBCUTANEOUS
  Administered 2021-04-15: 5 [IU] via SUBCUTANEOUS

## 2021-03-25 MED ORDER — MILRINONE LACTATE IN DEXTROSE 20-5 MG/100ML-% IV SOLN
0.1250 ug/kg/min | INTRAVENOUS | Status: DC
  Administered 2021-03-25: 0.25 ug/kg/min via INTRAVENOUS
  Administered 2021-03-26 – 2021-03-28 (×3): 0.125 ug/kg/min via INTRAVENOUS
  Filled 2021-03-25 (×4): qty 100

## 2021-03-25 MED ORDER — DARBEPOETIN ALFA 60 MCG/0.3ML IJ SOSY
60.0000 ug | PREFILLED_SYRINGE | Freq: Once | INTRAMUSCULAR | Status: AC
  Administered 2021-03-25: 60 ug via SUBCUTANEOUS
  Filled 2021-03-25: qty 0.3

## 2021-03-25 MED ORDER — ASPIRIN EC 81 MG PO TBEC
81.0000 mg | DELAYED_RELEASE_TABLET | Freq: Every day | ORAL | Status: DC
  Administered 2021-03-25 – 2021-04-01 (×8): 81 mg via ORAL
  Filled 2021-03-25 (×8): qty 1

## 2021-03-25 MED ORDER — ORAL CARE MOUTH RINSE
15.0000 mL | Freq: Two times a day (BID) | OROMUCOSAL | Status: DC
  Administered 2021-03-26 – 2021-04-01 (×11): 15 mL via OROMUCOSAL

## 2021-03-25 MED ORDER — PROSOURCE TF PO LIQD: 45.0000 mL | Freq: Two times a day (BID) | ORAL | Status: DC

## 2021-03-25 MED ORDER — VITAL 1.5 CAL PO LIQD
1000.0000 mL | ORAL | Status: DC
  Administered 2021-03-25: 1000 mL

## 2021-03-25 MED ORDER — ALBUMIN HUMAN 25 % IV SOLN
50.0000 g | Freq: Once | INTRAVENOUS | Status: AC
  Administered 2021-03-25 (×4): 12.5 g via INTRAVENOUS
  Filled 2021-03-25: qty 200

## 2021-03-25 MED ORDER — B COMPLEX-C PO TABS
1.0000 | ORAL_TABLET | Freq: Every day | ORAL | Status: DC
  Administered 2021-03-25 – 2021-03-31 (×7): 1 via ORAL
  Filled 2021-03-25 (×7): qty 1

## 2021-03-25 NOTE — Progress Notes (Signed)
1 Day Post-Op Procedure(s) (LRB): EXPLORATION POST OPERATIVE OPEN HEART (N/A)  POD #2 s/p CABG x 4 Subjective: No complaints; just extubated  Objective: Vital signs in last 24 hours: Temp:  [93.2 F (34 C)-97.9 F (36.6 C)] 96.98 F (36.1 C) (06/02 0800) Pulse Rate:  [44-127] 127 (06/02 0828) Cardiac Rhythm: Atrial fibrillation (06/02 0800) Resp:  [13-42] 31 (06/02 0828) BP: (68-172)/(24-78) 145/52 (06/02 0828) SpO2:  [87 %-100 %] 96 % (06/02 0828) Arterial Line BP: (68-200)/(37-71) 151/56 (06/02 0800) FiO2 (%):  [40 %-50 %] 40 % (06/02 0800) Weight:  [80.9 kg] 80.9 kg (06/02 0600)  Hemodynamic parameters for last 24 hours: PAP: (24-54)/(13-27) 50/27 CVP:  [0 mmHg-13 mmHg] 10 mmHg CO:  [4.6 L/min-8.4 L/min] 8.4 L/min CI:  [2.4 L/min/m2-4.3 L/min/m2] 4.3 L/min/m2  Intake/Output from previous day: 06/01 0701 - 06/02 0700 In: 3503.4 [I.V.:1616; Blood:1540; IV Piggyback:347.4] Out: 7303 [Urine:450; Chest Tube:5260] Intake/Output this shift: Total I/O In: 74.9 [I.V.:74.9] Out: 46 [Urine:22; Other:4; Chest Tube:20]  General appearance: alert and cooperative Neurologic: intact Heart: irregularly irregular rhythm Lungs: clear to auscultation bilaterally Abdomen: soft, non-tender; bowel sounds normal; no masses,  no organomegaly Extremities: edema mild Wound: c/d/i  Lab Results: Recent Labs    03/25/21 0004 03/25/21 0442  WBC 14.2* 14.0*  HGB 10.5* 10.7*  HCT 28.9* 30.1*  PLT 58*  60* 62*  59*   BMET:  Recent Labs    03/25/21 0004 03/25/21 0442  NA 138 138  K 3.8 3.9  CL 106 103  CO2 22 24  GLUCOSE 159* 141*  BUN 16 16  CREATININE 2.21* 2.16*  CALCIUM 8.1* 8.6*    PT/INR:  Recent Labs    03/25/21 0442  LABPROT 17.4*  INR 1.4*   ABG    Component Value Date/Time   PHART 7.453 (H) 03/28/2021 1402   HCO3 19.4 (L) 04/01/2021 1402   TCO2 20 (L) 03/26/2021 1402   ACIDBASEDEF 4.0 (H) 04/05/2021 1402   O2SAT 78.0 03/25/2021 0602   CBG (last 3)   Recent Labs    03/25/21 0608 03/25/21 0732 03/25/21 0842  GLUCAP 120* 87 126*    Assessment/Plan: S/P Procedure(s) (LRB): EXPLORATION POST OPERATIVE OPEN HEART (N/A) continue CVVHD  Wean drips  Leave chest tubes today   LOS: 10 days    Wonda Olds 03/25/2021

## 2021-03-25 NOTE — Progress Notes (Signed)
Patient ID: David Rose., male   DOB: 10/18/1966, 55 y.o.   MRN: [id number] [facility] Progress Note   Assessment/ Plan:   1.  Coronary artery disease: With previously documented severe three-vessel disease and decreased ejection fraction and yesterday had four-vessel CABG by Dr. Orvan Seen.  Previously suffered PEA arrest after extubation requiring resuscitation with CPR and required mediastinal wound exploration for bleeding.  He remains hemodynamically stable. 2.  End-stage renal disease: He is usually on a Tuesday/Thursday/Saturday dialysis schedule via right upper arm AV fistula and last had hemodialysis on 5/30.  Continue CRRT for the next 24 hours or system clotting-whichever comes first.  We will give 50 g of 25% albumin. 3.  Pulmonary hypertension/chronic systolic heart failure: Requiring volume resuscitation following bleeding/PEA arrest as well as products for anticoagulation.  With net negative fluid balance overnight. 4.  Anemia of chronic kidney disease: Hemoglobin and hematocrit within goal, continue to treat with ESA and monitor for overt losses. 5.  Chronic kidney disease/metabolic bone disease: Calcium and phosphorus level within acceptable range and on 3 times weekly Sensipar for PTH control. 6.  History of cirrhosis with recurrent ascites: Paracentesis periodically as indicated.  Subjective:   Tolerated CRRT overnight with net -3 L fluid balance (of which majority was from restraints with only net -1.5 L off with CRRT).   Objective:   BP (!) 68/24   Pulse 65   Temp (!) 96.98 F (36.1 C) (Core)   Resp (!) 39   Ht '6\' 2"'  (1.88 m)   Wt 80.9 kg   SpO2 100%   BMI 22.90 kg/m   Intake/Output Summary (Last 24 hours) at 03/25/2021 1275 Last data filed at 03/25/2021 0800 Gross per 24 hour  Intake 2223.36 ml  Output 5339 ml  Net -3115.64 ml   Weight change:   Physical Exam: Gen: Intubated, appears comfortable CVS: Pulse regular rhythm, normal rate, midline  sternal incision dressing intact, distant heart sounds Resp: Clear to auscultation bilaterally, no rales/rhonchi over anterior chest Abd: Soft, flat, nontender Ext: No lower extremity edema, right upper arm AV fistula with poor augmentation and low pitched bruit  Imaging: DG Chest 1 View  Result Date: 04/11/2021 CLINICAL DATA:  Status post chest tube placement. EXAM: CHEST  1 VIEW COMPARISON:  Single-view of the chest earlier today. FINDINGS: A new pigtail catheter projects in the right lower chest. Support tubes and lines are otherwise unchanged. Right effusion and airspace disease appear unchanged. No pneumothorax. The left lung is clear. Heart size is normal. IMPRESSION: New pigtail catheter projects in the lower right chest. No change in a right effusion and airspace disease. No pneumothorax or other new abnormality. Electronically Signed   By: Inge Rise M.D.   On: 03/31/2021 10:22   DG CHEST PORT 1 VIEW  Result Date: 03/25/2021 CLINICAL DATA:  Chest tube in place.  History of open heart surgery. EXAM: PORTABLE CHEST 1 VIEW COMPARISON:  March 24, 2021. FINDINGS: Endotracheal tube tip projects approximately 4.5 cm above the carina. Right IJ approach Swan-Ganz catheter, bilateral chest tubes, and mediastinal drain are in similar position. Enteric tube courses below the diaphragm in outside the field of view. Improved aeration of the right lung base with streaky left basilar opacities. Improved right pleural effusion. No visible pneumothorax on this single AP semi erect radiograph. IMPRESSION: 1. Improved right pleural effusion and right basilar opacities. Mild streaky left basilar opacities, most likely atelectasis in the postoperative setting. 2. Support devices, as detailed above.  Electronically Signed   By: Margaretha Sheffield MD   On: 03/25/2021 06:55   DG CHEST PORT 1 VIEW  Result Date: 04/02/2021 CLINICAL DATA:  Status post open heart surgery. EXAM: PORTABLE CHEST 1 VIEW COMPARISON:   03/26/2021. FINDINGS: Endotracheal tube, Swan-Ganz catheter, mediastinal drainage catheter, left chest tube in stable position. No pneumothorax. Prior CABG. Heart size stable. Prominent right lower lobe atelectasis and consolidation noted on today's exam. Small right pleural effusion. No pneumothorax. Mild gastric distention. IMPRESSION: 1.  Lines and tubes in stable position. 2.  Prior CABG.  Heart size stable. 3. Prominent right lower lobe atelectasis and consolidation noted on today's exam. Small right pleural effusion. 3.  Mild gastric distention. Electronically Signed   By: Marcello Moores  Register   On: 04/09/2021 07:44   DG CHEST PORT 1 VIEW  Result Date: 04/19/2021 CLINICAL DATA:  Intubated EXAM: PORTABLE CHEST 1 VIEW COMPARISON:  02/28/2021, 03/22/2021, CT 03/23/2021 FINDINGS: Right IJ Swan-Ganz catheter with tip overlying the main pulmonary artery confluence. Endotracheal tube tip about 4.1 cm superior to the carina. Post sternotomy changes in addition to chest and mediastinal drainage catheters. Cardiomegaly with vascular congestion. No pleural effusion, focal airspace disease, or pneumothorax. Esophageal tube has been removed. IMPRESSION: 1. Endotracheal tube tip about 4.1 cm superior to carina 2. Removal of esophageal tube 3. Cardiomegaly with vascular congestion Electronically Signed   By: Donavan Foil M.D.   On: 04/22/2021 02:30   DG Chest Port 1 View  Result Date: 03/18/2021 CLINICAL DATA:  Status post CABG x4. EXAM: PORTABLE CHEST 1 VIEW COMPARISON:  Mar 22, 2021. FINDINGS: Status post CABG with median sternotomy. Endotracheal tube tip at the level of the clavicular heads. Right IJ approach Swan-Ganz catheter with the tip projecting at the expected location of the main pulmonary artery. Bilateral chest tubes with tips projecting along the lateral lungs bilaterally. Gastric tube courses below the diaphragm with the tip outside the view. Side port projects at the expected location of the stomach.  Mediastinal drain. Opacity along the lateral left midlung in the region of the left chest tube tip, which may reflect contusion or atelectasis. No visible pleural effusions or pneumothorax on this single AP semi-erect radiograph. IMPRESSION: 1. Status post CABG with support devices as detailed above. 2. Opacity along the lateral left midlung in the region of the left chest tube tip, which may reflect contusion or atelectasis. Recommend attention on follow-up Electronically Signed   By: Margaretha Sheffield MD   On: 03/04/2021 13:18   DG Abd Portable 1V  Result Date: 04/10/2021 CLINICAL DATA:  Status post feeding tube placement. EXAM: PORTABLE ABDOMEN - 1 VIEW COMPARISON:  None. FINDINGS: Feeding tube tip is in the distal stomach directed toward the duodenum. Moderate gaseous distention of the stomach noted. IMPRESSION: As above. Electronically Signed   By: Inge Rise M.D.   On: 04/05/2021 13:15   EEG adult  Result Date: 04/01/2021 Lora Havens, MD     04/07/2021  3:47 PM Patient Name: Adolph Clutter. MRN: [id number] Epilepsy Attending: Lora Havens Referring Physician/Provider: Dr Ina Homes Date: 04/10/2021 Duration: 23.04 mins Patient history: 55yo M s/p cardiac arrest. EEG to evaluate for seizure Level of alertness: lethargic AEDs during EEG study: None Technical aspects: This EEG study was done with scalp electrodes positioned according to the 10-20 International system of electrode placement. Electrical activity was acquired at a sampling rate of '500Hz'  and reviewed with a high frequency filter of '70Hz'  and a low frequency  filter of '1Hz' . EEG data were recorded continuously and digitally stored. Description: EEG showed continuous generalized 3 to 6 Hz theta-delta slowing.  Hyperventilation and photic stimulation were not performed.   Patient was noted to have head jerking and bilateral shoulder twitching. Concomitant EEG change before, during and after the event didn't show any EEG change to  suggest seizure. ABNORMALITY - Continuous slow, generalized IMPRESSION: This study is suggestive of moderate to severe diffuse encephalopathy, nonspecific etiology. No seizures or epileptiform discharges were seen throughout the recording. Patient was noted to have head jerking and bilateral shoulder twitching without concomitant EEG change and was most likely NOT epileptic. [place]: BMET Recent [employer] 03/21/21 0125 03/22/21 0636 03/08/2021 1800 03/02/2021 2018 03/29/2021 0213 04/08/2021 0223 03/28/2021 0714 04/17/2021 0720 04/02/2021 0757 04/14/2021 1232 04/03/2021 1402 04/14/2021 1630 03/25/21 0004 03/25/21 0442  NA 134*   < > 138   < > 140   < > 142 143 144  144 142 142 140 138 138  K 3.8   < > 3.4*   < > 5.7*   < > 3.5 3.5 3.1*  3.1* 2.9* 3.2* 3.4* 3.8 3.9  CL 97*   < > 105  --  103  --  103  --  104 107  --  105 106 103  CO2 28   < > 19*  --  21*  --  18*  --   --  19*  --  '22 22 24  ' GLUCOSE 141*   < > 158*  --  252*  --  280*  --  264* 198*  --  120* 159* 141*  BUN 18   < > 17  --  20  --  22*  --  20 21*  --  '19 16 16  ' CREATININE 3.60*   < > 3.50*  --  3.56*  --  3.51*  --  3.10* 3.14*  --  2.69* 2.21* 2.16*  CALCIUM 8.6*   < > 7.4*  --  7.5*  --  8.0*  --   --  8.6*  --  8.9 8.1* 8.6*  PHOS 3.3  --   --   --   --   --   --   --   --   --   --  3.3  --  3.0   < > = values in this interval not displayed.   CBC Recent Labs  Lab 04/03/2021 1232 04/08/2021 1402 04/20/2021 1630 04/20/2021 1748 03/25/21 0004 03/25/21 0442  WBC 11.0*  --  12.8*  --  14.2* 14.0*  HGB 11.9* 11.2* 11.6*  --  10.5* 10.7*  HCT 34.0* 33.0* 32.3*  --  28.9* 30.1*  MCV 85.4  --  83.9  --  84.0 85.3  PLT 41*  40*  --  38* 37* 58*  60* 62*  59*   Medications:    . sodium chloride   Intravenous Once  . sodium chloride   Intravenous Once  . sodium chloride   Intravenous Once  . sodium chloride   Intravenous Once  . acetaminophen  1,000 mg Oral Q6H   Or  . acetaminophen (TYLENOL) oral liquid  160 mg/5 mL  1,000 mg Per Tube Q6H  . aspirin  300 mg Rectal Daily  . atorvastatin  40 mg Oral Daily  . bisacodyl  10 mg Oral Daily   Or  . bisacodyl  10 mg Rectal Daily  . chlorhexidine  gluconate (MEDLINE KIT)  15 mL Mouth Rinse BID  . Chlorhexidine Gluconate Cloth  6 each Topical Daily  . cinacalcet  30 mg Oral Q T,Th,Sa-HD  . darbepoetin (ARANESP) injection - DIALYSIS  60 mcg Intravenous Q Thu-HD  . docusate sodium  200 mg Oral Daily  . mouth rinse  15 mL Mouth Rinse 10 times per day  . pantoprazole  40 mg Oral Daily  . sodium chloride flush  10-40 mL Intracatheter Q12H  . sodium chloride flush  3 mL Intravenous Q12H   Elmarie Shiley, MD 03/25/2021, 8:19 AM

## 2021-03-25 NOTE — Progress Notes (Addendum)
Nutrition Follow Up  DOCUMENTATION CODES:   Severe malnutrition in context of chronic illness  INTERVENTION:   Start trickle tube feeding -Vital 1.5 @ 20 ml/hr via Cortrak  -Once hemodynamics approve advance slowly to goal rate 50 ml/hr (1200 ml) -[employer] TF 45 ml QID  At goal rate TF provides: 1960 kcals, 125 grams protein, 917 ml free water.   Add B complex with vitamin C to account for losses on CRRT  NUTRITION DIAGNOSIS:   Severe Malnutrition related to chronic illness (CAD/CHF/ESRD) as evidenced by severe fat depletion,severe muscle depletion.  Ongoing  GOAL:   Patient will meet greater than or equal to 90% of their needs   Progressing via TF  MONITOR:   Vent status,Skin,TF tolerance,Weight trends,Labs,I & O's  REASON FOR ASSESSMENT:   Consult Enteral/tube feeding initiation and management  ASSESSMENT:   Patient with PMH significant for DM, HTN, PAD, CHF, CAD, ESRD, cirrhosis, and IBS. Presents this admission for CABG.   5/22- s/p RHC, severe 3vd, high cardiac output 5/26- paracentesis- drained 4L 5/31- s/p CABG 6/01- code, hemorrhagic shock, back to OR for mediastinal exploration  Pt discussed during ICU rounds and with RN.   Patient extubated this am. Remains on pressors and inulin drip. Keeping even on CRRT. No BM x 2 days. Has regimen in place. Able to take full liquids but only take sips at this time. Per CCM, okay to start feeding via Cortrak. Stay at [place] until hemodynamics improve.   Patient able to report he has lost a significant amount of weight over the last past few months. Unable to provide diet history.   EDW: 78.5 kg  Current weight: 80.9 kg   UOP: 450 ml x 24 hrs  CRRT: 1593 ml x 24 hrs  Chest tube: 5260 ml x 24 hrs   Drips: epinephrine, insulin, levophed, vasopressin Medications: dulcolax, sensipar, aranesp, colace Labs: CBG 87-141  Diet Order:   Diet Order            Diet full liquid Room service appropriate? Yes; Fluid  consistency: Thin  Diet effective now                 EDUCATION NEEDS:   Not appropriate for education at this time  Skin:  Skin Assessment: Skin Integrity Issues: Skin Integrity Issues:: Incisions,DTI DTI: sacrum Incisions: R leg, chest  Last BM:  5/31  Height:   Ht Readings from Last 1 Encounters:  03/20/2021 '6\' 2"'$  (1.88 m)    Weight:   Wt Readings from Last 1 Encounters:  03/25/21 80.9 kg   BMI:  Body mass index is 22.9 kg/m.  Estimated Nutritional Needs:   Kcal:  2450-2650 kcal  Protein:  125-155 grams  Fluid:  >/= 2 L/day  Mariana Single RD, LDN Clinical Nutrition Pager listed in [place]

## 2021-03-25 NOTE — Progress Notes (Signed)
Patient ID: David Rose., male   DOB: 03/12/1966, 55 y.o.   MRN: [id number]     Advanced Heart Failure Rounding Note  PCP-Cardiologist: Carlyle Dolly, MD    Patient Profile   55 y/o male w/  history of ESRD due to DM and CHF with mid-range EF (LV EF 40-45% with moderately decreased RV systolic function and PASP 99 on 4/22 echo) as well as cirrhosis of uncertain etiology.  Based on low EF and elevated PA pressure, right and left heart cath was done in 5/22.  This showed severe 3VD, high cardiac output with moderate pulmonary hypertension but low PVR.  He is planned for CABG, but admitted pre-op for optimization.    Subjective:    CABG x 4 on 5/31 with LIMA-LAD, SVG-RCA, SVG-OM, SVG-D.   Coagulopathic post-op with multiple blood products.  Extubated post-op but developed respiratory distress => PEA arrest then VT. Reintubated.  ROSC with ACLS.  Returned to OR for mediastinal exploration 6/1.    Currently on epinephrine 2, NE 5, vasopressin 0.04, milrinone 0.375, amiodarone 30.   MAP stable. Weight up.  Rhythm uncertain, ?afib versus NSR with PACs. He is on CVVH with UF running even, O>>I with high chest tube output.    Plts down to 59K.   Propofol weaned, awake on vent. CXR with improved right effusion.   Swan numbers:  CVP 10 PA 30/17 CI 3.5 Co-ox 78%  Abdominal US 5/23 w/ nodular hepatic parenchymal pattern with increased echogenicity consistent with the patient's known cirrhosis. No focal hepatic abnormality identified. Portal vein is patent. Moderate ascites. Gallstones noted.  - LFTs normal. INR 1.3   Paracentesis 4L on 5/26.   RHC Procedural Findings: Hemodynamics (mmHg) RA mean 14 RV 61/15 PA 64/25, mean 41 PCWP mean 16 Oxygen saturations: IVC/low RA 69% PA 72% AO 99% Cardiac Output (Fick) 6.82  Cardiac Index (Fick) 3.29 PVR 3.7 WU  Cardiac Output (Thermo) 6.36  Cardiac Index (Thermo) 3.07 PVR 3.9 WU PAPI 2.8  Objective:   Weight Range: 80.9  kg Body mass index is 22.9 kg/m.   Vital Signs:   Temp:  [92.48 F (33.6 C)-97.9 F (36.6 C)] 97 F (36.1 C) (06/02 0645) Pulse Rate:  [44-116] 100 (06/02 0645) Resp:  [12-42] 22 (06/02 0645) BP: (68-175)/(24-78) 68/24 (06/02 0400) SpO2:  [87 %-100 %] 100 % (06/02 0645) Arterial Line BP: (68-200)/(37-71) 136/48 (06/02 0645) FiO2 (%):  [40 %-50 %] 40 % (06/02 0400) Weight:  [80.9 kg] 80.9 kg (06/02 0600) Last BM Date: 03/03/2021  Weight change: Filed Weights   03/22/21 0409 02/26/2021 0500 03/25/21 0600  Weight: 73.5 kg 71.4 kg 80.9 kg    Intake/Output:   Intake/Output Summary (Last 24 hours) at 03/25/2021 0739 Last data filed at 03/25/2021 0700 Gross per 24 hour  Intake 2242.43 ml  Output 7303 ml  Net -5060.57 ml      Physical Exam    General: Awake on vent Neck: JVP 8-9 cm, no thyromegaly or thyroid nodule.  Lungs: Mildly decreased at bases. CV: Nondisplaced PMI.  Heart mildly tachy, irregular S1/S2, no S3/S4, no murmur.  No peripheral edema.   Abdomen: Soft, nontender, no hepatosplenomegaly, no distention.  Skin: Intact without lesions or rashes.  Neurologic: Awake, follows commands.  Extremities: No clubbing or cyanosis.  HEENT: Normal.    Telemetry   NSR with PACs versus AF in 100s (personally reviewed)  EKG    No new EKG to review   Labs    CBC  Recent Labs    03/25/21 0004 03/25/21 0442  WBC 14.2* 14.0*  HGB 10.5* 10.7*  HCT 28.9* 30.1*  MCV 84.0 85.3  PLT 58*  60* 62*  59*   Basic Metabolic Panel Recent Labs    04/19/2021 1630 03/25/21 0004 03/25/21 0442  NA 140 138 138  K 3.4* 3.8 3.9  CL 105 106 103  CO2 '22 22 24  ' GLUCOSE 120* 159* 141*  BUN '19 16 16  ' CREATININE 2.69* 2.21* 2.16*  CALCIUM 8.9 8.1* 8.6*  MG 2.0  --  2.0  PHOS 3.3  --  3.0   Liver Function Tests Recent Labs    04/17/2021 0714 04/20/2021 1630 03/25/21 0442  AST 35  --  34  ALT 17  --  14  ALKPHOS 33*  --  41  BILITOT 2.2*  --  3.2*  PROT 3.8*  --  5.0*  ALBUMIN  2.2* 3.2* 3.1*   No results for input(s): LIPASE, AMYLASE in the last 72 hours. Cardiac Enzymes No results for input(s): CKTOTAL, CKMB, CKMBINDEX, TROPONINI in the last 72 hours.  BNP: BNP (last 3 results) Recent Labs    03/04/2021 1545  BNP 1,127.8*    ProBNP (last 3 results) No results for input(s): PROBNP in the last 8760 hours.   D-Dimer Recent Labs    03/25/21 0004 03/25/21 0442  DDIMER 2.34* 2.27*   Hemoglobin A1C No results for input(s): HGBA1C in the last 72 hours. Fasting Lipid Panel Recent Labs    03/25/21 0442  TRIG 84   Thyroid Function Tests No results for input(s): TSH, T4TOTAL, T3FREE, THYROIDAB in the last 72 hours.  Invalid input(s): FREET3  Other results:   Imaging    DG Chest 1 View  Result Date: 04/08/2021 CLINICAL DATA:  Status post chest tube placement. EXAM: CHEST  1 VIEW COMPARISON:  Single-view of the chest earlier today. FINDINGS: A new pigtail catheter projects in the right lower chest. Support tubes and lines are otherwise unchanged. Right effusion and airspace disease appear unchanged. No pneumothorax. The left lung is clear. Heart size is normal. IMPRESSION: New pigtail catheter projects in the lower right chest. No change in a right effusion and airspace disease. No pneumothorax or other new abnormality. Electronically Signed   By: Inge Rise M.D.   On: 04/06/2021 10:22   DG CHEST PORT 1 VIEW  Result Date: 03/25/2021 CLINICAL DATA:  Chest tube in place.  History of open heart surgery. EXAM: PORTABLE CHEST 1 VIEW COMPARISON:  March 24, 2021. FINDINGS: Endotracheal tube tip projects approximately 4.5 cm above the carina. Right IJ approach Swan-Ganz catheter, bilateral chest tubes, and mediastinal drain are in similar position. Enteric tube courses below the diaphragm in outside the field of view. Improved aeration of the right lung base with streaky left basilar opacities. Improved right pleural effusion. No visible pneumothorax on this  single AP semi erect radiograph. IMPRESSION: 1. Improved right pleural effusion and right basilar opacities. Mild streaky left basilar opacities, most likely atelectasis in the postoperative setting. 2. Support devices, as detailed above. Electronically Signed   By: Margaretha Sheffield MD   On: 03/25/2021 06:55   DG Abd Portable 1V  Result Date: 04/19/2021 CLINICAL DATA:  Status post feeding tube placement. EXAM: PORTABLE ABDOMEN - 1 VIEW COMPARISON:  None. FINDINGS: Feeding tube tip is in the distal stomach directed toward the duodenum. Moderate gaseous distention of the stomach noted. IMPRESSION: As above. Electronically Signed   By: Inge Rise M.D.  On: 03/28/2021 13:15   EEG adult  Result Date: 03/29/2021 Lora Havens, MD     04/18/2021  3:47 PM Patient Name: David Rose. MRN: [id number] Epilepsy Attending: Lora Havens Referring Physician/Provider: Dr Ina Homes Date: 04/20/2021 Duration: 23.04 mins Patient history: 55yo M s/p cardiac arrest. EEG to evaluate for seizure Level of alertness: lethargic AEDs during EEG study: None Technical aspects: This EEG study was done with scalp electrodes positioned according to the 10-20 International system of electrode placement. Electrical activity was acquired at a sampling rate of '500Hz'  and reviewed with a high frequency filter of '70Hz'  and a low frequency filter of '1Hz' . EEG data were recorded continuously and digitally stored. Description: EEG showed continuous generalized 3 to 6 Hz theta-delta slowing.  Hyperventilation and photic stimulation were not performed.   Patient was noted to have head jerking and bilateral shoulder twitching. Concomitant EEG change before, during and after the event didn't show any EEG change to suggest seizure. ABNORMALITY - Continuous slow, generalized IMPRESSION: This study is suggestive of moderate to severe diffuse encephalopathy, nonspecific etiology. No seizures or epileptiform discharges were seen throughout  the recording. Patient was noted to have head jerking and bilateral shoulder twitching without concomitant EEG change and was most likely NOT epileptic. Priyanka Barbra Sarks     Medications:     Scheduled Medications: . sodium chloride   Intravenous Once  . sodium chloride   Intravenous Once  . sodium chloride   Intravenous Once  . sodium chloride   Intravenous Once  . acetaminophen  1,000 mg Oral Q6H   Or  . acetaminophen (TYLENOL) oral liquid 160 mg/5 mL  1,000 mg Per Tube Q6H  . aspirin  300 mg Rectal Daily  . atorvastatin  40 mg Oral Daily  . bisacodyl  10 mg Oral Daily   Or  . bisacodyl  10 mg Rectal Daily  . chlorhexidine gluconate (MEDLINE KIT)  15 mL Mouth Rinse BID  . Chlorhexidine Gluconate Cloth  6 each Topical Daily  . cinacalcet  30 mg Oral Q T,Th,Sa-HD  . darbepoetin (ARANESP) injection - DIALYSIS  60 mcg Intravenous Q Thu-HD  . docusate sodium  200 mg Oral Daily  . mouth rinse  15 mL Mouth Rinse 10 times per day  . pantoprazole  40 mg Oral Daily  . sodium chloride flush  10-40 mL Intracatheter Q12H  . sodium chloride flush  3 mL Intravenous Q12H    Infusions: .  prismasol BGK 4/2.5 400 mL/hr at 03/31/2021 2337  .  prismasol BGK 4/2.5 200 mL/hr at 04/12/2021 1055  . sodium chloride Stopped (04/02/2021 0413)  . sodium chloride    . sodium chloride 10 mL/hr at 03/25/21 0213  . amiodarone 30 mg/hr (03/25/21 0700)  . dexmedetomidine (PRECEDEX) IV infusion 0.2 mcg/kg/hr (04/05/2021 1630)  . epinephrine 2 mcg/min (03/25/21 0700)  . insulin 1.2 mL/hr at 03/25/21 0700  . lactated ringers    . lactated ringers    . lactated ringers 20 mL/hr at 03/25/21 0700  . milrinone 0.375 mcg/kg/min (03/25/21 0700)  . nitroGLYCERIN Stopped (02/25/2021 1904)  . norepinephrine (LEVOPHED) Adult infusion 5 mcg/min (03/25/21 0700)  . phenylephrine (NEO-SYNEPHRINE) Adult infusion Stopped (04/13/2021 0504)  . prismasol BGK 4/2.5 1,500 mL/hr at 03/25/21 0702  . propofol (DIPRIVAN) infusion 40  mcg/kg/min (03/25/21 0700)  . vasopressin 0.04 Units/min (03/25/21 0700)    PRN Medications: sodium chloride, dextrose, heparin, lactated ringers, lidocaine (PF), metoprolol tartrate, midazolam, morphine injection, ondansetron (ZOFRAN) IV,  oxyCODONE, sodium chloride, sodium chloride flush, sodium chloride flush, traMADol     Assessment/Plan   1. CAD: Severe 3VD with decreased EF.  I reviewed films with Dr. Ellyn Hack, PCI would be possible but would be difficult/high risk with multiple lesions and heavy calcification. 5/31 LIMA-LAD, SVG-RCA, SVG-OM, SVG-D. - Continue ASA 81.  - Continue atorvastatin 40 mg daily.  2. Acute/chronic HF with mid range EF => cardiogenic shock post-CABG: Suspect ischemic cardiomyopathy.  Echo 4/22 with EF 40-45% with moderately decreased RV systolic function and PASP 99, moderate TR. There was a prominent component of RV failure with RA pressure elevated out of proportion to PCWP (CVP/PCWP 0.875 on RHC) but PAPI adequate at 2.8. Post-op had PEA arrest then VT, currently on epinephrine 2, NE 5, vasopressin 0.04, milrinone 0.375 with stable MAP.  PA pressure 30/17 with adequate CI at 3.5 and co-ox 78%. CVVH for volume removal, CVP 10 today. I/Os negative with high CT output.  - Decrease milrinone to 0.25.  - Wean pressors, start with vasopressin.   - CVVH even for now with high CT output.  3. ESRD: Suspected due to diabetes.  Volume up post-op with multiple blood products. - CVVH even currently with high CT output.   4. 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  The CO was not markedly high.  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 Oxygen saturation was 99% on RA pre-op, so no evidence for hepatopulmonary syndrome.  PA pressure only mildly elevated currently at 30/17 post-op.  - Decrease milrinone to 0.25 today.   - Transition back to sildenafil  eventually.    5. Cirrhosis: Noted on abdominal US from 2020.  H/o paracentesis.  Had workup at [facility] (though patient does not remember this), viral hepatitis labs negative.  They ended up think that the cirrhosis was cardiogenic.  Repeat abdominal US 5/23 w/ nodular hepatic parenchymal pattern with increased echogenicity consistent with the patient's known cirrhosis. No focal hepatic abnormality identified. Portal vein is patent. Moderate ascites. GI has seen, patient had 4L paracentesis on 5/26. 6. Coagulopathy: Post-op bleeding in setting of coagulopathy post-CABG in patient with cirrhosis.  Currently seems controlled.  7. VT: Had after PEA arrest am 6/1, now on amiodarone gtt 30.  8. Anemia: ABLA.  Stable today hgb 10.7.  9. Pleural effusions: Right effusion with chest tube, ?hepatic hydrothorax.  High CT drainage.    10. PEA arrest: Respiratory arrest post-extubation.  ROSC with CPR.  11. Acute hypoxemic respiratory failure: Intubated, awake.   - ?extubate today 12. Rhythm: Turned off pacing today, underlying rhythm ?AF versus NSR with PACs.  - ECG today.     CRITICAL CARE Performed by: Loralie Champagne  Total critical care time: 40 minutes  Critical care time was exclusive of separately billable procedures and treating other patients.  Critical care was necessary to treat or prevent imminent or life-threatening deterioration.  Critical care was time spent personally by me on the following activities: development of treatment plan with patient and/or surrogate as well as nursing, discussions with consultants, evaluation of patient's response to treatment, examination of patient, obtaining history from patient or surrogate, ordering and performing treatments and interventions, ordering and review of laboratory studies, ordering and review of radiographic studies, pulse oximetry and re-evaluation of patient's condition.   Length of Stay: Perkins, MD  03/25/2021, 7:39 AM  Advanced  Heart Failure Team Pager [phone] (M-F; 7a -  5p)  Please contact [facility] for night-coverage after hours (5p -7a ) and weekends on [email or web address]

## 2021-03-25 NOTE — Consult Note (Signed)
NAME:  David Wieting., MRN:  [id number], DOB:  Feb 18, 1966, LOS: 74 ADMISSION DATE:  03/20/2021, CONSULTATION DATE:  04/16/2021 REFERRING MD:  Verl Blalock, CHIEF COMPLAINT:  code   History of Present Illness:  55 year old man w/ hx of DM2, HTN, PAD, ESRD, cirrhosis (? Cardiac) who presented for symptomatic ischemic cardiomyopathy.  Underwent pre-op optimization with CHF, GI, and nephrology teams then high risk CABG 5/31. On  5/31-6/1 Mr. Anacker unfortunately developed respiratory distress followed by PEA and chest compressions. This caused increased bleeding around surgical sites, underwent mediastinal exploration this AM with some bleeding around mammary graft site and diffuse oozing.  Received multiple units of blood products overnight.  He is intubated, on pressors.  PCCM asked to assist with management.  He remains critically ill in [facility]  Pertinent  Medical History  ESRD on HD Cardiac cirrhosis with portal hypertension IBS IDDM  Significant Hospital Events: Including procedures, antibiotic start and stop dates in addition to other pertinent events   . 5/23 admitted . 5/26 paracentesis 4L . 5/30 HD . 5/31 CABG . 5/31-6/1 code, OR for mediastinal exploration 2/2 hemorrhagic shock induced by CPR. Factor 7, protamine, FFP, cryo given . 6/1 PCCM consult, 4u prbc and 1 plt    Interim History / Subjective:  Tmax 97.7  4102m UOP, -3739 past 24 hours, -4.8L admit  CT R- 3240 past 24, 4873movernight CT1,2,3- 202075mut past 24, 350m3mernight  Upon exam- Levo 5, amio 30, vaso 0.04, insulin 1.2, milrinone 0.375, epi 2  Unable to obtain subjective evaluation due to patient status  Objective   Blood pressure (!) 68/24, pulse 100, temperature (!) 97 F (36.1 C), resp. rate (!) 22, height '6\' 2"'$  (1.88 m), weight 80.9 kg, SpO2 100 %. PAP: (24-54)/(13-27) 33/15 CVP:  [0 mmHg-13 mmHg] 6 mmHg CO:  [4.4 L/min-7.4 L/min] 7.4 L/min CI:  [2.2 L/min/m2-3.8 L/min/m2] 3.8 L/min/m2  Vent Mode:  PRVC FiO2 (%):  [40 %-50 %] 40 % Set Rate:  [24 bmp] 24 bmp Vt Set:  [460 mL-650 mL] 650 mL PEEP:  [5 cmH20] 5 cmH20 Plateau Pressure:  [16 cmH20-21 cmH20] 16 cmH20   Intake/Output Summary (Last 24 hours) at 03/25/2021 0732 Last data filed at 03/25/2021 0600 Gross per 24 hour  Intake 2156.72 ml  Output 7303 ml  Net -5146.28 ml   Filed Weights   03/22/21 0409 03/20/2021 0500 03/25/21 0600  Weight: 73.5 kg 71.4 kg 80.9 kg    Examination: General: ill appearing, in bed, sedated  HEENT: MM pink/moist, anicteric, trachea midline, ETT, cortrak  Neuro: GCS 11t, RASS 0, L eye 5, rt eye 4, reactive to light CV: S1S2, paced rhythm on monitor, no m/r/g appreciated PULM:  clear in the upper lobes, clear in the lower lobes, scant secretions, chest expansion symmetric. CT tidaling, -20 suction, no air leak GI: soft, bsx4 active, non distened   Extremities: warm/dry, no pretibial edema, capillary refill less than 3 seconds  Skin: CT surgery chest site, dressing intact, no other appreciated rashes or lesions    Labs/imaging that I havepersonally reviewed  (right click and "Reselect all SmartList Selections" daily)  CBC- WBC downtrending, ABLA, thrombocytopenia BMP- Creat downtrending DIC panel CXR- effusion improving  Resolved Hospital Problem list     Assessment & Plan:   Acute hypoxemic respiratory failure S/P cardiac surgery and hemorraghic shock. RT chest tube placed on 6/1 for effusion. 6/2 CXR demonstrates improvement in effusion. CT 480 out in past 12 hours. -  SAT/SBT today. Will hopefully wean to extubate. -PAD bundle with propofol and fentanyl pushes. Goal rass 0 to -1. Wean to goal -LTVV strategy with tidal volumes of 4-8 cc/kg ideal body weight -Goal plateau pressures of 30 and driving pressures of 15 -Wean PEEP/FiO2 for SpO2 greater than 92 -Follow intermittent CXR and ABG PRN -Continue RT chest tube to -20 suction.  Acute Encephalopathy ?post shock/CPR vs sedation. EEG  showed no seizure activity. Following commands -PAD bundle as discussed. -Continue close neurological monioring  CPR induced mediastinal hemorrhage post washout 6/1; ongoing oozing related to coagulopathy Cirrhotic and blood-loss induced coagulopathy Hemorrhagic Shock Acute Blood Loss Anemia Thrombocytopenia- in setting of blood loss, coagulopathy, and cirrhosis AM fibrinogen 290, INR 1.4, no schistocytes, HGB 10.7, 1U plt given overnight PlT 59>62, 4UPRBC in past 24 hours. INR 1.4 -Transfuse PRBC if HBG less than 7 -Obtain AM CBC to trend H&H -monitor for further signs of blood loss -Ensure normothermia  In hospital cardiac arrest precipitated by respiratory distress- PEA, minimal downtime. CAD post CABG Cardiac Cirrhosis with ascites Hx HTN VT- On 6/1 COOX 78 this AM. Remains on pressors, CVP 10 -Management per CTCS and Heart Failure -Continue ASA 81, atorvastatin, and heparin gtt.  -Continue Amio GTT -On Vaso, Levo, Epi, Milrinone. Goal MAP greater than 65, titrate pressors to goal  ESRD on HD PTA -Appreciate Neph assistance, goal 0 to -50 hour -Continue CRRT per Neph orders.  DM2 BG 120-280 -Blood Glucose goal 140-180. -Continue Insulin Gtt  Best practice (right click and "Reselect all SmartList Selections" daily)  Diet:  NPO Pain/Anxiety/Delirium protocol (if indicated): Yes (RASS goal 0) VAP protocol (if indicated): Yes DVT prophylaxis: Contraindicated SCD GI prophylaxis: PPI Glucose control:  Insulin gtt Central venous access:  Yes, and it is still needed Arterial line:  Yes, and it is still needed Foley:  Yes, and it is still needed Mobility:  bed rest  PT consulted: Yes Last date of multidisciplinary goals of care discussion [per primary] Code Status:  full code Disposition: per primary   Critical care time: 32 minutes      Redmond School., MSN, APRN, AGACNP-BC [facility]  03/25/2021 , 7:33 AM  Please see [email or web address]  for pager details  If no response, please call [phone] After hours, please call Elink at [phone]

## 2021-03-25 NOTE — Procedures (Signed)
Extubation Procedure Note  Patient Details:   Name: David Rose. DOB: [birth year] MRN: [id number]   Airway Documentation:    Vent end date: 03/25/21 Vent end time: 0828   Evaluation  O2 sats: stable throughout Complications: No apparent complications Patient did tolerate procedure well. Bilateral Breath Sounds: Clear,Diminished   Yes   Per MD order, RT extubated pt to [facility]. Prior to extubation, pt did have a positive cuff leak. Pt tolerated well. No stridor noted. Pt was able to state his name and DOB. Pt's VSS. RT will continue to monitor pt.  Jorje Guild 03/25/2021, 8:34 AM

## 2021-03-25 NOTE — Progress Notes (Signed)
03/25/2021   I have seen and evaluated the patient for postoperative respiratory failure and shock.  S:  Has settled out.  Some questionable seizure activity yesterday, EEG neg and he eventually woke up, followed commands, question anesthesia effect.  Bps very labile question sedation related.  O: Blood pressure (!) 68/24, pulse 100, temperature (!) 97 F (36.1 C), resp. rate (!) 22, height '6\' 2"'$  (1.88 m), weight 80.9 kg, SpO2 100 %.  Chronically ill appearing man on vent Pupils L>R, R sluggish L nonreactive suspect baseline Lungs clear, R chest tube serosanguinous output, R chest tube serous output Follows commands  Coox 78% Chemistries look good on CRRT CXR clear Net neg 3.8L yesterday  A:  Postoperative mixed cardiogenic and hemorrhagic shock improved S/P high risk CABG Cardiac cirrhosis Postoperative respiratory failure Question hepatic hydrothorax ESRD on CRRT  P:  - SAT/SBT, consider extubation - Net neutral with CRRT for now - Space out lab draws - Mediastinal drains and sternal wound care per TCTS - Hopefully Bps settle out with extubation  Patient critically ill due to respiratory failure, shock Interventions to address this today ventilator weaning Risk of deterioration without these interventions is high  I personally spent 34 minutes providing critical care not including any separately billable procedures  Erskine Emery MD [facility] Prefer epic messenger for cross cover needs If after hours, please call E-link

## 2021-03-26 ENCOUNTER — Other Ambulatory Visit: Payer: Self-pay

## 2021-03-26 ENCOUNTER — Encounter (HOSPITAL_COMMUNITY): Payer: Self-pay | Admitting: Cardiothoracic Surgery

## 2021-03-26 DIAGNOSIS — J810 Acute pulmonary edema: Secondary | ICD-10-CM | POA: Diagnosis not present

## 2021-03-26 DIAGNOSIS — D696 Thrombocytopenia, unspecified: Secondary | ICD-10-CM

## 2021-03-26 DIAGNOSIS — J9601 Acute respiratory failure with hypoxia: Secondary | ICD-10-CM

## 2021-03-26 DIAGNOSIS — D649 Anemia, unspecified: Secondary | ICD-10-CM

## 2021-03-26 LAB — BPAM RBC
Blood Product Expiration Date: 202206182359
Blood Product Expiration Date: 202206182359
Blood Product Expiration Date: 202206182359
Blood Product Expiration Date: 202206192359
Blood Product Expiration Date: 202206192359
Blood Product Expiration Date: 202206192359
Blood Product Expiration Date: 202206192359
Blood Product Expiration Date: 202206192359
Blood Product Expiration Date: 202206192359
Blood Product Expiration Date: 202206202359
Blood Product Expiration Date: 202206202359
Blood Product Expiration Date: 202206202359
Blood Product Expiration Date: 202206222359
Blood Product Expiration Date: 202206222359
Blood Product Expiration Date: 202206232359
Blood Product Expiration Date: 202206232359
Blood Product Expiration Date: 202206232359
Blood Product Expiration Date: 202206232359
Blood Product Expiration Date: 202206232359
Blood Product Expiration Date: 202206232359
ISSUE DATE / TIME: 202205311721
ISSUE DATE / TIME: 202205311721
ISSUE DATE / TIME: 202205311948
ISSUE DATE / TIME: 202205311948
ISSUE DATE / TIME: 202206010217
ISSUE DATE / TIME: 202206010217
ISSUE DATE / TIME: 202206010401
ISSUE DATE / TIME: 202206010401
ISSUE DATE / TIME: 202206010459
ISSUE DATE / TIME: 202206010459
ISSUE DATE / TIME: 202206010459
ISSUE DATE / TIME: 202206010459
ISSUE DATE / TIME: 202206010709
ISSUE DATE / TIME: 202206010709
ISSUE DATE / TIME: 202206010709
ISSUE DATE / TIME: 202206010709
Unit Type and Rh: 6200
Unit Type and Rh: 6200
Unit Type and Rh: 6200
Unit Type and Rh: 6200
Unit Type and Rh: 6200
Unit Type and Rh: 6200
Unit Type and Rh: 6200
Unit Type and Rh: 6200
Unit Type and Rh: 6200
Unit Type and Rh: 6200
Unit Type and Rh: 6200
Unit Type and Rh: 6200
Unit Type and Rh: 6200
Unit Type and Rh: 6200
Unit Type and Rh: 6200
Unit Type and Rh: 6200
Unit Type and Rh: 6200
Unit Type and Rh: 6200
Unit Type and Rh: 6200
Unit Type and Rh: 6200

## 2021-03-26 LAB — DIC (DISSEMINATED INTRAVASCULAR COAGULATION)PANEL
D-Dimer, Quant: 1.79 ug/mL-FEU — ABNORMAL HIGH (ref 0.00–0.50)
D-Dimer, Quant: 1.87 ug/mL-FEU — ABNORMAL HIGH (ref 0.00–0.50)
D-Dimer, Quant: 2.44 ug/mL-FEU — ABNORMAL HIGH (ref 0.00–0.50)
D-Dimer, Quant: 2.71 ug/mL-FEU — ABNORMAL HIGH (ref 0.00–0.50)
Fibrinogen: 339 mg/dL (ref 210–475)
Fibrinogen: 362 mg/dL (ref 210–475)
Fibrinogen: 414 mg/dL (ref 210–475)
Fibrinogen: 429 mg/dL (ref 210–475)
INR: 1.6 — ABNORMAL HIGH (ref 0.8–1.2)
INR: 1.6 — ABNORMAL HIGH (ref 0.8–1.2)
INR: 1.6 — ABNORMAL HIGH (ref 0.8–1.2)
INR: 1.5 — ABNORMAL HIGH (ref 0.8–1.2)
Platelets: 46 10*3/uL — ABNORMAL LOW (ref 150–400)
Platelets: 42 10*3/uL — ABNORMAL LOW (ref 150–400)
Platelets: UNDETERMINED 10*3/uL (ref 150–400)
Platelets: 53 10*3/uL — ABNORMAL LOW (ref 150–400)
Prothrombin Time: 18.8 seconds — ABNORMAL HIGH (ref 11.4–15.2)
Prothrombin Time: 18.6 seconds — ABNORMAL HIGH (ref 11.4–15.2)
Prothrombin Time: 18.8 seconds — ABNORMAL HIGH (ref 11.4–15.2)
Prothrombin Time: 18.3 seconds — ABNORMAL HIGH (ref 11.4–15.2)
Smear Review: NONE SEEN
Smear Review: NONE SEEN
Smear Review: NONE SEEN
Smear Review: NONE SEEN
aPTT: 40 seconds — ABNORMAL HIGH (ref 24–36)
aPTT: 41 seconds — ABNORMAL HIGH (ref 24–36)
aPTT: 44 seconds — ABNORMAL HIGH (ref 24–36)
aPTT: 45 seconds — ABNORMAL HIGH (ref 24–36)

## 2021-03-26 LAB — COMPREHENSIVE METABOLIC PANEL
ALT: 8 U/L (ref 0–44)
AST: 22 U/L (ref 15–41)
Albumin: 3.2 g/dL — ABNORMAL LOW (ref 3.5–5.0)
Alkaline Phosphatase: 56 U/L (ref 38–126)
Anion gap: 7 (ref 5–15)
BUN: 11 mg/dL (ref 6–20)
CO2: 26 mmol/L (ref 22–32)
Calcium: 8 mg/dL — ABNORMAL LOW (ref 8.9–10.3)
Chloride: 101 mmol/L (ref 98–111)
Creatinine, Ser: 1.74 mg/dL — ABNORMAL HIGH (ref 0.61–1.24)
GFR, Estimated: 46 mL/min — ABNORMAL LOW (ref >60)
Glucose, Bld: 162 mg/dL — ABNORMAL HIGH (ref 70–99)
Potassium: 4.7 mmol/L (ref 3.5–5.1)
Sodium: 134 mmol/L — ABNORMAL LOW (ref 135–145)
Total Bilirubin: 4 mg/dL — ABNORMAL HIGH (ref 0.3–1.2)
Total Protein: 5.3 g/dL — ABNORMAL LOW (ref 6.5–8.1)

## 2021-03-26 LAB — TYPE AND SCREEN
ABO/RH(D): A POS
Antibody Screen: NEGATIVE
Unit division: 0
Unit division: 0
Unit division: 0
Unit division: 0
Unit division: 0
Unit division: 0
Unit division: 0
Unit division: 0
Unit division: 0
Unit division: 0
Unit division: 0
Unit division: 0
Unit division: 0
Unit division: 0
Unit division: 0
Unit division: 0
Unit division: 0
Unit division: 0
Unit division: 0
Unit division: 0

## 2021-03-26 LAB — RENAL FUNCTION PANEL
Albumin: 3.1 g/dL — ABNORMAL LOW (ref 3.5–5.0)
Anion gap: 6 (ref 5–15)
BUN: 11 mg/dL (ref 6–20)
CO2: 28 mmol/L (ref 22–32)
Calcium: 8.1 mg/dL — ABNORMAL LOW (ref 8.9–10.3)
Chloride: 103 mmol/L (ref 98–111)
Creatinine, Ser: 1.65 mg/dL — ABNORMAL HIGH (ref 0.61–1.24)
GFR, Estimated: 49 mL/min — ABNORMAL LOW (ref >60)
Glucose, Bld: 100 mg/dL — ABNORMAL HIGH (ref 70–99)
Phosphorus: 3.2 mg/dL (ref 2.5–4.6)
Potassium: 4.6 mmol/L (ref 3.5–5.1)
Sodium: 137 mmol/L (ref 135–145)

## 2021-03-26 LAB — CBC
HCT: 29 % — ABNORMAL LOW (ref 39.0–52.0)
HCT: 30.3 % — ABNORMAL LOW (ref 39.0–52.0)
Hemoglobin: 9.8 g/dL — ABNORMAL LOW (ref 13.0–17.0)
Hemoglobin: 9.7 g/dL — ABNORMAL LOW (ref 13.0–17.0)
MCH: 30.9 pg (ref 26.0–34.0)
MCH: 30.1 pg (ref 26.0–34.0)
MCHC: 33.8 g/dL (ref 30.0–36.0)
MCHC: 32 g/dL (ref 30.0–36.0)
MCV: 91.5 fL (ref 80.0–100.0)
MCV: 94.1 fL (ref 80.0–100.0)
Platelets: 47 10*3/uL — ABNORMAL LOW (ref 150–400)
Platelets: 50 10*3/uL — ABNORMAL LOW (ref 150–400)
RBC: 3.17 MIL/uL — ABNORMAL LOW (ref 4.22–5.81)
RBC: 3.22 MIL/uL — ABNORMAL LOW (ref 4.22–5.81)
RDW: 18.6 % — ABNORMAL HIGH (ref 11.5–15.5)
RDW: 19.2 % — ABNORMAL HIGH (ref 11.5–15.5)
WBC: 17.5 10*3/uL — ABNORMAL HIGH (ref 4.0–10.5)
WBC: 18.9 10*3/uL — ABNORMAL HIGH (ref 4.0–10.5)
nRBC: 0 % (ref 0.0–0.2)
nRBC: 0 % (ref 0.0–0.2)

## 2021-03-26 LAB — GLUCOSE, CAPILLARY
Glucose-Capillary: 109 mg/dL — ABNORMAL HIGH (ref 70–99)
Glucose-Capillary: 118 mg/dL — ABNORMAL HIGH (ref 70–99)
Glucose-Capillary: 142 mg/dL — ABNORMAL HIGH (ref 70–99)
Glucose-Capillary: 164 mg/dL — ABNORMAL HIGH (ref 70–99)
Glucose-Capillary: 94 mg/dL (ref 70–99)
Glucose-Capillary: 99 mg/dL (ref 70–99)

## 2021-03-26 LAB — MAGNESIUM: Magnesium: 2.2 mg/dL (ref 1.7–2.4)

## 2021-03-26 LAB — COOXEMETRY PANEL
Carboxyhemoglobin: 1.3 % (ref 0.5–1.5)
Methemoglobin: 1.2 % (ref 0.0–1.5)
O2 Saturation: 76 %
Total hemoglobin: 11 g/dL — ABNORMAL LOW (ref 12.0–16.0)

## 2021-03-26 LAB — PHOSPHORUS: Phosphorus: 3.5 mg/dL (ref 2.5–4.6)

## 2021-03-26 LAB — PROTIME-INR
INR: 1.5 — ABNORMAL HIGH (ref 0.8–1.2)
Prothrombin Time: 18.5 seconds — ABNORMAL HIGH (ref 11.4–15.2)

## 2021-03-26 LAB — FIBRINOGEN: Fibrinogen: 387 mg/dL (ref 210–475)

## 2021-03-26 MED ORDER — VITAL 1.5 CAL PO LIQD
1000.0000 mL | ORAL | Status: DC
  Administered 2021-03-26 – 2021-03-27 (×2): 1000 mL

## 2021-03-26 MED ORDER — PROSOURCE TF PO LIQD
45.0000 mL | Freq: Four times a day (QID) | ORAL | Status: DC
  Administered 2021-03-26 – 2021-03-28 (×8): 45 mL
  Filled 2021-03-26 (×8): qty 45

## 2021-03-26 MED ORDER — DARBEPOETIN ALFA 60 MCG/0.3ML IJ SOSY
60.0000 ug | PREFILLED_SYRINGE | INTRAMUSCULAR | Status: DC
Start: 2021-04-01 — End: 2021-04-14
  Administered 2021-04-08: 60 ug via SUBCUTANEOUS
  Filled 2021-03-26 (×2): qty 0.3

## 2021-03-26 NOTE — Progress Notes (Signed)
SLP Cancellation Note  Patient Details Name: David Rose. MRN: [id number] DOB: May 17, 1966   Cancelled treatment:       Reason Eval/Treat Not Completed: Other (comment) Order received for swallowing evaluation but per RN, pt has been consuming his soft diet/thin liquids and pills with no overt signs of difficulty and does not believe that eval is still needed. He also shares that his voice is strong and clear post-extubation. Will defer our eval for now, but please reorder with any acute changes.     Osie Bond., M.A. [facility] Pager [phone] Office [phone]  03/26/2021, 12:14 PM

## 2021-03-26 NOTE — Progress Notes (Signed)
2 Days Post-Op Procedure(s) (LRB): EXPLORATION POST OPERATIVE OPEN HEART (N/A) Subjective: No complaints  Objective: Vital signs in last 24 hours: Temp:  [95.72 F (35.4 C)-98.6 F (37 C)] 97.52 F (36.4 C) (06/03 0800) Pulse Rate:  [63-129] 107 (06/03 0800) Cardiac Rhythm: Atrial fibrillation (06/03 0800) Resp:  [10-55] 32 (06/03 0800) BP: (123-134)/(46-49) 123/46 (06/03 0400) SpO2:  [95 %-100 %] 98 % (06/03 0800) Arterial Line BP: (105-175)/(41-61) 125/61 (06/03 0800) Weight:  [80.9 kg] 80.9 kg (06/03 0500)  Hemodynamic parameters for last 24 hours: PAP: (47-67)/(16-34) 66/34 CVP:  [9 mmHg-26 mmHg] 26 mmHg CO:  [10.7 L/min] 10.7 L/min CI:  [5.5 L/min/m2] 5.5 L/min/m2  Intake/Output from previous day: 06/02 0701 - 06/03 0700 In: 2486.4 [P.O.:270; I.V.:1614; NG/GT:424.7; IV Piggyback:177.7] Out: 1773 [Urine:37; Chest Tube:1180] Intake/Output this shift: Total I/O In: 80.8 [I.V.:60.8; NG/GT:20] Out: 90 [Other:40; Chest Tube:50]  General appearance: alert and cooperative Neurologic: intact Heart: mildly tachy Lungs: clear to auscultation bilaterally Abdomen: mildly distended Extremities: extremities normal, atraumatic, no cyanosis or edema Wound: c/d/i  Lab Results: Recent Labs    03/25/21 1523 03/25/21 1827 03/26/21 0005 03/26/21 0442  WBC 15.8*  --   --  17.5*  HGB 9.7*  --   --  9.8*  HCT 28.5*  --   --  29.0*  PLT 45*   < > 46* 42*  47*   < > = values in this interval not displayed.   BMET:  Recent Labs    03/25/21 1523 03/26/21 0442  NA 139 134*  K 4.7 4.7  CL 103 101  CO2 27 26  GLUCOSE 115* 162*  BUN 13 11  CREATININE 1.95* 1.74*  CALCIUM 8.3* 8.0*    PT/INR:  Recent Labs    03/26/21 0442  LABPROT 18.6*  18.5*  INR 1.6*  1.5*   ABG    Component Value Date/Time   PHART 7.453 (H) 04/04/2021 1402   HCO3 19.4 (L) 03/25/2021 1402   TCO2 20 (L) 03/29/2021 1402   ACIDBASEDEF 4.0 (H) 04/14/2021 1402   O2SAT 76.0 03/26/2021 0442    CBG (last 3)  Recent Labs    03/26/21 0006 03/26/21 0444 03/26/21 0751  GLUCAP 142* 164* 118*    Assessment/Plan: S/P Procedure(s) (LRB): EXPLORATION POST OPERATIVE OPEN HEART (N/A) Mobilize CVVHD per nephrology  D/c vasopressin I would suggest rewiring Rt IJ for dialysis catheter so he can be oob Drip wean F/u HIT assay   LOS: 11 days    Wonda Olds 03/26/2021

## 2021-03-26 NOTE — Progress Notes (Signed)
Nutrition Follow-up  DOCUMENTATION CODES:   Severe malnutrition in context of chronic illness  INTERVENTION:   Tube Feeding via Cortrak:  -Vital 1.5 @ 50 ml/hr via Cortrak  -ProSource TF 45 ml QID  At goal rate TF provides: 1960 kcals, 125 grams protein, 917 ml free water.   Continue B-complex with C  Encourage oral intake; if pt tolerates goal rate, consider transitioning to nocturnal TF  NUTRITION DIAGNOSIS:   Severe Malnutrition related to chronic illness (CAD/CHF/ESRD) as evidenced by severe fat depletion,severe muscle depletion.  Being addressed via TF   GOAL:   Patient will meet greater than or equal to 90% of their needs  Progressing  MONITOR:   Vent status,Skin,TF tolerance,Weight trends,Labs,I & O's  REASON FOR ASSESSMENT:   Consult Enteral/tube feeding initiation and management  ASSESSMENT:   Patient with PMH significant for DM, HTN, PAD, CHF, CAD, ESRD, cirrhosis, and IBS. Presents this admission for CABG.  No recorded po intake post extubation. Spoke with RN who indicates pt did not really eat breakfast but did better with some lunch.  Pt remains on CRRT, pressor requirements stable, vasopresin d/c, remains on levophed and epinephrine.   Tolerating Vital 1.5 at 20 ml/hr  Labs: reviewed  Meds: B-complex with C, sensipar, aranesp, ss novolog   Diet Order:   Diet Order            DIET SOFT Room service appropriate? Yes; Fluid consistency: Thin  Diet effective now                 EDUCATION NEEDS:   Not appropriate for education at this time  Skin:  Skin Assessment: Skin Integrity Issues: Skin Integrity Issues:: Incisions,DTI DTI: sacrum Incisions: R leg, chest  Last BM:  5/31  Height:   Ht Readings from Last 1 Encounters:  03/09/2021 '6\' 2"'$  (1.88 m)    Weight:   Wt Readings from Last 1 Encounters:  03/26/21 80.9 kg     BMI:  Body mass index is 22.9 kg/m.  Estimated Nutritional Needs:   Kcal:  2450-2650 kcal  Protein:   125-155 grams  Fluid:  >/= 2 L/day   Kerman Passey MS, RDN, LDN, CNSC Registered Dietitian III Clinical Nutrition RD Pager and On-Call Pager Number Located in [place]

## 2021-03-26 NOTE — Progress Notes (Addendum)
NAME:  David Rega., MRN:  [id number], DOB:  10-31-1965, LOS: 35 ADMISSION DATE:  03/11/2021, CONSULTATION DATE:  04/20/2021 REFERRING MD:  Verl Blalock, CHIEF COMPLAINT:  code   History of Present Illness:  55 year old man w/ hx of DM2, HTN, PAD, ESRD, cirrhosis (? Cardiac) who presented for symptomatic ischemic cardiomyopathy.  Underwent pre-op optimization with CHF, GI, and nephrology teams then high risk CABG 5/31. On  5/31-6/1 Mr. Leitzel unfortunately developed respiratory distress followed by PEA and chest compressions. This caused increased bleeding around surgical sites, underwent mediastinal exploration this AM with some bleeding around mammary graft site and diffuse oozing.  Received multiple units of blood products overnight.  He is intubated, on pressors.  PCCM asked to assist with management.  He remains critically ill in the [facility] ICU  Pertinent  Medical History  ESRD on HD Cardiac cirrhosis with portal hypertension IBS IDDM  Significant Hospital Events: Including procedures, antibiotic start and stop dates in addition to other pertinent events   . 5/23 admitted . 5/26 paracentesis 4L . 5/30 HD . 5/31 CABG . 5/31-6/1 code, OR for mediastinal exploration 2/2 hemorrhagic shock induced by CPR. Factor 7, protamine, FFP, cryo given . 6/1 PCCM consult, 4u prbc and 1 plt   . 6/2 extubated  Interim History / Subjective:  Extubated yesterday to 4L  Tmax 96.6  CT R 490 out past 24 (50 overnight), -4.1 L admit, +700 past 24  Upon exam: Amio 30, levo 8, epi '2mg'$ , milrinone 0.125  Subjective: Denies SOB, denies chest pain, feels that "im not doing too bad," denies nausea  Objective   Blood pressure (!) 123/46, pulse (!) 122, temperature 97.7 F (36.5 C), resp. rate (!) 23, height '6\' 2"'$  (1.88 m), weight 80.9 kg, SpO2 100 %. 4LNC PAP: (47-67)/(16-34) 59/26 CVP:  [9 mmHg-26 mmHg] 26 mmHg CO:  [10.7 L/min] 10.7 L/min CI:  [5.5 L/min/m2] 5.5 L/min/m2      Intake/Output Summary (Last  24 hours) at 03/26/2021 0936 Last data filed at 03/26/2021 0900 Gross per 24 hour  Intake 2461.7 ml  Output 1756 ml  Net 705.7 ml   Filed Weights   02/22/2021 0500 03/25/21 0600 03/26/21 0500  Weight: 71.4 kg 80.9 kg 80.9 kg    Examination: General: sitting in bed, appears comfortable, no acute distess  HEENT: MM pink/moist, anicteric, RT IJ swan Neuro: GCS 15, RASS 0, PERRL 24m CV: S1S2, afib on monitor, no m/r/g appreciated PULM:  clear in the upper lobes and in the lower lobes, chest expansion symmetric, trachea midline BL CT to -20 with no air leak, serosang drianage.  GI: soft, bsx4 active, nondistended Extremities: warm/dry, no pretibial edema, capillary refill greater/less than 3 seconds  Skin: CT surgery sites to chest, CT site C/D/I  Labs/imaging that I havepersonally reviewed  (right click and "Reselect all SmartList Selections" daily)  DIC panel CBC BMP [facility]am[facility]   Acute Encephalopathy  Assessment & Plan:   Acute hypoxemic respiratory failure-Improved On 3LNC, Pulls 500 on IS -Continue aggressive pulmonary hygiene with IS and flutter valve -Will consider change in placement of Vascath to promote mobilization post removal of swan -Continue right chest tube to -20  CPR induced mediastinal hemorrhage post washout 6/1; ongoing oozing related to coagulopathy Cirrhotic and blood-loss induced coagulopathy Hemorrhagic Shock-improving Acute Blood Loss Anemia Thrombocytopenia- in setting of blood loss, coagulopathy, and cirrhosis HGB stable 9.7>9.8. AM platelets 47, INR 1.5 fibrinogen 362, no shistocites on DIC panel, ?HIT,  remains on pressors -Transfusing 1 U PLT -Transfuse PRBC if HBG less than 7 -Obtain AM CBC to trend H&H -Follow up HIT panel  -AM INR  In hospital cardiac arrest precipitated by respiratory distress- PEA, minimal downtime. CAD post CABG Cardiac Cirrhosis with ascites Hx HTN VT- On 6/1 COOX 78>76 -Management per CTS  and HF -On ASA 81 and atorvastatin -Continue amio drip for afib -AC held in the setting of bleeding -HF weaning vasopressors. Vaso off. Continue levophed. Goal MAP greater than 65. Titrate to goal. -HF continuing milrinone. Weaned to 0.125  ESRD on HD PTA -Appreciate Neph assistance -Continue CRRT per neph orders  DM2 BG 102-142, off insulin gtt -Continue SI -Blood Glucose goal 140-180.  Best practice (right click and "Reselect all SmartList Selections" daily)  Diet:  Oral Pain/Anxiety/Delirium protocol (if indicated): No VAP protocol (if indicated): Not indicated DVT prophylaxis: SCD and Contraindicated SCD GI prophylaxis: PPI Glucose control:  SSI Yes Central venous access:  Yes, and it is still needed Arterial line:  Yes, and it is still needed Foley:  N/A Mobility:  bed rest  PT consulted: Yes Last date of multidisciplinary goals of care discussion [per primary] Code Status:  full code Disposition: per primary   Critical care time: 30 minutes      Redmond School., MSN, APRN, AGACNP-BC [place]  03/26/2021 , 9:36 AM  Please see [email or web address] for pager details  If no response, please call [phone] After hours, please call Elink at [phone]

## 2021-03-26 NOTE — Progress Notes (Addendum)
Patient ID: David Browder., male   DOB: 01/31/1966, 55 y.o.   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  Based on low EF and elevated PA pressure, right and left heart cath was done in 5/22.  This showed severe 3VD, high cardiac output with moderate pulmonary hypertension but low PVR.  He is planned for CABG, but admitted pre-op for optimization.    Subjective:    CABG x 4 on 5/31 with LIMA-LAD, SVG-RCA, SVG-OM, SVG-D.   Coagulopathic post-op with multiple blood products.  Extubated post-op but developed respiratory distress => PEA arrest then VT. Reintubated.  ROSC with ACLS.  Returned to OR for mediastinal exploration 6/1.   Extubated 6/2  Currently on epinephrine 2, NE 7, vasopressin 0.02, milrinone 0.25, amiodarone 30.  CO-OX 76%   CVVH with UF running   HIT sent 6/2 -->Plts down to 47 . Chest tube drainage slowing   Swan #s PA 64/24 (38)  CO 9.8 CI 5   Feels ok. No appetite.    Objective:   Weight Range: 80.9 kg Body mass index is 22.9 kg/m.   Vital Signs:   Temp:  [95.72 F (35.4 C)-98.6 F (37 C)] 97.34 F (36.3 C) (06/03 0700) Pulse Rate:  [62-129] 99 (06/03 0700) Resp:  [10-55] 31 (06/03 0700) BP: (123-145)/(46-52) 123/46 (06/03 0400) SpO2:  [95 %-100 %] 100 % (06/03 0700) Arterial Line BP: (105-175)/(41-56) 125/43 (06/03 0700) FiO2 (%):  [40 %] 40 % (06/02 0800) Weight:  [80.9 kg] 80.9 kg (06/03 0500) Last BM Date: 03/22/2021  Weight change: Filed Weights   02/22/2021 0500 03/25/21 0600 03/26/21 0500  Weight: 71.4 kg 80.9 kg 80.9 kg    Intake/Output:   Intake/Output Summary (Last 24 hours) at 03/26/2021 0717 Last data filed at 03/26/2021 0700 Gross per 24 hour  Intake 2486.35 ml   Output 1773 ml  Net 713.35 ml      Physical Exam  CVP 13   General: In bed. Pale  HEENT: + Cortrak Neck: supple. JVP difficult to assess. Carotids 2+ bilat; no bruits. No lymphadenopathy or thryomegaly appreciated. Cor: PMI nondisplaced. Irregular rate & rhythm. No rubs, gallops or murmurs. Lungs: clear on 3 liters . CT x3.  Abdomen: soft, nontender, nondistended. No hepatosplenomegaly. No bruits or masses. Good bowel sounds. Extremities: no cyanosis, clubbing, rash,R and LLE SCDs.  Neuro: alert & orientedx3, cranial nerves grossly intact. moves all 4 extremities w/o difficulty. Affect pleasant   Telemetry   A fib 100-120s   EKG    No new EKG to review   Labs    CBC Recent Labs    03/25/21 1523 03/25/21 1827 03/26/21 0005 03/26/21 0442  WBC 15.8*  --   --  17.5*  HGB 9.7*  --   --  9.8*  HCT 28.5*  --   --  29.0*  MCV 89.1  --   --  91.5  PLT 45*   < > 46* 42*  47*   < > = values in this interval not displayed.   Basic Metabolic Panel Recent Labs    03/25/21 0442 03/25/21 1523 03/26/21 0442  NA 138 139 134*  K 3.9 4.7 4.7  CL 103 103 101  CO2 '24 27 26  '$ GLUCOSE 141* 115* 162*  BUN '16 13 11  '$ CREATININE 2.16* 1.95* 1.74*  CALCIUM 8.6* 8.3* 8.0*  MG 2.0  --  2.2  PHOS 3.0 4.3 3.5   Liver Function Tests Recent Labs    03/25/21 0442 03/25/21 1523 03/26/21 0442  AST 34  --  22  ALT 14  --  8  ALKPHOS 41  --  56  BILITOT 3.2*  --  4.0*  PROT 5.0*  --  5.3*  ALBUMIN 3.1* 3.6 3.2*   No results for input(s): LIPASE, AMYLASE in the last 72 hours. Cardiac Enzymes No results for input(s): CKTOTAL, CKMB, CKMBINDEX, TROPONINI in the last 72 hours.  BNP: BNP (last 3 results) Recent Labs    03/10/2021 1545  BNP 1,127.8*    ProBNP (last 3 results) No results for input(s): PROBNP in the last 8760 hours.   D-Dimer Recent Labs    03/26/21 0005 03/26/21 0442  DDIMER 1.79* 1.87*   Hemoglobin A1C No results for input(s): HGBA1C in the last 72  hours. Fasting Lipid Panel Recent Labs    03/25/21 0442  TRIG 84   Thyroid Function Tests No results for input(s): TSH, T4TOTAL, T3FREE, THYROIDAB in the last 72 hours.  Invalid input(s): FREET3  Other results:   Imaging    No results found.   Medications:     Scheduled Medications: . sodium chloride   Intravenous Once  . sodium chloride   Intravenous Once  . sodium chloride   Intravenous Once  . sodium chloride   Intravenous Once  . acetaminophen  1,000 mg Oral Q6H   Or  . acetaminophen (TYLENOL) oral liquid 160 mg/5 mL  1,000 mg Per Tube Q6H  . aspirin EC  81 mg Oral Daily  . atorvastatin  40 mg Oral Daily  . B-complex with vitamin C  1 tablet Oral Daily  . bisacodyl  10 mg Oral Daily   Or  . bisacodyl  10 mg Rectal Daily  . Chlorhexidine Gluconate Cloth  6 each Topical Daily  . cinacalcet  30 mg Oral Q T,Th,Sa-HD  . darbepoetin (ARANESP) injection - DIALYSIS  60 mcg Intravenous Q Thu-HD  . docusate sodium  200 mg Oral Daily  . insulin aspart  0-24 Units Subcutaneous Q4H  . mouth rinse  15 mL Mouth Rinse BID  . pantoprazole  40 mg Oral Daily  . sodium chloride flush  10-40 mL Intracatheter Q12H  . sodium chloride flush  3 mL Intravenous Q12H    Infusions: .  prismasol BGK 4/2.5 400 mL/hr at 03/26/21 0115  .  prismasol BGK 4/2.5 200 mL/hr at 03/25/21 1229  . sodium chloride Stopped (03/30/2021 0413)  . sodium chloride    . sodium chloride Stopped (03/25/21 2138)  . amiodarone 30 mg/hr (03/26/21 0700)  . epinephrine 2 mcg/min (03/26/21 0700)  . feeding supplement (VITAL 1.5 CAL) 1,000 mL (03/25/21 1416)  . lactated ringers    . lactated ringers    . lactated ringers 20 mL/hr at 03/26/21 0700  . milrinone 0.25 mcg/kg/min (03/26/21 0700)  . nitroGLYCERIN Stopped (02/27/2021 1904)  . norepinephrine (LEVOPHED) Adult infusion 5 mcg/min (03/26/21 0700)  . phenylephrine (NEO-SYNEPHRINE) Adult infusion Stopped (03/29/2021 0504)  . prismasol BGK 4/2.5 1,500 mL/hr at  03/26/21 0640  . vasopressin 0.02 Units/min (03/26/21 0700)    PRN Medications: sodium chloride, heparin, lactated ringers, lidocaine (PF), lip balm, metoprolol tartrate, morphine injection, ondansetron (ZOFRAN) IV, oxyCODONE,  sodium chloride, sodium chloride flush, sodium chloride flush, traMADol     Assessment/Plan   1. CAD: Severe 3VD with decreased EF.  I reviewed films with Dr. Ellyn Hack, PCI would be possible but would be difficult/high risk with multiple lesions and heavy calcification. 5/31 LIMA-LAD, SVG-RCA, SVG-OM, SVG-D. - Continue ASA 81.  - Continue atorvastatin 40 mg daily.  2. Acute/chronic HF with mid range EF => cardiogenic shock post-CABG: Suspect ischemic cardiomyopathy.  Echo 4/22 with EF 40-45% with moderately decreased RV systolic function and PASP 99, moderate TR. There was a prominent component of RV failure with RA pressure elevated out of proportion to PCWP (CVP/PCWP 0.875 on RHC) but PAPI adequate at 2.8. Post-op had PEA arrest then VT.  -Currently on epinephrine 2, NE 7, vasopressin 0.02, milrinone 0.25 with stable MAP.  CO-OX 76%  -CO 9.8 CI 5   -Continue to wean milrinone to 0.125  - Wean pressors, start with vasopressin.   - CVVH even for now  - CT drainage slowing.   3. ESRD: Suspected due to diabetes.  Volume up post-op with multiple blood products. - CVVH even currently with high CT output.   4. 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  The CO was not markedly high.  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 Oxygen saturation was 99% on RA pre-op, so no evidence for hepatopulmonary syndrome.    - Decrease milrinone to 0.125 today.   - Transition back to sildenafil eventually.    5. Cirrhosis: Noted on abdominal US from 2020.  H/o paracentesis.  Had workup at [facility] (though patient does not remember this), viral hepatitis labs negative.  They  ended up think that the cirrhosis was cardiogenic.  Repeat abdominal US 5/23 w/ nodular hepatic parenchymal pattern with increased echogenicity consistent with the patient's known cirrhosis. No focal hepatic abnormality identified. Portal vein is patent. Moderate ascites. GI has seen, patient had 4L paracentesis on 5/26. 6. Coagulopathy: Post-op bleeding in setting of coagulopathy post-CABG in patient with cirrhosis.  Currently seems controlled. HIT sent  7. VT: Had after PEA arrest am 6/1, continue amiodarone gtt 30.  8. Anemia: ABLA.  Stable today hgb 9.8. Follow CBC daily.   9. Pleural effusions: Right effusion with chest tube, ?hepatic hydrothorax.  CT drainage slowing.     10. PEA arrest: Respiratory arrest post-extubation.  ROSC with CPR.  11. Acute hypoxemic respiratory failure:Extubated 6/2  On 3 liters [place]. Sats stable.  12. A fib  -Continue amio drip.  - HIT panel pending. Platelets < 50. No anticoagulation for now.   Continue to slowly wean pressors. [place] today. Consult PT.     Length of Stay: [place], NP  03/26/2021, 7:17 AM  Advanced Heart Failure Team Pager [phone] (M-F; 7a - 5p)  Please contact [facility] for night-coverage after hours (5p -7a ) and weekends on [email or web address]  Patient seen with NP, agree with the above note.   Patient is in atrial fibrillation rate 90s on amiodarone gtt.  No anticoagulation yet with bleeding and low plts.  HIT sent.    MAP stable on epinephrine 2, NE 7, milrinone 0.25, vasopressin 0.01.  CVP 13 with co-ox and thermo CI excellent.  PASP in 22s off Swan.   General: NAD Neck: JVP 12 cm, no thyromegaly or thyroid nodule.  Lungs: Crackles at bases.  CV: Nondisplaced PMI.  Heart irregular S1/S2, no S3/S4, no murmur.  1+ ankle edema. Abdomen: Soft, nontender, no hepatosplenomegaly, no distention.  Skin: Intact without lesions or rashes.  Neurologic: Alert and oriented x 3.  Psych: Normal affect. Extremities: No clubbing or  cyanosis.  HEENT: Normal.   Continue gradual pressor wean, stop vasopressin and then titrate down epinephrine.  Can decrease milrinone to 0.125 today but would continue it until we start back on sildenafil (when pressors lower).   Can pull UF 50-100 net negative via CVVH.   HIT pending, no anticoagulation for now (also recent post-op bleeding).  Suspect low platelets may be post-op.  Continue amiodarone gtt.   CRITICAL CARE Performed by: Loralie Champagne  Total critical care time: 35 minutes  Critical care time was exclusive of separately billable procedures and treating other patients.  Critical care was necessary to treat or prevent imminent or life-threatening deterioration.  Critical care was time spent personally by me on the following activities: development of treatment plan with patient and/or surrogate as well as nursing, discussions with consultants, evaluation of patient's response to treatment, examination of patient, obtaining history from patient or surrogate, ordering and performing treatments and interventions, ordering and review of laboratory studies, ordering and review of radiographic studies, pulse oximetry and re-evaluation of patient's condition.  Loralie Champagne 03/26/2021 8:05 AM

## 2021-03-26 NOTE — Progress Notes (Signed)
OT Cancellation Note  Patient Details Name: David Rose. MRN: [id number] DOB: 1966/03/20   Cancelled Treatment:    Reason Eval/Treat Not Completed: Patient not medically ready, OT to continue efforts as appropriate.  Jillianne Gamino D Marsha Hillman 03/26/2021, 11:19 AM  03/26/2021  Denice Paradise, OTR/L  Acute Rehabilitation Services  Office:  [phone]

## 2021-03-26 NOTE — Progress Notes (Signed)
PT Cancellation Note  Patient Details Name: David Rose. MRN: [id number] DOB: 03-Mar-1966   Cancelled Treatment:    Reason Eval/Treat Not Completed: Patient not medically ready (Pt remains with [facility] and not yet appropriate for mobility)   Adib Wahba B Lori-Ann Lindfors 03/26/2021, 7:35 AM  [place] Pager: [phone] Office: [phone]

## 2021-03-26 NOTE — Progress Notes (Signed)
Patient ID: David Rose., male   DOB: Oct 08, 1966, 55 y.o.   MRN: [id number] [facility] Progress Note   Assessment/ Plan:   1.  Coronary artery disease: With previously documented severe three-vessel disease and decreased ejection fraction and yesterday had four-vessel CABG by Dr. Orvan Seen.  Postoperatively suffered PEA arrest after extubation requiring resuscitation with CPR and required mediastinal wound exploration for bleeding.  He is currently hemodynamically stable. 2.  End-stage renal disease: He is usually on a Tuesday/Thursday/Saturday dialysis schedule via right upper arm AV fistula and last had hemodialysis on 5/30.  We will continue CRRT at this time for better regulation of volume status as he is getting about 70 to 80 cc/h input; cardiology would like some ultrafiltration. 3.  Pulmonary hypertension/chronic systolic heart failure: Requiring volume resuscitation following bleeding/PEA arrest as well as products for anticoagulation. 4.  Anemia of chronic kidney disease: Hemoglobin and hematocrit within goal, continue to treat with ESA and monitor for overt losses. 5.  Chronic kidney disease/metabolic bone disease: Calcium and phosphorus level within acceptable range and on 3 times weekly Sensipar for PTH control. 6.  History of cirrhosis with recurrent ascites: Paracentesis periodically as indicated.  Subjective:   Extubated yesterday and is done well overnight.  Continues to tolerate CRRT without escalation of pressor requirements.   Objective:   BP (!) 123/46 (BP Location: Other (Comment)) Comment (BP Location): aline  Pulse (!) 107   Temp (!) 97.52 F (36.4 C)   Resp (!) 32   Ht '6\' 2"'$  (1.88 m)   Wt 80.9 kg   SpO2 98%   BMI 22.90 kg/m   Intake/Output Summary (Last 24 hours) at 03/26/2021 0818 Last data filed at 03/26/2021 0800 Gross per 24 hour  Intake 2492.26 ml  Output 1727 ml  Net 765.26 ml   Weight change: 0 kg  Physical Exam: Gen: Appears  comfortable sitting propped up in bed.  Nurse changing sternal dressing. CVS: Pulse regular rhythm, normal rate, sternal wound well apposed, distant heart sounds Resp: Clear to auscultation bilaterally, no rales/rhonchi over anterior chest Abd: Soft, flat, nontender Ext: No lower extremity edema, right upper arm AV fistula with poor augmentation and low pitched bruit  Imaging: DG Chest 1 View  Result Date: 04/14/2021 CLINICAL DATA:  Status post chest tube placement. EXAM: CHEST  1 VIEW COMPARISON:  Single-view of the chest earlier today. FINDINGS: A new pigtail catheter projects in the right lower chest. Support tubes and lines are otherwise unchanged. Right effusion and airspace disease appear unchanged. No pneumothorax. The left lung is clear. Heart size is normal. IMPRESSION: New pigtail catheter projects in the lower right chest. No change in a right effusion and airspace disease. No pneumothorax or other new abnormality. Electronically Signed   By: Inge Rise M.D.   On: 04/11/2021 10:22   DG CHEST PORT 1 VIEW  Result Date: 03/25/2021 CLINICAL DATA:  Chest tube in place.  History of open heart surgery. EXAM: PORTABLE CHEST 1 VIEW COMPARISON:  March 24, 2021. FINDINGS: Endotracheal tube tip projects approximately 4.5 cm above the carina. Right IJ approach Swan-Ganz catheter, bilateral chest tubes, and mediastinal drain are in similar position. Enteric tube courses below the diaphragm in outside the field of view. Improved aeration of the right lung base with streaky left basilar opacities. Improved right pleural effusion. No visible pneumothorax on this single AP semi erect radiograph. IMPRESSION: 1. Improved right pleural effusion and right basilar opacities. Mild streaky left basilar opacities, most likely  atelectasis in the postoperative setting. 2. Support devices, as detailed above. Electronically Signed   By: Margaretha Sheffield MD   On: 03/25/2021 06:55   DG Abd Portable 1V  Result Date:  04/09/2021 CLINICAL DATA:  Status post feeding tube placement. EXAM: PORTABLE ABDOMEN - 1 VIEW COMPARISON:  None. FINDINGS: Feeding tube tip is in the distal stomach directed toward the duodenum. Moderate gaseous distention of the stomach noted. IMPRESSION: As above. Electronically Signed   By: Inge Rise M.D.   On: 04/20/2021 13:15   EEG adult  Result Date: 04/11/2021 Lora Havens, MD     04/22/2021  3:47 PM Patient Name: David Rose. MRN: [id number] Epilepsy Attending: Lora Havens Referring Physician/Provider: Dr Ina Homes Date: 04/13/2021 Duration: 23.04 mins Patient history: 55yo M s/p cardiac arrest. EEG to evaluate for seizure Level of alertness: lethargic AEDs during EEG study: None Technical aspects: This EEG study was done with scalp electrodes positioned according to the 10-20 International system of electrode placement. Electrical activity was acquired at a sampling rate of '500Hz'$  and reviewed with a high frequency filter of '70Hz'$  and a low frequency filter of '1Hz'$ . EEG data were recorded continuously and digitally stored. Description: EEG showed continuous generalized 3 to 6 Hz theta-delta slowing.  Hyperventilation and photic stimulation were not performed.   Patient was noted to have head jerking and bilateral shoulder twitching. Concomitant EEG change before, during and after the event didn't show any EEG change to suggest seizure. ABNORMALITY - Continuous slow, generalized IMPRESSION: This study is suggestive of moderate to severe diffuse encephalopathy, nonspecific etiology. No seizures or epileptiform discharges were seen throughout the recording. Patient was noted to have head jerking and bilateral shoulder twitching without concomitant EEG change and was most likely NOT epileptic. [place]: BMET Recent [employer] 03/21/21 0125 03/22/21 0636 03/25/2021 [id number] 04/19/2021 0720 04/04/2021 0757 04/14/2021 1232 04/12/2021 1402 04/21/2021 1630 03/25/21 0004  03/25/21 0442 03/25/21 1523 03/26/21 0442  NA 134*   < > 142   < > 144  144 142 142 140 138 138 139 134*  K 3.8   < > 3.5   < > 3.1*  3.1* 2.9* 3.2* 3.4* 3.8 3.9 4.7 4.7  CL 97*   < > 103  --  104 107  --  105 106 103 103 101  CO2 28   < > 18*  --   --  19*  --  '22 22 24 27 26  '$ GLUCOSE 141*   < > 280*  --  264* 198*  --  120* 159* 141* 115* 162*  BUN 18   < > 22*  --  20 21*  --  '19 16 16 13 11  '$ CREATININE 3.60*   < > 3.51*  --  3.10* 3.14*  --  2.69* 2.21* 2.16* 1.95* 1.74*  CALCIUM 8.6*   < > 8.0*  --   --  8.6*  --  8.9 8.1* 8.6* 8.3* 8.0*  PHOS 3.3  --   --   --   --   --   --  3.3  --  3.0 4.3 3.5   < > = values in this interval not displayed.   CBC Recent Labs  Lab 03/25/21 0004 03/25/21 0442 03/25/21 1244 03/25/21 1523 03/25/21 1827 03/26/21 0005 03/26/21 0442  WBC 14.2* 14.0*  --  15.8*  --   --  17.5*  HGB 10.5* 10.7*  --  9.7*  --   --  9.8*  HCT 28.9* 30.1*  --  28.5*  --   --  29.0*  MCV 84.0 85.3  --  89.1  --   --  91.5  PLT 58*  60* 62*  59*   < > 45* 46* 46* 42*  47*   < > = values in this interval not displayed.   Medications:    . sodium chloride   Intravenous Once  . sodium chloride   Intravenous Once  . sodium chloride   Intravenous Once  . sodium chloride   Intravenous Once  . acetaminophen  1,000 mg Oral Q6H   Or  . acetaminophen (TYLENOL) oral liquid 160 mg/5 mL  1,000 mg Per Tube Q6H  . aspirin EC  81 mg Oral Daily  . atorvastatin  40 mg Oral Daily  . B-complex with vitamin C  1 tablet Oral Daily  . bisacodyl  10 mg Oral Daily   Or  . bisacodyl  10 mg Rectal Daily  . Chlorhexidine Gluconate Cloth  6 each Topical Daily  . cinacalcet  30 mg Oral Q T,Th,Sa-HD  . [START ON 04/01/2021] darbepoetin (ARANESP) injection - NON-DIALYSIS  60 mcg Subcutaneous Q Thu-1800  . docusate sodium  200 mg Oral Daily  . insulin aspart  0-24 Units Subcutaneous Q4H  . mouth rinse  15 mL Mouth Rinse BID  . pantoprazole  40 mg Oral Daily  . sodium chloride flush   10-40 mL Intracatheter Q12H  . sodium chloride flush  3 mL Intravenous Q12H   Elmarie Shiley, MD 03/26/2021, 8:18 AM

## 2021-03-26 NOTE — Progress Notes (Signed)
EVENING ROUNDS NOTE :     [address]       [place] [address]             [phone]                 2 Days Post-Op Procedure(s) (LRB): EXPLORATION POST OPERATIVE OPEN HEART (N/A)   Total Length of Stay:  LOS: 11 days  Events:   No events    BP (!) 123/46 (BP Location: Other (Comment)) Comment (BP Location): aline  Pulse (!) 102   Temp (!) 97.16 F (36.2 C)   Resp (!) 22   Ht '6\' 2"'$  (1.88 m)   Wt 80.9 kg   SpO2 99%   BMI 22.90 kg/m   PAP: (50-67)/(19-34) 67/25 CVP:  [10 mmHg-26 mmHg] 12 mmHg     .  prismasol BGK 4/2.5 400 mL/hr at 03/26/21 1359  .  prismasol BGK 4/2.5 200 mL/hr at 03/26/21 1359  . sodium chloride    . sodium chloride Stopped (03/26/21 1410)  . amiodarone 30 mg/hr (03/26/21 1700)  . epinephrine 2 mcg/min (03/26/21 1700)  . feeding supplement (VITAL 1.5 CAL) 50 mL/hr at 03/26/21 1401  . lactated ringers    . lactated ringers    . lactated ringers 20 mL/hr at 03/26/21 1700  . milrinone 0.125 mcg/kg/min (03/26/21 1700)  . norepinephrine (LEVOPHED) Adult infusion 8 mcg/min (03/26/21 1700)  . phenylephrine (NEO-SYNEPHRINE) Adult infusion Stopped (03/27/2021 0504)  . prismasol BGK 4/2.5 1,500 mL/hr at 03/26/21 1337    I/O last 3 completed shifts: In: 3731.1 [P.O.:270; I.V.:2445; Blood:279; NG/GT:424.7; IV Piggyback:312.4] Out: [id number] [Urine:347; Other:1707; Chest Tube:2010]   CBC Latest Ref Rng & Units 03/26/2021 03/26/2021 03/26/2021  WBC 4.0 - 10.5 K/uL - - 17.5(H)  Hemoglobin 13.0 - 17.0 g/dL - - 9.8(L)  Hematocrit 39.0 - 52.0 % - - 29.0(L)  Platelets 150 - 400 K/uL PLATELET CLUMPS NOTED ON SMEAR, UNABLE TO ESTIMATE 42(L) 47(L)    BMP Latest Ref Rng & Units 03/26/2021 03/25/2021 03/25/2021  Glucose 70 - 99 mg/dL 162(H) 115(H) 141(H)  BUN 6 - 20 mg/dL '11 13 16  '$ Creatinine 0.61 - 1.24 mg/dL 1.74(H) 1.95(H) 2.16(H)  Sodium 135 - 145 mmol/L 134(L) 139 138  Potassium 3.5 - 5.1 mmol/L 4.7 4.7 3.9  Chloride 98 - 111 mmol/L 101 103 103   CO2 22 - 32 mmol/L '26 27 24  '$ Calcium 8.9 - 10.3 mg/dL 8.0(L) 8.3(L) 8.6(L)    ABG    Component Value Date/Time   PHART 7.453 (H) 04/22/2021 1402   PCO2ART 27.5 (L) 04/08/2021 1402   PO2ART 97 04/14/2021 1402   HCO3 19.4 (L) 03/27/2021 1402   TCO2 20 (L) 04/09/2021 1402   ACIDBASEDEF 4.0 (H) 04/14/2021 1402   O2SAT 76.0 03/26/2021 0442       Melodie Bouillon, MD 03/26/2021 5:39 PM

## 2021-03-27 ENCOUNTER — Encounter (HOSPITAL_COMMUNITY): Payer: Self-pay | Admitting: Cardiothoracic Surgery

## 2021-03-27 ENCOUNTER — Inpatient Hospital Stay (HOSPITAL_COMMUNITY): Payer: Medicare Other

## 2021-03-27 DIAGNOSIS — J90 Pleural effusion, not elsewhere classified: Secondary | ICD-10-CM

## 2021-03-27 DIAGNOSIS — J9601 Acute respiratory failure with hypoxia: Secondary | ICD-10-CM | POA: Diagnosis not present

## 2021-03-27 DIAGNOSIS — J810 Acute pulmonary edema: Secondary | ICD-10-CM | POA: Diagnosis not present

## 2021-03-27 DIAGNOSIS — R570 Cardiogenic shock: Secondary | ICD-10-CM | POA: Diagnosis not present

## 2021-03-27 LAB — CBC
HCT: 31.1 % — ABNORMAL LOW (ref 39.0–52.0)
HCT: 29.8 % — ABNORMAL LOW (ref 39.0–52.0)
Hemoglobin: 9.9 g/dL — ABNORMAL LOW (ref 13.0–17.0)
Hemoglobin: 9.8 g/dL — ABNORMAL LOW (ref 13.0–17.0)
MCH: 30.5 pg (ref 26.0–34.0)
MCH: 30.8 pg (ref 26.0–34.0)
MCHC: 31.8 g/dL (ref 30.0–36.0)
MCHC: 32.9 g/dL (ref 30.0–36.0)
MCV: 95.7 fL (ref 80.0–100.0)
MCV: 93.7 fL (ref 80.0–100.0)
Platelets: 51 10*3/uL — ABNORMAL LOW (ref 150–400)
Platelets: 57 10*3/uL — ABNORMAL LOW (ref 150–400)
RBC: 3.25 MIL/uL — ABNORMAL LOW (ref 4.22–5.81)
RBC: 3.18 MIL/uL — ABNORMAL LOW (ref 4.22–5.81)
RDW: 19.2 % — ABNORMAL HIGH (ref 11.5–15.5)
RDW: 19.1 % — ABNORMAL HIGH (ref 11.5–15.5)
WBC: 22.1 10*3/uL — ABNORMAL HIGH (ref 4.0–10.5)
WBC: 23.2 10*3/uL — ABNORMAL HIGH (ref 4.0–10.5)
nRBC: 0 % (ref 0.0–0.2)
nRBC: 0 % (ref 0.0–0.2)

## 2021-03-27 LAB — COMPREHENSIVE METABOLIC PANEL
ALT: 6 U/L (ref 0–44)
AST: 26 U/L (ref 15–41)
Albumin: 3 g/dL — ABNORMAL LOW (ref 3.5–5.0)
Alkaline Phosphatase: 82 U/L (ref 38–126)
Anion gap: 10 (ref 5–15)
BUN: 14 mg/dL (ref 6–20)
CO2: 25 mmol/L (ref 22–32)
Calcium: 8 mg/dL — ABNORMAL LOW (ref 8.9–10.3)
Chloride: 100 mmol/L (ref 98–111)
Creatinine, Ser: 1.74 mg/dL — ABNORMAL HIGH (ref 0.61–1.24)
GFR, Estimated: 46 mL/min — ABNORMAL LOW (ref >60)
Glucose, Bld: 153 mg/dL — ABNORMAL HIGH (ref 70–99)
Potassium: 4.6 mmol/L (ref 3.5–5.1)
Sodium: 135 mmol/L (ref 135–145)
Total Bilirubin: 5.8 mg/dL — ABNORMAL HIGH (ref 0.3–1.2)
Total Protein: 5.5 g/dL — ABNORMAL LOW (ref 6.5–8.1)

## 2021-03-27 LAB — COOXEMETRY PANEL
Carboxyhemoglobin: 1.8 % — ABNORMAL HIGH (ref 0.5–1.5)
Methemoglobin: 1.2 % (ref 0.0–1.5)
O2 Saturation: 75.1 %
Total hemoglobin: 10.5 g/dL — ABNORMAL LOW (ref 12.0–16.0)

## 2021-03-27 LAB — RENAL FUNCTION PANEL
Albumin: 2.7 g/dL — ABNORMAL LOW (ref 3.5–5.0)
Anion gap: 7 (ref 5–15)
BUN: 15 mg/dL (ref 6–20)
CO2: 26 mmol/L (ref 22–32)
Calcium: 7.7 mg/dL — ABNORMAL LOW (ref 8.9–10.3)
Chloride: 102 mmol/L (ref 98–111)
Creatinine, Ser: 1.69 mg/dL — ABNORMAL HIGH (ref 0.61–1.24)
GFR, Estimated: 47 mL/min — ABNORMAL LOW (ref >60)
Glucose, Bld: 101 mg/dL — ABNORMAL HIGH (ref 70–99)
Phosphorus: 1.9 mg/dL — ABNORMAL LOW (ref 2.5–4.6)
Potassium: 4.6 mmol/L (ref 3.5–5.1)
Sodium: 135 mmol/L (ref 135–145)

## 2021-03-27 LAB — DIC (DISSEMINATED INTRAVASCULAR COAGULATION)PANEL
D-Dimer, Quant: 3.36 ug/mL-FEU — ABNORMAL HIGH (ref 0.00–0.50)
D-Dimer, Quant: 4.77 ug/mL-FEU — ABNORMAL HIGH (ref 0.00–0.50)
D-Dimer, Quant: 5.4 ug/mL-FEU — ABNORMAL HIGH (ref 0.00–0.50)
D-Dimer, Quant: 5.06 ug/mL-FEU — ABNORMAL HIGH (ref 0.00–0.50)
Fibrinogen: 426 mg/dL (ref 210–475)
Fibrinogen: 466 mg/dL (ref 210–475)
Fibrinogen: 495 mg/dL — ABNORMAL HIGH (ref 210–475)
Fibrinogen: 497 mg/dL — ABNORMAL HIGH (ref 210–475)
INR: 1.6 — ABNORMAL HIGH (ref 0.8–1.2)
INR: 1.5 — ABNORMAL HIGH (ref 0.8–1.2)
INR: 1.4 — ABNORMAL HIGH (ref 0.8–1.2)
INR: 1.4 — ABNORMAL HIGH (ref 0.8–1.2)
Platelets: 51 10*3/uL — ABNORMAL LOW (ref 150–400)
Platelets: 53 10*3/uL — ABNORMAL LOW (ref 150–400)
Platelets: 54 10*3/uL — ABNORMAL LOW (ref 150–400)
Platelets: 57 10*3/uL — ABNORMAL LOW (ref 150–400)
Prothrombin Time: 19.1 seconds — ABNORMAL HIGH (ref 11.4–15.2)
Prothrombin Time: 18.4 seconds — ABNORMAL HIGH (ref 11.4–15.2)
Prothrombin Time: 17.2 seconds — ABNORMAL HIGH (ref 11.4–15.2)
Prothrombin Time: 17 seconds — ABNORMAL HIGH (ref 11.4–15.2)
Smear Review: NONE SEEN
Smear Review: NONE SEEN
Smear Review: NONE SEEN
Smear Review: NONE SEEN
aPTT: 44 seconds — ABNORMAL HIGH (ref 24–36)
aPTT: 45 seconds — ABNORMAL HIGH (ref 24–36)
aPTT: 44 seconds — ABNORMAL HIGH (ref 24–36)
aPTT: 44 seconds — ABNORMAL HIGH (ref 24–36)

## 2021-03-27 LAB — BPAM PLATELET PHERESIS
Blood Product Expiration Date: 202206032359
ISSUE DATE / TIME: 202206031240
Unit Type and Rh: 5100

## 2021-03-27 LAB — FIBRINOGEN: Fibrinogen: 476 mg/dL — ABNORMAL HIGH (ref 210–475)

## 2021-03-27 LAB — MAGNESIUM: Magnesium: 2.2 mg/dL (ref 1.7–2.4)

## 2021-03-27 LAB — GLUCOSE, CAPILLARY
Glucose-Capillary: 140 mg/dL — ABNORMAL HIGH (ref 70–99)
Glucose-Capillary: 132 mg/dL — ABNORMAL HIGH (ref 70–99)
Glucose-Capillary: 105 mg/dL — ABNORMAL HIGH (ref 70–99)
Glucose-Capillary: 156 mg/dL — ABNORMAL HIGH (ref 70–99)
Glucose-Capillary: 97 mg/dL (ref 70–99)
Glucose-Capillary: 142 mg/dL — ABNORMAL HIGH (ref 70–99)

## 2021-03-27 LAB — PROTIME-INR
INR: 1.5 — ABNORMAL HIGH (ref 0.8–1.2)
Prothrombin Time: 18.5 seconds — ABNORMAL HIGH (ref 11.4–15.2)

## 2021-03-27 LAB — PREPARE PLATELET PHERESIS: Unit division: 0

## 2021-03-27 MED ORDER — SILDENAFIL CITRATE 20 MG PO TABS
20.0000 mg | ORAL_TABLET | Freq: Three times a day (TID) | ORAL | Status: DC
  Administered 2021-03-27 – 2021-04-01 (×17): 20 mg via ORAL
  Filled 2021-03-27 (×17): qty 1

## 2021-03-27 NOTE — Progress Notes (Signed)
Patient ID: David Rose., male   DOB: 1966/04/10, 55 y.o.   MRN: [id number] [facility] Progress Note   Assessment/ Plan:   1.  Coronary artery disease: With previously documented severe three-vessel disease and decreased ejection fraction and yesterday had four-vessel CABG by Dr. Orvan Seen.  Postoperatively suffered PEA arrest after extubation requiring resuscitation with CPR and required mediastinal wound exploration for bleeding.  Hemodynamically stable at this time with decreasing pressor requirements, monitor with UF. 2.  End-stage renal disease: He is usually on a Tuesday/Thursday/Saturday dialysis schedule via right upper arm AV fistula and last had hemodialysis on 5/30 and thereafter started on CRRT postoperatively because of hemodynamic instability.  Restart CRRT for ultrafiltration with a goal of net -50 to 100 cc an hour. 3.  Pulmonary hypertension/chronic systolic heart failure: Requiring volume resuscitation following bleeding/PEA arrest as well as products for anticoagulation and now fluid positive, undertaking CRRT/ultrafiltration.  Plans noted by cardiology to start him on sildenafil.  4.  Anemia of chronic kidney disease: Hemoglobin and hematocrit acceptable range, continue to treat with ESA and monitor for overt losses. 5.  Chronic kidney disease/metabolic bone disease: Calcium and phosphorus level within acceptable range and on 3 times weekly Sensipar for PTH control. 6.  History of cirrhosis with recurrent ascites: Paracentesis periodically as indicated.  Subjective:   CRRT filter clotted this morning and discussed with cardiology who would like additional ultrafiltration to try and optimize volume status (CVP 12)   Objective:   BP (!) 123/46 (BP Location: Other (Comment)) Comment (BP Location): aline  Pulse (!) 105   Temp 98.6 F (37 C) (Core)   Resp 19   Ht '6\' 2"'$  (1.88 m)   Wt 80.5 kg   SpO2 100%   BMI 22.79 kg/m   Intake/Output Summary (Last 24 hours)  at 03/27/2021 0813 Last data filed at 03/27/2021 0800 Gross per 24 hour  Intake 2953.4 ml  Output 3193 ml  Net -239.6 ml   Weight change:   Physical Exam: Gen: Comfortably resting propped up in bed CVS: Pulse regular rhythm, normal rate, sternal wound well apposed, S1 with loud S2 Resp: Clear to auscultation bilaterally, no rales/rhonchi over anterior chest Abd: Soft, flat, nontender Ext: No lower extremity edema, right upper arm AV fistula with poor augmentation and low pitched bruit  Imaging: No results found.  Labs: BMET Recent Labs  Lab 03/21/21 0125 03/22/21 0636 04/20/2021 1630 03/25/21 0004 03/25/21 0442 03/25/21 1523 03/26/21 0442 03/26/21 1632 03/27/21 0509  NA 134*   < > 140 138 138 139 134* 137 135  K 3.8   < > 3.4* 3.8 3.9 4.7 4.7 4.6 4.6  CL 97*   < > 105 106 103 103 101 103 100  CO2 28   < > '22 22 24 27 26 28 25  '$ GLUCOSE 141*   < > 120* 159* 141* 115* 162* 100* 153*  BUN 18   < > '19 16 16 13 11 11 14  '$ CREATININE 3.60*   < > 2.69* 2.21* 2.16* 1.95* 1.74* 1.65* 1.74*  CALCIUM 8.6*   < > 8.9 8.1* 8.6* 8.3* 8.0* 8.1* 8.0*  PHOS 3.3  --  3.3  --  3.0 4.3 3.5 3.2  --    < > = values in this interval not displayed.   CBC Recent Labs  Lab 03/25/21 1523 03/25/21 1827 03/26/21 0442 03/26/21 1200 03/26/21 1724 03/27/21 0009 03/27/21 0509  WBC 15.8*  --  17.5*  --  18.9*  --  22.1*  HGB 9.7*  --  9.8*  --  9.7*  --  9.9*  HCT 28.5*  --  29.0*  --  30.3*  --  31.1*  MCV 89.1  --  91.5  --  94.1  --  95.7  PLT 45*   < > 42*  47* PLATELET CLUMPS NOTED ON SMEAR, UNABLE TO ESTIMATE 53*  50* 51* 53*  51*   < > = values in this interval not displayed.   Medications:    . sodium chloride   Intravenous Once  . sodium chloride   Intravenous Once  . sodium chloride   Intravenous Once  . sodium chloride   Intravenous Once  . acetaminophen  1,000 mg Oral Q6H   Or  . acetaminophen (TYLENOL) oral liquid 160 mg/5 mL  1,000 mg Per Tube Q6H  . aspirin EC  81 mg Oral  Daily  . atorvastatin  40 mg Oral Daily  . B-complex with vitamin C  1 tablet Oral Daily  . bisacodyl  10 mg Oral Daily   Or  . bisacodyl  10 mg Rectal Daily  . Chlorhexidine Gluconate Cloth  6 each Topical Daily  . cinacalcet  30 mg Oral Q T,Th,Sa-HD  . [START ON 04/01/2021] darbepoetin (ARANESP) injection - NON-DIALYSIS  60 mcg Subcutaneous Q Thu-1800  . docusate sodium  200 mg Oral Daily  . feeding supplement (PROSource TF)  45 mL Per Tube QID  . insulin aspart  0-24 Units Subcutaneous Q4H  . mouth rinse  15 mL Mouth Rinse BID  . pantoprazole  40 mg Oral Daily  . sodium chloride flush  10-40 mL Intracatheter Q12H  . sodium chloride flush  3 mL Intravenous Q12H   Elmarie Shiley, MD 03/27/2021, 8:13 AM

## 2021-03-27 NOTE — Progress Notes (Signed)
      [address]       [place] [address]             [phone]                 3 Days Post-Op Procedure(s) (LRB): EXPLORATION POST OPERATIVE OPEN HEART (N/A)   Events: No events _______________________________________________________________ Vitals: BP (!) 123/46 (BP Location: Other (Comment)) Comment (BP Location): aline  Pulse (!) 106   Temp 98.96 F (37.2 C)   Resp 17   Ht '6\' 2"'$  (1.88 m)   Wt 80.5 kg   SpO2 100%   BMI 22.79 kg/m   - Neuro: alert NAD  - Cardiovascular: sinus tach  Drips: amio, milr 0.125, levo 14.   PAP: (52-70)/(17-26) 53/19 CVP:  [6 mmHg-14 mmHg] 11 mmHg CO:  [9.6 L/min-10.6 L/min] 10 L/min CI:  [4.9 L/min/m2-5.4 L/min/m2] 5 L/min/m2  - Pulm: EWOB    ABG    Component Value Date/Time   PHART 7.453 (H) 04/18/2021 1402   PCO2ART 27.5 (L) 04/10/2021 1402   PO2ART 97 04/07/2021 1402   HCO3 19.4 (L) 04/01/2021 1402   TCO2 20 (L) 04/09/2021 1402   ACIDBASEDEF 4.0 (H) 04/05/2021 1402   O2SAT 75.1 03/27/2021 0512    - Abd: ND - Extremity: warm  .Intake/Output      06/03 0701 06/04 0700 06/04 0701 06/05 0700   P.O.     I.V. (mL/kg) 1616.4 (20) 118.6 (1.5)   Blood 325    NG/GT 990 100   IV Piggyback     Total Intake(mL/kg) 2931.4 (36.2) 218.6 (2.7)   Urine (mL/kg/hr)     Other 2533    Chest Tube 670 80   Total Output 3203 80   Net -271.6 +138.6           _______________________________________________________________ Labs: CBC Latest Ref Rng & Units 03/27/2021 03/27/2021 03/27/2021  WBC 4.0 - 10.5 K/uL - 22.1(H) -  Hemoglobin 13.0 - 17.0 g/dL - 9.9(L) -  Hematocrit 39.0 - 52.0 % - 31.1(L) -  Platelets 150 - 400 K/uL 53(L) 51(L) 51(L)   CMP Latest Ref Rng & Units 03/27/2021 03/26/2021 03/26/2021  Glucose 70 - 99 mg/dL 153(H) 100(H) 162(H)  BUN 6 - 20 mg/dL '14 11 11  '$ Creatinine 0.61 - 1.24 mg/dL 1.74(H) 1.65(H) 1.74(H)  Sodium 135 - 145 mmol/L 135 137 134(L)  Potassium 3.5 - 5.1 mmol/L 4.6 4.6 4.7  Chloride 98 -  111 mmol/L 100 103 101  CO2 22 - 32 mmol/L '25 28 26  '$ Calcium 8.9 - 10.3 mg/dL 8.0(L) 8.1(L) 8.0(L)  Total Protein 6.5 - 8.1 g/dL 5.5(L) - 5.3(L)  Total Bilirubin 0.3 - 1.2 mg/dL 5.8(H) - 4.0(H)  Alkaline Phos 38 - 126 U/L 82 - 56  AST 15 - 41 U/L 26 - 22  ALT 0 - 44 U/L 6 - 8    CXR: L Effusion  _______________________________________________________________  Assessment and Plan: POD 4 s/p Cabg, POD 3 s/p re-exploration  Neuro: pain controlled.  MS improved CV: on milr, and levo.  Off epi. Pulm: continue pulm toilet Renal: on CRRT.   GI: tube feeds going Heme: hgb stable.  Plts 51.  Coming up slowly.  Awaiting HIT panel ID: [id number].  ?pneumonia on L Endo: SSI Dispo: continue ICU care   [place] 03/27/2021 10:04 AM

## 2021-03-27 NOTE — Progress Notes (Signed)
Patient ID: David Rose., male   DOB: 07-15-1966, 55 y.o.   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  Based on low EF and elevated PA pressure, right and left heart cath was done in 5/22.  This showed severe 3VD, high cardiac output with moderate pulmonary hypertension but low PVR.  He is planned for CABG, but admitted pre-op for optimization.    Subjective:    CABG x 4 on 5/31 with LIMA-LAD, SVG-RCA, SVG-OM, SVG-D.   Coagulopathic post-op with multiple blood products.  Extubated post-op but developed respiratory distress => PEA arrest then VT. Reintubated.  ROSC with ACLS.  Returned to OR for mediastinal exploration 6/1.  Extubated 6/2  Currently on NE 14, milrinone 0.125, amiodarone 30.  Co-ox 75%, CVP 12.  ?NSR 100s.   CVVH with UF running, aimed for about 50 cc/hr yesterday.    HIT sent 6/2 -->Plts 47 => 51. Chest tube drainage slowing   Swan #s PA 60/21  CI 5   No complaints this morning.    Objective:   Weight Range: 80.5 kg Body mass index is 22.79 kg/m.   Vital Signs:   Temp:  [97.16 F (36.2 C)-98.6 F (37 C)] 98.6 F (37 C) (06/04 0803) Pulse Rate:  [99-122] 105 (06/04 0803) Resp:  [11-40] 19 (06/04 0803) SpO2:  [90 %-100 %] 100 % (06/04 0803) Arterial Line BP: (101-164)/(35-51) 143/44 (06/04 0803) Weight:  [80.5 kg] 80.5 kg (06/04 0715) Last BM Date: 03/02/2021  Weight change: Filed Weights   03/25/21 0600 03/26/21 0500 03/27/21 0715  Weight: 80.9 kg 80.9 kg 80.5 kg    Intake/Output:   Intake/Output Summary (Last 24 hours) at 03/27/2021 0820 Last data filed at 03/27/2021 0800 Gross per 24 hour  Intake 2953.4 ml  Output 3193 ml  Net -239.6 ml      Physical Exam  CVP 12   General: NAD Neck: JVP 10-12 cm. no thyromegaly or thyroid nodule.  Lungs: Decreased at bases. CV: Nondisplaced PMI.  Heart regular S1/S2, no S3/S4, no murmur.  No peripheral edema.   Abdomen: Soft, nontender, no hepatosplenomegaly, no distention.  Skin: Intact without lesions or rashes.  Neurologic: Alert and oriented x 3.  Psych: Normal affect. Extremities: No clubbing or cyanosis.  HEENT: Normal.    Telemetry   ?NSR 100s (personally reviewed)  EKG    No new EKG to review   Labs    CBC Recent Labs    03/26/21 1724 03/27/21 0009 03/27/21 0509  WBC 18.9*  --  22.1*  HGB 9.7*  --  9.9*  HCT 30.3*  --  31.1*  MCV 94.1  --  95.7  PLT 53*  50* 51* 53*  51*   Basic Metabolic Panel Recent Labs    03/26/21 0442 03/26/21 1632 03/27/21 0509  NA 134* 137 135  K 4.7 4.6 4.6  CL 101 103 100  CO2 '26 28 25  '$ GLUCOSE 162* 100* 153*  BUN '11 11 14  '$ CREATININE 1.74* 1.65* 1.74*  CALCIUM 8.0* 8.1* 8.0*  MG 2.2  --  2.2  PHOS 3.5 3.2  --    Liver Function Tests Recent Labs    03/26/21  [id number] 03/26/21 1632 03/27/21 0509  AST 22  --  26  ALT 8  --  6  ALKPHOS 56  --  82  BILITOT 4.0*  --  5.8*  PROT 5.3*  --  5.5*  ALBUMIN 3.2* 3.1* 3.0*   No results for input(s): LIPASE, AMYLASE in the last 72 hours. Cardiac Enzymes No results for input(s): CKTOTAL, CKMB, CKMBINDEX, TROPONINI in the last 72 hours.  BNP: BNP (last 3 results) Recent Labs    02/24/2021 1545  BNP 1,127.8*    ProBNP (last 3 results) No results for input(s): PROBNP in the last 8760 hours.   D-Dimer Recent Labs    03/27/21 0009 03/27/21 0509  DDIMER 3.36* 4.77*   Hemoglobin A1C No results for input(s): HGBA1C in the last 72 hours. Fasting Lipid Panel Recent Labs    03/25/21 0442  TRIG 84   Thyroid Function Tests No results for input(s): TSH, T4TOTAL, T3FREE, THYROIDAB in the last 72 hours.  Invalid input(s): FREET3  Other results:   Imaging    No results  found.   Medications:     Scheduled Medications: . sodium chloride   Intravenous Once  . sodium chloride   Intravenous Once  . sodium chloride   Intravenous Once  . sodium chloride   Intravenous Once  . acetaminophen  1,000 mg Oral Q6H   Or  . acetaminophen (TYLENOL) oral liquid 160 mg/5 mL  1,000 mg Per Tube Q6H  . aspirin EC  81 mg Oral Daily  . atorvastatin  40 mg Oral Daily  . B-complex with vitamin C  1 tablet Oral Daily  . bisacodyl  10 mg Oral Daily   Or  . bisacodyl  10 mg Rectal Daily  . Chlorhexidine Gluconate Cloth  6 each Topical Daily  . cinacalcet  30 mg Oral Q T,Th,Sa-HD  . [START ON 04/01/2021] darbepoetin (ARANESP) injection - NON-DIALYSIS  60 mcg Subcutaneous Q Thu-1800  . docusate sodium  200 mg Oral Daily  . feeding supplement (PROSource TF)  45 mL Per Tube QID  . insulin aspart  0-24 Units Subcutaneous Q4H  . mouth rinse  15 mL Mouth Rinse BID  . pantoprazole  40 mg Oral Daily  . sildenafil  20 mg Oral TID  . sodium chloride flush  10-40 mL Intracatheter Q12H  . sodium chloride flush  3 mL Intravenous Q12H    Infusions: .  prismasol BGK 4/2.5 400 mL/hr at 03/27/21 0239  .  prismasol BGK 4/2.5 200 mL/hr at 03/26/21 1359  . sodium chloride    . sodium chloride Stopped (03/26/21 1410)  . amiodarone 30 mg/hr (03/27/21 0800)  . epinephrine Stopped (03/27/21 0216)  . feeding supplement (VITAL 1.5 CAL) 50 mL/hr at 03/27/21 0800  . lactated ringers    . lactated ringers    . lactated ringers 20 mL/hr at 03/27/21 0800  . milrinone 0.125 mcg/kg/min (03/27/21 0800)  . norepinephrine (LEVOPHED) Adult infusion 14 mcg/min (03/27/21 0800)  . phenylephrine (NEO-SYNEPHRINE) Adult infusion Stopped (04/10/2021 0504)  . prismasol BGK 4/2.5 1,500 mL/hr at 03/27/21 [id number]    PRN Medications: heparin, lactated ringers, lidocaine (PF), lip balm, metoprolol tartrate, morphine injection, ondansetron (ZOFRAN) IV, oxyCODONE, sodium chloride, sodium chloride flush, sodium  chloride flush, traMADol     Assessment/Plan   1. CAD: Severe 3VD with decreased EF.  I reviewed films with Dr. Ellyn Hack, PCI would be possible but would be difficult/high risk with multiple lesions and heavy calcification. 5/31 LIMA-LAD, SVG-RCA, SVG-OM, SVG-D. -  Continue ASA 81.  - Continue atorvastatin 40 mg daily.  2. Acute/chronic HF with mid range EF => cardiogenic shock post-CABG: Suspect ischemic cardiomyopathy.  Echo 4/22 with EF 40-45% with moderately decreased RV systolic function and PASP 99, moderate TR. There was a prominent component of RV failure with RA pressure elevated out of proportion to PCWP (CVP/PCWP 0.875 on RHC) but PAPI adequate at 2.8. Post-op had PEA arrest then VT. Currently on NE 14, milrinone 0.125 with SBP 150s.  CO-OX 75%, CI 5, PA pressure still moderately elevated, CVP 12.  Weight still considerably up from baseline.  - Leave milrinone at 0.125 to help with elevated PA pressure.  - Hopefully can wean NE down/off today.  - Would run CVVH for 1 more day for volume removal, aim for net negative 50-100 cc/hr.  - CT drainage slowing.   3. ESRD: Suspected due to diabetes.  Volume up post-op with multiple blood products. - CVVH run 50-100 cc/hr net negative UF today. Hopefully can stop CVVH after today.  4. 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  The CO was not markedly high.  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 Oxygen saturation was 99% on RA pre-op, so no evidence for hepatopulmonary syndrome.   Moderate PH currently.  - Keep milrinone at 0.125 today.   - Add back sildenafil 20 mg tid with improved MAP and titrating off pressors.    5. Cirrhosis: Noted on abdominal US from 2020.  H/o paracentesis.  Had workup at [facility] (though patient does not remember this), viral hepatitis labs negative.  They ended up think that the cirrhosis was cardiogenic.   Repeat abdominal US 5/23 w/ nodular hepatic parenchymal pattern with increased echogenicity consistent with the patient's known cirrhosis. No focal hepatic abnormality identified. Portal vein is patent. Moderate ascites. GI has seen, patient had 4L paracentesis on 5/26. 6. Coagulopathy: Post-op bleeding in setting of coagulopathy post-CABG in patient with cirrhosis.  Currently seems controlled.  Platelets dropped, HIT sent  - Off heparin pending HIT.  7. VT: Had after PEA arrest am 6/1, continue amiodarone gtt 30.  8. Anemia: ABLA.  Stable today hgb 9.9. Follow CBC daily.   9. Pleural effusions: Right effusion with chest tube, ?hepatic hydrothorax.  CT drainage slowing.     10. PEA arrest: Respiratory arrest post-extubation.  ROSC with CPR.  11. Acute hypoxemic respiratory failure:Extubated 6/2  12. Atrial fibrillation: Noted post-op, now appears to be in probably NSR 100s.   - Continue amio drip.  - HIT panel pending so off heparin gtt.    PT consult, will be able to do more when off CVVH (hope tomorrow).    CRITICAL CARE Performed by: Loralie Champagne  Total critical care time: 35 minutes  Critical care time was exclusive of separately billable procedures and treating other patients.  Critical care was necessary to treat or prevent imminent or life-threatening deterioration.  Critical care was time spent personally by me on the following activities: development of treatment plan with patient and/or surrogate as well as nursing, discussions with consultants, evaluation of patient's response to treatment, examination of patient, obtaining history from patient or surrogate, ordering and performing treatments and interventions, ordering and review of laboratory studies, ordering and review of radiographic studies, pulse oximetry and re-evaluation of patient's condition.  Loralie Champagne 03/27/2021 8:20 AM

## 2021-03-27 NOTE — Evaluation (Addendum)
Physical Therapy Evaluation Patient Details Name: David Rose. MRN: [id number] DOB: 06/27/1966 Today's Date: 03/27/2021   History of Present Illness  55 yo admitted 5/23 for heart failure optimization prior to CABGx 4 performed on 5/31. 5/26 paracentesis. Pt extubated 6/1 but later had PEA arrest with intubation and return to OR for exploration with chest tube placed and extubated 6/2. CRRT started 6/1. PMhx: CAD, ESRD, pulmonary HTN, CHF, anemia, cirrhosis, DM, finger amputation  Clinical Impression  Therapy order post extubation with pt with maintained Swan at present with order per Dr.Mclean for swan to be disconnected from monitoring for mobility. Pt able to perform rolling and limited transition to EOB with maintained limited hip flexion and back to supine. Pt educated for limited HEP at present and sternal precautions. Anticipate pt will progress well once able to perform full hip ROM and disconnected from lines. Pt with decreased strength, transfers, function and mobility who will benefit from acute therapy to maximize mobility and safety within precautions.   HR 106 SpO2 99% on 3L    Follow Up Recommendations CIR (pending progression with medical stability)    Equipment Recommendations  Other (comment) (TBD)    Recommendations for Other Services       Precautions / Restrictions Precautions Precautions: Fall;Sternal;Other (comment) Precaution Booklet Issued: Yes (comment) Precaution Comments: CRRT left fem, Swan, external pacer, chest tubes, Restrictions Weight Bearing Restrictions: Yes      Mobility  Bed Mobility Overal bed mobility: Needs Assistance Bed Mobility: Rolling;Supine to Sit Rolling: Mod assist;+2 for physical assistance   Supine to sit: Max assist;+2 for physical assistance;+2 for safety/equipment;HOB elevated     General bed mobility comments: physical assist to roll with cues for sequence and precautions with use of pad and pt holding pillow. Pivot  from supine to sit with posterior lean maintained and use of pad. partial EOb grossly 1 min with good CRRT flow and return to supine with max +2. Total +2 to slide up in bed. RN present throughout to monitor and manage lines    Transfers                 General transfer comment: unable at this time  Ambulation/Gait                Stairs            Wheelchair Mobility    Modified Rankin (Stroke Patients Only)       Balance Overall balance assessment:  (unable to assess due to lack of ability for unsupported sitting with restrictions)                                           Pertinent Vitals/Pain Pain Assessment: Faces Pain Score: 4  Pain Location: incisional Pain Descriptors / Indicators: Guarding Pain Intervention(s): Limited activity within patient's tolerance;Monitored during session;Repositioned    Home Living Family/patient expects to be discharged to:: Private residence Living Arrangements: Alone Available Help at Discharge: Family;Available 24 hours/day Type of Home: House Home Access: Stairs to enter   [employer] of Steps: 1 Home Layout: One level Home Equipment: Cane - single point      Prior Function Level of Independence: Independent with assistive device(s)         Comments: walks with cane. independent in ADLs and 50% iADLs     Hand Dominance  Extremity/Trunk Assessment   Upper Extremity Assessment Upper Extremity Assessment: Defer to OT evaluation    Lower Extremity Assessment Lower Extremity Assessment: LLE deficits/detail LLE Deficits / Details: limited by fem cath, appears grossly functional for quad strength within ROM    Cervical / Trunk Assessment Cervical / Trunk Assessment:  (unable to fully assess due to lack of ability for full sitting)  Communication   Communication: No difficulties  Cognition Arousal/Alertness: Awake/alert Behavior During Therapy: WFL for tasks  assessed/performed Overall Cognitive Status: Within Functional Limits for tasks assessed                                        General Comments      Exercises General Exercises - Lower Extremity Short Arc Quad: AROM;Both;10 reps;Supine   Assessment/Plan    PT Assessment Patient needs continued PT services  PT Problem List Decreased mobility;Decreased activity tolerance;Decreased balance;Decreased knowledge of use of DME;Cardiopulmonary status limiting activity       PT Treatment Interventions DME instruction;Gait training;Functional mobility training;Therapeutic activities;Patient/family education;Balance training;Therapeutic exercise;Stair training    PT Goals (Current goals can be found in the Care Plan section)  Acute Rehab PT Goals Patient Stated Goal: return home PT Goal Formulation: With patient/family Time For Goal Achievement: 04/10/21 Potential to Achieve Goals: Fair    Frequency Min 3X/week   Barriers to discharge        Co-evaluation PT/OT/SLP Co-Evaluation/Treatment: Yes Reason for Co-Treatment: Complexity of the patient's impairments (multi-system involvement) PT goals addressed during session: Mobility/safety with mobility         AM-PAC PT "6 Clicks" Mobility  Outcome Measure Help needed turning from your back to your side while in a flat bed without using bedrails?: Total Help needed moving from lying on your back to sitting on the side of a flat bed without using bedrails?: Total Help needed moving to and from a bed to a chair (including a wheelchair)?: Total Help needed standing up from a chair using your arms (e.g., wheelchair or bedside chair)?: Total Help needed to walk in hospital room?: Total Help needed climbing 3-5 steps with a railing? : Total 6 Click Score: 6    End of Session   Activity Tolerance: Patient tolerated treatment well Patient left: in bed;with call bell/phone within reach;with nursing/sitter in room;with  family/visitor present Nurse Communication: Mobility status;Precautions PT Visit Diagnosis: Other abnormalities of gait and mobility (R26.89);Muscle weakness (generalized) (M62.81);Difficulty in walking, not elsewhere classified (R26.2)    Time: [id number] PT Time Calculation (min) (ACUTE ONLY): 16 min   Charges:   PT Evaluation $PT Eval High Complexity: 1 High          Shenise Wolgamott P, PT Acute Rehabilitation Services Pager: [phone] Office: [phone]   Rowland Ericsson B Saydi Kobel 03/27/2021, 11:11 AM

## 2021-03-27 NOTE — Progress Notes (Signed)
NAME:  David Rose., MRN:  [id number], DOB:  1965-11-08, LOS: 12 ADMISSION DATE:  03/20/2021, CONSULTATION DATE:  04/20/2021 REFERRING MD:  Verl Blalock, CHIEF COMPLAINT:  code   History of Present Illness:  55 year old man w/ hx of DM2, HTN, PAD, ESRD, cirrhosis (? Cardiac) who presented for symptomatic ischemic cardiomyopathy.  Underwent pre-op optimization with CHF, GI, and nephrology teams then high risk CABG 5/31. On  5/31-6/1 Mr. Burel unfortunately developed respiratory distress followed by PEA and chest compressions. This caused increased bleeding around surgical sites, underwent mediastinal exploration this AM with some bleeding around mammary graft site and diffuse oozing.  Received multiple units of blood products overnight.  He is intubated, on pressors.  PCCM asked to assist with management.  Pertinent  Medical History  ESRD on HD Cardiac cirrhosis with portal hypertension IBS IDDM  Significant Hospital Events: Including procedures, antibiotic start and stop dates in addition to other pertinent events   . 5/23 admitted . 5/26 paracentesis 4L . 5/30 HD . 5/31 CABG . 5/31-6/1 code, OR for mediastinal exploration 2/2 hemorrhagic shock induced by CPR. Factor 7, protamine, FFP, cryo given . 6/1 PCCM consult, 4u prbc and 1 plt . R pigtail chest tube placed due to limitation of vent weaning. . 6/2 extubated, chest tube 3.2L out past 24 hrs . 6/3 remains on 2L [place], chest tube with 500cc out/ 24 hrs . 6/4   Interim History / Subjective:  Denies complaints  Objective   Blood pressure (!) 123/46, pulse (!) 107, temperature 98.06 F (36.7 C), resp. rate (!) 25, height '6\' 2"'$  (1.88 m), weight 80.5 kg, SpO2 100 %. 4LNC PAP: (52-70)/(17-26) 53/19 CVP:  [6 mmHg-14 mmHg] 11 mmHg CO:  [9.6 L/min-10.6 L/min] 10 L/min CI:  [4.9 L/min/m2-5.4 L/min/m2] 5 L/min/m2      Intake/Output Summary (Last 24 hours) at 03/27/2021 1056 Last data filed at 03/27/2021 1000 Gross per 24 hour  Intake 3021.26 ml   Output 3278 ml  Net -256.74 ml   Filed Weights   03/25/21 0600 03/26/21 0500 03/27/21 0715  Weight: 80.9 kg 80.9 kg 80.5 kg    Examination: General: chronically ill appearing man lying in bed in NAD HEENT: [place]/AT, eyes anicteric Neck: RIJ introducer with Swan Neuro: awake, alert, moving all extremities, answering questions appropriately CV: S1S2, precordial heave bilaterally PULM:   CTAB anteriorly, R chest tube with minimal output, bilateral large bore chest tubes  GI: soft, NT Extremities: warm, mild ongoing dependent edema Skin: warm, dry, pallor  Labs/imaging that I havepersonally reviewed  (right click and "Reselect all SmartList Selections" daily)  coox 75% Bicarb 25 BUN 14 Cr 1.74 WBC 22.1 H/H 9.9/31.1 Platelets 51 D-dimer 4.77 INR 1.5 Fibrinogen 466 CXR> accumulating left sided effusion, small apicolateral pneumothorax  Resolved Hospital Problem list   Acute Encephalopathy  Assessment & Plan:   Acute hypoxemic respiratory failure- improved Bilateral pleural effusions; 2 surgical chest tubes and inferior R sided pigtail tube in place -con't chest tubes to suction -wean O2 as able -mobilize as able; limited vascular access requiring femoral HD catheter  Post-op mediastinal hemorrhage Cirrhotic and hemorrhage- induced consumptive coagulopathy Hemorrhagic shock, resolved Acute blood loss anemia Thrombocytopenia- in setting of blood loss, coagulopathy, and cirrhosis HGB stable 9.7>9.8, platelets very slowly recovering -con't to monitor -transfuse to maintain Hb >7 -HIT panel pending -daily INR  In hospital cardiac arrest precipitated by respiratory distress- PEA, minimal downtime. CAD post CABG Cardiac Cirrhosis with ascites Hx HTN VT- On 6/1 -  Con't daily ASA and atorvastatin -Continue amio drip for A-fib -AC held in the setting of bleeding and ongoing thrombocytopenia -HF weaning vasopressors. Vaso off. Continue levophed. Goal MAP greater than 65.  Titrate to goal. -Con't inotropes per cardiology  ESRD on HD PTA -Appreciate Neprology's assistance -Continue CRRT -strict I/Os -renally dose meds  DM2 -Continue SSI PRN -BG 140-180  Best practice (right click and "Reselect all SmartList Selections" daily)  Diet:  Oral Pain/Anxiety/Delirium protocol (if indicated): No VAP protocol (if indicated): Not indicated DVT prophylaxis: SCD and Contraindicated SCD GI prophylaxis: PPI Glucose control:  SSI Yes Central venous access:  Yes, and it is still needed Arterial line:  Yes, and it is still needed Foley:  N/A Mobility:  bed rest  PT consulted: Yes Last date of multidisciplinary goals of care discussion [patient and wife updated on rounds] Code Status:  full code Disposition: per primary   This patient is critically ill with multiple organ system failure which requires frequent high complexity decision making, assessment, support, evaluation, and titration of therapies. This was completed through the application of advanced monitoring technologies and extensive interpretation of multiple databases. During this encounter critical care time was devoted to patient care services described in this note for 32 minutes.  Julian Hy, DO 03/27/21 12:18 PM [facility]

## 2021-03-27 NOTE — Evaluation (Signed)
Occupational Therapy Evaluation Patient Details Name: David Rose. MRN: [id number] DOB: [birth year] Today's Date: 03/27/2021    History of Present Illness 55 yo admitted 5/23 for heart failure optimization prior to CABGx 4 performed on 5/31. 5/26 paracentesis. Pt extubated 6/1 but later had PEA arrest with intubation and return to OR for exploration with chest tube placed and extubated 6/2. CRRT started 6/1. PMhx: CAD, ESRD, pulmonary HTN, CHF, anemia, cirrhosis, DM, finger amputation   Clinical Impression   Pt is typically mod I with SPC for mobility and ADL - lives with sister and shares the IADL with her, typically enjoys fishing. Therapy order post extubation with pt with maintained Swan at present with order per Dr.Mclean for swan to be disconnected from monitoring for mobility with therapy. Today is limited by decreased activity tolerance, decreased balance, generalized weakness. Pt was able to perform rolling with max A and sit EOB maintaining intentional posterior lean (with OT supporting behind with pillow) for BLE exercises. Pt able to demonstrate ability to bring hands to face for grooming- but hands/grasp does fatigue with sustained activity (like holding a cup of water). Pt provided with sternal handout and education initiated for "move in the tube" with focus on bed mobility and ADL. Pt very pleasant and motivated and will require continued OT in the acute setting as well as CIR level post-acute to maximize safety and independence in ADL and functional transfers.    Follow Up Recommendations  CIR (pending medical stability and progress)    Equipment Recommendations  Other (comment) (defer to next venue of care)    Recommendations for Other Services Rehab consult     Precautions / Restrictions Precautions Precautions: Fall;Sternal;Other (comment) Precaution Booklet Issued: Yes (comment) Precaution Comments: CRRT left fem, Swan, external pacer, chest  tubes, Restrictions Weight Bearing Restrictions: Yes Other Position/Activity Restrictions: Sternal Precautions      Mobility Bed Mobility Overal bed mobility: Needs Assistance Bed Mobility: Rolling;Supine to Sit Rolling: Mod assist;+2 for physical assistance   Supine to sit: Max assist;+2 for physical assistance;+2 for safety/equipment;HOB elevated     General bed mobility comments: physical assist to roll with cues for sequence and precautions with use of pad and pt holding pillow. Pivot from supine to sit with posterior lean maintained and use of pad. partial EOb grossly 1 min with good CRRT flow and return to supine with max +2. Total +2 to slide up in bed. RN present throughout to monitor and manage lines    Transfers                 General transfer comment: unable at this time    Balance Overall balance assessment:  (unable to assess due to lack of ability for unsupported sitting with restrictions)                                         ADL either performed or assessed with clinical judgement   ADL Overall ADL's : Needs assistance/impaired Eating/Feeding: NPO   Grooming: Wash/dry face;Set up;Bed level Grooming Details (indicate cue type and reason): initiated sternal precaution education, Pt limited by lines, but able to bring hands to face for basic grooming Upper Body Bathing: Moderate assistance   Lower Body Bathing: Total assistance   Upper Body Dressing : Maximal assistance   Lower Body Dressing: Total assistance;Bed level     Toilet Transfer Details (indicate cue  type and reason): unable to attempt at this time Toileting- Water quality scientist and Hygiene: Total assistance;Bed level       Functional mobility during ADLs:  (NT today) General ADL Comments: generalized weakness, decreased balance, decreased activity tolerance, motivated to maximize independence     Vision         Perception     Praxis      Pertinent  Vitals/Pain Pain Assessment: Faces Pain Score: 4  Faces Pain Scale: Hurts little more Pain Location: incisional Pain Descriptors / Indicators: Guarding Pain Intervention(s): Limited activity within patient's tolerance;Monitored during session;Repositioned     Hand Dominance     Extremity/Trunk Assessment Upper Extremity Assessment Upper Extremity Assessment: Generalized weakness (grossly 3/5 - fatigues with prolongued task (like holding a full cup of liquid))   Lower Extremity Assessment Lower Extremity Assessment: LLE deficits/detail LLE Deficits / Details: limited by fem cath, appears grossly functional for quad strength within ROM   Cervical / Trunk Assessment Cervical / Trunk Assessment:  (unable to fully assess due to lack of ability for full sitting)   Communication Communication Communication: No difficulties   Cognition Arousal/Alertness: Awake/alert Behavior During Therapy: WFL for tasks assessed/performed Overall Cognitive Status: Within Functional Limits for tasks assessed                                     General Comments  RN present throughout session, Sister in room throughout session.    Exercises Exercises: Other exercises General Exercises - Lower Extremity Short Arc Quad: AROM;Both;10 reps;Supine Other Exercises Other Exercises: hand AROM for digits (make a fist and open back up) to address edema and decreased activity tolerance - educated that he can use a rolled up wash cloth in lieu of a squeeze ball   Shoulder Instructions      Home Living Family/patient expects to be discharged to:: Private residence Living Arrangements: Other relatives (sister lives with him; different sister will be staying with him post- d/c) Available Help at Discharge: Family;Available 24 hours/day Type of Home: House Home Access: Stairs to enter [employer] of Steps: 1   Home Layout: One level     Bathroom Shower/Tub: Animal nutritionist: Standard     Home Equipment: Kasandra Knudsen - single point   Additional Comments: enjoys fishing      Prior Functioning/Environment Level of Independence: Independent with assistive device(s)        Comments: walks with cane. independent in ADLs and 50% iADLs        OT Problem List: Decreased strength;Decreased activity tolerance;Impaired balance (sitting and/or standing);Decreased safety awareness;Decreased knowledge of use of DME or AE;Decreased knowledge of precautions;Cardiopulmonary status limiting activity;Impaired UE functional use;Pain;Increased edema      OT Treatment/Interventions: Self-care/ADL training;Therapeutic exercise;Energy conservation;DME and/or AE instruction;Therapeutic activities;Patient/family education;Balance training    OT Goals(Current goals can be found in the care plan section) Acute Rehab OT Goals Patient Stated Goal: return home and be independent OT Goal Formulation: With patient/family Time For Goal Achievement: 04/10/21 Potential to Achieve Goals: Good  OT Frequency: Min 2X/week   Barriers to D/C:            Co-evaluation PT/OT/SLP Co-Evaluation/Treatment: Yes Reason for Co-Treatment: Complexity of the patient's impairments (multi-system involvement);For patient/therapist safety PT goals addressed during session: Mobility/safety with mobility;Balance;Strengthening/ROM OT goals addressed during session: ADL's and self-care;Strengthening/ROM      AM-PAC OT "6 Clicks" Daily Activity  Outcome Measure Help from another person eating meals?: Total (NPO) Help from another person taking care of personal grooming?: A Little Help from another person toileting, which includes using toliet, bedpan, or urinal?: Total Help from another person bathing (including washing, rinsing, drying)?: A Lot Help from another person to put on and taking off regular upper body clothing?: A Lot Help from another person to put on and taking off regular  lower body clothing?: Total 6 Click Score: 10   End of Session Equipment Utilized During Treatment: Oxygen (3L) Nurse Communication: Mobility status;Precautions  Activity Tolerance: Patient tolerated treatment well Patient left: in bed;with call bell/phone within reach;with bed alarm set;with nursing/sitter in room;with family/visitor present  OT Visit Diagnosis: Unsteadiness on feet (R26.81);Other abnormalities of gait and mobility (R26.89);Muscle weakness (generalized) (M62.81)                Time: [id number] OT Time Calculation (min): 18 min Charges:  OT General Charges $OT Visit: 1 Visit OT Evaluation $OT Eval High Complexity: 1 High  Jesse Sans OTR/L Acute Rehabilitation Services Pager: [phone] Office: [place] 03/27/2021, 12:46 PM

## 2021-03-27 NOTE — Progress Notes (Signed)
Spoke with Dr. Carlis Abbott about patient's CT output after PT and was told it was to be expected as patient has a left pleural effusion.

## 2021-03-28 ENCOUNTER — Inpatient Hospital Stay (HOSPITAL_COMMUNITY): Payer: Medicare Other

## 2021-03-28 DIAGNOSIS — J9601 Acute respiratory failure with hypoxia: Secondary | ICD-10-CM | POA: Diagnosis not present

## 2021-03-28 DIAGNOSIS — J189 Pneumonia, unspecified organism: Secondary | ICD-10-CM

## 2021-03-28 DIAGNOSIS — I5023 Acute on chronic systolic (congestive) heart failure: Secondary | ICD-10-CM | POA: Diagnosis not present

## 2021-03-28 DIAGNOSIS — D62 Acute posthemorrhagic anemia: Secondary | ICD-10-CM

## 2021-03-28 DIAGNOSIS — I270 Primary pulmonary hypertension: Secondary | ICD-10-CM

## 2021-03-28 DIAGNOSIS — R739 Hyperglycemia, unspecified: Secondary | ICD-10-CM

## 2021-03-28 LAB — COMPREHENSIVE METABOLIC PANEL
ALT: 10 U/L (ref 0–44)
AST: 31 U/L (ref 15–41)
Albumin: 2.6 g/dL — ABNORMAL LOW (ref 3.5–5.0)
Alkaline Phosphatase: 114 U/L (ref 38–126)
Anion gap: 5 (ref 5–15)
BUN: 17 mg/dL (ref 6–20)
CO2: 26 mmol/L (ref 22–32)
Calcium: 7.7 mg/dL — ABNORMAL LOW (ref 8.9–10.3)
Chloride: 103 mmol/L (ref 98–111)
Creatinine, Ser: 1.6 mg/dL — ABNORMAL HIGH (ref 0.61–1.24)
GFR, Estimated: 51 mL/min — ABNORMAL LOW (ref >60)
Glucose, Bld: 165 mg/dL — ABNORMAL HIGH (ref 70–99)
Potassium: 4.7 mmol/L (ref 3.5–5.1)
Sodium: 134 mmol/L — ABNORMAL LOW (ref 135–145)
Total Bilirubin: 5 mg/dL — ABNORMAL HIGH (ref 0.3–1.2)
Total Protein: 5.5 g/dL — ABNORMAL LOW (ref 6.5–8.1)

## 2021-03-28 LAB — DIC (DISSEMINATED INTRAVASCULAR COAGULATION)PANEL
D-Dimer, Quant: 10.04 ug/mL-FEU — ABNORMAL HIGH (ref 0.00–0.50)
D-Dimer, Quant: 7.18 ug/mL-FEU — ABNORMAL HIGH (ref 0.00–0.50)
D-Dimer, Quant: 8.11 ug/mL-FEU — ABNORMAL HIGH (ref 0.00–0.50)
D-Dimer, Quant: 9.26 ug/mL-FEU — ABNORMAL HIGH (ref 0.00–0.50)
Fibrinogen: 482 mg/dL — ABNORMAL HIGH (ref 210–475)
Fibrinogen: 484 mg/dL — ABNORMAL HIGH (ref 210–475)
Fibrinogen: 517 mg/dL — ABNORMAL HIGH (ref 210–475)
Fibrinogen: 525 mg/dL — ABNORMAL HIGH (ref 210–475)
INR: 1.3 — ABNORMAL HIGH (ref 0.8–1.2)
INR: 1.4 — ABNORMAL HIGH (ref 0.8–1.2)
INR: 1.4 — ABNORMAL HIGH (ref 0.8–1.2)
INR: 1.4 — ABNORMAL HIGH (ref 0.8–1.2)
Platelets: 65 10*3/uL — ABNORMAL LOW (ref 150–400)
Platelets: 67 10*3/uL — ABNORMAL LOW (ref 150–400)
Platelets: 68 10*3/uL — ABNORMAL LOW (ref 150–400)
Platelets: 70 10*3/uL — ABNORMAL LOW (ref 150–400)
Prothrombin Time: 16.5 seconds — ABNORMAL HIGH (ref 11.4–15.2)
Prothrombin Time: 16.8 seconds — ABNORMAL HIGH (ref 11.4–15.2)
Prothrombin Time: 16.8 seconds — ABNORMAL HIGH (ref 11.4–15.2)
Prothrombin Time: 17.1 seconds — ABNORMAL HIGH (ref 11.4–15.2)
Smear Review: NONE SEEN
Smear Review: NONE SEEN
Smear Review: NONE SEEN
Smear Review: NONE SEEN
aPTT: 43 seconds — ABNORMAL HIGH (ref 24–36)
aPTT: 44 seconds — ABNORMAL HIGH (ref 24–36)
aPTT: 44 seconds — ABNORMAL HIGH (ref 24–36)
aPTT: 45 seconds — ABNORMAL HIGH (ref 24–36)

## 2021-03-28 LAB — RENAL FUNCTION PANEL
Albumin: 3 g/dL — ABNORMAL LOW (ref 3.5–5.0)
Anion gap: 7 (ref 5–15)
BUN: 18 mg/dL (ref 6–20)
CO2: 26 mmol/L (ref 22–32)
Calcium: 7.4 mg/dL — ABNORMAL LOW (ref 8.9–10.3)
Chloride: 99 mmol/L (ref 98–111)
Creatinine, Ser: 1.48 mg/dL — ABNORMAL HIGH (ref 0.61–1.24)
GFR, Estimated: 56 mL/min — ABNORMAL LOW (ref >60)
Glucose, Bld: 174 mg/dL — ABNORMAL HIGH (ref 70–99)
Phosphorus: 2.8 mg/dL (ref 2.5–4.6)
Potassium: 5.2 mmol/L — ABNORMAL HIGH (ref 3.5–5.1)
Sodium: 132 mmol/L — ABNORMAL LOW (ref 135–145)

## 2021-03-28 LAB — GLUCOSE, CAPILLARY
Glucose-Capillary: 118 mg/dL — ABNORMAL HIGH (ref 70–99)
Glucose-Capillary: 132 mg/dL — ABNORMAL HIGH (ref 70–99)
Glucose-Capillary: 163 mg/dL — ABNORMAL HIGH (ref 70–99)
Glucose-Capillary: 163 mg/dL — ABNORMAL HIGH (ref 70–99)
Glucose-Capillary: 168 mg/dL — ABNORMAL HIGH (ref 70–99)
Glucose-Capillary: 94 mg/dL (ref 70–99)
Glucose-Capillary: 98 mg/dL (ref 70–99)

## 2021-03-28 LAB — COOXEMETRY PANEL
Carboxyhemoglobin: 2 % — ABNORMAL HIGH (ref 0.5–1.5)
Carboxyhemoglobin: 2.1 % — ABNORMAL HIGH (ref 0.5–1.5)
Methemoglobin: 1 % (ref 0.0–1.5)
Methemoglobin: 1 % (ref 0.0–1.5)
O2 Saturation: 87.2 %
O2 Saturation: 84.4 %
Total hemoglobin: 9.9 g/dL — ABNORMAL LOW (ref 12.0–16.0)
Total hemoglobin: 10.5 g/dL — ABNORMAL LOW (ref 12.0–16.0)

## 2021-03-28 LAB — MRSA PCR SCREENING: MRSA by PCR: NEGATIVE

## 2021-03-28 LAB — CBC
HCT: 30.4 % — ABNORMAL LOW (ref 39.0–52.0)
HCT: 29.1 % — ABNORMAL LOW (ref 39.0–52.0)
Hemoglobin: 9.6 g/dL — ABNORMAL LOW (ref 13.0–17.0)
Hemoglobin: 9.2 g/dL — ABNORMAL LOW (ref 13.0–17.0)
MCH: 30.4 pg (ref 26.0–34.0)
MCH: 30.6 pg (ref 26.0–34.0)
MCHC: 31.6 g/dL (ref 30.0–36.0)
MCHC: 31.6 g/dL (ref 30.0–36.0)
MCV: 96.2 fL (ref 80.0–100.0)
MCV: 96.7 fL (ref 80.0–100.0)
Platelets: 66 10*3/uL — ABNORMAL LOW (ref 150–400)
Platelets: 71 10*3/uL — ABNORMAL LOW (ref 150–400)
RBC: 3.16 MIL/uL — ABNORMAL LOW (ref 4.22–5.81)
RBC: 3.01 MIL/uL — ABNORMAL LOW (ref 4.22–5.81)
RDW: 19.3 % — ABNORMAL HIGH (ref 11.5–15.5)
RDW: 19.3 % — ABNORMAL HIGH (ref 11.5–15.5)
WBC: 22 10*3/uL — ABNORMAL HIGH (ref 4.0–10.5)
WBC: 18.9 10*3/uL — ABNORMAL HIGH (ref 4.0–10.5)
nRBC: 0 % (ref 0.0–0.2)
nRBC: 0 % (ref 0.0–0.2)

## 2021-03-28 LAB — FIBRINOGEN: Fibrinogen: 656 mg/dL — ABNORMAL HIGH (ref 210–475)

## 2021-03-28 LAB — PROTIME-INR
INR: 1.4 — ABNORMAL HIGH (ref 0.8–1.2)
Prothrombin Time: 16.8 seconds — ABNORMAL HIGH (ref 11.4–15.2)

## 2021-03-28 LAB — MAGNESIUM: Magnesium: 2.2 mg/dL (ref 1.7–2.4)

## 2021-03-28 LAB — TRIGLYCERIDES: Triglycerides: 54 mg/dL (ref <150)

## 2021-03-28 LAB — PHOSPHORUS: Phosphorus: 1.9 mg/dL — ABNORMAL LOW (ref 2.5–4.6)

## 2021-03-28 MED ORDER — SODIUM CHLORIDE 0.9 % IV SOLN
2.0000 g | Freq: Two times a day (BID) | INTRAVENOUS | Status: DC
  Administered 2021-03-28 – 2021-03-30 (×5): 2 g via INTRAVENOUS
  Filled 2021-03-28 (×5): qty 2

## 2021-03-28 MED ORDER — ALBUMIN HUMAN 25 % IV SOLN
50.0000 g | Freq: Once | INTRAVENOUS | Status: AC
  Filled 2021-03-28: qty 200

## 2021-03-28 MED ORDER — POTASSIUM PHOSPHATES 15 MMOLE/5ML IV SOLN
30.0000 mmol | Freq: Once | INTRAVENOUS | Status: AC
  Administered 2021-03-28: 30 mmol via INTRAVENOUS
  Filled 2021-03-28: qty 10

## 2021-03-28 MED ORDER — VANCOMYCIN HCL 1500 MG/300ML IV SOLN
1500.0000 mg | Freq: Once | INTRAVENOUS | Status: AC
  Administered 2021-03-28: 1500 mg via INTRAVENOUS
  Filled 2021-03-28: qty 300

## 2021-03-28 MED ORDER — VANCOMYCIN HCL 1000 MG/200ML IV SOLN: 1000.0000 mg | INTRAVENOUS | Status: DC

## 2021-03-28 MED ORDER — POLYETHYLENE GLYCOL 3350 17 G PO PACK
17.0000 g | PACK | Freq: Every day | ORAL | Status: DC
  Administered 2021-03-28 – 2021-03-30 (×4): 17 g via ORAL
  Filled 2021-03-28 (×4): qty 1

## 2021-03-28 NOTE — Progress Notes (Addendum)
Cortrak removed per Dr. Kipp Brood. Tube feeding orders dc'd.

## 2021-03-28 NOTE — Progress Notes (Signed)
Pharmacy Antibiotic Note  David Rose. is a 55 y.o. male admitted on 03/11/2021 now with concern for PNA.  Pharmacy has been consulted for Vancomycin and Cefepime dosing. CXR with airspace opacity consistent with PNA on left with small L pleural effusion. WBC elevated 22, afebrile. Remains on CRRT with potential for stopping CRRT tomorrow.  Plan: Vancomycin 1500 mg IV once, then '1000mg'$  IV q24 hrs Cefepime 2 gm IV q12 hrs Monitor CRRT duration, cultures/sensitivities, clinical progression   Height: '6\' 2"'$  (188 cm) Weight: 80.5 kg (177 lb 7.5 oz) IBW/kg (Calculated) : 82.2  Temp (24hrs), Avg:98 F (36.7 C), Min:97.52 F (36.4 C), Max:98.8 F (37.1 C)  Recent Labs  Lab 03/26/21 0442 03/26/21 1632 03/26/21 1724 03/27/21 0509 03/27/21 1614 03/28/21 0405  WBC 17.5*  --  18.9* 22.1* 23.2* 22.0*  CREATININE 1.74* 1.65*  --  1.74* 1.69* 1.60*    Estimated Creatinine Clearance: 59.4 mL/min (A) (by C-G formula based on SCr of 1.6 mg/dL (H)).    Allergies  Allergen Reactions  . Shellfish-Derived Products Anaphylaxis  . Wasp Venom Protein Anaphylaxis  . Penicillins Other (See Comments)    Childhood reaction    Antimicrobials this admission: Vancomycin 5/31x1, 6/5 >>  Cefepime 6/5 >>  Ancef 5/31x2 Levofloxacin 6/1 x1  Dose adjustments this admission: N/A  Microbiology results: 5/23 MRSA PCR: neg 5/31 MRSA PCR: neg 6/5 MRSA PCR:  Richardine Service, PharmD, BCPS PGY2 Cardiology Pharmacy Resident Phone: [phone] 03/28/2021  12:59 PM  Please check [email or web address] for unit-specific pharmacy phone numbers.

## 2021-03-28 NOTE — Progress Notes (Signed)
      [address]       [place] [address]             [phone]                 4 Days Post-Op Procedure(s) (LRB): EXPLORATION POST OPERATIVE OPEN HEART (N/A)   Events: No events _______________________________________________________________ Vitals: BP (!) 117/54   Pulse (!) 103   Temp 98.6 F (37 C)   Resp (!) 26   Ht '6\' 2"'$  (1.88 m)   Wt 80.5 kg   SpO2 (!) 89%   BMI 22.79 kg/m   - Neuro: alert NAD  - Cardiovascular: sinus tach  Drips: amio, milr 0.125, levo 20  PAP: (44-68)/(13-27) 63/24 CVP:  [8 mmHg-17 mmHg] 17 mmHg CO:  [10.2 L/min-11.4 L/min] 10.2 L/min CI:  [5.2 L/min/m2-5.8 L/min/m2] 5.2 L/min/m2  - Pulm: EWOB    ABG    Component Value Date/Time   PHART 7.453 (H) 04/01/2021 1402   PCO2ART 27.5 (L) 03/30/2021 1402   PO2ART 97 04/13/2021 1402   HCO3 19.4 (L) 04/06/2021 1402   TCO2 20 (L) 03/31/2021 1402   ACIDBASEDEF 4.0 (H) 04/20/2021 1402   O2SAT 84.4 03/28/2021 0537    - Abd: ND - Extremity: warm  .Intake/Output      06/04 0701 06/05 0700 06/05 0701 06/06 0700   P.O. 660 120   I.V. (mL/kg) 1292.2 (16.1) 124 (1.5)   Blood     NG/GT 1450 103.3   Total Intake(mL/kg) 3402.2 (42.3) 347.3 (4.3)   Urine (mL/kg/hr) 0 (0)    Other 2944 387   Chest Tube 1093 50   Total Output 4037 437   Net -634.9 -89.7           _______________________________________________________________ Labs: CBC Latest Ref Rng & Units 03/28/2021 03/28/2021 03/28/2021  WBC 4.0 - 10.5 K/uL - 22.0(H) -  Hemoglobin 13.0 - 17.0 g/dL - 9.6(L) -  Hematocrit 39.0 - 52.0 % - 30.4(L) -  Platelets 150 - 400 K/uL 68(L) 66(L) 65(L)   CMP Latest Ref Rng & Units 03/28/2021 03/27/2021 03/27/2021  Glucose 70 - 99 mg/dL 165(H) 101(H) 153(H)  BUN 6 - 20 mg/dL '17 15 14  '$ Creatinine 0.61 - 1.24 mg/dL 1.60(H) 1.69(H) 1.74(H)  Sodium 135 - 145 mmol/L 134(L) 135 135  Potassium 3.5 - 5.1 mmol/L 4.7 4.6 4.6  Chloride 98 - 111 mmol/L 103 102 100  CO2 22 - 32 mmol/L '26 26 25   '$ Calcium 8.9 - 10.3 mg/dL 7.7(L) 7.7(L) 8.0(L)  Total Protein 6.5 - 8.1 g/dL 5.5(L) - 5.5(L)  Total Bilirubin 0.3 - 1.2 mg/dL 5.0(H) - 5.8(H)  Alkaline Phos 38 - 126 U/L 114 - 82  AST 15 - 41 U/L 31 - 26  ALT 0 - 44 U/L 10 - 6    CXR: L Effusion  _______________________________________________________________  Assessment and Plan: POD 5 s/p Cabg, POD 4 s/p re-exploration  Neuro: pain controlled.  MS improved CV: on milr, and levo.  Off epi.  Sildenafil started  Pulm: continue pulm toilet Renal: on CRRT.  Pulling 50-100/hr GI: tolerating PO diet.  Will remove feeding tube.  Will watch for nausea given pressor requirement Heme: hgb stable.  Plts 51 --> 66.  Coming up slowly.  Awaiting HIT panel ID: Afebrile.  ?pneumonia on L Endo: SSI Dispo: continue ICU care   Lajuana Matte 03/28/2021 9:50 AM

## 2021-03-28 NOTE — Progress Notes (Signed)
NAME:  David Rose., MRN:  [id number], DOB:  February 03, 1966, LOS: 17 ADMISSION DATE:  03/22/2021, CONSULTATION DATE:  04/21/2021 REFERRING MD:  David Rose, CHIEF COMPLAINT:  code   History of Present Illness:  55 year old man w/ hx of DM2, HTN, PAD, ESRD, cirrhosis (? Cardiac) who presented for symptomatic ischemic cardiomyopathy.  Underwent pre-op optimization with CHF, GI, and nephrology teams then high risk CABG 5/31. On  5/31-6/1 David Rose unfortunately developed respiratory distress followed by PEA and chest compressions. This caused increased bleeding around surgical sites, underwent mediastinal exploration this AM with some bleeding around mammary graft site and diffuse oozing.  Received multiple units of blood products overnight.  He is intubated, on pressors.  PCCM asked to assist with management.  Pertinent  Medical History  ESRD on HD Cardiac cirrhosis with portal hypertension IBS IDDM  Significant Hospital Events: Including procedures, antibiotic start and stop dates in addition to other pertinent events   . 5/23 admitted . 5/26 paracentesis 4L . 5/30 HD . 5/31 CABG . 5/31-6/1 code, OR for mediastinal exploration 2/2 hemorrhagic shock induced by CPR. Factor 7, protamine, FFP, cryo given . 6/1 PCCM consult, 4u prbc and 1 plt . R pigtail chest tube placed due to limitation of vent weaning. . 6/2 extubated, chest tube 3.2L out past 24 hrs . 6/3 remains on 2L David Rose, chest tube with 500cc out/ 24 hrs . 6/4 remains on CRRT, 2L David Rose, milrinone, NE. 3 chest tubes remain.  Interim History / Subjective:  Complaining of coughing more today.  Objective   Blood pressure (!) 100/45, pulse (!) 103, temperature 98.6 F (37 C), resp. rate 20, height '6\' 2"'$  (1.88 m), weight 80.5 kg, SpO2 98 %. 4LNC PAP: (44-67)/(13-27) 64/27 CVP:  [8 mmHg-16 mmHg] 16 mmHg CO:  [10 L/min-11.4 L/min] 10.2 L/min CI:  [5 L/min/m2-5.8 L/min/m2] 5.2 L/min/m2      Intake/Output Summary (Last 24 hours) at 03/28/2021  0732 Last data filed at 03/28/2021 0700 Gross per 24 hour  Intake 3402.15 ml  Output 4037 ml  Net -634.85 ml   Filed Weights   03/26/21 0500 03/27/21 0715 03/28/21 0500  Weight: 80.9 kg 80.5 kg 80.5 kg    Examination: General: chronically ill appearing man lying in bed in NAD HEENT: David Rose/AT, eyes mildly icteric  Neck: RIJ introducer sheath Neuro: awake, alert, answering questions, moving all extremities CV: S1S2, precordial heave PULM:  Breathing comfortably on [place], large bore chest tubes with blood output  GI: soft, NT, ND Extremities: warm, pedal edema, no clubbing Skin: warm, dry, no rashes  Chest tube output 15cc pigtail, large bore tubes 1078 mL  Labs/imaging that I havepersonally reviewed  (right click and "Reselect all SmartList Selections" daily)  coox 87, confirmed again 84% Bicarb 26 BUN 17 Cr 1.6 t bili 5 WBC 22 H/H 9.6/30.4 Platelets 66 D-dimer 8.11 INR 1.4 Fibrinogen 517  CXR personally reviewed> LLL infiltrate, improved effusion since yesterday  Resolved Hospital Problem list   Acute Encephalopathy  Assessment & Plan:   Acute hypoxemic respiratory failure- improved Bilateral pleural effusions; 2 surgical chest tubes and inferior R sided pigtail tube in place Likely LLL HAP -con't chest tubes per TCTS -wean O2 as able -mobilize as able; limited vascular access requiring femoral HD catheter -start cefepime and vanc empirically for HAP; repeat MRSA swab  Post-op mediastinal hemorrhage Cirrhotic and hemorrhage- induced consumptive coagulopathy Hemorrhagic shock, resolved Acute blood loss anemia Thrombocytopenia- in setting of blood loss, coagulopathy, and cirrhosis HGB stable  9.7>9.8, platelets very slowly recovering -con't to monitor -transfuse to maintain Hb >7 -HIT panel, SRA pending -daily INR  In hospital cardiac arrest precipitated by respiratory distress- PEA, minimal downtime. 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 Vaso off. Continue levophed. Goal SBP>95. -Con't inotropes per cardiology- milrinone to help with PH -swan removed today  ESRD on HD PTA -Appreciate Neprology's assistance -Continue CRRT -strict I/Os -renally dose meds  DM2, controlled -Continue SSI PRN -BG 140-180  Best practice (right click and "Reselect all SmartList Selections" daily)  Diet:  Oral Pain/Anxiety/Delirium protocol (if indicated): No VAP protocol (if indicated): Not indicated DVT prophylaxis: SCD and Contraindicated SCD GI prophylaxis: PPI Glucose control:  SSI Yes Central venous access:  Yes, and it is still needed Arterial line:  Yes, and it is still needed Foley:  N/A Mobility:  bed rest  PT consulted: Yes Last date of multidisciplinary goals of care discussion [patient and wife updated on rounds] Code Status:  full code Disposition: per primary   This patient is critically ill with multiple organ system failure which requires frequent high complexity decision making, assessment, support, evaluation, and titration of therapies. This was completed through the application of advanced monitoring technologies and extensive interpretation of multiple databases. During this encounter critical care time was devoted to patient care services described in this note for 36 minutes.  David Hy, DO 03/28/21 1:00 PM [facility]

## 2021-03-28 NOTE — Progress Notes (Signed)
Patient ID: David Rose., male   DOB: [birth year], 55 y.o.   MRN: [id number] [facility] Progress Note   Assessment/ Plan:   1.  Coronary artery disease: With previously documented severe three-vessel disease and decreased ejection fraction and yesterday had four-vessel CABG by Dr. Orvan Seen.  Postoperatively suffered PEA arrest after extubation requiring resuscitation with CPR and required mediastinal wound exploration for bleeding.  Remains on pressors and will re-attempt ultrafiltration. 2.  End-stage renal disease: He is usually on a Tuesday/Thursday/Saturday dialysis schedule via right upper arm AV fistula and last had hemodialysis on 5/30 and thereafter started on CRRT postoperatively because of hemodynamic instability. Will prescribe albumin bolus and re-attempt UF on CRRT. 3.  Pulmonary hypertension/chronic systolic heart failure: Requiring volume resuscitation following bleeding/PEA arrest as well as products for anticoagulation and now fluid positive, undertaking CRRT/ultrafiltration. Started on sildenafil.  4.  Anemia of chronic kidney disease: Hemoglobin and hematocrit acceptable range, continue to treat with ESA and monitor for overt losses. 5.  Chronic kidney disease/metabolic bone disease: Calcium and phosphorus level within acceptable range and on 3 times weekly Sensipar for PTH control. 6.  History of cirrhosis with recurrent ascites: Based on exam/weights; he may benefit from paracentesis at this time.  Subjective:   Limited success with CRRT-UF overnight due to hypotension.   Objective:   BP (!) 100/45   Pulse (!) 103   Temp 98.6 F (37 C)   Resp 20   Ht '6\' 2"'$  (1.88 m)   Wt 80.5 kg   SpO2 98%   BMI 22.79 kg/m   Intake/Output Summary (Last 24 hours) at 03/28/2021 [id number] Last data filed at 03/28/2021 0700 Gross per 24 hour  Intake 3402.15 ml  Output 4037 ml  Net -634.85 ml   Weight change:   Physical Exam: Gen: Propped up in bed, appears comfortable   CVS: Pulse regular rhythm, normal rate, sternal wound well apposed, S1 with loud S2 Resp: Clear to auscultation bilaterally, no rales/rhonchi over anterior chest Abd: Soft, flat, nontender Ext: No lower extremity edema, right upper arm AV fistula with poor augmentation and low pitched bruit  Imaging: DG Chest Port 1 View  Result Date: 03/27/2021 CLINICAL DATA:  Status post extubation. Recent coronary artery bypass grafting EXAM: PORTABLE CHEST 1 VIEW COMPARISON:  March 25, 2021 FINDINGS: Endotracheal tube has been removed. Feeding tube tip is below the diaphragm. Swan-Ganz catheter tip is in the main pulmonary outflow tract. Mediastinal drain present. Chest tube evident on each side. There is a pigtail catheter on the right with the tip either in the right base hemithorax or right upper abdomen. There is a small right lateral pneumothorax that tension component. There is a left pleural effusion with ill-defined airspace opacity in the left mid and lower lung regions, increased from recent study. Right lung is clear. There is cardiomegaly with postoperative changes. The pulmonary vascularity is normal. No adenopathy. No bone lesions. IMPRESSION: Tube and catheter positions as described. Small right lateral pneumothorax without tension component. Left pleural effusion with suspected pneumonia in portions of the left mid and lower lung regions. Right lung clear. Stable cardiomegaly. Electronically Signed   By: Lowella Grip III M.D.   On: 03/27/2021 08:47    Labs: BMET Recent Labs  Lab 04/19/2021 1630 03/25/21 0004 03/25/21 0442 03/25/21 1523 03/26/21 0442 03/26/21 1632 03/27/21 0509 03/27/21 1614 03/28/21 0405  NA 140   < > 138 139 134* 137 135 135 134*  K 3.4*   < >  3.9 4.7 4.7 4.6 4.6 4.6 4.7  CL 105   < > 103 103 101 103 100 102 103  CO2 22   < > '24 27 26 28 25 26 26  '$ GLUCOSE 120*   < > 141* 115* 162* 100* 153* 101* 165*  BUN 19   < > '16 13 11 11 14 15 17  '$ CREATININE 2.69*   < > 2.16*  1.95* 1.74* 1.65* 1.74* 1.69* 1.60*  CALCIUM 8.9   < > 8.6* 8.3* 8.0* 8.1* 8.0* 7.7* 7.7*  PHOS 3.3  --  3.0 4.3 3.5 3.2  --  1.9* 1.9*   < > = values in this interval not displayed.   CBC Recent Labs  Lab 03/26/21 1724 03/27/21 0009 03/27/21 0509 03/27/21 1136 03/27/21 1614 03/28/21 0000 03/28/21 0405 03/28/21 0537  WBC 18.9*  --  22.1*  --  23.2*  --  22.0*  --   HGB 9.7*  --  9.9*  --  9.8*  --  9.6*  --   HCT 30.3*  --  31.1*  --  29.8*  --  30.4*  --   MCV 94.1  --  95.7  --  93.7  --  96.2  --   PLT 53*  50*   < > 53*  51*   < > 57*  57* 65* 66* 68*   < > = values in this interval not displayed.   Medications:    . sodium chloride   Intravenous Once  . sodium chloride   Intravenous Once  . sodium chloride   Intravenous Once  . sodium chloride   Intravenous Once  . acetaminophen  1,000 mg Oral Q6H   Or  . acetaminophen (TYLENOL) oral liquid 160 mg/5 mL  1,000 mg Per Tube Q6H  . aspirin EC  81 mg Oral Daily  . atorvastatin  40 mg Oral Daily  . B-complex with vitamin C  1 tablet Oral Daily  . bisacodyl  10 mg Oral Daily   Or  . bisacodyl  10 mg Rectal Daily  . Chlorhexidine Gluconate Cloth  6 each Topical Daily  . cinacalcet  30 mg Oral Q T,Th,Sa-HD  . [START ON 04/01/2021] darbepoetin (ARANESP) injection - NON-DIALYSIS  60 mcg Subcutaneous Q Thu-1800  . docusate sodium  200 mg Oral Daily  . feeding supplement (PROSource TF)  45 mL Per Tube QID  . insulin aspart  0-24 Units Subcutaneous Q4H  . mouth rinse  15 mL Mouth Rinse BID  . pantoprazole  40 mg Oral Daily  . sildenafil  20 mg Oral TID  . sodium chloride flush  10-40 mL Intracatheter Q12H  . sodium chloride flush  3 mL Intravenous Q12H   Elmarie Shiley, MD 03/28/2021, 7:11 AM

## 2021-03-28 NOTE — Progress Notes (Signed)
Inpatient Rehab Admissions Coordinator Note:   Per PT/OT recommendations, pt was screened for CIR candidacy by Gayland Curry, MS, CCC-SLP.  At this time we are not recommending an inpatient rehab consult. Note pt receiving CRRT.  Will follow pt's medical workup and progress with therapies from a distance to determine appropriateness of CIR.   Please contact me with questions.    Gayland Curry, [place], [place] Admissions Coordinator [phone] 03/28/21 10:55 AM

## 2021-03-28 NOTE — Progress Notes (Signed)
Patient ID: David Hallak., male   DOB: December 21, 1965, 55 y.o.   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  Based on low EF and elevated PA pressure, right and left heart cath was done in 5/22.  This showed severe 3VD, high cardiac output with moderate pulmonary hypertension but low PVR.  He is planned for CABG, but admitted pre-op for optimization.    Subjective:    CABG x 4 on 5/31 with LIMA-LAD, SVG-RCA, SVG-OM, SVG-D.   Coagulopathic post-op with multiple blood products.  Extubated post-op but developed respiratory distress => PEA arrest then VT. Reintubated.  ROSC with ACLS.  Returned to OR for mediastinal exploration 6/1.  Extubated 6/2  Currently on NE 20, milrinone 0.125, amiodarone 30.  Co-ox 84%, CVP 17.  Not able to pull much yesterday via CVVH due to low BP.   CVVH with UF running, aimed for about 50 cc/hr yesterday.    HIT sent 6/2 (pending) -->Plts 47 => 51 => 68. He had copious chest tube drainage after PT yesterday, slowed overnight.   Still in what appears to be atypical atrial flutter (based on ECG yesterday).   Swan #s CVP 17 PA 67/20  CI 5.2  No complaints this morning, was able to work with PT yesterday.    Objective:   Weight Range: 80.5 kg Body mass index is 22.79 kg/m.   Vital Signs:   Temp:  [97.52 F (36.4 C)-98.96 F (37.2 C)] 98.6 F (37 C) (06/05 0742) Pulse Rate:  [102-108] 104 (06/05 0742) Resp:  [13-39] 22 (06/05 0742) BP: (100-132)/(45-53) 100/45 (06/05 0700) SpO2:  [93 %-100 %] 93 % (06/05 0742) Arterial Line BP: (89-135)/(34-62) 113/44 (06/05 0742) Weight:  [80.5 kg] 80.5 kg (06/05 0500) Last BM Date: 03/02/2021  Weight change: Filed Weights   03/26/21 0500  03/27/21 0715 03/28/21 0500  Weight: 80.9 kg 80.5 kg 80.5 kg    Intake/Output:   Intake/Output Summary (Last 24 hours) at 03/28/2021 0809 Last data filed at 03/28/2021 0800 Gross per 24 hour  Intake 3469.37 ml  Output 3957 ml  Net -487.63 ml      Physical Exam  CVP 17  General: NAD Neck: JVP 14+, no thyromegaly or thyroid nodule.  Lungs: Decreased at bases.  CV: Nondisplaced PMI.  Heart regular S1/S2, no S3/S4, no murmur.  No peripheral edema.   Abdomen: Soft, nontender, no hepatosplenomegaly, no distention.  Skin: Intact without lesions or rashes.  Neurologic: Alert and oriented x 3.  Psych: Normal affect. Extremities: No clubbing or cyanosis.  HEENT: Normal.    Telemetry   Suspect atypical atrial flutter 100s (personally reviewed)  EKG    No new EKG to review   Labs    CBC Recent Labs    03/27/21 1614 03/28/21 0000 03/28/21 0405 03/28/21 0537  WBC 23.2*  --  22.0*  --   HGB 9.8*  --  9.6*  --   HCT 29.8*  --  30.4*  --   MCV 93.7  --  96.2  --   PLT 57*  57*   < > 66* 68*   < > = values in this interval not displayed.   Basic  Metabolic Panel Recent Labs    03/27/21 0509 03/27/21 1614 03/28/21 0405  NA 135 135 134*  K 4.6 4.6 4.7  CL 100 102 103  CO2 '25 26 26  '$ GLUCOSE 153* 101* 165*  BUN '14 15 17  '$ CREATININE 1.74* 1.69* 1.60*  CALCIUM 8.0* 7.7* 7.7*  MG 2.2  --  2.2  PHOS  --  1.9* 1.9*   Liver Function Tests Recent Labs    03/27/21 0509 03/27/21 1614 03/28/21 0405  AST 26  --  31  ALT 6  --  10  ALKPHOS 82  --  114  BILITOT 5.8*  --  5.0*  PROT 5.5*  --  5.5*  ALBUMIN 3.0* 2.7* 2.6*   No results for input(s): LIPASE, AMYLASE in the last 72 hours. Cardiac Enzymes No results for input(s): CKTOTAL, CKMB, CKMBINDEX, TROPONINI in the last 72 hours.  BNP: BNP (last 3 results) Recent Labs    03/22/2021 1545  BNP 1,127.8*    ProBNP (last 3 results) No results for input(s): PROBNP in the last 8760 hours.   D-Dimer Recent Labs     03/28/21 0000 03/28/21 0537  DDIMER 7.18* 8.11*   Hemoglobin A1C No results for input(s): HGBA1C in the last 72 hours. Fasting Lipid Panel Recent Labs    03/28/21 0405  TRIG 54   Thyroid Function Tests No results for input(s): TSH, T4TOTAL, T3FREE, THYROIDAB in the last 72 hours.  Invalid input(s): FREET3  Other results:   Imaging    No results found.   Medications:     Scheduled Medications: . sodium chloride   Intravenous Once  . sodium chloride   Intravenous Once  . sodium chloride   Intravenous Once  . sodium chloride   Intravenous Once  . acetaminophen  1,000 mg Oral Q6H   Or  . acetaminophen (TYLENOL) oral liquid 160 mg/5 mL  1,000 mg Per Tube Q6H  . aspirin EC  81 mg Oral Daily  . atorvastatin  40 mg Oral Daily  . B-complex with vitamin C  1 tablet Oral Daily  . bisacodyl  10 mg Oral Daily   Or  . bisacodyl  10 mg Rectal Daily  . Chlorhexidine Gluconate Cloth  6 each Topical Daily  . cinacalcet  30 mg Oral Q T,Th,Sa-HD  . [START ON 04/01/2021] darbepoetin (ARANESP) injection - NON-DIALYSIS  60 mcg Subcutaneous Q Thu-1800  . docusate sodium  200 mg Oral Daily  . feeding supplement (PROSource TF)  45 mL Per Tube QID  . insulin aspart  0-24 Units Subcutaneous Q4H  . mouth rinse  15 mL Mouth Rinse BID  . pantoprazole  40 mg Oral Daily  . sildenafil  20 mg Oral TID  . sodium chloride flush  10-40 mL Intracatheter Q12H  . sodium chloride flush  3 mL Intravenous Q12H    Infusions: .  prismasol BGK 4/2.5 400 mL/hr at 03/28/21 0608  .  prismasol BGK 4/2.5 200 mL/hr at 03/27/21 1734  . sodium chloride    . sodium chloride Stopped (03/26/21 1410)  . albumin human 60 mL/hr at 03/28/21 0721  . amiodarone 30 mg/hr (03/28/21 0700)  . epinephrine Stopped (03/27/21 0216)  . feeding supplement (VITAL 1.5 CAL) 50 mL/hr at 03/28/21 0800  . lactated ringers    . lactated ringers    . lactated ringers 10 mL/hr at 03/28/21 0700  . milrinone 0.125 mcg/kg/min  (03/28/21 0700)  . norepinephrine (LEVOPHED) Adult infusion 20 mcg/min (03/28/21 0700)  . phenylephrine (  NEO-SYNEPHRINE) Adult infusion Stopped (04/03/2021 0504)  . prismasol BGK 4/2.5 1,500 mL/hr at 03/28/21 0458    PRN Medications: heparin, lactated ringers, lidocaine (PF), lip balm, metoprolol tartrate, morphine injection, ondansetron (ZOFRAN) IV, oxyCODONE, sodium chloride, sodium chloride flush, sodium chloride flush, traMADol     Assessment/Plan   1. CAD: Severe 3VD with decreased EF.  I reviewed films with Dr. Ellyn Hack, PCI would be possible but would be difficult/high risk with multiple lesions and heavy calcification. 5/31 LIMA-LAD, SVG-RCA, SVG-OM, SVG-D. - Continue ASA 81.  - Continue atorvastatin 40 mg daily.  - CT drainage slowed overnight  2. Acute/chronic HF with mid range EF => cardiogenic shock post-CABG: Suspect ischemic cardiomyopathy.  Echo 4/22 with EF 40-45% with moderately decreased RV systolic function and PASP 99, moderate TR. There was a prominent component of RV failure with RA pressure elevated out of proportion to PCWP (CVP/PCWP 0.875 on RHC) but PAPI adequate at 2.8. Post-op had PEA arrest then VT. Currently on NE 20, milrinone 0.125.  CO-OX 84%, CI 5.2, PA pressure still moderately elevated, CVP 17.  Not much UF yesterday due to soft BP.  - Leave milrinone at 0.125 to help with elevated PA pressure.  - Wean down NE as able, will likely need to continue for CVVH today.  - Continue CVVH, aim for net negative 50-100 cc/hr. Accept SBP >95.  - Remove Swan, follow CVP/co-ox off introducer.  3. ESRD: Suspected due to diabetes.  Volume up post-op with multiple blood products. - CVVH run 50-100 cc/hr net negative UF today. Hopefully can stop CVVH after today.  4. 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  The CO was not markedly high.  Suspect he has a component of  portopulmonary hypertension. Oxygen saturation was 99% on RA pre-op, so no evidence for hepatopulmonary syndrome.   Moderate PH currently.  - Keep milrinone at 0.125 today.   - Continue sildenafil 20 mg tid  5. Cirrhosis: Noted on abdominal US from 2020.  H/o paracentesis.  Had workup at [facility] (though patient does not remember this), viral hepatitis labs negative.  They ended up think that the cirrhosis was cardiogenic.  Repeat abdominal US 5/23 w/ nodular hepatic parenchymal pattern with increased echogenicity consistent with the patient's known cirrhosis. No focal hepatic abnormality identified. Portal vein is patent. Moderate ascites. GI has seen, patient had 4L paracentesis on 5/26. 6. Coagulopathy: Post-op bleeding in setting of coagulopathy post-CABG in patient with cirrhosis.  Currently seems controlled.  Platelets dropped, HIT sent  - Off heparin pending HIT.  7. VT: Had after PEA arrest am 6/1, continue amiodarone gtt 30.  8. Anemia: ABLA.  Stable today. Follow CBC daily.   9. Pleural effusions: Right effusion with chest tube, ?hepatic hydrothorax.  CT drainage slowing.     10. PEA arrest: Respiratory arrest post-extubation.  ROSC with CPR.  11. Acute hypoxemic respiratory failure:  Extubated 6/2  12. Atrial fibrillation: Atypical atrial flutter post-op.  Appears to be in AFL today.   - Continue amio drip.  - Will get ECG.  - HIT panel pending so off heparin gtt, also had a lot of chest tube drainage yesterday during the day.   CRITICAL CARE Performed by: Loralie Champagne  Total critical care time: 35 minutes  Critical care time was exclusive of separately billable procedures and treating other patients.  Critical care was necessary to treat or prevent imminent or life-threatening deterioration.  Critical care  was time spent personally by me on the following activities: development of treatment plan with patient and/or surrogate as well as nursing, discussions with consultants,  evaluation of patient's response to treatment, examination of patient, obtaining history from patient or surrogate, ordering and performing treatments and interventions, ordering and review of laboratory studies, ordering and review of radiographic studies, pulse oximetry and re-evaluation of patient's condition.  Loralie Champagne 03/28/2021 8:09 AM

## 2021-03-29 LAB — MAGNESIUM: Magnesium: 2.3 mg/dL (ref 1.7–2.4)

## 2021-03-29 LAB — CBC
HCT: 30.2 % — ABNORMAL LOW (ref 39.0–52.0)
Hemoglobin: 9.4 g/dL — ABNORMAL LOW (ref 13.0–17.0)
MCH: 30.3 pg (ref 26.0–34.0)
MCHC: 31.1 g/dL (ref 30.0–36.0)
MCV: 97.4 fL (ref 80.0–100.0)
Platelets: 91 10*3/uL — ABNORMAL LOW (ref 150–400)
RBC: 3.1 MIL/uL — ABNORMAL LOW (ref 4.22–5.81)
RDW: 19.2 % — ABNORMAL HIGH (ref 11.5–15.5)
WBC: 17.5 10*3/uL — ABNORMAL HIGH (ref 4.0–10.5)
nRBC: 0.1 % (ref 0.0–0.2)

## 2021-03-29 LAB — DIC (DISSEMINATED INTRAVASCULAR COAGULATION)PANEL
D-Dimer, Quant: 14.06 ug/mL-FEU — ABNORMAL HIGH (ref 0.00–0.50)
D-Dimer, Quant: 13.93 ug/mL-FEU — ABNORMAL HIGH (ref 0.00–0.50)
Fibrinogen: 513 mg/dL — ABNORMAL HIGH (ref 210–475)
Fibrinogen: 494 mg/dL — ABNORMAL HIGH (ref 210–475)
INR: 1.3 — ABNORMAL HIGH (ref 0.8–1.2)
INR: 1.3 — ABNORMAL HIGH (ref 0.8–1.2)
Platelets: 91 10*3/uL — ABNORMAL LOW (ref 150–400)
Platelets: 92 10*3/uL — ABNORMAL LOW (ref 150–400)
Prothrombin Time: 16.3 seconds — ABNORMAL HIGH (ref 11.4–15.2)
Prothrombin Time: 16.6 seconds — ABNORMAL HIGH (ref 11.4–15.2)
Smear Review: NONE SEEN
Smear Review: NONE SEEN
aPTT: 43 seconds — ABNORMAL HIGH (ref 24–36)
aPTT: 43 seconds — ABNORMAL HIGH (ref 24–36)

## 2021-03-29 LAB — COMPREHENSIVE METABOLIC PANEL
ALT: 13 U/L (ref 0–44)
AST: 38 U/L (ref 15–41)
Albumin: 2.9 g/dL — ABNORMAL LOW (ref 3.5–5.0)
Alkaline Phosphatase: 162 U/L — ABNORMAL HIGH (ref 38–126)
Anion gap: 8 (ref 5–15)
BUN: 17 mg/dL (ref 6–20)
CO2: 25 mmol/L (ref 22–32)
Calcium: 7.7 mg/dL — ABNORMAL LOW (ref 8.9–10.3)
Chloride: 101 mmol/L (ref 98–111)
Creatinine, Ser: 1.53 mg/dL — ABNORMAL HIGH (ref 0.61–1.24)
GFR, Estimated: 53 mL/min — ABNORMAL LOW (ref >60)
Glucose, Bld: 132 mg/dL — ABNORMAL HIGH (ref 70–99)
Potassium: 5 mmol/L (ref 3.5–5.1)
Sodium: 134 mmol/L — ABNORMAL LOW (ref 135–145)
Total Bilirubin: 4.7 mg/dL — ABNORMAL HIGH (ref 0.3–1.2)
Total Protein: 5.9 g/dL — ABNORMAL LOW (ref 6.5–8.1)

## 2021-03-29 LAB — RENAL FUNCTION PANEL
Albumin: 2.8 g/dL — ABNORMAL LOW (ref 3.5–5.0)
Anion gap: 11 (ref 5–15)
BUN: 18 mg/dL (ref 6–20)
CO2: 23 mmol/L (ref 22–32)
Calcium: 7.7 mg/dL — ABNORMAL LOW (ref 8.9–10.3)
Chloride: 99 mmol/L (ref 98–111)
Creatinine, Ser: 1.61 mg/dL — ABNORMAL HIGH (ref 0.61–1.24)
GFR, Estimated: 50 mL/min — ABNORMAL LOW (ref >60)
Glucose, Bld: 209 mg/dL — ABNORMAL HIGH (ref 70–99)
Phosphorus: 5.4 mg/dL — ABNORMAL HIGH (ref 2.5–4.6)
Potassium: 4.8 mmol/L (ref 3.5–5.1)
Sodium: 133 mmol/L — ABNORMAL LOW (ref 135–145)

## 2021-03-29 LAB — GLUCOSE, CAPILLARY
Glucose-Capillary: 113 mg/dL — ABNORMAL HIGH (ref 70–99)
Glucose-Capillary: 117 mg/dL — ABNORMAL HIGH (ref 70–99)
Glucose-Capillary: 123 mg/dL — ABNORMAL HIGH (ref 70–99)
Glucose-Capillary: 156 mg/dL — ABNORMAL HIGH (ref 70–99)
Glucose-Capillary: 183 mg/dL — ABNORMAL HIGH (ref 70–99)
Glucose-Capillary: 208 mg/dL — ABNORMAL HIGH (ref 70–99)

## 2021-03-29 LAB — COOXEMETRY PANEL
Carboxyhemoglobin: 2.2 % — ABNORMAL HIGH (ref 0.5–1.5)
Carboxyhemoglobin: 2.1 % — ABNORMAL HIGH (ref 0.5–1.5)
Methemoglobin: 1.2 % (ref 0.0–1.5)
Methemoglobin: 1.1 % (ref 0.0–1.5)
O2 Saturation: 93.3 %
O2 Saturation: 88.3 %
Total hemoglobin: 9.6 g/dL — ABNORMAL LOW (ref 12.0–16.0)
Total hemoglobin: 9.4 g/dL — ABNORMAL LOW (ref 12.0–16.0)

## 2021-03-29 LAB — PHOSPHORUS: Phosphorus: 2.2 mg/dL — ABNORMAL LOW (ref 2.5–4.6)

## 2021-03-29 MED ORDER — SODIUM PHOSPHATES 45 MMOLE/15ML IV SOLN
20.0000 mmol | Freq: Once | INTRAVENOUS | Status: AC
  Administered 2021-03-29: 20 mmol via INTRAVENOUS
  Filled 2021-03-29 (×2): qty 6.67

## 2021-03-29 MED ORDER — MIDODRINE HCL 5 MG PO TABS: 5.0000 mg | ORAL_TABLET | Freq: Three times a day (TID) | ORAL | Status: DC

## 2021-03-29 MED ORDER — BOOST / RESOURCE BREEZE PO LIQD CUSTOM
1.0000 | Freq: Three times a day (TID) | ORAL | Status: DC
  Administered 2021-03-29 – 2021-03-31 (×7): 1 via ORAL

## 2021-03-29 MED ORDER — MIDODRINE HCL 5 MG PO TABS
10.0000 mg | ORAL_TABLET | Freq: Three times a day (TID) | ORAL | Status: DC
  Administered 2021-03-29 – 2021-03-30 (×3): 10 mg via ORAL
  Filled 2021-03-29 (×3): qty 2

## 2021-03-29 MED ORDER — PROSOURCE PLUS PO LIQD
30.0000 mL | Freq: Three times a day (TID) | ORAL | Status: DC
  Administered 2021-03-29 – 2021-04-01 (×8): 30 mL via ORAL
  Filled 2021-03-29 (×8): qty 30

## 2021-03-29 MED FILL — Medication: Qty: 1 | Status: AC

## 2021-03-29 NOTE — Progress Notes (Addendum)
Patient ID: Jahmere Oseguera., male   DOB: 1966/01/30, 55 y.o.   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  Based on low EF and elevated PA pressure, right and left heart cath was done in 5/22.  This showed severe 3VD, high cardiac output with moderate pulmonary hypertension but low PVR.  He is planned for CABG, but admitted pre-op for optimization.    Subjective:    CABG x 4 on 5/31 with LIMA-LAD, SVG-RCA, SVG-OM, SVG-D.   Coagulopathic post-op with multiple blood products.  Extubated post-op but developed respiratory distress => PEA arrest then VT. Reintubated.  ROSC with ACLS.  Returned to OR for mediastinal exploration 6/1.  Extubated 6/2  Currently on NE 12, milrinone 0.125, amiodarone 30.  Co-ox 93%  CVVH with UF running, aimed for about 50 cc/hr  HIT sent 6/2 (pending) -->Plts 47 => 51 => 68=>91 . CT drainage slowing.     Feels ok. Having some nausea. No BM x 4 days.   Objective:   Weight Range: 80.5 kg Body mass index is 22.79 kg/m.   Vital Signs:   Temp:  [98.1 F (36.7 C)-98.8 F (37.1 C)] 98.2 F (36.8 C) (06/06 0329) Pulse Rate:  [103-108] 106 (06/06 0600) Resp:  [11-44] 29 (06/06 0600) BP: (96-140)/(50-82) 125/82 (06/06 0600) SpO2:  [89 %-100 %] 99 % (06/06 0600) Arterial Line BP: (83-135)/(39-61) 83/44 (06/06 0600) Last BM Date: 03/21/2021  Weight change: Filed Weights   03/26/21 0500 03/27/21 0715 03/28/21 0500  Weight: 80.9 kg 80.5 kg 80.5 kg    Intake/Output:   Intake/Output Summary (Last 24 hours) at 03/29/2021 0701 Last data filed at 03/29/2021 0600 Gross per 24 hour  Intake 3199.49 ml  Output 4973 ml  Net -1773.51 ml      Physical Exam  CVP 10-11 General:  In bed.  No  resp difficulty HEENT: normal Neck: supple. Difficult to assess. . Carotids 2+ bilat; no bruits. No lymphadenopathy or thryomegaly appreciated. RIJ introducer Cor: PMI nondisplaced. Tach Regular rate & rhythm. No rubs, gallops or murmurs. Lungs: clear Abdomen: soft, nontender, nondistended. No hepatosplenomegaly. No bruits or masses. Good bowel sounds. Extremities: no cyanosis, clubbing, rash, edema Neuro: alert & orientedx3, cranial nerves grossly intact. moves all 4 extremities w/o difficulty. Affect pleasant   Telemetry   Atrial Tach with PVCs 100s   EKG    Atrial Tach 108 bpm   Labs    CBC Recent Labs    03/28/21 1620 03/29/21 0121 03/29/21 0325  WBC 18.9*  --  17.5*  HGB 9.2*  --  9.4*  HCT 29.1*  --  30.2*  MCV 96.7  --  97.4  PLT 70*  71* 91* 91*  92*   Basic Metabolic Panel Recent Labs    03/28/21 0405 03/28/21 1620 03/29/21 0325  NA 134* 132* 134*  K 4.7 5.2* 5.0  CL 103 99 101  CO2 '26 26 25  '$ GLUCOSE 165* 174* 132*  BUN '17 18 17  '$ CREATININE 1.60* 1.48* 1.53*  CALCIUM 7.7* 7.4* 7.7*  MG 2.2  --  2.3  PHOS 1.9* 2.8 2.2*   Liver Function Tests Recent Labs    03/28/21 0405 03/28/21  1620 03/29/21 0325  AST 31  --  38  ALT 10  --  13  ALKPHOS 114  --  162*  BILITOT 5.0*  --  4.7*  PROT 5.5*  --  5.9*  ALBUMIN 2.6* 3.0* 2.9*   No results for input(s): LIPASE, AMYLASE in the last 72 hours. Cardiac Enzymes No results for input(s): CKTOTAL, CKMB, CKMBINDEX, TROPONINI in the last 72 hours.  BNP: BNP (last 3 results) Recent Labs    03/10/2021 1545  BNP 1,127.8*    ProBNP (last 3 results) No results for input(s): PROBNP in the last 8760 hours.   D-Dimer Recent Labs    03/29/21 0121 03/29/21 0325  DDIMER 14.06* 13.93*   Hemoglobin A1C No results for input(s): HGBA1C in the last 72 hours. Fasting Lipid Panel Recent Labs    03/28/21 0405  TRIG 54   Thyroid Function Tests No results for input(s): TSH, T4TOTAL, T3FREE, THYROIDAB in  the last 72 hours.  Invalid input(s): FREET3  Other results:   Imaging    DG CHEST PORT 1 VIEW  Result Date: 03/28/2021 CLINICAL DATA:  Pneumonia EXAM: PORTABLE CHEST 1 VIEW COMPARISON:  March 27, 2021 FINDINGS: Chest tubes and mediastinal drain unchanged. Pigtail catheter on the right unchanged in position. Small lateral pneumothorax on the right unchanged without tension component. Swan-Ganz catheter has been removed. Cordis tip in superior vena cava. Enteric tube has been removed. Left mid lower lung airspace opacity with small left pleural effusion remains. Right lung is clear. Heart is enlarged with pulmonary vascularity normal. Status post median sternotomy. No bone lesions. No adenopathy. IMPRESSION: Tube and catheter positions as described. Small lateral right pneumothorax without tension component again noted, stable. Airspace opacity consistent with pneumonia on the left again noted with small left pleural effusion. Right lung clear. Stable cardiomegaly. Electronically Signed   By: Lowella Grip III M.D.   On: 03/28/2021 11:33     Medications:     Scheduled Medications: . sodium chloride   Intravenous Once  . sodium chloride   Intravenous Once  . sodium chloride   Intravenous Once  . sodium chloride   Intravenous Once  . aspirin EC  81 mg Oral Daily  . atorvastatin  40 mg Oral Daily  . B-complex with vitamin C  1 tablet Oral Daily  . bisacodyl  10 mg Oral Daily   Or  . bisacodyl  10 mg Rectal Daily  . Chlorhexidine Gluconate Cloth  6 each Topical Daily  . cinacalcet  30 mg Oral Q T,Th,Sa-HD  . [START ON 04/01/2021] darbepoetin (ARANESP) injection - NON-DIALYSIS  60 mcg Subcutaneous Q Thu-1800  . docusate sodium  200 mg Oral Daily  . insulin aspart  0-24 Units Subcutaneous Q4H  . mouth rinse  15 mL Mouth Rinse BID  . pantoprazole  40 mg Oral Daily  . polyethylene glycol  17 g Oral Daily  . sildenafil  20 mg Oral TID  . sodium chloride flush  10-40 mL Intracatheter Q12H   . sodium chloride flush  3 mL Intravenous Q12H    Infusions: .  prismasol BGK 4/2.5 400 mL/hr at 03/28/21 2214  .  prismasol BGK 4/2.5 200 mL/hr at 03/28/21 2214  . sodium chloride    . sodium chloride Stopped (03/26/21 1410)  . amiodarone 30 mg/hr (03/29/21 0600)  . ceFEPime (MAXIPIME) IV Stopped (03/28/21 2244)  . epinephrine Stopped (03/27/21 0216)  . lactated ringers    . lactated ringers Stopped (03/28/21 1634)  . lactated  ringers 20 mL/hr at 03/29/21 0600  . milrinone 0.125 mcg/kg/min (03/29/21 0600)  . norepinephrine (LEVOPHED) Adult infusion 14 mcg/min (03/29/21 0600)  . phenylephrine (NEO-SYNEPHRINE) Adult infusion Stopped (04/14/2021 0504)  . prismasol BGK 4/2.5 1,500 mL/hr at 03/28/21 2214  . vancomycin      PRN Medications: heparin, lactated ringers, lidocaine (PF), lip balm, metoprolol tartrate, morphine injection, ondansetron (ZOFRAN) IV, oxyCODONE, sodium chloride, sodium chloride flush, sodium chloride flush, traMADol     Assessment/Plan   1. CAD: Severe 3VD with decreased EF.  I reviewed films with Dr. Ellyn Hack, PCI would be possible but would be difficult/high risk with multiple lesions and heavy calcification. 5/31 LIMA-LAD, SVG-RCA, SVG-OM, SVG-D. - Continue ASA 81.  - Continue atorvastatin 40 mg daily.  - CT output slowing.  2. Acute/chronic HF with mid range EF => cardiogenic shock post-CABG: Suspect ischemic cardiomyopathy.  Echo 4/22 with EF 40-45% with moderately decreased RV systolic function and PASP 99, moderate TR. There was a prominent component of RV failure with RA pressure elevated out of proportion to PCWP (CVP/PCWP 0.875 on RHC) but PAPI adequate at 2.8. Post-op had PEA arrest then VT. Currently on NE 12, milrinone 0.125.  CO-OX 93%. May need to add midodrine.  CVP coming down.   - Leave milrinone at 0.125 to help with elevated PA pressure.  - Continue to wean NE.   - Continue CVVH, aim for net negative 50-100 cc/hr. Accept SBP >95.  3. ESRD:  Suspected due to diabetes.  Volume up post-op with multiple blood products. - CVVH run 50-100 cc/hr net negative UF today. Hopefully can stop CVVH after today.  4. 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  The CO was not markedly high.  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 Oxygen saturation was 99% on RA pre-op, so no evidence for hepatopulmonary syndrome.   Moderate PH currently.  - Keep milrinone at 0.125 today.   - Continue sildenafil 20 mg tid  5. Cirrhosis: Noted on abdominal US from 2020.  H/o paracentesis.  Had workup at [facility] (though patient does not remember this), viral hepatitis labs negative.  They ended up think that the cirrhosis was cardiogenic.  Repeat abdominal US 5/23 w/ nodular hepatic parenchymal pattern with increased echogenicity consistent with the patient's known cirrhosis. No focal hepatic abnormality identified. Portal vein is patent. Moderate ascites. GI has seen, patient had 4L paracentesis on 5/26. 6. Coagulopathy: Post-op bleeding in setting of coagulopathy post-CABG in patient with cirrhosis.  Currently seems controlled.  Platelets dropped, HIT sent  - Off heparin pending HIT.  7. VT: Had after PEA arrest am 6/1, continue amiodarone gtt 30.  8. Anemia: ABLA.  Stable today. Hgb stable. .   9. Pleural effusions: Right effusion with chest tube, ?hepatic hydrothorax.  CT drainage slowing.     10. PEA arrest: Respiratory arrest post-extubation.  ROSC with CPR.  11. Acute hypoxemic respiratory failure:  Extubated 6/2  12. Atrial fibrillation: Atypical atrial flutter post-op.  Appears to be in atrial tach today. .   - Continue amio drip.  - HIT panel pending so off heparin gtt, also had a lot of chest tube drainage yesterday during the day.   PT/OT recommending CIR.   Amy Clegg NP-C  03/29/2021 7:01 AM  Patient seen with NP, agree with the above note.    Nausea this morning, constipated.   He is on milrinone 0.125, NE 14, amiodarone gtt.  CVVH ongoing, pulling 50-100 cc/hr. No weight yet.  CVP 10-11. He appears to be in atrial tachycardia.   General: NAD Neck: JVP 10 cm, no thyromegaly or thyroid nodule.  Lungs: Clear to auscultation bilaterally with normal respiratory effort. CV: Nondisplaced PMI.  Heart regular S1/S2, no S3/S4, no murmur.  No peripheral edema.   Abdomen: Soft, nontender, no hepatosplenomegaly, no distention.  Skin: Intact without lesions or rashes.  Neurologic: Alert and oriented x 3.  Psych: Normal affect. Extremities: No clubbing or cyanosis.  HEENT: Normal.   Has had atrial fib/atrial tachy, currently appears to be in atrial tachy rate 100s.  Continue amiodarone gtt, no heparin yet pending HIT (still not back).  Platelets improved.   Stop milrinone today and resend co-ox.  Start midodrine 5 mg tid.  Continue sildenafil 20 mg tid and norepinephrine as needed to support CVVH.  Would lean towards continuing CVVH for 1 more day for more fluid off (UF 50-100 cc/hr net).   Need to mobilize.  Would like to get HD catheter out of groin, move to neck.  Will need central line access of some sort also.  Discuss with TCTS/CCM.   CRITICAL CARE Performed by: Loralie Champagne  Total critical care time: 35 minutes  Critical care time was exclusive of separately billable procedures and treating other patients.  Critical care was necessary to treat or prevent imminent or life-threatening deterioration.  Critical care was time spent personally by me on the following activities: development of treatment plan with patient and/or surrogate as well as nursing, discussions with consultants, evaluation of patient's response to treatment, examination of patient, obtaining history from patient or surrogate, ordering and performing treatments and interventions, ordering and review of laboratory studies, ordering and review of radiographic  studies, pulse oximetry and re-evaluation of patient's condition.  Loralie Champagne 03/29/2021 7:52 AM

## 2021-03-29 NOTE — Plan of Care (Signed)

## 2021-03-29 NOTE — Progress Notes (Signed)
NAME:  David Adi., MRN:  [id number], DOB:  01/20/1966, LOS: 32 ADMISSION DATE:  02/25/2021, CONSULTATION DATE:  03/25/2021 REFERRING MD:  Verl Blalock, CHIEF COMPLAINT:  code   History of Present Illness:  55 year old man w/ hx of DM2, HTN, PAD, ESRD, cirrhosis (? Cardiac) who presented for symptomatic ischemic cardiomyopathy.  Underwent pre-op optimization with CHF, GI, and nephrology teams then high risk CABG 5/31. On  5/31-6/1 David Rose unfortunately developed respiratory distress followed by PEA and chest compressions. This caused increased bleeding around surgical sites, underwent mediastinal exploration this AM with some bleeding around mammary graft site and diffuse oozing.  Received multiple units of blood products overnight.  He is intubated, on pressors.  PCCM asked to assist with management.  Pertinent  Medical History  ESRD on HD Cardiac cirrhosis with portal hypertension IBS IDDM  Significant Hospital Events: Including procedures, antibiotic start and stop dates in addition to other pertinent events   . 5/23 admitted . 5/26 paracentesis 4L . 5/30 HD . 5/31 CABG . 5/31-6/1 code, OR for mediastinal exploration 2/2 hemorrhagic shock induced by CPR. Factor 7, protamine, FFP, cryo given . 6/1 PCCM consult, 4u prbc and 1 plt . R pigtail chest tube placed due to limitation of vent weaning. . 6/2 extubated, chest tube 3.2L out past 24 hrs . 6/3 remains on 2L [place], chest tube with 500cc out/ 24 hrs . 6/4 remains on CRRT, 2L [place], milrinone, NE. 3 chest tubes remain. .   Interim History / Subjective:  On higher dose NE when sleeping. Still on CRRT, chest tubes remain in place. He denies complaints.  Objective   Blood pressure 131/64, pulse (!) 106, temperature 98.7 F (37.1 C), temperature source Axillary, resp. rate (!) 22, height '6\' 2"'$  (1.88 m), weight 80.5 kg, SpO2 100 %.   CVP:  [8 mmHg-28 mmHg] 8 mmHg      Intake/Output Summary (Last 24 hours) at 03/29/2021 0903 Last data filed  at 03/29/2021 0900 Gross per 24 hour  Intake 3108.28 ml  Output 4633 ml  Net -1524.72 ml   Filed Weights   03/26/21 0500 03/27/21 0715 03/28/21 0500  Weight: 80.9 kg 80.5 kg 80.5 kg    Examination: General: chronically ill appearing man lying in bed in NAD  HEENT: [place]/AT, eyes anicteric Neck: RIJ introducer, swan removed Neuro: sleeping but easily arousable, moving all extremities, answering questions appropriately CV: S1S2, precordial heave bilaterally PULM:  Breathing comfortably on [place], no increased work of breathing. Still bloody ouput from chest tubes GI: soft, NT, ND Extremities: warm, dry, ongoing pedal edema Skin: warm, dry, pallor  Labs/imaging that I havepersonally reviewed  (right click and "Reselect all SmartList Selections" daily)  coox 88% Bicarb 25 BUN 17 Cr 1.53 t bili 4.7 WBC 17.5 H/H 9.4/30.2 Platelets 91 D-dimer 8.11 INR 1.3 Fibrinogen 494   Resolved Hospital Problem list   Acute Encephalopathy  Assessment & Plan:   Acute hypoxemic respiratory failure- improved Bilateral pleural effusions; 2 surgical chest tubes and inferior R sided pigtail tube in place Likely LLL HAP -Con't chest tubes. -weaning supplemental O2 as able -pulmonary hygiene, limited mobility due to femoral dialysis catheter- hopefully out tomorrow -con't empiric cefepime for pneumonia, d/c vanc with negative MRSA swab  Post-op mediastinal hemorrhage Cirrhotic and hemorrhage- induced consumptive coagulopathy Hemorrhagic shock, resolved Acute blood loss anemia Thrombocytopenia- in setting of blood loss, coagulopathy, and cirrhosis HGB stable 9.7>9.8, platelets very slowly recovering -con't to monitor -transfuse to maintain Hb >7 -HIT  panel, SRA pending> seems unlikely  -daily INR  In hospital cardiac arrest precipitated by respiratory distress- PEA, minimal downtime. 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 PVR 3.9- likely  portpulmonary HTN VT- On 6/1 -Con't daily ASA and atorvastatin -Started on sildenafil on 6/4 for PH -Continue amio gtt for A-fib -AC held in the setting of bleeding and ongoing thrombocytopenia.  -Adding midodrine TID. -Off milrinone. Norepi to maintain SBP >90. -hopefully off   ESRD on HD PTA -Appreciate Neprology's assistance -Continue CRRT; hopefully can switch to iHD tomorrow. -strict I/Os -renally dose meds  DM2, controlled -Continue SSI PRN -BG 140-180  Best practice (right click and "Reselect all SmartList Selections" daily)  Diet:  Oral Pain/Anxiety/Delirium protocol (if indicated): No VAP protocol (if indicated): Not indicated DVT prophylaxis: SCD and Contraindicated   GI prophylaxis: PPI Glucose control:  SSI Yes Central venous access:  Yes, and it is still needed Arterial line:  Yes, and it is still needed Foley:  N/A Mobility:  bed rest  PT consulted: Yes Last date of multidisciplinary goals of care discussion [patient updated on rounds] Code Status:  full code Disposition: per primary   This patient is critically ill with multiple organ system failure which requires frequent high complexity decision making, assessment, support, evaluation, and titration of therapies. This was completed through the application of advanced monitoring technologies and extensive interpretation of multiple databases. During this encounter critical care time was devoted to patient care services described in this note for 39 minutes.  David Hy, DO 03/29/21 1:28 PM [facility]

## 2021-03-29 NOTE — Progress Notes (Addendum)
Patient ID: David Rose., male   DOB: [birth year], 55 y.o.   MRN: [id number] S: Pt seen and examined while on CRRT.  No complaints this morning.  Still on levophed. O:BP (!) 116/59   Pulse (!) 107   Temp 98.7 F (37.1 C) (Axillary)   Resp 14   Ht '6\' 2"'$  (1.88 m)   Wt 80.5 kg   SpO2 100%   BMI 22.79 kg/m   Intake/Output Summary (Last 24 hours) at 03/29/2021 0935 Last data filed at 03/29/2021 0900 Gross per 24 hour  Intake 2947.15 ml  Output 4748 ml  Net -1800.85 ml   Intake/Output: I/O last 3 completed shifts: In: 4649.1 [P.O.:680; I.V.:1731; NG/GT:955; IV Piggyback:1283.2] Out: EV:[id number]; Chest Tube:770]  Intake/Output this shift:  Total I/O In: 137 [I.V.:137] Out: 212 [Other:212] Weight change:  Gen: NAD CVS: Tachy at 107 Resp: cta Abd: +BS, soft, NT/ND Ext: RUE avf +T/B, trace pretibial edema  Recent Labs  Lab 03/22/21 1631 03/12/2021 0430 04/20/2021 0714 04/07/2021 0720 03/25/21 0442 03/25/21 1523 03/26/21 0442 03/26/21 1632 03/27/21 0509 03/27/21 1614 03/28/21 0405 03/28/21 1620 03/29/21 0325  NA 134*   < > 142   < > 138 139 134* 137 135 135 134* 132* 134*  K 3.9   < > 3.5   < > 3.9 4.7 4.7 4.6 4.6 4.6 4.7 5.2* 5.0  CL 97*   < > 103   < > 103 103 101 103 100 102 103 99 101  CO2 27   < > 18*   < > '24 27 26 28 25 26 26 26 25  '$ GLUCOSE 134*   < > 280*   < > 141* 115* 162* 100* 153* 101* 165* 174* 132*  BUN 32*   < > 22*   < > '16 13 11 11 14 15 17 18 17  '$ CREATININE 6.04*   < > 3.51*   < > 2.16* 1.95* 1.74* 1.65* 1.74* 1.69* 1.60* 1.48* 1.53*  ALBUMIN 3.2*  --  2.2*   < > 3.1* 3.6 3.2* 3.1* 3.0* 2.7* 2.6* 3.0* 2.9*  CALCIUM 8.6*   < > 8.0*   < > 8.6* 8.3* 8.0* 8.1* 8.0* 7.7* 7.7* 7.4* 7.7*  PHOS  --   --   --    < > 3.0 4.3 3.5 3.2  --  1.9* 1.9* 2.8 2.2*  AST 11*  --  35  --  34  --  22  --  26  --  31  --  38  ALT 10  --  17  --  14  --  8  --  6  --  10  --  13   < > = values in this interval not displayed.   Liver Function Tests: Recent Labs  Lab  03/27/21 0509 03/27/21 1614 03/28/21 0405 03/28/21 1620 03/29/21 0325  AST 26  --  31  --  38  ALT 6  --  10  --  13  ALKPHOS 82  --  114  --  162*  BILITOT 5.8*  --  5.0*  --  4.7*  PROT 5.5*  --  5.5*  --  5.9*  ALBUMIN 3.0*   < > 2.6* 3.0* 2.9*   < > = values in this interval not displayed.   No results for input(s): LIPASE, AMYLASE in the last 168 hours. No results for input(s): AMMONIA in the last 168 hours. CBC: Recent Labs  Lab 03/27/21 0509 03/27/21 1136  03/27/21 1614 03/28/21 0000 03/28/21 0405 03/28/21 0537 03/28/21 1620 03/29/21 0121 03/29/21 0325  WBC 22.1*  --  23.2*  --  22.0*  --  18.9*  --  17.5*  HGB 9.9*  --  9.8*  --  9.6*  --  9.2*  --  9.4*  HCT 31.1*  --  29.8*  --  30.4*  --  29.1*  --  30.2*  MCV 95.7  --  93.7  --  96.2  --  96.7  --  97.4  PLT 53*  51*   < > 57*  57*   < > 66*   < > 70*  71* 91* 91*  92*   < > = values in this interval not displayed.   Cardiac Enzymes: No results for input(s): CKTOTAL, CKMB, CKMBINDEX, TROPONINI in the last 168 hours. CBG: Recent Labs  Lab 03/28/21 1624 03/28/21 2025 03/28/21 2340 03/29/21 0326 03/29/21 0737  GLUCAP 163* 94 98 113* 123*    Iron Studies: No results for input(s): IRON, TIBC, TRANSFERRIN, FERRITIN in the last 72 hours. Studies/Results: DG CHEST PORT 1 VIEW  Result Date: 03/28/2021 CLINICAL DATA:  Pneumonia EXAM: PORTABLE CHEST 1 VIEW COMPARISON:  March 27, 2021 FINDINGS: Chest tubes and mediastinal drain unchanged. Pigtail catheter on the right unchanged in position. Small lateral pneumothorax on the right unchanged without tension component. Swan-Ganz catheter has been removed. Cordis tip in superior vena cava. Enteric tube has been removed. Left mid lower lung airspace opacity with small left pleural effusion remains. Right lung is clear. Heart is enlarged with pulmonary vascularity normal. Status post median sternotomy. No bone lesions. No adenopathy. IMPRESSION: Tube and catheter  positions as described. Small lateral right pneumothorax without tension component again noted, stable. Airspace opacity consistent with pneumonia on the left again noted with small left pleural effusion. Right lung clear. Stable cardiomegaly. Electronically Signed   By: Lowella Grip III M.D.   On: 03/28/2021 11:33   . sodium chloride   Intravenous Once  . sodium chloride   Intravenous Once  . sodium chloride   Intravenous Once  . sodium chloride   Intravenous Once  . aspirin EC  81 mg Oral Daily  . atorvastatin  40 mg Oral Daily  . B-complex with vitamin C  1 tablet Oral Daily  . bisacodyl  10 mg Oral Daily   Or  . bisacodyl  10 mg Rectal Daily  . Chlorhexidine Gluconate Cloth  6 each Topical Daily  . cinacalcet  30 mg Oral Q T,Th,Sa-HD  . [START ON 04/01/2021] darbepoetin (ARANESP) injection - NON-DIALYSIS  60 mcg Subcutaneous Q Thu-1800  . docusate sodium  200 mg Oral Daily  . insulin aspart  0-24 Units Subcutaneous Q4H  . mouth rinse  15 mL Mouth Rinse BID  . midodrine  10 mg Oral TID WC  . pantoprazole  40 mg Oral Daily  . polyethylene glycol  17 g Oral Daily  . sildenafil  20 mg Oral TID  . sodium chloride flush  10-40 mL Intracatheter Q12H  . sodium chloride flush  3 mL Intravenous Q12H    BMET    Component Value Date/Time   NA 134 (L) 03/29/2021 0325   K 5.0 03/29/2021 0325   CL 101 03/29/2021 0325   CO2 25 03/29/2021 0325   GLUCOSE 132 (H) 03/29/2021 0325   BUN 17 03/29/2021 0325   CREATININE 1.53 (H) 03/29/2021 0325   CALCIUM 7.7 (L) 03/29/2021 0325   GFRNONAA 53 (L)  03/29/2021 0325   CBC    Component Value Date/Time   WBC 17.5 (H) 03/29/2021 0325   RBC 3.10 (L) 03/29/2021 0325   HGB 9.4 (L) 03/29/2021 0325   HCT 30.2 (L) 03/29/2021 0325   PLT 91 (L) 03/29/2021 0325   PLT 92 (L) 03/29/2021 0325   MCV 97.4 03/29/2021 0325   MCH 30.3 03/29/2021 0325   MCHC 31.1 03/29/2021 0325   RDW 19.2 (H) 03/29/2021 0325   LYMPHSABS 1.5 03/22/2021 1545   MONOABS 0.7  03/11/2021 1545   EOSABS 0.6 (H) 03/21/2021 1545   BASOSABS 0.1 03/07/2021 1545   Outpatient HD rx:   [facility] [phone] Use AVF; graft on inner arm  180 dialyzer 2K/2.5 Ca DF 500 BF 400 4 hours 15 g Heparin loading dose 2000 and hourly dose of 1200 EDW 78.5 kg Last post weight 78 kg on 5/21 (went in at 78 and came out at 78 kg) Meds: epogen 2400 units each tx venofer 50 mg weekly  Not on hectorol or calcitriol sensipar 30 mg three times a week with HD  Assessment/Plan:  1. ESRD - normally [place] dialysis in [place], last HD 5/30, now on CRRT since 04/04/2021 following CABG complicated by PEA arrest and need for pressors.  Still remains on pressors however he is feeling better.  Will plan for CRRT for another 24 hours and hope to transition to IHD.  Would hold off on placement of RIJ HD catheter for now since he has left IJ stenosis and concern for issues involving the right as well.      2. CAD s/p CABG x 4 complicated by PEA arrest.  Currently stable 3. Acute on chronic CHF with cardiogenic shock post CABG - currently on levophed, milrinone, and started on midodrine.  Ok to increase midodrine to 10 mg tid and wean levophed as tolerated. 4. Pulmonary HTN - per HF team 5. Cirrhosis - presumably cardiogenic.  S/p paracentesis on 03/18/21. 6. Hyperkalemia - stable 7. Anemia of ESRD - on ESA and transfuse prn.  8. Hypophosphatemia - will replete with IV phos and follow. Marguerite Olea, MD [employer] [phone]

## 2021-03-29 NOTE — Progress Notes (Addendum)
Nutrition Follow-up  DOCUMENTATION CODES:   Severe malnutrition in context of chronic illness  INTERVENTION:   Recommend replacement of Cortrak given severity of malnutrition and poor PO on clear liquid diet. Patient requesting to wait until Wednesday.  No BM x 6 days, recommend suppository    Boost Breeze po TID, each supplement provides 250 kcal and 9 grams of protein  30 ml ProSource Plus TID, each supplement provides 100 kcals and 15 grams protein.   B complex with Vitamin C to account for losses with CRRT  If tube replaced:  -Vital 1.5 @ 50 ml/hr via Cortrak  -ProSource TF 45 ml QID At goal rate TF provides: 1960 kcals, 125 grams protein, 917 ml free water.   NUTRITION DIAGNOSIS:   Severe Malnutrition related to chronic illness (CAD/CHF/ESRD) as evidenced by severe fat depletion,severe muscle depletion.  Ongoing  GOAL:   Patient will meet greater than or equal to 90% of their needs  Not meeting PO  MONITOR:   Vent status,Skin,TF tolerance,Weight trends,Labs,I & O's  REASON FOR ASSESSMENT:   Consult Enteral/tube feeding initiation and management  ASSESSMENT:   Patient with PMH significant for DM, HTN, PAD, CHF, CAD, ESRD, cirrhosis, and IBS. Presents this admission for CABG.   5/22- s/p RHC, severe 3vd, high cardiac output 5/26- paracentesis- drained 4L 5/31- s/p CABG 6/01- code, hemorrhagic shock, back to OR for mediastinal exploration 6/02- extubated  6/05- Cortrak pulled per provider   Requiring levophed. Remains on CRRT. Plan to transition to iHD in the next 24 hrs. Chest tube output decreased. Cortrak was removed yesterday per TCTS. Of note patient has been on a clear diet given ongoing nausea and vomiting (last two meal completions charted as 10%). RD observed patient vomit 200 ml at bedside. He has been provided zofran.   Recommend replacement of Cortrak today given poor PO intake, malnutrition status, and elevated needs on CRRT. Dicussed with  TCTS, okay to replace. Patient requests to wait until Wednesday and try supplements. Of note patient has not had BM in 5 days, recommend suppository.   EDW: 78.5 kg  Current weight: 80.9 kg  CRRT: 4463 ml x 24 hrs  Chest tubes: 510 ml x 24 hrs   Drips: levophed, sodium phosphate Medications: dulcolax, sensipar, aranesp, colace, SS novolog, miralax Labs: Na 134 (L) Phosphorous 2.2 (L) CBG 98-123  Diet Order:   Diet Order            Diet clear liquid Room service appropriate? Yes; Fluid consistency: Thin  Diet effective now                 EDUCATION NEEDS:   Not appropriate for education at this time  Skin:  Skin Assessment: Skin Integrity Issues: Skin Integrity Issues:: Incisions,DTI DTI: sacrum Incisions: R leg, chest  Last BM:  5/31  Height:   Ht Readings from Last 1 Encounters:  03/06/2021 '6\' 2"'$  (1.88 m)    Weight:   Wt Readings from Last 1 Encounters:  03/28/21 80.5 kg     BMI:  Body mass index is 22.79 kg/m.  Estimated Nutritional Needs:   Kcal:  2450-2650 kcal  Protein:  125-155 grams  Fluid:  >/= 2 L/day  Mariana Single RD, LDN Clinical Nutrition Pager listed in [place]

## 2021-03-29 NOTE — Progress Notes (Signed)
      [address]       [place] [address]             [phone]      POD # 6 CABG/ #5 takeback  Asleep  BP 124/61   Pulse (!) 102   Temp 98.4 F (36.9 C)   Resp 17   Ht '6\' 2"'$  (1.88 m)   Wt 80.5 kg   SpO2 100%   BMI 22.79 kg/m   Good sats on 1L [place]  Intake/Output Summary (Last 24 hours) at 03/29/2021 1722 Last data filed at 03/29/2021 1700 Gross per 24 hour  Intake 2281.65 ml  Output 4126 ml  Net -1844.35 ml   On CVVHD  K= 4.8  Continue current Rx  Vicie Cech C. Roxan Hockey, MD [facility] [phone]

## 2021-03-29 NOTE — Progress Notes (Signed)
NAME:  David Rose., MRN:  [id number], DOB:  April 12, 1966, LOS: 19 ADMISSION DATE:  02/28/2021, CONSULTATION DATE:  04/09/2021 REFERRING MD:  Verl Blalock, CHIEF COMPLAINT:  code   History of Present Illness:  55 year old man w/ hx of DM2, HTN, PAD, ESRD, cirrhosis (? Cardiac) who presented for symptomatic ischemic cardiomyopathy.  Underwent pre-op optimization with CHF, GI, and nephrology teams then high risk CABG 5/31. On  5/31-6/1 Mr. [name] unfortunately developed respiratory distress followed by PEA and chest compressions. This caused increased bleeding around surgical sites, underwent mediastinal exploration this AM with some bleeding around mammary graft site and diffuse oozing.  Received multiple units of blood products overnight.  He is intubated, on pressors.  PCCM asked to assist with management.  Pertinent  Medical History  ESRD on HD Cardiac cirrhosis with portal hypertension IBS IDDM  Significant Hospital Events: Including procedures, antibiotic start and stop dates in addition to other pertinent events   . 5/23 admitted . 5/26 paracentesis 4L . 5/30 HD . 5/31 CABG . 5/31-6/1 code, OR for mediastinal exploration 2/2 hemorrhagic shock induced by CPR. Factor 7, protamine, FFP, cryo given . 6/1 PCCM consult, 4u prbc and 1 plt . R pigtail chest tube placed due to limitation of vent weaning. . 6/2 extubated, chest tube 3.2L out past 24 hrs . 6/3 remains on 2L [place], chest tube with 500cc out/ 24 hrs . 6/4 remains on CRRT, 2L [place], milrinone, NE. 3 chest tubes remain.  Interim History / Subjective:   Patient has no complaints today CRRT running Chest tubes in place- last 24 hours 510 cc output, decreased from yesterday On levo and amio  Objective   Blood pressure (!) 116/59, pulse (!) 107, temperature 98.7 F (37.1 C), temperature source Axillary, resp. rate 14, height '6\' 2"'  (1.88 m), weight 80.5 kg, SpO2 100 %. 4LNC CVP:  [8 mmHg-28 mmHg] 8 mmHg      Intake/Output Summary (Last 24  hours) at 03/29/2021 0908 Last data filed at 03/29/2021 0900 Gross per 24 hour  Intake 2992.15 ml  Output 4748 ml  Net -1755.85 ml   Filed Weights   03/26/21 0500 03/27/21 0715 03/28/21 0500  Weight: 80.9 kg 80.5 kg 80.5 kg    Examination: General: critically ill appearing male.  HEENT: MM pink/moist; [place] in place; RIJ central line in place Neuro: AOx3 CV: s1s2, no m/r/g appreciated PULM:  Dim BS bilaterally; 2 l/m [place]; Large bore chest tubes bilaterally GI: soft, bsx4 active  Extremities: warm/dry, pedal edema; R femoral HD cath without erythema Skin: no rashes or lesions  Labs/imaging that I havepersonally reviewed  (right click and "Reselect all SmartList Selections" daily)   Creatinine 1.53; bun 17 (trending down) Phosph 2.2 Alk Phosph 162 t bili 4.7 (trending down) WBC 17.5 from 18.9 hgb 9.4 from 9.2 Platelets 91 from 71 D-dimer 13.93 Fibrinogen 494 INR 1.3 PT 16.6 PTT 43  Resolved Hospital Problem list   Acute Encephalopathy  Assessment & Plan:   Acute hypoxemic respiratory failure- improved Bilateral pleural effusions; 2 surgical chest tubes and inferior R sided pigtail tube in place Likely LLL HAP -CTS following: continue chest tube and monitor output -wean O2 for sats > 92% -Continue Cefepime for HAP; will dc vanc with negative MRSA PCR  Post-op mediastinal hemorrhage Cirrhotic and hemorrhage- induced consumptive coagulopathy Hemorrhagic shock, resolved Acute blood loss anemia Thrombocytopenia- in setting of blood loss, coagulopathy, and cirrhosis -Trend CBC and INR -transfuse for hgb <7 -HIT panel and SRA  pending since 6/2  In hospital cardiac arrest precipitated by respiratory distress- PEA, minimal downtime. 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 VT- On 6/1 - HF following: Levo for SBP goal >95; Plan to stop Milrinone and start on Midodrine 10 mg TID; will recheck  coox -Continue amio for A-fib -Continue ASA and Statin -continue sildenafil for PH -AC on hold due to thrombocytopenia (HIT and SRA pending)  ESRD on HD PTA -Nephro following: continue CRRT today; hopeful to transition to IHD tomorrow; hold off on changing right femoral HD (day 4x) cath given known IJ stenosis and concern of R IJ stenosis -renal dose meds  DM2, controlled -SSI and CBG monitoring  Best practice (right click and "Reselect all SmartList Selections" daily)  Diet:  Oral Pain/Anxiety/Delirium protocol (if indicated): No VAP protocol (if indicated): Not indicated DVT prophylaxis: SCD and Contraindicated  GI prophylaxis: PPI Glucose control:  SSI Yes Central venous access:  Yes, and it is still needed Arterial line:  Yes, and it is still needed  Foley:  N/A Mobility:  bed rest  PT consulted: Yes Last date of multidisciplinary goals of care discussion [patient updated on plan of care; no family at bedside] Code Status:  full code Disposition: per primary   This patient is critically ill with multiple organ system failure which requires frequent high complexity decision making, assessment, support, evaluation, and titration of therapies. This was completed through the application of advanced monitoring technologies and extensive interpretation of multiple databases. During this encounter critical care time was devoted to patient care services described in this note for 35 minutes.  JD Rexene Agent [facility] 03/29/2021, 9:28 AM  Please see [email or web address] for pager details.  From 7A-7P if no response, please call [phone]. After hours, please call ELink [phone].

## 2021-03-29 NOTE — Anesthesia Postprocedure Evaluation (Signed)
Anesthesia Post Note  Patient: David Rose.  Procedure(s) Performed: EXPLORATION POST OPERATIVE OPEN HEART (N/A Chest)     Patient location during evaluation: SICU Anesthesia Type: General Level of consciousness: sedated Pain management: pain level controlled Vital Signs Assessment: post-procedure vital signs reviewed and stable Respiratory status: patient remains intubated per anesthesia plan Cardiovascular status: stable Postop Assessment: no apparent nausea or vomiting Anesthetic complications: no   No complications documented.  Last Vitals:  Vitals:   03/29/21 2045 03/29/21 2100  BP:  (!) 93/54  Pulse: 91 93  Resp: 13 12  Temp:    SpO2: 96% 97%    Last Pain:  Vitals:   03/29/21 2100  TempSrc:   PainSc: 3                  Nancy Arvin

## 2021-03-29 NOTE — Progress Notes (Signed)
5 Days Post-Op Procedure(s) (LRB): EXPLORATION POST OPERATIVE OPEN HEART (N/A) Subjective: nausea  Objective: Vital signs in last 24 hours: Temp:  [98.1 F (36.7 C)-98.8 F (37.1 C)] 98.7 F (37.1 C) (06/06 0739) Pulse Rate:  [103-108] 107 (06/06 0700) Cardiac Rhythm: Sinus tachycardia (06/06 0400) Resp:  [11-44] 24 (06/06 0700) BP: (96-140)/(50-82) 114/58 (06/06 0700) SpO2:  [89 %-100 %] 99 % (06/06 0700) Arterial Line BP: (83-135)/(39-61) 103/42 (06/06 0700)  Hemodynamic parameters for last 24 hours: PAP: (63-68)/(24) 63/24 CVP:  [15 mmHg-28 mmHg] 15 mmHg  Intake/Output from previous day: 06/05 0701 - 06/06 0700 In: 3199.5 [P.O.:600; I.V.:1121.3; NG/GT:195; IV Piggyback:1283.2] Out: [id number] [Chest Tube:510] Intake/Output this shift: No intake/output data recorded.  General appearance: alert and cooperative Neurologic: intact Heart: tachycardiac Lungs: clear to auscultation bilaterally Abdomen: soft, non-tender; bowel sounds normal; no masses,  no organomegaly Extremities: edema mild Wound: c/d/i  Lab Results: Recent Labs    03/28/21 1620 03/29/21 0121 03/29/21 0325  WBC 18.9*  --  17.5*  HGB 9.2*  --  9.4*  HCT 29.1*  --  30.2*  PLT 70*  71* 91* 91*  92*   BMET:  Recent Labs    03/28/21 1620 03/29/21 0325  NA 132* 134*  K 5.2* 5.0  CL 99 101  CO2 26 25  GLUCOSE 174* 132*  BUN 18 17  CREATININE 1.48* 1.53*  CALCIUM 7.4* 7.7*    PT/INR:  Recent Labs    03/29/21 0325  LABPROT 16.6*  INR 1.3*   ABG    Component Value Date/Time   PHART 7.453 (H) 04/20/2021 1402   HCO3 19.4 (L) 04/07/2021 1402   TCO2 20 (L) 04/13/2021 1402   ACIDBASEDEF 4.0 (H) 03/31/2021 1402   O2SAT 93.3 03/29/2021 0325   CBG (last 3)  Recent Labs    03/28/21 1624 03/28/21 2025 03/28/21 2340  GLUCAP 163* 94 98    Assessment/Plan: S/P Procedure(s) (LRB): EXPLORATION POST OPERATIVE OPEN HEART (N/A) Mobilize suggest changing right IJ catheter for tri-alysis catheter  and removing fem venous line  Remove chest tubes if he can get oob today PICC line   LOS: 14 days    David Rose 03/29/2021

## 2021-03-30 ENCOUNTER — Inpatient Hospital Stay (HOSPITAL_COMMUNITY): Payer: Medicare Other

## 2021-03-30 LAB — GLUCOSE, CAPILLARY
Glucose-Capillary: 106 mg/dL — ABNORMAL HIGH (ref 70–99)
Glucose-Capillary: 129 mg/dL — ABNORMAL HIGH (ref 70–99)
Glucose-Capillary: 202 mg/dL — ABNORMAL HIGH (ref 70–99)
Glucose-Capillary: 211 mg/dL — ABNORMAL HIGH (ref 70–99)
Glucose-Capillary: 113 mg/dL — ABNORMAL HIGH (ref 70–99)

## 2021-03-30 LAB — PHOSPHORUS: Phosphorus: 2.6 mg/dL (ref 2.5–4.6)

## 2021-03-30 LAB — CBC
HCT: 31.1 % — ABNORMAL LOW (ref 39.0–52.0)
Hemoglobin: 9.8 g/dL — ABNORMAL LOW (ref 13.0–17.0)
MCH: 30.2 pg (ref 26.0–34.0)
MCHC: 31.5 g/dL (ref 30.0–36.0)
MCV: 96 fL (ref 80.0–100.0)
Platelets: 151 10*3/uL (ref 150–400)
RBC: 3.24 MIL/uL — ABNORMAL LOW (ref 4.22–5.81)
RDW: 19.1 % — ABNORMAL HIGH (ref 11.5–15.5)
WBC: 13.7 10*3/uL — ABNORMAL HIGH (ref 4.0–10.5)
nRBC: 0.3 % — ABNORMAL HIGH (ref 0.0–0.2)

## 2021-03-30 LAB — COMPREHENSIVE METABOLIC PANEL
ALT: 15 U/L (ref 0–44)
AST: 40 U/L (ref 15–41)
Albumin: 2.6 g/dL — ABNORMAL LOW (ref 3.5–5.0)
Alkaline Phosphatase: 177 U/L — ABNORMAL HIGH (ref 38–126)
Anion gap: 6 (ref 5–15)
BUN: 20 mg/dL (ref 6–20)
CO2: 25 mmol/L (ref 22–32)
Calcium: 7.8 mg/dL — ABNORMAL LOW (ref 8.9–10.3)
Chloride: 102 mmol/L (ref 98–111)
Creatinine, Ser: 1.73 mg/dL — ABNORMAL HIGH (ref 0.61–1.24)
GFR, Estimated: 46 mL/min — ABNORMAL LOW (ref >60)
Glucose, Bld: 117 mg/dL — ABNORMAL HIGH (ref 70–99)
Potassium: 4.6 mmol/L (ref 3.5–5.1)
Sodium: 133 mmol/L — ABNORMAL LOW (ref 135–145)
Total Bilirubin: 2.8 mg/dL — ABNORMAL HIGH (ref 0.3–1.2)
Total Protein: 5.9 g/dL — ABNORMAL LOW (ref 6.5–8.1)

## 2021-03-30 LAB — FIBRINOGEN: Fibrinogen: 455 mg/dL (ref 210–475)

## 2021-03-30 LAB — MAGNESIUM: Magnesium: 2.4 mg/dL (ref 1.7–2.4)

## 2021-03-30 LAB — COOXEMETRY PANEL
Carboxyhemoglobin: 2.1 % — ABNORMAL HIGH (ref 0.5–1.5)
Methemoglobin: 0.8 % (ref 0.0–1.5)
O2 Saturation: 79 %
Total hemoglobin: 9.7 g/dL — ABNORMAL LOW (ref 12.0–16.0)

## 2021-03-30 LAB — PROTIME-INR
INR: 1.4 — ABNORMAL HIGH (ref 0.8–1.2)
Prothrombin Time: 17.3 seconds — ABNORMAL HIGH (ref 11.4–15.2)

## 2021-03-30 MED ORDER — HEPARIN (PORCINE) 25000 UT/250ML-% IV SOLN: 1000.0000 [IU]/h | INTRAVENOUS | Status: DC

## 2021-03-30 MED ORDER — LACTULOSE 10 GM/15ML PO SOLN
10.0000 g | Freq: Three times a day (TID) | ORAL | Status: AC
  Administered 2021-03-30 (×3): 10 g via ORAL
  Filled 2021-03-30 (×3): qty 15

## 2021-03-30 MED ORDER — SODIUM CHLORIDE 0.9 % IV SOLN
1.0000 g | INTRAVENOUS | Status: DC
  Administered 2021-03-31 – 2021-04-01 (×2): 1 g via INTRAVENOUS
  Filled 2021-03-30 (×3): qty 1

## 2021-03-30 MED ORDER — SODIUM CHLORIDE 0.9% FLUSH
10.0000 mL | Freq: Three times a day (TID) | INTRAVENOUS | Status: DC
  Administered 2021-03-30 – 2021-04-02 (×9): 10 mL

## 2021-03-30 MED ORDER — MIDODRINE HCL 5 MG PO TABS
5.0000 mg | ORAL_TABLET | Freq: Once | ORAL | Status: AC
  Administered 2021-03-30: 5 mg via ORAL
  Filled 2021-03-30: qty 1

## 2021-03-30 MED ORDER — MIDODRINE HCL 5 MG PO TABS
15.0000 mg | ORAL_TABLET | Freq: Three times a day (TID) | ORAL | Status: DC
  Administered 2021-03-30 – 2021-04-02 (×8): 15 mg via ORAL
  Filled 2021-03-30 (×8): qty 3

## 2021-03-30 MED ORDER — ENSURE ENLIVE PO LIQD
237.0000 mL | Freq: Three times a day (TID) | ORAL | Status: DC
  Administered 2021-03-30 – 2021-04-01 (×5): 237 mL via ORAL

## 2021-03-30 MED ORDER — HEPARIN (PORCINE) 25000 UT/250ML-% IV SOLN
1900.0000 [IU]/h | INTRAVENOUS | Status: DC
  Administered 2021-03-30: 1000 [IU]/h via INTRAVENOUS
  Administered 2021-03-31: 1350 [IU]/h via INTRAVENOUS
  Administered 2021-04-01: 1900 [IU]/h via INTRAVENOUS
  Filled 2021-03-30 (×3): qty 250

## 2021-03-30 MED FILL — Heparin Sodium (Porcine) Inj 1000 Unit/ML: INTRAMUSCULAR | Qty: 30 | Status: AC

## 2021-03-30 MED FILL — Potassium Chloride Inj 2 mEq/ML: INTRAVENOUS | Qty: 40 | Status: AC

## 2021-03-30 MED FILL — Magnesium Sulfate Inj 50%: INTRAMUSCULAR | Qty: 10 | Status: AC

## 2021-03-30 NOTE — Op Note (Signed)
Procedure(s): EXPLORATION POST OPERATIVE OPEN HEART Procedure Note  David Rose. male 55 y.o. 03/30/2021  Procedure(s) and Anesthesia Type:    * EXPLORATION POST OPERATIVE OPEN HEART - General  Surgeon(s) and Role:    Wonda Olds, MD - Primary   Indications: The patient is status post CABG from the prior day.  He initially was coagulopathic but then upon resuscitation his bleeding subsided.  He was extubated and an hour or 2 after extubation had a respiratory code.  After receiving chest compressions and being reintubated, he had increased chest tube output once again.  He is taken the operating room for exploration hemodynamically stable.     Surgeon: Wonda Olds   Assistants: Staff  Anesthesia: General endotracheal anesthesia  ASA Class: 4    Procedure Detail  EXPLORATION POST OPERATIVE OPEN HEART The patient is taken the operating room urgently.  Communications were made with the patient's family but official consent was not obtained due to the urgency of the procedure.  He is placed in the supine position.  Anesthesia was confirmed.  The anterior chest was cleansed and draped in a sterile field.  A preop surgical pause was performed.  The prior sternotomy incision was reopened.  There was a large amount of clot encountered in the anterior mediastinum and around the heart.  This was evacuated free.  A sternal retractor was placed.  There was bleeding coming from the distal aspect of the LAD anastomosis.  This was fixed with one 7-0 Prolene suture.  Significant resuscitation was required of the patient on the part of anesthesia.  After ensuring hemostasis throughout the chest the sternum was reapproximated over drains as usual.  This sterile dressings were applied to the closed incision.  All sponge instruments and needle counts were correct.  Estimated Blood Loss:  200 mL         Drains: 3 Bard drains   Blood Given: Per anesthesia record         Specimens:  None         Implants: none        Complications:  * No complications entered in OR log *         Disposition: ICU - intubated and critically ill.         Condition: stable

## 2021-03-30 NOTE — Progress Notes (Signed)
NAME:  David Rose., MRN:  [id number], DOB:  1966-07-25, LOS: 56 ADMISSION DATE:  03/03/2021, CONSULTATION DATE:  04/20/2021 REFERRING MD:  Verl Blalock, CHIEF COMPLAINT:  code   History of Present Illness:  55 year old man w/ hx of DM2, HTN, PAD, ESRD, cirrhosis (? Cardiac) who presented for symptomatic ischemic cardiomyopathy.  Underwent pre-op optimization with CHF, GI, and nephrology teams then high risk CABG 5/31. On  5/31-6/1 David Rose unfortunately developed respiratory distress followed by PEA and chest compressions. This caused increased bleeding around surgical sites, underwent mediastinal exploration this AM with some bleeding around mammary graft site and diffuse oozing.  Received multiple units of blood products overnight.  He is intubated, on pressors.  PCCM asked to assist with management.  Pertinent  Medical History  ESRD on HD Cardiac cirrhosis with portal hypertension IBS IDDM  Significant Hospital Events: Including procedures, antibiotic start and stop dates in addition to other pertinent events   . 5/23 admitted . 5/26 paracentesis 4L . 5/30 HD . 5/31 CABG . 5/31-6/1 code, OR for mediastinal exploration 2/2 hemorrhagic shock induced by CPR. Factor 7, protamine, FFP, cryo given . 6/1 PCCM consult, 4u prbc and 1 plt . R pigtail chest tube placed due to limitation of vent weaning. . 6/2 extubated, chest tube 3.2L out past 24 hrs . 6/3 remains on 2L [place], chest tube with 500cc out/ 24 hrs . 6/4 remains on CRRT, 2L [place], milrinone, NE. 3 chest tubes remain. . 6/6: remains on CRRT, 4 chest tubes in place  Interim History / Subjective:   Patient sitting up in bed eating breakfast, no complaints CRRT running: UF 100 Chest tubes in place: 250 cc output in last 24 hours, decreased from yesterday Midodrine increased to 15, Levo at 7 Amio running  Objective   Blood pressure (!) 105/53, pulse (!) 101, temperature 97.6 F (36.4 C), temperature source Oral, resp. rate 16, height 6'  2" (1.88 m), weight 78.9 kg, SpO2 96 %. 4LNC CVP:  [10 mmHg-14 mmHg] 10 mmHg      Intake/Output Summary (Last 24 hours) at 03/30/2021 0914 Last data filed at 03/30/2021 0800 Gross per 24 hour  Intake 2383.04 ml  Output 4494 ml  Net -2110.96 ml   Filed Weights   03/27/21 0715 03/28/21 0500 03/30/21 0500  Weight: 80.5 kg 80.5 kg 78.9 kg    Examination: General: pale appearing male sitting up in bed. CRRT running HEENT: MM pink/moist; RIJ central line in place Neuro: AOx3 CV: s1s2, no m/r/g; flutter rhythm appreciated on telemetry PULM:  Dim bs bilaterally; on RA; Large bore chest tubes bilaterally with R pigtail GI: soft, bsx4 active  Extremities: warm/dry, pedal edema bilaterally; R femoral HD cath without erythema Skin: no rashes or lesions  Labs/imaging that I havepersonally reviewed  (right click and "Reselect all SmartList Selections" daily)   K 4.6, Mag 2.4 Creatinine 1.73 from 1.53, BUN 20  Alk phosph 177 Total bili 2.8 WBC 13.7 trending down hgb 9.8 Platelets 151 from 91   Resolved Hospital Problem list   Acute Encephalopathy  Assessment & Plan:   Acute hypoxemic respiratory failure- improved Bilateral pleural effusions; 2 surgical chest tubes and inferior R sided pigtail tube in place Likely LLL HAP P: -CTS following: plans to remove large bore CT -Will keep R pig tail in place and monitor CT output for questionable hepatic hydrothorax -CXR am -off O2; sat goals > 92% -Continue Cefepime for HAP -Aggressive pulmonary toiletry and OOB with PT/OT  Post-op mediastinal hemorrhage Cirrhotic and hemorrhage- induced consumptive coagulopathy Hemorrhagic shock, resolved Acute blood loss anemia Thrombocytopenia- in setting of blood loss, coagulopathy, and cirrhosis P: -Trend CBC and INR -transfuse for hgb <7 -HIT panel and SRA pending since 6/2: platelets improving  In hospital cardiac arrest precipitated by respiratory distress- PEA, minimal downtime. 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 -renal dose meds  DM2, controlled P: -SSI and CBG monitoring  Best practice (right click and "Reselect all SmartList Selections" daily)  Diet:  Oral Pain/Anxiety/Delirium protocol (if indicated): No VAP protocol (if indicated): Not indicated DVT prophylaxis: Systemic AC  GI prophylaxis: PPI Glucose control:  SSI Yes Central venous access:  Yes, and it is still needed Arterial line:  Yes, and it is still needed  Foley:  N/A Mobility:  bed rest  PT consulted: Yes Last date of multidisciplinary goals of care discussion [patient updated on plan of care on 6/7; no family at bedside] Code Status:  full code Disposition: per primary   This patient is critically ill with multiple organ system failure which requires frequent high complexity decision making, assessment, support, evaluation, and titration of therapies. This was completed through the application of advanced monitoring technologies and extensive interpretation of multiple databases. During this encounter critical care time was devoted to patient care services described in this note for 35 minutes.  JD Rexene Agent [facility] 03/30/2021, 9:14 AM  Please see [email or web address] for pager details.  From 7A-7P if no response, please call [phone]. After hours, please call ELink [phone].

## 2021-03-30 NOTE — Progress Notes (Signed)
6 Days Post-Op Procedure(s) (LRB): EXPLORATION POST OPERATIVE OPEN HEART (N/A) Subjective: Feeling better  Objective: Vital signs in last 24 hours: Temp:  [97.6 F (36.4 C)-98.4 F (36.9 C)] 97.6 F (36.4 C) (06/07 0700) Pulse Rate:  [86-104] 100 (06/07 1600) Cardiac Rhythm: Normal sinus rhythm (06/07 1200) Resp:  [11-25] 16 (06/07 1600) BP: (93-114)/(47-62) 93/50 (06/07 1600) SpO2:  [93 %-100 %] 100 % (06/07 1600) Arterial Line BP: (85-131)/(36-48) 110/40 (06/07 1200) Weight:  [78.9 kg] 78.9 kg (06/07 0500)  Hemodynamic parameters for last 24 hours: CVP:  [10 mmHg-14 mmHg] 10 mmHg  Intake/Output from previous day: 06/06 0701 - 06/07 0700 In: 2246.8 [P.O.:882; I.V.:907.9; IV Piggyback:456.9] Out: 4580 [Urine:100; Emesis/NG output:200; Blood:80; Chest Tube:250] Intake/Output this shift: Total I/O In: 1336.5 [P.O.:960; I.V.:276.5; IV Piggyback:100] Out: 982 [Other:982]  General appearance: alert and cooperative Neurologic: intact Heart: regular rate and rhythm, S1, S2 normal, no murmur, click, rub or gallop Lungs: clear to auscultation bilaterally Abdomen: soft, non-tender; bowel sounds normal; no masses,  no organomegaly Extremities: edema mild Wound: c/d/i  Lab Results: Recent Labs    03/29/21 0325 03/30/21 0414  WBC 17.5* 13.7*  HGB 9.4* 9.8*  HCT 30.2* 31.1*  PLT 91*  92* 151   BMET:  Recent Labs    03/29/21 1533 03/30/21 0414  NA 133* 133*  K 4.8 4.6  CL 99 102  CO2 23 25  GLUCOSE 209* 117*  BUN 18 20  CREATININE 1.61* 1.73*  CALCIUM 7.7* 7.8*    PT/INR:  Recent Labs    03/30/21 0414  LABPROT 17.3*  INR 1.4*   ABG    Component Value Date/Time   PHART 7.453 (H) 03/30/2021 1402   HCO3 19.4 (L) 04/14/2021 1402   TCO2 20 (L) 03/31/2021 1402   ACIDBASEDEF 4.0 (H) 04/07/2021 1402   O2SAT 79.0 03/30/2021 0434   CBG (last 3)  Recent Labs    03/30/21 0705 03/30/21 1122 03/30/21 1639  GLUCAP 129* 202* 211*    Assessment/Plan: S/P  Procedure(s) (LRB): EXPLORATION POST OPERATIVE OPEN HEART (N/A) HD per nephrology  Chest tubes out PT/OT   LOS: 15 days    Wonda Olds 03/30/2021

## 2021-03-30 NOTE — Progress Notes (Signed)
Physical Therapy Treatment Patient Details Name: David Rose. MRN: [id number] DOB: 1966/03/16 Today's Date: 03/30/2021    History of Present Illness 55 yo admitted 5/23 for heart failure optimization prior to CABGx 4 performed on 5/31. 5/26 paracentesis. Pt extubated 6/1 but later had PEA arrest with intubation and return to OR for exploration with chest tube placed and extubated 6/2. CRRT started 6/1. PMhx: CAD, ESRD, pulmonary HTN, CHF, anemia, cirrhosis, DM, finger amputation    PT Comments    Pt pleasant in full chair position on arrival with clearance for mobility per Dr.Clark. pt able to stand and perform marching but too fatigued to complete additional trials. Anticipate pt will progress to gait once fem line out. Pt with SpO2 92-100% on 0.5 L and on RA sats 95%. Educated for sternal precautions with pt able to recall 2 end of session and reinforced with education for progression. Will continue to follow.     Follow Up Recommendations  CIR     Equipment Recommendations  Rolling walker with 5" wheels    Recommendations for Other Services       Precautions / Restrictions Precautions Precautions: Fall;Sternal;Other (comment) Precaution Comments: CRRT left fem, Swan, external pacer, chest tubes, Restrictions Weight Bearing Restrictions: Yes    Mobility  Bed Mobility               General bed mobility comments: pt sitting in chair position on arrival and end of session    Transfers Overall transfer level: Needs assistance   Transfers: Sit to/from Stand Sit to Stand: Mod assist;+2 safety/equipment;+2 physical assistance         General transfer comment: mod +2 assist to rise from surface with cues for hand placement and use of pad to craddle sacrum to stand. pt able to stand grossly 3 min and perform marching  Ambulation/Gait             General Gait Details: not yet ready   Stairs             Wheelchair Mobility    Modified Rankin  (Stroke Patients Only)       Balance Overall balance assessment: Needs assistance Sitting-balance support: No upper extremity supported Sitting balance-Leahy Scale: Fair Sitting balance - Comments: EOB with guarding   Standing balance support: Bilateral upper extremity supported Standing balance-Leahy Scale: Poor Standing balance comment: bil Ue support on RW in standing                            Cognition Arousal/Alertness: Awake/alert Behavior During Therapy: WFL for tasks assessed/performed Overall Cognitive Status: Within Functional Limits for tasks assessed                                        Exercises General Exercises - Lower Extremity Long Arc Quad: AROM;Both;Seated;10 reps    General Comments        Pertinent Vitals/Pain Pain Assessment: No/denies pain    Home Living                      Prior Function            PT Goals (current goals can now be found in the care plan section) Progress towards PT goals: Progressing toward goals    Frequency    Min 3X/week  PT Plan Current plan remains appropriate    Co-evaluation              AM-PAC PT "6 Clicks" Mobility   Outcome Measure  Help needed turning from your back to your side while in a flat bed without using bedrails?: A Lot Help needed moving from lying on your back to sitting on the side of a flat bed without using bedrails?: A Lot Help needed moving to and from a bed to a chair (including a wheelchair)?: A Lot Help needed standing up from a chair using your arms (e.g., wheelchair or bedside chair)?: A Lot Help needed to walk in hospital room?: Total Help needed climbing 3-5 steps with a railing? : Total 6 Click Score: 10    End of Session   Activity Tolerance: Patient tolerated treatment well Patient left: in bed;with call bell/phone within reach;with nursing/sitter in room;with family/visitor present Nurse Communication: Mobility  status;Precautions PT Visit Diagnosis: Other abnormalities of gait and mobility (R26.89);Muscle weakness (generalized) (M62.81);Difficulty in walking, not elsewhere classified (R26.2)     Time: [id number] PT Time Calculation (min) (ACUTE ONLY): 22 min  Charges:  $Therapeutic Activity: 8-22 mins                     [name], PT Acute Rehabilitation Services Pager: [phone] Office: [place] [facility] [place] 03/30/2021, 9:40 AM

## 2021-03-30 NOTE — Progress Notes (Signed)
Femoral Catheter Removal   Catheter removed at 1230, manual pressure held for 20 min.  VS stable throughout. Patient educated.    Kathleene Hazel RN

## 2021-03-30 NOTE — Progress Notes (Signed)
Patient ID: David Marinez., male   DOB: [birth year], 55 y.o.   MRN: [id number] S: Working with PT this morning. O:BP (!) 113/59   Pulse 100   Temp 97.6 F (36.4 C) (Oral)   Resp (!) 21   Ht '6\' 2"'$  (1.88 m)   Wt 78.9 kg   SpO2 100%   BMI 22.33 kg/m   Intake/Output Summary (Last 24 hours) at 03/30/2021 1159 Last data filed at 03/30/2021 1100 Gross per 24 hour  Intake 2647.91 ml  Output 4806 ml  Net -2158.09 ml   Intake/Output: I/O last 3 completed shifts: In: 2917.1 [P.O.:882; I.V.:1478.2; IV Piggyback:556.9] Out: [id number] [Urine:100; Emesis/NG output:200; [id number]; Blood:80; Chest Tube:370]  Intake/Output this shift:  Total I/O In: 945.2 [P.O.:720; I.V.:125.2; IV Piggyback:100] Out: 97 [Other:782] Weight change:  Gen: frail, NAD CVS: tachy at 100 Resp: cta Abd: +BS, soft, NT Ext: no edema, RUE AVF +T/B  Recent Labs  Lab 04/19/2021 0714 03/30/2021 0720 03/25/21 0442 03/25/21 1523 03/26/21 0442 03/26/21 1632 03/27/21 0509 03/27/21 1614 03/28/21 0405 03/28/21 1620 03/29/21 0325 03/29/21 1533 03/30/21 0414  NA 142   < > 138   < > 134* 137 135 135 134* 132* 134* 133* 133*  K 3.5   < > 3.9   < > 4.7 4.6 4.6 4.6 4.7 5.2* 5.0 4.8 4.6  CL 103   < > 103   < > 101 103 100 102 103 99 101 99 102  CO2 18*   < > 24   < > '26 28 25 26 26 26 25 23 25  '$ GLUCOSE 280*   < > 141*   < > 162* 100* 153* 101* 165* 174* 132* 209* 117*  BUN 22*   < > 16   < > '11 11 14 15 17 18 17 18 20  '$ CREATININE 3.51*   < > 2.16*   < > 1.74* 1.65* 1.74* 1.69* 1.60* 1.48* 1.53* 1.61* 1.73*  ALBUMIN 2.2*   < > 3.1*   < > 3.2* 3.1* 3.0* 2.7* 2.6* 3.0* 2.9* 2.8* 2.6*  CALCIUM 8.0*   < > 8.6*   < > 8.0* 8.1* 8.0* 7.7* 7.7* 7.4* 7.7* 7.7* 7.8*  PHOS  --    < > 3.0   < > 3.5 3.2  --  1.9* 1.9* 2.8 2.2* 5.4* 2.6  AST 35  --  34  --  22  --  26  --  31  --  38  --  40  ALT 17  --  14  --  8  --  6  --  10  --  13  --  15   < > = values in this interval not displayed.   Liver Function Tests: Recent Labs  Lab  03/28/21 0405 03/28/21 1620 03/29/21 0325 03/29/21 1533 03/30/21 0414  AST 31  --  38  --  40  ALT 10  --  13  --  15  ALKPHOS 114  --  162*  --  177*  BILITOT 5.0*  --  4.7*  --  2.8*  PROT 5.5*  --  5.9*  --  5.9*  ALBUMIN 2.6*   < > 2.9* 2.8* 2.6*   < > = values in this interval not displayed.   No results for input(s): LIPASE, AMYLASE in the last 168 hours. No results for input(s): AMMONIA in the last 168 hours. CBC: Recent Labs  Lab 03/27/21 1614 03/28/21 0000 03/28/21 0405 03/28/21 0537 03/28/21  1620 03/29/21 0121 03/29/21 0325 03/30/21 0414  WBC 23.2*  --  22.0*  --  18.9*  --  17.5* 13.7*  HGB 9.8*  --  9.6*  --  9.2*  --  9.4* 9.8*  HCT 29.8*  --  30.4*  --  29.1*  --  30.2* 31.1*  MCV 93.7  --  96.2  --  96.7  --  97.4 96.0  PLT 57*  57*   < > 66*   < > 70*  71* 91* 91*  92* 151   < > = values in this interval not displayed.   Cardiac Enzymes: No results for input(s): CKTOTAL, CKMB, CKMBINDEX, TROPONINI in the last 168 hours. CBG: Recent Labs  Lab 03/29/21 1944 03/29/21 2338 03/30/21 0428 03/30/21 0705 03/30/21 1122  GLUCAP 156* 208* 106* 129* 202*    Iron Studies: No results for input(s): IRON, TIBC, TRANSFERRIN, FERRITIN in the last 72 hours. Studies/Results: DG Chest Port 1 View  Result Date: 03/30/2021 CLINICAL DATA:  Chest tube. EXAM: PORTABLE CHEST 1 VIEW COMPARISON:  03/28/2021. FINDINGS: Right IJ sheath, mediastinal drainage catheter, bilateral chest tubes in stable position. Prior CABG. Cardiomegaly. Left lung infiltrate again noted. Small left pleural effusion again noted. Previously identified tiny right pneumothorax no longer identified. Tiny left apical pneumothorax cannot be completely excluded on today's exam. IMPRESSION: 1. Lines and tubes including bilateral chest tubes in stable position. Previously identified tiny right pneumothorax no longer identified. Tiny left apical pneumothorax cannot be completely excluded on today's exam. 2.   Prior CABG.  Stable cardiomegaly. 3. Left lung infiltrate again noted. Small left pleural effusion again noted. Electronically Signed   By: Marcello Moores  Register   On: 03/30/2021 06:54   . (feeding supplement) PROSource Plus  30 mL Oral TID BM  . sodium chloride   Intravenous Once  . sodium chloride   Intravenous Once  . sodium chloride   Intravenous Once  . sodium chloride   Intravenous Once  . aspirin EC  81 mg Oral Daily  . atorvastatin  40 mg Oral Daily  . B-complex with vitamin C  1 tablet Oral Daily  . bisacodyl  10 mg Oral Daily   Or  . bisacodyl  10 mg Rectal Daily  . Chlorhexidine Gluconate Cloth  6 each Topical Daily  . cinacalcet  30 mg Oral Q T,Th,Sa-HD  . [START ON 04/01/2021] darbepoetin (ARANESP) injection - NON-DIALYSIS  60 mcg Subcutaneous Q Thu-1800  . docusate sodium  200 mg Oral Daily  . feeding supplement  1 Container Oral TID BM  . feeding supplement  237 mL Oral TID BM  . insulin aspart  0-24 Units Subcutaneous Q4H  . lactulose  10 g Oral TID  . mouth rinse  15 mL Mouth Rinse BID  . midodrine  15 mg Oral TID WC  . pantoprazole  40 mg Oral Daily  . polyethylene glycol  17 g Oral Daily  . sildenafil  20 mg Oral TID  . sodium chloride flush  10 mL Intracatheter Q8H  . sodium chloride flush  10-40 mL Intracatheter Q12H  . sodium chloride flush  3 mL Intravenous Q12H    BMET    Component Value Date/Time   NA 133 (L) 03/30/2021 0414   K 4.6 03/30/2021 0414   CL 102 03/30/2021 0414   CO2 25 03/30/2021 0414   GLUCOSE 117 (H) 03/30/2021 0414   BUN 20 03/30/2021 0414   CREATININE 1.73 (H) 03/30/2021 0414   CALCIUM 7.8 (  L) 03/30/2021 0414   GFRNONAA 46 (L) 03/30/2021 0414   CBC    Component Value Date/Time   WBC 13.7 (H) 03/30/2021 0414   RBC 3.24 (L) 03/30/2021 0414   HGB 9.8 (L) 03/30/2021 0414   HCT 31.1 (L) 03/30/2021 0414   PLT 151 03/30/2021 0414   MCV 96.0 03/30/2021 0414   MCH 30.2 03/30/2021 0414   MCHC 31.5 03/30/2021 0414   RDW 19.1 (H)  03/30/2021 0414   LYMPHSABS 1.5 03/21/2021 1545   MONOABS 0.7 03/13/2021 1545   EOSABS 0.6 (H) 02/21/2021 1545   BASOSABS 0.1 03/09/2021 1545   [place] [facility] [phone] Use AVF; graft on inner arm  180 dialyzer 2K/2.5 Ca DF 500 BF 400 4 hours 15 g Heparin loading dose 2000 and hourly dose of 1200 EDW 78.5 kg Last post weight 78 kg on 5/21 (went in at 78 and came out at 78 kg) Meds: epogen 2400 units each tx venofer 50 mg weekly  Not on hectorol or calcitriol sensipar 30 mg three times a week with HD  Assessment/Plan:  1. ESRD - normally [place] dialysis in [place], last HD 5/30, now on CRRT since 04/09/2021 following CABG complicated by PEA arrest and need for pressors.  Still remains on pressors however he is feeling better.   1. Will stop CRRT today and hope to transition to IHD tomorrow or Thursday (to keep him on his outpatient schedule).   2. Will pull femoral HD catheter today after CRRT is stopped and use AVF for IHD. 2. CAD s/p CABG x 4 complicated by PEA arrest.  Currently stable 3. Acute on chronic CHF with cardiogenic shock post CABG - currently on levophed, milrinone, and started on midodrine.  Ok to increase midodrine to 10 mg tid and wean levophed as tolerated. 4. Pulmonary HTN - per HF team 5. Cirrhosis - presumably cardiogenic.  S/p paracentesis on 03/18/21. 6. Hyperkalemia - stable 7. Anemia of ESRD - on ESA and transfuse prn.  8. Hypophosphatemia - repleted with IV phos and follow.    Donetta Potts, MD [employer] [phone]

## 2021-03-30 NOTE — Progress Notes (Addendum)
Patient ID: David Ikerd., male   DOB: [birth year], 54 y.o.   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  Based on low EF and elevated PA pressure, right and left heart cath was done in 5/22.  This showed severe 3VD, high cardiac output with moderate pulmonary hypertension but low PVR.  He is planned for CABG, but admitted pre-op for optimization.    Subjective:    CABG x 4 on 5/31 with LIMA-LAD, SVG-RCA, SVG-OM, SVG-D.   Coagulopathic post-op with multiple blood products.  Extubated post-op but developed respiratory distress => PEA arrest then VT. Reintubated.  ROSC with ACLS.  Returned to OR for mediastinal exploration 6/1.  Extubated 6/2.  6/6 Midodrine started.   Currently on NE 7 +  amiodarone 30.  Co-ox 79%  CVVH with UF running, aimed for about 50 cc/hr  HIT sent 6/2 (pending) -->Plts 47 => 51 => 68=>91=>191  . CT drainage continues to slow.      Feeling better. Denies SOB  Objective:   Weight Range: 78.9 kg Body mass index is 22.33 kg/m.   Vital Signs:   Temp:  [97.6 F (36.4 C)-98.7 F (37.1 C)] 97.6 F (36.4 C) (06/07 0700) Pulse Rate:  [86-107] 99 (06/07 0700) Resp:  [11-66] 12 (06/07 0700) BP: (93-135)/(54-67) 101/62 (06/06 2200) SpO2:  [93 %-100 %] 95 % (06/07 0700) Arterial Line BP: (85-147)/(36-69) 96/38 (06/07 0700) Weight:  [78.9 kg] 78.9 kg (06/07 0500) Last BM Date: 03/03/2021  Weight change: Filed Weights   03/27/21 0715 03/28/21 0500 03/30/21 0500  Weight: 80.5 kg 80.5 kg 78.9 kg    Intake/Output:   Intake/Output Summary (Last 24 hours) at 03/30/2021 0723 Last data filed at 03/30/2021 0700 Gross per 24 hour  Intake 2246.78 ml  Output 4580 ml  Net -2333.22 ml       Physical Exam  CVP 3   Well appearing. No resp difficulty HEENT: normal Neck: supple. no JVD. Carotids 2+ bilat; no bruits. No lymphadenopathy or thryomegaly appreciated. RIJ  Cor: PMI nondisplaced. Tachy Regular rate & rhythm. No rubs, gallops or murmurs. Lungs: clear . CT Abdomen: soft, nontender, nondistended. No hepatosplenomegaly. No bruits or masses. Good bowel sounds. Extremities: no cyanosis, clubbing, rash, edema Neuro: alert & orientedx3, cranial nerves grossly intact. moves all 4 extremities w/o difficulty. Affect pleasant   Telemetry   Atrial Tach 100s   EKG    N/A  Labs    CBC Recent Labs    03/29/21 0325 03/30/21 0414  WBC 17.5* 13.7*  HGB 9.4* 9.8*  HCT 30.2* 31.1*  MCV 97.4 96.0  PLT 91*  92* [id number]   Basic Metabolic Panel Recent Labs    03/29/21 0325 03/29/21 1533 03/30/21 0414  NA 134* 133* 133*  K 5.0 4.8 4.6  CL 101 99 102  CO2 '25 23 25  '$ GLUCOSE 132* 209* 117*  BUN '17 18 20  '$ CREATININE 1.53* 1.61* 1.73*  CALCIUM 7.7* 7.7* 7.8*  MG 2.3  --  2.4  PHOS 2.2* 5.4* 2.6   Liver Function Tests Recent Labs    03/29/21 0325 03/29/21 1533 03/30/21 0414  AST 38  --  40  ALT 13  --  15  ALKPHOS 162*  --  177*  BILITOT 4.7*  --  2.8*  PROT 5.9*  --  5.9*  ALBUMIN 2.9* 2.8* 2.6*   No results for input(s): LIPASE, AMYLASE in the last 72 hours. Cardiac Enzymes No results for input(s): CKTOTAL, CKMB, CKMBINDEX, TROPONINI in the last 72 hours.  BNP: BNP (last 3 results) Recent Labs    02/28/2021 1545  BNP 1,127.8*    ProBNP (last 3 results) No results for input(s): PROBNP in the last 8760 hours.   D-Dimer Recent Labs    03/29/21 0121 03/29/21 0325  DDIMER 14.06* 13.93*   Hemoglobin A1C No results for input(s): HGBA1C in the last 72 hours. Fasting Lipid Panel Recent Labs    03/28/21 0405  TRIG 54   Thyroid Function Tests No results for input(s): TSH, T4TOTAL, T3FREE, THYROIDAB in the last 72 hours.  Invalid  input(s): FREET3  Other results:   Imaging    DG Chest Port 1 View  Result Date: 03/30/2021 CLINICAL DATA:  Chest tube. EXAM: PORTABLE CHEST 1 VIEW COMPARISON:  03/28/2021. FINDINGS: Right IJ sheath, mediastinal drainage catheter, bilateral chest tubes in stable position. Prior CABG. Cardiomegaly. Left lung infiltrate again noted. Small left pleural effusion again noted. Previously identified tiny right pneumothorax no longer identified. Tiny left apical pneumothorax cannot be completely excluded on today's exam. IMPRESSION: 1. Lines and tubes including bilateral chest tubes in stable position. Previously identified tiny right pneumothorax no longer identified. Tiny left apical pneumothorax cannot be completely excluded on today's exam. 2.  Prior CABG.  Stable cardiomegaly. 3. Left lung infiltrate again noted. Small left pleural effusion again noted. Electronically Signed   By: Marcello Moores  Register   On: 03/30/2021 06:54     Medications:     Scheduled Medications: . (feeding supplement) PROSource Plus  30 mL Oral TID BM  . sodium chloride   Intravenous Once  . sodium chloride   Intravenous Once  . sodium chloride   Intravenous Once  . sodium chloride   Intravenous Once  . aspirin EC  81 mg Oral Daily  . atorvastatin  40 mg Oral Daily  . B-complex with vitamin C  1 tablet Oral Daily  . bisacodyl  10 mg Oral Daily   Or  . bisacodyl  10 mg Rectal Daily  . Chlorhexidine Gluconate Cloth  6 each Topical Daily  . cinacalcet  30 mg Oral Q T,Th,Sa-HD  . [START ON 04/01/2021] darbepoetin (ARANESP) injection - NON-DIALYSIS  60 mcg Subcutaneous Q Thu-1800  . docusate sodium  200 mg Oral Daily  . feeding supplement  1 Container Oral TID BM  . insulin aspart  0-24 Units Subcutaneous Q4H  . mouth rinse  15 mL Mouth Rinse BID  . midodrine  10 mg Oral TID WC  . pantoprazole  40 mg Oral Daily  . polyethylene glycol  17 g Oral Daily  . sildenafil  20 mg Oral TID  . sodium chloride flush  10-40 mL  Intracatheter Q12H  . sodium chloride flush  3 mL Intravenous Q12H    Infusions: .  prismasol BGK 4/2.5 400 mL/hr at 03/29/21 1941  .  prismasol BGK 4/2.5 200 mL/hr at 03/29/21 2009  . sodium chloride    . sodium chloride Stopped (03/26/21 1410)  . amiodarone 30 mg/hr (03/30/21 0700)  . ceFEPime (MAXIPIME) IV Stopped (03/29/21 2239)  . lactated ringers    . lactated ringers Stopped (03/29/21 2242)  . lactated ringers 10 mL/hr at 03/30/21 0700  .  norepinephrine (LEVOPHED) Adult infusion 7 mcg/min (03/30/21 0700)  . prismasol BGK 4/2.5 1,500 mL/hr at 03/30/21 0701    PRN Medications: heparin, lactated ringers, lidocaine (PF), lip balm, metoprolol tartrate, morphine injection, ondansetron (ZOFRAN) IV, oxyCODONE, sodium chloride, sodium chloride flush, sodium chloride flush, traMADol     Assessment/Plan   1. CAD: Severe 3VD with decreased EF.  I reviewed films with Dr. Ellyn Hack, PCI would be possible but would be difficult/high risk with multiple lesions and heavy calcification. 5/31 LIMA-LAD, SVG-RCA, SVG-OM, SVG-D. - Continue ASA 81.  - Continue atorvastatin 40 mg daily.  - CT output slowing.  2. Acute/chronic HF with mid range EF => cardiogenic shock post-CABG: Suspect ischemic cardiomyopathy.  Echo 4/22 with EF 40-45% with moderately decreased RV systolic function and PASP 99, moderate TR. There was a prominent component of RV failure with RA pressure elevated out of proportion to PCWP (CVP/PCWP 0.875 on RHC) but PAPI adequate at 2.8. Post-op had PEA arrest then VT. Currently on NE 7.  CO-OX 79%. - CVP 3.  Continue midodrine 10 mg tid.  - Continue to wean NE.   - On CVVH, ?transition today to iHD.  3. ESRD: Suspected due to diabetes.  Volume up post-op with multiple blood products. - ?Transition to iHD today and remove groin line.  4. 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  The CO  was not markedly high.  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 Oxygen saturation was 99% on RA pre-op, so no evidence for hepatopulmonary syndrome.   Moderate PH currently.  - Continue sildenafil 20 mg tid  5. Cirrhosis: Noted on abdominal US from 2020.  H/o paracentesis.  Had workup at [facility] (though patient does not remember this), viral hepatitis labs negative.  They ended up think that the cirrhosis was cardiogenic.  Repeat abdominal US 5/23 w/ nodular hepatic parenchymal pattern with increased echogenicity consistent with the patient's known cirrhosis. No focal hepatic abnormality identified. Portal vein is patent. Moderate ascites. GI has seen, patient had 4L paracentesis on 5/26. 6. Coagulopathy: Post-op bleeding in setting of coagulopathy post-CABG in patient with cirrhosis.  Currently seems controlled.  Platelets dropped, HIT sent => plts recovered to 151, think drop may have been due to sepsis.  7. VT: Had after PEA arrest am 6/1, continue amiodarone gtt 30.  8. Anemia: ABLA.  Stable today. Hgb stable. .   9. Pleural effusions: Right effusion with chest tube, ?hepatic hydrothorax.  CT drainage slowing.     10. PEA arrest: Respiratory arrest post-extubation.  ROSC with CPR.  11. Acute hypoxemic respiratory failure:  Extubated 6/2 . On room air with stable sats.  12. Atrial fibrillation: Atypical atrial flutter post-op.   - Continue amio drip.  - Start heparin gtt today, discussed with CCM and do not think HIT.  - ECG to confirm rhythm.   13. ID: Cefepime for ?sepsis syndrome/HCAP.    Amy Clegg NP-C  03/30/2021 7:23 AM  Patient seen with NP, agree with the above note .  Patient on NE 7 this morning, weight down with CVP about 3.  Co-ox 79%.  Appears to be in atypical flutter still.   No complaints, breathing better.   General: NAD Neck: No JVD, no thyromegaly or thyroid nodule.  Lungs: Clear to auscultation bilaterally with normal respiratory effort. CV:  Nondisplaced PMI.  Heart regular S1/S2, no S3/S4, no murmur.  1+ foot edema.   Abdomen: Soft,  nontender, no hepatosplenomegaly, no distention.  Skin: Intact without lesions or rashes.  Neurologic: Alert and oriented x 3.  Psych: Normal affect. Extremities: No clubbing or cyanosis.  HEENT: Normal.   With recovery of platelets with treatment for sepsis/HCAP, suspect fall may have been inflammatory/sepsis-related.  HIT still pending.  Discussed with CCM, think we can start heparin gtt with flutter without bolus.  CT output now minimal.   Increase midodrine to 15 mg tid and wean NE further.  Continue sildenafil.   CVP and weight down.  Favor transition to [facility] and removing groin HD catheter.   Continue amiodarone, check ECG today for rhythm (suspect still atypical flutter).   Cefepime for HCAP per CCM.   CRITICAL CARE Performed by: Loralie Champagne  Total critical care time: 35 minutes  Critical care time was exclusive of separately billable procedures and treating other patients.  Critical care was necessary to treat or prevent imminent or life-threatening deterioration.  Critical care was time spent personally by me on the following activities: development of treatment plan with patient and/or surrogate as well as nursing, discussions with consultants, evaluation of patient's response to treatment, examination of patient, obtaining history from patient or surrogate, ordering and performing treatments and interventions, ordering and review of laboratory studies, ordering and review of radiographic studies, pulse oximetry and re-evaluation of patient's condition.  Loralie Champagne 03/30/2021 8:05 AM

## 2021-03-30 NOTE — Plan of Care (Signed)
  Problem: Clinical Measurements: Goal: Ability to maintain clinical measurements within normal limits will improve Outcome: Progressing Goal: Will remain free from infection Outcome: Progressing Goal: Diagnostic test results will improve Outcome: Progressing Goal: Respiratory complications will improve Outcome: Progressing Goal: Cardiovascular complication will be avoided Outcome: Progressing   Problem: Activity: Goal: Risk for activity intolerance will decrease Outcome: Progressing   Problem: Elimination: Goal: Will not experience complications related to bowel motility Outcome: Progressing   Problem: Pain Managment: Goal: General experience of comfort will improve Outcome: Progressing   Problem: Safety: Goal: Ability to remain free from injury will improve Outcome: Progressing   Problem: Skin Integrity: Goal: Risk for impaired skin integrity will decrease Outcome: Progressing

## 2021-03-30 NOTE — Progress Notes (Signed)
ANTICOAGULATION CONSULT NOTE - Initial Consult  Pharmacy Consult for heparin Indication: atrial fibrillation/flutter  Allergies  Allergen Reactions  . Shellfish-Derived Products Anaphylaxis  . Wasp Venom Protein Anaphylaxis  . Penicillins Other (See Comments)    Childhood reaction    Patient Measurements: Height: '6\' 2"'$  (188 cm) Weight: 78.9 kg (173 lb 15.1 oz) IBW/kg (Calculated) : 82.2 Heparin Dosing Weight: 79kg  Vital Signs: Temp: 97.6 F (36.4 C) (06/07 0700) Temp Source: Oral (06/07 0700) BP: 105/53 (06/07 0800) Pulse Rate: 101 (06/07 0800)  Labs: Recent Labs    03/28/21 1620 03/29/21 0121 03/29/21 0325 03/29/21 1533 03/30/21 0414  HGB 9.2*  --  9.4*  --  9.8*  HCT 29.1*  --  30.2*  --  31.1*  PLT 70*  71* 91* 91*  92*  --  151  APTT 45* 43* 43*  --   --   LABPROT 17.1* 16.3* 16.6*  --  17.3*  INR 1.4* 1.3* 1.3*  --  1.4*  CREATININE 1.48*  --  1.53* 1.61* 1.73*    Estimated Creatinine Clearance: 53.8 mL/min (A) (by C-G formula based on SCr of 1.73 mg/dL (H)).   Medical History: Past Medical History:  Diagnosis Date  . ESRD (end stage renal disease) ([place])   . Hypertension   . Peripheral arterial disease ([place])   . Type 2 diabetes mellitus [facility])    Assessment: 55 year old male with recent CABG now with postop afib/flutter. There was concern several days ago immediately post op for HIT as plt count dropped to 30s. Antibody was ordered and shows sent, actual lab/blood draw never actually sent out/received by lab although shows in process. Discussed with CCM/HF and ok with low dose heparin without bolusing and will disregard HIT antibody in process. Hgb has been stable in the 9s and plt count improved now to 151.   Initiation of heparin currently delayed due to new IV line needs to be placed. Rn to notify pharmacy once line is placed and heparin is started. Orders entered.   Goal of Therapy:  Heparin level 0.3-0.5  units/ml (no bolusing) Monitor  platelets by anticoagulation protocol: Yes   Plan:  Start heparin infusion at 1000 units/hr Check anti-Xa level in 8 hours and daily while on heparin Continue to monitor H&H and platelets  Erin Hearing PharmD., BCPS Clinical Pharmacist 03/30/2021 8:13 AM

## 2021-03-31 ENCOUNTER — Inpatient Hospital Stay (HOSPITAL_COMMUNITY): Payer: Medicare Other

## 2021-03-31 DIAGNOSIS — R5381 Other malaise: Secondary | ICD-10-CM

## 2021-03-31 LAB — HEPARIN LEVEL (UNFRACTIONATED)
Heparin Unfractionated: <0.1 IU/mL — ABNORMAL LOW (ref 0.30–0.70)
Heparin Unfractionated: <0.1 IU/mL — ABNORMAL LOW (ref 0.30–0.70)
Heparin Unfractionated: <0.1 IU/mL — ABNORMAL LOW (ref 0.30–0.70)

## 2021-03-31 LAB — CBC
HCT: 31.7 % — ABNORMAL LOW (ref 39.0–52.0)
Hemoglobin: 9.9 g/dL — ABNORMAL LOW (ref 13.0–17.0)
MCH: 30.2 pg (ref 26.0–34.0)
MCHC: 31.2 g/dL (ref 30.0–36.0)
MCV: 96.6 fL (ref 80.0–100.0)
Platelets: 204 10*3/uL (ref 150–400)
RBC: 3.28 MIL/uL — ABNORMAL LOW (ref 4.22–5.81)
RDW: 19.6 % — ABNORMAL HIGH (ref 11.5–15.5)
WBC: 19.6 10*3/uL — ABNORMAL HIGH (ref 4.0–10.5)
nRBC: 0.2 % (ref 0.0–0.2)

## 2021-03-31 LAB — COMPREHENSIVE METABOLIC PANEL
ALT: 30 U/L (ref 0–44)
AST: 88 U/L — ABNORMAL HIGH (ref 15–41)
Albumin: 2.5 g/dL — ABNORMAL LOW (ref 3.5–5.0)
Alkaline Phosphatase: 259 U/L — ABNORMAL HIGH (ref 38–126)
Anion gap: 11 (ref 5–15)
BUN: 29 mg/dL — ABNORMAL HIGH (ref 6–20)
CO2: 22 mmol/L (ref 22–32)
Calcium: 7.4 mg/dL — ABNORMAL LOW (ref 8.9–10.3)
Chloride: 99 mmol/L (ref 98–111)
Creatinine, Ser: 2.63 mg/dL — ABNORMAL HIGH (ref 0.61–1.24)
GFR, Estimated: 28 mL/min — ABNORMAL LOW (ref >60)
Glucose, Bld: 170 mg/dL — ABNORMAL HIGH (ref 70–99)
Potassium: 4.4 mmol/L (ref 3.5–5.1)
Sodium: 132 mmol/L — ABNORMAL LOW (ref 135–145)
Total Bilirubin: 2.4 mg/dL — ABNORMAL HIGH (ref 0.3–1.2)
Total Protein: 5.8 g/dL — ABNORMAL LOW (ref 6.5–8.1)

## 2021-03-31 LAB — GLUCOSE, CAPILLARY
Glucose-Capillary: 125 mg/dL — ABNORMAL HIGH (ref 70–99)
Glucose-Capillary: 132 mg/dL — ABNORMAL HIGH (ref 70–99)
Glucose-Capillary: 144 mg/dL — ABNORMAL HIGH (ref 70–99)
Glucose-Capillary: 145 mg/dL — ABNORMAL HIGH (ref 70–99)
Glucose-Capillary: 186 mg/dL — ABNORMAL HIGH (ref 70–99)
Glucose-Capillary: 227 mg/dL — ABNORMAL HIGH (ref 70–99)
Glucose-Capillary: 232 mg/dL — ABNORMAL HIGH (ref 70–99)

## 2021-03-31 LAB — COOXEMETRY PANEL
Carboxyhemoglobin: 1.9 % — ABNORMAL HIGH (ref 0.5–1.5)
Carboxyhemoglobin: 1.9 % — ABNORMAL HIGH (ref 0.5–1.5)
Methemoglobin: 1.2 % (ref 0.0–1.5)
Methemoglobin: 1.1 % (ref 0.0–1.5)
O2 Saturation: 92.6 %
O2 Saturation: 90.6 %
Total hemoglobin: 10.1 g/dL — ABNORMAL LOW (ref 12.0–16.0)
Total hemoglobin: 10 g/dL — ABNORMAL LOW (ref 12.0–16.0)

## 2021-03-31 LAB — PHOSPHORUS: Phosphorus: 3 mg/dL (ref 2.5–4.6)

## 2021-03-31 LAB — PROTIME-INR
INR: 1.5 — ABNORMAL HIGH (ref 0.8–1.2)
Prothrombin Time: 17.9 seconds — ABNORMAL HIGH (ref 11.4–15.2)

## 2021-03-31 LAB — FIBRINOGEN: Fibrinogen: 392 mg/dL (ref 210–475)

## 2021-03-31 LAB — MAGNESIUM: Magnesium: 2.4 mg/dL (ref 1.7–2.4)

## 2021-03-31 MED ORDER — TRAMADOL HCL 50 MG PO TABS
50.0000 mg | ORAL_TABLET | Freq: Two times a day (BID) | ORAL | Status: DC
Start: 2021-03-31 — End: 2021-04-02
  Administered 2021-03-31 – 2021-04-01 (×4): 50 mg via ORAL
  Filled 2021-03-31 (×4): qty 1

## 2021-03-31 MED ORDER — RENA-VITE PO TABS
1.0000 | ORAL_TABLET | Freq: Every day | ORAL | Status: DC
  Administered 2021-03-31 – 2021-04-01 (×2): 1 via ORAL
  Filled 2021-03-31 (×3): qty 1

## 2021-03-31 MED ORDER — AMIODARONE HCL 200 MG PO TABS
200.0000 mg | ORAL_TABLET | Freq: Two times a day (BID) | ORAL | Status: DC
  Administered 2021-03-31 – 2021-04-01 (×4): 200 mg via ORAL
  Filled 2021-03-31 (×4): qty 1

## 2021-03-31 MED ORDER — INSULIN GLARGINE 100 UNIT/ML ~~LOC~~ SOLN
10.0000 [IU] | Freq: Every day | SUBCUTANEOUS | Status: DC
  Administered 2021-03-31 – 2021-04-07 (×7): 10 [IU] via SUBCUTANEOUS
  Filled 2021-03-31 (×9): qty 0.1

## 2021-03-31 MED ORDER — BOOST / RESOURCE BREEZE PO LIQD CUSTOM
1.0000 | Freq: Three times a day (TID) | ORAL | Status: DC
  Administered 2021-03-31 – 2021-04-01 (×3): 1 via ORAL

## 2021-03-31 NOTE — Progress Notes (Signed)
Occupational Therapy Treatment Patient Details Name: David Rose. MRN: [id number] DOB: Apr 06, 1966 Today's Date: 03/31/2021    History of present illness 55 yo admitted 5/23 for heart failure optimization prior to CABGx 4 performed on 5/31. 5/26 paracentesis. Pt extubated 6/1 but later had PEA arrest with intubation and return to OR for exploration with chest tube placed and extubated 6/2. CRRT started 6/1, ended 6/7. PMhx: CAD, ESRD, pulmonary HTN, CHF, anemia, cirrhosis, DM, finger amputation   OT comments  Patient supine in bed and with encouragement agreeable to OT session.  Pt completing bed mobility with mod assist +2 for trunk support to ascend, seated EOB engaged in ADL tasks- completing donning/doffing socks with supervision and increased time, oral care with setup assist.  Patient reports dizziness throughout session, but VSS.  Pt able to recall sternal precautions during session but requires min cueing to adhere functionally, demonstrating decreased awareness and problem solving, slow processing during session as well. Pt fatigues easily and requests to return back to bed.  Will follow acutely. CIR remains appropriate.   Follow Up Recommendations  CIR    Equipment Recommendations  Other (comment) (TBD)    Recommendations for Other Services Rehab consult    Precautions / Restrictions Precautions Precautions: Fall;Sternal;Other (comment) Precaution Comments: chest tube Restrictions Weight Bearing Restrictions: Yes Other Position/Activity Restrictions: Sternal Precautions       Mobility Bed Mobility Overal bed mobility: Needs Assistance Bed Mobility: Supine to Sit;Sit to Sidelying;Rolling Rolling: Mod assist;+2 for physical assistance;+2 for safety/equipment       Sit to sidelying: Mod assist;+2 for physical assistance;+2 for safety/equipment General bed mobility comments: pt requires mod assist +2 for trunk support to ascend  after managing BLEs off EOB with  increased time. Returned to supine with mod assist +2 for LB support to supine.    Transfers                 General transfer comment: deferred due to fatigue    Balance Overall balance assessment: Needs assistance Sitting-balance support: No upper extremity supported Sitting balance-Leahy Scale: Fair Sitting balance - Comments: min guard to supervision at EOB                                   ADL either performed or assessed with clinical judgement   ADL Overall ADL's : Needs assistance/impaired     Grooming: Oral care;Supervision/safety;Set up;Standing Grooming Details (indicate cue type and reason): at EOB, increased time required             Lower Body Dressing: Minimal assistance;Sitting/lateral leans Lower Body Dressing Details (indicate cue type and reason): pt able to don/doff socks at EOB, would require asisst for clothing over hips (sit to stand not completed this session)               General ADL Comments: pt limited by generalized weakness, decreased activity tolerance and impaired balance     Vision       Perception     Praxis      Cognition Arousal/Alertness: Awake/alert Behavior During Therapy: WFL for tasks assessed/performed Overall Cognitive Status: Impaired/Different from baseline Area of Impairment: Awareness;Problem solving;Attention                   Current Attention Level: Sustained       Awareness: Emergent Problem Solving: Slow processing;Decreased initiation;Difficulty sequencing;Requires verbal cues General Comments: patient with slow  processing and decreased problem solving during ADL tasks, question recall as reports "what are we doing now" after setup with oral care and requires cueing to locate "ice cup" on table.        Exercises Exercises: Other exercises Other Exercises Other Exercises: BUE flexion x 10 reps "in tube" at EOB   Shoulder Instructions       General Comments family at  side, pt reports dizziness during session but VSS.  On 1 L initally fading to RA while sitting EOB with SPO2 maintained >92% but re-donned in supine as SpO2 decrased to 88%. BP 90s/60s. SBP upto 116 at EOB.    Pertinent Vitals/ Pain       Pain Assessment: Faces Faces Pain Scale: Hurts a little bit Pain Location: incisional Pain Descriptors / Indicators: Guarding Pain Intervention(s): Limited activity within patient's tolerance;Monitored during session;Repositioned  Home Living                                          Prior Functioning/Environment              Frequency  Min 2X/week        Progress Toward Goals  OT Goals(current goals can now be found in the care plan section)  Progress towards OT goals: Progressing toward goals  Acute Rehab OT Goals Patient Stated Goal: return home and be independent OT Goal Formulation: With patient/family  Plan Discharge plan remains appropriate;Frequency remains appropriate    Co-evaluation                 AM-PAC OT "6 Clicks" Daily Activity     Outcome Measure   Help from another person eating meals?: A Little Help from another person taking care of personal grooming?: A Little Help from another person toileting, which includes using toliet, bedpan, or urinal?: Total Help from another person bathing (including washing, rinsing, drying)?: A Lot Help from another person to put on and taking off regular upper body clothing?: A Lot Help from another person to put on and taking off regular lower body clothing?: A Lot 6 Click Score: 13    End of Session Equipment Utilized During Treatment: Oxygen (1L)  OT Visit Diagnosis: Unsteadiness on feet (R26.81);Other abnormalities of gait and mobility (R26.89);Muscle weakness (generalized) (M62.81)   Activity Tolerance Patient tolerated treatment well   Patient Left in bed;with call bell/phone within reach;with bed alarm set;with family/visitor present   Nurse  Communication Mobility status;Precautions        Time: [id number] OT Time Calculation (min): 31 min  Charges: OT General Charges $OT Visit: 1 Visit OT Treatments $Self Care/Home Management : 23-37 mins  [place] Pager [phone] Office [phone]    [name] 03/31/2021, 12:57 PM

## 2021-03-31 NOTE — Progress Notes (Signed)
[place] for heparin Indication: atrial fibrillation/flutter  Allergies  Allergen Reactions  . Shellfish-Derived Products Anaphylaxis  . Wasp Venom Protein Anaphylaxis  . Penicillins Other (See Comments)    Childhood reaction    Patient Measurements: Height: '6\' 2"'$  (188 cm) Weight: 78.9 kg (173 lb 15.1 oz) IBW/kg (Calculated) : 82.2 Heparin Dosing Weight: 79kg  Vital Signs: Temp: 98.2 F (36.8 C) (06/07 2350) Temp Source: Oral (06/07 2350) BP: 86/39 (06/08 0115) Pulse Rate: 79 (06/08 0115)  Labs: Recent Labs    03/28/21 1620 03/29/21 0121 03/29/21 0325 03/29/21 1533 03/30/21 0414 03/31/21 0214  HGB 9.2*  --  9.4*  --  9.8*  --   HCT 29.1*  --  30.2*  --  31.1*  --   PLT 70*  71* 91* 91*  92*  --  151  --   APTT 45* 43* 43*  --   --   --   LABPROT 17.1* 16.3* 16.6*  --  17.3*  --   INR 1.4* 1.3* 1.3*  --  1.4*  --   HEPARINUNFRC  --   --   --   --   --  <0.10*  CREATININE 1.48*  --  1.53* 1.61* 1.73*  --     Estimated Creatinine Clearance: 53.8 mL/min (A) (by C-G formula based on SCr of 1.73 mg/dL (H)).  Assessment: 55 year old male with postop afib/flutter s/p CABG 6/1 for heparin.   Goal of Therapy:  Heparin level 0.3-0.5  units/ml (no bolusing) Monitor platelets by anticoagulation protocol: Yes   Plan:  Increase Heparin 1150 units/hr Check heparin level in 8 hours.  Phillis Knack, PharmD, BCPS  03/31/2021 3:06 AM

## 2021-03-31 NOTE — Progress Notes (Signed)
Nutrition Follow-up  DOCUMENTATION CODES:   Severe malnutrition in context of chronic illness  INTERVENTION:   Recommend advancing diet to GI SOFT; if po intake does not improve with diet advancement, recommend Cortrak tube insertion on Friday 6/10  Boost Breeze po TID with meals, each supplement provides 250 kcal and 9 grams of protein  Ensure Enlive po TID between meals, each supplement provides 350 kcal and 20 grams of protein. Encourage Ensure Enlive instead of Boost Breeze if picking between the 2. RN aware  30 ml ProSource Plus TID, each supplement provides 100 kcals and 15 grams protein.   Trial of Magic Cup at [employer]  Add Renal MVI daily; D/C B complex with C   NUTRITION DIAGNOSIS:   Severe Malnutrition related to chronic illness (CAD/CHF/ESRD) as evidenced by severe fat depletion,severe muscle depletion.  Being addressed via diet advancement, supplements  GOAL:   Patient will meet greater than or equal to 90% of their needs  Progressing  MONITOR:   Vent status,Skin,TF tolerance,Weight trends,Labs,I & O's  REASON FOR ASSESSMENT:   Consult Enteral/tube feeding initiation and management  ASSESSMENT:   Patient with PMH significant for DM, HTN, PAD, CHF, CAD, ESRD, cirrhosis, and IBS. Presents this admission for CABG.  5/22- s/p RHC, severe 3vd, high cardiac output 5/26- paracentesis- drained 4L 5/31- s/p CABG 6/01- code, hemorrhagic shock, back to OR for mediastinal exploration, Cortrak placed 6/02- extubated, trickle TF initiated 6/03- TF advanced to goal 6/05- Cortrak pulled per provider  6/07- CRRT discontinued, femoral HD cath removed  Pt sitting on side of bed, brushing his teeth, working with OT on visit today  Off CRRT, plan for iHD tomorrow Remains on levophed  Pre RN, pt drank 5 Boost Breeze yesterday and is taking Pro-Source supplements. Pt prefers Boost Breeze to [employer]. Boost Breeze contains 250 kcals and 9 g of protein  while Ensure Enlive contains 350 kcals and 20g of protein in same volume. 5 Boost Breeze per day only contains 1250 kcals, 45 g of protein. Encourage patient to try to consume some Ensure Enlive as it provides more kcals and protein per volume but will continue both supplements for now in addition to Pro-Source protein modular  Pt currently on CL diet and has been tolerating although eating very little at this time off meal trays. Pt declines nausea at this time, reports vomiting at night but indicates that only happens after he takes pain meidicine. Recommend trial of GI Soft diet. If pt cannot tolerate diet advancement and/or po intake remains inadequate, recommend Cortrak placement   Outpatient EDW 78.5 kg; pt is below EDW at 75.8 kg. Noted moderate edema in BLE with mild generalized edema currently so unsure of actual true dry weight  Pt finally had large BM today after not having once since 5/31  Labs: reviewed Meds: sensipar, aranesp, ss novolog, colace, B complex with C   Diet Order:   Diet Order            DIET SOFT Room service appropriate? Yes; Fluid consistency: Thin  Diet effective now                 EDUCATION NEEDS:   Not appropriate for education at this time  Skin:  Skin Assessment: Skin Integrity Issues: Skin Integrity Issues:: Incisions,DTI DTI: sacrum Incisions: R leg, chest  Last BM:  6/8 large type 7  Height:   Ht Readings from Last 1 Encounters:  02/21/2021 '6\' 2"'$  (1.88 m)  Weight:   Wt Readings from Last 1 Encounters:  03/31/21 75.8 kg    Ideal Body Weight:     BMI:  Body mass index is 21.46 kg/m.  Estimated Nutritional Needs:   Kcal:  2450-2650 kcal  Protein:  125-155 grams  Fluid:  >/= 2 L/day   Kerman Passey MS, RDN, LDN, CNSC Registered Dietitian III Clinical Nutrition RD Pager and On-Call Pager Number Located in [place]

## 2021-03-31 NOTE — Progress Notes (Signed)
EVENING ROUNDS NOTE :     [address]       [place] [address]             [phone]                 7 Days Post-Op Procedure(s) (LRB): EXPLORATION POST OPERATIVE OPEN HEART (N/A)   Total Length of Stay:  LOS: 16 days  Events:   Resting comfortably    BP (!) 108/52   Pulse 94   Temp 97.7 F (36.5 C) (Oral)   Resp 20   Ht '6\' 2"'$  (1.88 m)   Wt 75.8 kg   SpO2 100%   BMI 21.46 kg/m   CVP:  [0 mmHg-14 mmHg] 14 mmHg     . ceFEPime (MAXIPIME) IV Stopped (03/31/21 1747)  . heparin 1,350 Units/hr (03/31/21 1825)  . lactated ringers    . lactated ringers 10 mL/hr at 03/31/21 1800  . norepinephrine (LEVOPHED) Adult infusion 4 mcg/min (03/31/21 1800)    I/O last 3 completed shifts: In: 2704.8 [P.O.:1242; I.V.:1262.8; IV Piggyback:200] Out: [id number] [Urine:100; PB:7898441; Blood:80; Chest Tube:160]   CBC Latest Ref Rng & Units 03/31/2021 03/30/2021 03/29/2021  WBC 4.0 - 10.5 K/uL 19.6(H) 13.7(H) 17.5(H)  Hemoglobin 13.0 - 17.0 g/dL 9.9(L) 9.8(L) 9.4(L)  Hematocrit 39.0 - 52.0 % 31.7(L) 31.1(L) 30.2(L)  Platelets 150 - 400 K/uL 204 151 91(L)    BMP Latest Ref Rng & Units 03/31/2021 03/30/2021 03/29/2021  Glucose 70 - 99 mg/dL 170(H) 117(H) 209(H)  BUN 6 - 20 mg/dL 29(H) 20 18  Creatinine 0.61 - 1.24 mg/dL 2.63(H) 1.73(H) 1.61(H)  Sodium 135 - 145 mmol/L 132(L) 133(L) 133(L)  Potassium 3.5 - 5.1 mmol/L 4.4 4.6 4.8  Chloride 98 - 111 mmol/L 99 102 99  CO2 22 - 32 mmol/L '22 25 23  '$ Calcium 8.9 - 10.3 mg/dL 7.4(L) 7.8(L) 7.7(L)    ABG    Component Value Date/Time   PHART 7.453 (H) 03/28/2021 1402   PCO2ART 27.5 (L) 04/01/2021 1402   PO2ART 97 04/13/2021 1402   HCO3 19.4 (L) 04/16/2021 1402   TCO2 20 (L) 04/16/2021 1402   ACIDBASEDEF 4.0 (H) 03/29/2021 1402   O2SAT 90.6 03/31/2021 [id number]       Melodie Bouillon, MD 03/31/2021 6:58 PM

## 2021-03-31 NOTE — Progress Notes (Signed)
7 Days Post-Op Procedure(s) (LRB): EXPLORATION POST OPERATIVE OPEN HEART (N/A) Subjective: No complaints  Objective: Vital signs in last 24 hours: Temp:  [97.9 F (36.6 C)-98.2 F (36.8 C)] 97.9 F (36.6 C) (06/08 0400) Pulse Rate:  [75-158] 98 (06/08 0745) Cardiac Rhythm: Atrial fibrillation (06/07 1945) Resp:  [12-35] 20 (06/08 0715) BP: (78-158)/(39-90) 95/51 (06/08 0745) SpO2:  [86 %-100 %] 100 % (06/08 0745) Arterial Line BP: (95-126)/(38-48) 110/40 (06/07 1200) Weight:  [75.8 kg] 75.8 kg (06/08 0647)  Hemodynamic parameters for last 24 hours: CVP:  [0 mmHg-14 mmHg] 0 mmHg  Intake/Output from previous day: 06/07 0701 - 06/08 0700 In: 1911.2 [P.O.:960; I.V.:851.2; IV Piggyback:100] Out: 982  Intake/Output this shift: No intake/output data recorded.  General appearance: alert and cooperative Neurologic: intact Heart: regular rate and rhythm, S1, S2 normal, no murmur, click, rub or gallop Lungs: clear to auscultation bilaterally Abdomen: soft, non-tender; bowel sounds normal; no masses,  no organomegaly Extremities: edema mild Wound: c/d/i  Lab Results: Recent Labs    03/30/21 0414 03/31/21 0356  WBC 13.7* 19.6*  HGB 9.8* 9.9*  HCT 31.1* 31.7*  PLT 151 204   BMET:  Recent Labs    03/30/21 0414 03/31/21 0356  NA 133* 132*  K 4.6 4.4  CL 102 99  CO2 25 22  GLUCOSE 117* 170*  BUN 20 29*  CREATININE 1.73* 2.63*  CALCIUM 7.8* 7.4*    PT/INR:  Recent Labs    03/31/21 0356  LABPROT 17.9*  INR 1.5*   ABG    Component Value Date/Time   PHART 7.453 (H) 03/27/2021 1402   HCO3 19.4 (L) 04/11/2021 1402   TCO2 20 (L) 04/09/2021 1402   ACIDBASEDEF 4.0 (H) 04/20/2021 1402   O2SAT 92.6 03/31/2021 0356   CBG (last 3)  Recent Labs    03/30/21 2351 03/31/21 0415 03/31/21 0741  GLUCAP 145* 144* 132*    Assessment/Plan: S/P Procedure(s) (LRB): EXPLORATION POST OPERATIVE OPEN HEART (N/A) Mobilize HD per nephro  PT/OT Appreciate AHF expertise    LOS: 16 days    Wonda Olds 03/31/2021

## 2021-03-31 NOTE — Progress Notes (Signed)
[place] for heparin Indication: atrial flutter  Allergies  Allergen Reactions  . Shellfish-Derived Products Anaphylaxis  . Wasp Venom Protein Anaphylaxis  . Penicillins Other (See Comments)    Childhood reaction    Patient Measurements: Height: '6\' 2"'$  (188 cm) Weight: 75.8 kg (167 lb 1.7 oz) IBW/kg (Calculated) : 82.2 Heparin Dosing Weight: 79kg  Vital Signs: Temp: 97.8 F (36.6 C) (06/08 1125) Temp Source: Oral (06/08 1125) BP: 107/48 (06/08 1245) Pulse Rate: 93 (06/08 1245)  Labs: Recent Labs    03/28/21 1620 03/29/21 0121 03/29/21 0325 03/29/21 1533 03/30/21 0414 03/31/21 0214 03/31/21 0356 03/31/21 0951  HGB 9.2*  --  9.4*  --  9.8*  --  9.9*  --   HCT 29.1*  --  30.2*  --  31.1*  --  31.7*  --   PLT 70*  71* 91* 91*  92*  --  151  --  204  --   APTT 45* 43* 43*  --   --   --   --   --   LABPROT 17.1* 16.3* 16.6*  --  17.3*  --  17.9*  --   INR 1.4* 1.3* 1.3*  --  1.4*  --  1.5*  --   HEPARINUNFRC  --   --   --   --   --  <0.10*  --  <0.10*  CREATININE 1.48*  --  1.53* 1.61* 1.73*  --  2.63*  --     Estimated Creatinine Clearance: 34 mL/min (A) (by C-G formula based on SCr of 2.63 mg/dL (H)).   Medical History: Past Medical History:  Diagnosis Date  . ESRD (end stage renal disease) ([place])   . Hypertension   . Peripheral arterial disease ([place])   . Type 2 diabetes mellitus [facility])    Assessment: 55 year old male with recent CABG now with postop afib/flutter. There was concern several days ago immediately post op for HIT as plt count dropped to 30s. Antibody was ordered and shows sent, actual lab/blood draw never actually sent out/received by lab although shows in process. Discussed with CCM/HF and ok with low dose heparin without bolusing and will disregard HIT antibody in process.  Heparin started yesterday adjusted rate this am and level still undetectable. EP recommending to continue anticoagulation for aflutter.  Possible cardioversion later this admit. Hgb remains stable in the 9s, plt now up to 204.   Goal of Therapy:  Heparin level 0.3-0.5  units/ml  Monitor platelets by anticoagulation protocol: Yes   Plan:  Increase IV heparin infusion to 1350 units/hr Check anti-Xa level in 8 hours and daily while on heparin Continue to monitor H&H and platelets  Erin Hearing PharmD., BCPS Clinical Pharmacist 03/31/2021 1:00 PM

## 2021-03-31 NOTE — Progress Notes (Addendum)
Physical Therapy Treatment Patient Details Name: David Rose. MRN: [id number] DOB: 18-Jan-1966 Today's Date: 03/31/2021    History of Present Illness 55 yo admitted 5/23 for heart failure optimization prior to CABGx 4 performed on 5/31. 5/26 paracentesis. Pt extubated 6/1 but later had PEA arrest with intubation and return to OR for exploration with chest tube placed and extubated 6/2. CRRT started 6/1, ended 6/7. PMhx: CAD, ESRD, pulmonary HTN, CHF, anemia, cirrhosis, DM, finger amputation    PT Comments    Pt was lethargic and symptomatic with soft BP's in the 50's/40's in sitting and 70's/ upper 40's-50's.  Emphasis on warm up exercise with plan for gait, but unable to get past a few minutes at EOB x2 due to feelings of light-headedness and near passing out.  Extra time spent trying to get pt positioned in bed comfortably.     Follow Up Recommendations  CIR     Equipment Recommendations  Rolling walker with 5" wheels    Recommendations for Other Services       Precautions / Restrictions Precautions Precautions: Fall;Sternal;Other (comment) Precaution Comments: chest tube Restrictions Weight Bearing Restrictions: Yes Other Position/Activity Restrictions: Sternal Precautions    Mobility  Bed Mobility Overal bed mobility: Needs Assistance Bed Mobility: Supine to Sit;Sit to Supine     Supine to sit: Max assist;+2 for safety/equipment Sit to supine: Max assist;+2 for physical assistance   General bed mobility comments: pt needed cues for direction/technique and assist at trunk up toward EOB with pt unable to build any momentum to come up.  Assisted trunk and LE's from sit to supine on 2 occasions due to pt starting to pass out.    Transfers                 General transfer comment: deferred due to very soft BP's x 2 trials of sitting EOB hoping to stand and ambulate  Ambulation/Gait             General Gait Details: unable today due to low BP's and pt  feeling faint.   Stairs             Wheelchair Mobility    Modified Rankin (Stroke Patients Only)       Balance Overall balance assessment: Needs assistance   Sitting balance-Leahy Scale: Fair                                      Cognition Arousal/Alertness: Awake/alert Behavior During Therapy: WFL for tasks assessed/performed Overall Cognitive Status: Impaired/Different from baseline Area of Impairment: Awareness;Problem solving;Attention                   Current Attention Level: Sustained       Awareness: Emergent Problem Solving: Slow processing;Decreased initiation;Difficulty sequencing;Requires verbal cues        Exercises Other Exercises Other Exercises: bil hip/knee flexion/ext with graded resistance gross flex/ext x10 Other Exercises: bil bicep/tricep pressess with graded resistance x 10 reps.    General Comments General comments (skin integrity, edema, etc.): Initial sitting BP 79/46 (58), after 2 min  58/48 (54).  lying in between sitting 80's/52.  2nd trial of sitting, sats initially 70/54 (61) and dropped to 55/46 with pt's stating going to pass out with jerking.  Further EOB or attempts to mobilize OOB aborter.  RN notified.      Pertinent Vitals/Pain Pain Assessment: Faces Faces Pain  Scale: Hurts a little bit Pain Location: incisional Pain Descriptors / Indicators: Guarding Pain Intervention(s): Monitored during session    Home Living                      Prior Function            PT Goals (current goals can now be found in the care plan section) Acute Rehab PT Goals Patient Stated Goal: return home and be independent PT Goal Formulation: With patient/family Time For Goal Achievement: 04/10/21 Potential to Achieve Goals: Fair Progress towards PT goals: Not progressing toward goals - comment (soft BP's limited mobility)    Frequency    Min 3X/week      PT Plan Current plan remains appropriate     Co-evaluation              AM-PAC PT "6 Clicks" Mobility   Outcome Measure  Help needed turning from your back to your side while in a flat bed without using bedrails?: A Lot Help needed moving from lying on your back to sitting on the side of a flat bed without using bedrails?: A Lot Help needed moving to and from a bed to a chair (including a wheelchair)?: A Lot Help needed standing up from a chair using your arms (e.g., wheelchair or bedside chair)?: A Lot Help needed to walk in hospital room?: Total Help needed climbing 3-5 steps with a railing? : Total 6 Click Score: 10    End of Session Equipment Utilized During Treatment: Oxygen Activity Tolerance: Patient limited by fatigue;Other (comment) (soft and symptomatic  BP's) Patient left: in bed;with call bell/phone within reach;with nursing/sitter in room;with family/visitor present Nurse Communication: Mobility status;Precautions PT Visit Diagnosis: Other abnormalities of gait and mobility (R26.89);Muscle weakness (generalized) (M62.81);Difficulty in walking, not elsewhere classified (R26.2)     Time: [id number] PT Time Calculation (min) (ACUTE ONLY): 39 min  Charges:  $Therapeutic Exercise: 8-22 mins $Therapeutic Activity: 23-37 mins                     03/31/2021  Ginger Carne., PT Acute Rehabilitation Services [phone]  (pager) [phone]  (office)   [name] 03/31/2021, 4:33 PM

## 2021-03-31 NOTE — Progress Notes (Signed)
NAME:  David Rose., MRN:  [id number], DOB:  06-27-1966, LOS: 46 ADMISSION DATE:  03/22/2021, CONSULTATION DATE:  04/01/2021 REFERRING MD:  Verl Blalock, CHIEF COMPLAINT:  code   History of Present Illness:  55 year old man w/ hx of DM2, HTN, PAD, ESRD, cirrhosis (? Cardiac) who presented for symptomatic ischemic cardiomyopathy.  Underwent pre-op optimization with CHF, GI, and nephrology teams then high risk CABG 5/31. On  5/31-6/1 [name] unfortunately developed respiratory distress followed by PEA and chest compressions. This caused increased bleeding around surgical sites, underwent mediastinal exploration this AM with some bleeding around mammary graft site and diffuse oozing.  Received multiple units of blood products overnight.  He is intubated, on pressors.  PCCM asked to assist with management.  Pertinent  Medical History  ESRD on HD Cardiac cirrhosis with portal hypertension IBS IDDM  Significant Hospital Events: Including procedures, antibiotic start and stop dates in addition to other pertinent events   . 5/23 admitted . 5/26 paracentesis 4L . 5/30 HD . 5/31 CABG . 5/31-6/1 code, OR for mediastinal exploration 2/2 hemorrhagic shock induced by CPR. Factor 7, protamine, FFP, cryo given . 6/1 PCCM consult, 4u prbc and 1 plt . R pigtail chest tube placed due to limitation of vent weaning. . 6/2 extubated, chest tube 3.2L out past 24 hrs . 6/3 remains on 2L [place], chest tube with 500cc out/ 24 hrs . 6/4 remains on CRRT, 2L [place], [place], [place]. 3 chest tubes remain. . 6/6: remains on CRRT, 4 chest tubes in place  Interim History / Subjective:   Patient up in chair eating breakfast On 1 liters [place] Patient only complaint of mild chest pain with coughing Denies SOB On Levo 3 (7 yesterday), Amio 30, Midodrine 15: Coox 92% R pigtail chest tube in place on suction.   Objective   Blood pressure (!) 90/49, pulse 81, temperature 97.9 F (36.6 C), temperature source Oral, resp. rate 13,  height _0  (1.88 m), weight 78.9 kg, SpO2 99 %. 4LNC CVP:  [10 mmHg-14 mmHg] 14 mmHg      Intake/Output Summary (Last 24 hours) at 03/31/2021 0659 Last data filed at 03/31/2021 0600 Gross per 24 hour  Intake 1903.59 ml  Output 1126 ml  Net 777.59 ml   Filed Weights   03/27/21 0715 03/28/21 0500 03/30/21 0500  Weight: 80.5 kg 80.5 kg 78.9 kg    Examination: General: ill appearing male. NAD HEENT: MM pink/moist; [place] in place Neuro: AOx3 CV: s1s2, no m/r/g; mild chest pain upon palpation PULM: dim bs bilaterally; On 1 l/m [place]; R pigtail CT in place GI: soft, bsx4 active  Extremities: warm/dry, BLE pedal edema, RUE AVF Skin: no rashes or lesions  Labs/imaging that I havepersonally reviewed  (right click and "Reselect all SmartList Selections" daily)   Creatinine 2.63, BUN 29 (rising) Alk phosph 259, AST 88, total bili 2.4 WBC 19.6 from 13.7 Hgb 9.9 Platelets 204 (rising) Coox 92%  CXR: bilateral infiltrates edema; cardiomegaly; right chest tube in place   Resolved Hospital Problem list   Acute Encephalopathy  Assessment & Plan:   Acute hypoxemic respiratory failure- improved Bilateral pleural effusions;  inferior R sided pigtail tube in place; surgical CT removed 6/7 Likely LLL HAP P: -CTS following: Surgical CT removed 6/7; will keep R pigtail in place to suction and monitor CT output. - repeat CXR am -Wean O2 for sats >92% -Continue Cefepime for HAP -Aggressive Pulm toiletry: IS and Flutter -PT/OT and OOB as tolerated  Post-op mediastinal hemorrhage Cirrhotic and hemorrhage- induced consumptive coagulopathy Hemorrhagic shock, resolved Acute blood loss anemia (improving) Thrombocytopenia- (Improving): in setting of blood loss, coagulopathy, and cirrhosis P: -Trend CBC -transfuse for hgb <7  In hospital cardiac arrest precipitated by respiratory distress- PEA, minimal downtime. 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 stenosis and concern of R IJ stenosis; difficult access, RUE AVF P:  -Per nephrology: plan to do iHD on 6/9 with RUE AVF -Trend CMP  DM2, controlled P: -SSI and CBG monitoring  Best practice (right click and "Reselect all SmartList Selections" daily)  Diet:  Oral Pain/Anxiety/Delirium protocol (if indicated): No VAP protocol (if indicated): Not indicated DVT prophylaxis: Systemic AC  GI prophylaxis: PPI Glucose control:  SSI Yes Central venous access:  Yes, and it is still needed Arterial line:  N/A  Foley:  N/A Mobility:  bed rest  PT consulted: Yes Last date of multidisciplinary goals of care discussion [patient updated on plan of care on 6/8; no family at bedside] Code Status:  full code Disposition: per primary   This patient is critically ill with multiple organ system failure which requires frequent high complexity decision making, assessment, support, evaluation, and titration of therapies. This was completed through the application of advanced monitoring technologies and extensive interpretation of multiple databases. During this encounter critical care time was devoted to patient care services described in this note for 35 minutes.  JD Rexene Agent [facility] 03/31/2021, 6:59 AM  Please see [email or web address] for pager details.  From 7A-7P if no response, please call [phone]. After hours, please call ELink [phone].

## 2021-03-31 NOTE — Progress Notes (Signed)
Pharmacy Antibiotic Note  David Rose. is a 55 y.o. male admitted on 03/01/2021 now with concern for PNA.  Pharmacy has been consulted for Vancomycin and Cefepime dosing. CXR with airspace opacity consistent with PNA on left with small L pleural effusion. WBC elevated 19, afebrile.  Patient now off crrt with iHD plans for 6/9. Cefepime dose adjusted yesterday. No cultures done.   Plan: Cefepime1g q24 hours Monitor cultures/sensitivities, clinical progression   Height: '6\' 2"'$  (188 cm) Weight: (P) 75.8 kg (167 lb 1.7 oz) IBW/kg (Calculated) : 82.2  Temp (24hrs), Avg:98 F (36.7 C), Min:97.9 F (36.6 C), Max:98.2 F (36.8 C)  Recent Labs  Lab 03/28/21 0405 03/28/21 1620 03/29/21 0325 03/29/21 1533 03/30/21 0414 03/31/21 0356  WBC 22.0* 18.9* 17.5*  --  13.7* 19.6*  CREATININE 1.60* 1.48* 1.53* 1.61* 1.73* 2.63*    Estimated Creatinine Clearance: 35.4 mL/min (A) (by C-G formula based on SCr of 2.63 mg/dL (H)).    Allergies  Allergen Reactions  . Shellfish-Derived Products Anaphylaxis  . Wasp Venom Protein Anaphylaxis  . Penicillins Other (See Comments)    Childhood reaction    Antimicrobials this admission: Vancomycin 5/31x1, 6/5 >> 6/5 Cefepime 6/5 >>  Ancef 5/31x2 Levofloxacin 6/1 x1  Dose adjustments this admission: N/A  Microbiology results: 5/23 MRSA PCR: neg 5/31 MRSA PCR: neg 6/5 MRSA PCR: neg  Erin Hearing PharmD., BCPS Clinical Pharmacist 03/31/2021 7:21 AM  Please check [email or web address] for unit-specific pharmacy phone numbers.

## 2021-03-31 NOTE — Progress Notes (Addendum)
Patient ID: David Rose., male   DOB: 24-Nov-1965, 55 y.o.   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  Based on low EF and elevated PA pressure, right and left heart cath was done in 5/22.  This showed severe 3VD, high cardiac output with moderate pulmonary hypertension but low PVR.  He is planned for CABG, but admitted pre-op for optimization.    Subjective:    CABG x 4 on 5/31 with LIMA-LAD, SVG-RCA, SVG-OM, SVG-D.   Coagulopathic post-op with multiple blood products.  Extubated post-op but developed respiratory distress => PEA arrest then VT. Reintubated.  ROSC with ACLS.  Returned to OR for mediastinal exploration 6/1.  Extubated 6/2.  6/6 Midodrine started.   CVVH stopped yesterday. Femoral HD cath pulled. CVP 9-10   Currently on NE 3 +  amiodarone 30.  Co-ox resulted at 93%. Will repeat.   On Cefepime for HCAP  HIT sent 6/2 (pending) -->Plts 47 => 51 => 68=>91=>191=>204.    OOB sitting up in chair. Feels ok.     Objective:   Weight Range: (P) 75.8 kg Body mass index is 21.46 kg/m (pended).   Vital Signs:   Temp:  [97.9 F (36.6 C)-98.2 F (36.8 C)] 97.9 F (36.6 C) (06/08 0400) Pulse Rate:  [75-158] 81 (06/08 0600) Resp:  [12-35] 13 (06/08 0600) BP: (78-158)/(39-90) 90/49 (06/08 0600) SpO2:  [91 %-100 %] 99 % (06/08 0600) Arterial Line BP: (95-126)/(38-48) 110/40 (06/07 1200) Weight:  [75.8 kg] (P) 75.8 kg (06/08 0647) Last BM Date: 03/31/21  Weight change: Filed Weights   03/28/21 0500 03/30/21 0500 03/31/21 0647  Weight: 80.5 kg 78.9 kg (P) 75.8 kg    Intake/Output:   Intake/Output Summary (Last 24 hours) at 03/31/2021 0709 Last data filed at 03/31/2021 0600 Gross per 24 hour   Intake 1870.33 ml  Output 982 ml  Net 888.33 ml      Physical Exam   CVP 9-10 (sitting up in chair)  General: thin male, No respiratory difficulty HEENT: normal Neck: supple. + Rt IJ CVC  Carotids 2+ bilat; no bruits. No lymphadenopathy or thyromegaly appreciated. Cor: PMI nondisplaced. Regular rate & rhythm. No rubs, gallops or murmurs. Sternotomy site stable. + CTs  Lungs: decreased BS at the bases, L >R  Abdomen: soft, nontender, nondistended. No hepatosplenomegaly. No bruits or masses. Good bowel sounds. Extremities: no cyanosis, clubbing, rash, edema + Ted hoses + RUE fistula  Neuro: alert & oriented x 3, cranial nerves grossly intact. moves all 4 extremities w/o difficulty. Affect pleasant.    Telemetry   NSR 90s   EKG    N/A  Labs    CBC Recent Labs    03/30/21 0414 03/31/21 0356  WBC 13.7* 19.6*  HGB 9.8* 9.9*  HCT 31.1* 31.7*  MCV 96.0 96.6  PLT 151 0000000   Basic Metabolic Panel Recent Labs    03/30/21 0414 03/31/21 0356  NA 133* 132*  K 4.6 4.4  CL 102 99  CO2 25 22  GLUCOSE 117* 170*  BUN 20 29*  CREATININE 1.73* 2.63*  CALCIUM 7.8* 7.4*  MG 2.4 2.4  PHOS 2.6 3.0   Liver Function Tests Recent  Labs    03/30/21 0414 03/31/21 0356  AST 40 88*  ALT 15 30  ALKPHOS 177* 259*  BILITOT 2.8* 2.4*  PROT 5.9* 5.8*  ALBUMIN 2.6* 2.5*   No results for input(s): LIPASE, AMYLASE in the last 72 hours. Cardiac Enzymes No results for input(s): CKTOTAL, CKMB, CKMBINDEX, TROPONINI in the last 72 hours.  BNP: BNP (last 3 results) Recent Labs    03/02/2021 1545  BNP 1,127.8*    ProBNP (last 3 results) No results for input(s): PROBNP in the last 8760 hours.   D-Dimer Recent Labs    03/29/21 0121 03/29/21 0325  DDIMER 14.06* 13.93*   Hemoglobin A1C No results for input(s): HGBA1C in the last 72 hours. Fasting Lipid Panel No results for input(s): CHOL, HDL, LDLCALC, TRIG, CHOLHDL, LDLDIRECT in the last 72 hours. Thyroid Function  Tests No results for input(s): TSH, T4TOTAL, T3FREE, THYROIDAB in the last 72 hours.  Invalid input(s): FREET3  Other results:   Imaging    DG Chest Port 1 View  Result Date: 03/31/2021 CLINICAL DATA:  Chest tube.  Open-heart surgery. EXAM: PORTABLE CHEST 1 VIEW COMPARISON:  03/30/2021. FINDINGS: Interval removal of mediastinal drainage catheter and left chest tube. Right chest tube in stable position. Right IJ sheath in stable position. Prior CABG. Stable cardiomegaly. Progressive bilateral pulmonary infiltrates/edema. Small bilateral pleural effusions. No pneumothorax. IMPRESSION: 1. Interim removal of venous dental drainage catheter left chest tube. Right chest tube in stable position. No pneumothorax. 2. Prior CABG. Cardiomegaly. Progressive bilateral pulmonary infiltrates/edema. Small bilateral pleural effusions. Findings suggest CHF. Electronically Signed   By: Marcello Moores  Register   On: 03/31/2021 07:01     Medications:     Scheduled Medications: . (feeding supplement) PROSource Plus  30 mL Oral TID BM  . sodium chloride   Intravenous Once  . sodium chloride   Intravenous Once  . sodium chloride   Intravenous Once  . sodium chloride   Intravenous Once  . aspirin EC  81 mg Oral Daily  . atorvastatin  40 mg Oral Daily  . B-complex with vitamin C  1 tablet Oral Daily  . bisacodyl  10 mg Oral Daily   Or  . bisacodyl  10 mg Rectal Daily  . Chlorhexidine Gluconate Cloth  6 each Topical Daily  . cinacalcet  30 mg Oral Q T,Th,Sa-HD  . [START ON 04/01/2021] darbepoetin (ARANESP) injection - NON-DIALYSIS  60 mcg Subcutaneous Q Thu-1800  . docusate sodium  200 mg Oral Daily  . feeding supplement  1 Container Oral TID BM  . feeding supplement  237 mL Oral TID BM  . insulin aspart  0-24 Units Subcutaneous Q4H  . mouth rinse  15 mL Mouth Rinse BID  . midodrine  15 mg Oral TID WC  . pantoprazole  40 mg Oral Daily  . polyethylene glycol  17 g Oral Daily  . sildenafil  20 mg Oral TID  .  sodium chloride flush  10 mL Intracatheter Q8H  . sodium chloride flush  10-40 mL Intracatheter Q12H  . sodium chloride flush  3 mL Intravenous Q12H    Infusions: . sodium chloride    . sodium chloride Stopped (03/26/21 1410)  . amiodarone 30 mg/hr (03/31/21 0600)  . ceFEPime (MAXIPIME) IV    . heparin 1,150 Units/hr (03/31/21 0600)  . lactated ringers    . lactated ringers Stopped (03/29/21 2242)  . lactated ringers 10 mL/hr at 03/31/21 0600  . norepinephrine (LEVOPHED) Adult infusion 3 mcg/min (03/31/21 0600)  PRN Medications: lactated ringers, lidocaine (PF), lip balm, metoprolol tartrate, morphine injection, ondansetron (ZOFRAN) IV, oxyCODONE, sodium chloride flush, sodium chloride flush, traMADol     Assessment/Plan   1. CAD: Severe 3VD with decreased EF.  I reviewed films with Dr. Ellyn Hack, PCI would be possible but would be difficult/high risk with multiple lesions and heavy calcification. 5/31 LIMA-LAD, SVG-RCA, SVG-OM, SVG-D. - Continue ASA 81.  - Continue atorvastatin 40 mg daily.  - CT output slowing.  2. Acute/chronic HF with mid range EF => cardiogenic shock post-CABG: Suspect ischemic cardiomyopathy.  Echo 4/22 with EF 40-45% with moderately decreased RV systolic function and PASP 99, moderate TR. There was a prominent component of RV failure with RA pressure elevated out of proportion to PCWP (CVP/PCWP 0.875 on RHC) but PAPI adequate at 2.8. Post-op had PEA arrest then VT. Currently on NE 3.  CO-OX pending  - CVP 9-10.  Continue midodrine 15 mg tid.  - Continue to wean NE.   -Off CVVH, Transition to iHD today  3. ESRD: Suspected due to diabetes.  Volume up post-op with multiple blood products. - now off CVVH. Transition to iHD 4. 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  The CO was not markedly high.  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 Oxygen  saturation was 99% on RA pre-op, so no evidence for hepatopulmonary syndrome.   Moderate PH currently.  - Continue sildenafil 20 mg tid  5. Cirrhosis: Noted on abdominal US from 2020.  H/o paracentesis.  Had workup at [facility] (though patient does not remember this), viral hepatitis labs negative.  They ended up think that the cirrhosis was cardiogenic.  Repeat abdominal US 5/23 w/ nodular hepatic parenchymal pattern with increased echogenicity consistent with the patient's known cirrhosis. No focal hepatic abnormality identified. Portal vein is patent. Moderate ascites. GI has seen, patient had 4L paracentesis on 5/26. 6. Coagulopathy: Post-op bleeding in setting of coagulopathy post-CABG in patient with cirrhosis.  Currently seems controlled.  Platelets dropped, HIT sent => plts recovered to 204, think drop may have been due to sepsis.  7. VT: Had after PEA arrest am 6/1, continue amiodarone gtt 30.  8. Anemia: ABLA.  Stable today. Hgb stable. .   9. Pleural effusions: Right effusion with chest tube, ?hepatic hydrothorax.  CT drainage slowing.     10. PEA arrest: Respiratory arrest post-extubation.  ROSC with CPR.  11. Acute hypoxemic respiratory failure:  Extubated 6/2 . On room air with stable sats.  12. Atrial fibrillation: Atypical atrial flutter post-op.   - Continue amio drip.  - heparin gtt started 6/7, discussed with CCM and do not think HIT.  13. ID: Cefepime for ?sepsis syndrome/HCAP.   Lyda Jester PA-C  03/31/2021 7:09 AM  Patient seen with PA, agree with the above note.   He remains in atypical flutter versus atrial tachycardia with 2:1 block.  He is now on NE 3 and midodrine 15 tid. CVP 9-10 cm.  Off CVVH.   General: NAD Neck: JVP 10 cm, no thyromegaly or thyroid nodule.  Lungs: Clear to auscultation bilaterally with normal respiratory effort. CV: Nondisplaced PMI.  Heart regular S1/S2, no S3/S4, no murmur.  1+ ankle edema.   Abdomen: Soft, nontender, no hepatosplenomegaly,  no distention.  Skin: Intact without lesions or rashes.  Neurologic: Alert and oriented x 3.  Psych: Normal affect. Extremities: No clubbing or cyanosis.  HEENT: Normal.   Wean NE  as able, continue midodrine.  Will need iHD for volume management, per renal.   Continue sildenafil 20 tid for pulmonary hypertension.   In atypical flutter versus AT => will review with EP regarding anticoagulation (if AT, may not need).  Will eventually need DCCV to NSR.  Continue heparin gtt for now, unlikely HIT (platelets up to 204, looks like HIT test was lost).  Stop amiodarone gtt today, start amiodarone 200 mg po bid.   Mobilize with PT/OT.   CRITICAL CARE Performed by: Loralie Champagne  Total critical care time: 35 minutes  Critical care time was exclusive of separately billable procedures and treating other patients.  Critical care was necessary to treat or prevent imminent or life-threatening deterioration.  Critical care was time spent personally by me on the following activities: development of treatment plan with patient and/or surrogate as well as nursing, discussions with consultants, evaluation of patient's response to treatment, examination of patient, obtaining history from patient or surrogate, ordering and performing treatments and interventions, ordering and review of laboratory studies, ordering and review of radiographic studies, pulse oximetry and re-evaluation of patient's condition.  Loralie Champagne 03/31/2021 7:42 AM

## 2021-03-31 NOTE — Progress Notes (Signed)
Reviewed EKG w/ EP. Atrial Flutter. Continue heparin gtt.

## 2021-03-31 NOTE — Progress Notes (Signed)
Cortrak Tube Team Note:  Consult received to place a Cortrak feeding tube. Discussed pt with RD who reports pt has had diet advanced to soft and has done well with oral nutrition supplements so far, so plan is to monitor po intake x48 hours and reassess need for Cortrak on next day of service, Friday, 04/02/21. Will not place Cortrak today, but will leave order on list at this time.   Larkin Ina, MS, RD, LDN RD pager number and weekend/on-call pager number located in [place].

## 2021-03-31 NOTE — Progress Notes (Signed)
ANTICOAGULATION CONSULT NOTE - Follow Up Consult  Pharmacy Consult for Heparin Indication: atrial fibrillation or flutter  Allergies  Allergen Reactions  . Shellfish-Derived Products Anaphylaxis  . Wasp Venom Protein Anaphylaxis  . Penicillins Other (See Comments)    Childhood reaction    Patient Measurements: Height: '6\' 2"'$  (188 cm) Weight: 75.8 kg (167 lb 1.7 oz) IBW/kg (Calculated) : 82.2 Heparin Dosing Weight: 75.8 kg  Vital Signs: Temp: 98.6 F (37 C) (06/08 1949) Temp Source: Oral (06/08 1949) BP: 96/46 (06/08 2100) Pulse Rate: 85 (06/08 2100)  Labs: Recent Labs    03/29/21 0121 03/29/21 0121 03/29/21 0325 03/29/21 1533 03/30/21 0414 03/31/21 0214 03/31/21 0356 03/31/21 0951 03/31/21 2155  HGB  --    < > 9.4*  --  9.8*  --  9.9*  --   --   HCT  --   --  30.2*  --  31.1*  --  31.7*  --   --   PLT 91*  --  91*  92*  --  151  --  204  --   --   APTT 43*  --  43*  --   --   --   --   --   --   LABPROT 16.3*  --  16.6*  --  17.3*  --  17.9*  --   --   INR 1.3*  --  1.3*  --  1.4*  --  1.5*  --   --   HEPARINUNFRC  --   --   --   --   --  <0.10*  --  <0.10* <0.10*  CREATININE  --    < > 1.53* 1.61* 1.73*  --  2.63*  --   --    < > = values in this interval not displayed.    Estimated Creatinine Clearance: 34 mL/min (A) (by C-G formula based on SCr of 2.63 mg/dL (H)).   Assessment:  Anticoag: enox 30 pre-op >> was off any AC post-op given significant bleeding. Heparin resumed. HL repeatedly <0.1. Infusion running ok as check by RN after switch to central line.  Goal of Therapy:  Heparin level 0.3-0.7 units/ml Monitor platelets by anticoagulation protocol: Yes   Plan:  Increase IV heparin to 1650 units/hr.  Recheck in AM. Daily HL and CBC   Catherine Cubero S. Alford Highland, PharmD, BCPS Clinical Staff Pharmacist [email or web address] Alford Highland, Jaicob Dia Stillinger 03/31/2021,10:30 PM

## 2021-03-31 NOTE — Progress Notes (Signed)
Patient ID: David Menken., male   DOB: 1966/06/24, 55 y.o.   MRN: [id number] S: Feels weak but was up walking with PT O:BP (!) 60/35   Pulse 78   Temp 97.9 F (36.6 C) (Oral)   Resp 20   Ht '6\' 2"'$  (1.88 m)   Wt 75.8 kg   SpO2 100%   BMI 21.46 kg/m   Intake/Output Summary (Last 24 hours) at 03/31/2021 1031 Last data filed at 03/31/2021 1000 Gross per 24 hour  Intake 1488.82 ml  Output 413 ml  Net 1075.82 ml   Intake/Output: I/O last 3 completed shifts: In: 2704.8 [P.O.:1242; I.V.:1262.8; IV Piggyback:200] Out: [id number] [Urine:100; KH:[id number]; Blood:80; Chest Tube:160]  Intake/Output this shift:  Total I/O In: 370.2 [P.O.:240; I.V.:120.2] Out: 20 [Chest Tube:20] Weight change: -3.1 kg Gen: frail, chronically ill-appearing and older than stated age CVS: RRR Resp: CTA Abd: +BS, soft, NT/ND Ext: no edema, RUE AVF +T/B  Recent Labs  Lab 03/25/21 0442 03/25/21 1523 03/26/21 0442 03/26/21 1632 03/27/21 0509 03/27/21 1614 03/28/21 0405 03/28/21 1620 03/29/21 0325 03/29/21 1533 03/30/21 0414 03/31/21 0356  NA 138   < > 134*   < > 135 135 134* 132* 134* 133* 133* 132*  K 3.9   < > 4.7   < > 4.6 4.6 4.7 5.2* 5.0 4.8 4.6 4.4  CL 103   < > 101   < > 100 102 103 99 101 99 102 99  CO2 24   < > 26   < > '25 26 26 26 25 23 25 22  '$ GLUCOSE 141*   < > 162*   < > 153* 101* 165* 174* 132* 209* 117* 170*  BUN 16   < > 11   < > '14 15 17 18 17 18 20 '$ 29*  CREATININE 2.16*   < > 1.74*   < > 1.74* 1.69* 1.60* 1.48* 1.53* 1.61* 1.73* 2.63*  ALBUMIN 3.1*   < > 3.2*   < > 3.0* 2.7* 2.6* 3.0* 2.9* 2.8* 2.6* 2.5*  CALCIUM 8.6*   < > 8.0*   < > 8.0* 7.7* 7.7* 7.4* 7.7* 7.7* 7.8* 7.4*  PHOS 3.0   < > 3.5   < >  --  1.9* 1.9* 2.8 2.2* 5.4* 2.6 3.0  AST 34  --  22  --  26  --  31  --  38  --  40 88*  ALT 14  --  8  --  6  --  10  --  13  --  15 30   < > = values in this interval not displayed.   Liver Function Tests: Recent Labs  Lab 03/29/21 0325 03/29/21 1533 03/30/21 0414 03/31/21 0356   AST 38  --  40 88*  ALT 13  --  15 30  ALKPHOS 162*  --  177* 259*  BILITOT 4.7*  --  2.8* 2.4*  PROT 5.9*  --  5.9* 5.8*  ALBUMIN 2.9* 2.8* 2.6* 2.5*   No results for input(s): LIPASE, AMYLASE in the last 168 hours. No results for input(s): AMMONIA in the last 168 hours. CBC: Recent Labs  Lab 03/28/21 0405 03/28/21 0537 03/28/21 1620 03/29/21 0121 03/29/21 0325 03/30/21 0414 03/31/21 0356  WBC 22.0*  --  18.9*  --  17.5* 13.7* 19.6*  HGB 9.6*  --  9.2*  --  9.4* 9.8* 9.9*  HCT 30.4*  --  29.1*  --  30.2* 31.1* 31.7*  MCV 96.2  --  96.7  --  97.4 96.0 96.6  PLT 66*   < > 70*  71*   < > 91*  92* 151 204   < > = values in this interval not displayed.   Cardiac Enzymes: No results for input(s): CKTOTAL, CKMB, CKMBINDEX, TROPONINI in the last 168 hours. CBG: Recent Labs  Lab 03/30/21 1639 03/30/21 1930 03/30/21 2351 03/31/21 0415 03/31/21 0741  GLUCAP 211* 113* 145* 144* 132*    Iron Studies: No results for input(s): IRON, TIBC, TRANSFERRIN, FERRITIN in the last 72 hours. Studies/Results: DG Chest Port 1 View  Result Date: 03/31/2021 CLINICAL DATA:  Chest tube.  Open-heart surgery. EXAM: PORTABLE CHEST 1 VIEW COMPARISON:  03/30/2021. FINDINGS: Interval removal of mediastinal drainage catheter and left chest tube. Right chest tube in stable position. Right IJ sheath in stable position. Prior CABG. Stable cardiomegaly. Progressive bilateral pulmonary infiltrates/edema. Small bilateral pleural effusions. No pneumothorax. IMPRESSION: 1. Interim removal of venous dental drainage catheter left chest tube. Right chest tube in stable position. No pneumothorax. 2. Prior CABG. Cardiomegaly. Progressive bilateral pulmonary infiltrates/edema. Small bilateral pleural effusions. Findings suggest CHF. Electronically Signed   By: Marcello Moores  Register   On: 03/31/2021 07:01   DG Chest Port 1 View  Result Date: 03/30/2021 CLINICAL DATA:  Chest tube. EXAM: PORTABLE CHEST 1 VIEW COMPARISON:   03/28/2021. FINDINGS: Right IJ sheath, mediastinal drainage catheter, bilateral chest tubes in stable position. Prior CABG. Cardiomegaly. Left lung infiltrate again noted. Small left pleural effusion again noted. Previously identified tiny right pneumothorax no longer identified. Tiny left apical pneumothorax cannot be completely excluded on today's exam. IMPRESSION: 1. Lines and tubes including bilateral chest tubes in stable position. Previously identified tiny right pneumothorax no longer identified. Tiny left apical pneumothorax cannot be completely excluded on today's exam. 2.  Prior CABG.  Stable cardiomegaly. 3. Left lung infiltrate again noted. Small left pleural effusion again noted. Electronically Signed   By: Marcello Moores  Register   On: 03/30/2021 06:54   . (feeding supplement) PROSource Plus  30 mL Oral TID BM  . sodium chloride   Intravenous Once  . sodium chloride   Intravenous Once  . sodium chloride   Intravenous Once  . sodium chloride   Intravenous Once  . amiodarone  200 mg Oral BID  . aspirin EC  81 mg Oral Daily  . atorvastatin  40 mg Oral Daily  . B-complex with vitamin C  1 tablet Oral Daily  . bisacodyl  10 mg Oral Daily   Or  . bisacodyl  10 mg Rectal Daily  . Chlorhexidine Gluconate Cloth  6 each Topical Daily  . cinacalcet  30 mg Oral Q T,Th,Sa-HD  . [START ON 04/01/2021] darbepoetin (ARANESP) injection - NON-DIALYSIS  60 mcg Subcutaneous Q Thu-1800  . docusate sodium  200 mg Oral Daily  . feeding supplement  1 Container Oral TID BM  . feeding supplement  237 mL Oral TID BM  . insulin aspart  0-24 Units Subcutaneous Q4H  . mouth rinse  15 mL Mouth Rinse BID  . midodrine  15 mg Oral TID WC  . pantoprazole  40 mg Oral Daily  . polyethylene glycol  17 g Oral Daily  . sildenafil  20 mg Oral TID  . sodium chloride flush  10 mL Intracatheter Q8H  . sodium chloride flush  10-40 mL Intracatheter Q12H  . sodium chloride flush  3 mL Intravenous Q12H  . traMADol  50-100 mg Oral  Q12H    BMET  Component Value Date/Time   NA 132 (L) 03/31/2021 0356   K 4.4 03/31/2021 0356   CL 99 03/31/2021 0356   CO2 22 03/31/2021 0356   GLUCOSE 170 (H) 03/31/2021 0356   BUN 29 (H) 03/31/2021 0356   CREATININE 2.63 (H) 03/31/2021 0356   CALCIUM 7.4 (L) 03/31/2021 0356   GFRNONAA 28 (L) 03/31/2021 0356   CBC    Component Value Date/Time   WBC 19.6 (H) 03/31/2021 0356   RBC 3.28 (L) 03/31/2021 0356   HGB 9.9 (L) 03/31/2021 0356   HCT 31.7 (L) 03/31/2021 0356   PLT 204 03/31/2021 0356   MCV 96.6 03/31/2021 0356   MCH 30.2 03/31/2021 0356   MCHC 31.2 03/31/2021 0356   RDW 19.6 (H) 03/31/2021 0356   LYMPHSABS 1.5 03/22/2021 1545   MONOABS 0.7 02/27/2021 1545   EOSABS 0.6 (H) 03/03/2021 1545   BASOSABS 0.1 03/01/2021 1545     Outpatient dialysis prescription: [facility] TTS [id number] 4 Hours, 180 dialyzer, 2K/2.5 Ca, DFR 500, BFR 400 Use AVF; graft on inner arm, 15 guage Heparin loading dose 2000 and hourly dose of 1200 EDW 78.5 kg Last post weight 78 kg on 5/21 (went in at 78 and came out at 78 kg) Meds: epogen 2400 units each tx venofer 50 mg weekly  Not on hectorol or calcitriol sensipar 30 mg three times a week with HD  Assessment/Plan:  1. ESRD - normally on [place] dialysis in [place], last HD 5/30, now on CRRT since 04/01/2021 following CABG complicated by PEA arrest and need for pressors. Still remains on pressors however he is feeling better.  1. CRRT stopped 03/30/21 2. Plan for IHD tomorrow to keep him on his outpatient schedule.  3. femoral HD catheter removed 03/30/21. 2. CAD s/p CABG x 4 complicated by PEA arrest. Currently stable 3. Acute on chronic CHF with cardiogenic shock post CABG - currently on levophed, milrinone, and started on midodrine. Ok to increase midodrine to 10 mg tid and wean levophed as tolerated. 4. Pulmonary HTN - per HF team 5. Cirrhosis - presumably cardiogenic. S/p paracentesis on 03/18/21. 6. Hyperkalemia -  stable 7. Anemia of ESRD - on ESA and transfuse prn. 8. Hypophosphatemia - repleted with IV phos and follow.    Donetta Potts, MD [employer] [phone]

## 2021-04-01 ENCOUNTER — Inpatient Hospital Stay (HOSPITAL_COMMUNITY): Payer: Medicare Other

## 2021-04-01 ENCOUNTER — Encounter (HOSPITAL_COMMUNITY): Payer: Self-pay | Admitting: Cardiothoracic Surgery

## 2021-04-01 DIAGNOSIS — J690 Pneumonitis due to inhalation of food and vomit: Secondary | ICD-10-CM

## 2021-04-01 DIAGNOSIS — Z9911 Dependence on respirator [ventilator] status: Secondary | ICD-10-CM

## 2021-04-01 DIAGNOSIS — H5704 Mydriasis: Secondary | ICD-10-CM

## 2021-04-01 DIAGNOSIS — Z9889 Other specified postprocedural states: Secondary | ICD-10-CM

## 2021-04-01 DIAGNOSIS — J80 Acute respiratory distress syndrome: Secondary | ICD-10-CM

## 2021-04-01 DIAGNOSIS — R401 Stupor: Secondary | ICD-10-CM

## 2021-04-01 DIAGNOSIS — R4189 Other symptoms and signs involving cognitive functions and awareness: Secondary | ICD-10-CM

## 2021-04-01 DIAGNOSIS — G9340 Encephalopathy, unspecified: Secondary | ICD-10-CM

## 2021-04-01 DIAGNOSIS — Z951 Presence of aortocoronary bypass graft: Secondary | ICD-10-CM

## 2021-04-01 DIAGNOSIS — I5023 Acute on chronic systolic (congestive) heart failure: Secondary | ICD-10-CM

## 2021-04-01 LAB — CBC WITH DIFFERENTIAL/PLATELET
Abs Immature Granulocytes: 1.98 10*3/uL — ABNORMAL HIGH (ref 0.00–0.07)
Basophils Absolute: 0.1 10*3/uL (ref 0.0–0.1)
Basophils Relative: 0 %
Eosinophils Absolute: 0.6 10*3/uL — ABNORMAL HIGH (ref 0.0–0.5)
Eosinophils Relative: 2 %
HCT: 33.2 % — ABNORMAL LOW (ref 39.0–52.0)
Hemoglobin: 9.9 g/dL — ABNORMAL LOW (ref 13.0–17.0)
Immature Granulocytes: 7 %
Lymphocytes Relative: 9 %
Lymphs Abs: 2.4 10*3/uL (ref 0.7–4.0)
MCH: 29.8 pg (ref 26.0–34.0)
MCHC: 29.8 g/dL — ABNORMAL LOW (ref 30.0–36.0)
MCV: 100 fL (ref 80.0–100.0)
Monocytes Absolute: 1.9 10*3/uL — ABNORMAL HIGH (ref 0.1–1.0)
Monocytes Relative: 7 %
Neutro Abs: 21.1 10*3/uL — ABNORMAL HIGH (ref 1.7–7.7)
Neutrophils Relative %: 75 %
Platelets: 326 10*3/uL (ref 150–400)
RBC: 3.32 MIL/uL — ABNORMAL LOW (ref 4.22–5.81)
RDW: 19.9 % — ABNORMAL HIGH (ref 11.5–15.5)
WBC: 28.1 10*3/uL — ABNORMAL HIGH (ref 4.0–10.5)
nRBC: 0.2 % (ref 0.0–0.2)

## 2021-04-01 LAB — CBC
HCT: 33.6 % — ABNORMAL LOW (ref 39.0–52.0)
Hemoglobin: 10.4 g/dL — ABNORMAL LOW (ref 13.0–17.0)
MCH: 30.3 pg (ref 26.0–34.0)
MCHC: 31 g/dL (ref 30.0–36.0)
MCV: 98 fL (ref 80.0–100.0)
Platelets: 300 10*3/uL (ref 150–400)
RBC: 3.43 MIL/uL — ABNORMAL LOW (ref 4.22–5.81)
RDW: 19.8 % — ABNORMAL HIGH (ref 11.5–15.5)
WBC: 25.9 10*3/uL — ABNORMAL HIGH (ref 4.0–10.5)
nRBC: 0.2 % (ref 0.0–0.2)

## 2021-04-01 LAB — POCT I-STAT 7, (LYTES, BLD GAS, ICA,H+H)
Acid-base deficit: 9 mmol/L — ABNORMAL HIGH (ref 0.0–2.0)
Acid-base deficit: 10 mmol/L — ABNORMAL HIGH (ref 0.0–2.0)
Acid-base deficit: 9 mmol/L — ABNORMAL HIGH (ref 0.0–2.0)
Bicarbonate: 21.4 mmol/L (ref 20.0–28.0)
Bicarbonate: 20 mmol/L (ref 20.0–28.0)
Bicarbonate: 19.9 mmol/L — ABNORMAL LOW (ref 20.0–28.0)
Calcium, Ion: 1.08 mmol/L — ABNORMAL LOW (ref 1.15–1.40)
Calcium, Ion: 1.1 mmol/L — ABNORMAL LOW (ref 1.15–1.40)
Calcium, Ion: 1.12 mmol/L — ABNORMAL LOW (ref 1.15–1.40)
HCT: 38 % — ABNORMAL LOW (ref 39.0–52.0)
HCT: 39 % (ref 39.0–52.0)
HCT: 39 % (ref 39.0–52.0)
Hemoglobin: 12.9 g/dL — ABNORMAL LOW (ref 13.0–17.0)
Hemoglobin: 13.3 g/dL (ref 13.0–17.0)
Hemoglobin: 13.3 g/dL (ref 13.0–17.0)
O2 Saturation: 96 %
O2 Saturation: 85 %
O2 Saturation: 86 %
Patient temperature: 98.2
Potassium: 4.1 mmol/L (ref 3.5–5.1)
Potassium: 3.6 mmol/L (ref 3.5–5.1)
Potassium: 3.6 mmol/L (ref 3.5–5.1)
Sodium: 129 mmol/L — ABNORMAL LOW (ref 135–145)
Sodium: 131 mmol/L — ABNORMAL LOW (ref 135–145)
Sodium: 132 mmol/L — ABNORMAL LOW (ref 135–145)
TCO2: 23 mmol/L (ref 22–32)
TCO2: 22 mmol/L (ref 22–32)
TCO2: 21 mmol/L — ABNORMAL LOW (ref 22–32)
pCO2 arterial: 67.9 mmHg (ref 32.0–48.0)
pCO2 arterial: 58.6 mmHg — ABNORMAL HIGH (ref 32.0–48.0)
pCO2 arterial: 53 mmHg — ABNORMAL HIGH (ref 32.0–48.0)
pH, Arterial: 7.106 — CL (ref 7.350–7.450)
pH, Arterial: 7.14 — CL (ref 7.350–7.450)
pH, Arterial: 7.18 — CL (ref 7.350–7.450)
pO2, Arterial: 110 mmHg — ABNORMAL HIGH (ref 83.0–108.0)
pO2, Arterial: 66 mmHg — ABNORMAL LOW (ref 83.0–108.0)
pO2, Arterial: 63 mmHg — ABNORMAL LOW (ref 83.0–108.0)

## 2021-04-01 LAB — BASIC METABOLIC PANEL
Anion gap: 11 (ref 5–15)
Anion gap: 12 (ref 5–15)
BUN: 43 mg/dL — ABNORMAL HIGH (ref 6–20)
BUN: 45 mg/dL — ABNORMAL HIGH (ref 6–20)
CO2: 21 mmol/L — ABNORMAL LOW (ref 22–32)
CO2: 18 mmol/L — ABNORMAL LOW (ref 22–32)
Calcium: 7.4 mg/dL — ABNORMAL LOW (ref 8.9–10.3)
Calcium: 7.2 mg/dL — ABNORMAL LOW (ref 8.9–10.3)
Chloride: 95 mmol/L — ABNORMAL LOW (ref 98–111)
Chloride: 98 mmol/L (ref 98–111)
Creatinine, Ser: 4.19 mg/dL — ABNORMAL HIGH (ref 0.61–1.24)
Creatinine, Ser: 4.33 mg/dL — ABNORMAL HIGH (ref 0.61–1.24)
GFR, Estimated: 16 mL/min — ABNORMAL LOW (ref >60)
GFR, Estimated: 15 mL/min — ABNORMAL LOW (ref >60)
Glucose, Bld: 341 mg/dL — ABNORMAL HIGH (ref 70–99)
Glucose, Bld: 72 mg/dL (ref 70–99)
Potassium: 5.2 mmol/L — ABNORMAL HIGH (ref 3.5–5.1)
Potassium: 3.5 mmol/L (ref 3.5–5.1)
Sodium: 127 mmol/L — ABNORMAL LOW (ref 135–145)
Sodium: 128 mmol/L — ABNORMAL LOW (ref 135–145)

## 2021-04-01 LAB — HEPARIN LEVEL (UNFRACTIONATED)
Heparin Unfractionated: 0.22 IU/mL — ABNORMAL LOW (ref 0.30–0.70)
Heparin Unfractionated: 0.11 IU/mL — ABNORMAL LOW (ref 0.30–0.70)
Heparin Unfractionated: 0.11 IU/mL — ABNORMAL LOW (ref 0.30–0.70)

## 2021-04-01 LAB — GLUCOSE, CAPILLARY
Glucose-Capillary: 159 mg/dL — ABNORMAL HIGH (ref 70–99)
Glucose-Capillary: 172 mg/dL — ABNORMAL HIGH (ref 70–99)
Glucose-Capillary: 298 mg/dL — ABNORMAL HIGH (ref 70–99)
Glucose-Capillary: 297 mg/dL — ABNORMAL HIGH (ref 70–99)
Glucose-Capillary: 275 mg/dL — ABNORMAL HIGH (ref 70–99)
Glucose-Capillary: 42 mg/dL — CL (ref 70–99)
Glucose-Capillary: 78 mg/dL (ref 70–99)

## 2021-04-01 LAB — ECHOCARDIOGRAM COMPLETE
AR max vel: 2.41 cm2
AV Area VTI: 2.41 cm2
AV Area mean vel: 2.1 cm2
AV Mean grad: 5 mmHg
AV Peak grad: 8.4 mmHg
Ao pk vel: 1.45 m/s
Area-P 1/2: 4.54 cm2
Height: 74 in
S' Lateral: 3.8 cm
Weight: 2761.92 oz

## 2021-04-01 LAB — PROTIME-INR
INR: 1.3 — ABNORMAL HIGH (ref 0.8–1.2)
Prothrombin Time: 16.2 seconds — ABNORMAL HIGH (ref 11.4–15.2)

## 2021-04-01 LAB — COMPREHENSIVE METABOLIC PANEL
ALT: 28 U/L (ref 0–44)
AST: 56 U/L — ABNORMAL HIGH (ref 15–41)
Albumin: 2.5 g/dL — ABNORMAL LOW (ref 3.5–5.0)
Alkaline Phosphatase: 249 U/L — ABNORMAL HIGH (ref 38–126)
Anion gap: 12 (ref 5–15)
BUN: 37 mg/dL — ABNORMAL HIGH (ref 6–20)
CO2: 22 mmol/L (ref 22–32)
Calcium: 7.2 mg/dL — ABNORMAL LOW (ref 8.9–10.3)
Chloride: 95 mmol/L — ABNORMAL LOW (ref 98–111)
Creatinine, Ser: 3.56 mg/dL — ABNORMAL HIGH (ref 0.61–1.24)
GFR, Estimated: 19 mL/min — ABNORMAL LOW (ref >60)
Glucose, Bld: 175 mg/dL — ABNORMAL HIGH (ref 70–99)
Potassium: 4.6 mmol/L (ref 3.5–5.1)
Sodium: 129 mmol/L — ABNORMAL LOW (ref 135–145)
Total Bilirubin: 1.8 mg/dL — ABNORMAL HIGH (ref 0.3–1.2)
Total Protein: 6 g/dL — ABNORMAL LOW (ref 6.5–8.1)

## 2021-04-01 LAB — COOXEMETRY PANEL
Carboxyhemoglobin: 2.1 % — ABNORMAL HIGH (ref 0.5–1.5)
Methemoglobin: 1.3 % (ref 0.0–1.5)
O2 Saturation: 86 %
Total hemoglobin: 10.6 g/dL — ABNORMAL LOW (ref 12.0–16.0)

## 2021-04-01 LAB — TROPONIN I (HIGH SENSITIVITY)
Troponin I (High Sensitivity): 99 ng/L — ABNORMAL HIGH (ref <18)
Troponin I (High Sensitivity): 111 ng/L (ref <18)

## 2021-04-01 LAB — VANCOMYCIN, RANDOM: Vancomycin Rm: 83

## 2021-04-01 LAB — MAGNESIUM
Magnesium: 2.4 mg/dL (ref 1.7–2.4)
Magnesium: 2.6 mg/dL — ABNORMAL HIGH (ref 1.7–2.4)

## 2021-04-01 LAB — PHOSPHORUS: Phosphorus: 4.2 mg/dL (ref 2.5–4.6)

## 2021-04-01 LAB — LACTIC ACID, PLASMA: Lactic Acid, Venous: 1.9 mmol/L (ref 0.5–1.9)

## 2021-04-01 MED ORDER — HEPARIN (PORCINE) 25000 UT/250ML-% IV SOLN
2050.0000 [IU]/h | INTRAVENOUS | Status: DC
  Administered 2021-04-01: 1800 [IU]/h via INTRAVENOUS
  Administered 2021-04-02: 2050 [IU]/h via INTRAVENOUS
  Administered 2021-04-02 (×2): 1900 [IU]/h via INTRAVENOUS
  Filled 2021-04-01 (×3): qty 250

## 2021-04-01 MED ORDER — MIDAZOLAM HCL 2 MG/2ML IJ SOLN
INTRAMUSCULAR | Status: AC
  Filled 2021-04-01: qty 2

## 2021-04-01 MED ORDER — ROCURONIUM BROMIDE 10 MG/ML (PF) SYRINGE
PREFILLED_SYRINGE | INTRAVENOUS | Status: AC
  Filled 2021-04-01: qty 10

## 2021-04-01 MED ORDER — IPRATROPIUM-ALBUTEROL 0.5-2.5 (3) MG/3ML IN SOLN
3.0000 mL | RESPIRATORY_TRACT | Status: DC
  Administered 2021-04-01 – 2021-04-02 (×9): 3 mL via RESPIRATORY_TRACT
  Filled 2021-04-01 (×9): qty 3

## 2021-04-01 MED ORDER — FENTANYL CITRATE (PF) 100 MCG/2ML IJ SOLN
INTRAMUSCULAR | Status: AC
  Filled 2021-04-01: qty 2

## 2021-04-01 MED ORDER — IOHEXOL 350 MG/ML SOLN
75.0000 mL | Freq: Once | INTRAVENOUS | Status: AC | PRN
  Administered 2021-04-01: 75 mL via INTRAVENOUS

## 2021-04-01 MED ORDER — VASOPRESSIN 20 UNITS/100 ML INFUSION FOR SHOCK
0.0000 [IU]/min | INTRAVENOUS | Status: DC
  Administered 2021-04-01 – 2021-04-06 (×12): 0.03 [IU]/min via INTRAVENOUS
  Filled 2021-04-01 (×12): qty 100

## 2021-04-01 MED ORDER — IOHEXOL 350 MG/ML SOLN
100.0000 mL | Freq: Once | INTRAVENOUS | Status: AC
  Administered 2021-04-01: 100 mL via INTRAVENOUS

## 2021-04-01 MED ORDER — DEXTROSE 50 % IV SOLN
INTRAVENOUS | Status: AC
  Administered 2021-04-01: 50 mL
  Filled 2021-04-01: qty 50

## 2021-04-01 MED ORDER — ORAL CARE MOUTH RINSE
15.0000 mL | OROMUCOSAL | Status: DC
  Administered 2021-04-01 – 2021-04-15 (×132): 15 mL via OROMUCOSAL

## 2021-04-01 MED ORDER — CHLORHEXIDINE GLUCONATE 0.12% ORAL RINSE (MEDLINE KIT)
15.0000 mL | Freq: Two times a day (BID) | OROMUCOSAL | Status: DC
  Administered 2021-04-01 – 2021-04-14 (×27): 15 mL via OROMUCOSAL

## 2021-04-01 MED ORDER — VANCOMYCIN VARIABLE DOSE PER UNSTABLE RENAL FUNCTION (PHARMACIST DOSING): Status: DC

## 2021-04-01 MED ORDER — VANCOMYCIN HCL 1500 MG/300ML IV SOLN
1500.0000 mg | Freq: Once | INTRAVENOUS | Status: AC
  Administered 2021-04-01: 1500 mg via INTRAVENOUS
  Filled 2021-04-01: qty 300

## 2021-04-01 MED ORDER — ETOMIDATE 2 MG/ML IV SOLN
INTRAVENOUS | Status: AC
  Filled 2021-04-01: qty 20

## 2021-04-01 MED ORDER — SODIUM ZIRCONIUM CYCLOSILICATE 10 G PO PACK
10.0000 g | PACK | Freq: Once | ORAL | Status: DC
  Filled 2021-04-01: qty 1

## 2021-04-01 MED ORDER — SODIUM CHLORIDE 0.9 % IV SOLN: INTRAVENOUS | Status: DC

## 2021-04-01 NOTE — Progress Notes (Signed)
[facility] Progress Note Patient Name: David Rose. DOB: April 09, 1966 MRN: [id number]   Date of Service  04/01/2021  HPI/Events of Note  Patient on max doses of Norepinephrine gtt + Vasopressin with MAP of 63 mmHg, he needs a KUB to  r/o abdomino-pelvic retained metals prior to going to MRI, K+ is 3.6 and he has a scheduled Lokelma dose.  eICU Interventions  Okay to hold MRI trip until patient more hemodynamically stable, Lokelma discontinued, KUB ordered.        [name] Campbell Kray 04/01/2021, 10:50 PM

## 2021-04-01 NOTE — Progress Notes (Signed)
Pt transported on ventilator from [place] 14 to CT 2 and back. RT x2 and Rn x2 accompanied pt. Vital signs stable throughout. RT will continue to monitor.

## 2021-04-01 NOTE — Procedures (Signed)
Intubation Procedure Note  Zadien Isensee  [id number]  Aug 26, 1966  Date:04/01/21  Time:1:40 PM   Provider Performing:Drevion Offord Naomie Dean    Procedure: Intubation ([id number])  Indication(s) Respiratory Failure  Consent Unable to obtain consent due to emergent nature of procedure.   Anesthesia Fentanyl and Rocuronium   Time Out Verified patient identification, verified procedure, site/side was marked, verified correct patient position, special equipment/implants available, medications/allergies/relevant history reviewed, required imaging and test results available.   Sterile Technique Usual hand hygeine, masks, and gloves were used   Procedure Description Patient positioned in bed supine.  Sedation given as noted above.  Patient was intubated with endotracheal tube using Glidescope.  View was Grade 1 full glottis .  Number of attempts was 2-- aspirated during first attemp.  Colorimetric CO2 detector was consistent with tracheal placement.   Complications/Tolerance None; patient tolerated the procedure well. Chest X-ray is ordered to verify placement.   EBL Minimal   Specimen(s) None  Julian Hy, DO 04/01/21 1:41 PM [facility]

## 2021-04-01 NOTE — Progress Notes (Signed)
This chaplain responded to RN-Sarah page for spiritual care assistance.  Chaplain Vincella and Harrie Foreman offered a spiritual care presence to the Pt. family as they moved to the waiting room.  This chaplain introduced herself again, clarifying the Pt. last name, noting the critical event. The chaplain re-affirmed the medical team's plan to update her as soon as possible.  The chaplain was able to retrieve the cell phone left in the chair and bring it and water to the family.  The family is appreciative of the spiritual care, but communicated no further needs at this time.  The chaplain offered F/U spiritual care as needed.

## 2021-04-01 NOTE — Code Documentation (Signed)
Stroke Response Nurse Documentation Code Documentation  Krystian Poliseno. is a 55 y.o. male admitted to [facility] on 5/23 for HF with CABG completed with past medical hx of HTN, PAD,  ESRD, and CHF. Code stroke was activated by 2H after patient was noted to have a suddne onset of unresponsive and dilated pupils discovered at 1250. Pt was LKW at 1220 when RN went to lunch. He had been at his baseline with no complaints.   Stroke team at the bedside on patient activation. Labs drawn and patient cleared for CT by MD after intubation. Patient to CT with team. NIHSS 33, see documentation for details and code stroke times. Patient with decreased LOC, disoriented, not following commands, bilateral, No blink to threat, hemianopia, bilateral arm weakness, bilateral leg weakness, bilateral decreased sensation, Global aphasia , and dysarthria  on exam. The following imaging was completed:   CT, CTA head and neck. Patient is not a candidate for tPA due to contraindication  and thought to not be neurological cause of condition per MD Quinn Axe.   Bedside handoff with Margreta Journey, RN.    Kathrin Greathouse  Stroke Response RN

## 2021-04-01 NOTE — Progress Notes (Signed)
Starting to wake up and follow commands. Winced when pacer wires were removed. Weakly moving fingers when asked to squeeze fingers and give give thumbs up. Nodding no to questions, opens eyes, but not tracking.   Chest tube now draining since retracted. Chest tube atrium marked at 1200 when it was starting to drain again.  Julian Hy, DO 04/01/21 4:31 PM [facility]

## 2021-04-01 NOTE — Progress Notes (Signed)
ANTICOAGULATION CONSULT NOTE  Pharmacy Consult for heparin Indication: atrial flutter  Allergies  Allergen Reactions   Shellfish-Derived Products Anaphylaxis   Wasp Venom Protein Anaphylaxis   Penicillins Other (See Comments)    Childhood reaction    Patient Measurements: Height: '6\' 2"'$  (188 cm) Weight: 78.3 kg (172 lb 9.9 oz) IBW/kg (Calculated) : 82.2 Heparin Dosing Weight: 79kg  Vital Signs: Temp: 98.2 F (36.8 C) (06/09 0800) Temp Source: Oral (06/09 0800) BP: 99/45 (06/09 1630) Pulse Rate: 102 (06/09 1630)  Labs: Recent Labs    03/30/21 0414 03/31/21 0214 03/31/21 0356 03/31/21 0951 03/31/21 2155 04/01/21 0325 04/01/21 0448 04/01/21 1328 04/01/21 1526  HGB 9.8*  --  9.9*  --   --  10.4*  --  9.9* 12.9*  HCT 31.1*  --  31.7*  --   --  33.6*  --  33.2* 38.0*  PLT 151  --  204  --   --  300  --  326  --   LABPROT 17.3*  --  17.9*  --   --   --   --  16.2*  --   INR 1.4*  --  1.5*  --   --   --   --  1.3*  --   HEPARINUNFRC  --    < >  --    < > <0.10* 0.22* 0.11*  --   --   CREATININE 1.73*  --  2.63*  --   --  3.56*  --  4.19*  --   TROPONINIHS  --   --   --   --   --   --   --  99*  --    < > = values in this interval not displayed.     Estimated Creatinine Clearance: 22.1 mL/min (A) (by C-G formula based on SCr of 4.19 mg/dL (H)).   Medical History: Past Medical History:  Diagnosis Date   ESRD (end stage renal disease) ([place])    Hypertension    Peripheral arterial disease ([place])    Type 2 diabetes mellitus ([place])    Assessment: 40 YOF with recent CABG now with postop afib/flutter. There was concern several days ago immediately post op for HIT as plt count dropped to 30s. Antibody was ordered and shows sent, actual lab/blood draw never actually sent out/received by lab although shows in process. Discussed with CCM/HF and ok with low dose heparin without bolusing for Aflutter and will disregard HIT antibody in process. Heparin started on 6/7 PM.    Earlier today heparin was held due to AMS concerning for stroke. Head CT negative. There was a concern that the patient may need a thoracentesis however now the chest tubes are draining. The patient is starting to follow some commands. CCM requested to resume Heparin drip. Will start a slightly lower dose and follow-up on a level in 8 hours. Plts up to 326  Goal of Therapy:  Heparin level 0.3-0.5  units/ml  Monitor platelets by anticoagulation protocol: Yes   Plan:  - Resume Heparin at 1800 units/hr (18 ml/hr) - Will continue to monitor for any signs/symptoms of bleeding and will follow up with heparin level in 8 hours   Thank you for allowing pharmacy to be a part of this patient's care.  Alycia Rossetti, PharmD, BCPS Clinical Pharmacist Clinical phone for 04/01/2021: [phone] 04/01/2021 4:48 PM   **Pharmacist phone directory can now be found on [email or web address] ([email or web address]).  Listed under [place].

## 2021-04-01 NOTE — Progress Notes (Signed)
ANTICOAGULATION CONSULT NOTE - Follow Up Consult  Pharmacy Consult for heparin Indication: atrial fibrillation/flutter  Labs: Recent Labs    03/30/21 0414 03/31/21 0214 03/31/21 0356 03/31/21 0951 04/01/21 0325 04/01/21 0448 04/01/21 1328 04/01/21 1526 04/01/21 1529 04/01/21 1801 04/01/21 2010 04/01/21 2212  HGB 9.8*  --  9.9*  --  10.4*  --  9.9* 12.9*  --  13.3 13.3  --   HCT 31.1*  --  31.7*  --  33.6*  --  33.2* 38.0*  --  39.0 39.0  --   PLT 151  --  204  --  300  --  326  --   --   --   --   --   LABPROT 17.3*  --  17.9*  --   --   --  16.2*  --   --   --   --   --   INR 1.4*  --  1.5*  --   --   --  1.3*  --   --   --   --   --   HEPARINUNFRC  --    < >  --    < > 0.22* 0.11*  --   --   --   --   --  0.11*  CREATININE 1.73*  --  2.63*  --  3.56*  --  4.19*  --   --   --   --  4.33*  TROPONINIHS  --   --   --   --   --   --  99*  --  111*  --   --   --    < > = values in this interval not displayed.     Assessment: 55yo male subtherapeutic on heparin after resuming; no gtt issues or signs of bleeding per RN.  Goal of Therapy:  Heparin level 0.3-0.7 units/ml   Plan:  Will increase heparin gtt slightly to 1900 units/hr and check level in 8 hours.    Wynona Neat, PharmD, BCPS  04/01/2021,11:21 PM

## 2021-04-01 NOTE — Progress Notes (Signed)
Patient ID: David Rose., male   DOB: [birth year], 55 y.o.   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  Based on low EF and elevated PA pressure, right and left heart cath was done in 5/22.  This showed severe 3VD, high cardiac output with moderate pulmonary hypertension but low PVR.  He is planned for CABG, but admitted pre-op for optimization.    Subjective:    CABG x 4 on 5/31 with LIMA-LAD, SVG-RCA, SVG-OM, SVG-D.   Coagulopathic post-op with multiple blood products.  Extubated post-op but developed respiratory distress => PEA arrest then VT. Reintubated.  ROSC with ACLS.  Returned to OR for mediastinal exploration 6/1.  Extubated 6/2.  6/6 Midodrine started.   CVP 7-8 this morning, co-ox 86%.  NE 7, SBP 90s.  Off CVVH now.   On Cefepime for HCAP, WBCs 26.   HIT sent 6/2 (pending) -->Plts 47 => 51 => 68=>91=>191=>204=>300.     OOB sitting up in chair. Seems more shaky today.      Objective:   Weight Range: 78.3 kg Body mass index is 22.16 kg/m.   Vital Signs:   Temp:  [97.3 F (36.3 C)-99.7 F (37.6 C)] 98.3 F (36.8 C) (06/09 0330) Pulse Rate:  [37-100] 98 (06/09 0645) Resp:  [13-25] 22 (06/09 0645) BP: (58-128)/(20-64) 102/54 (06/09 0645) SpO2:  [66 %-100 %] 97 % (06/09 0645) Weight:  [78.3 kg] 78.3 kg (06/09 0500) Last BM Date: 03/31/21  Weight change: Filed Weights   03/30/21 0500 03/31/21 0647 04/01/21 0500  Weight: 78.9 kg 75.8 kg 78.3 kg    Intake/Output:   Intake/Output Summary (Last 24 hours) at 04/01/2021 0714 Last data filed at 04/01/2021 0600 Gross per 24 hour  Intake 1777.09 ml  Output 120 ml  Net 1657.09 ml      Physical Exam   CVP 7-8 (sitting up in chair)   General: NAD Neck: No JVD, no thyromegaly or thyroid nodule.  Lungs: Clear to auscultation bilaterally with normal respiratory effort. CV: Nondisplaced PMI.  Heart regular S1/S2, no S3/S4, no murmur.  No peripheral edema.   Abdomen: Soft, nontender, no hepatosplenomegaly, no distention.  Skin: Intact without lesions or rashes.  Neurologic: Alert and oriented x 3.  Psych: Normal affect. Extremities: No clubbing or cyanosis.  HEENT: Normal.    Telemetry   Atrial flutter 90s (personally reviewed)  EKG    N/A  Labs    CBC Recent Labs    03/31/21 0356 04/01/21 0325  WBC 19.6* 25.9*  HGB 9.9* 10.4*  HCT 31.7* 33.6*  MCV 96.6 98.0  PLT 204 [id number]   Basic Metabolic Panel Recent Labs    03/31/21 0356 04/01/21 0325  NA 132* 129*  K 4.4 4.6  CL 99 95*  CO2 22 22  GLUCOSE 170* 175*  BUN 29* 37*  CREATININE 2.63* 3.56*  CALCIUM 7.4* 7.2*  MG 2.4 2.4  PHOS 3.0 4.2   Liver Function Tests Recent Labs    03/31/21 0356 04/01/21 0325  AST 88* 56*  ALT 30 28  ALKPHOS 259* 249*  BILITOT 2.4* 1.8*  PROT 5.8* 6.0*  ALBUMIN 2.5* 2.5*   No results for input(s): LIPASE,  AMYLASE in the last 72 hours. Cardiac Enzymes No results for input(s): CKTOTAL, CKMB, CKMBINDEX, TROPONINI in the last 72 hours.  BNP: BNP (last 3 results) Recent Labs    03/14/2021 1545  BNP 1,127.8*    ProBNP (last 3 results) No results for input(s): PROBNP in the last 8760 hours.   D-Dimer No results for input(s): DDIMER in the last 72 hours.  Hemoglobin A1C No results for input(s): HGBA1C in the last 72 hours. Fasting Lipid Panel No results for input(s): CHOL, HDL, LDLCALC, TRIG, CHOLHDL, LDLDIRECT in the last 72 hours. Thyroid Function Tests No results for input(s): TSH, T4TOTAL, T3FREE, THYROIDAB in the last 72 hours.  Invalid input(s): FREET3  Other results:   Imaging    No results found.    Medications:     Scheduled Medications:  (feeding supplement) PROSource Plus  30  mL Oral TID BM   amiodarone  200 mg Oral BID   aspirin EC  81 mg Oral Daily   atorvastatin  40 mg Oral Daily   bisacodyl  10 mg Oral Daily   Or   bisacodyl  10 mg Rectal Daily   Chlorhexidine Gluconate Cloth  6 each Topical Daily   cinacalcet  30 mg Oral Q T,Th,Sa-HD   darbepoetin (ARANESP) injection - NON-DIALYSIS  60 mcg Subcutaneous Q Thu-1800   docusate sodium  200 mg Oral Daily   feeding supplement  1 Container Oral TID WC   feeding supplement  237 mL Oral TID BM   insulin aspart  0-24 Units Subcutaneous Q4H   insulin glargine  10 Units Subcutaneous Daily   mouth rinse  15 mL Mouth Rinse BID   midodrine  15 mg Oral TID WC   multivitamin  1 tablet Oral QHS   pantoprazole  40 mg Oral Daily   polyethylene glycol  17 g Oral Daily   sildenafil  20 mg Oral TID   sodium chloride flush  10 mL Intracatheter Q8H   sodium chloride flush  10-40 mL Intracatheter Q12H   sodium chloride flush  3 mL Intravenous Q12H   traMADol  50-100 mg Oral Q12H    Infusions:  ceFEPime (MAXIPIME) IV Stopped (03/31/21 1747)   heparin 1,900 Units/hr (04/01/21 0600)   lactated ringers     lactated ringers 10 mL/hr at 04/01/21 0600   norepinephrine (LEVOPHED) Adult infusion 7 mcg/min (04/01/21 0600)    PRN Medications: lactated ringers, lidocaine (PF), lip balm, metoprolol tartrate, morphine injection, ondansetron (ZOFRAN) IV, oxyCODONE, sodium chloride flush, sodium chloride flush     Assessment/Plan   1. CAD: Severe 3VD with decreased EF.  I reviewed films with Dr. Ellyn Hack, PCI would be possible but would be difficult/high risk with multiple lesions and heavy calcification. 5/31 LIMA-LAD, SVG-RCA, SVG-OM, SVG-D. - Continue ASA 81.  - Continue atorvastatin 40 mg daily.  2. Acute/chronic HF with mid range EF => cardiogenic shock post-CABG: Suspect ischemic cardiomyopathy.  Echo 4/22 with EF 40-45% with moderately decreased RV systolic function and PASP 99, moderate TR. There was a prominent component  of RV failure with RA pressure elevated out of proportion to PCWP (CVP/PCWP 0.875 on RHC) but PAPI adequate at 2.8. Post-op had PEA arrest then VT. Currently on NE 7, still with SBP 90s.  CO-OX 86%.  CVP 7-8 off CVVH.  - Continue midodrine 15 mg tid.  - Continue to wean NE as able.    - iHD today.  - Will get echo to assess LV/RV function, persistent hypotension.  3. ESRD: Suspected due to diabetes.  Volume up post-op with multiple blood products. Now off CVVH.  - iHD today.  4. 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  The CO was not markedly high.  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 Oxygen saturation was 99% on RA pre-op, so no evidence for hepatopulmonary syndrome.   Moderate PH currently.  - Continue sildenafil 20 mg tid  5. Cirrhosis: Noted on abdominal US from 2020.  H/o paracentesis.  Had workup at [facility] (though patient does not remember this), viral hepatitis labs negative.  They ended up think that the cirrhosis was cardiogenic.  Repeat abdominal US 5/23 w/ nodular hepatic parenchymal pattern with increased echogenicity consistent with the patient's known cirrhosis. No focal hepatic abnormality identified. Portal vein is patent. Moderate ascites. GI has seen, patient had 4L paracentesis on 5/26. 6. Coagulopathy: Post-op bleeding in setting of coagulopathy post-CABG in patient with cirrhosis.  Currently seems controlled.  Platelets dropped, HIT sent => plts recovered to 300, think drop may have been due to sepsis.  7. VT: Had after PEA arrest am 6/1.  - On amiodarone 200 mg bid.  8. Anemia: ABLA.  Stable today. Hgb stable. .   9. Pleural effusions: Right effusion with chest tube, ?hepatic hydrothorax.  CT drainage slowing.     10. PEA arrest: Respiratory arrest post-extubation.  ROSC with CPR.  11. Acute hypoxemic respiratory failure:  Extubated 6/2 . On room air with  stable sats.  12. Atrial fibrillation: Atypical atrial flutter currently.   - Continue amiodarone.  - heparin gtt started 6/7, discussed with CCM and do not think HIT.  - Eventual DCCV.  13. ID: Cefepime for ?sepsis syndrome/HCAP.  Afebrile, WBCs 26.   CRITICAL CARE Performed by: Loralie Champagne  Total critical care time: 35 minutes  Critical care time was exclusive of separately billable procedures and treating other patients.  Critical care was necessary to treat or prevent imminent or life-threatening deterioration.  Critical care was time spent personally by me on the following activities: development of treatment plan with patient and/or surrogate as well as nursing, discussions with consultants, evaluation of patient's response to treatment, examination of patient, obtaining history from patient or surrogate, ordering and performing treatments and interventions, ordering and review of laboratory studies, ordering and review of radiographic studies, pulse oximetry and re-evaluation of patient's condition.   Loralie Champagne  04/01/2021 7:14 AM

## 2021-04-01 NOTE — Progress Notes (Signed)
Vent check delayed due to pt intubated and immediately taken to CT. Check obtained upon arrival back to unit.

## 2021-04-01 NOTE — Progress Notes (Signed)
RT assisted Dr. Carlis Abbott with bedside intubation. Pt bagged with 100% FiO2 and peep valve. 7.5 ETT placed at 25cm at the lip with bilateral breath sounds. Positive color change with ETCO2 monitoring. CXR pending at this time. ETT secured with hollister tube holder and pt placed on the ventilator. RT will continue to monitor and be available as needed.

## 2021-04-01 NOTE — Progress Notes (Signed)
Pharmacy Antibiotic Note  David Rose. is a 55 y.o. male admitted on 03/19/2021 with concern for sepsis.  Pharmacy has been consulted to add Vancomycin to Cefepime for coverage.   ESRD - had been on CRRT but stopped with plans to transition to IHD today. The patient was last noted to receive 1500 mg x 1 on 6/5 @ 1400 and was on CRRT for ~48 after the dose was given prior to stopping. Estimated level at stopping of CRRT is ~5 mcg/ml. IHD was not attempted today due to work up of AMS and concern for stroke.   Plan: - Vanc 1500 mg x 1 (to get estimated levels up to ~25 mcg/ml) - will check a random in the AM to confirm - No standing Vanc - f/u RRT (HD vs CRRT) plans - Will continue to follow renal function, culture results, LOT, and antibiotic de-escalation plans   Height: '6\' 2"'$  (188 cm) Weight: 78.3 kg (172 lb 9.9 oz) IBW/kg (Calculated) : 82.2  Temp (24hrs), Avg:98.4 F (36.9 C), Min:97.3 F (36.3 C), Max:99.7 F (37.6 C)  Recent Labs  Lab 03/29/21 0325 03/29/21 1533 03/30/21 0414 03/31/21 0356 04/01/21 0325 04/01/21 1328  WBC 17.5*  --  13.7* 19.6* 25.9* 28.1*  CREATININE 1.53* 1.61* 1.73* 2.63* 3.56* 4.19*    Estimated Creatinine Clearance: 22.1 mL/min (A) (by C-G formula based on SCr of 4.19 mg/dL (H)).    Allergies  Allergen Reactions   Shellfish-Derived Products Anaphylaxis   Wasp Venom Protein Anaphylaxis   Penicillins Other (See Comments)    Childhood reaction    Vanc 6/5 x 1; restart 6/9 >> Cefepime 6/5 >>  6/5 MRSA PCR >> neg  Thank you for allowing pharmacy to be a part of this patient's care.  Alycia Rossetti, PharmD, BCPS Clinical Pharmacist Clinical phone for 04/01/2021: [phone] 04/01/2021 5:32 PM   **Pharmacist phone directory can now be found on [email or web address] ([email or web address]).  Listed under [place].

## 2021-04-01 NOTE — Progress Notes (Addendum)
NAME:  David Rose., MRN:  [id number], DOB:  [birth year], LOS: 20 ADMISSION DATE:  03/18/2021, CONSULTATION DATE:  04/03/2021 REFERRING MD:  TCTS CHIEF COMPLAINT: Code   History of Present Illness:  55 year old man with PMHx significant for HTN, T2DM, PAD, ESRD, cirrhosis (query cardiac etiology) who presented for symptomatic ischemic cardiomyopathy.    Patient underwent preoperative optimization with CHF, GI, and Nephrology teams with eventual CABG 5/31. On 6/1, patient developed respiratory distress followed by PEA arrest requiring CPR. Code was c/b increased bleeding around surgical sites, prompting mediastinal exploration 6/1. Intraoperative findings were notable for bleeding around mammary graft site and diffuse oozing for which patient received multiple blood products.   Patient remained intubated on pressors postoperatively and PCCM was consulted for assistance with management.  Pertinent Medical History:  ESRD on HD Cardiac cirrhosis with portal hypertension IBS IDDM  Significant Hospital Events: Including procedures, antibiotic start and stop dates in addition to other pertinent events   5/23 Admitted 5/26 Paracentesis 4L 5/30 HD 5/31 CABG 5/31- 6/1 Code Blue, OR for mediastinal exploration 2/2 hemorrhagic shock induced by CPR. Factor 7, protamine, FFP, cryo given 6/1 PCCM consult, 4U PRBCs and 1U Plt . R pigtail chest tube placed due to limitation of vent weaning. 6/2 Extubated, chest tube 3.2L out past 24 hrs 6/3 Remains on 2L [place], chest tube with 500cc out/ 24 hrs 6/4 Remains on CRRT, 2L [place], milrinone, NE. 3 chest tubes remain. 6/6 Remains on CRRT, 4 chest tubes in place 6/9 Decreasing pressor requirements and CT output. CRRT transitioned to iHD. Echo repeated. Prior to HD session, patient suddenly became unresponsive with fixed and dilated pupils, minimal gag, breathing spontaneously. Code Stroke called. Reintubated, heparin d/c'ed, STAT CT Head/CTA.  Interim History /  Subjective:  Just finished working with PT on AM rounds, tolerated well C/o mild chest pressure, resolved with rest No significant c/o pain Helped to adjust position for optimal CT drainage Aware of Echo, iHD today WBC count up, d/w Dr. Orvan Seen  At approximately 1315, patient noted to be minimally responsive with fixed and dilated pupils Code Stroke called Please see note from Dr. Carlis Abbott dated 6/9  Objective   Blood pressure (!) 63/42, pulse 94, temperature 98.2 F (36.8 C), temperature source Oral, resp. rate 18, height '6\' 2"'$  (1.88 m), weight 78.3 kg, SpO2 98 %. 4LNC CVP:  [4 mmHg-15 mmHg] 6 mmHg  Vent Mode: PRVC FiO2 (%):  [100 %] 100 % Set Rate:  [18 bmp] 18 bmp Vt Set:  [650 mL] 650 mL PEEP:  [10 cmH20] 10 cmH20   Intake/Output Summary (Last 24 hours) at 04/01/2021 1408 Last data filed at 04/01/2021 1200 Gross per 24 hour  Intake 1010.37 ml  Output 100 ml  Net 910.37 ml    Filed Weights   03/30/21 0500 03/31/21 0647 04/01/21 0500  Weight: 78.9 kg 75.8 kg 78.3 kg   Physical Examination: (6/9AM) General: Chronically ill-appearing middle-aged man in NAD. HEENT: [place]/AT, anicteric sclera, PERRL, dry mucous membranes. [place] in place. Neuro: Awake, oriented x 4. Responds to verbal stimuli. Following commands consistently. Moves all 4 extremities spontaneously.  CV: RRR, no m/g/r. Midline sternotomy, well-healing. PULM: Breathing even and unlabored on 2L [place]. Upper lung fields clear, diminished at bases R > L, scattered crackles. GI: Soft, nontender, nondistended. Normoactive bowel sounds. Extremities: No LE edema noted. Skin: Warm/dry, no rashes.  Labs/imaging that I have personally reviewed: (right click and "Reselect all SmartList Selections" daily)  AM Labs: WBC 25.9 (19.6),  H&H 10.4/33.6, Plt 300 (204)  Na 129 (132), K 4.6, CO2 22, BUN 37, Cr 3.5 (2.6) Ca 7.2, Mg 2.4  Co-ox 86.0%  Post-Code Stroke: WBC 28.1 (25.9), H&H 9.9/33.2, Plt 326  INR 1.3  BMP, Mg, Phos  pending  Troponin, LA pending  Resolved Hospital Problem list     Assessment & Plan:  Acute encephalopathy prompting Code Stroke Patient became acutely unresponsive 6/9 with fixed and dilated pupils, minimal gag, breathing spontaneously. Code Stroke called. Reintubated, heparin d/c'ed, STAT CT Head/CTA. - Code Stroke initiated - Neurology consulted, appreciate recs - CT Head, CTA Head/Neck, CTA Chest pending - F/u code labs - EEG to be obtained - Family updated at bedside by Dr. Carlis Abbott 6/9  Acute hypoxemic respiratory failure - improved Bilateral pleural effusions;  inferior R sided pigtail tube in place; surgical CT removed 6/7 Likely LLL HAP - Postoperative care per TCTS, managing CT - Continue full vent support (4-8cc/kg IBW) - Wean FiO2 for O2 sat > 90% - Daily WUA/SBT when clinically appropriate - VAP bundle - Pulmonary hygiene (IS, flutter valve) - PAD protocol for sedation - Continue Cefepime for ?HCAP  Post-op mediastinal hemorrhage Cirrhotic and hemorrhage- induced consumptive coagulopathy Hemorrhagic shock, resolved Acute blood loss anemia (improving) Thrombocytopenia- (Improving): in setting of blood loss, coagulopathy, and cirrhosis P: - Trend CBC - Transfuse for Hgb < 7.0 - Heparin discontinued in the setting of ?stroke, r/o bleed  In hospital cardiac arrest precipitated by respiratory distress- PEA, minimal downtime. 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 VT- On 6/1 - Appreciate HF team assistance - F/u repeat Echo 6/9 - Goal MAP 65 - Titrate Levophed to MAP goal - Continue midodrine '15mg'$  TID - Trend Co-ox - Heparin gtt HELD in the setting of ?stroke - Continue ASA, statin - Sildenafil for pulmonary HTN  ESRD on HD PTA: known IJ stenosis and concern of R IJ stenosis; difficult access, RUE AVF - Nephro following, appreciate recs - Plan for iHD 6/9 prior to Code  Stroke, reassess once workup complete - Trend BMP - Replete electrolytes as indicated - Monitor I&Os - Avoid nephrotoxic agents as able - Ensure adequate renal perfusion  DM2, controlled - SSI - CBG Q4H while NPO  Best practice (right click and "Reselect all SmartList Selections" daily)  Diet:  NPO Pain/Anxiety/Delirium protocol (if indicated): Yes (RASS goal 0) VAP protocol (if indicated): Yes DVT prophylaxis: Systemic AC - discontinued at the time of Code Stroke GI prophylaxis: PPI Glucose control:  SSI Yes Central venous access:  Yes, and it is still needed Arterial line:  N/A  Foley:  N/A Mobility:  bed rest  PT consulted: Yes Last date of multidisciplinary goals of care discussion [briefly 6/9 in the setting of Code Stroke] Code Status:  full code Disposition: per primary  Critical care time: 61 minutes   Lestine Mount, PA-C  Pulmonary & Critical Care 04/01/21 2:31 PM  Please see [email or web address] for pager details.  From 7A-7P if no response, please call [phone] After hours, please call ELink [phone]

## 2021-04-01 NOTE — Progress Notes (Signed)
RT NOTE:  VT increased to 570 mls (7cc) following ABG. Dr. Gleason made aware that peak & plateau pressures are increased and OK with change.

## 2021-04-01 NOTE — Progress Notes (Signed)
ANTICOAGULATION CONSULT NOTE - Follow Up Consult  Pharmacy Consult for heparin Indication: atrial fibrillation/flutter  Labs: Recent Labs    03/30/21 0414 03/31/21 0214 03/31/21 0356 03/31/21 0951 03/31/21 2155 04/01/21 0325 04/01/21 0448  HGB 9.8*  --  9.9*  --   --  10.4*  --   HCT 31.1*  --  31.7*  --   --  33.6*  --   PLT 151  --  204  --   --  300  --   LABPROT 17.3*  --  17.9*  --   --   --   --   INR 1.4*  --  1.5*  --   --   --   --   HEPARINUNFRC  --    < >  --    < > <0.10* 0.22* 0.11*  CREATININE 1.73*  --  2.63*  --   --  3.56*  --    < > = values in this interval not displayed.    Assessment: 55yo male remains subtherapeutic on heparin after rate change; no gtt issues or signs of bleeding per RN; Plt count continues to increase.  Goal of Therapy:  Heparin level 0.3-0.7 units/ml   Plan:  Will increase heparin gtt by 3 units/kg/hr to 1900 units/hr and check level in 8 hours.    Wynona Neat, PharmD, BCPS  04/01/2021,5:39 AM

## 2021-04-01 NOTE — Consult Note (Signed)
Neurology Consultation  Reason for Consult: Acutely unresponsive with dilated and fixed pupils Referring Physician: Dr. Carlis Abbott  CC: Acute unresponsiveness  History is obtained from: Bedside RN, attending physician, and chart review. Unable to obtain history from patient due to patient condition.   HPI: David Rose. is a 55 y.o. male with a medical history significant for hypertension, end stage renal disease on hemodialysis Tuesday/Thursday/Saturday, peripheral arterial disease, coronary artery disease, type 2 diabetes mellitus, and heart failure who was admitted to the hospital on 5/23 for further work up, CABG evaluation, and pre-operative optimization after an outpatient cardiac catheterization revealed multivessel coronary artery disease and biventricular dysfunction. Following CABG x 4 procedure on 02/27/2021, David Rose experienced respiratory distress and had a PEA arrest with 8 minutes until ROSC achieved. His hospitalization has since been complicated by thrombocytopenia, coagulopathy, HCAP, pleural effusions requiring a right chest tube placement, atrial fibrillation / atrial flutter, and hypotension requiring pressors.   On 04/01/2021, David Rose had been sitting up eating lunch when he called his family and told them that something was wrong and he "felt like he was going to die". Family came to visit immediately and noted that David Rose was not responsive. When RN assessed patient, he was unresponsive to painful stimuli and his pupils were dilated and fixed. A code stroke was activated and David Rose was taken for a CT as soon as he was intubated for airway protection for further neurologic evaluation. Of note, David Rose has had ongoing problems with hypotension but during the event, he required much higher doses of pressor medications for stable blood pressures.   LKW: 12:50 tpa given?: no, patient on heparin gtt prior to acute unresponsive state IR Thrombectomy? No, imaging  without evidence of large vessel occlusion Modified Rankin Scale: 4-Needs assistance to walk and tend to bodily needs patient lives with his sister, walks with a cane and has difficulty with his balance  ROS: Unable to obtain due to altered mental status.   Past Medical History:  Diagnosis Date   ESRD (end stage renal disease) ([place])    Hypertension    Peripheral arterial disease ([place])    Type 2 diabetes mellitus ([place])    Past Surgical History:  Procedure Laterality Date   AV FISTULA PLACEMENT Right    CORONARY ARTERY BYPASS GRAFT N/A 03/04/2021   Procedure: CORONARY ARTERY BYPASS GRAFTING (CABG)X4. LEFT INTERNAL MAMMARY ARTERY. RIGHT ENDOSCOPIC SAPHENOUS VEIN HARVESTING.;  Surgeon: Wonda Olds, MD;  Location: [facility];  Service: Open Heart Surgery;  Laterality: N/A;   EXPLORATION POST OPERATIVE OPEN HEART N/A 04/14/2021   Procedure: EXPLORATION POST OPERATIVE OPEN HEART;  Surgeon: Wonda Olds, MD;  Location: [place] [facility];  Service: Thoracic;  Laterality: N/A;   IR PARACENTESIS  03/18/2021   PARACENTESIS     RIGHT HEART CATH N/A 03/23/2021   Procedure: RIGHT HEART CATH;  Surgeon: Larey Dresser, MD;  Location: [facility];  Service: Cardiovascular;  Laterality: N/A;   RIGHT/LEFT HEART CATH AND CORONARY ANGIOGRAPHY N/A 02/24/2021   Procedure: RIGHT/LEFT HEART CATH AND CORONARY ANGIOGRAPHY;  Surgeon: Troy Sine, MD;  Location: [facility];  Service: Cardiovascular;  Laterality: N/A;   TEE WITHOUT CARDIOVERSION N/A 03/13/2021   Procedure: TRANSESOPHAGEAL ECHOCARDIOGRAM (TEE);  Surgeon: Wonda Olds, MD;  Location: [facility];  Service: Open Heart Surgery;  Laterality: N/A;   Family History  Problem Relation Age of Onset   Thyroid cancer Mother    Leukemia Father  Social History:   reports that he has never smoked. He has never used smokeless tobacco. No history on file for alcohol use and drug use.  Medications  Current Facility-Administered Medications:     (feeding supplement) PROSource Plus liquid 30 mL, 30 mL, Oral, TID BM, Atkins, Broadus Z, MD, 30 mL at 04/01/21 1012   0.9 %  sodium chloride infusion, , Intravenous, Continuous, Clark, Laura P, DO   amiodarone (PACERONE) tablet 200 mg, 200 mg, Oral, BID, Larey Dresser, MD, 200 mg at 04/01/21 1013   aspirin EC tablet 81 mg, 81 mg, Oral, Daily, Atkins, Glenice Bow, MD, 81 mg at 04/01/21 1014   atorvastatin (LIPITOR) tablet 40 mg, 40 mg, Oral, Daily, Gold, Wayne E, PA-C, 40 mg at 04/01/21 1014   bisacodyl (DULCOLAX) EC tablet 10 mg, 10 mg, Oral, Daily, 10 mg at 03/31/21 0900 **OR** bisacodyl (DULCOLAX) suppository 10 mg, 10 mg, Rectal, Daily, Gold, Wayne E, PA-C, 10 mg at 04/13/2021 1351   ceFEPIme (MAXIPIME) 1 g in sodium chloride 0.9 % 100 mL IVPB, 1 g, Intravenous, Q24H, Lyndee Leo, RPH, Stopped at 03/31/21 1747   chlorhexidine gluconate (MEDLINE KIT) (PERIDEX) 0.12 % solution 15 mL, 15 mL, Mouth Rinse, BID, Clark, Laura P, DO   Chlorhexidine Gluconate Cloth 2 % PADS 6 each, 6 each, Topical, Daily, Wonda Olds, MD, 6 each at 04/01/21 1015   cinacalcet (SENSIPAR) tablet 30 mg, 30 mg, Oral, Q T,Th,Sa-HD, Gold, Wayne E, PA-C, 30 mg at 03/30/21 1129   Darbepoetin Alfa (ARANESP) injection 60 mcg, 60 mcg, Subcutaneous, Q Thu-1800, Elmarie Shiley, MD   docusate sodium (COLACE) capsule 200 mg, 200 mg, Oral, Daily, Gold, Wayne E, PA-C, 200 mg at 04/01/21 1013   feeding supplement (BOOST / RESOURCE BREEZE) liquid 1 Container, 1 Container, Oral, TID WC, Atkins, Glenice Bow, MD, 1 Container at 04/01/21 1129   feeding supplement (ENSURE ENLIVE / ENSURE PLUS) liquid 237 mL, 237 mL, Oral, TID BM, Atkins, Broadus Z, MD, 237 mL at 04/01/21 1012   heparin ADULT infusion 100 units/mL (25000 units/28m), 1,800 Units/hr, Intravenous, Continuous, MRolla Flatten [facility] Last Rate: 18 mL/hr at 04/01/21 1652, 1,800 Units/hr at 04/01/21 1652   insulin aspart (novoLOG) injection 0-24 Units, 0-24 Units, Subcutaneous,  Q4H, Atkins, BGlenice Bow MD, 12 Units at 04/01/21 1559   insulin glargine (LANTUS) injection 10 Units, 10 Units, Subcutaneous, Daily, CJulian Hy DO, 10 Units at 04/01/21 1012   ipratropium-albuterol (DUONEB) 0.5-2.5 (3) MG/3ML nebulizer solution 3 mL, 3 mL, Nebulization, Q4H, Clark, Laura P, DO   lactated ringers infusion 500 mL, 500 mL, Intravenous, Once PRN, Gold, Wayne E, PA-C   lactated ringers infusion, , Intravenous, Continuous, Gold, Wayne E, PA-C, Last Rate: 20 mL/hr at 04/01/21 1500, Infusion Verify at 04/01/21 1500   lidocaine (PF) (XYLOCAINE) 1 % injection, , , PRN, Bruning, Kevin, PA-C, 10 mL at 03/18/21 1500   lip balm (CARMEX) ointment, , Topical, PRN, AWonda Olds MD, Given at 03/25/21 2247   MEDLINE mouth rinse, 15 mL, Mouth Rinse, 10 times per day, CNoemi ChapelP, DO, 15 mL at 04/01/21 1600   metoprolol tartrate (LOPRESSOR) injection 2.5-5 mg, 2.5-5 mg, Intravenous, Q2H PRN, Gold, Wayne E, PA-C   midodrine (PROAMATINE) tablet 15 mg, 15 mg, Oral, TID WC, Atkins, Broadus Z, MD, 15 mg at 04/01/21 1131   morphine 2 MG/ML injection 1-4 mg, 1-4 mg, Intravenous, Q1H PRN, Gold, Wayne E, PA-C, 2 mg at 03/27/21 0021   multivitamin (RENA-VIT)  tablet 1 tablet, 1 tablet, Oral, QHS, Atkins, Glenice Bow, MD, 1 tablet at 03/31/21 2136   norepinephrine (LEVOPHED) 16 mg in 254m premix infusion, 0-40 mcg/min, Intravenous, Titrated, Atkins, BGlenice Bow MD, Last Rate: 37.5 mL/hr at 04/01/21 1500, 40 mcg/min at 04/01/21 1500   ondansetron (ZOFRAN) injection 4 mg, 4 mg, Intravenous, Q6H PRN, Gold, Wayne E, PA-C, 4 mg at 03/31/21 04174  oxyCODONE (Oxy IR/ROXICODONE) immediate release tablet 5-10 mg, 5-10 mg, Oral, Q3H PRN, Gold, Wayne E, PA-C, 5 mg at 04/01/21 0759   pantoprazole (PROTONIX) EC tablet 40 mg, 40 mg, Oral, Daily, Gold, Wayne E, PA-C, 40 mg at 04/01/21 1014   polyethylene glycol (MIRALAX / GLYCOLAX) packet 17 g, 17 g, Oral, Daily, Atkins, BGlenice Bow MD, 17 g at 03/30/21 00814   sildenafil (REVATIO) tablet 20 mg, 20 mg, Oral, TID, MLarey Dresser MD, 20 mg at 04/01/21 1014   sodium chloride flush (NS) 0.9 % injection 10 mL, 10 mL, Intracatheter, Q8H, Clark, Laura P, DO, 10 mL at 04/01/21 1016   sodium chloride flush (NS) 0.9 % injection 10-40 mL, 10-40 mL, Intracatheter, Q12H, Atkins, BGlenice Bow MD, 10 mL at 03/30/21 2126   sodium chloride flush (NS) 0.9 % injection 10-40 mL, 10-40 mL, Intracatheter, PRN, AOrvan Seen BGlenice Bow MD   sodium chloride flush (NS) 0.9 % injection 3 mL, 3 mL, Intravenous, Q12H, Gold, Wayne E, PA-C, 3 mL at 03/29/21 0943   sodium chloride flush (NS) 0.9 % injection 3 mL, 3 mL, Intravenous, PRN, Gold, Wayne E, PA-C   traMADol (ULTRAM) tablet 50-100 mg, 50-100 mg, Oral, Q12H, WLyndee Leo RPH, 50 mg at 04/01/21 1013  Exam: Current vital signs: BP (!) 99/45   Pulse (!) 102   Temp 98.2 F (36.8 C) (Oral)   Resp (!) 34   Ht '6\' 2"'  (1.88 m)   Wt 78.3 kg   SpO2 93%   BMI 22.16 kg/m  Vital signs in last 24 hours: Temp:  [97.3 F (36.3 C)-99.7 F (37.6 C)] 98.2 F (36.8 C) (06/09 0800) Pulse Rate:  [44-123] 102 (06/09 1630) Resp:  [13-34] 34 (06/09 1630) BP: (63-142)/(40-73) 99/45 (06/09 1630) SpO2:  [82 %-100 %] 93 % (06/09 1630) FiO2 (%):  [90 %-100 %] 90 % (06/09 1532) Weight:  [78.3 kg] 78.3 kg (06/09 0500)  GENERAL: Non-responsive to stimuli requiring airway protection Head: Normocephalic and atraumatic without obvious abnormality EENT: Normal conjunctivae, clear, thin oral secretions via oral suctioning LUNGS: Patient intubated for airway protection prior to full neurologic assessment due to rapid mental status decline, ETT secured midline CV: Regular rate to tachycardic on cardiac monitor, hypotension noted throughout assessment requiring increasing pressor drip rates.  ABDOMEN: Soft, non-distended Ext: warm, without obvious abnormality  NEURO:  Mental Status: Non-responsive to painful stimuli. Unable to assess orientation  due to patient condition.  Unable to assess speech or language due to patient unresponsive state.  Patient intubated for airway protection prior to CT imaging.  Cranial Nerves:  II: Pupils dilated, irregular, ovoid 9 mm, non-reactive to light bilaterally.  III, IV, VI: Eyes midline and fixed V, VII: Corneal reflexes are not intact  VIII: Unable to assess due to patient condition IX, X: Head appears midline. Weak cough and gag reflexes present.  XI, XII: Unable to assess due to patient condition Motor: No movement of extremities. Patient does not withdrawal upper or lower extremities to noxious stimuli.  Tone is flaccid. Bulk is slightly decreased.  Sensation:  Unable to  assess due to patient condition, patient does not withdrawal or grimace with application of noxious stimuli Coordination: Unable to assess due to patient's condition Plantars: Mute bilaterally Gait: deferred  NIHSS: 1a Level of Conscious.: 3 unresponsive to all stimuli 1b LOC Questions: 2 1c LOC Commands: 2 2 Best Gaze: 2; fixed midline 3 Visual: 3;  blink to threat absent throughout 4 Facial Palsy: 0 5a Motor Arm - left: 4; no movement 5b Motor Arm - Right: 4; no movement 6a Motor Leg - Left: 4; no movement 6b Motor Leg - Right: 4; no movement 7 Limb Ataxia: 0 8 Sensory: 0 9 Best Language: 3 10 Dysarthria: 2 11 Extinct. and Inatten.: 0 TOTAL: 33  Labs I have reviewed labs in epic and the results pertinent to this consultation are: CBC    Component Value Date/Time   WBC 28.1 (H) 04/01/2021 1328   RBC 3.32 (L) 04/01/2021 1328   HGB 12.9 (L) 04/01/2021 1526   HCT 38.0 (L) 04/01/2021 1526   PLT 326 04/01/2021 1328   MCV 100.0 04/01/2021 1328   MCH 29.8 04/01/2021 1328   MCHC 29.8 (L) 04/01/2021 1328   RDW 19.9 (H) 04/01/2021 1328   LYMPHSABS 2.4 04/01/2021 1328   MONOABS 1.9 (H) 04/01/2021 1328   EOSABS 0.6 (H) 04/01/2021 1328   BASOSABS 0.1 04/01/2021 1328   CMP     Component Value Date/Time   NA  129 (L) 04/01/2021 1526   K 4.1 04/01/2021 1526   CL 95 (L) 04/01/2021 1328   CO2 21 (L) 04/01/2021 1328   GLUCOSE 341 (H) 04/01/2021 1328   BUN 43 (H) 04/01/2021 1328   CREATININE 4.19 (H) 04/01/2021 1328   CALCIUM 7.4 (L) 04/01/2021 1328   PROT 6.0 (L) 04/01/2021 0325   ALBUMIN 2.5 (L) 04/01/2021 0325   AST 56 (H) 04/01/2021 0325   ALT 28 04/01/2021 0325   ALKPHOS 249 (H) 04/01/2021 0325   BILITOT 1.8 (H) 04/01/2021 0325   GFRNONAA 16 (L) 04/01/2021 1328   Lipid Panel     Component Value Date/Time   CHOL 108 03/13/2021 0147   TRIG 54 03/28/2021 0405   HDL 32 (L) 03/12/2021 0147   CHOLHDL 3.4 03/01/2021 0147   VLDL 10 03/22/2021 0147   LDLCALC 66 02/21/2021 0147   Lab Results  Component Value Date   HGBA1C 6.5 (H) 02/23/2021   Imaging I have reviewed the images obtained:  CT-scan of the brain: 1. No evidence of acute large vascular territory infarct or acute hemorrhage. ASPECTS is 10. 2. Small remote appearing lacunar infarcts in bilateral frontal lobe white matter. An MRI could provide more sensitive evaluation for acute infarct if clinically indicated.  CTA Head: 1. No large vessel occlusion. 2. 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   CTA Neck: 1. Severe atherosclerotic narrowing of the right vertebral artery origin. 2. Bilateral carotid bifurcation atherosclerosis without greater than 50% narrowing. 3. Small internal carotid arteries in the neck, nonspecific but potentially chronic/congenital. 4. Mild diffuse subcutaneous soft tissue swelling, suggestive of anasarca. 5. Please see concurrent CT chest for intrathoracic findings.  Echocardiogram 04/01/2021: 1. Left ventricular ejection fraction, by estimation, is 45 to 50%. The left ventricle has mildly decreased function. The left ventricle demonstrates regional wall motion abnormalities with basal to mid inferolateral severe hypokinesis. There is mild left ventricular hypertrophy. Left  ventricular diastolic parameters are  indeterminate due to atrial flutter.   2. Right ventricular systolic function is moderately reduced. The right ventricular size  is mildly enlarged. Mildly increased right ventricular wall thickness.   3. Left atrial size was moderately dilated.   4. Right atrial size was mildly dilated.   5. The mitral valve is normal in structure. No evidence of mitral valve regurgitation. No evidence of mitral stenosis.   6. The aortic valve is tricuspid. Aortic valve regurgitation is not visualized. Mild aortic valve sclerosis is present, with no evidence of aortic valve stenosis.   7. The inferior vena cava is dilated in size with <50% respiratory variability, suggesting right atrial pressure of 15 mmHg.   EEG 04/01/2021: This study is suggestive of moderate diffuse encephalopathy, nonspecific etiology. No seizures or epileptiform discharges were seen throughout the recording.  Assessment: 55 y.o. male with PMHx of HTN, HLD, CAD, PAD, type 2 DM, heart failure, and ESRD on HD T/Th/Sat who was admitted to the hospital for heart failure optimization and CABG for severe CAD. CABG x 4 complete on 5/31. Today, 6/9 patient with acute neurologic decline and activated as a CODE STROKE. Patient initially called his family stating he felt like "he was going to die" before becoming unresponsive with dilated and non-reactive pupils.  - Examination revealed patient with an NIHSS of 33. Patient did not respond to noxious stimuli, his pupils were acutely dilated and non-reactive with absent corneal reflexes. Patient retained a weak gag reflex and was intubated for airway protection prior to CT imaging.  - CT imaging without evidence of acute vascular territory infarct or hemorrhage with an ASPECTS score of 10. CTA was without large vessel occlusion but did reveal severe atherosclerotic narrowing of the right vertebral artery origin.  - Overall, it is felt that presentation is not consistent  with acute stroke due to lack of hemorrhage on imaging and without evidence of an acute large vessel occlusion. Will obtain MRI for further evaluation but with very low suspicion for stroke etiology of acute alteration in level of consciousness.  - EEG suggests moderate diffuse encephalopathy without seizures or epileptiform discharges. Low suspicion for seizure etiology of altered level of consciousness at this time.  - ON REASSESSMENT 17:00- pupils remain dilated and non-reactive to light, corneal reflexes remain absent but patient slightly moving extremities x 4 and intermittently follows simple commands to wiggle his toes. Per bedside RN, patient woke after removal of sternal wires.  Impression: Acute alteration in level of consciousness; etiology unclear  Recommendations: - MRI brain wo contrast when able  - Stroke prophylaxis: on antiplatelet per cardiology: ASA 81 mg PO daily  - Was on heparin gtt for atrial fibrillation / atrial flutter. Okay to continue from neurology perspective - Management of hypotension per primary team as you are - q2 hr neuro checks - STAT head CT for any decline in neuro exam - Cardiac telemetry - PT/OT/SLP  David Rose, AGAC-NP [facility] Pager: [phone]  Neurology Attending Attestation   I examined the patient and discussed plan with Ms. Toberman NP. Above note has been edited by me to reflect my findings and recommendations. I was present throughout the stroke code and made all significant decisions and personally reviewed CNS imaging and also discussed CTA results with radiologist by phone.    This patient is critically ill and at significant risk of neurological worsening, death and care requires constant monitoring of vital signs, hemodynamics,respiratory and cardiac monitoring, neurological assessment, discussion with family, other specialists and medical decision making of high complexity. I spent 110 minutes of neurocritical  care time  in the care of  this patient. This was time spent independent of any time provided by nurse practitioner or PA.   David Monks, MD [facility] [phone]   If 7pm- 7am, please page neurology on call as listed in [place].

## 2021-04-01 NOTE — Progress Notes (Signed)
Nutrition Follow-up  DOCUMENTATION CODES:   Severe malnutrition in context of chronic illness  INTERVENTION:   Place Cortrak tube tomorrow  TF recommendations:  Vital 1.5 at 60 ml/hr Begin TF at rate of 20 ml/hr; titrate by 10 mL q 8 hours until goal rate of 60 ml/hr Pro-Source TF 45 mL QID Provides 141 g of protein, 2320 kcals, 1094 mL of free water Meets 100% estimated calorie and protein needs  NUTRITION DIAGNOSIS:   Severe Malnutrition related to chronic illness (CAD/CHF/ESRD) as evidenced by severe fat depletion, severe muscle depletion.  Being addressed via nutrition support  GOAL:   Patient will meet greater than or equal to 90% of their needs  Progressing  MONITOR:   Vent status, Skin, TF tolerance, Weight trends, Labs, I & O's  REASON FOR ASSESSMENT:   Consult Enteral/tube feeding initiation and management  ASSESSMENT:   Patient with PMH significant for DM, HTN, PAD, CHF, CAD, ESRD, cirrhosis, and IBS. Presents this admission for CABG.  5/22- s/p RHC, severe 3vd, high cardiac output 5/26- paracentesis- drained 4L 5/31- s/p CABG 6/01- code, hemorrhagic shock, back to OR for mediastinal exploration, Cortrak placed 6/02- extubated, trickle TF initiated 6/03- TF advanced to goal 6/05- Cortrak pulled per provider  6/07- CRRT discontinued, femoral HD cath removed  RD visited patient around lunch time today. At that time, pt had eaten nothing from his lunch tray or breakfast tray but was already on his [employer], taking Pro-Source. At that time, a variety of options to maximize patient's po intake were discussed including downgrading diet to Dysphagia 3 (pt only eats soft foods), adding favorite foods to meal trays, etc. At that time, pt was also in agreement to Cortrak placement with plan for supplemental TF  Noted change in patient status later prior to [facility], pt became unresponsive requiring intubation, CODE Stroke initiated.  Labs: sodium 129 (L),  CBGs 275-298, potassium wdl, Creatinine 4.19 Meds: senpar, aranesp, ss  novolog, lantus, rena-vite   Diet Order:   Diet Order             Diet NPO time specified  Diet effective now                   EDUCATION NEEDS:   Not appropriate for education at this time  Skin:  Skin Assessment: Skin Integrity Issues: Skin Integrity Issues:: Incisions, DTI DTI: sacrum Incisions: R leg, chest  Last BM:  6/9  Height:   Ht Readings from Last 1 Encounters:  03/02/2021 '6\' 2"'$  (1.88 m)    Weight:   Wt Readings from Last 1 Encounters:  04/01/21 78.3 kg     BMI:  Body mass index is 22.16 kg/m.  Estimated Nutritional Needs:   Kcal:  2200-2500 kcals  Protein:  125-155 grams  Fluid:  >/= 2 L/day    Kerman Passey MS, RDN, LDN, CNSC Registered Dietitian III Clinical Nutrition RD Pager and On-Call Pager Number Located in [place]

## 2021-04-01 NOTE — Progress Notes (Signed)
I responded to call that Mr. Miyashiro was totally unresponsive. He had called his wife 2 hours ago and asked her to come to the hospital, but had not called out to his nurse. When I arrived at bedside VS were normal but his eyes were partially opened with very dilated pupils. He was not responsive to threat and pupils were unresponsive to light. We began to BVM and prepped for intubation. BP started to drop and norepi was titrated up. Due to mild gag with aspiration during first intubation attempt, RSI meds were required to intubate-- fentanyl 124mg and rocuronium '100mg'$  given. ETT secured, color change with CO2 detector, and he was taken emergently for head CTs with neurology. Family updated at bedside. Drs. MAundra Dubinand AWatertownupdated.  LJulian Hy DO 04/01/21 2:15 PM [facility]

## 2021-04-01 NOTE — Progress Notes (Signed)
Spoke with Misha,RN and she is aware of the discontinue central line order and states will remove after dialysis.   Delilah Shan Srishti Strnad,RN-VAST

## 2021-04-01 NOTE — Progress Notes (Signed)
Patient ID: David Rawling., male   DOB: Nov 18, 1965, 55 y.o.   MRN: [id number] S: No events overnight. O:BP (!) 114/51   Pulse 93   Temp 98.2 F (36.8 C) (Oral)   Resp (!) 21   Ht '6\' 2"'$  (1.88 m)   Wt 78.3 kg   SpO2 95%   BMI 22.16 kg/m   Intake/Output Summary (Last 24 hours) at 04/01/2021 1101 Last data filed at 04/01/2021 1000 Gross per 24 hour  Intake 1277.59 ml  Output 100 ml  Net 1177.59 ml   Intake/Output: I/O last 3 completed shifts: In: 2245.6 [P.O.:960; I.V.:1165.6; IV Piggyback:100] Out: 120 [Chest Tube:120]  Intake/Output this shift:  Total I/O In: 135.7 [I.V.:135.7] Out: -  Weight change: 2.5 kg Gen: frail, fatigued but NAD CVS: RRR Resp: CTA Abd: +BS, soft, NT/Nd Ext: trace pedal edema, RUE AVF +T/B  Recent Labs  Lab 03/26/21 0442 03/26/21 1632 03/27/21 0509 03/27/21 1614 03/28/21 0405 03/28/21 1620 03/29/21 0325 03/29/21 1533 03/30/21 0414 03/31/21 0356 04/01/21 0325  NA 134*   < > 135   < > 134* 132* 134* 133* 133* 132* 129*  K 4.7   < > 4.6   < > 4.7 5.2* 5.0 4.8 4.6 4.4 4.6  CL 101   < > 100   < > 103 99 101 99 102 99 95*  CO2 26   < > 25   < > '26 26 25 23 25 22 22  '$ GLUCOSE 162*   < > 153*   < > 165* 174* 132* 209* 117* 170* 175*  BUN 11   < > 14   < > '17 18 17 18 20 '$ 29* 37*  CREATININE 1.74*   < > 1.74*   < > 1.60* 1.48* 1.53* 1.61* 1.73* 2.63* 3.56*  ALBUMIN 3.2*   < > 3.0*   < > 2.6* 3.0* 2.9* 2.8* 2.6* 2.5* 2.5*  CALCIUM 8.0*   < > 8.0*   < > 7.7* 7.4* 7.7* 7.7* 7.8* 7.4* 7.2*  PHOS 3.5   < >  --    < > 1.9* 2.8 2.2* 5.4* 2.6 3.0 4.2  AST 22  --  26  --  31  --  38  --  40 88* 56*  ALT 8  --  6  --  10  --  13  --  '15 30 28   '$ < > = values in this interval not displayed.   Liver Function Tests: Recent Labs  Lab 03/30/21 0414 03/31/21 0356 04/01/21 0325  AST 40 88* 56*  ALT '15 30 28  '$ ALKPHOS 177* 259* 249*  BILITOT 2.8* 2.4* 1.8*  PROT 5.9* 5.8* 6.0*  ALBUMIN 2.6* 2.5* 2.5*   No results for input(s): LIPASE, AMYLASE in the last  168 hours. No results for input(s): AMMONIA in the last 168 hours. CBC: Recent Labs  Lab 03/28/21 1620 03/29/21 0121 03/29/21 0325 03/30/21 0414 03/31/21 0356 04/01/21 0325  WBC 18.9*  --  17.5* 13.7* 19.6* 25.9*  HGB 9.2*  --  9.4* 9.8* 9.9* 10.4*  HCT 29.1*  --  30.2* 31.1* 31.7* 33.6*  MCV 96.7  --  97.4 96.0 96.6 98.0  PLT 70*  71*   < > 91*  92* 151 204 300   < > = values in this interval not displayed.   Cardiac Enzymes: No results for input(s): CKTOTAL, CKMB, CKMBINDEX, TROPONINI in the last 168 hours. CBG: Recent Labs  Lab 03/31/21 1606 03/31/21 1946 03/31/21  2349 04/01/21 0326 04/01/21 0700  GLUCAP 186* 227* 125* 159* 172*    Iron Studies: No results for input(s): IRON, TIBC, TRANSFERRIN, FERRITIN in the last 72 hours. Studies/Results: DG Chest Port 1 View  Result Date: 04/01/2021 CLINICAL DATA:  Chest tube.  Open-heart surgery. EXAM: PORTABLE CHEST 1 VIEW COMPARISON:  03/31/2021. FINDINGS: Right IJ sheath in stable position. Right chest tube appears to be in stable position. Lower portion of the tube not imaged. Prior CABG. Cardiomegaly. Progressive diffuse bilateral pulmonary infiltrates/edema. Progressive small bilateral pleural effusions. No pneumothorax. IMPRESSION: 1. Right IJ sheath in stable position. Right chest tube appears to be in stable position. Lower portion of the tube not imaged. No pneumothorax. 2. Prior CABG. Cardiomegaly. Progressive diffuse bilateral pulmonary infiltrates/edema. Progressive small bilateral pleural effusions. Findings consistent with CHF. Electronically Signed   By: Marcello Moores  Register   On: 04/01/2021 08:02   DG Chest Port 1 View  Result Date: 03/31/2021 CLINICAL DATA:  Chest tube.  Open-heart surgery. EXAM: PORTABLE CHEST 1 VIEW COMPARISON:  03/30/2021. FINDINGS: Interval removal of mediastinal drainage catheter and left chest tube. Right chest tube in stable position. Right IJ sheath in stable position. Prior CABG. Stable  cardiomegaly. Progressive bilateral pulmonary infiltrates/edema. Small bilateral pleural effusions. No pneumothorax. IMPRESSION: 1. Interim removal of venous dental drainage catheter left chest tube. Right chest tube in stable position. No pneumothorax. 2. Prior CABG. Cardiomegaly. Progressive bilateral pulmonary infiltrates/edema. Small bilateral pleural effusions. Findings suggest CHF. Electronically Signed   By: Marcello Moores  Register   On: 03/31/2021 07:01    (feeding supplement) PROSource Plus  30 mL Oral TID BM   amiodarone  200 mg Oral BID   aspirin EC  81 mg Oral Daily   atorvastatin  40 mg Oral Daily   bisacodyl  10 mg Oral Daily   Or   bisacodyl  10 mg Rectal Daily   Chlorhexidine Gluconate Cloth  6 each Topical Daily   cinacalcet  30 mg Oral Q T,Th,Sa-HD   darbepoetin (ARANESP) injection - NON-DIALYSIS  60 mcg Subcutaneous Q Thu-1800   docusate sodium  200 mg Oral Daily   feeding supplement  1 Container Oral TID WC   feeding supplement  237 mL Oral TID BM   insulin aspart  0-24 Units Subcutaneous Q4H   insulin glargine  10 Units Subcutaneous Daily   mouth rinse  15 mL Mouth Rinse BID   midodrine  15 mg Oral TID WC   multivitamin  1 tablet Oral QHS   pantoprazole  40 mg Oral Daily   polyethylene glycol  17 g Oral Daily   sildenafil  20 mg Oral TID   sodium chloride flush  10 mL Intracatheter Q8H   sodium chloride flush  10-40 mL Intracatheter Q12H   sodium chloride flush  3 mL Intravenous Q12H   traMADol  50-100 mg Oral Q12H    BMET    Component Value Date/Time   NA 129 (L) 04/01/2021 0325   K 4.6 04/01/2021 0325   CL 95 (L) 04/01/2021 0325   CO2 22 04/01/2021 0325   GLUCOSE 175 (H) 04/01/2021 0325   BUN 37 (H) 04/01/2021 0325   CREATININE 3.56 (H) 04/01/2021 0325   CALCIUM 7.2 (L) 04/01/2021 0325   GFRNONAA 19 (L) 04/01/2021 0325   CBC    Component Value Date/Time   WBC 25.9 (H) 04/01/2021 0325   RBC 3.43 (L) 04/01/2021 0325   HGB 10.4 (L) 04/01/2021 0325   HCT  33.6 (L) 04/01/2021 0325  PLT 300 04/01/2021 0325   MCV 98.0 04/01/2021 0325   MCH 30.3 04/01/2021 0325   MCHC 31.0 04/01/2021 0325   RDW 19.8 (H) 04/01/2021 0325   LYMPHSABS 1.5 03/13/2021 1545   MONOABS 0.7 03/22/2021 1545   EOSABS 0.6 (H) 03/05/2021 1545   BASOSABS 0.1 03/19/2021 1545    Outpatient dialysis prescription: [facility] TTS [id number] 4 Hours, 180 dialyzer, 2K/2.5 Ca, DFR 500, BFR 400 Use AVF; graft on inner arm, 15 guage Heparin loading dose 2000 and hourly dose of 1200 EDW 78.5 kg Last post weight 78 kg on 5/21 (went in at 78 and came out at 78 kg) Meds: epogen 2400 units each tx venofer 50 mg weekly Not on hectorol or calcitriol sensipar 30 mg three times a week with HD   Assessment/Plan:   ESRD - normally on [place] dialysis in [place], last HD 5/30, now on CRRT since 04/14/2021 following CABG complicated by PEA arrest and need for pressors.  Still remains on pressors however he is feeling better.   CRRT stopped 03/30/21 Plan for IHD today to keep on his outpatient schedule   femoral HD catheter removed 03/30/21. CAD s/p CABG x 4 complicated by PEA arrest.  Currently stable Acute on chronic CHF with cardiogenic shock post CABG - currently on levophed, milrinone, and started on midodrine.  Ok to increase midodrine to 15 mg tid and wean levophed as tolerated. For repeat ECHO due to persistent hypotension Pulmonary HTN - per HF team Cirrhosis - presumably cardiogenic.  S/p paracentesis on 03/18/21. 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  Hypophosphatemia - repleted with IV phos and follow.   Donetta Potts, MD [employer] [phone]

## 2021-04-01 NOTE — Progress Notes (Signed)
ANTICOAGULATION CONSULT NOTE  Pharmacy Consult for heparin Indication: atrial flutter  Allergies  Allergen Reactions   Shellfish-Derived Products Anaphylaxis   Wasp Venom Protein Anaphylaxis   Penicillins Other (See Comments)    Childhood reaction    Patient Measurements: Height: '6\' 2"'$  (188 cm) Weight: 78.3 kg (172 lb 9.9 oz) IBW/kg (Calculated) : 82.2 Heparin Dosing Weight: 79kg  Vital Signs: Temp: 98.2 F (36.8 C) (06/09 0800) Temp Source: Oral (06/09 0800) BP: 96/43 (06/09 0800) Pulse Rate: 98 (06/09 0800)  Labs: Recent Labs    03/30/21 0414 03/31/21 0214 03/31/21 0356 03/31/21 0951 03/31/21 2155 04/01/21 0325 04/01/21 0448  HGB 9.8*  --  9.9*  --   --  10.4*  --   HCT 31.1*  --  31.7*  --   --  33.6*  --   PLT 151  --  204  --   --  300  --   LABPROT 17.3*  --  17.9*  --   --   --   --   INR 1.4*  --  1.5*  --   --   --   --   HEPARINUNFRC  --    < >  --    < > <0.10* 0.22* 0.11*  CREATININE 1.73*  --  2.63*  --   --  3.56*  --    < > = values in this interval not displayed.     Estimated Creatinine Clearance: 26 mL/min (A) (by C-G formula based on SCr of 3.56 mg/dL (H)).   Medical History: Past Medical History:  Diagnosis Date   ESRD (end stage renal disease) ([place])    Hypertension    Peripheral arterial disease ([place])    Type 2 diabetes mellitus (HCC)    Assessment: 55 year old male with recent CABG now with postop afib/flutter. There was concern several days ago immediately post op for HIT as plt count dropped to 30s. Antibody was ordered and shows sent, actual lab/blood draw never actually sent out/received by lab although shows in process. Discussed with CCM/HF and ok with low dose heparin without bolusing and will disregard HIT antibody in process.  Heparin level low this morning (0.18) after restarting yesterday. No bleed noted on CT yesterday. Hemoglobin stable. No overt bleeding noted this am on rounds.   Goal of Therapy:  Heparin level  0.3-0.5  units/ml  Monitor platelets by anticoagulation protocol: Yes   Plan:  Increase heparin to 2050 units/hr Check anti-Xa level in 8 hours and daily while on heparin Continue to monitor H&H and platelets  Erin Hearing PharmD., BCPS Clinical Pharmacist 04/01/2021 8:37 AM

## 2021-04-01 NOTE — Progress Notes (Signed)
Critical ABG results given to Dr. Carlis Abbott. MD to make changes. RT will continue to monitor and be available as needed.

## 2021-04-01 NOTE — Progress Notes (Signed)
Physical Therapy Treatment Patient Details Name: David Rose. MRN: [id number] DOB: 1966-07-29 Today's Date: 04/01/2021    History of Present Illness 55 yo admitted 5/23 for heart failure optimization prior to CABGx 4 performed on 5/31. 5/26 paracentesis. Pt extubated 6/1 but later had PEA arrest with intubation and return to OR for exploration with chest tube placed and extubated 6/2. CRRT started 6/1, ended 6/7. PMhx: CAD, ESRD, pulmonary HTN, CHF, anemia, cirrhosis, DM, finger amputation    PT Comments    Pt in chair on arrival and reports fatigue with desire to return to bed. BP in chair on arrival 96/43 (56) with HR 98 and after transition back to bed 80/66 (72). Pt educated for all precautions, HEP and progressive mobility with continued need for therapy as medical status allows. Increased time for positioning in bed.   SPo2 100% on 1L   Follow Up Recommendations  CIR     Equipment Recommendations  Rolling walker with 5" wheels    Recommendations for Other Services       Precautions / Restrictions Precautions Precautions: Fall;Sternal Precaution Comments: watch BP    Mobility  Bed Mobility   Bed Mobility: Sit to Supine       Sit to supine: Mod assist;+2 for physical assistance   General bed mobility comments: pt in chair on arrival. Return to supine with mod +2 with assist to lift legs to surface, control trunk and position in midline    Transfers Overall transfer level: Needs assistance   Transfers: Sit to/from Stand;Stand Pivot Transfers Sit to Stand: Mod assist;+2 safety/equipment;+2 physical assistance Stand pivot transfers: Mod assist;+2 physical assistance       General transfer comment: cues for hand placement with assist to rise from surface with RW present and pt able to pivot chair to bed with assist. Limited to pivot transfers due to low BP and fatigue  Ambulation/Gait             General Gait Details: unable today due to low BP's and  fatigue   Stairs             Wheelchair Mobility    Modified Rankin (Stroke Patients Only)       Balance Overall balance assessment: Needs assistance   Sitting balance-Leahy Scale: Fair     Standing balance support: Bilateral upper extremity supported Standing balance-Leahy Scale: Poor Standing balance comment: bil Ue support on RW in standing                            Cognition Arousal/Alertness: Awake/alert Behavior During Therapy: Flat affect Overall Cognitive Status: Impaired/Different from baseline Area of Impairment: Awareness;Problem solving;Attention;Memory                   Current Attention Level: Sustained Memory: Decreased recall of precautions       Problem Solving: Slow processing;Decreased initiation;Difficulty sequencing;Requires verbal cues General Comments: pt with slow processing with hand and chin soaked from ice on arrival. Pt with decreased recall of precautions and only able to state 2/4 with education for all      Exercises General Exercises - Lower Extremity Long Arc Quad: AROM;Both;Seated;10 reps Hip Flexion/Marching: AROM;Both;10 reps;Seated    General Comments        Pertinent Vitals/Pain Pain Score: 5  Pain Location: incisional Pain Descriptors / Indicators: Guarding;Aching Pain Intervention(s): Limited activity within patient's tolerance;Monitored during session;Repositioned    Home Living  Prior Function            PT Goals (current goals can now be found in the care plan section) Progress towards PT goals: Progressing toward goals    Frequency    Min 3X/week      PT Plan Current plan remains appropriate    Co-evaluation              AM-PAC PT "6 Clicks" Mobility   Outcome Measure    Help needed moving from lying on your back to sitting on the side of a flat bed without using bedrails?: A Lot Help needed moving to and from a bed to a chair  (including a wheelchair)?: A Lot Help needed standing up from a chair using your arms (e.g., wheelchair or bedside chair)?: Total Help needed to walk in hospital room?: Total Help needed climbing 3-5 steps with a railing? : Total 6 Click Score: 7    End of Session Equipment Utilized During Treatment: Gait belt;Oxygen Activity Tolerance: Patient limited by fatigue Patient left: in bed;with call bell/phone within reach;with bed alarm set Nurse Communication: Mobility status;Precautions PT Visit Diagnosis: Other abnormalities of gait and mobility (R26.89);Muscle weakness (generalized) (M62.81);Difficulty in walking, not elsewhere classified (R26.2)     Time: [id number] PT Time Calculation (min) (ACUTE ONLY): 19 min  Charges:  $Therapeutic Activity: 8-22 mins                     David Rose P, PT Acute Rehabilitation Services Pager: [phone] Office: [place] 04/01/2021, 11:37 AM

## 2021-04-01 NOTE — Progress Notes (Signed)
Stat  EEG complete - results pending.  

## 2021-04-01 NOTE — Procedures (Signed)
Patient Name: Martez Wurzer.  MRN: [id number]  Epilepsy Attending: Lora Havens  Referring Physician/Provider: Dr Noemi Chapel Date: 04/01/2021 Duration: 27.16 mins   Patient history: 55yo M s/p cardiac arrest, ws improving but had an episode of sudden onset unresponsiveness. EEG to evaluate for seizure   Level of alertness: awake   AEDs during EEG study: None   Technical aspects: This EEG study was done with scalp electrodes positioned according to the 10-20 International system of electrode placement. Electrical activity was acquired at a sampling rate of '500Hz'$  and reviewed with a high frequency filter of '70Hz'$  and a low frequency filter of '1Hz'$ . EEG data were recorded continuously and digitally stored.   Description: EEG showed continuous generalized 5 to 6 Hz theta as well as intermittent 2-'3Hz'$  delta slowing. Hyperventilation and photic stimulation were not performed.       ABNORMALITY - Continuous slow, generalized   IMPRESSION: This study is suggestive of moderate diffuse encephalopathy, nonspecific etiology. No seizures or epileptiform discharges were seen throughout the recording.      Anda Sobotta Barbra Sarks

## 2021-04-02 ENCOUNTER — Inpatient Hospital Stay: Payer: Self-pay

## 2021-04-02 ENCOUNTER — Inpatient Hospital Stay (HOSPITAL_COMMUNITY): Payer: Medicare Other

## 2021-04-02 DIAGNOSIS — N189 Chronic kidney disease, unspecified: Secondary | ICD-10-CM

## 2021-04-02 DIAGNOSIS — A419 Sepsis, unspecified organism: Secondary | ICD-10-CM

## 2021-04-02 DIAGNOSIS — R6521 Severe sepsis with septic shock: Secondary | ICD-10-CM

## 2021-04-02 DIAGNOSIS — D72829 Elevated white blood cell count, unspecified: Secondary | ICD-10-CM

## 2021-04-02 LAB — CBC
HCT: 30.3 % — ABNORMAL LOW (ref 39.0–52.0)
HCT: 28.9 % — ABNORMAL LOW (ref 39.0–52.0)
HCT: 29.3 % — ABNORMAL LOW (ref 39.0–52.0)
Hemoglobin: 9.8 g/dL — ABNORMAL LOW (ref 13.0–17.0)
Hemoglobin: 9.5 g/dL — ABNORMAL LOW (ref 13.0–17.0)
Hemoglobin: 9.8 g/dL — ABNORMAL LOW (ref 13.0–17.0)
MCH: 30.8 pg (ref 26.0–34.0)
MCH: 30.6 pg (ref 26.0–34.0)
MCH: 30.7 pg (ref 26.0–34.0)
MCHC: 32.3 g/dL (ref 30.0–36.0)
MCHC: 32.9 g/dL (ref 30.0–36.0)
MCHC: 33.4 g/dL (ref 30.0–36.0)
MCV: 95.3 fL (ref 80.0–100.0)
MCV: 93.2 fL (ref 80.0–100.0)
MCV: 91.8 fL (ref 80.0–100.0)
Platelets: 272 10*3/uL (ref 150–400)
Platelets: 281 10*3/uL (ref 150–400)
Platelets: 286 10*3/uL (ref 150–400)
RBC: 3.18 MIL/uL — ABNORMAL LOW (ref 4.22–5.81)
RBC: 3.1 MIL/uL — ABNORMAL LOW (ref 4.22–5.81)
RBC: 3.19 MIL/uL — ABNORMAL LOW (ref 4.22–5.81)
RDW: 20 % — ABNORMAL HIGH (ref 11.5–15.5)
RDW: 20 % — ABNORMAL HIGH (ref 11.5–15.5)
RDW: 20.2 % — ABNORMAL HIGH (ref 11.5–15.5)
WBC: 87.8 10*3/uL (ref 4.0–10.5)
WBC: 86.9 10*3/uL (ref 4.0–10.5)
WBC: 88.4 10*3/uL (ref 4.0–10.5)
nRBC: 0.1 % (ref 0.0–0.2)
nRBC: 0 % (ref 0.0–0.2)
nRBC: 0 % (ref 0.0–0.2)

## 2021-04-02 LAB — COMPREHENSIVE METABOLIC PANEL
ALT: 23 U/L (ref 0–44)
AST: 44 U/L — ABNORMAL HIGH (ref 15–41)
Albumin: 2.2 g/dL — ABNORMAL LOW (ref 3.5–5.0)
Alkaline Phosphatase: 205 U/L — ABNORMAL HIGH (ref 38–126)
Anion gap: 16 — ABNORMAL HIGH (ref 5–15)
BUN: 50 mg/dL — ABNORMAL HIGH (ref 6–20)
CO2: 16 mmol/L — ABNORMAL LOW (ref 22–32)
Calcium: 7.4 mg/dL — ABNORMAL LOW (ref 8.9–10.3)
Chloride: 97 mmol/L — ABNORMAL LOW (ref 98–111)
Creatinine, Ser: 4.6 mg/dL — ABNORMAL HIGH (ref 0.61–1.24)
GFR, Estimated: 14 mL/min — ABNORMAL LOW (ref >60)
Glucose, Bld: 102 mg/dL — ABNORMAL HIGH (ref 70–99)
Potassium: 4.3 mmol/L (ref 3.5–5.1)
Sodium: 129 mmol/L — ABNORMAL LOW (ref 135–145)
Total Bilirubin: 1.8 mg/dL — ABNORMAL HIGH (ref 0.3–1.2)
Total Protein: 5.6 g/dL — ABNORMAL LOW (ref 6.5–8.1)

## 2021-04-02 LAB — URINALYSIS, ROUTINE W REFLEX MICROSCOPIC
Bilirubin Urine: NEGATIVE
Glucose, UA: 150 mg/dL — AB
Ketones, ur: NEGATIVE mg/dL
Nitrite: NEGATIVE
Protein, ur: 100 mg/dL — AB
RBC / HPF: >50 RBC/hpf — ABNORMAL HIGH (ref 0–5)
Specific Gravity, Urine: 1.023 (ref 1.005–1.030)
Squamous Epithelial / HPF: >50 — ABNORMAL HIGH (ref 0–5)
WBC, UA: >50 WBC/hpf — ABNORMAL HIGH (ref 0–5)
pH: 5 (ref 5.0–8.0)

## 2021-04-02 LAB — GLUCOSE, CAPILLARY
Glucose-Capillary: 104 mg/dL — ABNORMAL HIGH (ref 70–99)
Glucose-Capillary: 110 mg/dL — ABNORMAL HIGH (ref 70–99)
Glucose-Capillary: 117 mg/dL — ABNORMAL HIGH (ref 70–99)
Glucose-Capillary: 120 mg/dL — ABNORMAL HIGH (ref 70–99)
Glucose-Capillary: 123 mg/dL — ABNORMAL HIGH (ref 70–99)
Glucose-Capillary: 72 mg/dL (ref 70–99)
Glucose-Capillary: 85 mg/dL (ref 70–99)

## 2021-04-02 LAB — POCT I-STAT 7, (LYTES, BLD GAS, ICA,H+H)
Acid-base deficit: 10 mmol/L — ABNORMAL HIGH (ref 0.0–2.0)
Bicarbonate: 16.9 mmol/L — ABNORMAL LOW (ref 20.0–28.0)
Calcium, Ion: 1.05 mmol/L — ABNORMAL LOW (ref 1.15–1.40)
HCT: 36 % — ABNORMAL LOW (ref 39.0–52.0)
Hemoglobin: 12.2 g/dL — ABNORMAL LOW (ref 13.0–17.0)
O2 Saturation: 96 %
Patient temperature: 100.8
Potassium: 4.3 mmol/L (ref 3.5–5.1)
Sodium: 130 mmol/L — ABNORMAL LOW (ref 135–145)
TCO2: 18 mmol/L — ABNORMAL LOW (ref 22–32)
pCO2 arterial: 40.3 mmHg (ref 32.0–48.0)
pH, Arterial: 7.237 — ABNORMAL LOW (ref 7.350–7.450)
pO2, Arterial: 103 mmHg (ref 83.0–108.0)

## 2021-04-02 LAB — BASIC METABOLIC PANEL
Anion gap: 13 (ref 5–15)
BUN: 42 mg/dL — ABNORMAL HIGH (ref 6–20)
CO2: 21 mmol/L — ABNORMAL LOW (ref 22–32)
Calcium: 7.2 mg/dL — ABNORMAL LOW (ref 8.9–10.3)
Chloride: 98 mmol/L (ref 98–111)
Creatinine, Ser: 3.77 mg/dL — ABNORMAL HIGH (ref 0.61–1.24)
GFR, Estimated: 18 mL/min — ABNORMAL LOW (ref >60)
Glucose, Bld: 132 mg/dL — ABNORMAL HIGH (ref 70–99)
Potassium: 4.5 mmol/L (ref 3.5–5.1)
Sodium: 132 mmol/L — ABNORMAL LOW (ref 135–145)

## 2021-04-02 LAB — RENAL FUNCTION PANEL
Albumin: 2.1 g/dL — ABNORMAL LOW (ref 3.5–5.0)
Anion gap: 16 — ABNORMAL HIGH (ref 5–15)
BUN: 45 mg/dL — ABNORMAL HIGH (ref 6–20)
CO2: 19 mmol/L — ABNORMAL LOW (ref 22–32)
Calcium: 7 mg/dL — ABNORMAL LOW (ref 8.9–10.3)
Chloride: 98 mmol/L (ref 98–111)
Creatinine, Ser: 4.17 mg/dL — ABNORMAL HIGH (ref 0.61–1.24)
GFR, Estimated: 16 mL/min — ABNORMAL LOW (ref >60)
Glucose, Bld: 125 mg/dL — ABNORMAL HIGH (ref 70–99)
Phosphorus: 3.6 mg/dL (ref 2.5–4.6)
Potassium: 4.4 mmol/L (ref 3.5–5.1)
Sodium: 133 mmol/L — ABNORMAL LOW (ref 135–145)

## 2021-04-02 LAB — LACTATE DEHYDROGENASE: LDH: 232 U/L — ABNORMAL HIGH (ref 98–192)

## 2021-04-02 LAB — MAGNESIUM
Magnesium: 2.1 mg/dL (ref 1.7–2.4)
Magnesium: 2.2 mg/dL (ref 1.7–2.4)
Magnesium: 2.2 mg/dL (ref 1.7–2.4)

## 2021-04-02 LAB — LACTIC ACID, PLASMA
Lactic Acid, Venous: 2.8 mmol/L (ref 0.5–1.9)
Lactic Acid, Venous: 3.2 mmol/L (ref 0.5–1.9)
Lactic Acid, Venous: 2.7 mmol/L (ref 0.5–1.9)

## 2021-04-02 LAB — COOXEMETRY PANEL
Carboxyhemoglobin: 1.3 % (ref 0.5–1.5)
Methemoglobin: 1.5 % (ref 0.0–1.5)
O2 Saturation: 88 %
Total hemoglobin: 9.7 g/dL — ABNORMAL LOW (ref 12.0–16.0)

## 2021-04-02 LAB — HEPARIN LEVEL (UNFRACTIONATED)
Heparin Unfractionated: 0.18 IU/mL — ABNORMAL LOW (ref 0.30–0.70)
Heparin Unfractionated: >1.1 IU/mL — ABNORMAL HIGH (ref 0.30–0.70)

## 2021-04-02 LAB — PHOSPHORUS
Phosphorus: 3.3 mg/dL (ref 2.5–4.6)
Phosphorus: 3.8 mg/dL (ref 2.5–4.6)

## 2021-04-02 MED ORDER — AMIODARONE HCL IN DEXTROSE 360-4.14 MG/200ML-% IV SOLN
30.0000 mg/h | INTRAVENOUS | Status: DC
  Administered 2021-04-02 – 2021-04-05 (×7): 30 mg/h via INTRAVENOUS
  Filled 2021-04-02 (×6): qty 200

## 2021-04-02 MED ORDER — PHENYLEPHRINE CONCENTRATED 100MG/250ML (0.4 MG/ML) INFUSION SIMPLE
0.0000 ug/min | INTRAVENOUS | Status: DC
  Administered 2021-04-02: 200 ug/min via INTRAVENOUS
  Filled 2021-04-02: qty 250

## 2021-04-02 MED ORDER — PHENYLEPHRINE HCL-NACL 20-0.9 MG/250ML-% IV SOLN
0.0000 ug/min | INTRAVENOUS | Status: DC
  Administered 2021-04-02: 10 ug/min via INTRAVENOUS
  Filled 2021-04-02: qty 250

## 2021-04-02 MED ORDER — PRISMASOL BGK 4/2.5 32-4-2.5 MEQ/L REPLACEMENT SOLN: Status: DC

## 2021-04-02 MED ORDER — SODIUM CHLORIDE 0.9 % IV SOLN
200.0000 mg | INTRAVENOUS | Status: DC
  Filled 2021-04-02: qty 200

## 2021-04-02 MED ORDER — ASPIRIN 81 MG PO CHEW
81.0000 mg | CHEWABLE_TABLET | Freq: Every day | ORAL | Status: DC
  Administered 2021-04-02 – 2021-04-14 (×13): 81 mg
  Filled 2021-04-02 (×13): qty 1

## 2021-04-02 MED ORDER — ATORVASTATIN CALCIUM 40 MG PO TABS
40.0000 mg | ORAL_TABLET | Freq: Every day | ORAL | Status: DC
  Administered 2021-04-02 – 2021-04-14 (×13): 40 mg
  Filled 2021-04-02 (×14): qty 1

## 2021-04-02 MED ORDER — POLYETHYLENE GLYCOL 3350 17 G PO PACK
17.0000 g | PACK | Freq: Every day | ORAL | Status: DC
  Administered 2021-04-03 – 2021-04-13 (×11): 17 g
  Filled 2021-04-02 (×10): qty 1

## 2021-04-02 MED ORDER — PHENYLEPHRINE CONCENTRATED 100MG/250ML (0.4 MG/ML) INFUSION SIMPLE: 0.0000 ug/min | INTRAVENOUS | Status: DC

## 2021-04-02 MED ORDER — HEPARIN SODIUM (PORCINE) 1000 UNIT/ML DIALYSIS
1000.0000 [IU] | INTRAMUSCULAR | Status: DC | PRN
  Administered 2021-04-14: 1000 [IU] via INTRAVENOUS_CENTRAL
  Filled 2021-04-02 (×2): qty 6
  Filled 2021-04-02: qty 3

## 2021-04-02 MED ORDER — SODIUM CHLORIDE 0.9 % IV SOLN: INTRAVENOUS | Status: DC

## 2021-04-02 MED ORDER — PANTOPRAZOLE SODIUM 40 MG PO PACK
40.0000 mg | PACK | Freq: Every day | ORAL | Status: DC
  Administered 2021-04-02 – 2021-04-12 (×11): 40 mg
  Filled 2021-04-02 (×11): qty 20

## 2021-04-02 MED ORDER — HEPARIN (PORCINE) 25000 UT/250ML-% IV SOLN
2950.0000 [IU]/h | INTRAVENOUS | Status: DC
  Administered 2021-04-02: 1950 [IU]/h via INTRAVENOUS
  Administered 2021-04-03: 2050 [IU]/h via INTRAVENOUS
  Administered 2021-04-03: 1950 [IU]/h via INTRAVENOUS
  Administered 2021-04-04: 2200 [IU]/h via INTRAVENOUS
  Administered 2021-04-04: 2300 [IU]/h via INTRAVENOUS
  Administered 2021-04-05: 2500 [IU]/h via INTRAVENOUS
  Administered 2021-04-05 – 2021-04-06 (×4): 2600 [IU]/h via INTRAVENOUS
  Administered 2021-04-07: 2800 [IU]/h via INTRAVENOUS
  Administered 2021-04-07: 2600 [IU]/h via INTRAVENOUS
  Administered 2021-04-08: 2800 [IU]/h via INTRAVENOUS
  Filled 2021-04-02 (×12): qty 250

## 2021-04-02 MED ORDER — DOCUSATE SODIUM 50 MG/5ML PO LIQD
200.0000 mg | Freq: Every day | ORAL | Status: DC
  Administered 2021-04-02 – 2021-04-07 (×6): 200 mg
  Filled 2021-04-02 (×6): qty 20

## 2021-04-02 MED ORDER — PHENYLEPHRINE HCL-NACL 10-0.9 MG/250ML-% IV SOLN
0.0000 ug/min | INTRAVENOUS | Status: DC
  Administered 2021-04-02: 20 ug/min via INTRAVENOUS
  Administered 2021-04-02: 40 ug/min via INTRAVENOUS
  Filled 2021-04-02 (×2): qty 250

## 2021-04-02 MED ORDER — SODIUM BICARBONATE 8.4 % IV SOLN
100.0000 meq | Freq: Once | INTRAVENOUS | Status: AC
  Administered 2021-04-02: 100 meq via INTRAVENOUS
  Filled 2021-04-02: qty 100

## 2021-04-02 MED ORDER — PROSOURCE TF PO LIQD
45.0000 mL | Freq: Four times a day (QID) | ORAL | Status: DC
  Administered 2021-04-02 – 2021-04-14 (×44): 45 mL
  Filled 2021-04-02 (×44): qty 45

## 2021-04-02 MED ORDER — RENA-VITE PO TABS
1.0000 | ORAL_TABLET | Freq: Every day | ORAL | Status: DC
  Administered 2021-04-02 – 2021-04-13 (×11): 1
  Filled 2021-04-02 (×11): qty 1

## 2021-04-02 MED ORDER — SODIUM CHLORIDE 0.9 % FOR CRRT: INTRAVENOUS_CENTRAL | Status: DC | PRN

## 2021-04-02 MED ORDER — FENTANYL 2500MCG IN NS 250ML (10MCG/ML) PREMIX INFUSION
0.0000 ug/h | INTRAVENOUS | Status: DC
Start: 2021-04-02 — End: 2021-04-09
  Administered 2021-04-02: 200 ug/h via INTRAVENOUS
  Administered 2021-04-02: 400 ug/h via INTRAVENOUS
  Administered 2021-04-03 – 2021-04-04 (×3): 200 ug/h via INTRAVENOUS
  Administered 2021-04-04: 250 ug/h via INTRAVENOUS
  Administered 2021-04-05 (×2): 200 ug/h via INTRAVENOUS
  Administered 2021-04-06: 175 ug/h via INTRAVENOUS
  Administered 2021-04-06 – 2021-04-07 (×2): 200 ug/h via INTRAVENOUS
  Administered 2021-04-08: 225 ug/h via INTRAVENOUS
  Administered 2021-04-08: 175 ug/h via INTRAVENOUS
  Filled 2021-04-02 (×13): qty 250

## 2021-04-02 MED ORDER — SODIUM CHLORIDE 0.9 % IV SOLN
1.0000 g | INTRAVENOUS | Status: DC
  Administered 2021-04-02 – 2021-04-03 (×2): 1 g via INTRAVENOUS
  Filled 2021-04-02 (×3): qty 1

## 2021-04-02 MED ORDER — METRONIDAZOLE 500 MG/100ML IV SOLN
500.0000 mg | Freq: Three times a day (TID) | INTRAVENOUS | Status: DC
  Administered 2021-04-02 – 2021-04-05 (×10): 500 mg via INTRAVENOUS
  Filled 2021-04-02 (×10): qty 100

## 2021-04-02 MED ORDER — MIDAZOLAM 50MG/50ML (1MG/ML) PREMIX INFUSION
0.5000 mg/h | INTRAVENOUS | Status: DC
  Administered 2021-04-02: 0.5 mg/h via INTRAVENOUS
  Filled 2021-04-02: qty 50

## 2021-04-02 MED ORDER — "THROMBI-PAD 3""X3"" EX PADS"
1.0000 | MEDICATED_PAD | Freq: Once | CUTANEOUS | Status: DC
  Filled 2021-04-02: qty 1

## 2021-04-02 MED ORDER — SODIUM CHLORIDE 0.9 % IV SOLN
100.0000 mg | INTRAVENOUS | Status: DC
  Administered 2021-04-03 – 2021-04-05 (×3): 100 mg via INTRAVENOUS
  Filled 2021-04-02 (×3): qty 100

## 2021-04-02 MED ORDER — VITAL 1.5 CAL PO LIQD
1000.0000 mL | ORAL | Status: DC
  Administered 2021-04-02 – 2021-04-05 (×4): 1000 mL

## 2021-04-02 MED ORDER — OXYCODONE HCL 5 MG/5ML PO SOLN
5.0000 mg | ORAL | Status: DC | PRN
  Administered 2021-04-10 – 2021-04-14 (×6): 10 mg
  Filled 2021-04-02 (×7): qty 10

## 2021-04-02 MED ORDER — SODIUM CHLORIDE 0.9 % IV SOLN
200.0000 mg | Freq: Once | INTRAVENOUS | Status: AC
  Administered 2021-04-02: 200 mg via INTRAVENOUS
  Filled 2021-04-02: qty 200

## 2021-04-02 MED ORDER — IPRATROPIUM-ALBUTEROL 0.5-2.5 (3) MG/3ML IN SOLN
3.0000 mL | Freq: Four times a day (QID) | RESPIRATORY_TRACT | Status: DC
  Administered 2021-04-03 – 2021-04-06 (×14): 3 mL via RESPIRATORY_TRACT
  Filled 2021-04-02 (×14): qty 3

## 2021-04-02 MED ORDER — PANTOPRAZOLE SODIUM 40 MG IV SOLR
40.0000 mg | INTRAVENOUS | Status: DC
  Filled 2021-04-02: qty 40

## 2021-04-02 MED ORDER — PRISMASOL BGK 4/2.5 32-4-2.5 MEQ/L EC SOLN: Status: DC

## 2021-04-02 MED ORDER — CALCIUM GLUCONATE-NACL 1-0.675 GM/50ML-% IV SOLN
1.0000 g | Freq: Once | INTRAVENOUS | Status: AC
  Administered 2021-04-02: 1000 mg via INTRAVENOUS
  Filled 2021-04-02: qty 50

## 2021-04-02 NOTE — Procedures (Signed)
Cortrak  Tube Type:  Cortrak - 43 inches Tube Location:  Right nare Initial Placement:  Stomach Secured by: Bridle Technique Used to Measure Tube Placement:  Documented cm marking at nare/ corner of mouth Cortrak Secured At:  77 cm  Cortrak Tube Team Note:  Consult received to place a Cortrak feeding tube.   X-ray is required, abdominal x-ray has been ordered by the Cortrak team. Please confirm tube placement before using the Cortrak tube.   If the tube becomes dislodged please keep the tube and contact the Cortrak team at [email or web address] ([email or web address]) for replacement.  If after hours and replacement cannot be delayed, place a NG tube and confirm placement with an abdominal x-ray.   Koleen Distance MS, RD, LDN Please refer to [facility] for RD and/or RD on-call/weekend/after hours pager

## 2021-04-02 NOTE — Progress Notes (Signed)
[facility] Progress Note Patient Name: David Rose. DOB: Mar 17, 1966 MRN: [id number]   Date of Service  04/02/2021  HPI/Events of Note  Patient with soft blood pressures despite high infusion rates of multiple pressors, likely because of respiratory acidosis but RR is at 34 and peak airway pressures are in the mid 30's leaving little room to augment minute ventilation, sedation is also an issue as he is currently on no sedation at all due to MAP's in the 50's.  eICU Interventions  Will add Phenylephrine gtt, start a Versed infusion and try to keep the infusion rate maxed at < 5 mg  / hour, if hypotension persists, and sedation is sub-optimal, will consider starting a Ketamine infusion.        David Rose 04/02/2021, 12:47 AM

## 2021-04-02 NOTE — Progress Notes (Signed)
ANTICOAGULATION CONSULT NOTE  Pharmacy Consult for heparin Indication: atrial flutter  Allergies  Allergen Reactions   Shellfish-Derived Products Anaphylaxis   Wasp Venom Protein Anaphylaxis   Penicillins Other (See Comments)    Childhood reaction    Patient Measurements: Height: '6\' 2"'$  (188 cm) Weight: 75.7 kg (166 lb 14.2 oz) IBW/kg (Calculated) : 82.2 Heparin Dosing Weight: 79kg  Vital Signs: Temp: 97.5 F (36.4 C) (06/10 1900) Temp Source: Oral (06/10 1900) BP: 117/65 (06/10 2200) Pulse Rate: 103 (06/10 2200)  Labs: Recent Labs    03/31/21 0356 03/31/21 0951 04/01/21 1328 04/01/21 1526 04/01/21 1529 04/01/21 1801 04/01/21 2212 04/02/21 0344 04/02/21 0352 04/02/21 0817 04/02/21 0928 04/02/21 1347 04/02/21 1713 04/02/21 2146  HGB 9.9*   < > 9.9*   < >  --    < >  --  9.8* 12.2*  --  9.5*  --  9.8*  --   HCT 31.7*   < > 33.2*   < >  --    < >  --  30.3* 36.0*  --  28.9*  --  29.3*  --   PLT 204   < > 326  --   --   --   --  272  --   --  281  --  286  --   LABPROT 17.9*  --  16.2*  --   --   --   --   --   --   --   --   --   --   --   INR 1.5*  --  1.3*  --   --   --   --   --   --   --   --   --   --   --   HEPARINUNFRC  --    < >  --   --   --   --  0.11*  --   --  0.18*  --   --   --  >1.10*  CREATININE 2.63*   < > 4.19*  --   --   --  4.33* 4.60*  --   --   --  4.17* 3.77*  --   TROPONINIHS  --   --  99*  --  111*  --   --   --   --   --   --   --   --   --    < > = values in this interval not displayed.     Estimated Creatinine Clearance: 23.7 mL/min (A) (by C-G formula based on SCr of 3.77 mg/dL (H)).   Medical History: Past Medical History:  Diagnosis Date   ESRD (end stage renal disease) ([place])    Hypertension    Peripheral arterial disease ([place])    Type 2 diabetes mellitus ([place])    Assessment: 34 YOF with recent CABG now with postop afib/flutter. There was concern several days ago immediately post op for HIT as plt count dropped to 30s.  Antibody was ordered and shows sent, actual lab/blood draw never actually sent out/received by lab although shows in process. Discussed with CCM/HF and ok with low dose heparin without bolusing for Aflutter and will disregard HIT antibody in process. Heparin started on 6/7 PM.   Heparin level supratherapeutic. Level drawn peripherally, but on the same side as the heparin infusion. Will hold for 1 hour and restart at lower rate  Goal of Therapy:  Heparin level 0.3-0.5  units/ml  Monitor platelets by anticoagulation protocol: Yes   Plan:  - Hold heparin drip for 1 hour - Resume Heparin at 1950 units/hr (19.5 ml/hr) - Will continue to monitor for any signs/symptoms of bleeding and will follow up with heparin level in 8 hours   Thank you for allowing pharmacy to be a part of this patient's care.  Alanda Slim, PharmD, [facility] Clinical Pharmacist Please see AMION for all Pharmacists' Contact Phone Numbers 04/02/2021, 10:36 PM    **Pharmacist phone directory can now be found on [email or web address] ([email or web address]).  Listed under [place].

## 2021-04-02 NOTE — Progress Notes (Addendum)
[place] [facility] Progress Note Patient Name: David Rose. DOB: 01/04/1966 MRN: [id number]   Date of Service  04/02/2021  HPI/Events of Note  WBC jumped to 87.8 K despite patient being on Vancomycin and Cefepime for pneumonia,  corrected calcium is 8.6 gm / dl.  eICU Interventions  ID consultation in the a.m., (I spoke with Dr. Dietrich [place]), Calcium gluconate 1 gm iv bolus ordered.        [name] [name] 04/02/2021, 5:12 AM

## 2021-04-02 NOTE — Progress Notes (Addendum)
NAME:  David Crooker., MRN:  [id number], DOB:  1966-03-30, LOS: 58 ADMISSION DATE:  02/23/2021, CONSULTATION DATE:  04/03/2021 REFERRING MD:  TCTS CHIEF COMPLAINT: Code   History of Present Illness:  55 year old man with PMHx significant for HTN, T2DM, PAD, ESRD, cirrhosis (query cardiac etiology) who presented for symptomatic ischemic cardiomyopathy.    Patient underwent preoperative optimization with CHF, GI, and Nephrology teams with eventual CABG 5/31. On 6/1, patient developed respiratory distress followed by PEA arrest requiring CPR. Code was c/b increased bleeding around surgical sites, prompting mediastinal exploration 6/1. Intraoperative findings were notable for bleeding around mammary graft site and diffuse oozing for which patient received multiple blood products.   Patient remained intubated on pressors postoperatively and PCCM was consulted for assistance with management.  Pertinent Medical History:  ESRD on HD Cardiac cirrhosis with portal hypertension IBS IDDM  Significant Hospital Events: Including procedures, antibiotic start and stop dates in addition to other pertinent events   5/23 Admitted 5/26 Paracentesis 4L 5/30 HD 5/31 CABG 5/31- 6/1 Code Blue, OR for mediastinal exploration 2/2 hemorrhagic shock induced by CPR. Factor 7, protamine, FFP, cryo given 6/1 PCCM consult, 4U PRBCs and 1U Plt . R pigtail chest tube placed due to limitation of vent weaning. 6/2 Extubated, chest tube 3.2L out past 24 hrs 6/3 Remains on 2L [place], chest tube with 500cc out/ 24 hrs 6/4 Remains on CRRT, 2L [place], [place], [place]. 3 chest tubes remain. 6/6 Remains on CRRT, 4 chest tubes in place 6/9 Decreasing pressor requirements and CT output. CRRT transitioned to iHD. Echo repeated. Prior to HD session, patient suddenly became unresponsive with fixed and dilated pupils, minimal gag, breathing spontaneously. Code Stroke called. Reintubated, heparin d/c'ed, STAT CT Head/CTA. 6/10, WBC jump to 89  K, Temp of 102,Remains on 40 of Levo, 40 of Neo, and Vaso at 0.03.Pupils are 6 and non-responsive to light. Trialysis cath inserted for CVVHD. R IJ is 64 days old, will get PICC and discontinue. Lactate of 2.8   Interim History / Subjective:  Sedated and intubated, unresponsive, requiring high pressor support. Pupils remain large and non-reactive, but does follow commands Temp Max to 102, with WBC of 89 K Placing Trialysis cath currently, to initiate CVVHD Lactate remains 2.8 Co-ox 88%  Objective   Blood pressure (!) 118/47, pulse (!) 111, temperature (!) 102.3 F (39.1 C), temperature source Oral, resp. rate (!) 34, height '6\' 2"'  (1.88 m), weight 75.7 kg, SpO2 100 %. 4LNC    Vent Mode: PRVC FiO2 (%):  [70 %-100 %] 70 % Set Rate:  [18 bmp-34 bmp] 34 bmp Vt Set:  [490 mL-650 mL] 570 mL PEEP:  [8 cmH20-10 cmH20] 10 cmH20 Plateau Pressure:  [29 cmH20-34 cmH20] 29 cmH20   Intake/Output Summary (Last 24 hours) at 04/02/2021 1040 Last data filed at 04/02/2021 1000 Gross per 24 hour  Intake 2378.45 ml  Output 1555 ml  Net 823.45 ml   Filed Weights   03/31/21 0647 04/01/21 0500 04/02/21 0452  Weight: 75.8 kg 78.3 kg 75.7 kg   Physical Examination: (6/9AM) General: Chronically ill-appearing middle-aged man in NAD., sedated and intubated HEENT: [place]/AT, anicteric sclera, PERRL, dry mucous membranes. No LAD, No JVD, [place] secure and intact Neuro: sedated and intubated , per nursing follows some commands, MAE x 4 per nursing, Had received sedation prior to my assessment   CV: RRR, no m/g/r. Midline sternotomy, well-healing.ST per tele PULM: Bilateral chest excursion, Oral ETT, secure and intact,  Upper lung fields clear,  diminished at bases R > L, scattered crackles.Right CT with 1 chamber leak noted. Per CXR it is not in the lung. GI: Soft, nontender, nondistended. Diminished  bowel sounds. Extremities: No LE edema noted. Skin: Warm/dry, no rashes.  Labs/imaging that I have personally  reviewed: (right click and "Reselect all SmartList Selections" daily)  AM Labs: WBC 87.8 (19.6), H&H 9.5/ / 28.9, Plt 281   Na 130 (132), K 4.3, CO2 18, BUN 50, Cr 4.6 (4.3) Ca 7.4, Mg 2.1  Co-ox 88.0%  LDH 232, alk phos 205,AST 44, total bili 1.8  Post-Code Stroke:  Resolved Hospital Problem list     Assessment & Plan:  Acute encephalopathy prompting Code Stroke Patient became acutely unresponsive 6/9 with fixed and dilated pupils, minimal gag, breathing spontaneously. Code Stroke called. Reintubated, heparin d/c'ed, STAT CT Head/CTA. Now following some commands 6/10  - Code Stroke initiated 6/9 - Neurology consulted, appreciate recs - CT Head, CTA Head/Neck, CTA Chest pending - Frequent orientation  - EEG >> suggestive of moderate diffuse encephalopathy, nonspecific etiology. No seizures or epileptiform discharges were seen   Acute hypoxemic respiratory failure - improved Bilateral pleural effusions;  inferior R sided pigtail tube no longer in place, removed 6/10; surgical CT removed 6/7 Likely LLL HAP - Postoperative care per TCTS, managing CT - Continue full vent support (4-8cc/kg IBW) - Wean FiO2 for O2 sat > 90% - Daily WUA/SBT when clinically appropriate>> no weaning 6/10 - VAP bundle - Pulmonary hygiene (IS, flutter valve) once extubated  - PAD protocol for sedation - Continue Cefepime for ?HCAP, Vanc, Eraxis  New Leukocytosis 6/10 Fever to 102 Plan Pan Culture Continue broad spectrum antibiotic coverage  as noted above Add fungal coverage 6/10 CT Chest / Abdomen/ Pelvis to assess for abscess/ source Discontinue Triple lumen cath  Place PICC line Follow Cultures and narrow  ABX coverage as able Trend fever and WBC curve Trend Lactate  Post-op mediastinal hemorrhage Cirrhotic and hemorrhage- induced consumptive coagulopathy Hemorrhagic shock, resolved Acute blood loss anemia (improving) Thrombocytopenia- (Improving): in setting of blood loss,  coagulopathy, and cirrhosis Elevated Alk Phos/AST P: - Trend CBC - Transfuse for Hgb < 7.0 - monitor for obvious bleeding - Trend LFT's  In hospital cardiac arrest precipitated by respiratory distress- PEA, minimal downtime. 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 VT- On 6/1 - Appreciate HF team assistance - F/u repeat Echo 6/9>> EF 45-50% - Goal MAP 65 - Titrate Levophed / Neo to MAP goal - continue Vasopressin at 0.03 - Continue midodrine 41m TID - Trend Co-ox - Heparin gtt per pharmacy - Continue ASA, statin - Sildenafil for pulmonary HTN  ESRD on HD PTA: known IJ stenosis and concern of R IJ stenosis; difficult access, RUE AVF Trialysis catheter placed 6/10 - Nephro following, appreciate recs - Plan for CVVHD 6/10 as pressor dependent  and to correct acidosis - Trend BMP - Replete electrolytes as indicated - Monitor I&Os - Avoid nephrotoxic agents as able - Ensure adequate renal perfusion  DM2, controlled CBG's low overnight - SSI - CBG Q4H while NPO - Will hold Lantus 6/10>> re-evaluate 6/11  Best practice (right click and "Reselect all SmartList Selections" daily)  Diet:  NPO>> Tube feeds Pain/Anxiety/Delirium protocol (if indicated): Yes (RASS goal 0) VAP protocol (if indicated): Yes DVT prophylaxis: Systemic AC - discontinued at the time of Code Stroke GI prophylaxis: PPI Glucose control:  SSI Yes Central venous access:  Yes, and it is still needed Arterial line:  N/A  Foley:  N/A Mobility:  bed rest  PT consulted: Yes Last date of multidisciplinary goals of care discussion [briefly 6/9 in the setting of Code Stroke] Code Status:  full code Disposition: per primary  Critical care time: 65 minutes   Magdalen Spatz, NP [facility] 04/02/21 10:40 AM  Please see [email or web address] for pager details.  From 7A-7P if no response, please call  [phone] After hours, please call Warren Lacy [phone]   Pulmonary critical care attending:  This is a 55 year old gentleman, past medical history of hypertension, type 2 diabetes, PAD, end-stage renal disease, cirrhosis, ischemic cardiomyopathy.  Patient underwent CABG on 03/07/2021.  On 03/26/2021 patient developed respiratory distress PEA cardiac arrest CPR, code.  Bleeding around surgical sites prompted mediastinal exploration.  Notable have bleeding around the mammary graft site.  Patient now remains intubated on mechanical life support multiple pressors.  HD catheter placed on CVVHD.  BP (!) 97/20   Pulse (!) 102   Temp 97.9 F (36.6 C) (Oral)   Resp (!) 30   Ht '6\' 2"'  (1.88 m)   Wt 75.7 kg   SpO2 100%   BMI 21.43 kg/m   General: Elderly male intubated mechanical life support HEENT: Endotracheal tube in place Heart: Regular rhythm S1-S2 none lungs: Bilateral mechanically ventilated breath sounds Abdomen: Nontender nondistended  labs: Reviewed  Assessment: Acute hypoxemic respiratory failure Patient had a acute encephalopathic event yesterday prompting code stroke was reintubated.  Unclear etiology Neurology following Acute hypoxemic respiratory intubated mechanical life support Bilateral pleural effusion Left lower lobe hospital-acquired pneumonia Coronary artery disease status post CABG, postop mediastinal hemorrhage Cirrhotic Hypertension ESRD on HD Leukocytosis  Plan: Unclear what happened on 04/01/2021 that caused him to become unresponsive requiring intubation.  CT head and stroke work-up so far negative MRI of the brain may be necessary EEG shows diffuse encephalopathy Remains on full mechanical vent support unable to wean and liberate at this time. VAP bundle PAD protocol sedation Continue cefepime Has a progressively increasing white blood cell count unclear etiology Will give Eraxis until blood cultures come back Discontinue triple-lumen catheter new HD  catheter placed today, PICC line placed planned if needed Titrate Levophed to maintain mean arterial pressure greater than 65 Continue vasopressin Continue midodrine Aspirin statin Sildenafil for PAH CBGs with SSI  This patient is critically ill with multiple organ system failure; which, requires frequent high complexity decision making, assessment, support, evaluation, and titration of therapies. This was completed through the application of advanced monitoring technologies and extensive interpretation of multiple databases. During this encounter critical care time was devoted to patient care services described in this note for 68 minutes.  [facility] 04/02/2021 5:54 PM

## 2021-04-02 NOTE — Progress Notes (Signed)
Sputum collected and sent to lab 

## 2021-04-02 NOTE — Progress Notes (Addendum)
Patient ID: David Camplin., male   DOB: 06-Aug-1966, 55 y.o.   MRN: [id number]     Advanced Heart Failure Rounding Note  PCP-Cardiologist: Carlyle Dolly, MD    Patient Profile   55 y/o male w/  history of ESRD due to DM and CHF with mid-range EF (LV EF 40-45% with moderately decreased RV systolic function and PASP 99 on 4/22 echo) as well as cirrhosis of uncertain etiology.  Based on low EF and elevated PA pressure, right and left heart cath was done in 5/22.  This showed severe 3VD, high cardiac output with moderate pulmonary hypertension but low PVR.  He is planned for CABG, but admitted pre-op for optimization.    Subjective:    CABG x 4 on 5/31 with LIMA-LAD, SVG-RCA, SVG-OM, SVG-D.   Coagulopathic post-op with multiple blood products.  Extubated post-op but developed respiratory distress => PEA arrest then VT. Reintubated.  ROSC with ACLS.  Returned to OR for mediastinal exploration 6/1.  Extubated 6/2.   6/6 Midodrine started.   6/9 AMS--> CT negative for acute bleed or ischemic CVA. Intubated. Suspected septic shock.  CTA chest showed no PE, moderate bilateral pleural effusions.   On Norepi 40 mcg + Neo 40 mcg + vaso 0.03 units.   On Cefepime for HCAP, WBCs 88K    HIT sent 6/2 (pending) -->Plts 47 => 51 => 68=>91=>191=>204=>300=>272 .     Sedated/Intubated. Per nursing, he will awaken and follow commands though currently pupils remain fixed/dilated.   He remains in atrial flutter, back on heparin gtt.   Echo (6/9) with EF 45-50%, inferolateral severe hypokinesis, moderate RV dysfunction.    Objective:   Weight Range: 75.7 kg Body mass index is 21.43 kg/m.   Vital Signs:   Temp:  [98.2 F (36.8 C)-102.3 F (39.1 C)] 102.3 F (39.1 C) (06/10 0731) Pulse Rate:  [44-139] 110 (06/10 0700) Resp:  [15-34] 34 (06/10 0700) BP: (63-142)/(42-73) 107/51 (06/10 0700) SpO2:  [82 %-100 %] 100 % (06/10 0700) FiO2 (%):  [80 %-100 %] 80 % (06/10 0351) Weight:  [75.7 kg]  75.7 kg (06/10 0452) Last BM Date: 03/31/21  Weight change: Filed Weights   03/31/21 0647 04/01/21 0500 04/02/21 0452  Weight: 75.8 kg 78.3 kg 75.7 kg    Intake/Output:   Intake/Output Summary (Last 24 hours) at 04/02/2021 0758 Last data filed at 04/02/2021 0600 Gross per 24 hour  Intake 1962.71 ml  Output 1525 ml  Net 437.71 ml      Physical Exam   General:  Intubated/sedated  HEENT: ETT Neck: supple. JVP difficult to assess. Carotids 2+ bilat; no bruits. No lymphadenopathy or thryomegaly appreciated. Cor: PMI nondisplaced. Tachy  rate & rhythm. No rubs, gallops or murmurs. Lungs: Coarse throughout Abdomen: soft, nontender, nondistended. No hepatosplenomegaly. No bruits or masses. Good bowel sounds. Extremities: no cyanosis, clubbing, rash, edema. LUE AVF  Neuro: sedated on vent  Telemetry   ST/AT 110s   EKG    N/A  Labs    CBC Recent Labs    04/01/21 1328 04/01/21 1526 04/02/21 0344 04/02/21 0352  WBC 28.1*  --  87.8*  --   NEUTROABS 21.1*  --   --   --   HGB 9.9*   < > 9.8* 12.2*  HCT 33.2*   < > 30.3* 36.0*  MCV 100.0  --  95.3  --   PLT 326  --  272  --    < > = values in this interval  not displayed.   Basic Metabolic Panel Recent Labs    04/01/21 0325 04/01/21 1328 04/01/21 1526 04/01/21 2212 04/02/21 0344 04/02/21 0352  NA 129* 127*   < > 128* 129* 130*  K 4.6 5.2*   < > 3.5 4.3 4.3  CL 95* 95*  --  98 97*  --   CO2 22 21*  --  18* 16*  --   GLUCOSE 175* 341*  --  72 102*  --   BUN 37* 43*  --  45* 50*  --   CREATININE 3.56* 4.19*  --  4.33* 4.60*  --   CALCIUM 7.2* 7.4*  --  7.2* 7.4*  --   MG 2.4 2.6*  --   --  2.1  --   PHOS 4.2  --   --   --  3.3  --    < > = values in this interval not displayed.   Liver Function Tests Recent Labs    04/01/21 0325 04/02/21 0344  AST 56* 44*  ALT 28 23  ALKPHOS 249* 205*  BILITOT 1.8* 1.8*  PROT 6.0* 5.6*  ALBUMIN 2.5* 2.2*   No results for input(s): LIPASE, AMYLASE in the last 72  hours. Cardiac Enzymes No results for input(s): CKTOTAL, CKMB, CKMBINDEX, TROPONINI in the last 72 hours.  BNP: BNP (last 3 results) Recent Labs    03/01/2021 1545  BNP 1,127.8*    ProBNP (last 3 results) No results for input(s): PROBNP in the last 8760 hours.   D-Dimer No results for input(s): DDIMER in the last 72 hours.  Hemoglobin A1C No results for input(s): HGBA1C in the last 72 hours. Fasting Lipid Panel No results for input(s): CHOL, HDL, LDLCALC, TRIG, CHOLHDL, LDLDIRECT in the last 72 hours. Thyroid Function Tests No results for input(s): TSH, T4TOTAL, T3FREE, THYROIDAB in the last 72 hours.  Invalid input(s): FREET3  Other results:   Imaging    DG Abd 1 View  Result Date: 04/02/2021 CLINICAL DATA:  MRI clearance EXAM: ABDOMEN - 1 VIEW COMPARISON:  None. FINDINGS: Normal abdominal gas pattern. Nasogastric tube seen in the expected distal body of the stomach. No unexpected metallic foreign body noted within the visualized abdomen. IMPRESSION: No unexpected metallic foreign body within the abdomen. Electronically Signed   By: Fidela Salisbury MD   On: 04/02/2021 00:36   CT Angio Chest Pulmonary Embolism (PE) W or WO Contrast  Result Date: 04/01/2021 CLINICAL DATA:  Chest pain, shortness of breath, worsening shock, altered mental status chest EXAM: CT ANGIOGRAPHY CHEST WITH CONTRAST TECHNIQUE: Multidetector CT imaging of the chest was performed using the standard protocol during bolus administration of intravenous contrast. Multiplanar CT image reconstructions and MIPs were obtained to evaluate the vascular anatomy. CONTRAST:  156m OMNIPAQUE IOHEXOL 350 MG/ML SOLN IV COMPARISON:  CT chest 02/22/2021 FINDINGS: Cardiovascular: Atherosclerotic calcifications aorta, proximal great vessels and coronary arteries. Aorta normal caliber without aneurysm or dissection. Heart unremarkable. No pericardial effusion. Pulmonary arteries adequately opacified and patent. Lower lobe  pulmonary arteries suboptimally assessed due to degree of atelectasis. No definite evidence of pulmonary embolism. Mediastinum/Nodes: Esophagus unremarkable. No thoracic adenopathy. Base of cervical region normal appearance. Stranding in anterior mediastinum consistent with recent median sternotomy. Lungs/Pleura: BILATERAL pleural effusions. RIGHT thoracostomy tube. Extensive atelectasis versus consolidation of lower lobes. Patchy airspace infiltrates in the upper lobes bilaterally and RIGHT middle lobe, question edema versus infection. No pneumothorax. Upper Abdomen: Small amount of perihepatic free fluid. Extensive atherosclerotic calcifications. Musculoskeletal: No acute osseous findings.  Review of the MIP images confirms the above findings. IMPRESSION: No definite evidence of pulmonary embolism. Moderate-sized BILATERAL pleural effusions and significant atelectasis versus consolidation of lower lobes. Mild patchy infiltrates in remaining lungs which may represent edema or infection. Stranding in anterior mediastinum consistent with recent median sternotomy. Aortic Atherosclerosis (ICD10-I70.0). Electronically Signed   By: Lavonia Dana M.D.   On: 04/01/2021 15:14   DG CHEST PORT 1 VIEW  Result Date: 04/01/2021 CLINICAL DATA:  Intubation EXAM: PORTABLE CHEST 1 VIEW COMPARISON:  Portable exam 1445 hours compared to 04/01/2021 FINDINGS: Tip of endotracheal tube projects 5.3 cm above carina. Nasogastric tube extends into stomach. RIGHT jugular line with tip projecting over confluence of SVC. External pacing leads present. Normal heart size post median sternotomy. Bibasilar effusions and atelectasis greater on RIGHT. No pneumothorax or segmental infiltrate. IMPRESSION: Bibasilar pleural effusions and atelectasis greater on RIGHT. Electronically Signed   By: Lavonia Dana M.D.   On: 04/01/2021 14:55   EEG adult  Result Date: 04/01/2021 Lora Havens, MD     04/01/2021  6:01 PM Patient Name: [name].  MRN: [id number] Epilepsy Attending: Lora Havens Referring Physician/Provider: Dr Noemi Chapel Date: 04/01/2021 Duration: 27.16 mins  Patient history: 55yo M s/p cardiac arrest, ws improving but had an episode of sudden onset unresponsiveness. EEG to evaluate for seizure  Level of alertness: awake  AEDs during EEG study: None  Technical aspects: This EEG study was done with scalp electrodes positioned according to the 10-20 International system of electrode placement. Electrical activity was acquired at a sampling rate of 500Hz and reviewed with a high frequency filter of 70Hz and a low frequency filter of 1Hz. EEG data were recorded continuously and digitally stored.  Description: EEG showed continuous generalized 5 to 6 Hz theta as well as intermittent 2-3Hz delta slowing. Hyperventilation and photic stimulation were not performed.     ABNORMALITY - Continuous slow, generalized  IMPRESSION: This study is suggestive of moderate diffuse encephalopathy, nonspecific etiology. No seizures or epileptiform discharges were seen throughout the recording.    Lora Havens    ECHOCARDIOGRAM COMPLETE  Result Date: 04/01/2021    ECHOCARDIOGRAM REPORT   Patient Name:   David Guyton. Date of Exam: 04/01/2021 Medical Rec #:  [id number]           Height:       74.0 in Accession #:    [id number]          Weight:       172.6 lb Date of Birth:  May 24, 1966            BSA:          2.042 m Patient Age:    57 years            BP:           90/50 mmHg Patient Gender: M                   HR:           97 bpm. Exam Location:  Inpatient Procedure: 2D Echo, Color Doppler and Cardiac Doppler Indications:    CHF  History:        Patient has prior history of Echocardiogram examinations.  Sonographer:    NA Referring Phys: [place]  1. Left ventricular ejection fraction, by estimation, is 45 to 50%. The left ventricle has mildly decreased function. The left ventricle demonstrates regional wall  motion abnormalities  with basal to mid inferolateral severe hypokinesis. There is mild left ventricular hypertrophy. Left ventricular diastolic parameters are indeterminate due to atrial flutter.  2. Right ventricular systolic function is moderately reduced. The right ventricular size is mildly enlarged. Mildly increased right ventricular wall thickness.  3. Left atrial size was moderately dilated.  4. Right atrial size was mildly dilated.  5. The mitral valve is normal in structure. No evidence of mitral valve regurgitation. No evidence of mitral stenosis.  6. The aortic valve is tricuspid. Aortic valve regurgitation is not visualized. Mild aortic valve sclerosis is present, with no evidence of aortic valve stenosis.  7. The inferior vena cava is dilated in size with <50% respiratory variability, suggesting right atrial pressure of 15 mmHg. FINDINGS  Left Ventricle: Left ventricular ejection fraction, by estimation, is 45 to 50%. The left ventricle has mildly decreased function. The left ventricle demonstrates regional wall motion abnormalities. The left ventricular internal cavity size was normal in size. There is mild left ventricular hypertrophy. Left ventricular diastolic parameters are indeterminate. Right Ventricle: The right ventricular size is mildly enlarged. Mildly increased right ventricular wall thickness. Right ventricular systolic function is moderately reduced. Left Atrium: Left atrial size was moderately dilated. Right Atrium: Right atrial size was mildly dilated. Pericardium: Trivial pericardial effusion is present. Mitral Valve: The mitral valve is normal in structure. There is mild calcification of the mitral valve leaflet(s). Mild mitral annular calcification. No evidence of mitral valve regurgitation. No evidence of mitral valve stenosis. Tricuspid Valve: The tricuspid valve is normal in structure. Tricuspid valve regurgitation is trivial. Aortic Valve: The aortic valve is tricuspid. Aortic valve regurgitation is  not visualized. Mild aortic valve sclerosis is present, with no evidence of aortic valve stenosis. Aortic valve mean gradient measures 5.0 mmHg. Aortic valve peak gradient measures 8.4 mmHg. Aortic valve area, by VTI measures 2.41 cm. Pulmonic Valve: The pulmonic valve was normal in structure. Pulmonic valve regurgitation is not visualized. Aorta: The aortic root is normal in size and structure. Venous: The inferior vena cava is dilated in size with less than 50% respiratory variability, suggesting right atrial pressure of 15 mmHg. IAS/Shunts: No atrial level shunt detected by color flow Doppler.  LEFT VENTRICLE PLAX 2D LVIDd:         4.10 cm  Diastology LVIDs:         3.80 cm  LV e' medial:    8.16 cm/s LV PW:         0.70 cm  LV E/e' medial:  14.0 LV IVS:        1.30 cm  LV e' lateral:   7.51 cm/s LVOT diam:     2.10 cm  LV E/e' lateral: 15.2 LV SV:         53 LV SV Index:   26 LVOT Area:     3.46 cm  RIGHT VENTRICLE            IVC RV Basal diam:  3.60 cm    IVC diam: 2.70 cm RV Mid diam:    3.50 cm RV S prime:     6.05 cm/s TAPSE (M-mode): 1.0 cm LEFT ATRIUM             Index       RIGHT ATRIUM           Index LA diam:        2.90 cm 1.42 cm/m  RA Area:     28.70 cm LA Vol (  A2C):   83.0 ml 40.65 ml/m RA Volume:   105.00 ml 51.43 ml/m LA Vol (A4C):   53.2 ml 26.06 ml/m LA Biplane Vol: 66.5 ml 32.57 ml/m  AORTIC VALVE AV Area (Vmax):    2.41 cm AV Area (Vmean):   2.10 cm AV Area (VTI):     2.41 cm AV Vmax:           145.00 cm/s AV Vmean:          100.000 cm/s AV VTI:            0.218 m AV Peak Grad:      8.4 mmHg AV Mean Grad:      5.0 mmHg LVOT Vmax:         101.00 cm/s LVOT Vmean:        60.700 cm/s LVOT VTI:          0.152 m LVOT/AV VTI ratio: 0.70  AORTA Ao Root diam: 3.00 cm Ao Asc diam:  2.80 cm MITRAL VALVE                TRICUSPID VALVE MV Area (PHT): 4.54 cm     TR Peak grad:   45.4 mmHg MV Decel Time: 167 msec     TR Vmax:        337.00 cm/s MV E velocity: 114.00 cm/s                              SHUNTS                             Systemic VTI:  0.15 m                             Systemic Diam: 2.10 cm Loralie Champagne MD Electronically signed by Loralie Champagne MD Signature Date/Time: 04/01/2021/2:37:18 PM    Final    CT HEAD CODE STROKE WO CONTRAST  Result Date: 04/01/2021 CLINICAL DATA:  Code stroke.  Neuro deficit, acute stroke suspected. EXAM: CT HEAD WITHOUT CONTRAST TECHNIQUE: Contiguous axial images were obtained from the base of the skull through the vertex without intravenous contrast. COMPARISON:  None. FINDINGS: Brain: No evidence of acute large vascular territory infarction, hemorrhage, hydrocephalus, extra-axial collection or mass lesion/mass effect. Small remote appearing lacunar infarcts in bilateral frontal lobe white matter. Evaluation of posterior fossa is limited by streak artifact. Vascular: No hyperdense vessel identified. Skull: No acute fracture. Sinuses/Orbits: Opacified anterior right ethmoid air cell with mild mucosal thickening of the ethmoid air cells. Otherwise clear sinuses. Unremarkable orbits. Other: No mastoid effusions. ASPECTS [facility] Stroke Program Early CT Score) total score (0-10 with 10 being normal): 10. IMPRESSION: 1. No evidence of acute large vascular territory infarct or acute hemorrhage. ASPECTS is 10. 2. Small remote appearing lacunar infarcts in bilateral frontal lobe white matter. An MRI could provide more sensitive evaluation for acute infarct if clinically indicated. Code stroke imaging results were communicated on 04/01/2021 at 2:06 pm to provider Dr. Quinn Axe via secure text paging. Electronically Signed   By: Margaretha Sheffield MD   On: 04/01/2021 14:07   CT ANGIO HEAD CODE STROKE  Result Date: 04/01/2021 CLINICAL DATA:  Neuro deficit, acute stroke suspected. EXAM: CT ANGIOGRAPHY HEAD AND NECK TECHNIQUE: Multidetector CT imaging of the head and neck was performed using the standard protocol during bolus administration of  intravenous contrast. Multiplanar CT  image reconstructions and MIPs were obtained to evaluate the vascular anatomy. Carotid stenosis measurements (when applicable) are obtained utilizing NASCET criteria, using the distal internal carotid diameter as the denominator. CONTRAST:  80m OMNIPAQUE IOHEXOL 350 MG/ML SOLN COMPARISON:  None. FINDINGS: CTA NECK FINDINGS Aortic arch: Calcific atherosclerosis of the aorta. Great vessel origins are patent. Right carotid system: Predominately calcific atherosclerosis at the carotid bifurcation without greater than 50% narrowing. The internal carotid artery is small throughout its course without focal hemodynamically significant stenosis. Left carotid system: Predominately calcific atherosclerosis at the carotid bifurcation without greater than 50% narrowing. The internal carotid artery is small throughout its course without focal hemodynamically significant stenosis. Vertebral arteries: Severe stenosis of the right vertebral artery origin secondary to atherosclerosis. Skeleton: No evidence of acute abnormality on limited assessment. Other neck: Mild diffuse soft tissue swelling. Upper chest: Please see concurrent CT chest for intrathoracic findings. Review of the MIP images confirms the above findings CTA HEAD FINDINGS Anterior circulation: Bilateral calcific and paraclinoid ICA calcific atherosclerosis without evidence of flow limiting stenosis. Bilateral MCAs and ACAs are patent without evidence of proximal hemodynamically significant stenosis. No aneurysm. Posterior circulation: Small left vertebral artery. Moderate left and mild right narrowing of the intradural vertebral arteries proximally due to calcific atherosclerosis. The basilar artery and bilateral posterior cerebral arteries are patent without proximal hemodynamically significant stenosis. No aneurysm. Venous sinuses: As permitted by contrast timing, patent. Review of the MIP images confirms the above findings IMPRESSION: CTA Head: 1. No large vessel  occlusion. 2. 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 CTA Neck: 1. Severe atherosclerotic narrowing of the right vertebral artery origin. 2. Bilateral carotid bifurcation atherosclerosis without greater than 50% narrowing. 3. Small internal carotid arteries in the neck, nonspecific but potentially chronic/congenital. 4. Mild diffuse subcutaneous soft tissue swelling, suggestive of anasarca. 5. Please see concurrent CT chest for intrathoracic findings. Findings discussed with Dr. SFuller Planat 2:28 p.m. via telephone. Electronically Signed   By: FMargaretha SheffieldMD   On: 04/01/2021 14:40   CT ANGIO NECK CODE STROKE  Result Date: 04/01/2021 CLINICAL DATA:  Neuro deficit, acute stroke suspected. EXAM: CT ANGIOGRAPHY HEAD AND NECK TECHNIQUE: Multidetector CT imaging of the head and neck was performed using the standard protocol during bolus administration of intravenous contrast. Multiplanar CT image reconstructions and MIPs were obtained to evaluate the vascular anatomy. Carotid stenosis measurements (when applicable) are obtained utilizing NASCET criteria, using the distal internal carotid diameter as the denominator. CONTRAST:  764mOMNIPAQUE IOHEXOL 350 MG/ML SOLN COMPARISON:  None. FINDINGS: CTA NECK FINDINGS Aortic arch: Calcific atherosclerosis of the aorta. Great vessel origins are patent. Right carotid system: Predominately calcific atherosclerosis at the carotid bifurcation without greater than 50% narrowing. The internal carotid artery is small throughout its course without focal hemodynamically significant stenosis. Left carotid system: Predominately calcific atherosclerosis at the carotid bifurcation without greater than 50% narrowing. The internal carotid artery is small throughout its course without focal hemodynamically significant stenosis. Vertebral arteries: Severe stenosis of the right vertebral artery origin secondary to atherosclerosis. Skeleton: No evidence of acute  abnormality on limited assessment. Other neck: Mild diffuse soft tissue swelling. Upper chest: Please see concurrent CT chest for intrathoracic findings. Review of the MIP images confirms the above findings CTA HEAD FINDINGS Anterior circulation: Bilateral calcific and paraclinoid ICA calcific atherosclerosis without evidence of flow limiting stenosis. Bilateral MCAs and ACAs are patent without evidence of proximal hemodynamically significant stenosis. No aneurysm. Posterior circulation:  Small left vertebral artery. Moderate left and mild right narrowing of the intradural vertebral arteries proximally due to calcific atherosclerosis. The basilar artery and bilateral posterior cerebral arteries are patent without proximal hemodynamically significant stenosis. No aneurysm. Venous sinuses: As permitted by contrast timing, patent. Review of the MIP images confirms the above findings IMPRESSION: CTA Head: 1. No large vessel occlusion. 2. 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 CTA Neck: 1. Severe atherosclerotic narrowing of the right vertebral artery origin. 2. Bilateral carotid bifurcation atherosclerosis without greater than 50% narrowing. 3. Small internal carotid arteries in the neck, nonspecific but potentially chronic/congenital. 4. Mild diffuse subcutaneous soft tissue swelling, suggestive of anasarca. 5. Please see concurrent CT chest for intrathoracic findings. Findings discussed with Dr. Fuller Plan at 2:28 p.m. via telephone. Electronically Signed   By: Margaretha Sheffield MD   On: 04/01/2021 14:40      Medications:     Scheduled Medications:  (feeding supplement) PROSource Plus  30 mL Oral TID BM   amiodarone  200 mg Oral BID   aspirin EC  81 mg Oral Daily   atorvastatin  40 mg Oral Daily   bisacodyl  10 mg Oral Daily   Or   bisacodyl  10 mg Rectal Daily   chlorhexidine gluconate (MEDLINE KIT)  15 mL Mouth Rinse BID   Chlorhexidine Gluconate Cloth  6 each Topical Daily    cinacalcet  30 mg Oral Q T,Th,Sa-HD   darbepoetin (ARANESP) injection - NON-DIALYSIS  60 mcg Subcutaneous Q Thu-1800   docusate sodium  200 mg Oral Daily   feeding supplement  1 Container Oral TID WC   feeding supplement  237 mL Oral TID BM   insulin aspart  0-24 Units Subcutaneous Q4H   insulin glargine  10 Units Subcutaneous Daily   ipratropium-albuterol  3 mL Nebulization Q4H   mouth rinse  15 mL Mouth Rinse 10 times per day   midodrine  15 mg Oral TID WC   multivitamin  1 tablet Oral QHS   pantoprazole  40 mg Oral Daily   polyethylene glycol  17 g Oral Daily   sildenafil  20 mg Oral TID   sodium chloride flush  10 mL Intracatheter Q8H   sodium chloride flush  10-40 mL Intracatheter Q12H   sodium chloride flush  3 mL Intravenous Q12H   traMADol  50-100 mg Oral Q12H   vancomycin variable dose per unstable renal function (pharmacist dosing)   Does not apply See admin instructions    Infusions:  sodium chloride     ceFEPime (MAXIPIME) IV Stopped (04/01/21 1806)   heparin 1,900 Units/hr (04/02/21 0600)   lactated ringers     lactated ringers Stopped (04/01/21 1902)   midazolam 1 mg/hr (04/02/21 0600)   norepinephrine (LEVOPHED) Adult infusion 40 mcg/min (04/02/21 0600)   phenylephrine (NEO-SYNEPHRINE) Adult infusion 40 mcg/min (04/02/21 0600)   vasopressin 0.03 Units/min (04/02/21 0600)    PRN Medications: lactated ringers, lidocaine (PF), lip balm, metoprolol tartrate, morphine injection, ondansetron (ZOFRAN) IV, oxyCODONE, sodium chloride flush, sodium chloride flush     Assessment/Plan   1. CAD: Severe 3VD with decreased EF.  I reviewed films with Dr. Ellyn Hack, PCI would be possible but would be difficult/high risk with multiple lesions and heavy calcification. 5/31 LIMA-LAD, SVG-RCA, SVG-OM, SVG-D. - Once COr trak place will resume ASA 81 + atorvastatin 40 mg daily.  2. Acute/chronic HF with mid range EF => cardiogenic shock post-CABG: Suspect ischemic cardiomyopathy.   Echo 4/22 with EF  40-45% with moderately decreased RV systolic function and PASP 99, moderate TR. There was a prominent component of RV failure with RA pressure elevated out of proportion to PCWP (CVP/PCWP 0.875 on RHC) but PAPI adequate at 2.8. Post-op had PEA arrest then VT.  On 6/9, episode of altered mental status, progressive hypotension, and intubated.  Suspect septic shock now.  Echo (6/9) with EF 45-50%, inferolateral severe hypokinesis, moderate RV dysfunction.  Co-ox 88% (?accuracy).  - On NE 40 mcg + Neo 40 mcg + vasopressin 0.03 units, wean as able.   - Stop midodrine  3. ESRD: Suspected due to diabetes.  Volume up post-op with multiple blood products.  - Will need CVVHD today => will need line for CVVH, looks like will have to be a groin line.  4. 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  The CO was not markedly high.  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 Oxygen saturation was 99% on RA pre-op, so no evidence for hepatopulmonary syndrome.   Moderate PH currently.  - Stop sildenafil 20 mg tid with pressor requirements.  5. Cirrhosis: Noted on abdominal US from 2020.  H/o paracentesis.  Had workup at [facility] (though patient does not remember this), viral hepatitis labs negative.  They ended up think that the cirrhosis was cardiogenic.  Repeat abdominal US 5/23 w/ nodular hepatic parenchymal pattern with increased echogenicity consistent with the patient's known cirrhosis. No focal hepatic abnormality identified. Portal vein is patent. Moderate ascites. GI has seen, patient had 4L paracentesis on 5/26. 6. Coagulopathy: Post-op bleeding in setting of coagulopathy post-CABG in patient with cirrhosis.  Currently seems controlled.  Platelets dropped, HIT sent => plts recovered to 300, think drop may have been due to sepsis.  7. VT: Had after PEA arrest am 6/1.  - Stop po amio. Start  IV amio while on pressors.   8. Anemia: ABLA.  Stable today. Hgb stable. 9. Pleural effusions: Right effusion with chest tube, ?hepatic hydrothorax.  CT drainage slowing.     10. PEA arrest: Respiratory arrest post-extubation.  ROSC with CPR.  11. Acute hypoxemic respiratory failure:  Extubated 6/2 . 12. Atrial fibrillation: Atypical atrial flutter currently.   - Stop po amio . Start amio drip - heparin gtt started 6/7, discussed with CCM and do not think HIT.  - Eventual DCCV.  13. ID: Cefepime / Vancomcycin sepsis syndrome/HCAP.  Septic shock.  WBC 88. Febrile.  - Obtain cultures.  - Dr Orvan Seen changing lines today.  14. Neuro: Altered mental status 6/9 with intubation.  CT head with no bleed or ischemic CVA seen.  EEG with diffuse encephalopathy.  Patient to get head MRI next.  Follows commands with sedation wean.   CRITICAL CARE Performed by: Loralie Champagne  Total critical care time: 40 minutes  Critical care time was exclusive of separately billable procedures and treating other patients.  Critical care was necessary to treat or prevent imminent or life-threatening deterioration.  Critical care was time spent personally by me on the following activities: development of treatment plan with patient and/or surrogate as well as nursing, discussions with consultants, evaluation of patient's response to treatment, examination of patient, obtaining history from patient or surrogate, ordering and performing treatments and interventions, ordering and review of laboratory studies, ordering and review of radiographic studies, pulse oximetry and re-evaluation of patient's condition.  Loralie Champagne 04/02/2021 9:30 AM

## 2021-04-02 NOTE — Progress Notes (Signed)
Patient ID: David Holness., male   DOB: Oct 27, 1965, 55 y.o.   MRN: [id number] S: Pt developed AMS yesterday and sent for CT angio of head withtout evidence of occlusion of major vessels.  He also became profoundly hypotensive requiring escalation of pressors.  Now very agitated. O:BP (!) 114/50   Pulse (!) 112   Temp (!) 102.3 F (39.1 C) (Oral)   Resp (!) 34   Ht '6\' 2"'  (1.88 m)   Wt 75.7 kg   SpO2 100%   BMI 21.43 kg/m   Intake/Output Summary (Last 24 hours) at 04/02/2021 0938 Last data filed at 04/02/2021 0830 Gross per 24 hour  Intake 2154.61 ml  Output 1555 ml  Net 599.61 ml   Intake/Output: I/O last 3 completed shifts: In: 2333.7 [I.V.:1925.5; IV Piggyback:408.1] Out: 6415 [Emesis/NG output:1275; Chest Tube:310]  Intake/Output this shift:  Total I/O In: 297 [I.V.:254.9; IV Piggyback:42] Out: 30 [Urine:30] Weight change: -2.6 kg Gen: intubated and agitated AXE:NMMHW Resp: occ rhonchi Abd:+BS, soft Ext: trace pedal edema  Recent Labs  Lab 03/27/21 0509 03/27/21 1614 03/28/21 0405 03/28/21 1620 03/29/21 0325 03/29/21 1533 03/30/21 0414 03/31/21 0356 04/01/21 0325 04/01/21 1328 04/01/21 1526 04/01/21 1801 04/01/21 2010 04/01/21 2212 04/02/21 0344 04/02/21 0352  NA 135   < > 134* 132* 134* 133* 133* 132* 129* 127* 129* 131* 132* 128* 129* 130*  K 4.6   < > 4.7 5.2* 5.0 4.8 4.6 4.4 4.6 5.2* 4.1 3.6 3.6 3.5 4.3 4.3  CL 100   < > 103 99 101 99 102 99 95* 95*  --   --   --  98 97*  --   CO2 25   < > '26 26 25 23 25 22 22 ' 21*  --   --   --  18* 16*  --   GLUCOSE 153*   < > 165* 174* 132* 209* 117* 170* 175* 341*  --   --   --  72 102*  --   BUN 14   < > '17 18 17 18 20 ' 29* 37* 43*  --   --   --  45* 50*  --   CREATININE 1.74*   < > 1.60* 1.48* 1.53* 1.61* 1.73* 2.63* 3.56* 4.19*  --   --   --  4.33* 4.60*  --   ALBUMIN 3.0*   < > 2.6* 3.0* 2.9* 2.8* 2.6* 2.5* 2.5*  --   --   --   --   --  2.2*  --   CALCIUM 8.0*   < > 7.7* 7.4* 7.7* 7.7* 7.8* 7.4* 7.2* 7.4*  --    --   --  7.2* 7.4*  --   PHOS  --    < > 1.9* 2.8 2.2* 5.4* 2.6 3.0 4.2  --   --   --   --   --  3.3  --   AST 26  --  31  --  38  --  40 88* 56*  --   --   --   --   --  44*  --   ALT 6  --  10  --  13  --  '15 30 28  ' --   --   --   --   --  23  --    < > = values in this interval not displayed.   Liver Function Tests: Recent Labs  Lab 03/31/21 0356 04/01/21 0325 04/02/21 0344  AST 88*  56* 44*  ALT '30 28 23  ' ALKPHOS 259* 249* 205*  BILITOT 2.4* 1.8* 1.8*  PROT 5.8* 6.0* 5.6*  ALBUMIN 2.5* 2.5* 2.2*   No results for input(s): LIPASE, AMYLASE in the last 168 hours. No results for input(s): AMMONIA in the last 168 hours. CBC: Recent Labs  Lab 03/30/21 0414 03/31/21 0356 04/01/21 0325 04/01/21 1328 04/01/21 1526 04/01/21 2010 04/02/21 0344 04/02/21 0352  WBC 13.7* 19.6* 25.9* 28.1*  --   --  87.8*  --   NEUTROABS  --   --   --  21.1*  --   --   --   --   HGB 9.8* 9.9* 10.4* 9.9*   < > 13.3 9.8* 12.2*  HCT 31.1* 31.7* 33.6* 33.2*   < > 39.0 30.3* 36.0*  MCV 96.0 96.6 98.0 100.0  --   --  95.3  --   PLT 151 204 300 326  --   --  272  --    < > = values in this interval not displayed.   Cardiac Enzymes: No results for input(s): CKTOTAL, CKMB, CKMBINDEX, TROPONINI in the last 168 hours. CBG: Recent Labs  Lab 04/01/21 2005 04/01/21 2037 04/01/21 2353 04/02/21 0323 04/02/21 0727  GLUCAP 42* 78 72 85 104*    Iron Studies: No results for input(s): IRON, TIBC, TRANSFERRIN, FERRITIN in the last 72 hours. Studies/Results: DG Abd 1 View  Result Date: 04/02/2021 CLINICAL DATA:  MRI clearance EXAM: ABDOMEN - 1 VIEW COMPARISON:  None. FINDINGS: Normal abdominal gas pattern. Nasogastric tube seen in the expected distal body of the stomach. No unexpected metallic foreign body noted within the visualized abdomen. IMPRESSION: No unexpected metallic foreign body within the abdomen. Electronically Signed   By: Fidela Salisbury MD   On: 04/02/2021 00:36   CT Angio Chest Pulmonary  Embolism (PE) W or WO Contrast  Result Date: 04/01/2021 CLINICAL DATA:  Chest pain, shortness of breath, worsening shock, altered mental status chest EXAM: CT ANGIOGRAPHY CHEST WITH CONTRAST TECHNIQUE: Multidetector CT imaging of the chest was performed using the standard protocol during bolus administration of intravenous contrast. Multiplanar CT image reconstructions and MIPs were obtained to evaluate the vascular anatomy. CONTRAST:  173m OMNIPAQUE IOHEXOL 350 MG/ML SOLN IV COMPARISON:  CT chest 03/08/2021 FINDINGS: Cardiovascular: Atherosclerotic calcifications aorta, proximal great vessels and coronary arteries. Aorta normal caliber without aneurysm or dissection. Heart unremarkable. No pericardial effusion. Pulmonary arteries adequately opacified and patent. Lower lobe pulmonary arteries suboptimally assessed due to degree of atelectasis. No definite evidence of pulmonary embolism. Mediastinum/Nodes: Esophagus unremarkable. No thoracic adenopathy. Base of cervical region normal appearance. Stranding in anterior mediastinum consistent with recent median sternotomy. Lungs/Pleura: BILATERAL pleural effusions. RIGHT thoracostomy tube. Extensive atelectasis versus consolidation of lower lobes. Patchy airspace infiltrates in the upper lobes bilaterally and RIGHT middle lobe, question edema versus infection. No pneumothorax. Upper Abdomen: Small amount of perihepatic free fluid. Extensive atherosclerotic calcifications. Musculoskeletal: No acute osseous findings. Review of the MIP images confirms the above findings. IMPRESSION: No definite evidence of pulmonary embolism. Moderate-sized BILATERAL pleural effusions and significant atelectasis versus consolidation of lower lobes. Mild patchy infiltrates in remaining lungs which may represent edema or infection. Stranding in anterior mediastinum consistent with recent median sternotomy. Aortic Atherosclerosis (ICD10-I70.0). Electronically Signed   By: MLavonia DanaM.D.    On: 04/01/2021 15:14   DG CHEST PORT 1 VIEW  Result Date: 04/02/2021 CLINICAL DATA:  Hypoxia EXAM: PORTABLE CHEST 1 VIEW COMPARISON:  April 01, 2021 FINDINGS: Endotracheal  tube tip is 3.8 cm above the carina. Nasogastric tube tip and side port are below the diaphragm. Central catheter tip in superior vena cava. No pneumothorax. There is a left pleural effusion with atelectasis in the left lower lung region. There is been significant interval clearing on the right with right lung essentially clear. Heart size and pulmonary vascularity are normal. Patient is status post median sternotomy. No adenopathy. No bone lesions. IMPRESSION: Tube and catheter positions as described without pneumothorax. Right lung now clear. There is a left pleural effusion with left base atelectasis. Stable cardiac silhouette. Electronically Signed   By: Lowella Grip III M.D.   On: 04/02/2021 08:03   DG CHEST PORT 1 VIEW  Result Date: 04/01/2021 CLINICAL DATA:  Intubation EXAM: PORTABLE CHEST 1 VIEW COMPARISON:  Portable exam 1445 hours compared to 04/01/2021 FINDINGS: Tip of endotracheal tube projects 5.3 cm above carina. Nasogastric tube extends into stomach. RIGHT jugular line with tip projecting over confluence of SVC. External pacing leads present. Normal heart size post median sternotomy. Bibasilar effusions and atelectasis greater on RIGHT. No pneumothorax or segmental infiltrate. IMPRESSION: Bibasilar pleural effusions and atelectasis greater on RIGHT. Electronically Signed   By: Lavonia Dana M.D.   On: 04/01/2021 14:55   DG Chest Port 1 View  Result Date: 04/01/2021 CLINICAL DATA:  Chest tube.  Open-heart surgery. EXAM: PORTABLE CHEST 1 VIEW COMPARISON:  03/31/2021. FINDINGS: Right IJ sheath in stable position. Right chest tube appears to be in stable position. Lower portion of the tube not imaged. Prior CABG. Cardiomegaly. Progressive diffuse bilateral pulmonary infiltrates/edema. Progressive small bilateral pleural  effusions. No pneumothorax. IMPRESSION: 1. Right IJ sheath in stable position. Right chest tube appears to be in stable position. Lower portion of the tube not imaged. No pneumothorax. 2. Prior CABG. Cardiomegaly. Progressive diffuse bilateral pulmonary infiltrates/edema. Progressive small bilateral pleural effusions. Findings consistent with CHF. Electronically Signed   By: Marcello Moores  Register   On: 04/01/2021 08:02   EEG adult  Result Date: 04/01/2021 Lora Havens, MD     04/01/2021  6:01 PM Patient Name: [name]. MRN: [id number] Epilepsy Attending: Lora Havens Referring Physician/Provider: Dr Noemi Chapel Date: 04/01/2021 Duration: 27.16 mins  Patient history: 55yo M s/p cardiac arrest, ws improving but had an episode of sudden onset unresponsiveness. EEG to evaluate for seizure  Level of alertness: awake  AEDs during EEG study: None  Technical aspects: This EEG study was done with scalp electrodes positioned according to the 10-20 International system of electrode placement. Electrical activity was acquired at a sampling rate of '500Hz'  and reviewed with a high frequency filter of '70Hz'  and a low frequency filter of '1Hz' . EEG data were recorded continuously and digitally stored.  Description: EEG showed continuous generalized 5 to 6 Hz theta as well as intermittent 2-'3Hz'  delta slowing. Hyperventilation and photic stimulation were not performed.     ABNORMALITY - Continuous slow, generalized  IMPRESSION: This study is suggestive of moderate diffuse encephalopathy, nonspecific etiology. No seizures or epileptiform discharges were seen throughout the recording.    Lora Havens    ECHOCARDIOGRAM COMPLETE  Result Date: 04/01/2021    ECHOCARDIOGRAM REPORT   Patient Name:   David Rose. Date of Exam: 04/01/2021 Medical Rec #:  [id number]           Height:       74.0 in Accession #:    [id number]          Weight:  172.6 lb Date of Birth:  03-13-1966            BSA:          2.042 m Patient Age:     35 years            BP:           90/50 mmHg Patient Gender: M                   HR:           97 bpm. Exam Location:  Inpatient Procedure: 2D Echo, Color Doppler and Cardiac Doppler Indications:    CHF  History:        Patient has prior history of Echocardiogram examinations.  Sonographer:    NA Referring Phys: Benedict  1. Left ventricular ejection fraction, by estimation, is 45 to 50%. The left ventricle has mildly decreased function. The left ventricle demonstrates regional wall motion abnormalities with basal to mid inferolateral severe hypokinesis. There is mild left ventricular hypertrophy. Left ventricular diastolic parameters are indeterminate due to atrial flutter.  2. Right ventricular systolic function is moderately reduced. The right ventricular size is mildly enlarged. Mildly increased right ventricular wall thickness.  3. Left atrial size was moderately dilated.  4. Right atrial size was mildly dilated.  5. The mitral valve is normal in structure. No evidence of mitral valve regurgitation. No evidence of mitral stenosis.  6. The aortic valve is tricuspid. Aortic valve regurgitation is not visualized. Mild aortic valve sclerosis is present, with no evidence of aortic valve stenosis.  7. The inferior vena cava is dilated in size with <50% respiratory variability, suggesting right atrial pressure of 15 mmHg. FINDINGS  Left Ventricle: Left ventricular ejection fraction, by estimation, is 45 to 50%. The left ventricle has mildly decreased function. The left ventricle demonstrates regional wall motion abnormalities. The left ventricular internal cavity size was normal in size. There is mild left ventricular hypertrophy. Left ventricular diastolic parameters are indeterminate. Right Ventricle: The right ventricular size is mildly enlarged. Mildly increased right ventricular wall thickness. Right ventricular systolic function is moderately reduced. Left Atrium: Left atrial size was  moderately dilated. Right Atrium: Right atrial size was mildly dilated. Pericardium: Trivial pericardial effusion is present. Mitral Valve: The mitral valve is normal in structure. There is mild calcification of the mitral valve leaflet(s). Mild mitral annular calcification. No evidence of mitral valve regurgitation. No evidence of mitral valve stenosis. Tricuspid Valve: The tricuspid valve is normal in structure. Tricuspid valve regurgitation is trivial. Aortic Valve: The aortic valve is tricuspid. Aortic valve regurgitation is not visualized. Mild aortic valve sclerosis is present, with no evidence of aortic valve stenosis. Aortic valve mean gradient measures 5.0 mmHg. Aortic valve peak gradient measures 8.4 mmHg. Aortic valve area, by VTI measures 2.41 cm. Pulmonic Valve: The pulmonic valve was normal in structure. Pulmonic valve regurgitation is not visualized. Aorta: The aortic root is normal in size and structure. Venous: The inferior vena cava is dilated in size with less than 50% respiratory variability, suggesting right atrial pressure of 15 mmHg. IAS/Shunts: No atrial level shunt detected by color flow Doppler.  LEFT VENTRICLE PLAX 2D LVIDd:         4.10 cm  Diastology LVIDs:         3.80 cm  LV e' medial:    8.16 cm/s LV PW:         0.70 cm  LV E/e' medial:  14.0 LV IVS:        1.30 cm  LV e' lateral:   7.51 cm/s LVOT diam:     2.10 cm  LV E/e' lateral: 15.2 LV SV:         53 LV SV Index:   26 LVOT Area:     3.46 cm  RIGHT VENTRICLE            IVC RV Basal diam:  3.60 cm    IVC diam: 2.70 cm RV Mid diam:    3.50 cm RV S prime:     6.05 cm/s TAPSE (M-mode): 1.0 cm LEFT ATRIUM             Index       RIGHT ATRIUM           Index LA diam:        2.90 cm 1.42 cm/m  RA Area:     28.70 cm LA Vol (A2C):   83.0 ml 40.65 ml/m RA Volume:   105.00 ml 51.43 ml/m LA Vol (A4C):   53.2 ml 26.06 ml/m LA Biplane Vol: 66.5 ml 32.57 ml/m  AORTIC VALVE AV Area (Vmax):    2.41 cm AV Area (Vmean):   2.10 cm AV Area  (VTI):     2.41 cm AV Vmax:           145.00 cm/s AV Vmean:          100.000 cm/s AV VTI:            0.218 m AV Peak Grad:      8.4 mmHg AV Mean Grad:      5.0 mmHg LVOT Vmax:         101.00 cm/s LVOT Vmean:        60.700 cm/s LVOT VTI:          0.152 m LVOT/AV VTI ratio: 0.70  AORTA Ao Root diam: 3.00 cm Ao Asc diam:  2.80 cm MITRAL VALVE                TRICUSPID VALVE MV Area (PHT): 4.54 cm     TR Peak grad:   45.4 mmHg MV Decel Time: 167 msec     TR Vmax:        337.00 cm/s MV E velocity: 114.00 cm/s                             SHUNTS                             Systemic VTI:  0.15 m                             Systemic Diam: 2.10 cm Loralie Champagne MD Electronically signed by Loralie Champagne MD Signature Date/Time: 04/01/2021/2:37:18 PM    Final    CT HEAD CODE STROKE WO CONTRAST  Result Date: 04/01/2021 CLINICAL DATA:  Code stroke.  Neuro deficit, acute stroke suspected. EXAM: CT HEAD WITHOUT CONTRAST TECHNIQUE: Contiguous axial images were obtained from the base of the skull through the vertex without intravenous contrast. COMPARISON:  None. FINDINGS: Brain: No evidence of acute large vascular territory infarction, hemorrhage, hydrocephalus, extra-axial collection or mass lesion/mass effect. Small remote appearing lacunar infarcts in bilateral frontal lobe white matter. Evaluation of posterior fossa is limited by streak artifact. Vascular: No  hyperdense vessel identified. Skull: No acute fracture. Sinuses/Orbits: Opacified anterior right ethmoid air cell with mild mucosal thickening of the ethmoid air cells. Otherwise clear sinuses. Unremarkable orbits. Other: No mastoid effusions. ASPECTS [facility] Stroke Program Early CT Score) total score (0-10 with 10 being normal): 10. IMPRESSION: 1. No evidence of acute large vascular territory infarct or acute hemorrhage. ASPECTS is 10. 2. Small remote appearing lacunar infarcts in bilateral frontal lobe white matter. An MRI could provide more sensitive evaluation for  acute infarct if clinically indicated. Code stroke imaging results were communicated on 04/01/2021 at 2:06 pm to provider Dr. Quinn Axe via secure text paging. Electronically Signed   By: Margaretha Sheffield MD   On: 04/01/2021 14:07   [place] EKG SITE RITE  Result Date: 04/02/2021 If Site Rite image not attached, placement could not be confirmed due to current cardiac rhythm.  CT ANGIO HEAD CODE STROKE  Result Date: 04/01/2021 CLINICAL DATA:  Neuro deficit, acute stroke suspected. EXAM: CT ANGIOGRAPHY HEAD AND NECK TECHNIQUE: Multidetector CT imaging of the head and neck was performed using the standard protocol during bolus administration of intravenous contrast. Multiplanar CT image reconstructions and MIPs were obtained to evaluate the vascular anatomy. Carotid stenosis measurements (when applicable) are obtained utilizing NASCET criteria, using the distal internal carotid diameter as the denominator. CONTRAST:  77m OMNIPAQUE IOHEXOL 350 MG/ML SOLN COMPARISON:  None. FINDINGS: CTA NECK FINDINGS Aortic arch: Calcific atherosclerosis of the aorta. Great vessel origins are patent. Right carotid system: Predominately calcific atherosclerosis at the carotid bifurcation without greater than 50% narrowing. The internal carotid artery is small throughout its course without focal hemodynamically significant stenosis. Left carotid system: Predominately calcific atherosclerosis at the carotid bifurcation without greater than 50% narrowing. The internal carotid artery is small throughout its course without focal hemodynamically significant stenosis. Vertebral arteries: Severe stenosis of the right vertebral artery origin secondary to atherosclerosis. Skeleton: No evidence of acute abnormality on limited assessment. Other neck: Mild diffuse soft tissue swelling. Upper chest: Please see concurrent CT chest for intrathoracic findings. Review of the MIP images confirms the above findings CTA HEAD FINDINGS Anterior circulation:  Bilateral calcific and paraclinoid ICA calcific atherosclerosis without evidence of flow limiting stenosis. Bilateral MCAs and ACAs are patent without evidence of proximal hemodynamically significant stenosis. No aneurysm. Posterior circulation: Small left vertebral artery. Moderate left and mild right narrowing of the intradural vertebral arteries proximally due to calcific atherosclerosis. The basilar artery and bilateral posterior cerebral arteries are patent without proximal hemodynamically significant stenosis. No aneurysm. Venous sinuses: As permitted by contrast timing, patent. Review of the MIP images confirms the above findings IMPRESSION: CTA Head: 1. No large vessel occlusion. 2. 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 CTA Neck: 1. Severe atherosclerotic narrowing of the right vertebral artery origin. 2. Bilateral carotid bifurcation atherosclerosis without greater than 50% narrowing. 3. Small internal carotid arteries in the neck, nonspecific but potentially chronic/congenital. 4. Mild diffuse subcutaneous soft tissue swelling, suggestive of anasarca. 5. Please see concurrent CT chest for intrathoracic findings. Findings discussed with Dr. SFuller Planat 2:28 p.m. via telephone. Electronically Signed   By: FMargaretha SheffieldMD   On: 04/01/2021 14:40   CT ANGIO NECK CODE STROKE  Result Date: 04/01/2021 CLINICAL DATA:  Neuro deficit, acute stroke suspected. EXAM: CT ANGIOGRAPHY HEAD AND NECK TECHNIQUE: Multidetector CT imaging of the head and neck was performed using the standard protocol during bolus administration of intravenous contrast. Multiplanar CT image reconstructions and MIPs were obtained to evaluate the  vascular anatomy. Carotid stenosis measurements (when applicable) are obtained utilizing NASCET criteria, using the distal internal carotid diameter as the denominator. CONTRAST:  72m OMNIPAQUE IOHEXOL 350 MG/ML SOLN COMPARISON:  None. FINDINGS: CTA NECK FINDINGS Aortic  arch: Calcific atherosclerosis of the aorta. Great vessel origins are patent. Right carotid system: Predominately calcific atherosclerosis at the carotid bifurcation without greater than 50% narrowing. The internal carotid artery is small throughout its course without focal hemodynamically significant stenosis. Left carotid system: Predominately calcific atherosclerosis at the carotid bifurcation without greater than 50% narrowing. The internal carotid artery is small throughout its course without focal hemodynamically significant stenosis. Vertebral arteries: Severe stenosis of the right vertebral artery origin secondary to atherosclerosis. Skeleton: No evidence of acute abnormality on limited assessment. Other neck: Mild diffuse soft tissue swelling. Upper chest: Please see concurrent CT chest for intrathoracic findings. Review of the MIP images confirms the above findings CTA HEAD FINDINGS Anterior circulation: Bilateral calcific and paraclinoid ICA calcific atherosclerosis without evidence of flow limiting stenosis. Bilateral MCAs and ACAs are patent without evidence of proximal hemodynamically significant stenosis. No aneurysm. Posterior circulation: Small left vertebral artery. Moderate left and mild right narrowing of the intradural vertebral arteries proximally due to calcific atherosclerosis. The basilar artery and bilateral posterior cerebral arteries are patent without proximal hemodynamically significant stenosis. No aneurysm. Venous sinuses: As permitted by contrast timing, patent. Review of the MIP images confirms the above findings IMPRESSION: CTA Head: 1. No large vessel occlusion. 2. 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 CTA Neck: 1. Severe atherosclerotic narrowing of the right vertebral artery origin. 2. Bilateral carotid bifurcation atherosclerosis without greater than 50% narrowing. 3. Small internal carotid arteries in the neck, nonspecific but potentially  chronic/congenital. 4. Mild diffuse subcutaneous soft tissue swelling, suggestive of anasarca. 5. Please see concurrent CT chest for intrathoracic findings. Findings discussed with Dr. SFuller Planat 2:28 p.m. via telephone. Electronically Signed   By: FMargaretha SheffieldMD   On: 04/01/2021 14:40    aspirin  81 mg Per Tube Daily   atorvastatin  40 mg Per Tube Daily   bisacodyl  10 mg Oral Daily   Or   bisacodyl  10 mg Rectal Daily   chlorhexidine gluconate (MEDLINE KIT)  15 mL Mouth Rinse BID   Chlorhexidine Gluconate Cloth  6 each Topical Daily   darbepoetin (ARANESP) injection - NON-DIALYSIS  60 mcg Subcutaneous Q Thu-1800   docusate  200 mg Per Tube Daily   insulin aspart  0-24 Units Subcutaneous Q4H   insulin glargine  10 Units Subcutaneous Daily   ipratropium-albuterol  3 mL Nebulization Q4H   mouth rinse  15 mL Mouth Rinse 10 times per day   multivitamin  1 tablet Per Tube QHS   pantoprazole (PROTONIX) IV  40 mg Intravenous Q24H   [START ON 04/03/2021] polyethylene glycol  17 g Per Tube Daily   sodium chloride flush  10 mL Intracatheter Q8H   sodium chloride flush  10-40 mL Intracatheter Q12H   sodium chloride flush  3 mL Intravenous Q12H   vancomycin variable dose per unstable renal function (pharmacist dosing)   Does not apply See admin instructions    BMET    Component Value Date/Time   NA 130 (L) 04/02/2021 0352   K 4.3 04/02/2021 0352   CL 97 (L) 04/02/2021 0344   CO2 16 (L) 04/02/2021 0344   GLUCOSE 102 (H) 04/02/2021 0344   BUN 50 (H) 04/02/2021 0344   CREATININE 4.60 (H) 04/02/2021 0344  CALCIUM 7.4 (L) 04/02/2021 0344   GFRNONAA 14 (L) 04/02/2021 0344   CBC    Component Value Date/Time   WBC 87.8 (HH) 04/02/2021 0344   RBC 3.18 (L) 04/02/2021 0344   HGB 12.2 (L) 04/02/2021 0352   HCT 36.0 (L) 04/02/2021 0352   PLT 272 04/02/2021 0344   MCV 95.3 04/02/2021 0344   MCH 30.8 04/02/2021 0344   MCHC 32.3 04/02/2021 0344   RDW 20.0 (H) 04/02/2021 0344   LYMPHSABS 2.4  04/01/2021 1328   MONOABS 1.9 (H) 04/01/2021 1328   EOSABS 0.6 (H) 04/01/2021 1328   BASOSABS 0.1 04/01/2021 1328    Outpatient dialysis prescription: [facility] [phone] 4 Hours, 180 dialyzer, 2K/2.5 Ca, DFR 500, BFR 400 Use AVF; graft on inner arm, 15 guage Heparin loading dose 2000 and hourly dose of 1200 EDW 78.5 kg Last post weight 78 kg on 5/21 (went in at 78 and came out at 78 kg) Meds: epogen 2400 units each tx venofer 50 mg weekly Not on hectorol or calcitriol sensipar 30 mg three times a week with HD   Assessment/Plan:   Severe sepsis syndrome - on broad spectrum abx.  ID consulted.  Now on levophed, neo, and vasopressin per PCCM. ESRD - [place], last HD 5/30, now on CRRT since 04/18/2021 following CABG complicated by PEA arrest and need for pressors.  Still remains on pressors however he is feeling better.   CRRT stopped 03/30/21 Plan for IHD 04/01/21 but unable to perform due to acute decompensation and profound hypotension.   Will plan to resume CRRT today   femoral HD catheter removed 03/30/21 and will need new HD catheter placed by PCCM today. CAD s/p CABG x 4 complicated by PEA arrest.  Currently stable Acute on chronic CHF with cardiogenic shock post CABG - currently on levophed, milrinone, and started on midodrine.  Ok to increase midodrine to 15 mg tid and wean levophed as tolerated. Repeat ECHO EF 45-50%, RV function moderately reduced. Pulmonary HTN - per HF team Cirrhosis - presumably cardiogenic.  S/p paracentesis on 03/18/21. 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  Hypophosphatemia - repleted with IV phos and follow.   Donetta Potts, MD [employer] [phone]

## 2021-04-02 NOTE — Progress Notes (Signed)
Inpatient Diabetes Program Recommendations  AACE/ADA: New Consensus Statement on Inpatient Glycemic Control (2015)  Target Ranges:  Prepandial:   less than 140 mg/dL      Peak postprandial:   less than 180 mg/dL (1-2 hours)      Critically ill patients:  140 - 180 mg/dL   Lab Results  Component Value Date   GLUCAP 104 (H) 04/02/2021   HGBA1C 6.5 (H) 02/25/2021    Review of Glycemic Control Results for David Rose, David Rose (MRN [id number]) as of 04/02/2021 10:51  Ref. Range 04/01/2021 20:05 04/01/2021 20:37 04/01/2021 23:53 04/02/2021 03:23 04/02/2021 07:27  Glucose-Capillary Latest Ref Range: 70 - 99 mg/dL 42 (LL) 78 72 85 104 (H)   Diabetes history: Type 2 DM Outpatient Diabetes medications: none Current orders for Inpatient glycemic control: Novolog 0-24 units Q4H, Lantus 10 units QD To start tube feeds  Inpatient Diabetes Program Recommendations:    Noted hypoglycemia of 42 mg/dL following correction with nutritional supplements yesterday. Since, patient now intubated with plans to start tube feeds and CRRT.   At this time, consider holding Lantus dose.   Thanks, Bronson Curb, MSN, RNC-OB Diabetes Coordinator [phone] (8a-5p)

## 2021-04-02 NOTE — Progress Notes (Signed)
Placed bite block

## 2021-04-02 NOTE — Progress Notes (Signed)
CSW attempted to visit the patient at bedside to introduce self as the heart failure social worker and to complete a very brief SDOH screening with the patient to address social needs as needed however the patient was unresponsive due to vent and unable to engage in conversation at this time.   CSW will continue to follow for discharge needs and will check back with the patient at another time.  Onofre Gains, MSW, [place] Heart Failure Social Worker

## 2021-04-02 NOTE — Procedures (Signed)
Central Venous Catheter Insertion (Temporary HD catheter) Procedure Note  David Rose  [id number]  [birth year]  Date:04/02/21  Time:10:57 AM   Provider Performing:Daytona Hedman E Adren Dollins   Procedure: Insertion of Non-tunneled Central Venous Catheter(36556)with US guidance [id number])    Indication(s) Hemodialysis  Consent Risks of the procedure as well as the alternatives and risks of each were explained to the patient and/or caregiver.  Consent for the procedure was obtained and is signed in the bedside chart  Anesthesia 1% lidocaine, systemic fentanyl and versed as per PAD for mechanical ventilation   Timeout Verified patient identification, verified procedure, site/side was marked, verified correct patient position, special equipment/implants available, medications/allergies/relevant history reviewed, required imaging and test results available.  Sterile Technique Maximal sterile technique including full sterile barrier drape, hand hygiene, sterile gown, sterile gloves, mask, hair covering, sterile ultrasound probe cover (if used).  Procedure Description Area of catheter insertion was cleaned with chlorhexidine and draped in sterile fashion.   With real-time ultrasound guidance a 20cm HD catheter was placed into the left femoral vein.  Placement verified by 2 views via ultrasound prior to vessel dilation and catheter insertion. Nonpulsatile blood flow and easy flushing noted in all ports.  The catheter was sutured in place and sterile dressing applied.  Complications/Tolerance None; patient tolerated the procedure well.  Chest x-ray is not indicated for femoral cannulation.  EBL Minimal  Specimen(s) None   Eliseo Gum MSN, AGACNP-BC [place] for pager 04/02/2021, 10:59 AM

## 2021-04-02 NOTE — Progress Notes (Signed)
      [address]       [place] [address]             [phone]      Intubated, has been responsive  BP (!) 91/18   Pulse 100   Temp 97.9 F (36.6 C) (Oral)   Resp (!) 30   Ht '6\' 2"'$  (1.88 m)   Wt 75.7 kg   SpO2 100%   BMI 21.43 kg/m  On 36 mcg/min norepi, 0.03 vasopressin  Intake/Output Summary (Last 24 hours) at 04/02/2021 1813 Last data filed at 04/02/2021 1800 Gross per 24 hour  Intake 3664.99 ml  Output 1886 ml  Net 1778.99 ml   Remains critically ill with multiple organ system failure and sepsis  For Abdominal CT this PM On Vanco, Maxipime, Flagyl  Prognosis guarded  Remo Lipps C. Roxan Hockey, MD [facility] [phone]

## 2021-04-02 NOTE — Progress Notes (Signed)
RT NOTE:  Pt transported on vent to CT and back to 2H without event.

## 2021-04-02 NOTE — Progress Notes (Signed)
PT Cancellation Note  Patient Details Name: David Rose. MRN: [id number] DOB: 09/16/1966   Cancelled Treatment:    Reason Eval/Treat Not Completed: Patient not medically ready (pt currently intubated at 80%FiO2 and not medically appropriate)   Elyan Vanwieren B Charron Coultas 04/02/2021, 6:52 AM Bayard Males, PT Acute Rehabilitation Services Pager: [phone] Office: [phone]

## 2021-04-02 NOTE — Progress Notes (Signed)
9 Days Post-Op Procedure(s) (LRB): EXPLORATION POST OPERATIVE OPEN HEART (N/A) Subjective: sedated  Objective: Vital signs in last 24 hours: Temp:  [98.7 F (37.1 C)-102.3 F (39.1 C)] 102.3 F (39.1 C) (06/10 0731) Pulse Rate:  [44-139] 110 (06/10 0700) Cardiac Rhythm: Sinus tachycardia;Atrial fibrillation (06/10 0400) Resp:  [15-34] 34 (06/10 0700) BP: (63-142)/(42-73) 107/51 (06/10 0700) SpO2:  [82 %-100 %] 100 % (06/10 0700) FiO2 (%):  [80 %-100 %] 80 % (06/10 0351) Weight:  [75.7 kg] 75.7 kg (06/10 0452)  Hemodynamic parameters for last 24 hours:    Intake/Output from previous day: 06/09 0701 - 06/10 0700 In: 1962.7 [I.V.:1554.6; IV Piggyback:408.1] Out: 1525 [Emesis/NG output:1275; Chest Tube:250] Intake/Output this shift: No intake/output data recorded.  General appearance: sedated Neurologic: follows commands by report Heart: tachy Lungs: rales bibasilar Abdomen: soft, non-tender; bowel sounds normal; no masses,  no organomegaly Extremities: extremities normal, atraumatic, no cyanosis or edema Wound: c/d/i  Lab Results: Recent Labs    04/01/21 1328 04/01/21 1526 04/02/21 0344 04/02/21 0352  WBC 28.1*  --  87.8*  --   HGB 9.9*   < > 9.8* 12.2*  HCT 33.2*   < > 30.3* 36.0*  PLT 326  --  272  --    < > = values in this interval not displayed.   BMET:  Recent Labs    04/01/21 2212 04/02/21 0344 04/02/21 0352  NA 128* 129* 130*  K 3.5 4.3 4.3  CL 98 97*  --   CO2 18* 16*  --   GLUCOSE 72 102*  --   BUN 45* 50*  --   CREATININE 4.33* 4.60*  --   CALCIUM 7.2* 7.4*  --     PT/INR:  Recent Labs    04/01/21 1328  LABPROT 16.2*  INR 1.3*   ABG    Component Value Date/Time   PHART 7.237 (L) 04/02/2021 0352   HCO3 16.9 (L) 04/02/2021 0352   TCO2 18 (L) 04/02/2021 0352   ACIDBASEDEF 10.0 (H) 04/02/2021 0352   O2SAT 96.0 04/02/2021 0352   CBG (last 3)  Recent Labs    04/01/21 2037 04/01/21 2353 04/02/21 0323  GLUCAP 78 72 85     Assessment/Plan: S/P Procedure(s) (LRB): EXPLORATION POST OPERATIVE OPEN HEART (N/A) Suggest CVVHD via new groin line Search for septic source; needs right IJ out along with any other old invasive lines   LOS: 18 days    David Rose 04/02/2021

## 2021-04-02 NOTE — Progress Notes (Signed)
[facility] Progress Note Patient Name: David Rose. DOB: 28-Oct-1965 MRN: [id number]   Date of Service  04/02/2021  HPI/Events of Note  Patient is hypotensive with a disproportionately low diastolic pressure, echocardiogram from yesterday is without evidence of  aortic regurgitation, so it seems that the extremely low diastolic pressure, taken together with the marked jump in wbc to 88 K, and temp spike to 102, reflects severe sepsis vasoplegia. His EF on the echo from yesterday shows an EF of 45-50 % .  eICU Interventions  Will place the patient back on a modest gtt of Phenylephrine to try to address the vasoplegia  and increased systolic / diastolic BP as well as MAP.        Frederik Pear 04/02/2021, 8:33 PM

## 2021-04-02 NOTE — Consult Note (Addendum)
[facility] for Infectious Disease    Date of Admission:  03/07/2021     Total days of antibiotics 6               Reason for Consult: Leukocytosis / Sepsis  Referring Provider: Orvan Seen  Primary Care Provider: Ollen Bowl, MD   ASSESSMENT:  David Rose is a 55 y/o caucasian gentleman with CAD s/p CABG x4 complicated by cardiogenic and hemorrhagic shock and acute unresponsiveness with concern for acute CVA or possible sepsis now intubated and sedated on requiring multiple vasopressors and continued ventilatory support. Currently on broad spectrum coverage with vancomycin and cefepime (cefepime started on 6/2) with most recent x-ray without evidence of pneumonia. Unclear fever and leukocytosis. Agree with line exchange and awaiting blood cultures to determine presence of bacteremia, although has been on cefepime which will likely limited any yields. Given concern for possible aspiration during intubation will narrow antibiotics to Unasyn for now with little concern for Pseudomonas infection and MRSA PCR previously negative. Continue with ventilatory, pressor and renal support. Will await cultures and narrow antibiotics as needed. Currently the source of infection is unclear.   PLAN:  Narrow antibiotics to Unasyn. Agree with line exchange. Monitor blood cultures once drawn. Continue ventilatory, pressor and renal support.    Active Problems:   Acute on chronic systolic heart failure (HCC)   S/P CABG x 4   Protein-calorie malnutrition, severe   Acute respiratory failure with hypoxia (HCC)   Postoperative hypovolemic shock   Chest tube in place    aspirin  81 mg Per Tube Daily   atorvastatin  40 mg Per Tube Daily   bisacodyl  10 mg Oral Daily   Or   bisacodyl  10 mg Rectal Daily   chlorhexidine gluconate (MEDLINE KIT)  15 mL Mouth Rinse BID   Chlorhexidine Gluconate Cloth  6 each Topical Daily   darbepoetin (ARANESP) injection - NON-DIALYSIS  60 mcg Subcutaneous Q  Thu-1800   docusate  200 mg Per Tube Daily   insulin aspart  0-24 Units Subcutaneous Q4H   insulin glargine  10 Units Subcutaneous Daily   ipratropium-albuterol  3 mL Nebulization Q4H   mouth rinse  15 mL Mouth Rinse 10 times per day   multivitamin  1 tablet Per Tube QHS   pantoprazole (PROTONIX) IV  40 mg Intravenous Q24H   [START ON 04/03/2021] polyethylene glycol  17 g Per Tube Daily   sodium chloride flush  10 mL Intracatheter Q8H   sodium chloride flush  10-40 mL Intracatheter Q12H   sodium chloride flush  3 mL Intravenous Q12H   vancomycin variable dose per unstable renal function (pharmacist dosing)   Does not apply See admin instructions     HPI: David Rose. is a 55 y.o. male with previous medical history of Type 2 diabetes, hypertension, peripheral artery disease, chronic systolic heart failure, ESRD on HD (T, Th, Sat) and cirrhosis admitted on 5/23 for additional evaluation following outpatient cardiac catheterization showed mutlivessel coronary artery disease and biventricular dysfunction.   David Rose went to the OR on 5/31 for CABG x4. CVTS was monitoring bleeding on the evening of surgery and he was noted to a hemoglobin of 6.4 and transfused 6 units of PRBC and Factor 7. Extubated and course was complicated by development of PEA arrest and then Vfib. ROSC was achieved after 8 minutes. Had 1 L of blood output in the pleurovac. Taken to the OR and noted to  have a large amount of clot in the anterior mediastinum and around the heart. Bleeding was noted from the distal aspect of the LAD anastomosis. EEG performed to evaluate for potential seizure with no significant findings. Mixed cardiogenic and hemorrhagic shock improved. Extubated on 6/2. Continued on CRRT for fluid removal.   Course continued progress until 6/9 whenhe was found to be non-responsive and pupils were fixed and dilated. Code Stroke was activated and was intubated for airway protection. There was concern for  mild gag with aspiration during intubation attempt.CT was without evidence of acute vascular territory infarct or hemorrhage. Started on vancomycin and cefepime. By 1700 that evening he was following some commands and per nursing woke up following removal of sternal wires.  Has been on max doses of norepinephrine and continued to have lower blood pressure. Phenylephrine and versed were added because of respiratory acidosis and little room to adjust ventilator because of peak airway pressures. WBC count jumped up to 87.8. CRRT resumed today.   David Rose has been febrile with a max temperature of 102.3 and WBC count now 86.9. Has been on cefepime since 6/5 and started on vancomycin on 6/9. Previous MRSA PCR screens negative. Blood cultures are waiting to be drawn. ID has been asked to make antibiotic recommendations with concern for sepsis.    Review of Systems: Review of Systems  Unable to perform ROS: Intubated    Past Medical History:  Diagnosis Date   ESRD (end stage renal disease) ([place])    Hypertension    Peripheral arterial disease ([place])    Type 2 diabetes mellitus ([place])     Social History   Tobacco Use   Smoking status: Never   Smokeless tobacco: Never    Family History  Problem Relation Age of Onset   Thyroid cancer Mother    Leukemia Father     Allergies  Allergen Reactions   Shellfish-Derived Products Anaphylaxis   Wasp Venom Protein Anaphylaxis   Penicillins Other (See Comments)    Childhood reaction    OBJECTIVE: Blood pressure (!) 127/54, pulse (!) 112, temperature (!) 102.3 F (39.1 C), temperature source Oral, resp. rate (!) 34, height _0  (1.88 m), weight 75.7 kg, SpO2 100 %.  Physical Exam Constitutional:      General: He is not in acute distress.    Appearance: He is well-developed. He is ill-appearing.     Interventions: He is sedated and intubated.     Comments: Lying in bed with head of bed elevated; sedated.   Cardiovascular:     Rate and  Rhythm: Normal rate and regular rhythm.     Heart sounds: Normal heart sounds.  Pulmonary:     Effort: Pulmonary effort is normal. He is intubated.     Breath sounds: Normal breath sounds.  Skin:    General: Skin is warm and dry.  Neurological:     Mental Status: He is oriented to person, place, and time.  Psychiatric:        Behavior: Behavior normal.        Thought Content: Thought content normal.        Judgment: Judgment normal.    Lab Results Lab Results  Component Value Date   WBC 87.8 (HH) 04/02/2021   HGB 12.2 (L) 04/02/2021   HCT 36.0 (L) 04/02/2021   MCV 95.3 04/02/2021   PLT 272 04/02/2021    Lab Results  Component Value Date   CREATININE 4.60 (H) 04/02/2021   BUN 50 (H) 04/02/2021  NA 130 (L) 04/02/2021   K 4.3 04/02/2021   CL 97 (L) 04/02/2021   CO2 16 (L) 04/02/2021    Lab Results  Component Value Date   ALT 23 04/02/2021   AST 44 (H) 04/02/2021   ALKPHOS 205 (H) 04/02/2021   BILITOT 1.8 (H) 04/02/2021     Microbiology: Recent Results (from the past 240 hour(s))  MRSA PCR Screening     Status: None   Collection Time: 03/28/21  1:18 PM   Specimen: Nasopharyngeal  Result Value Ref Range Status   MRSA by PCR NEGATIVE NEGATIVE Final    Comment:        The GeneXpert MRSA Assay (FDA approved for NASAL specimens only), is one component of a comprehensive MRSA colonization surveillance program. It is not intended to diagnose MRSA infection nor to guide or monitor treatment for MRSA infections. Performed at [facility], [place] [address], [place], [place] [address]      Terri Piedra, [facility]ious [facility]  04/02/2021  9:00 AM

## 2021-04-03 ENCOUNTER — Inpatient Hospital Stay (HOSPITAL_COMMUNITY): Payer: Medicare Other

## 2021-04-03 DIAGNOSIS — T8119XS Other postprocedural shock, sequela: Secondary | ICD-10-CM

## 2021-04-03 DIAGNOSIS — K529 Noninfective gastroenteritis and colitis, unspecified: Secondary | ICD-10-CM

## 2021-04-03 DIAGNOSIS — Z9689 Presence of other specified functional implants: Secondary | ICD-10-CM

## 2021-04-03 DIAGNOSIS — E43 Unspecified severe protein-calorie malnutrition: Secondary | ICD-10-CM

## 2021-04-03 DIAGNOSIS — D72825 Bandemia: Secondary | ICD-10-CM

## 2021-04-03 LAB — COMPREHENSIVE METABOLIC PANEL
ALT: 19 U/L (ref 0–44)
AST: 30 U/L (ref 15–41)
Albumin: 2 g/dL — ABNORMAL LOW (ref 3.5–5.0)
Alkaline Phosphatase: 200 U/L — ABNORMAL HIGH (ref 38–126)
Anion gap: 13 (ref 5–15)
BUN: 37 mg/dL — ABNORMAL HIGH (ref 6–20)
CO2: 19 mmol/L — ABNORMAL LOW (ref 22–32)
Calcium: 6.7 mg/dL — ABNORMAL LOW (ref 8.9–10.3)
Chloride: 101 mmol/L (ref 98–111)
Creatinine, Ser: 3.18 mg/dL — ABNORMAL HIGH (ref 0.61–1.24)
GFR, Estimated: 22 mL/min — ABNORMAL LOW (ref >60)
Glucose, Bld: 139 mg/dL — ABNORMAL HIGH (ref 70–99)
Potassium: 4.1 mmol/L (ref 3.5–5.1)
Sodium: 133 mmol/L — ABNORMAL LOW (ref 135–145)
Total Bilirubin: 2.3 mg/dL — ABNORMAL HIGH (ref 0.3–1.2)
Total Protein: 5.3 g/dL — ABNORMAL LOW (ref 6.5–8.1)

## 2021-04-03 LAB — HEPARIN INDUCED PLATELET AB (HIT ANTIBODY): Heparin Induced Plt Ab: 0.145 OD (ref 0.000–0.400)

## 2021-04-03 LAB — CBC WITH DIFFERENTIAL/PLATELET
Abs Immature Granulocytes: 3.84 10*3/uL — ABNORMAL HIGH (ref 0.00–0.07)
Basophils Absolute: 0 10*3/uL (ref 0.0–0.1)
Basophils Relative: 0 %
Eosinophils Absolute: 0.2 10*3/uL (ref 0.0–0.5)
Eosinophils Relative: 0 %
HCT: 26.2 % — ABNORMAL LOW (ref 39.0–52.0)
Hemoglobin: 8.8 g/dL — ABNORMAL LOW (ref 13.0–17.0)
Immature Granulocytes: 5 %
Lymphocytes Relative: 1 %
Lymphs Abs: 1 10*3/uL (ref 0.7–4.0)
MCH: 30.7 pg (ref 26.0–34.0)
MCHC: 33.6 g/dL (ref 30.0–36.0)
MCV: 91.3 fL (ref 80.0–100.0)
Monocytes Absolute: 2.1 10*3/uL — ABNORMAL HIGH (ref 0.1–1.0)
Monocytes Relative: 3 %
Neutro Abs: 75.2 10*3/uL — ABNORMAL HIGH (ref 1.7–7.7)
Neutrophils Relative %: 91 %
Platelets: 256 10*3/uL (ref 150–400)
RBC: 2.87 MIL/uL — ABNORMAL LOW (ref 4.22–5.81)
RDW: 20.1 % — ABNORMAL HIGH (ref 11.5–15.5)
WBC: 82.3 10*3/uL (ref 4.0–10.5)
nRBC: 0 % (ref 0.0–0.2)

## 2021-04-03 LAB — RENAL FUNCTION PANEL
Albumin: 2.1 g/dL — ABNORMAL LOW (ref 3.5–5.0)
Anion gap: 11 (ref 5–15)
BUN: 26 mg/dL — ABNORMAL HIGH (ref 6–20)
CO2: 22 mmol/L (ref 22–32)
Calcium: 7.4 mg/dL — ABNORMAL LOW (ref 8.9–10.3)
Chloride: 101 mmol/L (ref 98–111)
Creatinine, Ser: 2.14 mg/dL — ABNORMAL HIGH (ref 0.61–1.24)
GFR, Estimated: 36 mL/min — ABNORMAL LOW (ref >60)
Glucose, Bld: 97 mg/dL (ref 70–99)
Phosphorus: 2.4 mg/dL — ABNORMAL LOW (ref 2.5–4.6)
Potassium: 4.7 mmol/L (ref 3.5–5.1)
Sodium: 134 mmol/L — ABNORMAL LOW (ref 135–145)

## 2021-04-03 LAB — MAGNESIUM
Magnesium: 2.1 mg/dL (ref 1.7–2.4)
Magnesium: 2.3 mg/dL (ref 1.7–2.4)

## 2021-04-03 LAB — EXPECTORATED SPUTUM ASSESSMENT W GRAM STAIN, RFLX TO RESP C

## 2021-04-03 LAB — GLUCOSE, CAPILLARY
Glucose-Capillary: 133 mg/dL — ABNORMAL HIGH (ref 70–99)
Glucose-Capillary: 122 mg/dL — ABNORMAL HIGH (ref 70–99)
Glucose-Capillary: 98 mg/dL (ref 70–99)

## 2021-04-03 LAB — HEPARIN LEVEL (UNFRACTIONATED)
Heparin Unfractionated: 0.15 IU/mL — ABNORMAL LOW (ref 0.30–0.70)
Heparin Unfractionated: 0.21 IU/mL — ABNORMAL LOW (ref 0.30–0.70)
Heparin Unfractionated: 0.1 IU/mL — ABNORMAL LOW (ref 0.30–0.70)

## 2021-04-03 LAB — URINE CULTURE: Culture: NO GROWTH

## 2021-04-03 LAB — LACTIC ACID, PLASMA
Lactic Acid, Venous: 2.6 mmol/L (ref 0.5–1.9)
Lactic Acid, Venous: 3.7 mmol/L (ref 0.5–1.9)
Lactic Acid, Venous: 3.7 mmol/L (ref 0.5–1.9)

## 2021-04-03 LAB — PHOSPHORUS
Phosphorus: 3.6 mg/dL (ref 2.5–4.6)
Phosphorus: 2.5 mg/dL (ref 2.5–4.6)

## 2021-04-03 MED ORDER — MIDODRINE HCL 5 MG PO TABS
15.0000 mg | ORAL_TABLET | Freq: Three times a day (TID) | ORAL | Status: DC
  Administered 2021-04-03 – 2021-04-09 (×18): 15 mg
  Filled 2021-04-03 (×18): qty 3

## 2021-04-03 NOTE — Progress Notes (Addendum)
ANTICOAGULATION CONSULT NOTE  Pharmacy Consult for heparin Indication: atrial flutter  Allergies  Allergen Reactions   Shellfish-Derived Products Anaphylaxis   Wasp Venom Protein Anaphylaxis   Penicillins Other (See Comments)    Childhood reaction    Patient Measurements: Height: '6\' 2"'$  (188 cm) Weight: 75.7 kg (166 lb 14.2 oz) IBW/kg (Calculated) : 82.2 Heparin Dosing Weight: 79kg  Vital Signs: Temp: 97.52 F (36.4 C) (06/11 1900) Temp Source: Esophageal (06/11 1600) BP: 98/53 (06/11 1900) Pulse Rate: 102 (06/11 1900)  Labs: Recent Labs    04/01/21 1328 04/01/21 1526 04/01/21 1529 04/01/21 1801 04/02/21 0928 04/02/21 1347 04/02/21 1713 04/02/21 2146 04/03/21 0021 04/03/21 0838 04/03/21 0923 04/03/21 1550 04/03/21 1722  HGB 9.9*   < >  --    < > 9.5*  --  9.8*  --  8.8*  --   --   --   --   HCT 33.2*   < >  --    < > 28.9*  --  29.3*  --  26.2*  --   --   --   --   PLT 326  --   --    < > 281  --  286  --  256  --   --   --   --   LABPROT 16.2*  --   --   --   --   --   --   --   --   --   --   --   --   INR 1.3*  --   --   --   --   --   --   --   --   --   --   --   --   HEPARINUNFRC  --   --   --    < >  --   --   --    < >  --  0.15* 0.21*  --  0.10*  CREATININE 4.19*  --   --    < >  --    < > 3.77*  --  3.18*  --   --  2.14*  --   TROPONINIHS 99*  --  111*  --   --   --   --   --   --   --   --   --   --    < > = values in this interval not displayed.     Estimated Creatinine Clearance: 41.8 mL/min (A) (by C-G formula based on SCr of 2.14 mg/dL (H)).   Medical History: Past Medical History:  Diagnosis Date   ESRD (end stage renal disease) ([place])    Hypertension    Peripheral arterial disease ([place])    Type 2 diabetes mellitus ([place])    Assessment: 33 YOF with recent CABG now with postop afib/flutter. HIT level was not actually sent though a result exists in chart, ok to disregard after discussion with team.   Heparin level difficult to obtain due  to limited access. 6/11 Foot stick unsuccessful, hand stick HL = 0.15 correlates to red (pre-filter) port on CRRT = 0.21. RN will draw future levels from red CRRT port.   HL down to 0.1 likely due rate drop yesterday.  No issues with infusion or bleeding per RN but noted H/H drop so will adjust conservatively.  Goal of Therapy:  Heparin level 0.3-0.5  units/ml  Monitor platelets by anticoagulation protocol: Yes   Plan:  Increase  IV heparin to 2100 units/hr Monitor daily HL, CBC/plt Monitor for signs/symptoms of bleeding     ADDENDUM 21:40 RN reporting blood tinged secretions from ET and NG tubes. OK to continue at 2100 units/hr given benefits greater than risk  and continue to monitor.    Benetta Spar, PharmD, BCPS, BCCP Clinical Pharmacist  Please check AMION for all [facility] phone numbers After 10:00 PM, call [place] [phone]

## 2021-04-03 NOTE — Progress Notes (Signed)
10 Days Post-Op Procedure(s) (LRB): EXPLORATION POST OPERATIVE OPEN HEART (N/A) Subjective: Intubated but responsive and answers yes/no questions Denies abdominal pain  Objective: Vital signs in last 24 hours: Temp:  [97.5 F (36.4 C)-101.2 F (38.4 C)] 97.5 F (36.4 C) (06/11 0400) Pulse Rate:  [99-115] 101 (06/11 0800) Cardiac Rhythm: Atrial flutter (06/11 0800) Resp:  [23-34] 34 (06/11 0800) BP: (82-136)/(16-110) 124/110 (06/11 0800) SpO2:  [99 %-100 %] 100 % (06/11 0800) FiO2 (%):  [40 %-70 %] 40 % (06/11 0759)  Hemodynamic parameters for last 24 hours:    Intake/Output from previous day: 06/10 0701 - 06/11 0700 In: 3501.7 [I.V.:2688.7; NG/GT:112.3; IV Piggyback:700.7] Out: 2854 [Urine:30; Chest Tube:20] Intake/Output this shift: Total I/O In: 81.1 [I.V.:81.1] Out: -   General appearance: alert, cooperative, and ill appearing Neurologic: non focal Heart: regular rate and rhythm Lungs: rhonchi bilaterally Abdomen: normal findings: soft, non-tender  Lab Results: Recent Labs    04/02/21 1713 04/03/21 0021  WBC 88.4* 82.3*  HGB 9.8* 8.8*  HCT 29.3* 26.2*  PLT 286 256   BMET:  Recent Labs    04/02/21 1713 04/03/21 0021  NA 132* 133*  K 4.5 4.1  CL 98 101  CO2 21* 19*  GLUCOSE 132* 139*  BUN 42* 37*  CREATININE 3.77* 3.18*  CALCIUM 7.2* 6.7*    PT/INR:  Recent Labs    04/01/21 1328  LABPROT 16.2*  INR 1.3*   ABG    Component Value Date/Time   PHART 7.237 (L) 04/02/2021 0352   HCO3 16.9 (L) 04/02/2021 0352   TCO2 18 (L) 04/02/2021 0352   ACIDBASEDEF 10.0 (H) 04/02/2021 0352   O2SAT 96.0 04/02/2021 0352   CBG (last 3)  Recent Labs    04/02/21 2313 04/03/21 0415 04/03/21 0724  GLUCAP 123* 133* 122*    Assessment/Plan: S/P Procedure(s) (LRB): EXPLORATION POST OPERATIVE OPEN HEART (N/A) Remains critically ill NEURO- TME, no focal deficit CV- remains in A flutter  Still on high dose pressors for septic shock-  On norepi at 30 an  vasopressin 0.03 RESP- VDRF, vent per CCM ID- WBC slightly down but still markedly elevated at 82K no definitive source on abdominal CT, likely left lower lobe pneumonia- definitely consolidated on CT, growing yeast in sputum  On Vanco, Maxipime, Flagyl, and Eraxis RENAL- CVVH Gi/ Nutrition-on tube feeds Remains critically ill with multiple organ system failure   LOS: 19 days    Melrose Nakayama 04/03/2021

## 2021-04-03 NOTE — Progress Notes (Signed)
ANTICOAGULATION CONSULT NOTE  Pharmacy Consult for heparin Indication: atrial flutter  Allergies  Allergen Reactions   Shellfish-Derived Products Anaphylaxis   Wasp Venom Protein Anaphylaxis   Penicillins Other (See Comments)    Childhood reaction    Patient Measurements: Height: '6\' 2"'$  (188 cm) Weight: 75.7 kg (166 lb 14.2 oz) IBW/kg (Calculated) : 82.2 Heparin Dosing Weight: 79kg  Vital Signs: Temp: 97.5 F (36.4 C) (06/11 0400) Temp Source: Axillary (06/11 0400) BP: 129/96 (06/11 1000) Pulse Rate: 100 (06/11 1000)  Labs: Recent Labs    04/01/21 1328 04/01/21 1526 04/01/21 1529 04/01/21 1801 04/02/21 0928 04/02/21 1347 04/02/21 1713 04/02/21 2146 04/03/21 0021 04/03/21 0838 04/03/21 0923  HGB 9.9*   < >  --    < > 9.5*  --  9.8*  --  8.8*  --   --   HCT 33.2*   < >  --    < > 28.9*  --  29.3*  --  26.2*  --   --   PLT 326  --   --    < > 281  --  286  --  256  --   --   LABPROT 16.2*  --   --   --   --   --   --   --   --   --   --   INR 1.3*  --   --   --   --   --   --   --   --   --   --   HEPARINUNFRC  --   --   --    < >  --   --   --  >1.10*  --  0.15* 0.21*  CREATININE 4.19*  --   --    < >  --  4.17* 3.77*  --  3.18*  --   --   TROPONINIHS 99*  --  111*  --   --   --   --   --   --   --   --    < > = values in this interval not displayed.     Estimated Creatinine Clearance: 28.1 mL/min (A) (by C-G formula based on SCr of 3.18 mg/dL (H)).   Medical History: Past Medical History:  Diagnosis Date   ESRD (end stage renal disease) ([place])    Hypertension    Peripheral arterial disease ([place])    Type 2 diabetes mellitus ([place])    Assessment: 56 YOF with recent CABG now with postop afib/flutter. There was concern several days ago immediately post op for HIT as plt count dropped to 30s. Antibody was ordered and shows sent, actual lab/blood draw never actually sent out/received by lab although shows in process. Discussed with CCM/HF and ok with low dose  heparin without bolusing for Aflutter and will disregard HIT antibody in process. Heparin started on 6/7 PM.   Heparin level difficult to obtain this AM due to limited access.  Foot stick unsuccessful, phlebotomy eventually got blood from hand.  Heparin level = 0.15.  Also collected a little later from red (pre-filter) port on CRRT = 0.21.  Both levels slightly below goal range.  No overt bleeding or complications noted.  Goal of Therapy:  Heparin level 0.3-0.5  units/ml  Monitor platelets by anticoagulation protocol: Yes   Plan:  - Increase IV heparin to 2050 units/hr. -Check heparin level in 8 hrs. -Daily heparin level and CBC.  [place] Crane, Pharm D,  BCPS, BCCP Clinical Pharmacist  04/03/2021 10:40 AM   [facility] pharmacy phone numbers are listed on [email or web address]

## 2021-04-03 NOTE — Progress Notes (Signed)
Subjective: Patient is critically ill on the ventilator getting CVVHD   Antibiotics:  Anti-infectives (From admission, onward)    Start     Dose/Rate Route Frequency Ordered Stop   04/03/21 1000  anidulafungin (ERAXIS) 100 mg in sodium chloride 0.9 % 100 mL IVPB        100 mg 78 mL/hr over 100 Minutes Intravenous Every 24 hours 04/02/21 1116     04/02/21 1700  ceFEPIme (MAXIPIME) 1 g in sodium chloride 0.9 % 100 mL IVPB        1 g 200 mL/hr over 30 Minutes Intravenous Every 24 hours 04/02/21 1238     04/02/21 1330  metroNIDAZOLE (FLAGYL) IVPB 500 mg        500 mg 100 mL/hr over 60 Minutes Intravenous Every 8 hours 04/02/21 1238     04/02/21 1215  anidulafungin (ERAXIS) 200 mg in sodium chloride 0.9 % 200 mL IVPB  Status:  Discontinued        200 mg 78 mL/hr over 200 Minutes Intravenous Every 24 hours 04/02/21 1116 04/02/21 1117   04/02/21 1215  anidulafungin (ERAXIS) 200 mg in sodium chloride 0.9 % 200 mL IVPB        200 mg 78 mL/hr over 200 Minutes Intravenous  Once 04/02/21 1116 04/02/21 1545   04/01/21 2206  vancomycin variable dose per unstable renal function (pharmacist dosing)  Status:  Discontinued         Does not apply See admin instructions 04/01/21 2206 04/02/21 1217   04/01/21 1830  vancomycin (VANCOREADY) IVPB 1500 mg/300 mL        1,500 mg 150 mL/hr over 120 Minutes Intravenous  Once 04/01/21 1736 04/01/21 2103   03/31/21 1800  ceFEPIme (MAXIPIME) 1 g in sodium chloride 0.9 % 100 mL IVPB  Status:  Discontinued        1 g 200 mL/hr over 30 Minutes Intravenous Every 24 hours 03/30/21 1437 04/02/21 1216   03/29/21 1300  vancomycin (VANCOREADY) IVPB 1000 mg/200 mL  Status:  Discontinued        1,000 mg 200 mL/hr over 60 Minutes Intravenous Every 24 hours 03/28/21 1256 03/29/21 1057   03/28/21 1345  vancomycin (VANCOREADY) IVPB 1500 mg/300 mL        1,500 mg 150 mL/hr over 120 Minutes Intravenous  Once 03/28/21 1256 03/28/21 1622   03/28/21 1345  ceFEPIme  (MAXIPIME) 2 g in sodium chloride 0.9 % 100 mL IVPB  Status:  Discontinued        2 g 200 mL/hr over 30 Minutes Intravenous Every 12 hours 03/28/21 1256 03/30/21 1437   03/30/2021 1000  levofloxacin (LEVAQUIN) IVPB 750 mg        750 mg 100 mL/hr over 90 Minutes Intravenous Every 24 hours 03/06/2021 1300 04/07/2021 1445   03/04/2021 2000  vancomycin (VANCOCIN) IVPB 1000 mg/200 mL premix  Status:  Discontinued        1,000 mg 200 mL/hr over 60 Minutes Intravenous  Once 03/03/2021 1300 03/02/2021 1307   03/07/2021 0903  vancomycin (VANCOCIN) powder  Status:  Discontinued          As needed 03/12/2021 0904 03/08/2021 1241   03/04/2021 0400  vancomycin (VANCOREADY) IVPB 1250 mg/250 mL        1,250 mg 166.7 mL/hr over 90 Minutes Intravenous To Surgery 03/22/21 1012 03/04/2021 0923   03/14/2021 0400  ceFAZolin (ANCEF) IVPB 2g/100 mL premix        2 g  200 mL/hr over 30 Minutes Intravenous To Surgery 03/22/21 1012 03/13/2021 1106   03/03/2021 0400  ceFAZolin (ANCEF) IVPB 2g/100 mL premix  Status:  Discontinued        2 g 200 mL/hr over 30 Minutes Intravenous To Surgery 03/22/21 1012 03/21/2021 1249   03/01/2021 0400  vancomycin (VANCOREADY) IVPB 1250 mg/250 mL  Status:  Discontinued        1,250 mg 166.7 mL/hr over 90 Minutes Intravenous To Surgery 03/22/2021 0806 02/25/2021 1322   03/14/2021 0400  ceFAZolin (ANCEF) IVPB 2g/100 mL premix  Status:  Discontinued        2 g 200 mL/hr over 30 Minutes Intravenous To Surgery 03/09/2021 0806 03/02/2021 1322   02/21/2021 0400  ceFAZolin (ANCEF) IVPB 2g/100 mL premix  Status:  Discontinued        2 g 200 mL/hr over 30 Minutes Intravenous To Surgery 03/05/2021 0806 03/07/2021 1322       Medications: Scheduled Meds:  aspirin  81 mg Per Tube Daily   atorvastatin  40 mg Per Tube Daily   bisacodyl  10 mg Oral Daily   Or   bisacodyl  10 mg Rectal Daily   chlorhexidine gluconate (MEDLINE KIT)  15 mL Mouth Rinse BID   Chlorhexidine Gluconate Cloth  6 each Topical Daily   darbepoetin (ARANESP)  injection - NON-DIALYSIS  60 mcg Subcutaneous Q Thu-1800   docusate  200 mg Per Tube Daily   feeding supplement (PROSource TF)  45 mL Per Tube QID   insulin aspart  0-24 Units Subcutaneous Q4H   insulin glargine  10 Units Subcutaneous Daily   ipratropium-albuterol  3 mL Nebulization QID   mouth rinse  15 mL Mouth Rinse 10 times per day   midodrine  15 mg Per Tube TID WC   multivitamin  1 tablet Per Tube QHS   pantoprazole sodium  40 mg Per Tube Daily   polyethylene glycol  17 g Per Tube Daily   sodium chloride flush  10-40 mL Intracatheter Q12H   sodium chloride flush  3 mL Intravenous Q12H   Thrombi-Pad  1 each Topical Once   Continuous Infusions:   prismasol BGK 4/2.5 500 mL/hr at 04/03/21 1401    prismasol BGK 4/2.5 300 mL/hr at 04/03/21 0651   sodium chloride     sodium chloride Stopped (04/02/21 2123)   amiodarone 30 mg/hr (04/03/21 1300)   anidulafungin Stopped (04/03/21 1136)   ceFEPime (MAXIPIME) IV Stopped (04/02/21 1744)   feeding supplement (VITAL 1.5 CAL) 1,000 mL (04/02/21 1323)   fentaNYL infusion INTRAVENOUS 200 mcg/hr (04/03/21 1300)   heparin 2,050 Units/hr (04/03/21 1300)   lactated ringers     lactated ringers Stopped (04/01/21 1902)   metronidazole 500 mg (04/03/21 1309)   norepinephrine (LEVOPHED) Adult infusion 20 mcg/min (04/03/21 1300)   phenylephrine (NEO-SYNEPHRINE) Adult infusion Stopped (04/03/21 0838)   prismasol BGK 4/2.5 1,500 mL/hr at 04/03/21 1336   vasopressin 0.03 Units/min (04/03/21 1300)   PRN Meds:.heparin, lactated ringers, lidocaine (PF), lip balm, metoprolol tartrate, morphine injection, ondansetron (ZOFRAN) IV, oxyCODONE, sodium chloride, sodium chloride flush, sodium chloride flush    Objective: Weight change:   Intake/Output Summary (Last 24 hours) at 04/03/2021 1410 Last data filed at 04/03/2021 1309 Gross per 24 hour  Intake 3235.98 ml  Output 3657 ml  Net -421.02 ml   Blood pressure (!) 109/59, pulse (!) 101, temperature  97.6 F (36.4 C), temperature source Oral, resp. rate (!) 34, height '6\' 2"'  (1.88 m), weight 75.7 kg,  SpO2 99 %. Temp:  [97.5 F (36.4 C)-97.9 F (36.6 C)] 97.6 F (36.4 C) (06/11 1109) Pulse Rate:  [99-104] 101 (06/11 1300) Resp:  [23-34] 34 (06/11 1300) BP: (68-136)/(16-110) 109/59 (06/11 1300) SpO2:  [98 %-100 %] 99 % (06/11 1300) FiO2 (%):  [40 %-70 %] 40 % (06/11 1037)  Physical Exam: Physical Exam Constitutional:      Appearance: He is ill-appearing and toxic-appearing.     Interventions: He is intubated.  HENT:     Head: Normocephalic and atraumatic.  Eyes:     Extraocular Movements: Extraocular movements intact.  Cardiovascular:     Rate and Rhythm: Tachycardia present.     Heart sounds: No murmur heard.   No friction rub. No gallop.  Pulmonary:     Effort: He is intubated.     Breath sounds: No wheezing.  Abdominal:     General: Bowel sounds are normal. There is no distension.     Palpations: There is no mass.     Tenderness: There is no abdominal tenderness.  Skin:    Coloration: Skin is pale.    Sternal wound is clean CBC:    BMET Recent Labs    04/02/21 1713 04/03/21 0021  NA 132* 133*  K 4.5 4.1  CL 98 101  CO2 21* 19*  GLUCOSE 132* 139*  BUN 42* 37*  CREATININE 3.77* 3.18*  CALCIUM 7.2* 6.7*     Liver Panel  Recent Labs    04/02/21 0344 04/02/21 1347 04/03/21 0021  PROT 5.6*  --  5.3*  ALBUMIN 2.2* 2.1* 2.0*  AST 44*  --  30  ALT 23  --  19  ALKPHOS 205*  --  200*  BILITOT 1.8*  --  2.3*       Sedimentation Rate No results for input(s): ESRSEDRATE in the last 72 hours. C-Reactive Protein No results for input(s): CRP in the last 72 hours.  Micro Results: Recent Results (from the past 720 hour(s))  Surgical PCR screen     Status: None   Collection Time: 03/20/2021  2:37 PM   Specimen: Nasopharyngeal Swab; Nasal Swab  Result Value Ref Range Status   MRSA, PCR NEGATIVE NEGATIVE Final   Staphylococcus aureus NEGATIVE NEGATIVE  Final    Comment: (NOTE) The Xpert SA Assay (FDA approved for NASAL specimens in patients 74 years of age and older), is one component of a comprehensive surveillance program. It is not intended to diagnose infection nor to guide or monitor treatment. Performed at [facility], [place] [address], [place], [place] [address]   SARS CORONAVIRUS 2 (TAT 6-24 HRS) Nasopharyngeal Nasopharyngeal Swab     Status: None   Collection Time: 03/02/2021  3:12 PM   Specimen: Nasopharyngeal Swab  Result Value Ref Range Status   SARS Coronavirus 2 NEGATIVE NEGATIVE Final    Comment: (NOTE) SARS-CoV-2 target nucleic acids are NOT DETECTED.  The SARS-CoV-2 RNA is generally detectable in upper and lower respiratory specimens during the acute phase of infection. Negative results do not preclude SARS-CoV-2 infection, do not rule out co-infections with other pathogens, and should not be used as the sole basis for treatment or other patient management decisions. Negative results must be combined with clinical observations, patient history, and epidemiological information. The expected result is Negative.  Fact Sheet for Patients: SugarRoll.be  Fact Sheet for Healthcare Providers: https://www.woods-mathews.com/  This test is not yet approved or cleared by the [place] FDA and  has been authorized for detection  and/or diagnosis of SARS-CoV-2 by FDA under an Emergency Use Authorization (EUA). This EUA will remain  in effect (meaning this test can be used) for the duration of the COVID-19 declaration under Se ction 564(b)(1) of the Act, 21 U.S.C. section 360bbb-3(b)(1), unless the authorization is terminated or revoked sooner.  Performed at [facility], [place] [address], [place], [place] [address]   Surgical pcr screen     Status: None   Collection Time: 03/14/2021  2:43 AM   Specimen: Nasal Mucosa; Nasal Swab  Result Value Ref Range Status   MRSA,  PCR NEGATIVE NEGATIVE Final   Staphylococcus aureus NEGATIVE NEGATIVE Final    Comment: (NOTE) The Xpert SA Assay (FDA approved for NASAL specimens in patients 45 years of age and older), is one component of a comprehensive surveillance program. It is not intended to diagnose infection nor to guide or monitor treatment. Performed at [facility], [place] [address], [place], [place] [address]   MRSA PCR Screening     Status: None   Collection Time: 03/28/21  1:18 PM   Specimen: Nasopharyngeal  Result Value Ref Range Status   MRSA by PCR NEGATIVE NEGATIVE Final    Comment:        The GeneXpert MRSA Assay (FDA approved for NASAL specimens only), is one component of a comprehensive MRSA colonization surveillance program. It is not intended to diagnose MRSA infection nor to guide or monitor treatment for MRSA infections. Performed at [facility], [place] [address], [place], [place] [address]   Culture, Urine     Status: None   Collection Time: 04/02/21  8:17 AM   Specimen: Urine, Random  Result Value Ref Range Status   Specimen Description URINE, RANDOM  Final   Special Requests NONE  Final   Culture   Final    NO GROWTH Performed at [facility], [place] [address] [place], [place] [address]    Report Status 04/03/2021 FINAL  Final  Culture, blood (routine x 2)     Status: None (Preliminary result)   Collection Time: 04/02/21 11:44 AM   Specimen: BLOOD LEFT HAND  Result Value Ref Range Status   Specimen Description BLOOD LEFT HAND  Final   Special Requests   Final    BOTTLES DRAWN AEROBIC ONLY Blood Culture results may not be optimal due to an inadequate volume of blood received in culture bottles   Culture   Final    NO GROWTH < 24 HOURS Performed at [facility], [place] [address] [place], [place] [address]    Report Status PENDING  Incomplete  Expectorated Sputum Assessment w Gram Stain, Rflx to Resp Cult     Status: None   Collection Time: 04/02/21   3:19 PM   Specimen: Expectorated Sputum  Result Value Ref Range Status   Specimen Description EXPECTORATED SPUTUM  Final   Special Requests NONE  Final   Sputum evaluation   Final    THIS SPECIMEN IS ACCEPTABLE FOR SPUTUM CULTURE Performed at [facility], [place] [address] [place], [place] [address]    Report Status 04/03/2021 FINAL  Final  Culture, Respiratory w Gram Stain     Status: None (Preliminary result)   Collection Time: 04/02/21  3:19 PM  Result Value Ref Range Status   Specimen Description EXPECTORATED SPUTUM  Final   Special Requests NONE Reflexed from [id number]  Final   Gram Stain   Final    MODERATE  WBC PRESENT,BOTH PMN AND MONONUCLEAR ABUNDANT YEAST Performed at [facility], [place] [address] [place], [place] [address]    Culture PENDING  Incomplete   Report Status PENDING  Incomplete    Studies/Results: CT ABDOMEN PELVIS WO CONTRAST  Result Date: 04/03/2021 CLINICAL DATA:  Right upper quadrant pain, leukocytosis, Murphy sign EXAM: CT ABDOMEN AND PELVIS WITHOUT CONTRAST TECHNIQUE: Multidetector CT imaging of the abdomen and pelvis was performed following the standard protocol without IV contrast. COMPARISON:  CTa chest 04/01/2021 FINDINGS: Lower chest: Multifocal patchy ground-glass and consolidative opacities in the lung bases with partial atelectatic collapse of both lower lobes. Bilateral pleural effusions as well including some questionable loculation of the left effusion. Borderline cardiomegaly. 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 Postsurgical changes from prior sternotomy. Hepatobiliary: Lobular hepatic surface contour. Hypertrophy of the left lobe and caudate. No concerning focal liver lesion is seen. Few vascular calcifications. Hyperdense material layering within the moderately distended gallbladder, possible vicarious extravasation of contrast from recent CT imaging versus layering  biliary sludge. No discernible gallstones. No biliary ductal dilatation. Pancreas: Extensive fatty replacement of the pancreas. No pancreatic ductal dilatation or surrounding inflammatory changes. Spleen: Punctate calcifications throughout the spleen likely reflecting granulomata. Normal splenic size. No concerning focal splenic lesion. Adrenals/Urinary Tract: Normal adrenals. Moderate bilateral perinephric stranding is symmetric and nonspecific. Appearance is similar to comparison CT imaging extensive vascular calcifications. No hydronephrosis or evidence of obstructive uropathy. Hyperdense material layering within the partially decompressed urinary bladder likely reflecting excreted contrast media. Bladder wall thickening is nonspecific given underdistention. Stomach/Bowel: Transesophageal tube tip and side port distal to the GE junction, terminating near the gastric antrum. Mild mural thickening and stranding about the terminal ileum as well as diffusely throughout the colonic segments. No evidence of bowel obstruction. Normal caliber appendix in the right quadrant. Vascular/Lymphatic: Extensive atherosclerotic calcification of the abdominal aorta and branch vessels. No aneurysm or ectasia. No other gross complication limitations of an unenhanced CT. Reproductive: Coarse eccentric calcification of the prostate. No concerning abnormalities of the prostate or seminal vesicles. High-riding appearance of the bilateral testes with small hydroceles. Other: Small moderate volume ascites particularly in the right upper quadrant and layering in the deep pelvis pericolic gutter. Extensive circumferential body wall edema. No free intraperitoneal air. Fat and fluid containing left paraumbilical hernia. No bowel containing hernia. Musculoskeletal: No acute osseous abnormality or suspicious osseous lesion. Multilevel degenerative changes are present in the imaged portions of the spine. Posterior spurring and calcified disc  bulge L5-S1 resulting in severe canal stenosis and bilateral foraminal narrowing. IMPRESSION: 1. Stigmata of cirrhosis with a nodular liver surface contour as well as hypertrophy of the left lobe and caudate. 2. Features of developing anasarca with moderate ascites and extensive circumferential body wall edema as well as bilateral pleural effusions. 3. Moderate distention of the gallbladder with layering hyperdense material which could reflect vicarious extravasation of contrast given recent contrast enhanced exams. No gallbladder wall thickening or biliary ductal dilatation is seen however if there is persisting clinical concern, could consider right upper quadrant ultrasound. 4. Circumferential thickening of the terminal ileum and diffusely throughout the colon, nonspecific in the setting of intrinsic liver disease. Could reflect portal enteropathy/colopathy versus ileocolitis in the appropriate clinical setting. 5. Patchy opacities in bilateral lung bases as well as densely atelectatic bilateral lower lobes, could reflect a combination of atelectasis, underlying airspace disease/infection and/or edema. Bilateral effusions with some questionable loculation of the left effusion as  well. 6. Mild cardiomegaly with three-vessel coronary disease in postsurgical changes from sternotomy and CABG. Electronically Signed   By: Lovena Le M.D.   On: 04/03/2021 00:24   DG Abd 1 View  Result Date: 04/02/2021 CLINICAL DATA:  MRI clearance EXAM: ABDOMEN - 1 VIEW COMPARISON:  None. FINDINGS: Normal abdominal gas pattern. Nasogastric tube seen in the expected distal body of the stomach. No unexpected metallic foreign body noted within the visualized abdomen. IMPRESSION: No unexpected metallic foreign body within the abdomen. Electronically Signed   By: Fidela Salisbury MD   On: 04/02/2021 00:36   CT Angio Chest Pulmonary Embolism (PE) W or WO Contrast  Result Date: 04/01/2021 CLINICAL DATA:  Chest pain, shortness of breath,  worsening shock, altered mental status chest EXAM: CT ANGIOGRAPHY CHEST WITH CONTRAST TECHNIQUE: Multidetector CT imaging of the chest was performed using the standard protocol during bolus administration of intravenous contrast. Multiplanar CT image reconstructions and MIPs were obtained to evaluate the vascular anatomy. CONTRAST:  186m OMNIPAQUE IOHEXOL 350 MG/ML SOLN IV COMPARISON:  CT chest 03/07/2021 FINDINGS: Cardiovascular: Atherosclerotic calcifications aorta, proximal great vessels and coronary arteries. Aorta normal caliber without aneurysm or dissection. Heart unremarkable. No pericardial effusion. Pulmonary arteries adequately opacified and patent. Lower lobe pulmonary arteries suboptimally assessed due to degree of atelectasis. No definite evidence of pulmonary embolism. Mediastinum/Nodes: Esophagus unremarkable. No thoracic adenopathy. Base of cervical region normal appearance. Stranding in anterior mediastinum consistent with recent median sternotomy. Lungs/Pleura: BILATERAL pleural effusions. RIGHT thoracostomy tube. Extensive atelectasis versus consolidation of lower lobes. Patchy airspace infiltrates in the upper lobes bilaterally and RIGHT middle lobe, question edema versus infection. No pneumothorax. Upper Abdomen: Small amount of perihepatic free fluid. Extensive atherosclerotic calcifications. Musculoskeletal: No acute osseous findings. Review of the MIP images confirms the above findings. IMPRESSION: No definite evidence of pulmonary embolism. Moderate-sized BILATERAL pleural effusions and significant atelectasis versus consolidation of lower lobes. Mild patchy infiltrates in remaining lungs which may represent edema or infection. Stranding in anterior mediastinum consistent with recent median sternotomy. Aortic Atherosclerosis (ICD10-I70.0). Electronically Signed   By: MLavonia DanaM.D.   On: 04/01/2021 15:14   DG CHEST PORT 1 VIEW  Result Date: 04/03/2021 CLINICAL DATA:  Respiratory  dependent, evaluate endotracheal tube EXAM: PORTABLE CHEST 1 VIEW COMPARISON:  04/02/2021 FINDINGS: There is an endotracheal tube overlying the midthoracic trachea. There is a nasogastric tube coursing below the diaphragm, tip excluded by collimation. Right neck catheter has been removed since the prior exam. Unchanged cardiomediastinal silhouette. There is a small left pleural effusion with adjacent left basilar consolidation. Small right pleural effusion. Diffuse mild interstitial opacities. There is no visible pneumothorax. Bones are unchanged. IMPRESSION: Persistent left greater than right pleural effusions with adjacent basilar atelectasis. Lines and tubes as described above. Right neck catheter has been removed since the prior exam. Electronically Signed   By: JMaurine Simmering  On: 04/03/2021 10:10   DG CHEST PORT 1 VIEW  Result Date: 04/02/2021 CLINICAL DATA:  Hypoxia EXAM: PORTABLE CHEST 1 VIEW COMPARISON:  April 01, 2021 FINDINGS: Endotracheal tube tip is 3.8 cm above the carina. Nasogastric tube tip and side port are below the diaphragm. Central catheter tip in superior vena cava. No pneumothorax. There is a left pleural effusion with atelectasis in the left lower lung region. There is been significant interval clearing on the right with right lung essentially clear. Heart size and pulmonary vascularity are normal. Patient is status post median sternotomy. No adenopathy. No bone lesions. IMPRESSION: Tube  and catheter positions as described without pneumothorax. Right lung now clear. There is a left pleural effusion with left base atelectasis. Stable cardiac silhouette. Electronically Signed   By: Lowella Grip III M.D.   On: 04/02/2021 08:03   DG CHEST PORT 1 VIEW  Result Date: 04/01/2021 CLINICAL DATA:  Intubation EXAM: PORTABLE CHEST 1 VIEW COMPARISON:  Portable exam 1445 hours compared to 04/01/2021 FINDINGS: Tip of endotracheal tube projects 5.3 cm above carina. Nasogastric tube extends into  stomach. RIGHT jugular line with tip projecting over confluence of SVC. External pacing leads present. Normal heart size post median sternotomy. Bibasilar effusions and atelectasis greater on RIGHT. No pneumothorax or segmental infiltrate. IMPRESSION: Bibasilar pleural effusions and atelectasis greater on RIGHT. Electronically Signed   By: Lavonia Dana M.D.   On: 04/01/2021 14:55   DG Abd Portable 1V  Result Date: 04/02/2021 CLINICAL DATA:  Status post feeding tube placement EXAM: PORTABLE ABDOMEN - 1 VIEW COMPARISON:  One day prior FINDINGS: Nasogastric tube has been removed. Feeding tube terminates at the distal stomach. Prior median sternotomy. Mild cardiomegaly. Small left pleural effusion. Grossly nonobstructive bowel gas pattern. IMPRESSION: Feeding tube terminating at the distal stomach. Electronically Signed   By: Abigail Miyamoto M.D.   On: 04/02/2021 12:40   EEG adult  Result Date: 04/01/2021 Lora Havens, MD     04/01/2021  6:01 PM Patient Name: [name]. MRN: [id number] Epilepsy Attending: Lora Havens Referring Physician/Provider: Dr Noemi Chapel Date: 04/01/2021 Duration: 27.16 mins  Patient history: 55yo M s/p cardiac arrest, ws improving but had an episode of sudden onset unresponsiveness. EEG to evaluate for seizure  Level of alertness: awake  AEDs during EEG study: None  Technical aspects: This EEG study was done with scalp electrodes positioned according to the 10-20 International system of electrode placement. Electrical activity was acquired at a sampling rate of '500Hz'  and reviewed with a high frequency filter of '70Hz'  and a low frequency filter of '1Hz' . EEG data were recorded continuously and digitally stored.  Description: EEG showed continuous generalized 5 to 6 Hz theta as well as intermittent 2-'3Hz'  delta slowing. Hyperventilation and photic stimulation were not performed.     ABNORMALITY - Continuous slow, generalized  IMPRESSION: This study is suggestive of moderate diffuse  encephalopathy, nonspecific etiology. No seizures or epileptiform discharges were seen throughout the recording.    Priyanka O Yadav    [place] EKG SITE RITE  Result Date: 04/02/2021 If Site Rite image not attached, placement could not be confirmed due to current cardiac rhythm.  CT ANGIO HEAD CODE STROKE  Result Date: 04/01/2021 CLINICAL DATA:  Neuro deficit, acute stroke suspected. EXAM: CT ANGIOGRAPHY HEAD AND NECK TECHNIQUE: Multidetector CT imaging of the head and neck was performed using the standard protocol during bolus administration of intravenous contrast. Multiplanar CT image reconstructions and MIPs were obtained to evaluate the vascular anatomy. Carotid stenosis measurements (when applicable) are obtained utilizing NASCET criteria, using the distal internal carotid diameter as the denominator. CONTRAST:  45m OMNIPAQUE IOHEXOL 350 MG/ML SOLN COMPARISON:  None. FINDINGS: CTA NECK FINDINGS Aortic arch: Calcific atherosclerosis of the aorta. Great vessel origins are patent. Right carotid system: Predominately calcific atherosclerosis at the carotid bifurcation without greater than 50% narrowing. The internal carotid artery is small throughout its course without focal hemodynamically significant stenosis. Left carotid system: Predominately calcific atherosclerosis at the carotid bifurcation without greater than 50% narrowing. The internal carotid artery is small throughout its course without focal hemodynamically significant  stenosis. Vertebral arteries: Severe stenosis of the right vertebral artery origin secondary to atherosclerosis. Skeleton: No evidence of acute abnormality on limited assessment. Other neck: Mild diffuse soft tissue swelling. Upper chest: Please see concurrent CT chest for intrathoracic findings. Review of the MIP images confirms the above findings CTA HEAD FINDINGS Anterior circulation: Bilateral calcific and paraclinoid ICA calcific atherosclerosis without evidence of flow limiting  stenosis. Bilateral MCAs and ACAs are patent without evidence of proximal hemodynamically significant stenosis. No aneurysm. Posterior circulation: Small left vertebral artery. Moderate left and mild right narrowing of the intradural vertebral arteries proximally due to calcific atherosclerosis. The basilar artery and bilateral posterior cerebral arteries are patent without proximal hemodynamically significant stenosis. No aneurysm. Venous sinuses: As permitted by contrast timing, patent. Review of the MIP images confirms the above findings IMPRESSION: CTA Head: 1. No large vessel occlusion. 2. 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 CTA Neck: 1. Severe atherosclerotic narrowing of the right vertebral artery origin. 2. Bilateral carotid bifurcation atherosclerosis without greater than 50% narrowing. 3. Small internal carotid arteries in the neck, nonspecific but potentially chronic/congenital. 4. Mild diffuse subcutaneous soft tissue swelling, suggestive of anasarca. 5. Please see concurrent CT chest for intrathoracic findings. Findings discussed with Dr. Fuller Plan at 2:28 p.m. via telephone. Electronically Signed   By: Margaretha Sheffield MD   On: 04/01/2021 14:40   CT ANGIO NECK CODE STROKE  Result Date: 04/01/2021 CLINICAL DATA:  Neuro deficit, acute stroke suspected. EXAM: CT ANGIOGRAPHY HEAD AND NECK TECHNIQUE: Multidetector CT imaging of the head and neck was performed using the standard protocol during bolus administration of intravenous contrast. Multiplanar CT image reconstructions and MIPs were obtained to evaluate the vascular anatomy. Carotid stenosis measurements (when applicable) are obtained utilizing NASCET criteria, using the distal internal carotid diameter as the denominator. CONTRAST:  36m OMNIPAQUE IOHEXOL 350 MG/ML SOLN COMPARISON:  None. FINDINGS: CTA NECK FINDINGS Aortic arch: Calcific atherosclerosis of the aorta. Great vessel origins are patent. Right carotid  system: Predominately calcific atherosclerosis at the carotid bifurcation without greater than 50% narrowing. The internal carotid artery is small throughout its course without focal hemodynamically significant stenosis. Left carotid system: Predominately calcific atherosclerosis at the carotid bifurcation without greater than 50% narrowing. The internal carotid artery is small throughout its course without focal hemodynamically significant stenosis. Vertebral arteries: Severe stenosis of the right vertebral artery origin secondary to atherosclerosis. Skeleton: No evidence of acute abnormality on limited assessment. Other neck: Mild diffuse soft tissue swelling. Upper chest: Please see concurrent CT chest for intrathoracic findings. Review of the MIP images confirms the above findings CTA HEAD FINDINGS Anterior circulation: Bilateral calcific and paraclinoid ICA calcific atherosclerosis without evidence of flow limiting stenosis. Bilateral MCAs and ACAs are patent without evidence of proximal hemodynamically significant stenosis. No aneurysm. Posterior circulation: Small left vertebral artery. Moderate left and mild right narrowing of the intradural vertebral arteries proximally due to calcific atherosclerosis. The basilar artery and bilateral posterior cerebral arteries are patent without proximal hemodynamically significant stenosis. No aneurysm. Venous sinuses: As permitted by contrast timing, patent. Review of the MIP images confirms the above findings IMPRESSION: CTA Head: 1. No large vessel occlusion. 2. 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 CTA Neck: 1. Severe atherosclerotic narrowing of the right vertebral artery origin. 2. Bilateral carotid bifurcation atherosclerosis without greater than 50% narrowing. 3. Small internal carotid arteries in the neck, nonspecific but potentially chronic/congenital. 4. Mild diffuse subcutaneous soft tissue swelling, suggestive of anasarca. 5.  Please  see concurrent CT chest for intrathoracic findings. Findings discussed with Dr. Fuller Plan at 2:28 p.m. via telephone. Electronically Signed   By: Margaretha Sheffield MD   On: 04/01/2021 14:40      Assessment/Plan:  INTERVAL HISTORY: Patient receiving CVVHD leukocytosis has not changed too much   Active Problems:   Acute on chronic systolic heart failure (HCC)   S/P CABG x 4   Protein-calorie malnutrition, severe   Acute respiratory failure with hypoxia (HCC)   Postoperative hypovolemic shock   Chest tube in place   Chronic kidney disease    Eliyas Suddreth. is a 55 y.o. 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  He then developed dramatically worsening leukocytosis with white blood cell count jumping from 28,000-87,800  .  CT of the abdomen and pelvis raise question of possible ileal colitis.  He still has pleural effusions  He has been broadened to cefepime Flagyl and also Eraxis (though I do not see a clear target for this antifungal--the yeast in the lungs is surely not pathogenic)  I am okay with continuing current antimicrobials and following cultures.  Would consider therapeutic and diagnostic thoracentesis   I spent more than 35 minutes with the patient including greater than 50% of time in face to face counseling of the patient personally reviewing radiographs, along with pertinent laboratory microbiological data review of medical records and in coordination of his care.    LOS: 19 days   Alcide Evener 04/03/2021, 2:10 PM

## 2021-04-03 NOTE — Progress Notes (Signed)
      [address]       [place], [address]             [phone]      Asleep  BP (!) 92/46   Pulse (!) 103   Temp (!) 97.16 F (36.2 C)   Resp (!) 34   Ht '6\' 2"'$  (1.88 m)   Wt 75.7 kg   SpO2 99%   BMI 21.43 kg/m  Levophed @ 20, vasopressin 0.03  Intake/Output Summary (Last 24 hours) at 04/03/2021 1851 Last data filed at 04/03/2021 1800 Gross per 24 hour  Intake 3075.07 ml  Output 3928 ml  Net -852.93 ml   No evidence of acute chole on [place]  Continue current Rx.  Revonda Standard Roxan Hockey, MD [facility] [phone]

## 2021-04-03 NOTE — Progress Notes (Signed)
Patient ID: David Rose., male   DOB: [birth year], 55 y.o.   MRN: [id number] S: No events overnight.  PT seen and examined while on CRRT and labs and orders reviewed. O:BP (!) 124/110 (BP Location: Left Leg)   Pulse (!) 101   Temp (!) 97.5 F (36.4 C) (Axillary)   Resp (!) 34   Ht '6\' 2"'  (1.88 m)   Wt 75.7 kg   SpO2 100%   BMI 21.43 kg/m   Intake/Output Summary (Last 24 hours) at 04/03/2021 0856 Last data filed at 04/03/2021 0800 Gross per 24 hour  Intake 3285.84 ml  Output 2950 ml  Net 335.84 ml   Intake/Output: I/O last 3 completed shifts: In: 4813.9 [I.V.:3692.7; NG/GT:112.3; IV Piggyback:1008.8] Out: 2336 [Urine:30; Emesis/NG output:350; PQAES:9753; Chest Tube:170]  Intake/Output this shift:  Total I/O In: 81.1 [I.V.:81.1] Out: 126 [Other:126] Weight change:  YYF:RTMYTRZNB and resting comfortably.  Will awaken and respond. VAP:OLIDC Resp: scattered rhonchi VUD:THYHOOILNZ BS Ext: 1+ gravity dependent edema  Recent Labs  Lab 03/28/21 0405 03/28/21 1620 03/29/21 0325 03/29/21 1533 03/30/21 0414 03/31/21 0356 04/01/21 0325 04/01/21 1328 04/01/21 1526 04/01/21 2010 04/01/21 2212 04/02/21 0344 04/02/21 0352 04/02/21 1347 04/02/21 1713 04/03/21 0021  NA 134*   < > 134* 133* 133* 132* 129* 127*   < > 132* 128* 129* 130* 133* 132* 133*  K 4.7   < > 5.0 4.8 4.6 4.4 4.6 5.2*   < > 3.6 3.5 4.3 4.3 4.4 4.5 4.1  CL 103   < > 101 99 102 99 95* 95*  --   --  98 97*  --  98 98 101  CO2 26   < > '25 23 25 22 22 ' 21*  --   --  18* 16*  --  19* 21* 19*  GLUCOSE 165*   < > 132* 209* 117* 170* 175* 341*  --   --  72 102*  --  125* 132* 139*  BUN 17   < > '17 18 20 ' 29* 37* 43*  --   --  45* 50*  --  45* 42* 37*  CREATININE 1.60*   < > 1.53* 1.61* 1.73* 2.63* 3.56* 4.19*  --   --  4.33* 4.60*  --  4.17* 3.77* 3.18*  ALBUMIN 2.6*   < > 2.9* 2.8* 2.6* 2.5* 2.5*  --   --   --   --  2.2*  --  2.1*  --  2.0*  CALCIUM 7.7*   < > 7.7* 7.7* 7.8* 7.4* 7.2* 7.4*  --   --  7.2* 7.4*  --   7.0* 7.2* 6.7*  PHOS 1.9*   < > 2.2* 5.4* 2.6 3.0 4.2  --   --   --   --  3.3  --  3.6 3.8 3.6  AST 31  --  38  --  40 88* 56*  --   --   --   --  44*  --   --   --  30  ALT 10  --  13  --  '15 30 28  ' --   --   --   --  23  --   --   --  19   < > = values in this interval not displayed.   Liver Function Tests: Recent Labs  Lab 04/01/21 0325 04/02/21 0344 04/02/21 1347 04/03/21 0021  AST 56* 44*  --  30  ALT 28 23  --  19  ALKPHOS 249* 205*  --  200*  BILITOT 1.8* 1.8*  --  2.3*  PROT 6.0* 5.6*  --  5.3*  ALBUMIN 2.5* 2.2* 2.1* 2.0*   No results for input(s): LIPASE, AMYLASE in the last 168 hours. No results for input(s): AMMONIA in the last 168 hours. CBC: Recent Labs  Lab 04/01/21 1328 04/01/21 1526 04/02/21 0344 04/02/21 0352 04/02/21 0928 04/02/21 1713 04/03/21 0021  WBC 28.1*  --  87.8*  --  86.9* 88.4* 82.3*  NEUTROABS 21.1*  --   --   --   --   --  75.2*  HGB 9.9*   < > 9.8*   < > 9.5* 9.8* 8.8*  HCT 33.2*   < > 30.3*   < > 28.9* 29.3* 26.2*  MCV 100.0  --  95.3  --  93.2 91.8 91.3  PLT 326  --  272  --  281 286 256   < > = values in this interval not displayed.   Cardiac Enzymes: No results for input(s): CKTOTAL, CKMB, CKMBINDEX, TROPONINI in the last 168 hours. CBG: Recent Labs  Lab 04/02/21 1506 04/02/21 1932 04/02/21 2313 04/03/21 0415 04/03/21 0724  GLUCAP 120* 110* 123* 133* 122*    Iron Studies: No results for input(s): IRON, TIBC, TRANSFERRIN, FERRITIN in the last 72 hours. Studies/Results: CT ABDOMEN PELVIS WO CONTRAST  Result Date: 04/03/2021 CLINICAL DATA:  Right upper quadrant pain, leukocytosis, Murphy sign EXAM: CT ABDOMEN AND PELVIS WITHOUT CONTRAST TECHNIQUE: Multidetector CT imaging of the abdomen and pelvis was performed following the standard protocol without IV contrast. COMPARISON:  CTa chest 04/01/2021 FINDINGS: Lower chest: Multifocal patchy ground-glass and consolidative opacities in the lung bases with partial atelectatic collapse  of both lower lobes. Bilateral pleural effusions as well including some questionable loculation of the left effusion. Borderline cardiomegaly. 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 Postsurgical changes from prior sternotomy. Hepatobiliary: Lobular hepatic surface contour. Hypertrophy of the left lobe and caudate. No concerning focal liver lesion is seen. Few vascular calcifications. Hyperdense material layering within the moderately distended gallbladder, possible vicarious extravasation of contrast from recent CT imaging versus layering biliary sludge. No discernible gallstones. No biliary ductal dilatation. Pancreas: Extensive fatty replacement of the pancreas. No pancreatic ductal dilatation or surrounding inflammatory changes. Spleen: Punctate calcifications throughout the spleen likely reflecting granulomata. Normal splenic size. No concerning focal splenic lesion. Adrenals/Urinary Tract: Normal adrenals. Moderate bilateral perinephric stranding is symmetric and nonspecific. Appearance is similar to comparison CT imaging extensive vascular calcifications. No hydronephrosis or evidence of obstructive uropathy. Hyperdense material layering within the partially decompressed urinary bladder likely reflecting excreted contrast media. Bladder wall thickening is nonspecific given underdistention. Stomach/Bowel: Transesophageal tube tip and side port distal to the GE junction, terminating near the gastric antrum. Mild mural thickening and stranding about the terminal ileum as well as diffusely throughout the colonic segments. No evidence of bowel obstruction. Normal caliber appendix in the right quadrant. Vascular/Lymphatic: Extensive atherosclerotic calcification of the abdominal aorta and branch vessels. No aneurysm or ectasia. No other gross complication limitations of an unenhanced CT. Reproductive: Coarse eccentric calcification of the prostate.  No concerning abnormalities of the prostate or seminal vesicles. High-riding appearance of the bilateral testes with small hydroceles. Other: Small moderate volume ascites particularly in the right upper quadrant and layering in the deep pelvis pericolic gutter. Extensive circumferential body wall edema. No free intraperitoneal air. Fat and fluid containing left paraumbilical hernia. No bowel containing hernia. Musculoskeletal:  No acute osseous abnormality or suspicious osseous lesion. Multilevel degenerative changes are present in the imaged portions of the spine. Posterior spurring and calcified disc bulge L5-S1 resulting in severe canal stenosis and bilateral foraminal narrowing. IMPRESSION: 1. Stigmata of cirrhosis with a nodular liver surface contour as well as hypertrophy of the left lobe and caudate. 2. Features of developing anasarca with moderate ascites and extensive circumferential body wall edema as well as bilateral pleural effusions. 3. Moderate distention of the gallbladder with layering hyperdense material which could reflect vicarious extravasation of contrast given recent contrast enhanced exams. No gallbladder wall thickening or biliary ductal dilatation is seen however if there is persisting clinical concern, could consider right upper quadrant ultrasound. 4. Circumferential thickening of the terminal ileum and diffusely throughout the colon, nonspecific in the setting of intrinsic liver disease. Could reflect portal enteropathy/colopathy versus ileocolitis in the appropriate clinical setting. 5. Patchy opacities in bilateral lung bases as well as densely atelectatic bilateral lower lobes, could reflect a combination of atelectasis, underlying airspace disease/infection and/or edema. Bilateral effusions with some questionable loculation of the left effusion as well. 6. Mild cardiomegaly with three-vessel coronary disease in postsurgical changes from sternotomy and CABG. Electronically Signed    By: Lovena Le M.D.   On: 04/03/2021 00:24   DG Abd 1 View  Result Date: 04/02/2021 CLINICAL DATA:  MRI clearance EXAM: ABDOMEN - 1 VIEW COMPARISON:  None. FINDINGS: Normal abdominal gas pattern. Nasogastric tube seen in the expected distal body of the stomach. No unexpected metallic foreign body noted within the visualized abdomen. IMPRESSION: No unexpected metallic foreign body within the abdomen. Electronically Signed   By: Fidela Salisbury MD   On: 04/02/2021 00:36   CT Angio Chest Pulmonary Embolism (PE) W or WO Contrast  Result Date: 04/01/2021 CLINICAL DATA:  Chest pain, shortness of breath, worsening shock, altered mental status chest EXAM: CT ANGIOGRAPHY CHEST WITH CONTRAST TECHNIQUE: Multidetector CT imaging of the chest was performed using the standard protocol during bolus administration of intravenous contrast. Multiplanar CT image reconstructions and MIPs were obtained to evaluate the vascular anatomy. CONTRAST:  184m OMNIPAQUE IOHEXOL 350 MG/ML SOLN IV COMPARISON:  CT chest 03/20/2021 FINDINGS: Cardiovascular: Atherosclerotic calcifications aorta, proximal great vessels and coronary arteries. Aorta normal caliber without aneurysm or dissection. Heart unremarkable. No pericardial effusion. Pulmonary arteries adequately opacified and patent. Lower lobe pulmonary arteries suboptimally assessed due to degree of atelectasis. No definite evidence of pulmonary embolism. Mediastinum/Nodes: Esophagus unremarkable. No thoracic adenopathy. Base of cervical region normal appearance. Stranding in anterior mediastinum consistent with recent median sternotomy. Lungs/Pleura: BILATERAL pleural effusions. RIGHT thoracostomy tube. Extensive atelectasis versus consolidation of lower lobes. Patchy airspace infiltrates in the upper lobes bilaterally and RIGHT middle lobe, question edema versus infection. No pneumothorax. Upper Abdomen: Small amount of perihepatic free fluid. Extensive atherosclerotic  calcifications. Musculoskeletal: No acute osseous findings. Review of the MIP images confirms the above findings. IMPRESSION: No definite evidence of pulmonary embolism. Moderate-sized BILATERAL pleural effusions and significant atelectasis versus consolidation of lower lobes. Mild patchy infiltrates in remaining lungs which may represent edema or infection. Stranding in anterior mediastinum consistent with recent median sternotomy. Aortic Atherosclerosis (ICD10-I70.0). Electronically Signed   By: MLavonia DanaM.D.   On: 04/01/2021 15:14   DG CHEST PORT 1 VIEW  Result Date: 04/02/2021 CLINICAL DATA:  Hypoxia EXAM: PORTABLE CHEST 1 VIEW COMPARISON:  April 01, 2021 FINDINGS: Endotracheal tube tip is 3.8 cm above the carina. Nasogastric tube tip and side port are below the  diaphragm. Central catheter tip in superior vena cava. No pneumothorax. There is a left pleural effusion with atelectasis in the left lower lung region. There is been significant interval clearing on the right with right lung essentially clear. Heart size and pulmonary vascularity are normal. Patient is status post median sternotomy. No adenopathy. No bone lesions. IMPRESSION: Tube and catheter positions as described without pneumothorax. Right lung now clear. There is a left pleural effusion with left base atelectasis. Stable cardiac silhouette. Electronically Signed   By: Lowella Grip III M.D.   On: 04/02/2021 08:03   DG CHEST PORT 1 VIEW  Result Date: 04/01/2021 CLINICAL DATA:  Intubation EXAM: PORTABLE CHEST 1 VIEW COMPARISON:  Portable exam 1445 hours compared to 04/01/2021 FINDINGS: Tip of endotracheal tube projects 5.3 cm above carina. Nasogastric tube extends into stomach. RIGHT jugular line with tip projecting over confluence of SVC. External pacing leads present. Normal heart size post median sternotomy. Bibasilar effusions and atelectasis greater on RIGHT. No pneumothorax or segmental infiltrate. IMPRESSION: Bibasilar pleural  effusions and atelectasis greater on RIGHT. Electronically Signed   By: Lavonia Dana M.D.   On: 04/01/2021 14:55   DG Abd Portable 1V  Result Date: 04/02/2021 CLINICAL DATA:  Status post feeding tube placement EXAM: PORTABLE ABDOMEN - 1 VIEW COMPARISON:  One day prior FINDINGS: Nasogastric tube has been removed. Feeding tube terminates at the distal stomach. Prior median sternotomy. Mild cardiomegaly. Small left pleural effusion. Grossly nonobstructive bowel gas pattern. IMPRESSION: Feeding tube terminating at the distal stomach. Electronically Signed   By: Abigail Miyamoto M.D.   On: 04/02/2021 12:40   EEG adult  Result Date: 04/01/2021 Lora Havens, MD     04/01/2021  6:01 PM Patient Name: [name]. MRN: [id number] Epilepsy Attending: Lora Havens Referring Physician/Provider: Dr Noemi Chapel Date: 04/01/2021 Duration: 27.16 mins  Patient history: 55yo M s/p cardiac arrest, ws improving but had an episode of sudden onset unresponsiveness. EEG to evaluate for seizure  Level of alertness: awake  AEDs during EEG study: None  Technical aspects: This EEG study was done with scalp electrodes positioned according to the 10-20 International system of electrode placement. Electrical activity was acquired at a sampling rate of '500Hz'  and reviewed with a high frequency filter of '70Hz'  and a low frequency filter of '1Hz' . EEG data were recorded continuously and digitally stored.  Description: EEG showed continuous generalized 5 to 6 Hz theta as well as intermittent 2-'3Hz'  delta slowing. Hyperventilation and photic stimulation were not performed.     ABNORMALITY - Continuous slow, generalized  IMPRESSION: This study is suggestive of moderate diffuse encephalopathy, nonspecific etiology. No seizures or epileptiform discharges were seen throughout the recording.    Lora Havens    ECHOCARDIOGRAM COMPLETE  Result Date: 04/01/2021    ECHOCARDIOGRAM REPORT   Patient Name:   Christon Parada. Date of Exam:  04/01/2021 Medical Rec #:  [id number]           Height:       74.0 in Accession #:    [id number]          Weight:       172.6 lb Date of Birth:  1966-04-27            BSA:          2.042 m Patient Age:    72 years            BP:  90/50 mmHg Patient Gender: M                   HR:           97 bpm. Exam Location:  Inpatient Procedure: 2D Echo, Color Doppler and Cardiac Doppler Indications:    CHF  History:        Patient has prior history of Echocardiogram examinations.  Sonographer:    NA Referring Phys: Dix  1. Left ventricular ejection fraction, by estimation, is 45 to 50%. The left ventricle has mildly decreased function. The left ventricle demonstrates regional wall motion abnormalities with basal to mid inferolateral severe hypokinesis. There is mild left ventricular hypertrophy. Left ventricular diastolic parameters are indeterminate due to atrial flutter.  2. Right ventricular systolic function is moderately reduced. The right ventricular size is mildly enlarged. Mildly increased right ventricular wall thickness.  3. Left atrial size was moderately dilated.  4. Right atrial size was mildly dilated.  5. The mitral valve is normal in structure. No evidence of mitral valve regurgitation. No evidence of mitral stenosis.  6. The aortic valve is tricuspid. Aortic valve regurgitation is not visualized. Mild aortic valve sclerosis is present, with no evidence of aortic valve stenosis.  7. The inferior vena cava is dilated in size with <50% respiratory variability, suggesting right atrial pressure of 15 mmHg. FINDINGS  Left Ventricle: Left ventricular ejection fraction, by estimation, is 45 to 50%. The left ventricle has mildly decreased function. The left ventricle demonstrates regional wall motion abnormalities. The left ventricular internal cavity size was normal in size. There is mild left ventricular hypertrophy. Left ventricular diastolic parameters are indeterminate. Right  Ventricle: The right ventricular size is mildly enlarged. Mildly increased right ventricular wall thickness. Right ventricular systolic function is moderately reduced. Left Atrium: Left atrial size was moderately dilated. Right Atrium: Right atrial size was mildly dilated. Pericardium: Trivial pericardial effusion is present. Mitral Valve: The mitral valve is normal in structure. There is mild calcification of the mitral valve leaflet(s). Mild mitral annular calcification. No evidence of mitral valve regurgitation. No evidence of mitral valve stenosis. Tricuspid Valve: The tricuspid valve is normal in structure. Tricuspid valve regurgitation is trivial. Aortic Valve: The aortic valve is tricuspid. Aortic valve regurgitation is not visualized. Mild aortic valve sclerosis is present, with no evidence of aortic valve stenosis. Aortic valve mean gradient measures 5.0 mmHg. Aortic valve peak gradient measures 8.4 mmHg. Aortic valve area, by VTI measures 2.41 cm. Pulmonic Valve: The pulmonic valve was normal in structure. Pulmonic valve regurgitation is not visualized. Aorta: The aortic root is normal in size and structure. Venous: The inferior vena cava is dilated in size with less than 50% respiratory variability, suggesting right atrial pressure of 15 mmHg. IAS/Shunts: No atrial level shunt detected by color flow Doppler.  LEFT VENTRICLE PLAX 2D LVIDd:         4.10 cm  Diastology LVIDs:         3.80 cm  LV e' medial:    8.16 cm/s LV PW:         0.70 cm  LV E/e' medial:  14.0 LV IVS:        1.30 cm  LV e' lateral:   7.51 cm/s LVOT diam:     2.10 cm  LV E/e' lateral: 15.2 LV SV:         53 LV SV Index:   26 LVOT Area:     3.46 cm  RIGHT VENTRICLE            IVC RV Basal diam:  3.60 cm    IVC diam: 2.70 cm RV Mid diam:    3.50 cm RV S prime:     6.05 cm/s TAPSE (M-mode): 1.0 cm LEFT ATRIUM             Index       RIGHT ATRIUM           Index LA diam:        2.90 cm 1.42 cm/m  RA Area:     28.70 cm LA Vol (A2C):   83.0  ml 40.65 ml/m RA Volume:   105.00 ml 51.43 ml/m LA Vol (A4C):   53.2 ml 26.06 ml/m LA Biplane Vol: 66.5 ml 32.57 ml/m  AORTIC VALVE AV Area (Vmax):    2.41 cm AV Area (Vmean):   2.10 cm AV Area (VTI):     2.41 cm AV Vmax:           145.00 cm/s AV Vmean:          100.000 cm/s AV VTI:            0.218 m AV Peak Grad:      8.4 mmHg AV Mean Grad:      5.0 mmHg LVOT Vmax:         101.00 cm/s LVOT Vmean:        60.700 cm/s LVOT VTI:          0.152 m LVOT/AV VTI ratio: 0.70  AORTA Ao Root diam: 3.00 cm Ao Asc diam:  2.80 cm MITRAL VALVE                TRICUSPID VALVE MV Area (PHT): 4.54 cm     TR Peak grad:   45.4 mmHg MV Decel Time: 167 msec     TR Vmax:        337.00 cm/s MV E velocity: 114.00 cm/s                             SHUNTS                             Systemic VTI:  0.15 m                             Systemic Diam: 2.10 cm Loralie Champagne MD Electronically signed by Loralie Champagne MD Signature Date/Time: 04/01/2021/2:37:18 PM    Final    CT HEAD CODE STROKE WO CONTRAST  Result Date: 04/01/2021 CLINICAL DATA:  Code stroke.  Neuro deficit, acute stroke suspected. EXAM: CT HEAD WITHOUT CONTRAST TECHNIQUE: Contiguous axial images were obtained from the base of the skull through the vertex without intravenous contrast. COMPARISON:  None. FINDINGS: Brain: No evidence of acute large vascular territory infarction, hemorrhage, hydrocephalus, extra-axial collection or mass lesion/mass effect. Small remote appearing lacunar infarcts in bilateral frontal lobe white matter. Evaluation of posterior fossa is limited by streak artifact. Vascular: No hyperdense vessel identified. Skull: No acute fracture. Sinuses/Orbits: Opacified anterior right ethmoid air cell with mild mucosal thickening of the ethmoid air cells. Otherwise clear sinuses. Unremarkable orbits. Other: No mastoid effusions. ASPECTS [facility] Stroke Program Early CT Score) total score (0-10 with 10 being normal): 10. IMPRESSION: 1. No evidence of acute large  vascular territory infarct or  acute hemorrhage. ASPECTS is 10. 2. Small remote appearing lacunar infarcts in bilateral frontal lobe white matter. An MRI could provide more sensitive evaluation for acute infarct if clinically indicated. Code stroke imaging results were communicated on 04/01/2021 at 2:06 pm to provider Dr. Quinn Axe via secure text paging. Electronically Signed   By: Margaretha Sheffield MD   On: 04/01/2021 14:07   [place] EKG SITE RITE  Result Date: 04/02/2021 If Site Rite image not attached, placement could not be confirmed due to current cardiac rhythm.  CT ANGIO HEAD CODE STROKE  Result Date: 04/01/2021 CLINICAL DATA:  Neuro deficit, acute stroke suspected. EXAM: CT ANGIOGRAPHY HEAD AND NECK TECHNIQUE: Multidetector CT imaging of the head and neck was performed using the standard protocol during bolus administration of intravenous contrast. Multiplanar CT image reconstructions and MIPs were obtained to evaluate the vascular anatomy. Carotid stenosis measurements (when applicable) are obtained utilizing NASCET criteria, using the distal internal carotid diameter as the denominator. CONTRAST:  35m OMNIPAQUE IOHEXOL 350 MG/ML SOLN COMPARISON:  None. FINDINGS: CTA NECK FINDINGS Aortic arch: Calcific atherosclerosis of the aorta. Great vessel origins are patent. Right carotid system: Predominately calcific atherosclerosis at the carotid bifurcation without greater than 50% narrowing. The internal carotid artery is small throughout its course without focal hemodynamically significant stenosis. Left carotid system: Predominately calcific atherosclerosis at the carotid bifurcation without greater than 50% narrowing. The internal carotid artery is small throughout its course without focal hemodynamically significant stenosis. Vertebral arteries: Severe stenosis of the right vertebral artery origin secondary to atherosclerosis. Skeleton: No evidence of acute abnormality on limited assessment. Other neck: Mild  diffuse soft tissue swelling. Upper chest: Please see concurrent CT chest for intrathoracic findings. Review of the MIP images confirms the above findings CTA HEAD FINDINGS Anterior circulation: Bilateral calcific and paraclinoid ICA calcific atherosclerosis without evidence of flow limiting stenosis. Bilateral MCAs and ACAs are patent without evidence of proximal hemodynamically significant stenosis. No aneurysm. Posterior circulation: Small left vertebral artery. Moderate left and mild right narrowing of the intradural vertebral arteries proximally due to calcific atherosclerosis. The basilar artery and bilateral posterior cerebral arteries are patent without proximal hemodynamically significant stenosis. No aneurysm. Venous sinuses: As permitted by contrast timing, patent. Review of the MIP images confirms the above findings IMPRESSION: CTA Head: 1. No large vessel occlusion. 2. 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 CTA Neck: 1. Severe atherosclerotic narrowing of the right vertebral artery origin. 2. Bilateral carotid bifurcation atherosclerosis without greater than 50% narrowing. 3. Small internal carotid arteries in the neck, nonspecific but potentially chronic/congenital. 4. Mild diffuse subcutaneous soft tissue swelling, suggestive of anasarca. 5. Please see concurrent CT chest for intrathoracic findings. Findings discussed with Dr. SFuller Planat 2:28 p.m. via telephone. Electronically Signed   By: FMargaretha SheffieldMD   On: 04/01/2021 14:40   CT ANGIO NECK CODE STROKE  Result Date: 04/01/2021 CLINICAL DATA:  Neuro deficit, acute stroke suspected. EXAM: CT ANGIOGRAPHY HEAD AND NECK TECHNIQUE: Multidetector CT imaging of the head and neck was performed using the standard protocol during bolus administration of intravenous contrast. Multiplanar CT image reconstructions and MIPs were obtained to evaluate the vascular anatomy. Carotid stenosis measurements (when applicable) are  obtained utilizing NASCET criteria, using the distal internal carotid diameter as the denominator. CONTRAST:  721mOMNIPAQUE IOHEXOL 350 MG/ML SOLN COMPARISON:  None. FINDINGS: CTA NECK FINDINGS Aortic arch: Calcific atherosclerosis of the aorta. Great vessel origins are patent. Right carotid system: Predominately calcific atherosclerosis at the carotid bifurcation  without greater than 50% narrowing. The internal carotid artery is small throughout its course without focal hemodynamically significant stenosis. Left carotid system: Predominately calcific atherosclerosis at the carotid bifurcation without greater than 50% narrowing. The internal carotid artery is small throughout its course without focal hemodynamically significant stenosis. Vertebral arteries: Severe stenosis of the right vertebral artery origin secondary to atherosclerosis. Skeleton: No evidence of acute abnormality on limited assessment. Other neck: Mild diffuse soft tissue swelling. Upper chest: Please see concurrent CT chest for intrathoracic findings. Review of the MIP images confirms the above findings CTA HEAD FINDINGS Anterior circulation: Bilateral calcific and paraclinoid ICA calcific atherosclerosis without evidence of flow limiting stenosis. Bilateral MCAs and ACAs are patent without evidence of proximal hemodynamically significant stenosis. No aneurysm. Posterior circulation: Small left vertebral artery. Moderate left and mild right narrowing of the intradural vertebral arteries proximally due to calcific atherosclerosis. The basilar artery and bilateral posterior cerebral arteries are patent without proximal hemodynamically significant stenosis. No aneurysm. Venous sinuses: As permitted by contrast timing, patent. Review of the MIP images confirms the above findings IMPRESSION: CTA Head: 1. No large vessel occlusion. 2. 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 CTA Neck: 1. Severe atherosclerotic narrowing of the  right vertebral artery origin. 2. Bilateral carotid bifurcation atherosclerosis without greater than 50% narrowing. 3. Small internal carotid arteries in the neck, nonspecific but potentially chronic/congenital. 4. Mild diffuse subcutaneous soft tissue swelling, suggestive of anasarca. 5. Please see concurrent CT chest for intrathoracic findings. Findings discussed with Dr. Fuller Plan at 2:28 p.m. via telephone. Electronically Signed   By: Margaretha Sheffield MD   On: 04/01/2021 14:40    aspirin  81 mg Per Tube Daily   atorvastatin  40 mg Per Tube Daily   bisacodyl  10 mg Oral Daily   Or   bisacodyl  10 mg Rectal Daily   chlorhexidine gluconate (MEDLINE KIT)  15 mL Mouth Rinse BID   Chlorhexidine Gluconate Cloth  6 each Topical Daily   darbepoetin (ARANESP) injection - NON-DIALYSIS  60 mcg Subcutaneous Q Thu-1800   docusate  200 mg Per Tube Daily   feeding supplement (PROSource TF)  45 mL Per Tube QID   insulin aspart  0-24 Units Subcutaneous Q4H   insulin glargine  10 Units Subcutaneous Daily   ipratropium-albuterol  3 mL Nebulization QID   mouth rinse  15 mL Mouth Rinse 10 times per day   midodrine  15 mg Per Tube TID WC   multivitamin  1 tablet Per Tube QHS   pantoprazole sodium  40 mg Per Tube Daily   polyethylene glycol  17 g Per Tube Daily   sodium chloride flush  10-40 mL Intracatheter Q12H   sodium chloride flush  3 mL Intravenous Q12H   Thrombi-Pad  1 each Topical Once    BMET    Component Value Date/Time   NA 133 (L) 04/03/2021 0021   K 4.1 04/03/2021 0021   CL 101 04/03/2021 0021   CO2 19 (L) 04/03/2021 0021   GLUCOSE 139 (H) 04/03/2021 0021   BUN 37 (H) 04/03/2021 0021   CREATININE 3.18 (H) 04/03/2021 0021   CALCIUM 6.7 (L) 04/03/2021 0021   GFRNONAA 22 (L) 04/03/2021 0021   CBC    Component Value Date/Time   WBC 82.3 (HH) 04/03/2021 0021   RBC 2.87 (L) 04/03/2021 0021   HGB 8.8 (L) 04/03/2021 0021   HCT 26.2 (L) 04/03/2021 0021   PLT 256 04/03/2021 0021   MCV 91.3  04/03/2021 0021   MCH 30.7 04/03/2021 0021   MCHC 33.6 04/03/2021 0021   RDW 20.1 (H) 04/03/2021 0021   LYMPHSABS 1.0 04/03/2021 0021   MONOABS 2.1 (H) 04/03/2021 0021   EOSABS 0.2 04/03/2021 0021   BASOSABS 0.0 04/03/2021 0021     Outpatient dialysis prescription: [facility] [phone] 4 Hours, 180 dialyzer, 2K/2.5 Ca, DFR 500, BFR 400 Use AVF; graft on inner arm, 15 guage Heparin loading dose 2000 and hourly dose of 1200 EDW 78.5 kg Last post weight 78 kg on 5/21 (went in at 78 and came out at 78 kg) Meds: epogen 2400 units each tx venofer 50 mg weekly Not on hectorol or calcitriol sensipar 30 mg three times a week with HD   Assessment/Plan:   Severe sepsis syndrome - on broad spectrum abx.  ID consulted.  Now on levophed, neo, and vasopressin per PCCM.  Possible new LLL PNA. ESRD - normally on [place] dialysis in [place], last HD 5/30, now on CRRT since 03/30/2021 following CABG complicated by PEA arrest and need for pressors.  Still remains on pressors however he is feeling better.   CRRT stopped 03/30/21 and temp HD catheter removed at that time. S/p left fem trialysis catheter placed 04/02/21 CRRT resumed 04/02/21 due to acute decompensation and profound hypotension. CRRT orders:  4K/2.5 Ca for all fluids, rate of prefilter 500 ml/hr, post-filter 300 ml/hr, dialysate 1500 ml/hr, UF rate 0-50 ml/hr, no heparin. CAD s/p CABG x 4 complicated by PEA arrest.  Currently stable Acute on chronic CHF with cardiogenic shock post CABG - currently on levophed, milrinone, and started on midodrine.  Ok to increase midodrine to 15 mg tid and wean levophed as tolerated. Repeat ECHO EF 45-50%, RV function moderately reduced. Pulmonary HTN - per HF team Cirrhosis - presumably cardiogenic.  S/p paracentesis on 03/18/21. 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  Hypophosphatemia - repleted with IV phos and follow.   Donetta Potts, MD [employer] [phone]

## 2021-04-03 NOTE — Plan of Care (Signed)
Neurology Plan of Care  Stroke code called 6/9 for unresponsiveness with fixed dilated pupils in the setting of what now appears to have been septic shock. After hypotension improved on pressors patient was able to follow commands on both sides even while intubated on fentanyl (interestingly, his left pupil remained fixed and dilated, while his right became 1m and sluggish). Acute unresponsiveness was 2/2 global hypoperfusion in the setting of severe hypotension. He is now nonfocal. 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   Neurology will not continue to actively follow. If patient has residual encephalopathy or other neuro deficits after tx of his acute medical issues, or if we can be of other assistance, please re-engage.  CSu Monks MD [facility] [phone] If 7pm- 7am, please page neurology on call as listed in [name]

## 2021-04-03 NOTE — Progress Notes (Signed)
NAME:  David Fludd., MRN:  [id number], DOB:  06-23-1966, LOS: 9 ADMISSION DATE:  02/25/2021, CONSULTATION DATE:  03/30/2021 REFERRING MD:  TCTS CHIEF COMPLAINT: Code   History of Present Illness:  55 year old man with PMHx significant for HTN, T2DM, PAD, ESRD, cirrhosis (query cardiac etiology) who presented for symptomatic ischemic cardiomyopathy.    Patient underwent preoperative optimization with CHF, GI, and Nephrology teams with eventual CABG 5/31. On 6/1, patient developed respiratory distress followed by PEA arrest requiring CPR. Code was c/b increased bleeding around surgical sites, prompting mediastinal exploration 6/1. Intraoperative findings were notable for bleeding around mammary graft site and diffuse oozing for which patient received multiple blood products.   Patient remained intubated on pressors postoperatively and PCCM was consulted for assistance with management.  Pertinent Medical History:  ESRD on HD Cardiac cirrhosis with portal hypertension IBS IDDM  Significant Hospital Events: Including procedures, antibiotic start and stop dates in addition to other pertinent events   5/23 Admitted 5/26 Paracentesis 4L 5/30 HD 5/31 CABG 5/31- 6/1 Code Blue, OR for mediastinal exploration 2/2 hemorrhagic shock induced by CPR. Factor 7, protamine, FFP, cryo given 6/1 PCCM consult, 4U PRBCs and 1U Plt . R pigtail chest tube placed due to limitation of vent weaning. 6/2 Extubated, chest tube 3.2L out past 24 hrs 6/3 Remains on 2L [place], chest tube with 500cc out/ 24 hrs 6/4 Remains on CRRT, 2L [place], milrinone, NE. 3 chest tubes remain. 6/6 Remains on CRRT, 4 chest tubes in place 6/9 Decreasing pressor requirements and CT output. CRRT transitioned to iHD. Echo repeated. Prior to HD session, patient suddenly became unresponsive with fixed and dilated pupils, minimal gag, breathing spontaneously. Code Stroke called. Reintubated, heparin d/c'ed, STAT CT Head/CTA. 6/10, WBC jump to 89  K, Temp of 102,Remains on 40 of Levo, 40 of Neo, and Vaso at 0.03.Pupils are 6 and non-responsive to light. Trialysis cath inserted for CVVHD. R IJ is 55 days old, will get PICC and discontinue. Lactate of 2.8   Interim History / Subjective:   Patient remains critically ill intubated mechanical life support on CVVHD  Objective   Blood pressure 107/63, pulse 100, temperature (!) 97.5 F (36.4 C), temperature source Axillary, resp. rate (!) 27, height '6\' 2"'  (1.88 m), weight 75.7 kg, SpO2 100 %. 4LNC    Vent Mode: PRVC FiO2 (%):  [40 %-70 %] 40 % Set Rate:  [34 bmp] 34 bmp Vt Set:  [570 mL] 570 mL PEEP:  [8 cmH20-10 cmH20] 8 cmH20 Plateau Pressure:  [27 cmH20-30 cmH20] 27 cmH20   Intake/Output Summary (Last 24 hours) at 04/03/2021 0754 Last data filed at 04/03/2021 0700 Gross per 24 hour  Intake 3501.72 ml  Output 2854 ml  Net 647.72 ml   Filed Weights   03/31/21 0647 04/01/21 0500 04/02/21 0452  Weight: 75.8 kg 78.3 kg 75.7 kg   Physical Examination: General: Middle-age male, appears older than stated age intubated on mechanical life support critically ill HEENT: NCAT, sclera clear, endotracheal tube in place Neuro: Sedated on mechanical life support, response to pain CV: Regular rate rhythm, S1-S2 PULM: Bilateral ventilated breath sounds, no crackles no wheeze, diminished in the bases GI: Soft, nontender nondistended Extremities: Trace dependent edema lower extremities and upper extremities Skin: Warm dry no rash  Labs/imaging that I have personally reviewed: (right click and "Reselect all SmartList Selections" daily)   Labs reviewed Lactate level remains persistently elevated. White blood cell count 82,000 It is coming down however Hemoglobin 8.8  Resolved Hospital Problem list     Assessment & Plan:   Acute encephalopathy prompting Code Stroke Patient became acutely unresponsive 6/9 with fixed and dilated pupils, minimal gag, breathing spontaneously. Unclear what  caused this episode.  Work-up as so far been unrevealing Plan: Remains on mechanical support Continue to lighten sedation is much as possible Still not following commands but remains on a lot of support.   Acute hypoxemic respiratory failure - improved Bilateral pleural effusions;  inferior R sided pigtail tube no longer in place, removed 6/10; surgical CT removed 6/7 Likely LLL HAP Plan: Postop care per thoracic surgery Remains on adult mechanical vent protocol Wean PEEP and FiO2 to maintain sats above 90% SBT SAT as tolerated VAP bundle PAD guidelines sedation. CT abdomen pelvis still has some small pleural effusions bilaterally. Chest x-rays stable tubes and lines reviewed today.  New Leukocytosis 6/10 Fever to 102 Plan Patient was cultured Started on broad-spectrum antibiotics Started antifungal yesterday No evidence of abscess Patient has all lines removed, Foley removed/exchanged New temporary dialysis catheter placed in groin.  Post-op mediastinal hemorrhage Cirrhotic and hemorrhage- induced consumptive coagulopathy Hemorrhagic shock, resolved Acute blood loss anemia (improving) Thrombocytopenia- (Improving): in setting of blood loss, coagulopathy, and cirrhosis Elevated Alk Phos/AST P: Follow CBC Transfusion threshold for hemoglobin less than 7 Monitor for any obvious signs of bleeding  In hospital cardiac arrest precipitated by respiratory distress- PEA, minimal downtime. 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  ESRD on HD PTA: known IJ stenosis and concern of R IJ stenosis; difficult access, RUE AVF Trialysis catheter placed 6/10 Plan: Continue CVVHD per  nephrology Follow BMP Follow I's and O's Avoid nephrotoxic agents  DM2, controlled CBG's low overnight CBGs with SSI   Best practice (right click and "Reselect all SmartList Selections" daily)  Diet:  NPO>> Tube feeds Pain/Anxiety/Delirium protocol (if indicated): Yes (RASS goal 0) VAP protocol (if indicated): Yes DVT prophylaxis: Systemic AC - discontinued at the time of Code Stroke GI prophylaxis: PPI Glucose control:  SSI Yes Central venous access:  Yes, and it is still needed Arterial line:  N/A  Foley:  N/A Mobility:  bed rest  PT consulted: Yes Last date of multidisciplinary goals of care discussion [briefly 6/9 in the setting of Code Stroke] Code Status:  full code Disposition: per primary  This patient is critically ill with multiple organ system failure; which, requires frequent high complexity decision making, assessment, support, evaluation, and titration of therapies. This was completed through the application of advanced monitoring technologies and extensive interpretation of multiple databases. During this encounter critical care time was devoted to patient care services described in this note for 32 minutes.  Garner Nash, DO [facility] 04/03/2021 7:54 AM

## 2021-04-03 NOTE — Progress Notes (Signed)
Patient ID: David Vacca., male   DOB: June 19, 1966, 55 y.o.   MRN: [id number] P    Advanced Heart Failure Rounding Note  PCP-Cardiologist: Carlyle Dolly, MD    Patient Profile   55 y/o male w/  history of   ESRD due to DM and CHF with mid-range EF (LV EF 40-45% with moderately decreased RV systolic function and PASP 99 on 4/22 echo) as well as cirrhosis of uncertain etiology.  Based on low EF and elevated PA pressure, right and left heart cath was done in 5/22.  This showed severe 3VD, high cardiac output with moderate pulmonary hypertension but low PVR.  He is planned for CABG, but admitted pre-op for optimization.    Subjective:    CABG x 4 on 5/31 with LIMA-LAD, SVG-RCA, SVG-OM, SVG-D.   Coagulopathic post-op with multiple blood products.  Extubated post-op but developed respiratory distress => PEA arrest then VT. Reintubated.  ROSC with ACLS.  Returned to OR for mediastinal exploration 6/1.  Extubated 6/2.   6/6 Midodrine started.   6/9 AMS--> CT negative for acute bleed or ischemic CVA. Intubated. Suspected septic shock.  CTA chest showed no PE, moderate bilateral pleural effusions. Re-intubated and CVVH restarted. CT abdomen/pelvis: anasarca, bilateral pleural effusions and lung base opacities, thickened colon/terminal ileum (?due to cirrhosis, ?ileocolitis).   On Norepi 20 mcg + Neo 5 mcg + vaso 0.03 units. MAP now stable.  Last lactate 2.6 at midnight.  CVVH ongoing, pulling slightly negative.   On Cefepime, vancomycin, Flagyl for HCAP, WBCs 88 => 82.   Sedated/Intubated. Per nursing, he will awaken and follow commands though pupils remain dilated and unresponsive to light.   He remains in atrial flutter, on heparin gtt and amiodarone.    Echo (6/9) with EF 45-50%, inferolateral severe hypokinesis, moderate RV dysfunction.    Objective:   Weight Range: 75.7 kg Body mass index is 21.43 kg/m.   Vital Signs:   Temp:  [97.5 F (36.4 C)-101.2 F (38.4 C)] 97.5 F (36.4  C) (06/11 0400) Pulse Rate:  [99-115] 100 (06/11 0600) Resp:  [23-34] 27 (06/11 0600) BP: (82-136)/(16-89) 107/63 (06/11 0600) SpO2:  [99 %-100 %] 100 % (06/11 0600) FiO2 (%):  [40 %-70 %] 40 % (06/11 0325) Last BM Date: 03/31/21  Weight change: Filed Weights   03/31/21 0647 04/01/21 0500 04/02/21 0452  Weight: 75.8 kg 78.3 kg 75.7 kg    Intake/Output:   Intake/Output Summary (Last 24 hours) at 04/03/2021 0738 Last data filed at 04/03/2021 0600 Gross per 24 hour  Intake 3336.15 ml  Output 2741 ml  Net 595.15 ml      Physical Exam   General: Intubated, eyes open.  Neck: JVP 14+, no thyromegaly or thyroid nodule.  Lungs: Decreased at bases.  CV: Nondisplaced PMI.  Heart regular S1/S2, no S3/S4, no murmur.  Anasarca.   Abdomen: Soft, nontender, no hepatosplenomegaly, no distention.  Skin: Intact without lesions or rashes.  Neurologic: Pupils dilated and not responsive to light.  Extremities: No clubbing or cyanosis.  HEENT: Normal.    Telemetry   Atypical flutter 100s. Personally reviewed.   EKG    N/A  Labs    CBC Recent Labs    04/01/21 1328 04/01/21 1526 04/02/21 1713 04/03/21 0021  WBC 28.1*   < > 88.4* 82.3*  NEUTROABS 21.1*  --   --  75.2*  HGB 9.9*   < > 9.8* 8.8*  HCT 33.2*   < > 29.3* 26.2*  MCV 100.0   < >  91.8 91.3  PLT 326   < > 286 256   < > = values in this interval not displayed.   Basic Metabolic Panel Recent Labs    04/02/21 1713 04/03/21 0021  NA 132* 133*  K 4.5 4.1  CL 98 101  CO2 21* 19*  GLUCOSE 132* 139*  BUN 42* 37*  CREATININE 3.77* 3.18*  CALCIUM 7.2* 6.7*  MG 2.2 2.1  PHOS 3.8 3.6   Liver Function Tests Recent Labs    04/02/21 0344 04/02/21 1347 04/03/21 0021  AST 44*  --  30  ALT 23  --  19  ALKPHOS 205*  --  200*  BILITOT 1.8*  --  2.3*  PROT 5.6*  --  5.3*  ALBUMIN 2.2* 2.1* 2.0*   No results for input(s): LIPASE, AMYLASE in the last 72 hours. Cardiac Enzymes No results for input(s): CKTOTAL, CKMB,  CKMBINDEX, TROPONINI in the last 72 hours.  BNP: BNP (last 3 results) Recent Labs    03/20/2021 1545  BNP 1,127.8*    ProBNP (last 3 results) No results for input(s): PROBNP in the last 8760 hours.   D-Dimer No results for input(s): DDIMER in the last 72 hours.  Hemoglobin A1C No results for input(s): HGBA1C in the last 72 hours. Fasting Lipid Panel No results for input(s): CHOL, HDL, LDLCALC, TRIG, CHOLHDL, LDLDIRECT in the last 72 hours. Thyroid Function Tests No results for input(s): TSH, T4TOTAL, T3FREE, THYROIDAB in the last 72 hours.  Invalid input(s): FREET3  Other results:   Imaging    CT ABDOMEN PELVIS WO CONTRAST  Result Date: 04/03/2021 CLINICAL DATA:  Right upper quadrant pain, leukocytosis, Murphy sign EXAM: CT ABDOMEN AND PELVIS WITHOUT CONTRAST TECHNIQUE: Multidetector CT imaging of the abdomen and pelvis was performed following the standard protocol without IV contrast. COMPARISON:  CTa chest 04/01/2021 FINDINGS: Lower chest: Multifocal patchy ground-glass and consolidative opacities in the lung bases with partial atelectatic collapse of both lower lobes. Bilateral pleural effusions as well including some questionable loculation of the left effusion. Borderline cardiomegaly. 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 Postsurgical changes from prior sternotomy. Hepatobiliary: Lobular hepatic surface contour. Hypertrophy of the left lobe and caudate. No concerning focal liver lesion is seen. Few vascular calcifications. Hyperdense material layering within the moderately distended gallbladder, possible vicarious extravasation of contrast from recent CT imaging versus layering biliary sludge. No discernible gallstones. No biliary ductal dilatation. Pancreas: Extensive fatty replacement of the pancreas. No pancreatic ductal dilatation or surrounding inflammatory changes. Spleen: Punctate calcifications  throughout the spleen likely reflecting granulomata. Normal splenic size. No concerning focal splenic lesion. Adrenals/Urinary Tract: Normal adrenals. Moderate bilateral perinephric stranding is symmetric and nonspecific. Appearance is similar to comparison CT imaging extensive vascular calcifications. No hydronephrosis or evidence of obstructive uropathy. Hyperdense material layering within the partially decompressed urinary bladder likely reflecting excreted contrast media. Bladder wall thickening is nonspecific given underdistention. Stomach/Bowel: Transesophageal tube tip and side port distal to the GE junction, terminating near the gastric antrum. Mild mural thickening and stranding about the terminal ileum as well as diffusely throughout the colonic segments. No evidence of bowel obstruction. Normal caliber appendix in the right quadrant. Vascular/Lymphatic: Extensive atherosclerotic calcification of the abdominal aorta and branch vessels. No aneurysm or ectasia. No other gross complication limitations of an unenhanced CT. Reproductive: Coarse eccentric calcification of the prostate. No concerning abnormalities of the prostate or seminal vesicles. High-riding appearance of the bilateral testes with small hydroceles. Other: Small  moderate volume ascites particularly in the right upper quadrant and layering in the deep pelvis pericolic gutter. Extensive circumferential body wall edema. No free intraperitoneal air. Fat and fluid containing left paraumbilical hernia. No bowel containing hernia. Musculoskeletal: No acute osseous abnormality or suspicious osseous lesion. Multilevel degenerative changes are present in the imaged portions of the spine. Posterior spurring and calcified disc bulge L5-S1 resulting in severe canal stenosis and bilateral foraminal narrowing. IMPRESSION: 1. Stigmata of cirrhosis with a nodular liver surface contour as well as hypertrophy of the left lobe and caudate. 2. Features of  developing anasarca with moderate ascites and extensive circumferential body wall edema as well as bilateral pleural effusions. 3. Moderate distention of the gallbladder with layering hyperdense material which could reflect vicarious extravasation of contrast given recent contrast enhanced exams. No gallbladder wall thickening or biliary ductal dilatation is seen however if there is persisting clinical concern, could consider right upper quadrant ultrasound. 4. Circumferential thickening of the terminal ileum and diffusely throughout the colon, nonspecific in the setting of intrinsic liver disease. Could reflect portal enteropathy/colopathy versus ileocolitis in the appropriate clinical setting. 5. Patchy opacities in bilateral lung bases as well as densely atelectatic bilateral lower lobes, could reflect a combination of atelectasis, underlying airspace disease/infection and/or edema. Bilateral effusions with some questionable loculation of the left effusion as well. 6. Mild cardiomegaly with three-vessel coronary disease in postsurgical changes from sternotomy and CABG. Electronically Signed   By: Lovena Le M.D.   On: 04/03/2021 00:24   DG Abd Portable 1V  Result Date: 04/02/2021 CLINICAL DATA:  Status post feeding tube placement EXAM: PORTABLE ABDOMEN - 1 VIEW COMPARISON:  One day prior FINDINGS: Nasogastric tube has been removed. Feeding tube terminates at the distal stomach. Prior median sternotomy. Mild cardiomegaly. Small left pleural effusion. Grossly nonobstructive bowel gas pattern. IMPRESSION: Feeding tube terminating at the distal stomach. Electronically Signed   By: Abigail Miyamoto M.D.   On: 04/02/2021 12:40   [place] EKG SITE RITE  Result Date: 04/02/2021 If Site Rite image not attached, placement could not be confirmed due to current cardiac rhythm.     Medications:     Scheduled Medications:  aspirin  81 mg Per Tube Daily   atorvastatin  40 mg Per Tube Daily   bisacodyl  10 mg Oral  Daily   Or   bisacodyl  10 mg Rectal Daily   chlorhexidine gluconate (MEDLINE KIT)  15 mL Mouth Rinse BID   Chlorhexidine Gluconate Cloth  6 each Topical Daily   darbepoetin (ARANESP) injection - NON-DIALYSIS  60 mcg Subcutaneous Q Thu-1800   docusate  200 mg Per Tube Daily   feeding supplement (PROSource TF)  45 mL Per Tube QID   insulin aspart  0-24 Units Subcutaneous Q4H   insulin glargine  10 Units Subcutaneous Daily   ipratropium-albuterol  3 mL Nebulization QID   mouth rinse  15 mL Mouth Rinse 10 times per day   multivitamin  1 tablet Per Tube QHS   pantoprazole sodium  40 mg Per Tube Daily   polyethylene glycol  17 g Per Tube Daily   sodium chloride flush  10-40 mL Intracatheter Q12H   sodium chloride flush  3 mL Intravenous Q12H   Thrombi-Pad  1 each Topical Once    Infusions:   prismasol BGK 4/2.5 500 mL/hr at 04/03/21 0651    prismasol BGK 4/2.5 300 mL/hr at 04/03/21 0651   sodium chloride     sodium chloride Stopped (04/02/21 2123)  amiodarone 30 mg/hr (04/03/21 0600)   anidulafungin     ceFEPime (MAXIPIME) IV Stopped (04/02/21 1744)   feeding supplement (VITAL 1.5 CAL) 1,000 mL (04/02/21 1323)   fentaNYL infusion INTRAVENOUS 200 mcg/hr (04/03/21 0600)   heparin 1,950 Units/hr (04/03/21 0600)   lactated ringers     lactated ringers Stopped (04/01/21 1902)   metronidazole 100 mL/hr at 04/03/21 0600   norepinephrine (LEVOPHED) Adult infusion 20 mcg/min (04/03/21 0600)   phenylephrine (NEO-SYNEPHRINE) Adult infusion 5 mcg/min (04/03/21 0600)   prismasol BGK 4/2.5 1,500 mL/hr at 04/03/21 0651   vasopressin 0.03 Units/min (04/03/21 0600)    PRN Medications: heparin, lactated ringers, lidocaine (PF), lip balm, metoprolol tartrate, morphine injection, ondansetron (ZOFRAN) IV, oxyCODONE, sodium chloride, sodium chloride flush, sodium chloride flush     Assessment/Plan   1. CAD: Severe 3VD with decreased EF.  I reviewed films with Dr. Ellyn Hack, PCI would be possible  but would be difficult/high risk with multiple lesions and heavy calcification. 5/31 LIMA-LAD, SVG-RCA, SVG-OM, SVG-D. - ASA 81 + atorvastatin 40 mg daily.  2. Acute/chronic HF with mid range EF => cardiogenic shock post-CABG: Suspect ischemic cardiomyopathy.  Echo 4/22 with EF 40-45% with moderately decreased RV systolic function and PASP 99, moderate TR. There was a prominent component of RV failure with RA pressure elevated out of proportion to PCWP (CVP/PCWP 0.875 on RHC) but PAPI adequate at 2.8. Post-op had PEA arrest then VT.  On 6/9, episode of altered mental status, progressive hypotension, and intubated.  Suspect septic shock now.  Echo (6/9) with EF 45-50%, inferolateral severe hypokinesis, moderate RV dysfunction. On NE 20 mcg + Neo 5 mcg + vasopressin 0.03 units (coming down).  Marked volume overload/anasarca.  - CVVH ongoing, will try to pull net UF negative 50-100 cc/hr today.  - Wean pressors as able.  - Can restart midodrine through tube.  3. ESRD: Suspected due to diabetes.  Volume up post-op with multiple blood products.  - CVVH ongoing, will aim for net negative UF 50-100 cc/hr. .  4. 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  The CO was not markedly high.  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 Oxygen saturation was 99% on RA pre-op, so no evidence for hepatopulmonary syndrome.   - Sildenafil has been on hold with pressors.  5. Cirrhosis: Noted on abdominal US from 2020.  H/o paracentesis.  Had workup at [facility] (though patient does not remember this), viral hepatitis labs negative.  They ended up think that the cirrhosis was cardiogenic.  Repeat abdominal US 5/23 w/ nodular hepatic parenchymal pattern with increased echogenicity consistent with the patient's known cirrhosis. No focal hepatic abnormality identified. Portal vein is patent. Moderate ascites. GI has seen,  patient had 4L paracentesis on 5/26. - He may eventually need repeat paracentesis.  6. Coagulopathy: Post-op bleeding in setting of coagulopathy post-CABG in patient with cirrhosis.  Currently seems controlled.  Platelets dropped then recovered, doubt HIT.  7. VT: Had after PEA arrest am 6/1.  - IV amiodarone at this time.   8. Anemia: ABLA.  Stable today. Hgb stable. 9. Pleural effusions: Right > left effusion with chest tube, ?hepatic hydrothorax.       10. PEA arrest: Respiratory arrest post-extubation.  ROSC with CPR.  11. Acute hypoxemic respiratory failure:  Extubated 6/2, re-intubated 6/9.  - Stable on vent, per CCM.  12. Atrial fibrillation: Atypical atrial flutter currently.   - Continue amio  drip - Heparin gtt - Eventual DCCV.  13. ID: Vancomycin/cefepime/Flagyl.   Suspected HCAP.  Septic shock.  WBC 88=>85. Now afebrile. Cultures NGTD.  - ID following, continue broad spectrum abx.  14. Neuro: Altered mental status 6/9 with intubation.  CT head with no bleed or ischemic CVA seen.  EEG with diffuse encephalopathy.  Pupils remain dilated and not responsive to light but he follows some commands.    CRITICAL CARE Performed by: Loralie Champagne  Total critical care time: 40 minutes  Critical care time was exclusive of separately billable procedures and treating other patients.  Critical care was necessary to treat or prevent imminent or life-threatening deterioration.  Critical care was time spent personally by me on the following activities: development of treatment plan with patient and/or surrogate as well as nursing, discussions with consultants, evaluation of patient's response to treatment, examination of patient, obtaining history from patient or surrogate, ordering and performing treatments and interventions, ordering and review of laboratory studies, ordering and review of radiographic studies, pulse oximetry and re-evaluation of patient's condition.  Loralie Champagne 04/03/2021 7:38 AM

## 2021-04-04 ENCOUNTER — Inpatient Hospital Stay (HOSPITAL_COMMUNITY): Payer: Medicare Other

## 2021-04-04 LAB — LACTIC ACID, PLASMA: Lactic Acid, Venous: 1.8 mmol/L (ref 0.5–1.9)

## 2021-04-04 LAB — CBC WITH DIFFERENTIAL/PLATELET
Abs Immature Granulocytes: 2.77 10*3/uL — ABNORMAL HIGH (ref 0.00–0.07)
Basophils Absolute: 0.3 10*3/uL — ABNORMAL HIGH (ref 0.0–0.1)
Basophils Relative: 0 %
Eosinophils Absolute: 0.5 10*3/uL (ref 0.0–0.5)
Eosinophils Relative: 1 %
HCT: 27 % — ABNORMAL LOW (ref 39.0–52.0)
Hemoglobin: 9.1 g/dL — ABNORMAL LOW (ref 13.0–17.0)
Immature Granulocytes: 4 %
Lymphocytes Relative: 2 %
Lymphs Abs: 1.3 10*3/uL (ref 0.7–4.0)
MCH: 30 pg (ref 26.0–34.0)
MCHC: 33.7 g/dL (ref 30.0–36.0)
MCV: 89.1 fL (ref 80.0–100.0)
Monocytes Absolute: 1.9 10*3/uL — ABNORMAL HIGH (ref 0.1–1.0)
Monocytes Relative: 3 %
Neutro Abs: 64.6 10*3/uL — ABNORMAL HIGH (ref 1.7–7.7)
Neutrophils Relative %: 90 %
Platelets: 291 10*3/uL (ref 150–400)
RBC: 3.03 MIL/uL — ABNORMAL LOW (ref 4.22–5.81)
RDW: 20.8 % — ABNORMAL HIGH (ref 11.5–15.5)
WBC: 71.3 10*3/uL (ref 4.0–10.5)
nRBC: 0 % (ref 0.0–0.2)

## 2021-04-04 LAB — RENAL FUNCTION PANEL
Albumin: 2 g/dL — ABNORMAL LOW (ref 3.5–5.0)
Anion gap: 9 (ref 5–15)
BUN: 16 mg/dL (ref 6–20)
CO2: 24 mmol/L (ref 22–32)
Calcium: 7.9 mg/dL — ABNORMAL LOW (ref 8.9–10.3)
Chloride: 104 mmol/L (ref 98–111)
Creatinine, Ser: 1.56 mg/dL — ABNORMAL HIGH (ref 0.61–1.24)
GFR, Estimated: 52 mL/min — ABNORMAL LOW (ref >60)
Glucose, Bld: 100 mg/dL — ABNORMAL HIGH (ref 70–99)
Phosphorus: 1.8 mg/dL — ABNORMAL LOW (ref 2.5–4.6)
Potassium: 4.1 mmol/L (ref 3.5–5.1)
Sodium: 137 mmol/L (ref 135–145)

## 2021-04-04 LAB — GLUCOSE, CAPILLARY
Glucose-Capillary: 104 mg/dL — ABNORMAL HIGH (ref 70–99)
Glucose-Capillary: 106 mg/dL — ABNORMAL HIGH (ref 70–99)
Glucose-Capillary: 122 mg/dL — ABNORMAL HIGH (ref 70–99)
Glucose-Capillary: 156 mg/dL — ABNORMAL HIGH (ref 70–99)
Glucose-Capillary: 92 mg/dL (ref 70–99)
Glucose-Capillary: 94 mg/dL (ref 70–99)
Glucose-Capillary: 94 mg/dL (ref 70–99)
Glucose-Capillary: 96 mg/dL (ref 70–99)

## 2021-04-04 LAB — COMPREHENSIVE METABOLIC PANEL
ALT: 24 U/L (ref 0–44)
AST: 40 U/L (ref 15–41)
Albumin: 2.1 g/dL — ABNORMAL LOW (ref 3.5–5.0)
Alkaline Phosphatase: 198 U/L — ABNORMAL HIGH (ref 38–126)
Anion gap: 11 (ref 5–15)
BUN: 22 mg/dL — ABNORMAL HIGH (ref 6–20)
CO2: 22 mmol/L (ref 22–32)
Calcium: 7.6 mg/dL — ABNORMAL LOW (ref 8.9–10.3)
Chloride: 102 mmol/L (ref 98–111)
Creatinine, Ser: 1.92 mg/dL — ABNORMAL HIGH (ref 0.61–1.24)
GFR, Estimated: 41 mL/min — ABNORMAL LOW (ref >60)
Glucose, Bld: 111 mg/dL — ABNORMAL HIGH (ref 70–99)
Potassium: 4.4 mmol/L (ref 3.5–5.1)
Sodium: 135 mmol/L (ref 135–145)
Total Bilirubin: 2.1 mg/dL — ABNORMAL HIGH (ref 0.3–1.2)
Total Protein: 5.7 g/dL — ABNORMAL LOW (ref 6.5–8.1)

## 2021-04-04 LAB — HEPARIN LEVEL (UNFRACTIONATED)
Heparin Unfractionated: 0.11 IU/mL — ABNORMAL LOW (ref 0.30–0.70)
Heparin Unfractionated: 0.17 IU/mL — ABNORMAL LOW (ref 0.30–0.70)
Heparin Unfractionated: 0.11 IU/mL — ABNORMAL LOW (ref 0.30–0.70)

## 2021-04-04 LAB — MAGNESIUM: Magnesium: 2.4 mg/dL (ref 1.7–2.4)

## 2021-04-04 MED ORDER — METOCLOPRAMIDE HCL 5 MG/ML IJ SOLN
10.0000 mg | Freq: Three times a day (TID) | INTRAMUSCULAR | Status: AC
  Administered 2021-04-04 – 2021-04-05 (×6): 10 mg via INTRAVENOUS
  Filled 2021-04-04 (×6): qty 2

## 2021-04-04 MED ORDER — ROCURONIUM BROMIDE 10 MG/ML (PF) SYRINGE
PREFILLED_SYRINGE | INTRAVENOUS | Status: AC
  Filled 2021-04-04: qty 10

## 2021-04-04 MED ORDER — SODIUM CHLORIDE 0.9 % IV SOLN
1.0000 g | Freq: Once | INTRAVENOUS | Status: AC
  Administered 2021-04-04: 1 g via INTRAVENOUS
  Filled 2021-04-04: qty 1

## 2021-04-04 MED ORDER — FENTANYL CITRATE (PF) 100 MCG/2ML IJ SOLN
INTRAMUSCULAR | Status: AC
  Filled 2021-04-04: qty 2

## 2021-04-04 MED ORDER — MIDAZOLAM HCL 2 MG/2ML IJ SOLN
INTRAMUSCULAR | Status: AC
  Filled 2021-04-04: qty 2

## 2021-04-04 MED ORDER — SODIUM CHLORIDE 0.9 % IV SOLN
2.0000 g | Freq: Two times a day (BID) | INTRAVENOUS | Status: AC
  Administered 2021-04-04 – 2021-04-08 (×9): 2 g via INTRAVENOUS
  Filled 2021-04-04 (×9): qty 2

## 2021-04-04 MED ORDER — ETOMIDATE 2 MG/ML IV SOLN
INTRAVENOUS | Status: AC
  Administered 2021-04-04: 20 mg
  Filled 2021-04-04: qty 20

## 2021-04-04 MED ORDER — EPINEPHRINE 1 MG/10ML IJ SOSY
PREFILLED_SYRINGE | INTRAMUSCULAR | Status: AC
  Filled 2021-04-04: qty 10

## 2021-04-04 NOTE — Progress Notes (Signed)
NAME:  David Rumple., MRN:  [id number], DOB:  1966-07-04, LOS: 43 ADMISSION DATE:  03/10/2021, CONSULTATION DATE:  04/18/2021 REFERRING MD:  TCTS CHIEF COMPLAINT: Code   History of Present Illness:  55 year old man with PMHx significant for HTN, T2DM, PAD, ESRD, cirrhosis (query cardiac etiology) who presented for symptomatic ischemic cardiomyopathy.    Patient underwent preoperative optimization with CHF, GI, and Nephrology teams with eventual CABG 5/31. On 6/1, patient developed respiratory distress followed by PEA arrest requiring CPR. Code was c/b increased bleeding around surgical sites, prompting mediastinal exploration 6/1. Intraoperative findings were notable for bleeding around mammary graft site and diffuse oozing for which patient received multiple blood products.   Patient remained intubated on pressors postoperatively and PCCM was consulted for assistance with management.  Pertinent Medical History:  ESRD on HD Cardiac cirrhosis with portal hypertension IBS IDDM  Significant Hospital Events: Including procedures, antibiotic start and stop dates in addition to other pertinent events   5/23 Admitted 5/26 Paracentesis 4L 5/30 HD 5/31 CABG 5/31- 6/1 Code Blue, OR for mediastinal exploration 2/2 hemorrhagic shock induced by CPR. Factor 7, protamine, FFP, cryo given 6/1 PCCM consult, 4U PRBCs and 1U Plt . R pigtail chest tube placed due to limitation of vent weaning. 6/2 Extubated, chest tube 3.2L out past 24 hrs 6/3 Remains on 2L [place], chest tube with 500cc out/ 24 hrs 6/4 Remains on CRRT, 2L [place], milrinone, NE. 3 chest tubes remain. 6/6 Remains on CRRT, 4 chest tubes in place 6/9 Decreasing pressor requirements and CT output. CRRT transitioned to iHD. Echo repeated. Prior to HD session, patient suddenly became unresponsive with fixed and dilated pupils, minimal gag, breathing spontaneously. Code Stroke called. Reintubated, heparin d/c'ed, STAT CT Head/CTA. 6/10, WBC jump to 89  K, Temp of 102,Remains on 40 of Levo, 40 of Neo, and Vaso at 0.03.Pupils are 6 and non-responsive to light. Trialysis cath inserted for CVVHD. R IJ is 79 days old, will get PICC and discontinue. Lactate of 2.8 6/12 coming down on pressors? Extubate in 24/48 hours    Interim History / Subjective:   Patient remains critically ill intubated on mechanical life support, pressors and CVVHD  Objective   Blood pressure 120/61, pulse 98, temperature 98.5 F (36.9 C), temperature source Oral, resp. rate (!) 34, height '6\' 2"'  (1.88 m), weight 75.7 kg, SpO2 99 %. 4LNC    Vent Mode: PRVC FiO2 (%):  [40 %] 40 % Set Rate:  [34 bmp] 34 bmp Vt Set:  [570 mL] 570 mL PEEP:  [5 cmH20-8 cmH20] 5 cmH20 Plateau Pressure:  [22 cmH20-26 cmH20] 25 cmH20   Intake/Output Summary (Last 24 hours) at 04/04/2021 1006 Last data filed at 04/04/2021 1000 Gross per 24 hour  Intake 2745.9 ml  Output 6347 ml  Net -3601.1 ml   Filed Weights   03/31/21 0647 04/01/21 0500 04/02/21 0452  Weight: 75.8 kg 78.3 kg 75.7 kg   Physical Examination: General: Middle-age male, appears older than stated age, intubated on mechanical life support critically ill, CVVHD going HEENT: NCAT, sclera clear, endotracheal tube in place Neuro: Sedated on mechanical support, does awake, follows commands squeezing hands CV: Regular rate rhythm, S1-S2 PULM: Bilateral mechanically ventilated breath sounds GI: Soft, nontender nondistended Extremities: Lower extremity edema Skin: Warm dry no rash  Labs/imaging that I have personally reviewed: (right click and "Reselect all SmartList Selections" daily)   Labs reviewed White count improving Sodium 135 BUN 22 Creatinine 1.92 White blood cell count 71,000 Hemoglobin 9.1  Resolved Hospital Problem list     Assessment & Plan:   Acute encephalopathy prompting Code Stroke, work-up negative Patient became acutely unresponsive 6/9 with fixed and dilated pupils, minimal gag, breathing  spontaneously. Unclear what caused this episode.  Work-up as so far been unrevealing Plan: Remains on mechanical support Continue to lighten sedation Is following commands today. Slowly improving  Acute hypoxemic respiratory failure - improved Bilateral pleural effusions;  inferior R sided pigtail tube no longer in place, removed 6/10; surgical CT removed 6/7 Likely LLL HAP Plan: Postop care per thoracic surgery Remains on full mechanical vent support Wean PEEP and FiO2 as tolerated to maintain sats above 90% SBT SAT as tolerated VAP bundle PAD guidelines sedation Potentially consider extubation within the next 24 to 48 hours  New Leukocytosis 6/10 Fever to 102 Plan Patient culture Continue broad-spectrum antibiotics Continue antifungal He is improving.  Not really sure where the source was.  Post-op mediastinal hemorrhage Cirrhotic and hemorrhage- induced consumptive coagulopathy Hemorrhagic shock, resolved Acute blood loss anemia (improving) Thrombocytopenia- (Improving): in setting of blood loss, coagulopathy, and cirrhosis Elevated Alk Phos/AST P: Follow CBC Transfusion threshold for hemoglobin less than 7 no obvious sign of bleeding.  No continue to follow  In hospital cardiac arrest precipitated by respiratory distress- PEA, minimal downtime. 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 VT- On 6/1 Plan: Appreciate heart failure input Continue goal MAP of 65 Continue to wean norepinephrine  ESRD on HD PTA: known IJ stenosis and concern of R IJ stenosis; difficult access, RUE AVF Trialysis catheter placed 6/10 Plan: Continue CVVHD per nephrology Follow BMP  DM2, controlled CBGs with SSI  Best practice (right click and "Reselect all SmartList Selections" daily)  Diet:  NPO>> Tube feeds Pain/Anxiety/Delirium protocol (if indicated): Yes (RASS goal 0) VAP protocol (if indicated):  Yes DVT prophylaxis: Systemic AC - discontinued at the time of Code Stroke GI prophylaxis: PPI Glucose control:  SSI Yes Central venous access:  Yes, and it is still needed Arterial line:  N/A  Foley:  N/A Mobility:  bed rest  PT consulted: Yes Last date of multidisciplinary goals of care discussion [briefly 6/9 in the setting of Code Stroke] Code Status:  full code Disposition: per primary  This patient is critically ill with multiple organ system failure; which, requires frequent high complexity decision making, assessment, support, evaluation, and titration of therapies. This was completed through the application of advanced monitoring technologies and extensive interpretation of multiple databases. During this encounter critical care time was devoted to patient care services described in this note for 32 minutes.  Garner Nash, DO [facility] 04/04/2021 10:11 AM

## 2021-04-04 NOTE — Progress Notes (Signed)
ANTICOAGULATION CONSULT NOTE  Pharmacy Consult for heparin Indication: atrial flutter  Allergies  Allergen Reactions   Shellfish-Derived Products Anaphylaxis   Wasp Venom Protein Anaphylaxis   Penicillins Other (See Comments)    Childhood reaction    Patient Measurements: Height: '6\' 2"'$  (188 cm) Weight: 75.7 kg (166 lb 14.2 oz) IBW/kg (Calculated) : 82.2 Heparin Dosing Weight: 79kg  Vital Signs: Temp: 98.5 F (36.9 C) (06/12 0724) Temp Source: Oral (06/12 0724) BP: 116/60 (06/12 1045) Pulse Rate: 98 (06/12 1045)  Labs: Recent Labs    04/01/21 1328 04/01/21 1526 04/01/21 1529 04/01/21 1801 04/02/21 1713 04/02/21 2146 04/03/21 0021 04/03/21 0838 04/03/21 1550 04/03/21 1722 04/04/21 0123 04/04/21 0937  HGB 9.9*   < >  --    < > 9.8*  --  8.8*  --   --   --  9.1*  --   HCT 33.2*   < >  --    < > 29.3*  --  26.2*  --   --   --  27.0*  --   PLT 326  --   --    < > 286  --  256  --   --   --  291  --   LABPROT 16.2*  --   --   --   --   --   --   --   --   --   --   --   INR 1.3*  --   --   --   --   --   --   --   --   --   --   --   HEPARINUNFRC  --   --   --    < >  --    < >  --    < >  --  0.10* 0.11* 0.17*  CREATININE 4.19*  --   --    < > 3.77*  --  3.18*  --  2.14*  --  1.92*  --   TROPONINIHS 99*  --  111*  --   --   --   --   --   --   --   --   --    < > = values in this interval not displayed.     Estimated Creatinine Clearance: 46.5 mL/min (A) (by C-G formula based on SCr of 1.92 mg/dL (H)).   Medical History: Past Medical History:  Diagnosis Date   ESRD (end stage renal disease) ([place])    Hypertension    Peripheral arterial disease ([place])    Type 2 diabetes mellitus ([place])    Assessment: 5 YOF with recent CABG now with postop afib/flutter. HIT level was not actually sent though a result exists in chart, ok to disregard after discussion with team.   Heparin level difficult to obtain due to limited access. 6/11 Foot stick unsuccessful. RN will draw  future levels from red CRRT port.   Heparin level still below goal at 0.17.  No issues with infusion or bleeding per RN but noted H/H drop so will adjust conservatively.  Some blood tinged secretions from ET/NG tubes overnight with suctioning, not seen today.  Goal of Therapy:  Heparin level 0.3-0.5  units/ml  Monitor platelets by anticoagulation protocol: Yes   Plan:  Increase IV heparin to 2300 units/hr Repeat heparin level in 8 hrs. Daily heparin level, CBC. Will monitor for bleeding.  [place] Crane, Roylene Reason, [facility] Clinical Pharmacist  04/04/2021  10:55 AM   [facility] pharmacy phone numbers are listed on [email or web address]

## 2021-04-04 NOTE — Progress Notes (Signed)
11 Days Post-Op Procedure(s) (LRB): EXPLORATION POST OPERATIVE OPEN HEART (N/A) Subjective: sedated  Objective: Vital signs in last 24 hours: Temp:  [95.7 F (35.4 C)-98.8 F (37.1 C)] 98.5 F (36.9 C) (06/12 0724) Pulse Rate:  [97-104] 100 (06/12 0800) Cardiac Rhythm: Atrial flutter (06/12 0800) Resp:  [25-34] 34 (06/12 0800) BP: (68-129)/(37-96) 117/54 (06/12 0800) SpO2:  [98 %-100 %] 100 % (06/12 0800) FiO2 (%):  [40 %] 40 % (06/12 0753)  Hemodynamic parameters for last 24 hours:    Intake/Output from previous day: 06/11 0701 - 06/12 0700 In: 2766.7 [I.V.:1916.3; NG/GT:320; IV Piggyback:530.4] Out: 6288 [Emesis/NG output:1500] Intake/Output this shift: Total I/O In: 62.6 [I.V.:62.6] Out: 140 [Other:140]  General appearance: ill appearing Neurologic: sedated Heart: regular rate and rhythm Lungs: clear to auscultation bilaterally Abdomen: soft,nondistended Wound: clean and dry  Lab Results: Recent Labs    04/03/21 0021 04/04/21 0123  WBC 82.3* 71.3*  HGB 8.8* 9.1*  HCT 26.2* 27.0*  PLT 256 291   BMET:  Recent Labs    04/03/21 1550 04/04/21 0123  NA 134* 135  K 4.7 4.4  CL 101 102  CO2 22 22  GLUCOSE 97 111*  BUN 26* 22*  CREATININE 2.14* 1.92*  CALCIUM 7.4* 7.6*    PT/INR:  Recent Labs    04/01/21 1328  LABPROT 16.2*  INR 1.3*   ABG    Component Value Date/Time   PHART 7.237 (L) 04/02/2021 0352   HCO3 16.9 (L) 04/02/2021 0352   TCO2 18 (L) 04/02/2021 0352   ACIDBASEDEF 10.0 (H) 04/02/2021 0352   O2SAT 96.0 04/02/2021 0352   CBG (last 3)  Recent Labs    04/03/21 1945 04/04/21 0009 04/04/21 0406  GLUCAP 92 94 96    Assessment/Plan: S/P Procedure(s) (LRB): EXPLORATION POST OPERATIVE OPEN HEART (N/A) Remains critically ill NEURO- following commands when sedation lightened CV- in SR, slowly weaning norepi, still on vasopressin RESP- PRVC 34, down to 40%, 5 PEEP  Vent per CCM RENAL- on CVVH ENDO- CBG well controlled GI- TF  held for high residual last night, restart at lower volume today ID- WBC down slightly to 71K  On Vanco, Maxipime, Flagyl and Eraxis   LOS: 20 days    David Rose 04/04/2021

## 2021-04-04 NOTE — Progress Notes (Signed)
ANTICOAGULATION CONSULT NOTE  Pharmacy Consult for heparin Indication: atrial flutter  Allergies  Allergen Reactions   Shellfish-Derived Products Anaphylaxis   Wasp Venom Protein Anaphylaxis   Penicillins Other (See Comments)    Childhood reaction    Patient Measurements: Height: '6\' 2"'$  (188 cm) Weight: 75.7 kg (166 lb 14.2 oz) IBW/kg (Calculated) : 82.2 Heparin Dosing Weight: 79kg  Vital Signs: Temp: 99.1 F (37.3 C) (06/12 1538) Temp Source: Oral (06/12 1538) BP: 100/54 (06/12 1900) Pulse Rate: 85 (06/12 1900)  Labs: Recent Labs    04/02/21 1713 04/02/21 2146 04/03/21 0021 04/03/21 0838 04/03/21 1550 04/03/21 1722 04/04/21 0123 04/04/21 0937 04/04/21 1530 04/04/21 1828  HGB 9.8*  --  8.8*  --   --   --  9.1*  --   --   --   HCT 29.3*  --  26.2*  --   --   --  27.0*  --   --   --   PLT 286  --  256  --   --   --  291  --   --   --   HEPARINUNFRC  --    < >  --    < >  --    < > 0.11* 0.17*  --  0.11*  CREATININE 3.77*  --  3.18*  --  2.14*  --  1.92*  --  1.56*  --    < > = values in this interval not displayed.     Estimated Creatinine Clearance: 57.3 mL/min (A) (by C-G formula based on SCr of 1.56 mg/dL (H)).   Medical History: Past Medical History:  Diagnosis Date   ESRD (end stage renal disease) ([place])    Hypertension    Peripheral arterial disease ([place])    Type 2 diabetes mellitus ([place])    Assessment: 18 YOF with recent CABG now with postop afib/flutter. HIT level was not actually sent though a result exists in chart, ok to disregard after discussion with team.   Heparin level difficult to obtain due to limited access. 6/11 Foot stick unsuccessful. RN will draw future levels from red CRRT port.   Heparin level still below goal at 0.11.  No issues with infusion or bleeding per RN- no further ET/NG tube blood tinged secretions. H/H, plt stable. HL not moving despite high heparin rate, possible resistance or low AT3.   Goal of Therapy:  Heparin  level 0.3-0.5  units/ml  Monitor platelets by anticoagulation protocol: Yes   Plan:  Increase IV heparin to 2500 units/hr Repeat heparin level in 8 hrs. Consider switching to bivalirudin if still not at goal  Daily heparin level, CBC. Will monitor for bleeding.   Benetta Spar, PharmD, BCPS, BCCP Clinical Pharmacist  Please check AMION for all [place] phone numbers After 10:00 PM, call [place] [phone]

## 2021-04-04 NOTE — Progress Notes (Signed)
PHARMACY NOTE:  ANTIMICROBIAL RENAL DOSAGE ADJUSTMENT  Current antimicrobial regimen includes a mismatch between antimicrobial dosage and estimated renal function.  As per policy approved by the Pharmacy & Therapeutics and Medical Executive Committees, the antimicrobial dosage will be adjusted accordingly.  Current antimicrobial dosage:  cefepime 1g q24 hr  Indication: sepsis, possible abdominal vs pulmonary source  Renal Function:  Estimated Creatinine Clearance: 57.3 mL/min (A) (by C-G formula based on SCr of 1.56 mg/dL (H)). '[]'$      On intermittent HD, scheduled: '[x]'$      On CRRT    Antimicrobial dosage has been changed to:  cefepime 2g Q12 hr     Thank you for allowing pharmacy to be a part of this patient's care.  Benetta Spar, PharmD, BCPS, BCCP Clinical Pharmacist  Please check AMION for all [place] phone numbers After 10:00 PM, call [place] [phone]

## 2021-04-04 NOTE — Progress Notes (Signed)
Patient ID: David Mcfayden., male   DOB: 12/12/1965, 55 y.o.   MRN: [id number] S: Pt seen and examined while on CRRT.  Lab results and orders reviewed for alterations. O:BP 120/61   Pulse 98   Temp 98.5 F (36.9 C) (Oral)   Resp (!) 34   Ht _0  (1.88 m)   Wt 75.7 kg   SpO2 99%   BMI 21.43 kg/m   Intake/Output Summary (Last 24 hours) at 04/04/2021 1007 Last data filed at 04/04/2021 1000 Gross per 24 hour  Intake 2745.9 ml  Output 6347 ml  Net -3601.1 ml   Intake/Output: I/O last 3 completed shifts: In: 4119 [I.V.:3068.4; NG/GT:320; IV Piggyback:730.6] Out: 7279 [Emesis/NG output:1500; Other:5779]  Intake/Output this shift:  Total I/O In: 424.7 [I.V.:226.8; NG/GT:160; IV Piggyback:37.9] Out: 463 [Other:463] Weight change:  Gen: intubated and sedated CVS:RRR Resp:bilateral ventilated BS WCH:ENIDPOEUMP BS Ext: trace pedal edema, RUE AVF +T/B  Recent Labs  Lab 03/29/21 0325 03/29/21 1533 03/30/21 0414 03/31/21 0356 04/01/21 0325 04/01/21 1328 04/01/21 2212 04/02/21 0344 04/02/21 0352 04/02/21 1347 04/02/21 1713 04/03/21 0021 04/03/21 1550 04/04/21 0123  NA 134*   < > 133* 132* 129*   < > 128* 129* 130* 133* 132* 133* 134* 135  K 5.0   < > 4.6 4.4 4.6   < > 3.5 4.3 4.3 4.4 4.5 4.1 4.7 4.4  CL 101   < > 102 99 95*   < > 98 97*  --  98 98 101 101 102  CO2 25   < > _1 < > 18* 16*  --  19* 21* 19* 22 22  GLUCOSE 132*   < > 117* 170* 175*   < > 72 102*  --  125* 132* 139* 97 111*  BUN 17   < > 20 29* 37*   < > 45* 50*  --  45* 42* 37* 26* 22*  CREATININE 1.53*   < > 1.73* 2.63* 3.56*   < > 4.33* 4.60*  --  4.17* 3.77* 3.18* 2.14* 1.92*  ALBUMIN 2.9*   < > 2.6* 2.5* 2.5*  --   --  2.2*  --  2.1*  --  2.0* 2.1* 2.1*  CALCIUM 7.7*   < > 7.8* 7.4* 7.2*   < > 7.2* 7.4*  --  7.0* 7.2* 6.7* 7.4* 7.6*  PHOS 2.2*   < > 2.6 3.0 4.2  --   --  3.3  --  3.6 3.8 3.6 2.4*  2.5  --   AST 38  --  40 88* 56*  --   --  44*  --   --   --  30  --  40  ALT 13  --  _2 --    --  23  --   --   --  19  --  24   < > = values in this interval not displayed.   Liver Function Tests: Recent Labs  Lab 04/02/21 0344 04/02/21 1347 04/03/21 0021 04/03/21 1550 04/04/21 0123  AST 44*  --  30  --  40  ALT 23  --  19  --  24  ALKPHOS 205*  --  200*  --  198*  BILITOT 1.8*  --  2.3*  --  2.1*  PROT 5.6*  --  5.3*  --  5.7*  ALBUMIN 2.2*   < > 2.0* 2.1* 2.1*   < > = values in  this interval not displayed.   No results for input(s): LIPASE, AMYLASE in the last 168 hours. No results for input(s): AMMONIA in the last 168 hours. CBC: Recent Labs  Lab 04/01/21 1328 04/01/21 1526 04/02/21 0344 04/02/21 0352 04/02/21 0928 04/02/21 1713 04/03/21 0021 04/04/21 0123  WBC 28.1*  --  87.8*  --  86.9* 88.4* 82.3* 71.3*  NEUTROABS 21.1*  --   --   --   --   --  75.2* 64.6*  HGB 9.9*   < > 9.8*   < > 9.5* 9.8* 8.8* 9.1*  HCT 33.2*   < > 30.3*   < > 28.9* 29.3* 26.2* 27.0*  MCV 100.0  --  95.3  --  93.2 91.8 91.3 89.1  PLT 326  --  272  --  281 286 256 291   < > = values in this interval not displayed.   Cardiac Enzymes: No results for input(s): CKTOTAL, CKMB, CKMBINDEX, TROPONINI in the last 168 hours. CBG: Recent Labs  Lab 04/03/21 1106 04/03/21 1523 04/03/21 1945 04/04/21 0009 04/04/21 0406  GLUCAP 104* 98 92 94 96    Iron Studies: No results for input(s): IRON, TIBC, TRANSFERRIN, FERRITIN in the last 72 hours. Studies/Results: CT ABDOMEN PELVIS WO CONTRAST  Result Date: 04/03/2021 CLINICAL DATA:  Right upper quadrant pain, leukocytosis, Murphy sign EXAM: CT ABDOMEN AND PELVIS WITHOUT CONTRAST TECHNIQUE: Multidetector CT imaging of the abdomen and pelvis was performed following the standard protocol without IV contrast. COMPARISON:  CTa chest 04/01/2021 FINDINGS: Lower chest: Multifocal patchy ground-glass and consolidative opacities in the lung bases with partial atelectatic collapse of both lower lobes. Bilateral pleural effusions as well including some  questionable loculation of the left effusion. Borderline cardiomegaly. 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 Postsurgical changes from prior sternotomy. Hepatobiliary: Lobular hepatic surface contour. Hypertrophy of the left lobe and caudate. No concerning focal liver lesion is seen. Few vascular calcifications. Hyperdense material layering within the moderately distended gallbladder, possible vicarious extravasation of contrast from recent CT imaging versus layering biliary sludge. No discernible gallstones. No biliary ductal dilatation. Pancreas: Extensive fatty replacement of the pancreas. No pancreatic ductal dilatation or surrounding inflammatory changes. Spleen: Punctate calcifications throughout the spleen likely reflecting granulomata. Normal splenic size. No concerning focal splenic lesion. Adrenals/Urinary Tract: Normal adrenals. Moderate bilateral perinephric stranding is symmetric and nonspecific. Appearance is similar to comparison CT imaging extensive vascular calcifications. No hydronephrosis or evidence of obstructive uropathy. Hyperdense material layering within the partially decompressed urinary bladder likely reflecting excreted contrast media. Bladder wall thickening is nonspecific given underdistention. Stomach/Bowel: Transesophageal tube tip and side port distal to the GE junction, terminating near the gastric antrum. Mild mural thickening and stranding about the terminal ileum as well as diffusely throughout the colonic segments. No evidence of bowel obstruction. Normal caliber appendix in the right quadrant. Vascular/Lymphatic: Extensive atherosclerotic calcification of the abdominal aorta and branch vessels. No aneurysm or ectasia. No other gross complication limitations of an unenhanced CT. Reproductive: Coarse eccentric calcification of the prostate. No concerning abnormalities of the prostate or seminal vesicles.  High-riding appearance of the bilateral testes with small hydroceles. Other: Small moderate volume ascites particularly in the right upper quadrant and layering in the deep pelvis pericolic gutter. Extensive circumferential body wall edema. No free intraperitoneal air. Fat and fluid containing left paraumbilical hernia. No bowel containing hernia. Musculoskeletal: No acute osseous abnormality or suspicious osseous lesion. Multilevel degenerative changes are present in the imaged portions  of the spine. Posterior spurring and calcified disc bulge L5-S1 resulting in severe canal stenosis and bilateral foraminal narrowing. IMPRESSION: 1. Stigmata of cirrhosis with a nodular liver surface contour as well as hypertrophy of the left lobe and caudate. 2. Features of developing anasarca with moderate ascites and extensive circumferential body wall edema as well as bilateral pleural effusions. 3. Moderate distention of the gallbladder with layering hyperdense material which could reflect vicarious extravasation of contrast given recent contrast enhanced exams. No gallbladder wall thickening or biliary ductal dilatation is seen however if there is persisting clinical concern, could consider right upper quadrant ultrasound. 4. Circumferential thickening of the terminal ileum and diffusely throughout the colon, nonspecific in the setting of intrinsic liver disease. Could reflect portal enteropathy/colopathy versus ileocolitis in the appropriate clinical setting. 5. Patchy opacities in bilateral lung bases as well as densely atelectatic bilateral lower lobes, could reflect a combination of atelectasis, underlying airspace disease/infection and/or edema. Bilateral effusions with some questionable loculation of the left effusion as well. 6. Mild cardiomegaly with three-vessel coronary disease in postsurgical changes from sternotomy and CABG. Electronically Signed   By: Lovena Le M.D.   On: 04/03/2021 00:24   DG CHEST PORT 1  VIEW  Result Date: 04/04/2021 CLINICAL DATA:  Status post CABG. EXAM: PORTABLE CHEST 1 VIEW COMPARISON:  04/03/2021 FINDINGS: A feeding tube extends beyond the inferior aspect of the film. Endotracheal tube is poorly visualized, likely terminating at the level of the clavicles, approximately 6.5 cm above the carina. Prior median sternotomy. Numerous leads and wires project over the chest. Midline trachea. Mild cardiomegaly. Small left pleural effusion is similar. No pneumothorax. Diffuse pulmonary interstitial thickening. Improved left base airspace disease. IMPRESSION: Similar small left pleural effusion and adjacent Airspace disease, likely atelectasis. No pneumothorax or other acute superimposed finding. Electronically Signed   By: Abigail Miyamoto M.D.   On: 04/04/2021 08:03   DG CHEST PORT 1 VIEW  Result Date: 04/03/2021 CLINICAL DATA:  Respiratory dependent, evaluate endotracheal tube EXAM: PORTABLE CHEST 1 VIEW COMPARISON:  04/02/2021 FINDINGS: There is an endotracheal tube overlying the midthoracic trachea. There is a nasogastric tube coursing below the diaphragm, tip excluded by collimation. Right neck catheter has been removed since the prior exam. Unchanged cardiomediastinal silhouette. There is a small left pleural effusion with adjacent left basilar consolidation. Small right pleural effusion. Diffuse mild interstitial opacities. There is no visible pneumothorax. Bones are unchanged. IMPRESSION: Persistent left greater than right pleural effusions with adjacent basilar atelectasis. Lines and tubes as described above. Right neck catheter has been removed since the prior exam. Electronically Signed   By: Maurine Simmering   On: 04/03/2021 10:10   DG Abd Portable 1V  Result Date: 04/04/2021 CLINICAL DATA:  Postop.  Follow-up. EXAM: PORTABLE ABDOMEN - 1 VIEW COMPARISON:  CT 04/03/2021.  Plain film of 04/02/2021 FINDINGS: Feeding tube has been minimally advanced, terminating at the antrum/pyloric region. A  left femoral catheter is identified. Nonobstructive bowel gas pattern. Vascular calcifications. IMPRESSION: Feeding tube terminating at the gastric antrum or pylorus. Electronically Signed   By: Abigail Miyamoto M.D.   On: 04/04/2021 08:04   DG Abd Portable 1V  Result Date: 04/02/2021 CLINICAL DATA:  Status post feeding tube placement EXAM: PORTABLE ABDOMEN - 1 VIEW COMPARISON:  One day prior FINDINGS: Nasogastric tube has been removed. Feeding tube terminates at the distal stomach. Prior median sternotomy. Mild cardiomegaly. Small left pleural effusion. Grossly nonobstructive bowel gas pattern. IMPRESSION: Feeding tube terminating at the distal stomach.  Electronically Signed   By: Abigail Miyamoto M.D.   On: 04/02/2021 12:40   US Abdomen Limited RUQ (LIVER/GB)  Result Date: 04/03/2021 CLINICAL DATA:  Sepsis. EXAM: ULTRASOUND ABDOMEN LIMITED RIGHT UPPER QUADRANT COMPARISON:  CT scan April 02, 2021 FINDINGS: Gallbladder: Sludge in the gallbladder. The gallbladder wall measures up to 4.4 mm. No stones or Murphy's sign reported. Common bile duct: Diameter: 3.1 mm Liver: The liver demonstrates a nodular contour with no focal mass. Portal vein is patent on color Doppler imaging with normal direction of blood flow towards the liver. Other: Right pleural effusion.  Ascites. IMPRESSION: 1. Sludge in the gallbladder. Mild gallbladder wall thickening. No Murphy's sign or stone identified. 2. Nodular contour to the liver suggesting cirrhosis. 3. Right pleural effusion and ascites. Electronically Signed   By: Dorise Bullion III M.D   On: 04/03/2021 18:13    aspirin  81 mg Per Tube Daily   atorvastatin  40 mg Per Tube Daily   bisacodyl  10 mg Oral Daily   Or   bisacodyl  10 mg Rectal Daily   chlorhexidine gluconate (MEDLINE KIT)  15 mL Mouth Rinse BID   Chlorhexidine Gluconate Cloth  6 each Topical Daily   darbepoetin (ARANESP) injection - NON-DIALYSIS  60 mcg Subcutaneous Q Thu-1800   docusate  200 mg Per Tube Daily    feeding supplement (PROSource TF)  45 mL Per Tube QID   insulin aspart  0-24 Units Subcutaneous Q4H   insulin glargine  10 Units Subcutaneous Daily   ipratropium-albuterol  3 mL Nebulization QID   mouth rinse  15 mL Mouth Rinse 10 times per day   metoCLOPramide (REGLAN) injection  10 mg Intravenous Q8H   midodrine  15 mg Per Tube TID WC   multivitamin  1 tablet Per Tube QHS   pantoprazole sodium  40 mg Per Tube Daily   polyethylene glycol  17 g Per Tube Daily   sodium chloride flush  10-40 mL Intracatheter Q12H   sodium chloride flush  3 mL Intravenous Q12H   Thrombi-Pad  1 each Topical Once    BMET    Component Value Date/Time   NA 135 04/04/2021 0123   K 4.4 04/04/2021 0123   CL 102 04/04/2021 0123   CO2 22 04/04/2021 0123   GLUCOSE 111 (H) 04/04/2021 0123   BUN 22 (H) 04/04/2021 0123   CREATININE 1.92 (H) 04/04/2021 0123   CALCIUM 7.6 (L) 04/04/2021 0123   GFRNONAA 41 (L) 04/04/2021 0123   CBC    Component Value Date/Time   WBC 71.3 (HH) 04/04/2021 0123   RBC 3.03 (L) 04/04/2021 0123   HGB 9.1 (L) 04/04/2021 0123   HCT 27.0 (L) 04/04/2021 0123   PLT 291 04/04/2021 0123   MCV 89.1 04/04/2021 0123   MCH 30.0 04/04/2021 0123   MCHC 33.7 04/04/2021 0123   RDW 20.8 (H) 04/04/2021 0123   LYMPHSABS 1.3 04/04/2021 0123   MONOABS 1.9 (H) 04/04/2021 0123   EOSABS 0.5 04/04/2021 0123   BASOSABS 0.3 (H) 04/04/2021 0123    Outpatient dialysis prescription: [facility] [phone] 4 Hours, 180 dialyzer, 2K/2.5 Ca, DFR 500, BFR 400 Use AVF; graft on inner arm, 15 guage Heparin loading dose 2000 and hourly dose of 1200 EDW 78.5 kg Last post weight 78 kg on 5/21 (went in at 78 and came out at 78 kg) Meds: epogen 2400 units each tx venofer 50 mg weekly Not on hectorol or calcitriol sensipar 30 mg three times a  week with HD   Assessment/Plan:   Severe sepsis syndrome - on broad spectrum abx.  ID consulted.  Now on levophed, neo, and vasopressin per PCCM.  Possible  new LLL PNA. ESRD - [place] dialysis in [place], last HD 5/30, now on CRRT since 04/14/2021 following CABG complicated by PEA arrest and need for pressors.  Still remains on pressors however he is feeling better.   CRRT stopped 03/30/21 and temp HD catheter removed at that time. S/p left fem trialysis catheter placed 04/02/21 CRRT resumed 04/02/21 due to acute decompensation and profound hypotension. CRRT orders:  4K/2.5 Ca for all fluids, rate of prefilter 500 ml/hr, post-filter 300 ml/hr, dialysate 1500 ml/hr, UF rate 0-50 ml/hr, no heparin. CAD s/p CABG x 4 complicated by PEA arrest.  Currently stable Acute on chronic CHF with cardiogenic shock post CABG - currently on levophed, milrinone, and started on midodrine.  Ok to increase midodrine to 15 mg tid and wean levophed as tolerated. Repeat ECHO EF 45-50%, RV function moderately reduced. Pulmonary HTN - per HF team Cirrhosis - presumably cardiogenic.  S/p paracentesis on 03/18/21. 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  Hypophosphatemia - repleted with IV phos and follow.   Donetta Potts, MD [employer] [phone]

## 2021-04-04 NOTE — Progress Notes (Signed)
At aprox 2120 pt self extubated while restrained, the team promptly notified e-link and the ground team. Team mechanically bagged pt till Dr. Patsey Berthold arrived to re-intubate pt. Pt is now stable with following vital signs  HR 98 SPO2 98% BP 132/55   Chest Xray has been ordered. Will continue to monitor pt

## 2021-04-04 NOTE — Progress Notes (Addendum)
ANTICOAGULATION CONSULT NOTE - Follow Up Consult  Pharmacy Consult for heparin Indication:  Aflutter  Labs: Recent Labs    04/01/21 1328 04/01/21 1526 04/01/21 1529 04/01/21 1801 04/02/21 1713 04/02/21 2146 04/03/21 0021 04/03/21 0838 04/03/21 0923 04/03/21 1550 04/03/21 1722 04/04/21 0123  HGB 9.9*   < >  --    < > 9.8*  --  8.8*  --   --   --   --  9.1*  HCT 33.2*   < >  --    < > 29.3*  --  26.2*  --   --   --   --  27.0*  PLT 326  --   --    < > 286  --  256  --   --   --   --  291  LABPROT 16.2*  --   --   --   --   --   --   --   --   --   --   --   INR 1.3*  --   --   --   --   --   --   --   --   --   --   --   HEPARINUNFRC  --   --   --    < >  --    < >  --    < > 0.21*  --  0.10* 0.11*  CREATININE 4.19*  --   --    < > 3.77*  --  3.18*  --   --  2.14*  --   --   TROPONINIHS 99*  --  111*  --   --   --   --   --   --   --   --   --    < > = values in this interval not displayed.    Assessment: 55yo male trauma subtherapeutic on heparin after conservative rate change; no gtt issues though RN notes some minor bleeding at ET/NG tubes.  Goal of Therapy:  Heparin level 0.3-0.5 units/ml   Plan:  Will increase heparin gtt conservatively to 2200 units/hr and check level in 8 hours.    Wynona Neat, PharmD, BCPS  04/04/2021,2:14 AM

## 2021-04-04 NOTE — Progress Notes (Signed)
Patient ID: David Rose., male   DOB: 20-Mar-1966, 55 y.o.   MRN: [id number] P    Advanced Heart Failure Rounding Note  PCP-Cardiologist: Carlyle Dolly, MD    Patient Profile   55 y/o male w/  history of ESRD due to DM and CHF with mid-range EF (LV EF 40-45% with moderately decreased RV systolic function and PASP 99 on 4/22 echo) as well as cirrhosis of uncertain etiology.  Based on low EF and elevated PA pressure, right and left heart cath was done in 5/22.  This showed severe 3VD, high cardiac output with moderate pulmonary hypertension but low PVR.  He is planned for CABG, but admitted pre-op for optimization.    Subjective:    CABG x 4 on 5/31 with LIMA-LAD, SVG-RCA, SVG-OM, SVG-D.   Coagulopathic post-op with multiple blood products.  Extubated post-op but developed respiratory distress => PEA arrest then VT. Reintubated.  ROSC with ACLS.  Returned to OR for mediastinal exploration 6/1.  Extubated 6/2.   6/6 Midodrine started.   6/9 AMS--> CT negative for acute bleed or ischemic CVA. Intubated. Suspected septic shock.  CTA chest showed no PE, moderate bilateral pleural effusions. Re-intubated and CVVH restarted. CT abdomen/pelvis: anasarca, bilateral pleural effusions and lung base opacities, thickened colon/terminal ileum (?due to cirrhosis, ?ileocolitis).   Remains intubated/sedated. On CVVHD   On Norepi 20 mcg -> 9 + vaso 0.03 units. Neo off. Lactate down to 1.8  No co-ox drawn .  On Cefepime, vancomycin, Flagyl for HCAP. Eraxis added yesterday. WBCs 88 => 82. => 71K. ID following.   He remains in atrial flutter, on heparin gtt and amiodarone.    Echo (6/9) with EF 45-50%, inferolateral severe hypokinesis, moderate RV dysfunction.    Objective:   Weight Range: 75.7 kg Body mass index is 21.43 kg/m.   Vital Signs:   Temp:  [97.9 F (36.6 C)-99.1 F (37.3 C)] 99.1 F (37.3 C) (06/12 1538) Pulse Rate:  [85-104] 85 (06/12 1900) Resp:  [27-34] 34 (06/12 1900) BP:  (80-127)/(39-85) 100/54 (06/12 1900) SpO2:  [99 %-100 %] 100 % (06/12 1900) FiO2 (%):  [40 %] 40 % (06/12 1424) Last BM Date: 03/31/21  Weight change: Filed Weights   03/31/21 0647 04/01/21 0500 04/02/21 0452  Weight: 75.8 kg 78.3 kg 75.7 kg    Intake/Output:   Intake/Output Summary (Last 24 hours) at 04/04/2021 1920 Last data filed at 04/04/2021 1900 Gross per 24 hour  Intake 2878.13 ml  Output 6207 ml  Net -3328.87 ml       Physical Exam   General:  Sedated on vent. Chronically-ill appearing. Will arouse slightly to stimulation but not following commands HEENT: normal Neck: supple. No JVD Carotids 2+ bilat; no bruits. No lymphadenopathy or thryomegaly appreciated. Cor: PMI nondisplaced. Regular rate & rhythm. No rubs, gallops or murmurs. Sternal wound looks good.  Lungs: clear Abdomen: soft, nontender, nondistended. No hepatosplenomegaly. No bruits or masses. Good bowel sounds. Extremities: no cyanosis, clubbing, rash, mild presacral and thigh edema  LFV trialysis cath Neuro: as above   Telemetry   Atypical flutter 90s. Personally reviewed    Labs    CBC Recent Labs    04/03/21 0021 04/04/21 0123  WBC 82.3* 71.3*  NEUTROABS 75.2* 64.6*  HGB 8.8* 9.1*  HCT 26.2* 27.0*  MCV 91.3 89.1  PLT 256 427    Basic Metabolic Panel Recent Labs    04/03/21 1550 04/04/21 0123 04/04/21 1530  NA 134* 135 137  K 4.7  4.4 4.1  CL 101 102 104  CO2 _0 GLUCOSE 97 111* 100*  BUN 26* 22* 16  CREATININE 2.14* 1.92* 1.56*  CALCIUM 7.4* 7.6* 7.9*  MG 2.3 2.4  --   PHOS 2.4*  2.5  --  1.8*    Liver Function Tests Recent Labs    04/03/21 0021 04/03/21 1550 04/04/21 0123 04/04/21 1530  AST 30  --  40  --   ALT 19  --  24  --   ALKPHOS 200*  --  198*  --   BILITOT 2.3*  --  2.1*  --   PROT 5.3*  --  5.7*  --   ALBUMIN 2.0*   < > 2.1* 2.0*   < > = values in this interval not displayed.    No results for input(s): LIPASE, AMYLASE in the last 72  hours. Cardiac Enzymes No results for input(s): CKTOTAL, CKMB, CKMBINDEX, TROPONINI in the last 72 hours.  BNP: BNP (last 3 results) Recent Labs    03/02/2021 1545  BNP 1,127.8*     ProBNP (last 3 results) No results for input(s): PROBNP in the last 8760 hours.   D-Dimer No results for input(s): DDIMER in the last 72 hours.  Hemoglobin A1C No results for input(s): HGBA1C in the last 72 hours. Fasting Lipid Panel No results for input(s): CHOL, HDL, LDLCALC, TRIG, CHOLHDL, LDLDIRECT in the last 72 hours. Thyroid Function Tests No results for input(s): TSH, T4TOTAL, T3FREE, THYROIDAB in the last 72 hours.  Invalid input(s): FREET3  Other results:   Imaging    DG CHEST PORT 1 VIEW  Result Date: 04/04/2021 CLINICAL DATA:  Status post CABG. EXAM: PORTABLE CHEST 1 VIEW COMPARISON:  04/03/2021 FINDINGS: A feeding tube extends beyond the inferior aspect of the film. Endotracheal tube is poorly visualized, likely terminating at the level of the clavicles, approximately 6.5 cm above the carina. Prior median sternotomy. Numerous leads and wires project over the chest. Midline trachea. Mild cardiomegaly. Small left pleural effusion is similar. No pneumothorax. Diffuse pulmonary interstitial thickening. Improved left base airspace disease. IMPRESSION: Similar small left pleural effusion and adjacent Airspace disease, likely atelectasis. No pneumothorax or other acute superimposed finding. Electronically Signed   By: Abigail Miyamoto M.D.   On: 04/04/2021 08:03   DG Abd Portable 1V  Result Date: 04/04/2021 CLINICAL DATA:  Postop.  Follow-up. EXAM: PORTABLE ABDOMEN - 1 VIEW COMPARISON:  CT 04/03/2021.  Plain film of 04/02/2021 FINDINGS: Feeding tube has been minimally advanced, terminating at the antrum/pyloric region. A left femoral catheter is identified. Nonobstructive bowel gas pattern. Vascular calcifications. IMPRESSION: Feeding tube terminating at the gastric antrum or pylorus.  Electronically Signed   By: Abigail Miyamoto M.D.   On: 04/04/2021 08:04      Medications:     Scheduled Medications:  aspirin  81 mg Per Tube Daily   atorvastatin  40 mg Per Tube Daily   bisacodyl  10 mg Oral Daily   Or   bisacodyl  10 mg Rectal Daily   chlorhexidine gluconate (MEDLINE KIT)  15 mL Mouth Rinse BID   Chlorhexidine Gluconate Cloth  6 each Topical Daily   darbepoetin (ARANESP) injection - NON-DIALYSIS  60 mcg Subcutaneous Q Thu-1800   docusate  200 mg Per Tube Daily   feeding supplement (PROSource TF)  45 mL Per Tube QID   insulin aspart  0-24 Units Subcutaneous Q4H   insulin glargine  10 Units Subcutaneous Daily   ipratropium-albuterol  3  mL Nebulization QID   mouth rinse  15 mL Mouth Rinse 10 times per day   metoCLOPramide (REGLAN) injection  10 mg Intravenous Q8H   midodrine  15 mg Per Tube TID WC   multivitamin  1 tablet Per Tube QHS   pantoprazole sodium  40 mg Per Tube Daily   polyethylene glycol  17 g Per Tube Daily   sodium chloride flush  10-40 mL Intracatheter Q12H   sodium chloride flush  3 mL Intravenous Q12H   Thrombi-Pad  1 each Topical Once    Infusions:   prismasol BGK 4/2.5 500 mL/hr at 04/04/21 0124    prismasol BGK 4/2.5 300 mL/hr at 04/04/21 1310   sodium chloride     sodium chloride Stopped (04/02/21 2123)   amiodarone 30 mg/hr (04/04/21 1900)   anidulafungin Stopped (04/04/21 1110)   ceFEPime (MAXIPIME) IV     feeding supplement (VITAL 1.5 CAL) 20 mL/hr at 04/04/21 1100   fentaNYL infusion INTRAVENOUS 250 mcg/hr (04/04/21 1900)   heparin 2,300 Units/hr (04/04/21 1900)   lactated ringers     lactated ringers Stopped (04/01/21 1902)   metronidazole Stopped (04/04/21 1331)   norepinephrine (LEVOPHED) Adult infusion 9 mcg/min (04/04/21 1900)   prismasol BGK 4/2.5 1,500 mL/hr at 04/04/21 1230   vasopressin 0.03 Units/min (04/04/21 1900)    PRN Medications: heparin, lactated ringers, lidocaine (PF), lip balm, metoprolol tartrate, morphine  injection, ondansetron (ZOFRAN) IV, oxyCODONE, sodium chloride, sodium chloride flush, sodium chloride flush     Assessment/Plan   1. CAD: Severe 3VD with decreased EF.  I reviewed films with Dr. Ellyn Hack, PCI would be possible but would be difficult/high risk with multiple lesions and heavy calcification. 5/31 LIMA-LAD, SVG-RCA, SVG-OM, SVG-D. No s/s ischemic currently  - ASA 81 + atorvastatin 40 mg daily.  2. Acute/chronic HF with mid range EF => cardiogenic shock post-CABG: Suspect ischemic cardiomyopathy.  Echo 4/22 with EF 40-45% with moderately decreased RV systolic function and PASP 99, moderate TR. There was a prominent component of RV failure with RA pressure elevated out of proportion to PCWP (CVP/PCWP 0.875 on RHC) but PAPI adequate at 2.8. Post-op had PEA arrest then VT.  On 6/9, episode of altered mental status, progressive hypotension, and intubated.  Suspect septic shock now.  Echo (6/9) with EF 45-50%, inferolateral severe hypokinesis, moderate RV dysfunction. On NE 20 mcg + Neo 5 mcg + vasopressin 0.03 units (coming down).  Volume status much improved. Now about 5 pounds away from pre-op weight  - CVVH ongoing, continue UF negative 50-100 cc/hr today.  - Continue  to wean pressors as able.  - Continue midodrine 3. ESRD: Suspected due to diabetes.  Volume up post-op with multiple blood products.  - CVVH ongoing, will aim for net negative UF 50-100 cc/hr.  - Renal following  4. 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  The CO was not markedly high.  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 Oxygen saturation was 99% on RA pre-op, so no evidence for hepatopulmonary syndrome.   - Sildenafil has been on hold with pressors.  5. Cirrhosis: Noted on abdominal US from 2020.  H/o paracentesis.  Had workup at [facility] (though patient does not remember this), viral hepatitis labs  negative.  They ended up think that the cirrhosis was cardiogenic.  Repeat abdominal US 5/23 w/ nodular hepatic parenchymal pattern with increased echogenicity consistent with the patient's known cirrhosis. No focal hepatic  abnormality identified. Portal vein is patent. Moderate ascites. GI has seen, patient had 4L paracentesis on 5/26. - He may eventually need repeat paracentesis.  6. Coagulopathy: Post-op bleeding in setting of coagulopathy post-CABG in patient with cirrhosis.  Currently seems controlled.  Platelets dropped then recovered, doubt HIT.  7. VT: Had after PEA arrest am 6/1.  - IV amiodarone at this time.  No recent VT 8. Anemia: ABLA.  Stable today. Hgb stable. 9. Pleural effusions: Right > left effusion with chest tube, ?hepatic hydrothorax.       10. PEA arrest: Respiratory arrest post-extubation.  ROSC with CPR.  11. Acute hypoxemic respiratory failure:  Extubated 6/2, re-intubated 6/9.  - Stable on vent, per CCM.  12. Atrial fibrillation: Atypical atrial flutter currently.   - Continue amio drip - On heparin gtt but level not therapeutic despite high dose. Consider switch to argatroban - Eventual DCCV.  13. ID: Vancomycin/cefepime/Flagyl.   Suspected HCAP.  Septic shock.  WBC 88=>85. Now afebrile. Cultures NGTD.  - ID following, continue broad spectrum abx. Eraxis added 6/11 - ID recommending thoracentesis  14. Neuro: Altered mental status 6/9 with intubation.  CT head with no bleed or ischemic CVA seen.  EEG with diffuse encephalopathy.  RNs report he will wake up and intermittently follow commands.   CRITICAL CARE Performed by: Glori Bickers  Total critical care time: 35 minutes  Critical care time was exclusive of separately billable procedures and treating other patients.  Critical care was necessary to treat or prevent imminent or life-threatening deterioration.  Critical care was time spent personally by me on the following activities: development of treatment  plan with patient and/or surrogate as well as nursing, discussions with consultants, evaluation of patient's response to treatment, examination of patient, obtaining history from patient or surrogate, ordering and performing treatments and interventions, ordering and review of laboratory studies, ordering and review of radiographic studies, pulse oximetry and re-evaluation of patient's condition.  Glori Bickers 04/04/2021 7:20 PM

## 2021-04-04 NOTE — Plan of Care (Signed)
  Problem: Skin Integrity: Goal: Risk for impaired skin integrity will decrease Outcome: Progressing   

## 2021-04-04 NOTE — Progress Notes (Signed)
Subjective: Patient is critically ill on the ventilator getting CVVHD   Antibiotics:  Anti-infectives (From admission, onward)    Start     Dose/Rate Route Frequency Ordered Stop   04/03/21 1000  anidulafungin (ERAXIS) 100 mg in sodium chloride 0.9 % 100 mL IVPB        100 mg 78 mL/hr over 100 Minutes Intravenous Every 24 hours 04/02/21 1116     04/02/21 1700  ceFEPIme (MAXIPIME) 1 g in sodium chloride 0.9 % 100 mL IVPB        1 g 200 mL/hr over 30 Minutes Intravenous Every 24 hours 04/02/21 1238     04/02/21 1330  metroNIDAZOLE (FLAGYL) IVPB 500 mg        500 mg 100 mL/hr over 60 Minutes Intravenous Every 8 hours 04/02/21 1238     04/02/21 1215  anidulafungin (ERAXIS) 200 mg in sodium chloride 0.9 % 200 mL IVPB  Status:  Discontinued        200 mg 78 mL/hr over 200 Minutes Intravenous Every 24 hours 04/02/21 1116 04/02/21 1117   04/02/21 1215  anidulafungin (ERAXIS) 200 mg in sodium chloride 0.9 % 200 mL IVPB        200 mg 78 mL/hr over 200 Minutes Intravenous  Once 04/02/21 1116 04/02/21 1545   04/01/21 2206  vancomycin variable dose per unstable renal function (pharmacist dosing)  Status:  Discontinued         Does not apply See admin instructions 04/01/21 2206 04/02/21 1217   04/01/21 1830  vancomycin (VANCOREADY) IVPB 1500 mg/300 mL        1,500 mg 150 mL/hr over 120 Minutes Intravenous  Once 04/01/21 1736 04/01/21 2103   03/31/21 1800  ceFEPIme (MAXIPIME) 1 g in sodium chloride 0.9 % 100 mL IVPB  Status:  Discontinued        1 g 200 mL/hr over 30 Minutes Intravenous Every 24 hours 03/30/21 1437 04/02/21 1216   03/29/21 1300  vancomycin (VANCOREADY) IVPB 1000 mg/200 mL  Status:  Discontinued        1,000 mg 200 mL/hr over 60 Minutes Intravenous Every 24 hours 03/28/21 1256 03/29/21 1057   03/28/21 1345  vancomycin (VANCOREADY) IVPB 1500 mg/300 mL        1,500 mg 150 mL/hr over 120 Minutes Intravenous  Once 03/28/21 1256 03/28/21 1622   03/28/21 1345  ceFEPIme  (MAXIPIME) 2 g in sodium chloride 0.9 % 100 mL IVPB  Status:  Discontinued        2 g 200 mL/hr over 30 Minutes Intravenous Every 12 hours 03/28/21 1256 03/30/21 1437   04/17/2021 1000  levofloxacin (LEVAQUIN) IVPB 750 mg        750 mg 100 mL/hr over 90 Minutes Intravenous Every 24 hours 03/22/2021 1300 04/12/2021 1445   03/02/2021 2000  vancomycin (VANCOCIN) IVPB 1000 mg/200 mL premix  Status:  Discontinued        1,000 mg 200 mL/hr over 60 Minutes Intravenous  Once 02/25/2021 1300 03/03/2021 1307   02/27/2021 0903  vancomycin (VANCOCIN) powder  Status:  Discontinued          As needed 02/21/2021 0904 03/13/2021 1241   03/08/2021 0400  vancomycin (VANCOREADY) IVPB 1250 mg/250 mL        1,250 mg 166.7 mL/hr over 90 Minutes Intravenous To Surgery 03/22/21 1012 03/06/2021 0923   03/22/2021 0400  ceFAZolin (ANCEF) IVPB 2g/100 mL premix        2 g  200 mL/hr over 30 Minutes Intravenous To Surgery 03/22/21 1012 03/08/2021 1106   03/22/2021 0400  ceFAZolin (ANCEF) IVPB 2g/100 mL premix  Status:  Discontinued        2 g 200 mL/hr over 30 Minutes Intravenous To Surgery 03/22/21 1012 02/25/2021 1249   03/17/2021 0400  vancomycin (VANCOREADY) IVPB 1250 mg/250 mL  Status:  Discontinued        1,250 mg 166.7 mL/hr over 90 Minutes Intravenous To Surgery 03/12/2021 0806 03/21/2021 1322   03/08/2021 0400  ceFAZolin (ANCEF) IVPB 2g/100 mL premix  Status:  Discontinued        2 g 200 mL/hr over 30 Minutes Intravenous To Surgery 03/05/2021 0806 03/01/2021 1322   03/05/2021 0400  ceFAZolin (ANCEF) IVPB 2g/100 mL premix  Status:  Discontinued        2 g 200 mL/hr over 30 Minutes Intravenous To Surgery 03/18/2021 0806 02/23/2021 1322       Medications: Scheduled Meds:  aspirin  81 mg Per Tube Daily   atorvastatin  40 mg Per Tube Daily   bisacodyl  10 mg Oral Daily   Or   bisacodyl  10 mg Rectal Daily   chlorhexidine gluconate (MEDLINE KIT)  15 mL Mouth Rinse BID   Chlorhexidine Gluconate Cloth  6 each Topical Daily   darbepoetin (ARANESP)  injection - NON-DIALYSIS  60 mcg Subcutaneous Q Thu-1800   docusate  200 mg Per Tube Daily   feeding supplement (PROSource TF)  45 mL Per Tube QID   insulin aspart  0-24 Units Subcutaneous Q4H   insulin glargine  10 Units Subcutaneous Daily   ipratropium-albuterol  3 mL Nebulization QID   mouth rinse  15 mL Mouth Rinse 10 times per day   metoCLOPramide (REGLAN) injection  10 mg Intravenous Q8H   midodrine  15 mg Per Tube TID WC   multivitamin  1 tablet Per Tube QHS   pantoprazole sodium  40 mg Per Tube Daily   polyethylene glycol  17 g Per Tube Daily   sodium chloride flush  10-40 mL Intracatheter Q12H   sodium chloride flush  3 mL Intravenous Q12H   Thrombi-Pad  1 each Topical Once   Continuous Infusions:   prismasol BGK 4/2.5 500 mL/hr at 04/04/21 0124    prismasol BGK 4/2.5 300 mL/hr at 04/04/21 1310   sodium chloride     sodium chloride Stopped (04/02/21 2123)   amiodarone 30 mg/hr (04/04/21 1400)   anidulafungin Stopped (04/04/21 1110)   ceFEPime (MAXIPIME) IV Stopped (04/03/21 1624)   feeding supplement (VITAL 1.5 CAL) 20 mL/hr at 04/04/21 1100   fentaNYL infusion INTRAVENOUS 250 mcg/hr (04/04/21 1400)   heparin 2,300 Units/hr (04/04/21 1400)   lactated ringers     lactated ringers Stopped (04/01/21 1902)   metronidazole Stopped (04/04/21 1331)   norepinephrine (LEVOPHED) Adult infusion 12 mcg/min (04/04/21 1400)   prismasol BGK 4/2.5 1,500 mL/hr at 04/04/21 1230   vasopressin 0.03 Units/min (04/04/21 1400)   PRN Meds:.heparin, lactated ringers, lidocaine (PF), lip balm, metoprolol tartrate, morphine injection, ondansetron (ZOFRAN) IV, oxyCODONE, sodium chloride, sodium chloride flush, sodium chloride flush    Objective: Weight change:   Intake/Output Summary (Last 24 hours) at 04/04/2021 1440 Last data filed at 04/04/2021 1400 Gross per 24 hour  Intake 2730.46 ml  Output 6369 ml  Net -3638.54 ml    Blood pressure 122/62, pulse 99, temperature 98.8 F (37.1 C),  temperature source Oral, resp. rate (!) 34, height '6\' 2"'  (1.88 m), weight 75.7 kg,  SpO2 100 %. Temp:  [95.7 F (35.4 C)-98.8 F (37.1 C)] 98.8 F (37.1 C) (06/12 1122) Pulse Rate:  [89-104] 99 (06/12 1400) Resp:  [27-34] 34 (06/12 1400) BP: (80-122)/(39-74) 122/62 (06/12 1400) SpO2:  [98 %-100 %] 100 % (06/12 1424) FiO2 (%):  [40 %] 40 % (06/12 1424)  Physical Exam: Physical Exam Constitutional:      Appearance: He is ill-appearing and toxic-appearing.     Interventions: He is intubated.  HENT:     Head: Normocephalic and atraumatic.  Eyes:     Extraocular Movements: Extraocular movements intact.  Cardiovascular:     Rate and Rhythm: Tachycardia present.     Heart sounds: No murmur heard.   No friction rub. No gallop.  Pulmonary:     Effort: He is intubated.     Breath sounds: No wheezing.  Abdominal:     General: Bowel sounds are normal. There is no distension.     Palpations: There is no mass.     Tenderness: There is no abdominal tenderness.  Skin:    Coloration: Skin is pale.    Sternal wound is clean CBC:    BMET Recent Labs    04/03/21 1550 04/04/21 0123  NA 134* 135  K 4.7 4.4  CL 101 102  CO2 22 22  GLUCOSE 97 111*  BUN 26* 22*  CREATININE 2.14* 1.92*  CALCIUM 7.4* 7.6*      Liver Panel  Recent Labs    04/03/21 0021 04/03/21 1550 04/04/21 0123  PROT 5.3*  --  5.7*  ALBUMIN 2.0* 2.1* 2.1*  AST 30  --  40  ALT 19  --  24  ALKPHOS 200*  --  198*  BILITOT 2.3*  --  2.1*        Sedimentation Rate No results for input(s): ESRSEDRATE in the last 72 hours. C-Reactive Protein No results for input(s): CRP in the last 72 hours.  Micro Results: Recent Results (from the past 720 hour(s))  Surgical PCR screen     Status: None   Collection Time: 02/23/2021  2:37 PM   Specimen: Nasopharyngeal Swab; Nasal Swab  Result Value Ref Range Status   MRSA, PCR NEGATIVE NEGATIVE Final   Staphylococcus aureus NEGATIVE NEGATIVE Final    Comment:  (NOTE) The Xpert SA Assay (FDA approved for NASAL specimens in patients 11 years of age and older), is one component of a comprehensive surveillance program. It is not intended to diagnose infection nor to guide or monitor treatment. Performed at [facility], [place] [address], [place], [place] [address]   SARS CORONAVIRUS 2 (TAT 6-24 HRS) Nasopharyngeal Nasopharyngeal Swab     Status: None   Collection Time: 03/06/2021  3:12 PM   Specimen: Nasopharyngeal Swab  Result Value Ref Range Status   SARS Coronavirus 2 NEGATIVE NEGATIVE Final    Comment: (NOTE) SARS-CoV-2 target nucleic acids are NOT DETECTED.  The SARS-CoV-2 RNA is generally detectable in upper and lower respiratory specimens during the acute phase of infection. Negative results do not preclude SARS-CoV-2 infection, do not rule out co-infections with other pathogens, and should not be used as the sole basis for treatment or other patient management decisions. Negative results must be combined with clinical observations, patient history, and epidemiological information. The expected result is Negative.  Fact Sheet for Patients: SugarRoll.be  Fact Sheet for Healthcare Providers: https://www.woods-mathews.com/  This test is not yet approved or cleared by the [place] FDA and  has been authorized for  detection and/or diagnosis of SARS-CoV-2 by FDA under an Emergency Use Authorization (EUA). This EUA will remain  in effect (meaning this test can be used) for the duration of the COVID-19 declaration under Se ction 564(b)(1) of the Act, 21 U.S.C. section 360bbb-3(b)(1), unless the authorization is terminated or revoked sooner.  Performed at [facility], [place] [address], [place], [place] [address]   Surgical pcr screen     Status: None   Collection Time: 02/28/2021  2:43 AM   Specimen: Nasal Mucosa; Nasal Swab  Result Value Ref Range Status   MRSA, PCR NEGATIVE  NEGATIVE Final   Staphylococcus aureus NEGATIVE NEGATIVE Final    Comment: (NOTE) The Xpert SA Assay (FDA approved for NASAL specimens in patients 56 years of age and older), is one component of a comprehensive surveillance program. It is not intended to diagnose infection nor to guide or monitor treatment. Performed at [facility], [place] [address], [place], [place] [address]   MRSA PCR Screening     Status: None   Collection Time: 03/28/21  1:18 PM   Specimen: Nasopharyngeal  Result Value Ref Range Status   MRSA by PCR NEGATIVE NEGATIVE Final    Comment:        The GeneXpert MRSA Assay (FDA approved for NASAL specimens only), is one component of a comprehensive MRSA colonization surveillance program. It is not intended to diagnose MRSA infection nor to guide or monitor treatment for MRSA infections. Performed at [facility], [place] [address] [place], [place] [address]   Culture, Urine     Status: None   Collection Time: 04/02/21  8:17 AM   Specimen: Urine, Random  Result Value Ref Range Status   Specimen Description URINE, RANDOM  Final   Special Requests NONE  Final   Culture   Final    NO GROWTH Performed at [facility], [place] [address], [place], [place] [address]    Report Status 04/03/2021 FINAL  Final  Culture, blood (routine x 2)     Status: None (Preliminary result)   Collection Time: 04/02/21 11:44 AM   Specimen: BLOOD LEFT HAND  Result Value Ref Range Status   Specimen Description BLOOD LEFT HAND  Final   Special Requests   Final    BOTTLES DRAWN AEROBIC ONLY Blood Culture results may not be optimal due to an inadequate volume of blood received in culture bottles   Culture   Final    NO GROWTH 2 DAYS Performed at [facility], [place] [address], [place], [place] [address]    Report Status PENDING  Incomplete  Expectorated Sputum Assessment w Gram Stain, Rflx to Resp Cult     Status: None   Collection Time: 04/02/21  3:19 PM    Specimen: Expectorated Sputum  Result Value Ref Range Status   Specimen Description EXPECTORATED SPUTUM  Final   Special Requests NONE  Final   Sputum evaluation   Final    THIS SPECIMEN IS ACCEPTABLE FOR SPUTUM CULTURE Performed at [facility], [place] [address], [place], [place] [address]    Report Status 04/03/2021 FINAL  Final  Culture, Respiratory w Gram Stain     Status: None (Preliminary result)   Collection Time: 04/02/21  3:19 PM  Result Value Ref Range Status   Specimen Description EXPECTORATED SPUTUM  Final   Special Requests NONE Reflexed from [id number]  Final   Gram Stain   Final    MODERATE  WBC PRESENT,BOTH PMN AND MONONUCLEAR ABUNDANT YEAST    Culture   Final    CULTURE REINCUBATED FOR BETTER GROWTH Performed at [facility], [place] [address] [place], [place] [address]    Report Status PENDING  Incomplete    Studies/Results: CT ABDOMEN PELVIS WO CONTRAST  Result Date: 04/03/2021 CLINICAL DATA:  Right upper quadrant pain, leukocytosis, Murphy sign EXAM: CT ABDOMEN AND PELVIS WITHOUT CONTRAST TECHNIQUE: Multidetector CT imaging of the abdomen and pelvis was performed following the standard protocol without IV contrast. COMPARISON:  CTa chest 04/01/2021 FINDINGS: Lower chest: Multifocal patchy ground-glass and consolidative opacities in the lung bases with partial atelectatic collapse of both lower lobes. Bilateral pleural effusions as well including some questionable loculation of the left effusion. Borderline cardiomegaly. 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 Postsurgical changes from prior sternotomy. Hepatobiliary: Lobular hepatic surface contour. Hypertrophy of the left lobe and caudate. No concerning focal liver lesion is seen. Few vascular calcifications. Hyperdense material layering within the moderately distended gallbladder, possible vicarious extravasation of contrast from recent CT imaging  versus layering biliary sludge. No discernible gallstones. No biliary ductal dilatation. Pancreas: Extensive fatty replacement of the pancreas. No pancreatic ductal dilatation or surrounding inflammatory changes. Spleen: Punctate calcifications throughout the spleen likely reflecting granulomata. Normal splenic size. No concerning focal splenic lesion. Adrenals/Urinary Tract: Normal adrenals. Moderate bilateral perinephric stranding is symmetric and nonspecific. Appearance is similar to comparison CT imaging extensive vascular calcifications. No hydronephrosis or evidence of obstructive uropathy. Hyperdense material layering within the partially decompressed urinary bladder likely reflecting excreted contrast media. Bladder wall thickening is nonspecific given underdistention. Stomach/Bowel: Transesophageal tube tip and side port distal to the GE junction, terminating near the gastric antrum. Mild mural thickening and stranding about the terminal ileum as well as diffusely throughout the colonic segments. No evidence of bowel obstruction. Normal caliber appendix in the right quadrant. Vascular/Lymphatic: Extensive atherosclerotic calcification of the abdominal aorta and branch vessels. No aneurysm or ectasia. No other gross complication limitations of an unenhanced CT. Reproductive: Coarse eccentric calcification of the prostate. No concerning abnormalities of the prostate or seminal vesicles. High-riding appearance of the bilateral testes with small hydroceles. Other: Small moderate volume ascites particularly in the right upper quadrant and layering in the deep pelvis pericolic gutter. Extensive circumferential body wall edema. No free intraperitoneal air. Fat and fluid containing left paraumbilical hernia. No bowel containing hernia. Musculoskeletal: No acute osseous abnormality or suspicious osseous lesion. Multilevel degenerative changes are present in the imaged portions of the spine. Posterior spurring and  calcified disc bulge L5-S1 resulting in severe canal stenosis and bilateral foraminal narrowing. IMPRESSION: 1. Stigmata of cirrhosis with a nodular liver surface contour as well as hypertrophy of the left lobe and caudate. 2. Features of developing anasarca with moderate ascites and extensive circumferential body wall edema as well as bilateral pleural effusions. 3. Moderate distention of the gallbladder with layering hyperdense material which could reflect vicarious extravasation of contrast given recent contrast enhanced exams. No gallbladder wall thickening or biliary ductal dilatation is seen however if there is persisting clinical concern, could consider right upper quadrant ultrasound. 4. Circumferential thickening of the terminal ileum and diffusely throughout the colon, nonspecific in the setting of intrinsic liver disease. Could reflect portal enteropathy/colopathy versus ileocolitis in the appropriate clinical setting. 5. Patchy opacities in bilateral lung bases as well as densely atelectatic bilateral lower lobes, could reflect a combination of atelectasis, underlying airspace disease/infection and/or edema. Bilateral effusions  with some questionable loculation of the left effusion as well. 6. Mild cardiomegaly with three-vessel coronary disease in postsurgical changes from sternotomy and CABG. Electronically Signed   By: Lovena Le M.D.   On: 04/03/2021 00:24   DG CHEST PORT 1 VIEW  Result Date: 04/04/2021 CLINICAL DATA:  Status post CABG. EXAM: PORTABLE CHEST 1 VIEW COMPARISON:  04/03/2021 FINDINGS: A feeding tube extends beyond the inferior aspect of the film. Endotracheal tube is poorly visualized, likely terminating at the level of the clavicles, approximately 6.5 cm above the carina. Prior median sternotomy. Numerous leads and wires project over the chest. Midline trachea. Mild cardiomegaly. Small left pleural effusion is similar. No pneumothorax. Diffuse pulmonary interstitial thickening.  Improved left base airspace disease. IMPRESSION: Similar small left pleural effusion and adjacent Airspace disease, likely atelectasis. No pneumothorax or other acute superimposed finding. Electronically Signed   By: Abigail Miyamoto M.D.   On: 04/04/2021 08:03   DG CHEST PORT 1 VIEW  Result Date: 04/03/2021 CLINICAL DATA:  Respiratory dependent, evaluate endotracheal tube EXAM: PORTABLE CHEST 1 VIEW COMPARISON:  04/02/2021 FINDINGS: There is an endotracheal tube overlying the midthoracic trachea. There is a nasogastric tube coursing below the diaphragm, tip excluded by collimation. Right neck catheter has been removed since the prior exam. Unchanged cardiomediastinal silhouette. There is a small left pleural effusion with adjacent left basilar consolidation. Small right pleural effusion. Diffuse mild interstitial opacities. There is no visible pneumothorax. Bones are unchanged. IMPRESSION: Persistent left greater than right pleural effusions with adjacent basilar atelectasis. Lines and tubes as described above. Right neck catheter has been removed since the prior exam. Electronically Signed   By: Maurine Simmering   On: 04/03/2021 10:10   DG Abd Portable 1V  Result Date: 04/04/2021 CLINICAL DATA:  Postop.  Follow-up. EXAM: PORTABLE ABDOMEN - 1 VIEW COMPARISON:  CT 04/03/2021.  Plain film of 04/02/2021 FINDINGS: Feeding tube has been minimally advanced, terminating at the antrum/pyloric region. A left femoral catheter is identified. Nonobstructive bowel gas pattern. Vascular calcifications. IMPRESSION: Feeding tube terminating at the gastric antrum or pylorus. Electronically Signed   By: Abigail Miyamoto M.D.   On: 04/04/2021 08:04   US Abdomen Limited RUQ (LIVER/GB)  Result Date: 04/03/2021 CLINICAL DATA:  Sepsis. EXAM: ULTRASOUND ABDOMEN LIMITED RIGHT UPPER QUADRANT COMPARISON:  CT scan April 02, 2021 FINDINGS: Gallbladder: Sludge in the gallbladder. The gallbladder wall measures up to 4.4 mm. No stones or Murphy's  sign reported. Common bile duct: Diameter: 3.1 mm Liver: The liver demonstrates a nodular contour with no focal mass. Portal vein is patent on color Doppler imaging with normal direction of blood flow towards the liver. Other: Right pleural effusion.  Ascites. IMPRESSION: 1. Sludge in the gallbladder. Mild gallbladder wall thickening. No Murphy's sign or stone identified. 2. Nodular contour to the liver suggesting cirrhosis. 3. Right pleural effusion and ascites. Electronically Signed   By: Dorise Bullion III M.D   On: 04/03/2021 18:13      Assessment/Plan:  INTERVAL HISTORY:   White blood cell count came down slightly  Active Problems:   Acute on chronic systolic heart failure (HCC)   S/P CABG x 4   Protein-calorie malnutrition, severe   Acute respiratory failure with hypoxia (HCC)   Postoperative hypovolemic shock   Chest tube in place   Chronic kidney disease    David Rose. is a 55 y.o. male with coronary artery status post coronary bypass grafting x4 complicated by cardiogenic and hemorrhagic shock with concern  for acute CVA, requiring reintubation and sedation and pressors.  He then developed dramatically worsening leukocytosis with white blood cell count jumping from 28,000-87,800  .  CT of the abdomen and pelvis raise question of possible ileal colitis.  He still has pleural effusions  He has been broadened to cefepime Flagyl and also Eraxis (though I do not see a clear target for this antifungal--the yeast in the lungs is surely not pathogenic)  WBC down now a bit and better slightly relatively speaking.   Family at bedside told me that he has been squeezing their hand in response to questions. I am okay with continuing current antimicrobials and following cultures.  Would consider therapeutic and diagnostic thoracentesis   I spent more than 35 minutes with the patient including greater than 50% of time in face to face counseling of the patient personally reviewing  radiographs, long with pertinent laboratory microbiological data review of medical records and in coordination of his care.  Dr. Linus Salmons will be back tomorrow.   LOS: 20 days   David Rose 04/04/2021, 2:40 PM

## 2021-04-05 DIAGNOSIS — J9602 Acute respiratory failure with hypercapnia: Secondary | ICD-10-CM

## 2021-04-05 DIAGNOSIS — J9601 Acute respiratory failure with hypoxia: Secondary | ICD-10-CM | POA: Diagnosis not present

## 2021-04-05 LAB — BLOOD CULTURE ID PANEL (REFLEXED) - BCID2

## 2021-04-05 LAB — CULTURE, RESPIRATORY W GRAM STAIN: Culture: NORMAL

## 2021-04-05 LAB — RENAL FUNCTION PANEL
Albumin: 2 g/dL — ABNORMAL LOW (ref 3.5–5.0)
Albumin: 1.9 g/dL — ABNORMAL LOW (ref 3.5–5.0)
Anion gap: 11 (ref 5–15)
Anion gap: 9 (ref 5–15)
BUN: 15 mg/dL (ref 6–20)
BUN: 13 mg/dL (ref 6–20)
CO2: 23 mmol/L (ref 22–32)
CO2: 26 mmol/L (ref 22–32)
Calcium: 7.8 mg/dL — ABNORMAL LOW (ref 8.9–10.3)
Calcium: 8 mg/dL — ABNORMAL LOW (ref 8.9–10.3)
Chloride: 102 mmol/L (ref 98–111)
Chloride: 101 mmol/L (ref 98–111)
Creatinine, Ser: 1.39 mg/dL — ABNORMAL HIGH (ref 0.61–1.24)
Creatinine, Ser: 1.25 mg/dL — ABNORMAL HIGH (ref 0.61–1.24)
GFR, Estimated: 60 mL/min — ABNORMAL LOW (ref >60)
GFR, Estimated: >60 mL/min (ref >60)
Glucose, Bld: 149 mg/dL — ABNORMAL HIGH (ref 70–99)
Glucose, Bld: 134 mg/dL — ABNORMAL HIGH (ref 70–99)
Phosphorus: 1.4 mg/dL — ABNORMAL LOW (ref 2.5–4.6)
Phosphorus: 1.1 mg/dL — ABNORMAL LOW (ref 2.5–4.6)
Potassium: 4.1 mmol/L (ref 3.5–5.1)
Potassium: 4 mmol/L (ref 3.5–5.1)
Sodium: 136 mmol/L (ref 135–145)
Sodium: 136 mmol/L (ref 135–145)

## 2021-04-05 LAB — POCT I-STAT 7, (LYTES, BLD GAS, ICA,H+H)
Acid-Base Excess: 0 mmol/L (ref 0.0–2.0)
Bicarbonate: 24.1 mmol/L (ref 20.0–28.0)
Calcium, Ion: 1.16 mmol/L (ref 1.15–1.40)
HCT: 28 % — ABNORMAL LOW (ref 39.0–52.0)
Hemoglobin: 9.5 g/dL — ABNORMAL LOW (ref 13.0–17.0)
O2 Saturation: 90 %
Patient temperature: 99.7
Potassium: 3.9 mmol/L (ref 3.5–5.1)
Sodium: 138 mmol/L (ref 135–145)
TCO2: 25 mmol/L (ref 22–32)
pCO2 arterial: 37.5 mmHg (ref 32.0–48.0)
pH, Arterial: 7.42 (ref 7.350–7.450)
pO2, Arterial: 60 mmHg — ABNORMAL LOW (ref 83.0–108.0)

## 2021-04-05 LAB — CBC
HCT: 25.1 % — ABNORMAL LOW (ref 39.0–52.0)
Hemoglobin: 8.3 g/dL — ABNORMAL LOW (ref 13.0–17.0)
MCH: 30.2 pg (ref 26.0–34.0)
MCHC: 33.1 g/dL (ref 30.0–36.0)
MCV: 91.3 fL (ref 80.0–100.0)
Platelets: 251 10*3/uL (ref 150–400)
RBC: 2.75 MIL/uL — ABNORMAL LOW (ref 4.22–5.81)
RDW: 21.3 % — ABNORMAL HIGH (ref 11.5–15.5)
WBC: 44.8 10*3/uL — ABNORMAL HIGH (ref 4.0–10.5)
nRBC: 0.1 % (ref 0.0–0.2)

## 2021-04-05 LAB — GLUCOSE, CAPILLARY
Glucose-Capillary: 129 mg/dL — ABNORMAL HIGH (ref 70–99)
Glucose-Capillary: 144 mg/dL — ABNORMAL HIGH (ref 70–99)
Glucose-Capillary: 114 mg/dL — ABNORMAL HIGH (ref 70–99)
Glucose-Capillary: 115 mg/dL — ABNORMAL HIGH (ref 70–99)
Glucose-Capillary: 112 mg/dL — ABNORMAL HIGH (ref 70–99)
Glucose-Capillary: 140 mg/dL — ABNORMAL HIGH (ref 70–99)

## 2021-04-05 LAB — HEPARIN LEVEL (UNFRACTIONATED): Heparin Unfractionated: 0.11 IU/mL — ABNORMAL LOW (ref 0.30–0.70)

## 2021-04-05 LAB — ANTITHROMBIN III: AntiThromb III Func: 25 % — ABNORMAL LOW (ref 75–120)

## 2021-04-05 LAB — MAGNESIUM: Magnesium: 2.5 mg/dL — ABNORMAL HIGH (ref 1.7–2.4)

## 2021-04-05 MED ORDER — AMIODARONE HCL 200 MG PO TABS
200.0000 mg | ORAL_TABLET | Freq: Two times a day (BID) | ORAL | Status: DC
  Administered 2021-04-05 – 2021-04-12 (×15): 200 mg
  Filled 2021-04-05 (×15): qty 1

## 2021-04-05 MED ORDER — SODIUM PHOSPHATES 45 MMOLE/15ML IV SOLN
30.0000 mmol | Freq: Once | INTRAVENOUS | Status: AC
  Administered 2021-04-05: 30 mmol via INTRAVENOUS
  Filled 2021-04-05: qty 10

## 2021-04-05 MED ORDER — SODIUM PHOSPHATES 45 MMOLE/15ML IV SOLN: 10.0000 mmol | Freq: Once | INTRAVENOUS | Status: DC

## 2021-04-05 MED ORDER — SORBITOL 70 % SOLN
30.0000 mL | Freq: Once | Status: AC
  Administered 2021-04-05: 30 mL
  Filled 2021-04-05: qty 30

## 2021-04-05 NOTE — Progress Notes (Signed)
[place] [facility] Progress Note Patient Name: David Rose. DOB: 1966/01/13 MRN: [id number]   Date of Service  04/05/2021  HPI/Events of Note  Request for renewal of restraints in this intubated patient who self-extubated yesterday.   eICU Interventions  Renewed bilateral soft wrist restraints.     Intervention Category Minor Interventions: Agitation / anxiety - evaluation and management  Charlott Rakes 04/05/2021, 11:04 PM

## 2021-04-05 NOTE — Progress Notes (Signed)
Patient ID: David Rose., male   DOB: [birth year], 55 y.o.   MRN: [id number]      Advanced Heart Failure Rounding Note  PCP-Cardiologist: Carlyle Dolly, MD    Patient Profile   55 y/o male w/  history of ESRD due to DM and CHF with mid-range EF (LV EF 40-45% with moderately decreased RV systolic function and PASP 99 on 4/22 echo) as well as cirrhosis of uncertain etiology.  Based on low EF and elevated PA pressure, right and left heart cath was done in 5/22.  This showed severe 3VD, high cardiac output with moderate pulmonary hypertension but low PVR.  He is planned for CABG, but admitted pre-op for optimization.    Subjective:    CABG x 4 on 5/31 with LIMA-LAD, SVG-RCA, SVG-OM, SVG-D.   Coagulopathic post-op with multiple blood products.  Extubated post-op but developed respiratory distress => PEA arrest then VT. Reintubated.  ROSC with ACLS.  Returned to OR for mediastinal exploration 6/1.  Extubated 6/2.   6/6 Midodrine started.   6/9 AMS--> CT negative for acute bleed or ischemic CVA. Intubated. Suspected septic shock.  CTA chest showed no PE, moderate bilateral pleural effusions. Re-intubated and CVVH restarted. CT abdomen/pelvis: anasarca, bilateral pleural effusions and lung base opacities, thickened colon/terminal ileum (?due to cirrhosis, ?ileocolitis).   Remains intubated/sedated. On CVVHD, aiming for UF net negative 50-100 cc/hr.    On Norepi 12 + vaso 0.03 units.   On Cefepime, Flagyl, Eraxis for HCAP/septic shock.  WBCs 88 => 82 => 71 => 45K. ID following.   He remains in atrial flutter, on heparin gtt and amiodarone.    Echo (6/9) with EF 45-50%, inferolateral severe hypokinesis, moderate RV dysfunction.    Objective:   Weight Range: 75.7 kg Body mass index is 21.43 kg/m.   Vital Signs:   Temp:  [98 F (36.7 C)-99.2 F (37.3 C)] 99.2 F (37.3 C) (06/13 0729) Pulse Rate:  [85-100] 93 (06/13 0700) Resp:  [15-34] 34 (06/13 0700) BP: (82-132)/(42-85)  105/54 (06/13 0700) SpO2:  [90 %-100 %] 99 % (06/13 0700) FiO2 (%):  [40 %] 40 % (06/13 0405) Last BM Date: 03/31/21  Weight change: Filed Weights   03/31/21 0647 04/01/21 0500 04/02/21 0452  Weight: 75.8 kg 78.3 kg 75.7 kg    Intake/Output:   Intake/Output Summary (Last 24 hours) at 04/05/2021 0737 Last data filed at 04/05/2021 0700 Gross per 24 hour  Intake 2836.38 ml  Output 3922 ml  Net -1085.62 ml      Physical Exam   General: Sedated on vent Neck: JVP 10 cm, no thyromegaly or thyroid nodule.  Lungs: Decreased left base.  CV: Nondisplaced PMI.  Heart regular S1/S2, no S3/S4, no murmur.  1+ edema lower legs.  Abdomen: Soft, nontender, no hepatosplenomegaly, no distention.  Skin: Intact without lesions or rashes.  Neurologic: Per nursing, will follow commands when awakens.  Extremities: No clubbing or cyanosis.  HEENT: Normal.    Telemetry   Atypical flutter 90s. Personally reviewed    Labs    CBC Recent Labs    04/03/21 0021 04/04/21 0123 04/05/21 0401  WBC 82.3* 71.3* 44.8*  NEUTROABS 75.2* 64.6*  --   HGB 8.8* 9.1* 8.3*  HCT 26.2* 27.0* 25.1*  MCV 91.3 89.1 91.3  PLT 256 291 655   Basic Metabolic Panel Recent Labs    04/04/21 0123 04/04/21 1530 04/05/21 0401  NA 135 137 136  K 4.4 4.1 4.1  CL 102 104 102  CO2 _0 GLUCOSE 111* 100* 149*  BUN 22* 16 15  CREATININE 1.92* 1.56* 1.39*  CALCIUM 7.6* 7.9* 7.8*  MG 2.4  --  2.5*  PHOS  --  1.8* 1.4*   Liver Function Tests Recent Labs    04/03/21 0021 04/03/21 1550 04/04/21 0123 04/04/21 1530 04/05/21 0401  AST 30  --  40  --   --   ALT 19  --  24  --   --   ALKPHOS 200*  --  198*  --   --   BILITOT 2.3*  --  2.1*  --   --   PROT 5.3*  --  5.7*  --   --   ALBUMIN 2.0*   < > 2.1* 2.0* 2.0*   < > = values in this interval not displayed.   No results for input(s): LIPASE, AMYLASE in the last 72 hours. Cardiac Enzymes No results for input(s): CKTOTAL, CKMB, CKMBINDEX, TROPONINI  in the last 72 hours.  BNP: BNP (last 3 results) Recent Labs    03/11/2021 1545  BNP 1,127.8*    ProBNP (last 3 results) No results for input(s): PROBNP in the last 8760 hours.   D-Dimer No results for input(s): DDIMER in the last 72 hours.  Hemoglobin A1C No results for input(s): HGBA1C in the last 72 hours. Fasting Lipid Panel No results for input(s): CHOL, HDL, LDLCALC, TRIG, CHOLHDL, LDLDIRECT in the last 72 hours. Thyroid Function Tests No results for input(s): TSH, T4TOTAL, T3FREE, THYROIDAB in the last 72 hours.  Invalid input(s): FREET3  Other results:   Imaging    DG CHEST PORT 1 VIEW  Result Date: 04/04/2021 CLINICAL DATA:  ET tube EXAM: PORTABLE CHEST 1 VIEW COMPARISON:  04/04/2021 FINDINGS: Endotracheal tube remains in place with the tip approximately 6 cm above the carina. Prior CABG. Small left pleural effusion. Left lower lobe atelectasis or infiltrate. Heart is normal size. Right lung clear. IMPRESSION: Endotracheal tube approximately 6 cm above the carina. Left pleural effusion with left lower lobe atelectasis or infiltrate. Electronically Signed   By: Rolm Baptise M.D.   On: 04/04/2021 22:09      Medications:     Scheduled Medications:  amiodarone  200 mg Per Tube BID   aspirin  81 mg Per Tube Daily   atorvastatin  40 mg Per Tube Daily   bisacodyl  10 mg Oral Daily   Or   bisacodyl  10 mg Rectal Daily   chlorhexidine gluconate (MEDLINE KIT)  15 mL Mouth Rinse BID   Chlorhexidine Gluconate Cloth  6 each Topical Daily   darbepoetin (ARANESP) injection - NON-DIALYSIS  60 mcg Subcutaneous Q Thu-1800   docusate  200 mg Per Tube Daily   EPINEPHrine       feeding supplement (PROSource TF)  45 mL Per Tube QID   insulin aspart  0-24 Units Subcutaneous Q4H   insulin glargine  10 Units Subcutaneous Daily   ipratropium-albuterol  3 mL Nebulization QID   mouth rinse  15 mL Mouth Rinse 10 times per day   metoCLOPramide (REGLAN) injection  10 mg Intravenous  Q8H   midodrine  15 mg Per Tube TID WC   multivitamin  1 tablet Per Tube QHS   pantoprazole sodium  40 mg Per Tube Daily   polyethylene glycol  17 g Per Tube Daily   sodium chloride flush  10-40 mL Intracatheter Q12H   sodium chloride flush  3 mL Intravenous Q12H   Thrombi-Pad  1 each Topical Once    Infusions:   prismasol BGK 4/2.5 500 mL/hr at 04/05/21 0230    prismasol BGK 4/2.5 300 mL/hr at 04/05/21 0230   sodium chloride     sodium chloride Stopped (04/02/21 2123)   anidulafungin Stopped (04/04/21 1110)   ceFEPime (MAXIPIME) IV Stopped (04/04/21 2221)   feeding supplement (VITAL 1.5 CAL) 1,000 mL (04/04/21 2006)   fentaNYL infusion INTRAVENOUS 200 mcg/hr (04/05/21 6644)   heparin 2,500 Units/hr (04/05/21 0548)   lactated ringers     lactated ringers Stopped (04/01/21 1902)   metronidazole 500 mg (04/05/21 0509)   norepinephrine (LEVOPHED) Adult infusion 10 mcg/min (04/05/21 0500)   prismasol BGK 4/2.5 1,500 mL/hr at 04/05/21 0230   vasopressin 0.03 Units/min (04/05/21 0509)    PRN Medications: heparin, lactated ringers, lidocaine (PF), lip balm, metoprolol tartrate, morphine injection, ondansetron (ZOFRAN) IV, oxyCODONE, sodium chloride, sodium chloride flush, sodium chloride flush     Assessment/Plan   1. CAD: Severe 3VD with decreased EF.  I reviewed films with Dr. Ellyn Hack, PCI would be possible but would be difficult/high risk with multiple lesions and heavy calcification. 5/31 LIMA-LAD, SVG-RCA, SVG-OM, SVG-D. No s/s ischemic currently  - ASA 81 + atorvastatin 40 mg daily.  2. Acute/chronic HF with mid range EF => cardiogenic shock post-CABG: Suspect ischemic cardiomyopathy.  Echo 4/22 with EF 40-45% with moderately decreased RV systolic function and PASP 99, moderate TR. There was a prominent component of RV failure with RA pressure elevated out of proportion to PCWP (CVP/PCWP 0.875 on RHC) but PAPI adequate at 2.8. Post-op had PEA arrest then VT.  On 6/9, episode of  altered mental status, progressive hypotension, and intubated.  Suspect septic shock now.  Echo (6/9) with EF 45-50%, inferolateral severe hypokinesis, moderate RV dysfunction. On NE 12 mcg + vasopressin 0.03 units.  Weight coming down, getting close to baseline.  - CVVH ongoing, continue UF negative 50-100 cc/hr today.  - Continue to wean pressors as able.  - Continue midodrine 3. ESRD: Suspected due to diabetes.  Volume up post-op with multiple blood products.  - CVVH ongoing, will aim for net negative UF 50-100 cc/hr.  - Renal following  4. 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  The CO was not markedly high.  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 Oxygen saturation was 99% on RA pre-op, so no evidence for hepatopulmonary syndrome.   - Sildenafil has been on hold with pressors.  5. Cirrhosis: Noted on abdominal US from 2020.  H/o paracentesis.  Had workup at [facility] (though patient does not remember this), viral hepatitis labs negative.  They ended up think that the cirrhosis was cardiogenic.  Repeat abdominal US 5/23 w/ nodular hepatic parenchymal pattern with increased echogenicity consistent with the patient's known cirrhosis. No focal hepatic abnormality identified. Portal vein is patent. Moderate ascites. GI has seen, patient had 4L paracentesis on 5/26. - He may eventually need repeat paracentesis.  6. Coagulopathy: Post-op bleeding in setting of coagulopathy post-CABG in patient with cirrhosis.  Currently seems controlled.  Platelets dropped then recovered, doubt HIT.  7. VT: Had after PEA arrest am 6/1.  - Transition amiodarone to per 200 bid per tube.  8. Anemia: ABLA.  Stable today. 9. Pleural effusions: ?hepatic hydrothorax.    - Left effusion on last CXR, ID recommend thoracentesis.     10. PEA arrest: Respiratory arrest post-extubation.  ROSC with CPR.  11.  Acute  hypoxemic respiratory failure:  Extubated 6/2, re-intubated 6/9. Self-extubated overnight 6/12 and re-intubated.  - Stable on vent, per CCM.  12. Atrial fibrillation: Atypical atrial flutter currently.   - Transition to amiodarone per tube.  - On heparin gtt - Eventual DCCV.  13. ID: Cefepime/Flagyl/Eraxis.   Suspected HCAP with septic shock.  WBC 88=>85=>41. Now afebrile. Cultures with Staph epidermidis 1/2, suspect contaminant.  - ID following, continue broad spectrum abx. Eraxis added 6/11 - ID recommending thoracentesis  14. Neuro: Altered mental status 6/9 with intubation.  CT head with no bleed or ischemic CVA seen.  EEG with diffuse encephalopathy.  RNs report he will wake up and intermittently follow commands.   CRITICAL CARE Performed by: Loralie Champagne  Total critical care time: 35 minutes  Critical care time was exclusive of separately billable procedures and treating other patients.  Critical care was necessary to treat or prevent imminent or life-threatening deterioration.  Critical care was time spent personally by me on the following activities: development of treatment plan with patient and/or surrogate as well as nursing, discussions with consultants, evaluation of patient's response to treatment, examination of patient, obtaining history from patient or surrogate, ordering and performing treatments and interventions, ordering and review of laboratory studies, ordering and review of radiographic studies, pulse oximetry and re-evaluation of patient's condition.  Loralie Champagne 04/05/2021 7:37 AM

## 2021-04-05 NOTE — Procedures (Addendum)
Intubation Procedure Note  David Rose  [id number]  1966-01-02  Date:04/05/21  Time:12:52 AM   Provider Performing:Jasmin Winberry    Procedure: Intubation ([id number])  Indication(s) Respiratory Failure Patient self-extubated.  Had been on fentanyl infusion. Following simple commands, somewhat arousable, but reportedly no cough no gag with deep suctioning. Sat initially low 90s, high 80s on NRB, then    High 80s with initial assistive bag mask ventilation. Decided to reintubate based on mental status, poor airway protection.    Consent Unable to obtain consent due to emergent nature of procedure.   Anesthesia Etomidate and Rocuronium   Time Out Verified patient identification, verified procedure, verified correct patient position, special equipment/implants available, medications/allergies/relevant history reviewed, required imaging and test results available.   Sterile Technique Usual hand hygeine, masks, and gloves were used   Procedure Description Patient positioned in bed supine.  Sedation given as noted above (etomidate, 20, roc 80).  Patient was intubated with endotracheal tube using Glidescope.  View was Grade 1 full glottis .  Number of attempts was 1.  Colorimetric CO2 detector was consistent with tracheal placement.   Complications/Tolerance None; patient tolerated the procedure well. Chest X-ray is ordered to verify placement.   EBL  None  Specimen(s) None

## 2021-04-05 NOTE — Progress Notes (Signed)
PHARMACY - PHYSICIAN COMMUNICATION CRITICAL VALUE ALERT - BLOOD CULTURE IDENTIFICATION (BCID)  David Schults. is an 55 y.o. male who presented to [facility] on 03/12/2021 with a chief complaint of heart failure  Assessment:  1/1 BC with Staph epi, Mec A detected, likely contaminant Name of physician (or Provider) Contacted: Streck  Current antibiotics: cefepime and eraxis  Changes to prescribed antibiotics recommended:  No changes  Results for orders placed or performed during the hospital encounter of 02/26/2021  Blood Culture ID Panel (Reflexed) (Collected: 04/02/2021 11:44 AM)  Result Value Ref Range   Enterococcus faecalis NOT DETECTED NOT DETECTED   Enterococcus Faecium NOT DETECTED NOT DETECTED   Listeria monocytogenes NOT DETECTED NOT DETECTED   Staphylococcus species DETECTED (A) NOT DETECTED   Staphylococcus aureus (BCID) NOT DETECTED NOT DETECTED   Staphylococcus epidermidis DETECTED (A) NOT DETECTED   Staphylococcus lugdunensis NOT DETECTED NOT DETECTED   Streptococcus species NOT DETECTED NOT DETECTED   Streptococcus agalactiae NOT DETECTED NOT DETECTED   Streptococcus pneumoniae NOT DETECTED NOT DETECTED   Streptococcus pyogenes NOT DETECTED NOT DETECTED   A.calcoaceticus-baumannii NOT DETECTED NOT DETECTED   Bacteroides fragilis NOT DETECTED NOT DETECTED   Enterobacterales NOT DETECTED NOT DETECTED   Enterobacter cloacae complex NOT DETECTED NOT DETECTED   Escherichia coli NOT DETECTED NOT DETECTED   Klebsiella aerogenes NOT DETECTED NOT DETECTED   Klebsiella oxytoca NOT DETECTED NOT DETECTED   Klebsiella pneumoniae NOT DETECTED NOT DETECTED   Proteus species NOT DETECTED NOT DETECTED   Salmonella species NOT DETECTED NOT DETECTED   Serratia marcescens NOT DETECTED NOT DETECTED   Haemophilus influenzae NOT DETECTED NOT DETECTED   Neisseria meningitidis NOT DETECTED NOT DETECTED   Pseudomonas aeruginosa NOT DETECTED NOT DETECTED   Stenotrophomonas maltophilia  NOT DETECTED NOT DETECTED   Candida albicans NOT DETECTED NOT DETECTED   Candida auris NOT DETECTED NOT DETECTED   Candida glabrata NOT DETECTED NOT DETECTED   Candida krusei NOT DETECTED NOT DETECTED   Candida parapsilosis NOT DETECTED NOT DETECTED   Candida tropicalis NOT DETECTED NOT DETECTED   Cryptococcus neoformans/gattii NOT DETECTED NOT DETECTED   Methicillin resistance mecA/C DETECTED (A) NOT DETECTED    [name] 04/05/2021  2:33 AM

## 2021-04-05 NOTE — Progress Notes (Signed)
PT Cancellation Note  Patient Details Name: David Rose. MRN: [id number] DOB: [birth year]   Cancelled Treatment:    Reason Eval/Treat Not Completed: Patient not medically ready (pt remains intubated with fem CRRT line and not currently appropriate)   Kyrielle Urbanski B Zain Bingman 04/05/2021, 8:57 AM Bayard Males, PT Acute Rehabilitation Services Pager: [phone] Office: [phone]

## 2021-04-05 NOTE — Progress Notes (Signed)
[place] Progress Note Patient Name: David Rose. DOB: October 13, 1966 MRN: [id number]   Date of Service  04/05/2021  HPI/Events of Note  Pharmacy notified me that the patient's Bcx from 6/10 grew S. Epidermidis in 1 of 2 sets only. MecA detected.  Last received vancomycin on 6/9. Currently on anidulafungin, cefepime and flagyl.  WBCs slightly lower the past two days and no fever since evening of 6/9-6/10. Pressor requirements coming down as well.   eICU Interventions  S.epidermidis in only one bottle is overwhelmingly most likely to be a contaminant.  Patient is clinically improving on current therapy.  No changes to antimicrobial therapy at this time.     Intervention Category Intermediate Interventions: Infection - evaluation and management  [name] 04/05/2021, 2:43 AM

## 2021-04-05 NOTE — Progress Notes (Signed)
NAME:  David Gates., MRN:  [id number], DOB:  1965/10/25, LOS: 21 ADMISSION DATE:  03/22/2021, CONSULTATION DATE:  04/08/2021 REFERRING MD:  TCTS CHIEF COMPLAINT: Code   History of Present Illness:  55 year old man with PMHx significant for HTN, T2DM, PAD, ESRD, cirrhosis (query cardiac etiology) who presented for symptomatic ischemic cardiomyopathy.    Patient underwent preoperative optimization with CHF, GI, and Nephrology teams with eventual CABG 5/31. On 6/1, patient developed respiratory distress followed by PEA arrest requiring CPR. Code was c/b increased bleeding around surgical sites, prompting mediastinal exploration 6/1. Intraoperative findings were notable for bleeding around mammary graft site and diffuse oozing for which patient received multiple blood products.   Patient intubated and on pressors postoperatively and PCCM was consulted for assistance with management.  Pertinent Medical History:  ESRD on HD Cardiac cirrhosis with portal hypertension IBS IDDM  Significant Hospital Events: Including procedures, antibiotic start and stop dates in addition to other pertinent events   5/23 Admitted 5/26 Paracentesis 4L 5/30 HD 5/31 CABG 5/31- 6/1 Code Blue, OR for mediastinal exploration 2/2 hemorrhagic shock induced by CPR. Factor 7, protamine, FFP, cryo given 6/1 PCCM consult, 4U PRBCs and 1U Plt . R pigtail chest tube placed due to limitation of vent weaning. 6/2 Extubated, chest tube 3.2L out past 24 hrs 6/3 Remains on 2L [place], chest tube with 500cc out/ 24 hrs 6/4 Remains on CRRT, 2L [place], milrinone, NE. 3 chest tubes remain. 6/6 Remains on CRRT, 4 chest tubes in place 6/9 Decreasing pressor requirements and CT output. CRRT transitioned to iHD. Echo repeated. Prior to HD session, patient suddenly became unresponsive with fixed and dilated pupils, minimal gag, breathing spontaneously. Code Stroke called. Reintubated, heparin d/c'ed, STAT CT Head/CTA. 6/10, WBC jump to 89 K,  Temp of 102,Remains on 40 of Levo, 40 of Neo, and Vaso at 0.03.Pupils are 6 and non-responsive to light. Trialysis cath inserted for CVVHD. R IJ is 2 days old, will get PICC and discontinue. Lactate of 2.8 6/12 coming down on pressors? Patient self extubated and reintubated 6/13 MRI?   Interim History / Subjective:   Remains intubated on mech vent: PRVC with set RR of 34 On levo 12, vaso 0.03, amio 30, Fentanyl 200 CRRT running Patient responding to stimuli; Left pupil 7 mm dilated and Right 5 mm; both non reactive to light. CT head and EEG negative; MRI pending. Patient self extubated yesterday and required reintubation.  Objective   Blood pressure (!) 105/54, pulse 97, temperature 99.2 F (37.3 C), temperature source Oral, resp. rate (!) 34, height '6\' 2"'  (1.88 m), weight 75.7 kg, SpO2 100 %. 4LNC    Vent Mode: PRVC FiO2 (%):  [40 %] 40 % Set Rate:  [34 bmp] 34 bmp Vt Set:  [570 mL] 570 mL PEEP:  [5 cmH20] 5 cmH20 Plateau Pressure:  [23 cmH20-27 cmH20] 25 cmH20   Intake/Output Summary (Last 24 hours) at 04/05/2021 0809 Last data filed at 04/05/2021 0800 Gross per 24 hour  Intake 3143.17 ml  Output 3782 ml  Net -638.83 ml    Filed Weights   03/31/21 0647 04/01/21 0500 04/02/21 0452  Weight: 75.8 kg 78.3 kg 75.7 kg   Physical Examination: General:  critically ill appearing patient HEENT: MM pink/moist; ET tube in place Neuro: responding to stimuli; Left pupil 7 mm dilated and Right 5 mm; both non reactive to light. No cough/gag reflex. CV: s1s2, RRR, no m/r/g PULM: Dim clear BS bilaterally; on mech vent PRVC with rate  set at 34 GI: soft, bsx4 active  Extremities: warm/dry, TED hose on BLE Skin: no rashes or lesions   Labs/imaging that I have personally reviewed: (right click and "Reselect all SmartList Selections" daily)    BC: Staph epidermidis and MR mecA/C (likely contaminated; other set of cultures pending)  K 4.1, mag 2.5 Creat 1.39 from 1.56 WBC 44.8 from  71.3 Hgb 8.3  CXR: ETT 6 cm above carina; L pleural effusion with atelectasis or infiltrate   Resolved Hospital Problem list     Assessment & Plan:   Acute encephalopathy prompting Code Stroke, work-up negative Patient became acutely unresponsive 6/9 with fixed and dilated pupils, minimal gag, breathing spontaneously. CT head and EEG negative Unclear what caused this episode.  Work-up as so far been unrevealing Plan: -Will check an MRI today -minimal sedation for frequent neuro exams -following some commands today  Acute hypoxemic respiratory failure Bilateral pleural effusions;  inferior R sided pigtail tube no longer in place, removed 6/10; surgical CT removed 6/7 Likely LLL HAP vs pleural effusion Plan: -continue mech ventilation 8 cc/kg; repeat ABG and wean RR accordingly -wean fio2 for O2 sat goal >90% -VAP prevention in place -repeat CXR in am: continue to monitor L pleural effusion and consider thoracentesis if not improving  New Leukocytosis 6/10: BC showing staph epi and Meth resistant mec A; likely contaminant; other BC pending Fever Plan: -continue cefepime, flagyl, and antifungal -continue to trend CBC/fever curve  Post-op mediastinal hemorrhage Cirrhotic and hemorrhage- induced consumptive coagulopathy Hemorrhagic shock, resolved Acute blood loss anemia (improving) Thrombocytopenia- (Improving): in setting of blood loss, coagulopathy, and cirrhosis Elevated Alk Phos/AST Plan: -trend CBC and transfuse for hgb <7  In hospital cardiac arrest precipitated by respiratory distress- PEA, minimal downtime. 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 VT- On 6/1 Plan: -HF following -continue Levo, Vaso for MAP >65 -continue amio and heparin   ESRD on HD PTA: known IJ stenosis and concern of R IJ stenosis; difficult access, RUE AVF Trialysis catheter placed 6/10 Plan: -Nephro  following -continue CVVHD -Trend BMP  DM2, controlled Plan: -SSI and Lantus -CBG monitoring   Best practice (right click and "Reselect all SmartList Selections" daily)  Diet:  NPO>> Tube feeds Pain/Anxiety/Delirium protocol (if indicated): Yes (RASS goal 0) VAP protocol (if indicated): Yes DVT prophylaxis: Systemic AC  GI prophylaxis: PPI Glucose control:  SSI Yes and Basal insulin Yes Central venous access:  Yes, and it is still needed Arterial line:  N/A  Foley:  N/A Mobility:  bed rest  PT consulted: Yes Last date of multidisciplinary goals of care discussion [pending] Code Status:  full code Disposition: per primary  This patient is critically ill with multiple organ system failure; which, requires frequent high complexity decision making, assessment, support, evaluation, and titration of therapies. This was completed through the application of advanced monitoring technologies and extensive interpretation of multiple databases. During this encounter critical care time was devoted to patient care services described in this note for 35 minutes.  JD Rexene Agent [facility] 04/05/2021, 12:44 PM  Please see [email or web address] for pager details.  From 7A-7P if no response, please call [phone]. After hours, please call ELink [phone].

## 2021-04-05 NOTE — Progress Notes (Signed)
[facility] for Infectious Disease   Reason for visit: Follow up on leukocytosis  Interval History: WBC down to 44.8, afebrile over > 48 hours.  No acute events.  Remains on vasopressin, levophed.  Yeast noted in respiratory culture.  Day 9 total antibiotics Day 9 cefepime Day 4 anidulafungin Day 4 metronidazole  Physical Exam: Constitutional:  Vitals:   04/05/21 1345 04/05/21 1400  BP: (!) 133/102 (!) 85/43  Pulse: 98 98  Resp: (!) 30 (!) 34  Temp:    SpO2: 100% 100%   patient appears in NAD Eyes: anicteric HENT: +ET Respiratory: respiratory effort on vent Cardiovascular: RRR GI: soft, nt, nd  Review of Systems: Unable to be assessed due to patient factors  Lab Results  Component Value Date   WBC 44.8 (H) 04/05/2021   HGB 9.5 (L) 04/05/2021   HCT 28.0 (L) 04/05/2021   MCV 91.3 04/05/2021   PLT 251 04/05/2021    Lab Results  Component Value Date   CREATININE 1.39 (H) 04/05/2021   BUN 15 04/05/2021   NA 138 04/05/2021   K 3.9 04/05/2021   CL 102 04/05/2021   CO2 23 04/05/2021    Lab Results  Component Value Date   ALT 24 04/04/2021   AST 40 04/04/2021   ALKPHOS 198 (H) 04/04/2021     Microbiology: Recent Results (from the past 240 hour(s))  MRSA PCR Screening     Status: None   Collection Time: 03/28/21  1:18 PM   Specimen: Nasopharyngeal  Result Value Ref Range Status   MRSA by PCR NEGATIVE NEGATIVE Final    Comment:        The GeneXpert MRSA Assay (FDA approved for NASAL specimens only), is one component of a comprehensive MRSA colonization surveillance program. It is not intended to diagnose MRSA infection nor to guide or monitor treatment for MRSA infections. Performed at [facility], [place] [address] [place], [place] [address]   Culture, Urine     Status: None   Collection Time: 04/02/21  8:17 AM   Specimen: Urine, Random  Result Value Ref Range Status   Specimen Description URINE, RANDOM  Final   Special Requests NONE   Final   Culture   Final    NO GROWTH Performed at [facility], [place] [address] [place], [place] [address]    Report Status 04/03/2021 FINAL  Final  Culture, blood (routine x 2)     Status: None (Preliminary result)   Collection Time: 04/02/21 11:44 AM   Specimen: BLOOD LEFT HAND  Result Value Ref Range Status   Specimen Description BLOOD LEFT HAND  Final   Special Requests   Final    BOTTLES DRAWN AEROBIC ONLY Blood Culture results may not be optimal due to an inadequate volume of blood received in culture bottles   Culture  Setup Time   Final    GRAM POSITIVE COCCI IN CLUSTERS AEROBIC BOTTLE ONLY CRITICAL RESULT CALLED TO, READ BACK BY AND VERIFIED WITH: PHARMD LAURA SEAY BY MESSAN H. AT 0225 ON 04/05/2021 Performed at [facility], [place] [address] [place], [place] [address]    Culture [facility] POSITIVE COCCI  Final   Report Status PENDING  Incomplete  Blood Culture ID Panel (Reflexed)     Status: Abnormal   Collection Time: 04/02/21 11:44 AM  Result Value Ref Range Status   Enterococcus faecalis NOT DETECTED NOT DETECTED Final   Enterococcus Faecium NOT DETECTED NOT DETECTED Final  Listeria monocytogenes NOT DETECTED NOT DETECTED Final   Staphylococcus species DETECTED (A) NOT DETECTED Final    Comment: CRITICAL RESULT CALLED TO, READ BACK BY AND VERIFIED WITH: PHARMD LAURA SEAY BY MESSAN H. AT 0225 ON 04/05/2021    Staphylococcus aureus (BCID) NOT DETECTED NOT DETECTED Final   Staphylococcus epidermidis DETECTED (A) NOT DETECTED Final    Comment: Methicillin (oxacillin) resistant coagulase negative staphylococcus. Possible blood culture contaminant (unless isolated from more than one blood culture draw or clinical case suggests pathogenicity). No antibiotic treatment is indicated for blood  culture contaminants. CRITICAL RESULT CALLED TO, READ BACK BY AND VERIFIED WITH: PHARMD LAURA SEAY BY MESSAN H. AT 0225 ON 04/05/2021    Staphylococcus lugdunensis NOT DETECTED NOT  DETECTED Final   Streptococcus species NOT DETECTED NOT DETECTED Final   Streptococcus agalactiae NOT DETECTED NOT DETECTED Final   Streptococcus pneumoniae NOT DETECTED NOT DETECTED Final   Streptococcus pyogenes NOT DETECTED NOT DETECTED Final   A.calcoaceticus-baumannii NOT DETECTED NOT DETECTED Final   Bacteroides fragilis NOT DETECTED NOT DETECTED Final   Enterobacterales NOT DETECTED NOT DETECTED Final   Enterobacter cloacae complex NOT DETECTED NOT DETECTED Final   Escherichia coli NOT DETECTED NOT DETECTED Final   Klebsiella aerogenes NOT DETECTED NOT DETECTED Final   Klebsiella oxytoca NOT DETECTED NOT DETECTED Final   Klebsiella pneumoniae NOT DETECTED NOT DETECTED Final   Proteus species NOT DETECTED NOT DETECTED Final   Salmonella species NOT DETECTED NOT DETECTED Final   Serratia marcescens NOT DETECTED NOT DETECTED Final   Haemophilus influenzae NOT DETECTED NOT DETECTED Final   Neisseria meningitidis NOT DETECTED NOT DETECTED Final   Pseudomonas aeruginosa NOT DETECTED NOT DETECTED Final   Stenotrophomonas maltophilia NOT DETECTED NOT DETECTED Final   Candida albicans NOT DETECTED NOT DETECTED Final   Candida auris NOT DETECTED NOT DETECTED Final   Candida glabrata NOT DETECTED NOT DETECTED Final   Candida krusei NOT DETECTED NOT DETECTED Final   Candida parapsilosis NOT DETECTED NOT DETECTED Final   Candida tropicalis NOT DETECTED NOT DETECTED Final   Cryptococcus neoformans/gattii NOT DETECTED NOT DETECTED Final   Methicillin resistance mecA/C DETECTED (A) NOT DETECTED Final    Comment: CRITICAL RESULT CALLED TO, READ BACK BY AND VERIFIED WITH: PHARMD LAURA SEAY BY MESSAN H. AT 0225 ON 04/05/2021 Performed at [facility], [address] [place], [place] [address]   Expectorated Sputum Assessment w Gram Stain, Rflx to Resp Cult     Status: None   Collection Time: 04/02/21  3:19 PM   Specimen: Expectorated Sputum  Result Value Ref Range Status   Specimen  Description EXPECTORATED SPUTUM  Final   Special Requests NONE  Final   Sputum evaluation   Final    THIS SPECIMEN IS ACCEPTABLE FOR SPUTUM CULTURE Performed at [facility], [place] [address] [place], [place] [address]    Report Status 04/03/2021 FINAL  Final  Culture, Respiratory w Gram Stain     Status: None   Collection Time: 04/02/21  3:19 PM  Result Value Ref Range Status   Specimen Description EXPECTORATED SPUTUM  Final   Special Requests NONE Reflexed from [id number]  Final   Gram Stain   Final    MODERATE WBC PRESENT,BOTH PMN AND MONONUCLEAR ABUNDANT YEAST    Culture   Final    ABUNDANT Normal respiratory flora-no Staph aureus or Pseudomonas seen Performed at [facility], [place] [address], [place], [place] [address]    Report Status  04/05/2021 FINAL  Final    Impression/Plan:  1. Leukocytosis - unclear etiology, with fever but could be reactive from underlying illness but overall improved.  No obvious signs of infectious source.  Will continue with cefepime and vancomycin for now.  Will stop anidulafungin, will stop metronidazole  2.  Shock - continues on pressor support for likely cardiogenic and possible septic shock with some improvement.  On antibiotics as above and will continue.    3.  ESRD - has been on IHD and now on CRRT and continuing per nephrology.

## 2021-04-05 NOTE — Progress Notes (Signed)
[facility] Progress Note   Assessment/ Plan:   Outpatient dialysis prescription: [facility] TTS [phone] 4 Hours, 180 dialyzer, 2K/2.5 Ca, DFR 500, BFR 400 Use AVF; graft on inner arm, 15 gauge Heparin loading dose 2000 and hourly dose of 1200 EDW 78.5 kg Last post weight 78 kg on 5/21 (went in at 78 and came out at 78 kg) Meds: epogen 2400 units each tx venofer 50 mg weekly Not on hectorol or calcitriol sensipar 30 mg three times a week with HD   Assessment/Plan:   Septic shock- on broad spectrum abx- Flagyl, cefepime, eraxis.  Pressors per PCCM Acute hypoxic RF: intubated per PCCM ESRD - normally on TTS dialysis in [place], last HD 5/30, now on CRRT since 03/25/2021 following CABG complicated by PEA arrest and need for pressors.   CRRT stopped 03/30/21 and temp HD catheter removed at that time. S/p left fem trialysis catheter placed 04/02/21 CRRT resumed 04/02/21 due to acute decompensation and profound hypotension. CRRT orders:  4K/2.5 Ca for all fluids, rate of prefilter 500 ml/hr, post-filter 300 ml/hr, dialysate 1500 ml/hr, UF rate 0-50 ml/hr, no heparin. CAD s/p CABG x 4 complicated by PEA arrest.  Currently stable Acute on chronic CHF with cardiogenic shock post CABG - on midodrine 15 mg tid Repeat ECHO EF 45-50%, RV function moderately reduced. Pulmonary HTN - per HF team Cirrhosis - presumably cardiogenic.  S/p paracentesis on 03/18/21. 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  Hypophosphatemia - repleted with IV phos and follow. Afib/ flutter: on systemic heparin gtt and amiodarone Dispo: ICU  Subjective:    Remains intubated on CRRT and pressors.     Objective:   BP (!) 105/52 (BP Location: Left Arm)   Pulse 98   Temp 99.2 F (37.3 C) (Oral)   Resp (!) 34   Ht '6\' 2"'  (1.88 m)   Wt 75.7 kg   SpO2 98%   BMI 21.43 kg/m   Physical Exam: Gen: ill appearing, cachectic, intubated and sedated CVS:  irregular, tachycardic Resp: coarse mechanical bilaterally Abd: soft Ext: sarcopenic, dependent thigh edema ACCESS: L nontunneled femoral HD catheter, R UE AVF + T/B  Labs: BMET Recent Labs  Lab 04/02/21 0344 04/02/21 0352 04/02/21 1347 04/02/21 1713 04/03/21 0021 04/03/21 1550 04/04/21 0123 04/04/21 1530 04/05/21 0401  NA 129*   < > 133* 132* 133* 134* 135 137 136  K 4.3   < > 4.4 4.5 4.1 4.7 4.4 4.1 4.1  CL 97*  --  98 98 101 101 102 104 102  CO2 16*  --  19* 21* 19* '22 22 24 23  ' GLUCOSE 102*  --  125* 132* 139* 97 111* 100* 149*  BUN 50*  --  45* 42* 37* 26* 22* 16 15  CREATININE 4.60*  --  4.17* 3.77* 3.18* 2.14* 1.92* 1.56* 1.39*  CALCIUM 7.4*  --  7.0* 7.2* 6.7* 7.4* 7.6* 7.9* 7.8*  PHOS 3.3  --  3.6 3.8 3.6 2.4*  2.5  --  1.8* 1.4*   < > = values in this interval not displayed.   CBC Recent Labs  Lab 04/01/21 1328 04/01/21 1526 04/02/21 1713 04/03/21 0021 04/04/21 0123 04/05/21 0401  WBC 28.1*   < > 88.4* 82.3* 71.3* 44.8*  NEUTROABS 21.1*  --   --  75.2* 64.6*  --   HGB 9.9*   < > 9.8* 8.8* 9.1* 8.3*  HCT 33.2*   < > 29.3* 26.2*  27.0* 25.1*  MCV 100.0   < > 91.8 91.3 89.1 91.3  PLT 326   < > 286 256 291 251   < > = values in this interval not displayed.      Medications:     amiodarone  200 mg Per Tube BID   aspirin  81 mg Per Tube Daily   atorvastatin  40 mg Per Tube Daily   bisacodyl  10 mg Oral Daily   Or   bisacodyl  10 mg Rectal Daily   chlorhexidine gluconate (MEDLINE KIT)  15 mL Mouth Rinse BID   Chlorhexidine Gluconate Cloth  6 each Topical Daily   darbepoetin (ARANESP) injection - NON-DIALYSIS  60 mcg Subcutaneous Q Thu-1800   docusate  200 mg Per Tube Daily   EPINEPHrine       feeding supplement (PROSource TF)  45 mL Per Tube QID   insulin aspart  0-24 Units Subcutaneous Q4H   insulin glargine  10 Units Subcutaneous Daily   ipratropium-albuterol  3 mL Nebulization QID   mouth rinse  15 mL Mouth Rinse 10 times per day    metoCLOPramide (REGLAN) injection  10 mg Intravenous Q8H   midodrine  15 mg Per Tube TID WC   multivitamin  1 tablet Per Tube QHS   pantoprazole sodium  40 mg Per Tube Daily   polyethylene glycol  17 g Per Tube Daily   sodium chloride flush  10-40 mL Intracatheter Q12H   sodium chloride flush  3 mL Intravenous Q12H   Thrombi-Pad  1 each Topical Once     Madelon Lips, MD 04/05/2021, 9:11 AM

## 2021-04-05 NOTE — Progress Notes (Signed)
ANTICOAGULATION CONSULT NOTE  Pharmacy Consult for heparin Indication: atrial flutter  Allergies  Allergen Reactions   Shellfish-Derived Products Anaphylaxis   Wasp Venom Protein Anaphylaxis   Penicillins Other (See Comments)    Childhood reaction    Patient Measurements: Height: '6\' 2"'$  (188 cm) Weight: 75.7 kg (166 lb 14.2 oz) IBW/kg (Calculated) : 82.2 Heparin Dosing Weight: 79kg  Vital Signs: BP: 105/54 (06/13 0700) Pulse Rate: 93 (06/13 0700)  Labs: Recent Labs    04/03/21 0021 04/03/21 0838 04/04/21 0123 04/04/21 0937 04/04/21 1530 04/04/21 1828 04/05/21 0401  HGB 8.8*  --  9.1*  --   --   --  8.3*  HCT 26.2*  --  27.0*  --   --   --  25.1*  PLT 256  --  291  --   --   --  251  HEPARINUNFRC  --    < > 0.11* 0.17*  --  0.11* 0.11*  CREATININE 3.18*   < > 1.92*  --  1.56*  --  1.39*   < > = values in this interval not displayed.     Estimated Creatinine Clearance: 64.3 mL/min (A) (by C-G formula based on SCr of 1.39 mg/dL (H)).   Medical History: Past Medical History:  Diagnosis Date   ESRD (end stage renal disease) ([place])    Hypertension    Peripheral arterial disease ([place])    Type 2 diabetes mellitus ([place])    Assessment: 85 YOF with recent CABG now with postop afib/flutter. HIT level was not actually sent though a result exists in chart, ok to disregard after discussion with team.   Heparin level difficult to obtain due to limited access. 6/11 Foot stick unsuccessful. RN will draw future levels from red CRRT port.   Heparin level still below goal at 0.11, on heparin infusion at 2500 units/hr. Hgb 8.3, plt 251. AT 23 - team aware. Plan for PEG/trach in future so will keep on heparin for now.   Goal of Therapy:  Heparin level 0.3-0.5  units/ml  Monitor platelets by anticoagulation protocol: Yes   Plan:  Increase IV heparin to 2600 units/hr Will get level with AM labs  Daily heparin level, CBC. Will monitor for bleeding.  Antonietta Jewel, PharmD,  [facility] Clinical Pharmacist  Phone: [phone] 04/05/2021 1:42 PM  Please check AMION for all [facility] phone numbers After 10:00 PM, call [name] [phone]

## 2021-04-06 ENCOUNTER — Inpatient Hospital Stay: Payer: Self-pay

## 2021-04-06 ENCOUNTER — Inpatient Hospital Stay (HOSPITAL_COMMUNITY): Payer: Medicare Other

## 2021-04-06 DIAGNOSIS — N185 Chronic kidney disease, stage 5: Secondary | ICD-10-CM

## 2021-04-06 LAB — RENAL FUNCTION PANEL
Albumin: 1.8 g/dL — ABNORMAL LOW (ref 3.5–5.0)
Albumin: 2 g/dL — ABNORMAL LOW (ref 3.5–5.0)
Anion gap: 8 (ref 5–15)
Anion gap: 11 (ref 5–15)
BUN: 12 mg/dL (ref 6–20)
BUN: 19 mg/dL (ref 6–20)
CO2: 22 mmol/L (ref 22–32)
CO2: 22 mmol/L (ref 22–32)
Calcium: 7 mg/dL — ABNORMAL LOW (ref 8.9–10.3)
Calcium: 7.9 mg/dL — ABNORMAL LOW (ref 8.9–10.3)
Chloride: 106 mmol/L (ref 98–111)
Chloride: 102 mmol/L (ref 98–111)
Creatinine, Ser: 1.25 mg/dL — ABNORMAL HIGH (ref 0.61–1.24)
Creatinine, Ser: 1.49 mg/dL — ABNORMAL HIGH (ref 0.61–1.24)
GFR, Estimated: >60 mL/min (ref >60)
GFR, Estimated: 55 mL/min — ABNORMAL LOW (ref >60)
Glucose, Bld: 126 mg/dL — ABNORMAL HIGH (ref 70–99)
Glucose, Bld: 163 mg/dL — ABNORMAL HIGH (ref 70–99)
Phosphorus: 2.2 mg/dL — ABNORMAL LOW (ref 2.5–4.6)
Phosphorus: 1.7 mg/dL — ABNORMAL LOW (ref 2.5–4.6)
Potassium: 3.8 mmol/L (ref 3.5–5.1)
Potassium: 3.6 mmol/L (ref 3.5–5.1)
Sodium: 136 mmol/L (ref 135–145)
Sodium: 135 mmol/L (ref 135–145)

## 2021-04-06 LAB — CBC
HCT: 23.6 % — ABNORMAL LOW (ref 39.0–52.0)
HCT: 30.6 % — ABNORMAL LOW (ref 39.0–52.0)
Hemoglobin: 7.6 g/dL — ABNORMAL LOW (ref 13.0–17.0)
Hemoglobin: 10.2 g/dL — ABNORMAL LOW (ref 13.0–17.0)
MCH: 30.2 pg (ref 26.0–34.0)
MCH: 30.8 pg (ref 26.0–34.0)
MCHC: 32.2 g/dL (ref 30.0–36.0)
MCHC: 33.3 g/dL (ref 30.0–36.0)
MCV: 93.7 fL (ref 80.0–100.0)
MCV: 92.4 fL (ref 80.0–100.0)
Platelets: 200 10*3/uL (ref 150–400)
Platelets: 216 10*3/uL (ref 150–400)
RBC: 2.52 MIL/uL — ABNORMAL LOW (ref 4.22–5.81)
RBC: 3.31 MIL/uL — ABNORMAL LOW (ref 4.22–5.81)
RDW: 21.8 % — ABNORMAL HIGH (ref 11.5–15.5)
RDW: 21.4 % — ABNORMAL HIGH (ref 11.5–15.5)
WBC: 37.6 10*3/uL — ABNORMAL HIGH (ref 4.0–10.5)
WBC: 29.4 10*3/uL — ABNORMAL HIGH (ref 4.0–10.5)
nRBC: 0.2 % (ref 0.0–0.2)
nRBC: 0.2 % (ref 0.0–0.2)

## 2021-04-06 LAB — GLUCOSE, CAPILLARY
Glucose-Capillary: 123 mg/dL — ABNORMAL HIGH (ref 70–99)
Glucose-Capillary: 127 mg/dL — ABNORMAL HIGH (ref 70–99)
Glucose-Capillary: 154 mg/dL — ABNORMAL HIGH (ref 70–99)
Glucose-Capillary: 185 mg/dL — ABNORMAL HIGH (ref 70–99)
Glucose-Capillary: 186 mg/dL — ABNORMAL HIGH (ref 70–99)
Glucose-Capillary: 186 mg/dL — ABNORMAL HIGH (ref 70–99)
Glucose-Capillary: 94 mg/dL (ref 70–99)

## 2021-04-06 LAB — HEPARIN LEVEL (UNFRACTIONATED)
Heparin Unfractionated: 0.89 IU/mL — ABNORMAL HIGH (ref 0.30–0.70)
Heparin Unfractionated: <0.1 IU/mL — ABNORMAL LOW (ref 0.30–0.70)

## 2021-04-06 LAB — CULTURE, BLOOD (ROUTINE X 2)

## 2021-04-06 LAB — PREPARE RBC (CROSSMATCH)

## 2021-04-06 LAB — MAGNESIUM: Magnesium: 2 mg/dL (ref 1.7–2.4)

## 2021-04-06 MED ORDER — SODIUM CHLORIDE 0.9% IV SOLUTION: Freq: Once | INTRAVENOUS | Status: DC

## 2021-04-06 MED ORDER — IPRATROPIUM-ALBUTEROL 0.5-2.5 (3) MG/3ML IN SOLN
3.0000 mL | Freq: Three times a day (TID) | RESPIRATORY_TRACT | Status: DC
  Administered 2021-04-06 – 2021-04-14 (×24): 3 mL via RESPIRATORY_TRACT
  Filled 2021-04-06 (×25): qty 3

## 2021-04-06 MED ORDER — SODIUM PHOSPHATES 45 MMOLE/15ML IV SOLN
20.0000 mmol | Freq: Once | INTRAVENOUS | Status: AC
  Administered 2021-04-06: 20 mmol via INTRAVENOUS
  Filled 2021-04-06: qty 6.67

## 2021-04-06 MED ORDER — SORBITOL 70 % SOLN
30.0000 mL | Freq: Once | Status: AC
  Administered 2021-04-06: 30 mL
  Filled 2021-04-06: qty 30

## 2021-04-06 MED ORDER — MIDAZOLAM HCL 2 MG/2ML IJ SOLN
2.0000 mg | INTRAMUSCULAR | Status: AC
  Administered 2021-04-06: 2 mg via INTRAVENOUS
  Filled 2021-04-06: qty 2

## 2021-04-06 NOTE — Progress Notes (Signed)
OT Cancellation Note  Patient Details Name: David Rose. MRN: [id number] DOB: 04/02/1966   Cancelled Treatment:    Reason Eval/Treat Not Completed: Other (comment)- acknowledged discontinued OT order.  Please re-consult as appropriate.   Jolaine Artist, OT Acute Rehabilitation Services Pager [phone] Office [phone]   [name] 04/06/2021, 10:02 AM

## 2021-04-06 NOTE — Progress Notes (Addendum)
Patient ID: David Daywalt., male   DOB: [birth year], 55 y.o.   MRN: [id number]      Advanced Heart Failure Rounding Note  PCP-Cardiologist: Carlyle Dolly, MD    Patient Profile   55 y/o male w/  history of ESRD due to DM and CHF with mid-range EF (LV EF 40-45% with moderately decreased RV systolic function and PASP 99 on 4/22 echo) as well as cirrhosis of uncertain etiology.  Based on low EF and elevated PA pressure, right and left heart cath was done in 5/22.  This showed severe 3VD, high cardiac output with moderate pulmonary hypertension but low PVR.  He is planned for CABG, but admitted pre-op for optimization.    Subjective:    CABG x 4 on 5/31 with LIMA-LAD, SVG-RCA, SVG-OM, SVG-D.   Coagulopathic post-op with multiple blood products.  Extubated post-op but developed respiratory distress => PEA arrest then VT. Reintubated.  ROSC with ACLS.  Returned to OR for mediastinal exploration 6/1.  Extubated 6/2.   6/6 Midodrine started.   6/9 AMS--> CT negative for acute bleed or ischemic CVA. Intubated. Suspected septic shock.  CTA chest showed no PE, moderate bilateral pleural effusions. Re-intubated and CVVH restarted. CT abdomen/pelvis: anasarca, bilateral pleural effusions and lung base opacities, thickened colon/terminal ileum (?due to cirrhosis, ?ileocolitis).   Remains intubated/sedated. On CVVHD, current UF net goal 50-100 cc/hr.  5.8L removed yesterday. Wt down 6 lb.   On Norepi 11 + vaso 0.03 units.   Finished  Flagyl and Eraxis. Remains on Vancomycin/cefepime for HCAP/septic shock.  WBCs 88 => 82 => 71 => 45=>37K.  AF. ID following.   On heparin gtt and amiodarone for AFL. HR controlled 80s-90s   Hgb down 9.5>>7.6.   Echo (6/9) with EF 45-50%, inferolateral severe hypokinesis, moderate RV dysfunction.    Objective:   Weight Range: 72.6 kg Body mass index is 20.55 kg/m.   Vital Signs:   Temp:  [97.7 F (36.5 C)-99.8 F (37.7 C)] 98.6 F (37 C) (06/14  0720) Pulse Rate:  [87-99] 94 (06/14 0700) Resp:  [19-34] 28 (06/14 0700) BP: (79-133)/(39-102) 106/56 (06/14 0700) SpO2:  [95 %-100 %] 100 % (06/14 0700) FiO2 (%):  [40 %-60 %] 40 % (06/14 0400) Weight:  [72.6 kg] 72.6 kg (06/14 0442) Last BM Date: 03/31/21  Weight change: Filed Weights   04/01/21 0500 04/02/21 0452 04/06/21 0442  Weight: 78.3 kg 75.7 kg 72.6 kg    Intake/Output:   Intake/Output Summary (Last 24 hours) at 04/06/2021 0731 Last data filed at 04/06/2021 0700 Gross per 24 hour  Intake 4041.83 ml  Output 5793 ml  Net -1751.17 ml      Physical Exam   PHYSICAL EXAM: General:  thin WM, intubated.  HEENT: +ETT  Neck: supple. JVD 8 cm. Carotids 2+ bilat; no bruits. No lymphadenopathy or thyromegaly appreciated. Cor: PMI nondisplaced. Regular rate & rhythm. No rubs, gallops or murmurs. Lungs: intubated and clear  Abdomen: soft, nontender, nondistended. No hepatosplenomegaly. No bruits or masses. Good bowel sounds. Extremities: no cyanosis, clubbing, rash, edema + bilateral SCDs  Neuro: alert & oriented x 3, cranial nerves grossly intact. moves all 4 extremities w/o difficulty. Affect pleasant.   Telemetry   ? Sinus vs Atypical flutter 90s.   Labs    CBC Recent Labs    04/04/21 0123 04/05/21 0401 04/05/21 1107 04/06/21 0055  WBC 71.3* 44.8*  --  37.6*  NEUTROABS 64.6*  --   --   --  HGB 9.1* 8.3* 9.5* 7.6*  HCT 27.0* 25.1* 28.0* 23.6*  MCV 89.1 91.3  --  93.7  PLT 291 251  --  449   Basic Metabolic Panel Recent Labs    04/05/21 0401 04/05/21 1107 04/05/21 1536 04/06/21 0055  NA 136   < > 136 136  K 4.1   < > 4.0 3.8  CL 102  --  101 106  CO2 23  --  26 22  GLUCOSE 149*  --  134* 126*  BUN 15  --  13 12  CREATININE 1.39*  --  1.25* 1.25*  CALCIUM 7.8*  --  8.0* 7.0*  MG 2.5*  --   --  2.0  PHOS 1.4*  --  1.1* 2.2*   < > = values in this interval not displayed.   Liver Function Tests Recent Labs    04/04/21 0123 04/04/21 1530  04/05/21 1536 04/06/21 0055  AST 40  --   --   --   ALT 24  --   --   --   ALKPHOS 198*  --   --   --   BILITOT 2.1*  --   --   --   PROT 5.7*  --   --   --   ALBUMIN 2.1*   < > 1.9* 1.8*   < > = values in this interval not displayed.   No results for input(s): LIPASE, AMYLASE in the last 72 hours. Cardiac Enzymes No results for input(s): CKTOTAL, CKMB, CKMBINDEX, TROPONINI in the last 72 hours.  BNP: BNP (last 3 results) Recent Labs    02/23/2021 1545  BNP 1,127.8*    ProBNP (last 3 results) No results for input(s): PROBNP in the last 8760 hours.   D-Dimer No results for input(s): DDIMER in the last 72 hours.  Hemoglobin A1C No results for input(s): HGBA1C in the last 72 hours. Fasting Lipid Panel No results for input(s): CHOL, HDL, LDLCALC, TRIG, CHOLHDL, LDLDIRECT in the last 72 hours. Thyroid Function Tests No results for input(s): TSH, T4TOTAL, T3FREE, THYROIDAB in the last 72 hours.  Invalid input(s): FREET3  Other results:   Imaging    No results found.    Medications:     Scheduled Medications:  amiodarone  200 mg Per Tube BID   aspirin  81 mg Per Tube Daily   atorvastatin  40 mg Per Tube Daily   bisacodyl  10 mg Oral Daily   Or   bisacodyl  10 mg Rectal Daily   chlorhexidine gluconate (MEDLINE KIT)  15 mL Mouth Rinse BID   Chlorhexidine Gluconate Cloth  6 each Topical Daily   darbepoetin (ARANESP) injection - NON-DIALYSIS  60 mcg Subcutaneous Q Thu-1800   docusate  200 mg Per Tube Daily   feeding supplement (PROSource TF)  45 mL Per Tube QID   insulin aspart  0-24 Units Subcutaneous Q4H   insulin glargine  10 Units Subcutaneous Daily   ipratropium-albuterol  3 mL Nebulization QID   mouth rinse  15 mL Mouth Rinse 10 times per day   midodrine  15 mg Per Tube TID WC   multivitamin  1 tablet Per Tube QHS   pantoprazole sodium  40 mg Per Tube Daily   polyethylene glycol  17 g Per Tube Daily   sodium chloride flush  10-40 mL Intracatheter Q12H    sodium chloride flush  3 mL Intravenous Q12H   Thrombi-Pad  1 each Topical Once    Infusions:  prismasol BGK 4/2.5 500 mL/hr at 04/06/21 0637    prismasol BGK 4/2.5 300 mL/hr at 04/05/21 1619   sodium chloride     sodium chloride Stopped (04/02/21 2123)   ceFEPime (MAXIPIME) IV Stopped (04/05/21 2150)   feeding supplement (VITAL 1.5 CAL) 1,000 mL (04/05/21 0800)   fentaNYL infusion INTRAVENOUS 175 mcg/hr (04/06/21 0700)   heparin 2,600 Units/hr (04/06/21 0700)   lactated ringers     lactated ringers Stopped (04/01/21 1902)   norepinephrine (LEVOPHED) Adult infusion 11 mcg/min (04/06/21 0700)   prismasol BGK 4/2.5 1,500 mL/hr at 04/06/21 0615   vasopressin 0.03 Units/min (04/06/21 0700)    PRN Medications: heparin, lactated ringers, lidocaine (PF), lip balm, metoprolol tartrate, morphine injection, ondansetron (ZOFRAN) IV, oxyCODONE, sodium chloride, sodium chloride flush, sodium chloride flush     Assessment/Plan   1. CAD: Severe 3VD with decreased EF.  I reviewed films with Dr. Ellyn Hack, PCI would be possible but would be difficult/high risk with multiple lesions and heavy calcification. 5/31 LIMA-LAD, SVG-RCA, SVG-OM, SVG-D. No s/s ischemic currently  - ASA 81 + atorvastatin 40 mg daily.  2. Acute/chronic HF with mid range EF => cardiogenic shock post-CABG: Suspect ischemic cardiomyopathy.  Echo 4/22 with EF 40-45% with moderately decreased RV systolic function and PASP 99, moderate TR. There was a prominent component of RV failure with RA pressure elevated out of proportion to PCWP (CVP/PCWP 0.875 on RHC) but PAPI adequate at 2.8. Post-op had PEA arrest then VT.  On 6/9, episode of altered mental status, progressive hypotension, and intubated.  Suspect septic shock now.  Echo (6/9) with EF 45-50%, inferolateral severe hypokinesis, moderate RV dysfunction. On NE 11 mcg + vasopressin 0.03 units.  Weight coming down, getting close to baseline.  - CVVH ongoing, aim to keep net even  today - Continue to wean pressors as able.  - Continue midodrine 3. ESRD: Suspected due to diabetes.  Volume up post-op with multiple blood products. Now back to baseline.  - CVVH ongoing, will aim to keep net even   - Renal following  4. 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  The CO was not markedly high.  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 Oxygen saturation was 99% on RA pre-op, so no evidence for hepatopulmonary syndrome.   - Sildenafil has been on hold with pressors.  5. Cirrhosis: Noted on abdominal US from 2020.  H/o paracentesis.  Had workup at [facility] (though patient does not remember this), viral hepatitis labs negative.  They ended up think that the cirrhosis was cardiogenic.  Repeat abdominal US 5/23 w/ nodular hepatic parenchymal pattern with increased echogenicity consistent with the patient's known cirrhosis. No focal hepatic abnormality identified. Portal vein is patent. Moderate ascites. GI has seen, patient had 4L paracentesis on 5/26. - He may eventually need repeat paracentesis.  6. Coagulopathy: Post-op bleeding in setting of coagulopathy post-CABG in patient with cirrhosis.  Currently seems controlled.  Platelets dropped then recovered, doubt HIT.  7. VT: Had after PEA arrest am 6/1.  - Continue amiodarone 200 bid per tube.  8. Anemia: ABLA.  7.6.  - transfuse 1 u PRBCs 9. Pleural effusions: ?hepatic hydrothorax.    - Left effusion on last CXR, ID recommend thoracentesis.     10. PEA arrest: Respiratory arrest post-extubation.  ROSC with CPR.  11. Acute hypoxemic respiratory failure:  Extubated 6/2, re-intubated 6/9. Self-extubated overnight 6/12 and re-intubated.  - Stable on  vent, per CCM.  12. Atrial fibrillation: Atypical atrial flutter currently.   - Continue amiodarone per tube.  - On heparin gtt - Eventual DCCV.  13. ID:  Cefepime/Flagyl/Eraxis.   Suspected HCAP with septic shock.  WBC 88=>85=>41. Now afebrile. Cultures with Staph epidermidis 1/2, suspect contaminant.  - ID following, continue broad spectrum abx. Finished Eraxis 14. Neuro: Altered mental status 6/9 with intubation.  CT head with no bleed or ischemic CVA seen.  EEG with diffuse encephalopathy.  RNs report he will wake up and intermittently follow commands.   Lyda Jester, PA-C  04/06/2021 7:31 AM  Patient seen with PA, agree with the above note.  He remains on NE 11, vasopressin 0.03.  CVVH pulling 50-100 cc/hr net negative, weight down 6 lbs.    He will wake up and follow commands.    Remains on vancomycin/cefepime for HCAP.   General: NAD Neck: JVP 8 cm, no thyromegaly or thyroid nodule.  Lungs: Decreased at bases.  CV: Nondisplaced PMI.  Heart regular S1/S2, no S3/S4, no murmur.  No peripheral edema.   Abdomen: Soft, nontender, no hepatosplenomegaly, no distention.  Skin: Intact without lesions or rashes.  Neurologic: Follows commands.  Extremities: No clubbing or cyanosis.  HEENT: Normal.   Volume status looks to be near baseline.  Think we can aim for CVVH net 0-50 cc/hr UF.    Wean down NE and vasopressin as able, continue midodrine.  Hopefully can cut back on pressors as we slow CVVH pull.   Probably still in flutter, will get ECG.   Looks like he will need trach/PEG, timing per surgery.   CRITICAL CARE Performed by: Loralie Champagne  Total critical care time: 35 minutes  Critical care time was exclusive of separately billable procedures and treating other patients.  Critical care was necessary to treat or prevent imminent or life-threatening deterioration.  Critical care was time spent personally by me on the following activities: development of treatment plan with patient and/or surrogate as well as nursing, discussions with consultants, evaluation of patient's response to treatment, examination of patient,  obtaining history from patient or surrogate, ordering and performing treatments and interventions, ordering and review of laboratory studies, ordering and review of radiographic studies, pulse oximetry and re-evaluation of patient's condition.  Loralie Champagne 04/06/2021 8:56 AM

## 2021-04-06 NOTE — Progress Notes (Signed)
PT Cancellation Note  Patient Details Name: David Rose. MRN: [id number] DOB: 06-30-1966   Cancelled Treatment:    Reason Eval/Treat Not Completed: Patient not medically ready (pt remains intubated with fem access CRRT and not currently appropriate for mobility. will sign off and await new order)   Reford Olliff B Umberto Pavek 04/06/2021, 6:41 AM Bayard Males, PT Acute Rehabilitation Services Pager: [phone] Office: [phone]

## 2021-04-06 NOTE — Progress Notes (Signed)
[facility] Progress Note   Assessment/ Plan:   Outpatient dialysis prescription: [facility] [phone] 4 Hours, 180 dialyzer, 2K/2.5 Ca, DFR 500, BFR 400 Use AVF; graft on inner arm, 15 gauge Heparin loading dose 2000 and hourly dose of 1200 EDW 78.5 kg Last post weight 78 kg on 5/21 (went in at 78 and came out at 78 kg) Meds: epogen 2400 units each tx venofer 50 mg weekly Not on hectorol or calcitriol sensipar 30 mg three times a week with HD   Assessment/Plan:   Septic shock- on broad spectrum abx- Flagyl, cefepime.  Eraxis and flagyl stopped 04/05/21.  ID following.  Pressors per PCCM Acute hypoxic RF: intubated per PCCM ESRD - normally on TTS dialysis in [place], last  outpatient HD 5/30 now on CRRT since 04/18/2021 following CABG complicated by PEA arrest and need for pressors.   - CRRT 04/08/2021-03/30/21 and temp HD catheter removed at that time.       - CRRT resumed 04/02/21 due to acute decompensation and profound hypotension, has L femoral temporary HD cath  CAD s/p CABG x 4 complicated by PEA arrest.  Acute on chronic CHF with cardiogenic shock post CABG - on midodrine 15 mg tid.  Repeat ECHO EF 45-50%, RV function moderately reduced. Pulmonary HTN - per HF team Cirrhosis - presumably cardiogenic.  S/p paracentesis on 03/18/21. 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  Hypophosphatemia - repleted with IV phos and follow. Afib/ flutter: on systemic heparin gtt and amiodarone Dispo: ICU  Subjective:    Eraxis and Flagyl stopped per ID yesterday.  Overall little change.  Net negative 1.8L    Objective:   BP (!) 100/48 (BP Location: Left Arm)   Pulse 98   Temp 98.6 F (37 C) (Oral)   Resp (!) 28   Ht '6\' 2"'  (1.88 m)   Wt 72.6 kg   SpO2 99%   BMI 20.55 kg/m   Physical Exam: Gen: ill appearing, cachectic, intubated and sedated CVS: irregular, tachycardic Resp: coarse mechanical bilaterally Abd: soft Ext:  sarcopenic, dependent thigh edema ACCESS: L nontunneled femoral HD catheter, R UE AVF + T/B  Labs: BMET Recent Labs  Lab 04/02/21 1713 04/03/21 0021 04/03/21 1550 04/04/21 0123 04/04/21 1530 04/05/21 0401 04/05/21 1107 04/05/21 1536 04/06/21 0055  NA 132* 133* 134* 135 137 136 138 136 136  K 4.5 4.1 4.7 4.4 4.1 4.1 3.9 4.0 3.8  CL 98 101 101 102 104 102  --  101 106  CO2 21* 19* '22 22 24 23  ' --  26 22  GLUCOSE 132* 139* 97 111* 100* 149*  --  134* 126*  BUN 42* 37* 26* 22* 16 15  --  13 12  CREATININE 3.77* 3.18* 2.14* 1.92* 1.56* 1.39*  --  1.25* 1.25*  CALCIUM 7.2* 6.7* 7.4* 7.6* 7.9* 7.8*  --  8.0* 7.0*  PHOS 3.8 3.6 2.4*  2.5  --  1.8* 1.4*  --  1.1* 2.2*   CBC Recent Labs  Lab 04/01/21 1328 04/01/21 1526 04/03/21 0021 04/04/21 0123 04/05/21 0401 04/05/21 1107 04/06/21 0055  WBC 28.1*   < > 82.3* 71.3* 44.8*  --  37.6*  NEUTROABS 21.1*  --  75.2* 64.6*  --   --   --   HGB 9.9*   < > 8.8* 9.1* 8.3* 9.5* 7.6*  HCT 33.2*   < > 26.2* 27.0* 25.1* 28.0* 23.6*  MCV 100.0   < >  91.3 89.1 91.3  --  93.7  PLT 326   < > 256 291 251  --  200   < > = values in this interval not displayed.      Medications:     amiodarone  200 mg Per Tube BID   aspirin  81 mg Per Tube Daily   atorvastatin  40 mg Per Tube Daily   bisacodyl  10 mg Oral Daily   Or   bisacodyl  10 mg Rectal Daily   chlorhexidine gluconate (MEDLINE KIT)  15 mL Mouth Rinse BID   Chlorhexidine Gluconate Cloth  6 each Topical Daily   darbepoetin (ARANESP) injection - NON-DIALYSIS  60 mcg Subcutaneous Q Thu-1800   docusate  200 mg Per Tube Daily   feeding supplement (PROSource TF)  45 mL Per Tube QID   insulin aspart  0-24 Units Subcutaneous Q4H   insulin glargine  10 Units Subcutaneous Daily   ipratropium-albuterol  3 mL Nebulization QID   mouth rinse  15 mL Mouth Rinse 10 times per day   midodrine  15 mg Per Tube TID WC   multivitamin  1 tablet Per Tube QHS   pantoprazole sodium  40 mg Per Tube Daily    polyethylene glycol  17 g Per Tube Daily   sodium chloride flush  10-40 mL Intracatheter Q12H   sodium chloride flush  3 mL Intravenous Q12H   sorbitol  30 mL Per Tube Once   Thrombi-Pad  1 each Topical Once     Madelon Lips, MD 04/06/2021, 9:51 AM

## 2021-04-06 NOTE — Progress Notes (Signed)
ANTICOAGULATION CONSULT NOTE  Pharmacy Consult for heparin Indication: atrial flutter  Allergies  Allergen Reactions   Shellfish-Derived Products Anaphylaxis   Wasp Venom Protein Anaphylaxis   Penicillins Other (See Comments)    Childhood reaction    Patient Measurements: Height: '6\' 2"'$  (188 cm) Weight: 72.6 kg (160 lb 0.9 oz) IBW/kg (Calculated) : 82.2 Heparin Dosing Weight: 79kg  Vital Signs: Temp: 98.6 F (37 C) (06/14 0720) Temp Source: Oral (06/14 0720) BP: 106/56 (06/14 0700) Pulse Rate: 94 (06/14 0700)  Labs: Recent Labs    04/04/21 0123 04/04/21 0937 04/05/21 0401 04/05/21 1107 04/05/21 1536 04/06/21 0055 04/06/21 0315  HGB 9.1*  --  8.3* 9.5*  --  7.6*  --   HCT 27.0*  --  25.1* 28.0*  --  23.6*  --   PLT 291  --  251  --   --  200  --   HEPARINUNFRC 0.11*   < > 0.11*  --   --  0.89* <0.10*  CREATININE 1.92*   < > 1.39*  --  1.25* 1.25*  --    < > = values in this interval not displayed.     Estimated Creatinine Clearance: 68.6 mL/min (A) (by C-G formula based on SCr of 1.25 mg/dL (H)).   Medical History: Past Medical History:  Diagnosis Date   ESRD (end stage renal disease) ([place])    Hypertension    Peripheral arterial disease ([place])    Type 2 diabetes mellitus ([place])    Assessment: David Rose with recent CABG now with postop afib/flutter. HIT level was not actually sent though a result exists in chart, ok to disregard after discussion with team.   Heparin level difficult to obtain due to limited access. 6/11 Foot stick unsuccessful. RN will draw future levels from red CRRT port.   Heparin level still below goal (<0.1), on heparin infusion at 2600 units/hr. Hgb 7.6, plt 200. AT 23 on 6/13 - team aware. Plan for PEG/trach in future pending MRI results so will keep on heparin for now. Continued having issues with lab draws - discussed with team, will plan for PICC placement so will run heparin peripheral and draw central once established. Given Hgb drifted  down and unclear on recent levels, will continue at same rate.   Goal of Therapy:  Heparin level 0.3-0.5  units/ml  Monitor platelets by anticoagulation protocol: Yes   Plan:  Continue heparin infusion at 2600 units/hr Will get level with AM labs  Daily heparin level, CBC. Will monitor for bleeding.  Antonietta Jewel, PharmD, [place] Clinical Pharmacist  Phone: [phone] 04/06/2021 7:39 AM  Please check AMION for all [place] phone numbers After 10:00 PM, call [place] [phone]

## 2021-04-06 NOTE — Progress Notes (Signed)
CSW attempted to visit the patient at bedside to introduce self as the heart failure social worker and to complete a very brief SDOH screening with the patient to address social needs as needed however the patient was unresponsive and unable to engage in conversation at this time.   CSW will continue to follow for discharge needs and will check back with the patient at another time.  Daquawn Seelman, MSW, LCSWA [phone] Heart Failure Social Worker  

## 2021-04-06 NOTE — Progress Notes (Signed)
[facility]   Reason for visit: follow up on leukocytosis  Interval History: WBC down to 37.6.  remains afebrile.  No acute events.  CXR with slight increase in patchy infiltrate of the left lung Day 10 total antibiotics Day 10 cefepime  Physical Exam: Constitutional:  Vitals:   04/06/21 1002 04/06/21 1117  BP: 126/72 (!) 109/48  Pulse: 99 98  Resp: 20 17  Temp:  98.7 F (37.1 C)  SpO2: 100% 100%   He appears in nad Eyes: eyes open HENT: +ET Respiratory: respiratory effort on vent Cardiovascular: RRR   Review of Systems: Unable to be assessed due to patient factors  Lab Results  Component Value Date   WBC 37.6 (H) 04/06/2021   HGB 7.6 (L) 04/06/2021   HCT 23.6 (L) 04/06/2021   MCV 93.7 04/06/2021   PLT 200 04/06/2021    Lab Results  Component Value Date   CREATININE 1.25 (H) 04/06/2021   BUN 12 04/06/2021   NA 136 04/06/2021   K 3.8 04/06/2021   CL 106 04/06/2021   CO2 22 04/06/2021    Lab Results  Component Value Date   ALT 24 04/04/2021   AST 40 04/04/2021   ALKPHOS 198 (H) 04/04/2021     Microbiology: Recent Results (from the past 240 hour(s))  MRSA PCR Screening     Status: None   Collection Time: 03/28/21  1:18 PM   Specimen: Nasopharyngeal  Result Value Ref Range Status   MRSA by PCR NEGATIVE NEGATIVE Final    Comment:        The GeneXpert MRSA Assay (FDA approved for NASAL specimens only), is one component of a comprehensive MRSA colonization surveillance program. It is not intended to diagnose MRSA infection nor to guide or monitor treatment for MRSA infections. Performed at [facility], [place] [address], [place], [place] [address]   Culture, Urine     Status: None   Collection Time: 04/02/21  8:17 AM   Specimen: Urine, Random  Result Value Ref Range Status   Specimen Description URINE, RANDOM  Final   Special Requests NONE  Final   Culture   Final    NO GROWTH Performed at [facility], [place] [address] [place], [place] [address]    Report Status 04/03/2021 FINAL  Final  Culture, blood (routine x 2)     Status: Abnormal   Collection Time: 04/02/21 11:44 AM   Specimen: BLOOD LEFT HAND  Result Value Ref Range Status   Specimen Description BLOOD LEFT HAND  Final   Special Requests   Final    BOTTLES DRAWN AEROBIC ONLY Blood Culture results may not be optimal due to an inadequate volume of blood received in culture bottles   Culture  Setup Time   Final    GRAM POSITIVE COCCI IN CLUSTERS AEROBIC BOTTLE ONLY CRITICAL RESULT CALLED TO, READ BACK BY AND VERIFIED WITH: PHARMD LAURA SEAY BY MESSAN H. AT 0225 ON 04/05/2021    Culture (A)  Final    STAPHYLOCOCCUS EPIDERMIDIS THE SIGNIFICANCE OF ISOLATING THIS ORGANISM FROM A SINGLE VENIPUNCTURE CANNOT BE PREDICTED WITHOUT FURTHER CLINICAL AND CULTURE CORRELATION. SUSCEPTIBILITIES AVAILABLE ONLY ON REQUEST. Performed at [facility], [place] [address] [place], [place] [address]    Report Status 04/06/2021 FINAL  Final  Blood Culture ID Panel (Reflexed)     Status: Abnormal   Collection Time: 04/02/21 11:44 AM  Result Value Ref Range Status  Enterococcus faecalis NOT DETECTED NOT DETECTED Final   Enterococcus Faecium NOT DETECTED NOT DETECTED Final   Listeria monocytogenes NOT DETECTED NOT DETECTED Final   Staphylococcus species DETECTED (A) NOT DETECTED Final    Comment: CRITICAL RESULT CALLED TO, READ BACK BY AND VERIFIED WITH: PHARMD LAURA SEAY BY MESSAN H. AT 0225 ON 04/05/2021    Staphylococcus aureus (BCID) NOT DETECTED NOT DETECTED Final   Staphylococcus epidermidis DETECTED (A) NOT DETECTED Final    Comment: Methicillin (oxacillin) resistant coagulase negative staphylococcus. Possible blood culture contaminant (unless isolated from more than one blood culture draw or clinical case suggests pathogenicity). No antibiotic treatment is indicated for blood  culture contaminants. CRITICAL RESULT CALLED TO, READ BACK BY AND  VERIFIED WITH: PHARMD LAURA SEAY BY MESSAN H. AT 0225 ON 04/05/2021    Staphylococcus lugdunensis NOT DETECTED NOT DETECTED Final   Streptococcus species NOT DETECTED NOT DETECTED Final   Streptococcus agalactiae NOT DETECTED NOT DETECTED Final   Streptococcus pneumoniae NOT DETECTED NOT DETECTED Final   Streptococcus pyogenes NOT DETECTED NOT DETECTED Final   A.calcoaceticus-baumannii NOT DETECTED NOT DETECTED Final   Bacteroides fragilis NOT DETECTED NOT DETECTED Final   Enterobacterales NOT DETECTED NOT DETECTED Final   Enterobacter cloacae complex NOT DETECTED NOT DETECTED Final   Escherichia coli NOT DETECTED NOT DETECTED Final   Klebsiella aerogenes NOT DETECTED NOT DETECTED Final   Klebsiella oxytoca NOT DETECTED NOT DETECTED Final   Klebsiella pneumoniae NOT DETECTED NOT DETECTED Final   Proteus species NOT DETECTED NOT DETECTED Final   Salmonella species NOT DETECTED NOT DETECTED Final   Serratia marcescens NOT DETECTED NOT DETECTED Final   Haemophilus influenzae NOT DETECTED NOT DETECTED Final   Neisseria meningitidis NOT DETECTED NOT DETECTED Final   Pseudomonas aeruginosa NOT DETECTED NOT DETECTED Final   Stenotrophomonas maltophilia NOT DETECTED NOT DETECTED Final   Candida albicans NOT DETECTED NOT DETECTED Final   Candida auris NOT DETECTED NOT DETECTED Final   Candida glabrata NOT DETECTED NOT DETECTED Final   Candida krusei NOT DETECTED NOT DETECTED Final   Candida parapsilosis NOT DETECTED NOT DETECTED Final   Candida tropicalis NOT DETECTED NOT DETECTED Final   Cryptococcus neoformans/gattii NOT DETECTED NOT DETECTED Final   Methicillin resistance mecA/C DETECTED (A) NOT DETECTED Final    Comment: CRITICAL RESULT CALLED TO, READ BACK BY AND VERIFIED WITH: PHARMD LAURA SEAY BY MESSAN H. AT 0225 ON 04/05/2021 Performed at [facility], [address] [place], [place] [address]   Expectorated Sputum Assessment w Gram Stain, Rflx to Resp Cult     Status: None    Collection Time: 04/02/21  3:19 PM   Specimen: Expectorated Sputum  Result Value Ref Range Status   Specimen Description EXPECTORATED SPUTUM  Final   Special Requests NONE  Final   Sputum evaluation   Final    THIS SPECIMEN IS ACCEPTABLE FOR SPUTUM CULTURE Performed at [facility], [place] [address] [place], [place] [address]    Report Status 04/03/2021 FINAL  Final  Culture, Respiratory w Gram Stain     Status: None   Collection Time: 04/02/21  3:19 PM  Result Value Ref Range Status   Specimen Description EXPECTORATED SPUTUM  Final   Special Requests NONE Reflexed from [id number]  Final   Gram Stain   Final    MODERATE WBC PRESENT,BOTH PMN AND MONONUCLEAR ABUNDANT YEAST    Culture   Final    ABUNDANT Normal respiratory flora-no Staph aureus or Pseudomonas seen  Performed at [facility], [place] [address] [place], [place] [address]    Report Status 04/05/2021 FINAL  Final    Impression/Plan:  1. Leukocytosis - no clear etiology except for infiltrate on his CXR which likely is from edema.  The leukocytosis continues to trend down.  Sputum culture only with yeast and I would not consider yeast significant.   Will continue to monitor and continue cefepime for now  2.  Shock - weaning some pressor support.  Cardiogenic in nature.  Followed by CCM.   3.  ESRD - continues on CVVH.    4.  Cirrhosis - known cirrhosis and most c/w cardiogenic origin.

## 2021-04-06 NOTE — Progress Notes (Signed)
NAME:  Toren Tucholski., MRN:  [id number], DOB:  06-08-1966, LOS: 62 ADMISSION DATE:  02/27/2021, CONSULTATION DATE:  04/06/2021 REFERRING MD:  TCTS CHIEF COMPLAINT: Code   History of Present Illness:  55 year old man with PMHx significant for HTN, T2DM, PAD, ESRD, cirrhosis (query cardiac etiology) who presented for symptomatic ischemic cardiomyopathy.    Patient underwent preoperative optimization with CHF, GI, and Nephrology teams with eventual CABG 5/31. On 6/1, patient developed respiratory distress followed by PEA arrest requiring CPR. Code was c/b increased bleeding around surgical sites, prompting mediastinal exploration 6/1. Intraoperative findings were notable for bleeding around mammary graft site and diffuse oozing for which patient received multiple blood products.   Patient intubated and on pressors postoperatively and PCCM was consulted for assistance with management.  Pertinent Medical History:  ESRD on HD Cardiac cirrhosis with portal hypertension IBS IDDM  Significant Hospital Events: Including procedures, antibiotic start and stop dates in addition to other pertinent events   5/23 Admitted 5/26 Paracentesis 4L 5/30 HD 5/31 CABG 5/31- 6/1 Code Blue, OR for mediastinal exploration 2/2 hemorrhagic shock induced by CPR. Factor 7, protamine, FFP, cryo given 6/1 PCCM consult, 4U PRBCs and 1U Plt . R pigtail chest tube placed due to limitation of vent weaning. 6/2 Extubated, chest tube 3.2L out past 24 hrs 6/3 Remains on 2L [place], chest tube with 500cc out/ 24 hrs 6/4 Remains on CRRT, 2L , milrinone, NE. 3 chest tubes remain. 6/6 Remains on CRRT, 4 chest tubes in place 6/9 Decreasing pressor requirements and CT output. CRRT transitioned to iHD. Echo repeated. Prior to HD session, patient suddenly became unresponsive with fixed and dilated pupils, minimal gag, breathing spontaneously. Code Stroke called. Reintubated, heparin d/c'ed, STAT CT Head/CTA. 6/10, WBC jump to 89 K,  Temp of 102,Remains on 40 of Levo, 40 of Neo, and Vaso at 0.03.Pupils are 6 and non-responsive to light. Trialysis cath inserted for CVVHD. R IJ is 6 days old, will get PICC and discontinue. Lactate of 2.8 6/12 coming down on pressors? Patient self extubated and reintubated 6/13 MRI   Interim History / Subjective:   Afebrile; WBC trending down 37.6 (44.8 yesterday) ID stopped antifungal and flagyl; continued cefepime On CRRT On levo, vaso, amio, heparin, and fentanyl Patient more awake today and following commands, pupils still dilated and non reactive, minimal gag/cough.   Objective   Blood pressure (!) 106/56, pulse 94, temperature 98.6 F (37 C), temperature source Oral, resp. rate (!) 28, height '6\' 2"'  (1.88 m), weight 72.6 kg, SpO2 100 %. 4LNC    Vent Mode: PRVC FiO2 (%):  [40 %-60 %] 40 % Set Rate:  [28 bmp-34 bmp] 28 bmp Vt Set:  [570 mL] 570 mL PEEP:  [5 cmH20] 5 cmH20 Plateau Pressure:  [19 cmH20-25 cmH20] 20 cmH20   Intake/Output Summary (Last 24 hours) at 04/06/2021 0820 Last data filed at 04/06/2021 0800 Gross per 24 hour  Intake 3855.35 ml  Output 5895 ml  Net -2039.65 ml    Filed Weights   04/01/21 0500 04/02/21 0452 04/06/21 0442  Weight: 78.3 kg 75.7 kg 72.6 kg   Physical Examination: General:  critically ill appearing intubated patient HEENT: MM pink/moist; ETT in place; clot appreciated on ultrasound in RIJ Neuro: awake and following commands more today; Pupils remain dilated and non reactive to light; minimal gag/cough reflex CV: s1s2, RRR, no m/r/g PULM:  BS dim clear bilaterally GI: soft, bsx4 active  Extremities: warm/dry, Left HD cath in place Skin: no rashes  or lesions   Labs/imaging that I have personally reviewed: (right click and "Reselect all SmartList Selections" daily)    WBC 37.6 (44.8 yesterday) K 3.8, Mag 2 Creat 1.25 (1.39 yesterday) Hgb 7.6 (8.3 yesterday)  BC: Staph epidermidis and MR mecA/C (likely contaminated; other set of  cultures pending)  CXR: ETT in satisfactory position; Increasing patchy infiltrate L lung; Left effusion stable   Resolved Hospital Problem list   Fever  Assessment & Plan:   Acute encephalopathy prompting Code Stroke, work-up negative Patient became acutely unresponsive 6/9 with fixed and dilated pupils, minimal gag, breathing spontaneously. CT head and EEG negative Unclear what caused this episode.  Work-up as so far been unrevealing Plan: -MRI? -more awake today and following commands; limit sedation for frequent neuro exams  Acute hypoxemic respiratory failure Bilateral pleural effusions;  inferior R sided pigtail tube no longer in place, removed 6/10; surgical CT removed 6/7 Likely LLL HAP vs pleural effusion Plan: -continue mech ventilation 8 cc/kg; -wean fio2 for O2 sat goal >90% -VAP prevention in place  New Leukocytosis 6/10: BC showing staph epi and Meth resistant mec A; likely contaminant; other BC pending Plan: -ID following -continue cefepime; dc'd antifungal and flagyl -continue to trend CBC/fever curve  Post-op mediastinal hemorrhage Cirrhotic and hemorrhage- induced consumptive coagulopathy Hemorrhagic shock, resolved Acute blood loss anemia (improving) Thrombocytopenia- (Improving): in setting of blood loss, coagulopathy, and cirrhosis Elevated Alk Phos/AST Plan: -trend CBC and transfuse for hgb <7  In hospital cardiac arrest precipitated by respiratory distress- PEA, minimal downtime. 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 VT- On 6/1 Plan: -HF following -continue Levo, Vaso for MAP >65 -continue amio and heparin   ESRD on HD PTA: known IJ stenosis and concern of R IJ stenosis; difficult access, RUE AVF Trialysis catheter placed 6/10 Plan: -Nephro following -continue CVVHD -Trend BMP  DM2, controlled Plan: -SSI and Lantus -CBG monitoring   Best practice  (right click and "Reselect all SmartList Selections" daily)  Diet:  NPO>> Tube feeds Pain/Anxiety/Delirium protocol (if indicated): Yes (RASS goal 0) VAP protocol (if indicated): Yes DVT prophylaxis: Systemic AC  GI prophylaxis: PPI Glucose control:  SSI Yes and Basal insulin Yes Central venous access:  Yes, and it is still needed Arterial line:  N/A  Foley:  N/A Mobility:  bed rest  PT consulted: N/A Last date of multidisciplinary goals of care discussion [pending] Code Status:  full code Disposition: ICU  This patient is critically ill with multiple organ system failure; which, requires frequent high complexity decision making, assessment, support, evaluation, and titration of therapies. This was completed through the application of advanced monitoring technologies and extensive interpretation of multiple databases. During this encounter critical care time was devoted to patient care services described in this note for 35 minutes.  JD Rexene Agent [facility] 04/06/2021, 8:20 AM  Please see [email or web address] for pager details.  From 7A-7P if no response, please call [phone]. After hours, please call ELink [phone].

## 2021-04-06 NOTE — Progress Notes (Signed)
Pt placed on PSV 12/5 by CCM. Pt is tolerating well at this time. RN at bedside, RT to continue to monitor.

## 2021-04-06 NOTE — Progress Notes (Signed)
Pt transported to CT on full vent support. No complications noted.

## 2021-04-07 ENCOUNTER — Inpatient Hospital Stay (HOSPITAL_COMMUNITY): Payer: Medicare Other

## 2021-04-07 DIAGNOSIS — G928 Other toxic encephalopathy: Secondary | ICD-10-CM

## 2021-04-07 DIAGNOSIS — R4182 Altered mental status, unspecified: Secondary | ICD-10-CM | POA: Diagnosis not present

## 2021-04-07 DIAGNOSIS — Z978 Presence of other specified devices: Secondary | ICD-10-CM

## 2021-04-07 LAB — CBC
HCT: 28.2 % — ABNORMAL LOW (ref 39.0–52.0)
Hemoglobin: 9.3 g/dL — ABNORMAL LOW (ref 13.0–17.0)
MCH: 30.8 pg (ref 26.0–34.0)
MCHC: 33 g/dL (ref 30.0–36.0)
MCV: 93.4 fL (ref 80.0–100.0)
Platelets: 195 10*3/uL (ref 150–400)
RBC: 3.02 MIL/uL — ABNORMAL LOW (ref 4.22–5.81)
RDW: 21.6 % — ABNORMAL HIGH (ref 11.5–15.5)
WBC: 27.3 10*3/uL — ABNORMAL HIGH (ref 4.0–10.5)
nRBC: 0.2 % (ref 0.0–0.2)

## 2021-04-07 LAB — RENAL FUNCTION PANEL
Albumin: 1.9 g/dL — ABNORMAL LOW (ref 3.5–5.0)
Albumin: 2 g/dL — ABNORMAL LOW (ref 3.5–5.0)
Anion gap: 13 (ref 5–15)
Anion gap: 7 (ref 5–15)
BUN: 19 mg/dL (ref 6–20)
BUN: 18 mg/dL (ref 6–20)
CO2: 22 mmol/L (ref 22–32)
CO2: 27 mmol/L (ref 22–32)
Calcium: 8 mg/dL — ABNORMAL LOW (ref 8.9–10.3)
Calcium: 7.7 mg/dL — ABNORMAL LOW (ref 8.9–10.3)
Chloride: 100 mmol/L (ref 98–111)
Chloride: 101 mmol/L (ref 98–111)
Creatinine, Ser: 1.39 mg/dL — ABNORMAL HIGH (ref 0.61–1.24)
Creatinine, Ser: 1.33 mg/dL — ABNORMAL HIGH (ref 0.61–1.24)
GFR, Estimated: 60 mL/min — ABNORMAL LOW (ref >60)
GFR, Estimated: >60 mL/min (ref >60)
Glucose, Bld: 145 mg/dL — ABNORMAL HIGH (ref 70–99)
Glucose, Bld: 124 mg/dL — ABNORMAL HIGH (ref 70–99)
Phosphorus: 1.5 mg/dL — ABNORMAL LOW (ref 2.5–4.6)
Phosphorus: 4.5 mg/dL (ref 2.5–4.6)
Potassium: 4.2 mmol/L (ref 3.5–5.1)
Potassium: 4.5 mmol/L (ref 3.5–5.1)
Sodium: 135 mmol/L (ref 135–145)
Sodium: 135 mmol/L (ref 135–145)

## 2021-04-07 LAB — BASIC METABOLIC PANEL
Anion gap: 7 (ref 5–15)
BUN: 18 mg/dL (ref 6–20)
CO2: 24 mmol/L (ref 22–32)
Calcium: 7.7 mg/dL — ABNORMAL LOW (ref 8.9–10.3)
Chloride: 104 mmol/L (ref 98–111)
Creatinine, Ser: 1.44 mg/dL — ABNORMAL HIGH (ref 0.61–1.24)
GFR, Estimated: 57 mL/min — ABNORMAL LOW (ref >60)
Glucose, Bld: 172 mg/dL — ABNORMAL HIGH (ref 70–99)
Potassium: 3.7 mmol/L (ref 3.5–5.1)
Sodium: 135 mmol/L (ref 135–145)

## 2021-04-07 LAB — GLUCOSE, CAPILLARY
Glucose-Capillary: 197 mg/dL — ABNORMAL HIGH (ref 70–99)
Glucose-Capillary: 205 mg/dL — ABNORMAL HIGH (ref 70–99)
Glucose-Capillary: 177 mg/dL — ABNORMAL HIGH (ref 70–99)
Glucose-Capillary: 218 mg/dL — ABNORMAL HIGH (ref 70–99)

## 2021-04-07 LAB — COMPREHENSIVE METABOLIC PANEL
ALT: 21 U/L (ref 0–44)
AST: 41 U/L (ref 15–41)
Albumin: 2.3 g/dL — ABNORMAL LOW (ref 3.5–5.0)
Alkaline Phosphatase: 202 U/L — ABNORMAL HIGH (ref 38–126)
Anion gap: 14 (ref 5–15)
BUN: 20 mg/dL (ref 6–20)
CO2: 21 mmol/L — ABNORMAL LOW (ref 22–32)
Calcium: 8 mg/dL — ABNORMAL LOW (ref 8.9–10.3)
Chloride: 99 mmol/L (ref 98–111)
Creatinine, Ser: 1.25 mg/dL — ABNORMAL HIGH (ref 0.61–1.24)
GFR, Estimated: >60 mL/min (ref >60)
Glucose, Bld: 219 mg/dL — ABNORMAL HIGH (ref 70–99)
Potassium: 6.2 mmol/L — ABNORMAL HIGH (ref 3.5–5.1)
Sodium: 134 mmol/L — ABNORMAL LOW (ref 135–145)
Total Bilirubin: 2.1 mg/dL — ABNORMAL HIGH (ref 0.3–1.2)
Total Protein: 7 g/dL (ref 6.5–8.1)

## 2021-04-07 LAB — CBC WITH DIFFERENTIAL/PLATELET
Abs Immature Granulocytes: 0 10*3/uL (ref 0.00–0.07)
Basophils Absolute: 0 10*3/uL (ref 0.0–0.1)
Basophils Relative: 0 %
Eosinophils Absolute: 0 10*3/uL (ref 0.0–0.5)
Eosinophils Relative: 0 %
HCT: 36.8 % — ABNORMAL LOW (ref 39.0–52.0)
Hemoglobin: 11.7 g/dL — ABNORMAL LOW (ref 13.0–17.0)
Lymphocytes Relative: 2 %
Lymphs Abs: 0.6 10*3/uL — ABNORMAL LOW (ref 0.7–4.0)
MCH: 31 pg (ref 26.0–34.0)
MCHC: 31.8 g/dL (ref 30.0–36.0)
MCV: 97.6 fL (ref 80.0–100.0)
Monocytes Absolute: 0.9 10*3/uL (ref 0.1–1.0)
Monocytes Relative: 3 %
Neutro Abs: 28 10*3/uL — ABNORMAL HIGH (ref 1.7–7.7)
Neutrophils Relative %: 95 %
Platelets: 215 10*3/uL (ref 150–400)
RBC: 3.77 MIL/uL — ABNORMAL LOW (ref 4.22–5.81)
RDW: 23.8 % — ABNORMAL HIGH (ref 11.5–15.5)
WBC: 29.5 10*3/uL — ABNORMAL HIGH (ref 4.0–10.5)
nRBC: 0 /100 WBC
nRBC: 0.5 % — ABNORMAL HIGH (ref 0.0–0.2)

## 2021-04-07 LAB — TYPE AND SCREEN
ABO/RH(D): A POS
Antibody Screen: NEGATIVE
Unit division: 0

## 2021-04-07 LAB — POCT I-STAT 7, (LYTES, BLD GAS, ICA,H+H)
Acid-Base Excess: 2 mmol/L (ref 0.0–2.0)
Bicarbonate: 26.9 mmol/L (ref 20.0–28.0)
Calcium, Ion: 1.11 mmol/L — ABNORMAL LOW (ref 1.15–1.40)
HCT: 35 % — ABNORMAL LOW (ref 39.0–52.0)
Hemoglobin: 11.9 g/dL — ABNORMAL LOW (ref 13.0–17.0)
O2 Saturation: 100 %
Patient temperature: 97.5
Potassium: 4.5 mmol/L (ref 3.5–5.1)
Sodium: 135 mmol/L (ref 135–145)
TCO2: 28 mmol/L (ref 22–32)
pCO2 arterial: 42.6 mmHg (ref 32.0–48.0)
pH, Arterial: 7.406 (ref 7.350–7.450)
pO2, Arterial: 326 mmHg — ABNORMAL HIGH (ref 83.0–108.0)

## 2021-04-07 LAB — AMMONIA: Ammonia: 18 umol/L (ref 9–35)

## 2021-04-07 LAB — HEPARIN LEVEL (UNFRACTIONATED)
Heparin Unfractionated: <0.1 IU/mL — ABNORMAL LOW (ref 0.30–0.70)
Heparin Unfractionated: 0.21 IU/mL — ABNORMAL LOW (ref 0.30–0.70)
Heparin Unfractionated: 0.36 IU/mL (ref 0.30–0.70)

## 2021-04-07 LAB — BPAM RBC
Blood Product Expiration Date: 202207032359
ISSUE DATE / TIME: 202206141321
Unit Type and Rh: 6200

## 2021-04-07 LAB — SEROTONIN RELEASE ASSAY (SRA)
SRA .2 IU/mL UFH Ser-aCnc: <1 % (ref 0–20)
SRA 100IU/mL UFH Ser-aCnc: <1 % (ref 0–20)

## 2021-04-07 LAB — VITAMIN B12: Vitamin B-12: 2777 pg/mL — ABNORMAL HIGH (ref 180–914)

## 2021-04-07 LAB — MAGNESIUM
Magnesium: 2.1 mg/dL (ref 1.7–2.4)
Magnesium: 2.4 mg/dL (ref 1.7–2.4)

## 2021-04-07 LAB — TSH: TSH: 6.13 u[IU]/mL — ABNORMAL HIGH (ref 0.350–4.500)

## 2021-04-07 LAB — PHOSPHORUS: Phosphorus: 4 mg/dL (ref 2.5–4.6)

## 2021-04-07 MED ORDER — ETOMIDATE 2 MG/ML IV SOLN: 10.0000 mg | Freq: Once | INTRAVENOUS | Status: AC

## 2021-04-07 MED ORDER — DOCUSATE SODIUM 50 MG/5ML PO LIQD
100.0000 mg | Freq: Two times a day (BID) | ORAL | Status: DC
  Administered 2021-04-08 – 2021-04-13 (×9): 100 mg
  Filled 2021-04-07 (×10): qty 10

## 2021-04-07 MED ORDER — ROCURONIUM BROMIDE 10 MG/ML (PF) SYRINGE
PREFILLED_SYRINGE | INTRAVENOUS | Status: AC
  Administered 2021-04-07: 50 mg via INTRAVENOUS
  Filled 2021-04-07: qty 10

## 2021-04-07 MED ORDER — VITAL 1.5 CAL PO LIQD
1000.0000 mL | ORAL | Status: DC
  Administered 2021-04-07 – 2021-04-13 (×4): 1000 mL
  Filled 2021-04-07 (×2): qty 1000

## 2021-04-07 MED ORDER — ROCURONIUM BROMIDE 50 MG/5ML IV SOLN
50.0000 mg | Freq: Once | INTRAVENOUS | Status: AC
  Filled 2021-04-07: qty 5

## 2021-04-07 MED ORDER — DEXTROSE-NACL 10-0.45 % IV SOLN
INTRAVENOUS | Status: DC
  Filled 2021-04-07 (×3): qty 1000

## 2021-04-07 MED ORDER — PHENYLEPHRINE 40 MCG/ML (10ML) SYRINGE FOR IV PUSH (FOR BLOOD PRESSURE SUPPORT)
PREFILLED_SYRINGE | INTRAVENOUS | Status: AC
  Filled 2021-04-07: qty 10

## 2021-04-07 MED ORDER — ETOMIDATE 2 MG/ML IV SOLN
INTRAVENOUS | Status: AC
  Administered 2021-04-07: 10 mg via INTRAVENOUS
  Filled 2021-04-07: qty 20

## 2021-04-07 MED ORDER — FENTANYL CITRATE (PF) 100 MCG/2ML IJ SOLN: 25.0000 ug | INTRAMUSCULAR | Status: DC | PRN

## 2021-04-07 MED ORDER — VASOPRESSIN 20 UNITS/100 ML INFUSION FOR SHOCK
0.0300 [IU]/min | INTRAVENOUS | Status: DC
  Administered 2021-04-07 – 2021-04-09 (×4): 0.03 [IU]/min via INTRAVENOUS
  Filled 2021-04-07 (×4): qty 100

## 2021-04-07 MED ORDER — DEXMEDETOMIDINE HCL IN NACL 400 MCG/100ML IV SOLN
0.0000 ug/kg/h | INTRAVENOUS | Status: DC
  Administered 2021-04-07: 0.4 ug/kg/h via INTRAVENOUS
  Administered 2021-04-08: 0.5 ug/kg/h via INTRAVENOUS
  Filled 2021-04-07 (×2): qty 100

## 2021-04-07 MED ORDER — SODIUM PHOSPHATES 45 MMOLE/15ML IV SOLN
30.0000 mmol | Freq: Once | INTRAVENOUS | Status: AC
  Administered 2021-04-07: 30 mmol via INTRAVENOUS
  Filled 2021-04-07: qty 10

## 2021-04-07 MED ORDER — POLYETHYLENE GLYCOL 3350 17 G PO PACK: 17.0000 g | PACK | Freq: Every day | ORAL | Status: DC

## 2021-04-07 NOTE — Progress Notes (Signed)
ANTICOAGULATION CONSULT NOTE  Pharmacy Consult for heparin Indication: atrial flutter  Allergies  Allergen Reactions   Shellfish-Derived Products Anaphylaxis   Wasp Venom Protein Anaphylaxis   Penicillins Other (See Comments)    Childhood reaction    Patient Measurements: Height: '6\' 2"'$  (188 cm) Weight: 72.4 kg (159 lb 9.8 oz) IBW/kg (Calculated) : 82.2 Heparin Dosing Weight: 79kg  Vital Signs: Temp: 99.3 F (37.4 C) (06/15 0733) Temp Source: Oral (06/15 0733) BP: 112/50 (06/15 0600) Pulse Rate: 88 (06/15 0600)  Labs: Recent Labs    04/06/21 0055 04/06/21 0315 04/06/21 2054 04/07/21 0129 04/07/21 0244  HGB 7.6*  --  10.2*  --  9.3*  HCT 23.6*  --  30.6*  --  28.2*  PLT 200  --  216  --  195  HEPARINUNFRC 0.89* <0.10*  --   --  <0.10*  CREATININE 1.25*  --  1.49* 1.39* 1.44*     Estimated Creatinine Clearance: 59.4 mL/min (A) (by C-G formula based on SCr of 1.44 mg/dL (H)).   Medical History: Past Medical History:  Diagnosis Date   ESRD (end stage renal disease) ([place])    Hypertension    Peripheral arterial disease ([place])    Type 2 diabetes mellitus ([place])    Assessment: 47 YOF with recent CABG now with postop afib/flutter. HIT level was not actually sent though a result exists in chart, ok to disregard after discussion with team.   Heparin level difficult to obtain due to limited access. 6/11 Foot stick unsuccessful. RN will draw future levels from red CRRT port.   Heparin level still below goal (<0.1), on heparin infusion at 2600 units/hr. Hgb 9.3 (s/p 1 PRBC 6/14), plt 195. AT 23 on 6/13 - team aware.   Plan for possible PEG/trach in future so will keep on heparin for now. No PICC can be placed - heparin switched to run centrally and level being drawn peripherally.  Goal of Therapy:  Heparin level 0.3-0.5  units/ml  Monitor platelets by anticoagulation protocol: Yes   Plan:  F/u peripheral level after infusion site switched Will monitor for  bleeding.  Antonietta Jewel, PharmD, [place] Clinical Pharmacist  Phone: [phone] 04/07/2021 7:51 AM  Please check AMION for all [place] phone numbers After 10:00 PM, call [place] [phone]

## 2021-04-07 NOTE — Progress Notes (Addendum)
NAME:  David Rose., MRN:  [id number], DOB:  07-Apr-1966, LOS: 23 ADMISSION DATE:  03/18/2021, CONSULTATION DATE:  04/06/2021 REFERRING MD:  TCTS CHIEF COMPLAINT: Code   History of Present Illness:  55 year old man with PMHx significant for HTN, T2DM, PAD, ESRD, cirrhosis (query cardiac etiology) who presented for symptomatic ischemic cardiomyopathy.    Patient underwent preoperative optimization with CHF, GI, and Nephrology teams with eventual CABG 5/31. On 6/1, patient developed respiratory distress followed by PEA arrest requiring CPR. Code was c/b increased bleeding around surgical sites, prompting mediastinal exploration 6/1. Intraoperative findings were notable for bleeding around mammary graft site and diffuse oozing for which patient received multiple blood products.   Patient intubated and on pressors postoperatively and PCCM was consulted for assistance with management.  Pertinent Medical History:  ESRD on HD Cardiac cirrhosis with portal hypertension IBS IDDM  Significant Hospital Events: Including procedures, antibiotic start and stop dates in addition to other pertinent events   5/23 Admitted 5/26 Paracentesis 4L 5/30 HD 5/31 CABG 5/31- 6/1 Code Blue, OR for mediastinal exploration 2/2 hemorrhagic shock induced by CPR. Factor 7, protamine, FFP, cryo given 6/1 PCCM consult, 4U PRBCs and 1U Plt . R pigtail chest tube placed due to limitation of vent weaning. 6/2 Extubated, chest tube 3.2L out past 24 hrs 6/3 Remains on 2L [place], chest tube with 500cc out/ 24 hrs 6/4 Remains on CRRT, 2L [place], [place], NE. 3 chest tubes remain. 6/6 Remains on CRRT, 4 chest tubes in place 6/9 Decreasing pressor requirements and CT output. CRRT transitioned to iHD. Echo repeated. Prior to HD session, patient suddenly became unresponsive with fixed and dilated pupils, minimal gag, breathing spontaneously. Code Stroke called. Reintubated, heparin d/c'ed, STAT CT Head/CTA. 6/10, WBC jump to 89 K,  Temp of 102,Remains on 40 of Levo, 40 of Neo, and Vaso at 0.03.Pupils are 6 and non-responsive to light. Trialysis cath inserted for CVVHD. R IJ is 78 days old, will get PICC and discontinue. Lactate of 2.8 6/12 coming down on pressors? Patient self extubated and reintubated 6/13 MRI negative for acute findings 6/15 Patient extubated; spoke with family and patient, if he fails extubation trial and requires reintubation they are okay with trach placement. Levo at 8   Interim History / Subjective:   Intubated on SBT this morning Afebrile; WBC trending down 27.3 (29.4) ID following: continuing cefepime On CRRT Levo down to 8 On amio, heparin, and fentanyl Patient awake and oriented following commands; right pupil 4 mm dilated and sluggish reactive to light; left pupil 6 mm dilated and sluggish reactive to light Weak cough/gag reflex   Objective   Blood pressure (!) 87/46, pulse (!) 101, temperature 99.3 F (37.4 C), temperature source Oral, resp. rate (!) 27, height _0  (1.88 m), weight 72.4 kg, SpO2 96 %. 4LNC    Vent Mode: PSV;CPAP FiO2 (%):  [40 %] 40 % Set Rate:  [28 bmp] 28 bmp Vt Set:  [570 mL] 570 mL PEEP:  [5 cmH20-53 cmH20] 53 cmH20 Pressure Support:  [12 cmH20] 12 cmH20 Plateau Pressure:  [13 cmH20-22 cmH20] 13 cmH20   Intake/Output Summary (Last 24 hours) at 04/07/2021 0854 Last data filed at 04/07/2021 0800 Gross per 24 hour  Intake 3986.96 ml  Output 3672 ml  Net 314.96 ml    Filed Weights   04/02/21 0452 04/06/21 0442 04/07/21 0438  Weight: 75.7 kg 72.6 kg 72.4 kg   Physical Examination: General:  critically ill appearing intubated patient HEENT: MM pink/moist;  ETT in place; clot appreciated on ultrasound in RIJ Neuro: Patient awake and oriented following commands; right pupil 4 mm dilated and sluggish reactive to light; left pupil 6 mm dilated and sluggish reactive to light Weak cough/gag reflex CV: s1s2, RRR, no m/r/g PULM:  BS dim clear bilaterally GI:  soft, bsx4 active  Extremities: warm/dry, Left HD cath in place Skin: no rashes or lesions   Labs/imaging that I have personally reviewed: (right click and "Reselect all SmartList Selections" daily)    WBC 27.3 (29.4); tmax 99 Creat 1.44 (1.39) K 3.7, mag 2.1 Hgb 9.3 Glucose range 123-205  BC: Staph epidermidis and MR mecA/C (likely contaminated; other set of cultures pending)   Resolved Hospital Problem list   Fever  Assessment & Plan:   Acute encephalopathy prompting Code Stroke, work-up negative Patient became acutely unresponsive 6/9 with fixed and dilated pupils, minimal gag, breathing spontaneously. CT head, MRI, and EEG negative Unclear what caused this episode.  Work-up as so far been unrevealing Plan: -Mental status improved; pupils sluggishly reactive to light  -Limit sedative medications for frequent neuro exams  Acute hypoxemic respiratory failure Bilateral pleural effusions;  inferior R sided pigtail tube no longer in place, removed 6/10; surgical CT removed 6/7 Likely LLL HAP vs pleural effusion Plan: -Passed SBT and following commands -Will extubate today -Dr. Tacy Learn spoke with family today; plan for trach if requires reintubation -wean O2 for O2 sat goal >90% -Pulmonary Toiletry: CPT Q4 and IS -Continue Duoneb TID  New Leukocytosis 6/10: BC showing staph epi and Meth resistant mec A; likely contaminant; other BC pending Plan: -ID following -continue cefepime -continue to trend CBC/fever curve  Post-op mediastinal hemorrhage Cirrhotic and hemorrhage- induced consumptive coagulopathy Hemorrhagic shock, resolved Acute blood loss anemia (improving) Thrombocytopenia- (Improving): in setting of blood loss, coagulopathy, and cirrhosis Elevated Alk Phos/AST Plan: -trend CBC and transfuse for hgb <7  In hospital cardiac arrest precipitated by respiratory distress- PEA, minimal downtime. CAD post CABG Cardiac Cirrhosis with ascites Hx HTN PH; likely  mixed arterial & venous. 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 VT- On 6/1 Plan: -HF following -continue to wean Levo for MAP >65 -continue amio and heparin   ESRD on HD PTA: known IJ stenosis and concern of R IJ stenosis; difficult access, RUE AVF Trialysis catheter placed 6/10 Plan: -Nephro following -continue CVVHD -Trend BMP  DM2  Plan: -continue SSI and Lantus -CBG monitoring  -sugars likely to drop today being off tube feeding and NPO for extubation   Best practice (right click and "Reselect all SmartList Selections" daily)  Diet:  NPO Pain/Anxiety/Delirium protocol (if indicated): No VAP protocol (if indicated): Not indicated DVT prophylaxis: Systemic AC  GI prophylaxis: PPI Glucose control:  SSI Yes and Basal insulin Yes Central venous access:  Yes, and it is still needed Arterial line:  N/A  Foley:  N/A Mobility:  bed rest  PT consulted: N/A Last date of multidisciplinary goals of care discussion [6/15 Dr. Tacy Learn spoke with family on phone and decided if patient requires reintubation they are okay with tracheostomy] Code Status:  full code Disposition: ICU  This patient is critically ill with multiple organ system failure; which, requires frequent high complexity decision making, assessment, support, evaluation, and titration of therapies. This was completed through the application of advanced monitoring technologies and extensive interpretation of multiple databases. During this encounter critical care time was devoted to patient care services described in this note for 35 minutes.  JD Rexene Agent [facility]  04/07/2021, 8:54 AM  Please see [email or web address] for pager details.  From 7A-7P if no response, please call [phone]. After hours, please call ELink [phone].

## 2021-04-07 NOTE — Consult Note (Deleted)
Neurology Consultation  Reason for Consult: Altered mental status Referring Physician: Dr. Tacy Learn PCCM  CC: Altered mental status  History is obtained from: Chart review  HPI: David Rose. is a 55 y.o. male with medical history of ESRD, hypertension, coronary artery disease, peripheral artery disease, type 2 diabetes, cirrhosis who presented with symptomatic ischemic cardiomyopathy and underwent perioperative optimization of CHF GI and nephrology teams with eventual CABG on 03/09/2021.  On June 1 he developed respiratory distress followed by PEA arrest requiring CPR.  Code was canceled because of increased bleeding around surgical sites prompting mediastinal exploration on March 24, 2021 with intraoperative findings notable for bleeding around mammary graft site and diffuse oozing for which patient received multiple blood products.  He was intubated and on pressors postoperatively and was being cared for by multiple teams including nephrology, and critical care along with cardiology and cardiothoracic surgery. He was doing well, following commands with a last known well of sometime around 6 PM today after which she became acutely unresponsive and required emergent intubation for airway protection. He has had similar episode of unresponsiveness in the past during this admission as well.  That was felt to be due to multiple toxic metabolic derangements including hypoxic respiratory failure, some component of hypoxic ischemic encephalopathy, AKI, and mixed cardiogenic shock.  Course also complicated by atrial fibrillation-currently in atypical atrial flutter.  On amiodarone per tube and heparin gtt. Today, due to unclear etiology of his Vernie Murders was concern for possible underlying seizures for which neurological consultation was obtained. Stat EEG was also requested. He had an MRI of the brain on 04/06/2021- No acute abnormalities, chronic small vessel infarcts within the centrum semiovale  bilaterally and bilateral mastoid effusions and fluid in the nasopharynx.   LKW: 6 PM today tpa given?: no, nonfocal exam Premorbid modified Rankin scale (mRS): 5  ROS: Unable to obtain due to altered mental status.   Past Medical History:  Diagnosis Date   ESRD (end stage renal disease) ([place])    Hypertension    Peripheral arterial disease ([place])    Type 2 diabetes mellitus ([place])    Family History  Problem Relation Age of Onset   Thyroid cancer Mother    Leukemia Father      Social History:   reports that he has never smoked. He has never used smokeless tobacco. No history on file for alcohol use and drug use.  Medications  Current Facility-Administered Medications:     prismasol BGK 4/2.5 infusion, , CRRT, Continuous, Coladonato, Joseph, MD, Last Rate: 500 mL/hr at 04/07/21 1727, New Bag at 04/07/21 1727    prismasol BGK 4/2.5 infusion, , CRRT, Continuous, Coladonato, Broadus John, MD, Last Rate: 300 mL/hr at 04/07/21 1002, New Bag at 04/07/21 1002   0.9 %  sodium chloride infusion, , Intravenous, Continuous, Icard, Bradley L, DO, Stopped at 04/07/21 0851   amiodarone (PACERONE) tablet 200 mg, 200 mg, Per Tube, BID, Larey Dresser, MD, 200 mg at 04/07/21 0919   aspirin chewable tablet 81 mg, 81 mg, Per Tube, Daily, Wonda Olds, MD, 81 mg at 04/07/21 0919   atorvastatin (LIPITOR) tablet 40 mg, 40 mg, Per Tube, Daily, Atkins, Glenice Bow, MD, 40 mg at 04/07/21 0919   bisacodyl (DULCOLAX) EC tablet 10 mg, 10 mg, Oral, Daily, 10 mg at 04/07/21 0919 **OR** bisacodyl (DULCOLAX) suppository 10 mg, 10 mg, Rectal, Daily, Gold, Wayne E, PA-C, 10 mg at 04/16/2021 1351   ceFEPIme (MAXIPIME) 2 g in sodium chloride 0.9 %  100 mL IVPB, 2 g, Intravenous, Q12H, Comer, Okey Regal, MD, Stopped at 04/07/21 1039   chlorhexidine gluconate (MEDLINE KIT) (PERIDEX) 0.12 % solution 15 mL, 15 mL, Mouth Rinse, BID, Clark, Laura P, DO, 15 mL at 04/07/21 0753   Chlorhexidine Gluconate Cloth 2 % PADS 6 each, 6  each, Topical, Daily, Wonda Olds, MD, 6 each at 04/07/21 0919   Darbepoetin Alfa (ARANESP) injection 60 mcg, 60 mcg, Subcutaneous, Q Thu-1800, Elmarie Shiley, MD   dexmedetomidine (PRECEDEX) 400 MCG/100ML (4 mcg/mL) infusion, 0-1.2 mcg/kg/hr, Intravenous, Continuous, Estill Cotta, NP   docusate (COLACE) 50 MG/5ML liquid 100 mg, 100 mg, Per Tube, BID, Estill Cotta, NP   feeding supplement (PROSource TF) liquid 45 mL, 45 mL, Per Tube, QID, Icard, Bradley L, DO, 45 mL at 04/07/21 1922   feeding supplement (VITAL 1.5 CAL) liquid 1,000 mL, 1,000 mL, Per Tube, Continuous, Atkins, Glenice Bow, MD, Last Rate: 65 mL/hr at 04/07/21 1446, 1,000 mL at 04/07/21 1446   fentaNYL (SUBLIMAZE) injection 25-50 mcg, 25-50 mcg, Intravenous, Q15 min PRN, Estill Cotta, NP   fentaNYL (SUBLIMAZE) injection 25-50 mcg, 25-50 mcg, Intravenous, Q30 min PRN, Estill Cotta, NP   fentaNYL 2547mg in NS 2557m(1077mml) infusion-PREMIX, 0-400 mcg/hr, Intravenous, Continuous, Icard, Bradley L, DO, Stopped at 04/07/21 0945   heparin ADULT infusion 100 units/mL (25000 units/250m63m2,800 Units/hr, Intravenous, Continuous, MillPriscella MannH, Last Rate: 28 mL/hr at 04/07/21 1952, 2,800 Units/hr at 04/07/21 1952   heparin injection 1,000-6,000 Units, 1,000-6,000 Units, CRRT, PRN, ColaDonato Heinz   insulin aspart (novoLOG) injection 0-24 Units, 0-24 Units, Subcutaneous, Q4H, Atkins, BroaGlenice Bow, 2 Units at 04/07/21 1727   insulin glargine (LANTUS) injection 10 Units, 10 Units, Subcutaneous, Daily, ClarJulian Hy, 10 Units at 04/07/21 0919   ipratropium-albuterol (DUONEB) 0.5-2.5 (3) MG/3ML nebulizer solution 3 mL, 3 mL, Nebulization, TID, Atkins, Broadus Z, MD, 3 mL at 04/07/21 2000   lactated ringers infusion 500 mL, 500 mL, Intravenous, Once PRN, Gold, Wayne E, PA-C   lactated ringers infusion, , Intravenous, Continuous, Gold, Wayne E, PA-C, Stopped at 04/01/21 1902   lidocaine (PF) (XYLOCAINE) 1 %  injection, , , PRN, Bruning, Kevin, PA-C, 10 mL at 03/18/21 1500   lip balm (CARMEX) ointment, , Topical, PRN, AtkiWonda Olds, Given at 03/25/21 2247   MEDLINE mouth rinse, 15 mL, Mouth Rinse, 10 times per day, ClarNoemi ChapelDO, 15 mL at 04/07/21 1731   metoprolol tartrate (LOPRESSOR) injection 2.5-5 mg, 2.5-5 mg, Intravenous, Q2H PRN, Gold, Wayne E, PA-C   midodrine (PROAMATINE) tablet 15 mg, 15 mg, Per Tube, TID WC, McLeLarey Dresser, 15 mg at 04/07/21 1639   morphine 2 MG/ML injection 1-4 mg, 1-4 mg, Intravenous, Q1H PRN, Gold, Wayne E, PA-C, 4 mg at 04/02/21 [id number] (RENA-VIT) tablet 1 tablet, 1 tablet, Per Tube, QHS, AtkiWonda Olds, 1 tablet at 04/06/21 2123   norepinephrine (LEVOPHED) 16 mg in 250mL3mmix infusion, 0-40 mcg/min, Intravenous, Titrated, Atkins, BroadGlenice Bow Last Rate: 9.38 mL/hr at 04/07/21 1800, 10 mcg/min at 04/07/21 1800   ondansetron (ZOFRAN) injection 4 mg, 4 mg, Intravenous, Q6H PRN, Gold, Wayne E, PA-C, 4 mg at 03/31/21 0212 7517yCODONE (ROXICODONE) 5 MG/5ML solution 5-10 mg, 5-10 mg, Per Tube, Q3H PRN, Atkins, BroadGlenice Bow  pantoprazole sodium (PROTONIX) 40 mg/20 mL oral suspension 40 mg, 40 mg, Per Tube, Daily, Icard, Bradley L, DO, 40 mg at  04/07/21 0919   polyethylene glycol (MIRALAX / GLYCOLAX) packet 17 g, 17 g, Per Tube, Daily, Orvan Seen, Glenice Bow, MD, 17 g at 04/07/21 4034   prismasol BGK 4/2.5 infusion, , CRRT, Continuous, Donato Heinz, MD, Last Rate: 1,500 mL/hr at 04/07/21 1639, New Bag at 04/07/21 1639   sodium chloride 0.9 % primer fluid for CRRT, , CRRT, PRN, Donato Heinz, MD   sodium chloride flush (NS) 0.9 % injection 10-40 mL, 10-40 mL, Intracatheter, Q12H, Orvan Seen, Glenice Bow, MD, 10 mL at 04/07/21 0920   sodium chloride flush (NS) 0.9 % injection 3 mL, 3 mL, Intravenous, Q12H, Gold, Wayne E, PA-C, 3 mL at 04/07/21 0920   sodium chloride flush (NS) 0.9 % injection 3 mL, 3 mL, Intravenous, PRN, Gold, Wayne E,  PA-C   Thrombi-Pad 3"X3" pad 1 each, 1 each, Topical, Once, Bowser, Laurel Dimmer, NP   vasopressin (PITRESSIN) 20 Units in sodium chloride 0.9 % 100 mL infusion-*FOR SHOCK*, 0.03 Units/min, Intravenous, Continuous, Estill Cotta, NP, Last Rate: 9 mL/hr at 04/07/21 1905, 0.03 Units/min at 04/07/21 1905  Exam: Current vital signs: BP (!) 149/54   Pulse 98   Temp (!) 96.5 F (35.8 C) (Axillary)   Resp (!) 30   Ht '6\' 2"'  (1.88 m)   Wt 72.4 kg   SpO2 100%   BMI 20.49 kg/m  Vital signs in last 24 hours: Temp:  [96.5 F (35.8 C)-99.3 F (37.4 C)] 96.5 F (35.8 C) (06/15 1609) Pulse Rate:  [45-118] 98 (06/15 2000) Resp:  [16-30] 30 (06/15 2000) BP: (61-168)/(26-146) 149/54 (06/15 1900) SpO2:  [96 %-100 %] 100 % (06/15 2000) FiO2 (%):  [40 %-100 %] 100 % (06/15 2000) Weight:  [72.4 kg] 72.4 kg (06/15 0438) Initially extremely unresponsive but later on exam as below: General: He is awake, alert, in some distress due to the ET tube HEENT: Normocephalic atraumatic, bitemporal muscle atrophy noted. CVS: Regular rate rhythm Respiratory: Vented Extremities warm well perfused without edema Neurological exam Is awake, alert, intubated.  On no sedation Is able to follow simple commands and lift his arms to command. Pupils are equal round reactive to light.  Extraocular movements unrestricted. Facial symmetry difficult to ascertain due to the tube Both lower extremities have equal movement but he was not able to raise them against gravity.  Both upper extremities he was able to raise against gravity for a few seconds. Sensation intact to touch. Difficult assess coordination and gait given his mentation, intubated status. NIH stroke scale 1a Level of Conscious.: 0 1b LOC Questions: 2 1c LOC Commands: 0 2 Best Gaze: 0 3 Visual: 0 4 Facial Palsy: 0 5a Motor Arm - left: 0 5b Motor Arm - Right: 0 6a Motor Leg - Left: 1 6b Motor Leg - Right: 1 7 Limb Ataxia: 0 8 Sensory: 0 9 Best Language:  3 10 Dysarthria: un (intubated) 11 Extinct. and Inatten.: 0 TOTAL: 7   Labs I have reviewed labs in epic and the results pertinent to this consultation are: WBC count of 27.3, anemia with hemoglobin 9.3, hematocrit 28.2, glucose 172, normal sodium and potassium, creatinine 1.33. ABG was just done a few minutes ago which shows a pH of 7.406, PO2 326, PCO2 42.6, bicarb 26.9  CBC    Component Value Date/Time   WBC 27.3 (H) 04/07/2021 0244   RBC 3.02 (L) 04/07/2021 0244   HGB 11.9 (L) 04/07/2021 2002   HCT 35.0 (L) 04/07/2021 2002   PLT 195 04/07/2021 0244   MCV  93.4 04/07/2021 0244   MCH 30.8 04/07/2021 0244   MCHC 33.0 04/07/2021 0244   RDW 21.6 (H) 04/07/2021 0244   LYMPHSABS 1.3 04/04/2021 0123   MONOABS 1.9 (H) 04/04/2021 0123   EOSABS 0.5 04/04/2021 0123   BASOSABS 0.3 (H) 04/04/2021 0123    CMP     Component Value Date/Time   NA 135 04/07/2021 2002   K 4.5 04/07/2021 2002   CL 101 04/07/2021 1504   CO2 27 04/07/2021 1504   GLUCOSE 124 (H) 04/07/2021 1504   BUN 18 04/07/2021 1504   CREATININE 1.33 (H) 04/07/2021 1504   CALCIUM 7.7 (L) 04/07/2021 1504   PROT 5.7 (L) 04/04/2021 0123   ALBUMIN 2.0 (L) 04/07/2021 1504   AST 40 04/04/2021 0123   ALT 24 04/04/2021 0123   ALKPHOS 198 (H) 04/04/2021 0123   BILITOT 2.1 (H) 04/04/2021 0123   GFRNONAA >60 04/07/2021 1504   Imaging I have reviewed the images obtained:  MRI examination of the brain-04/06/2021-no acute findings.  Stat EEG: I applied the [employer] rapid EEG leads. Preliminary read negative for seizures Official reading: IMPRESSION: This study is suggestive of moderate to severe diffuse encephalopathy, nonspecific etiology. No seizures or epileptiform discharges were seen throughout the recording.    Assessment: 55 year old with past medical history of ESRD, hypertension, coronary artery disease, peripheral artery disease, type 2 diabetes, cirrhosis, status post CABG, with a complicated hospital course due  to renal failure, respiratory distress and PEA arrest, along with atrial fibrillation-today with sudden onset of unresponsiveness. Concern for seizure/status epilepticus. Stat EEG negative for seizures.  Slowing indicative of moderate to severe diffuse encephalopathy was noted. By the time I repeatedly examined him, his exam was reassuring and nonfocal. Likely toxic metabolic encephalopathy in the setting of multiple toxic metabolic derangements. Given history of cirrhosis, I would also check ammonia levels If continues to revaccinating course, might benefit from continuous EEG.  Repeat imaging might be beneficial if he has any focal findings as he wakens up more-given atrial fibrillation Not a candidate for tPA due to being on anticoagulation currently with heparin.  Impression Multifactorial toxic metabolic encephalopathy  Recommendations: Check ammonia levels No need for continuous EEG-discontinue EEG.  Preserved the rapid EEG leads in case emergent EEG need arises again. Can consider long-term EEG if he continues to have off-and-on fluctuations in his mentation. Given history of atrial fibrillation-consider repeat MRI as well but I do not see a high yield for that at this time.  That said, strokes are always in the realm of possibility with atrial fibrillation/atypical atrial flutter.  He is currently anticoagulated and not a candidate for IV thrombolysis. Check B12, TSH. Minimize sedating medications-we will defer to primary team.  Plan relayed to Dr. Tacy Learn.  -- Amie Portland, MD Neurologist [facility] Pager: [phone]  CRITICAL CARE ATTESTATION Performed by: Amie Portland, MD Total critical care time: 60 minutes Critical care time was exclusive of separately billable procedures and treating other patients and/or supervising APPs/Residents/Students Critical care was necessary to treat or prevent imminent or life-threatening deterioration due to multifactorial toxic  metabolic encephalopathy This patient is critically ill and at significant risk for neurological worsening and/or death and care requires constant monitoring. Critical care was time spent personally by me on the following activities: development of treatment plan with patient and/or surrogate as well as nursing, discussions with consultants, evaluation of patient's response to treatment, examination of patient, obtaining history from patient or surrogate, ordering and performing treatments and interventions, ordering and review  of laboratory studies, ordering and review of radiographic studies, pulse oximetry, re-evaluation of patient's condition, participation in multidisciplinary rounds and medical decision making of high complexity in the care of this patient.

## 2021-04-07 NOTE — Progress Notes (Signed)
[facility] Progress Note   Assessment/ Plan:   Outpatient dialysis prescription: [facility] [phone] 4 Hours, 180 dialyzer, 2K/2.5 Ca, DFR 500, BFR 400 Use AVF; graft on inner arm, 15 gauge Heparin loading dose 2000 and hourly dose of 1200 EDW 78.5 kg Last post weight 78 kg on 5/21 (went in at 78 and came out at 78 kg) Meds: epogen 2400 units each tx venofer 50 mg weekly Not on hectorol or calcitriol sensipar 30 mg three times a week with HD   Assessment/Plan:   Septic shock- on broad spectrum abx- Flagyl, cefepime.  Eraxis and flagyl stopped 04/05/21.  ID following.  Pressors per PCCM Acute hypoxic RF: intubated per PCCM--> for extubation today ESRD - normally on TTS dialysis in [place], last  outpatient HD 5/30 now on CRRT since 04/05/2021 following CABG complicated by PEA arrest and need for pressors.   - CRRT 04/21/2021-03/30/21 and temp HD catheter removed at that time.       - CRRT resumed 04/02/21 due to acute decompensation and profound hypotension, has L femoral temporary HD cath       - increase UF rate to 100-150 mL/ hr net neg today      - Please note that this pt is ESRD and PICC/ midline is not appropriate  CAD s/p CABG x 4 complicated by PEA arrest.  Acute on chronic CHF with cardiogenic shock post CABG - on midodrine 15 mg tid.  Repeat ECHO EF 45-50%, RV function moderately reduced. Pulmonary HTN - per HF team Cirrhosis - presumably cardiogenic.  S/p paracentesis on 03/18/21. 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  Hypophosphatemia - repleted with IV phos and follow. Afib/ flutter: on systemic heparin gtt and amiodarone Dispo: ICU  Subjective:    For extubation today.  Continues on CRRT, levophed down to 8   Objective:   BP (!) 87/46   Pulse 97   Temp 99.3 F (37.4 C) (Oral)   Resp 16   Ht 6' 2" (1.88 m)   Wt 72.4 kg   SpO2 97%   BMI 20.49 kg/m   Physical Exam: Gen: ill appearing, cachectic,  intubated, awake and following commands CVS: irregular, tachycardic Resp: coarse mechanical bilaterally Abd: soft Ext: sarcopenic, dependent thigh edema ACCESS: L nontunneled femoral HD catheter, R UE AVF + T/B  Labs: BMET Recent Labs  Lab 04/03/21 1550 04/04/21 0123 04/04/21 1530 04/05/21 0401 04/05/21 1107 04/05/21 1536 04/06/21 0055 04/06/21 2054 04/07/21 0129 04/07/21 0244  NA 134*   < > 137 136 138 136 136 135 135 135  K 4.7   < > 4.1 4.1 3.9 4.0 3.8 3.6 4.2 3.7  CL 101   < > 104 102  --  101 106 102 100 104  CO2 22   < > 24 23  --  _0 GLUCOSE 97   < > 100* 149*  --  134* 126* 163* 145* 172*  BUN 26*   < > 16 15  --  _1 CREATININE 2.14*   < > 1.56* 1.39*  --  1.25* 1.25* 1.49* 1.39* 1.44*  CALCIUM 7.4*   < > 7.9* 7.8*  --  8.0* 7.0* 7.9* 8.0* 7.7*  PHOS 2.4*  2.5  --  1.8* 1.4*  --  1.1* 2.2* 1.7* 1.5*  --    < > = values in this interval not displayed.   CBC  Recent Labs  Lab 04/01/21 1328 04/01/21 1526 04/03/21 0021 04/04/21 0123 04/05/21 0401 04/05/21 1107 04/06/21 0055 04/06/21 2054 04/07/21 0244  WBC 28.1*   < > 82.3* 71.3* 44.8*  --  37.6* 29.4* 27.3*  NEUTROABS 21.1*  --  75.2* 64.6*  --   --   --   --   --   HGB 9.9*   < > 8.8* 9.1* 8.3* 9.5* 7.6* 10.2* 9.3*  HCT 33.2*   < > 26.2* 27.0* 25.1* 28.0* 23.6* 30.6* 28.2*  MCV 100.0   < > 91.3 89.1 91.3  --  93.7 92.4 93.4  PLT 326   < > 256 291 251  --  200 216 195   < > = values in this interval not displayed.      Medications:     amiodarone  200 mg Per Tube BID   aspirin  81 mg Per Tube Daily   atorvastatin  40 mg Per Tube Daily   bisacodyl  10 mg Oral Daily   Or   bisacodyl  10 mg Rectal Daily   chlorhexidine gluconate (MEDLINE KIT)  15 mL Mouth Rinse BID   Chlorhexidine Gluconate Cloth  6 each Topical Daily   darbepoetin (ARANESP) injection - NON-DIALYSIS  60 mcg Subcutaneous Q Thu-1800   docusate  200 mg Per Tube Daily   feeding supplement (PROSource TF)  45 mL  Per Tube QID   insulin aspart  0-24 Units Subcutaneous Q4H   insulin glargine  10 Units Subcutaneous Daily   ipratropium-albuterol  3 mL Nebulization TID   mouth rinse  15 mL Mouth Rinse 10 times per day   midodrine  15 mg Per Tube TID WC   multivitamin  1 tablet Per Tube QHS   pantoprazole sodium  40 mg Per Tube Daily   polyethylene glycol  17 g Per Tube Daily   sodium chloride flush  10-40 mL Intracatheter Q12H   sodium chloride flush  3 mL Intravenous Q12H   Thrombi-Pad  1 each Topical Once      , MD 04/07/2021, 10:41 AM   

## 2021-04-07 NOTE — Progress Notes (Signed)
ANTICOAGULATION CONSULT NOTE  Pharmacy Consult for heparin Indication: atrial flutter  Allergies  Allergen Reactions   Shellfish-Derived Products Anaphylaxis   Wasp Venom Protein Anaphylaxis   Penicillins Other (See Comments)    Childhood reaction    Patient Measurements: Height: '6\' 2"'$  (188 cm) Weight: 72.4 kg (159 lb 9.8 oz) IBW/kg (Calculated) : 82.2 Heparin Dosing Weight: 79kg  Vital Signs: Temp: 96.5 F (35.8 C) (06/15 1609) Temp Source: Axillary (06/15 1609) BP: 77/45 (06/15 1633) Pulse Rate: 88 (06/15 1633)  Labs: Recent Labs    04/06/21 0055 04/06/21 0315 04/06/21 2054 04/07/21 0129 04/07/21 0244 04/07/21 1504  HGB 7.6*  --  10.2*  --  9.3*  --   HCT 23.6*  --  30.6*  --  28.2*  --   PLT 200  --  216  --  195  --   HEPARINUNFRC 0.89* <0.10*  --   --  <0.10* 0.21*  CREATININE 1.25*  --  1.49* 1.39* 1.44* 1.33*    Estimated Creatinine Clearance: 64.3 mL/min (A) (by C-G formula based on SCr of 1.33 mg/dL (H)).   Medical History: Past Medical History:  Diagnosis Date   ESRD (end stage renal disease) ([place])    Hypertension    Peripheral arterial disease ([place])    Type 2 diabetes mellitus ([place])    Assessment: 40 YOF with recent CABG now with postop afib/flutter. HIT level was not actually sent though a result exists in chart, ok to disregard after discussion with team.   Heparin level difficult to obtain due to limited access. 6/11 Foot stick unsuccessful. RN will draw future levels from red CRRT port.   Heparin level 0.21 on heparin infusion at 2600 units/hr (rechecked peripherally, heparin running centrally) - still below goal of 0.3 to 0.5 so will increase. Hgb 9.3 (s/p 1 PRBC 6/14), plt 195. AT low at 23 on 6/13 - team aware.   Plan for possible PEG/trach in future so will keep on heparin for now. No PICC can be placed - heparin switched to run centrally and level being drawn peripherally.  Goal of Therapy:  Heparin level 0.3-0.5  units/ml  Monitor  platelets by anticoagulation protocol: Yes   Plan:  Increase IV Heparin to 2800 units/hr.  Recheck level peripherally in 6-8 hours.   Sloan Leiter, PharmD, BCPS, BCCCP Clinical Pharmacist Please refer to [facility] for [place] numbers 04/07/2021 4:50 PM After 10:00 PM, call [place] [phone]

## 2021-04-07 NOTE — Progress Notes (Signed)
      [id number]       [place] [address]             [phone]      Reintubated this evening  BP (!) 149/54   Pulse 98   Temp (!) 96.5 F (35.8 C) (Axillary)   Resp (!) 30   Ht '6\' 2"'$  (1.88 m)   Wt 72.4 kg   SpO2 100%   BMI 20.49 kg/m   Intake/Output Summary (Last 24 hours) at 04/07/2021 2051 Last data filed at 04/07/2021 2000 Gross per 24 hour  Intake 3388.04 ml  Output 4612 ml  Net -1223.96 ml   K= 4.5  Continue current Rx.  Revonda Standard Roxan Hockey, MD [facility] [phone]

## 2021-04-07 NOTE — Progress Notes (Addendum)
Patient ID: David Bradly., male   DOB: 1965/12/07, 55 y.o.   MRN: [id number]      Advanced Heart Failure Rounding Note  PCP-Cardiologist: Carlyle Dolly, MD    Patient Profile   55 y/o male w/  history of ESRD due to DM and CHF with mid-range EF (LV EF 40-45% with moderately decreased RV systolic function and PASP 99 on 4/22 echo) as well as cirrhosis of uncertain etiology.  Based on low EF and elevated PA pressure, right and left heart cath was done in 5/22.  This showed severe 3VD, high cardiac output with moderate pulmonary hypertension but low PVR.  He is planned for CABG, but admitted pre-op for optimization.    Subjective:    CABG x 4 on 5/31 with LIMA-LAD, SVG-RCA, SVG-OM, SVG-D.   Coagulopathic post-op with multiple blood products.  Extubated post-op but developed respiratory distress => PEA arrest then VT. Reintubated.  ROSC with ACLS.  Returned to OR for mediastinal exploration 6/1.  Extubated 6/2.   6/6 Midodrine started.   6/9 AMS--> CT negative for acute bleed or ischemic CVA. Intubated. Suspected septic shock.  CTA chest showed no PE, moderate bilateral pleural effusions. Re-intubated and CVVH restarted. CT abdomen/pelvis: anasarca, bilateral pleural effusions and lung base opacities, thickened colon/terminal ileum (?due to cirrhosis, ?ileocolitis).   Remains intubated. Awake on vent. Looks better today.   On Norepi 8. Off VP.   Wt at baseline. CVVH UF rate 0-50 cc/ hr   Finished  Flagyl and Eraxis. Remains on cefepime for HCAP/septic shock.  WBCs 88 => 82 => 71 => 45=>37=>27K.  AF. ID following.   On heparin gtt and amiodarone for AFL. HR 90s-low 100s    Got 1U PRBCs yesterday, Hgb 7.6>>9.3  Echo (6/9) with EF 45-50%, inferolateral severe hypokinesis, moderate RV dysfunction.    Objective:   Weight Range: 72.4 kg Body mass index is 20.49 kg/m.   Vital Signs:   Temp:  [98.1 F (36.7 C)-99.9 F (37.7 C)] 99.3 F (37.4 C) (06/15 0733) Pulse Rate:   [31-118] 88 (06/15 0600) Resp:  [13-28] 25 (06/15 0600) BP: (76-168)/(37-146) 112/50 (06/15 0600) SpO2:  [79 %-100 %] 100 % (06/15 0600) FiO2 (%):  [40 %] 40 % (06/15 0335) Weight:  [72.4 kg] 72.4 kg (06/15 0438) Last BM Date: 04/06/21  Weight change: Filed Weights   04/02/21 0452 04/06/21 0442 04/07/21 0438  Weight: 75.7 kg 72.6 kg 72.4 kg    Intake/Output:   Intake/Output Summary (Last 24 hours) at 04/07/2021 0737 Last data filed at 04/07/2021 0700 Gross per 24 hour  Intake 4069.66 ml  Output 3574 ml  Net 495.66 ml      Physical Exam   General:  thin WM, intubated, awake on vent . No distress  HEENT: +ETT  Neck: supple. JVD 7 cm. Carotids 2+ bilat; no bruits. No lymphadenopathy or thyromegaly appreciated. Cor: PMI nondisplaced. Irregular rhythm, mildly tachy rate. No rubs, gallops or murmurs. Lungs: intubated and clear  Abdomen: soft, nontender, nondistended. No hepatosplenomegaly. No bruits or masses. Good bowel sounds. Extremities: no cyanosis, clubbing, rash, edema + bilateral SCDs  Neuro: intubated and awake on vent. moves all 4 extremities w/o difficulty.   Telemetry   atypical flutter 90s-low 100s   Labs    CBC Recent Labs    04/06/21 2054 04/07/21 0244  WBC 29.4* 27.3*  HGB 10.2* 9.3*  HCT 30.6* 28.2*  MCV 92.4 93.4  PLT 216 953   Basic Metabolic Panel Recent Labs  04/06/21 0055 04/06/21 2054 04/07/21 0129 04/07/21 0244  NA 136 135 135 135  K 3.8 3.6 4.2 3.7  CL 106 102 100 104  CO2 '22 22 22 24  ' GLUCOSE 126* 163* 145* 172*  BUN '12 19 19 18  ' CREATININE 1.25* 1.49* 1.39* 1.44*  CALCIUM 7.0* 7.9* 8.0* 7.7*  MG 2.0  --   --  2.1  PHOS 2.2* 1.7* 1.5*  --    Liver Function Tests Recent Labs    04/06/21 2054 04/07/21 0129  ALBUMIN 2.0* 1.9*   No results for input(s): LIPASE, AMYLASE in the last 72 hours. Cardiac Enzymes No results for input(s): CKTOTAL, CKMB, CKMBINDEX, TROPONINI in the last 72 hours.  BNP: BNP (last 3  results) Recent Labs    03/18/2021 1545  BNP 1,127.8*    ProBNP (last 3 results) No results for input(s): PROBNP in the last 8760 hours.   D-Dimer No results for input(s): DDIMER in the last 72 hours.  Hemoglobin A1C No results for input(s): HGBA1C in the last 72 hours. Fasting Lipid Panel No results for input(s): CHOL, HDL, LDLCALC, TRIG, CHOLHDL, LDLDIRECT in the last 72 hours. Thyroid Function Tests No results for input(s): TSH, T4TOTAL, T3FREE, THYROIDAB in the last 72 hours.  Invalid input(s): FREET3  Other results:   Imaging    MR BRAIN WO CONTRAST  Result Date: 04/06/2021 CLINICAL DATA:  Acute neurologic deficit. EXAM: MRI HEAD WITHOUT CONTRAST TECHNIQUE: Multiplanar, multiecho pulse sequences of the brain and surrounding structures were obtained without intravenous contrast. COMPARISON:  None. FINDINGS: Brain: No acute infarct, mass effect or extra-axial collection. No acute or chronic hemorrhage. Minimal multifocal hyperintense T2-weight signal within the white matter. 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 The midline structures are normal. Vascular: Major flow voids are preserved. Skull and upper cervical spine: Normal calvarium and skull base. Visualized upper cervical spine and soft tissues are normal. Sinuses/Orbits:Bilateral mastoid effusions. Fluid in the nasopharynx. Normal orbits. IMPRESSION: 1. No acute intracranial abnormality. 2. 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 3. Bilateral mastoid effusions and fluid in the nasopharynx. Electronically Signed   By: Ulyses Jarred M.D.   On: 04/06/2021 19:30   [place] EKG SITE RITE  Result Date: 04/06/2021 If Site Rite image not attached, placement could not be confirmed due to current cardiac rhythm.     Medications:     Scheduled Medications:  amiodarone  200 mg Per Tube BID   aspirin  81 mg Per Tube Daily   atorvastatin  40 mg Per Tube Daily    bisacodyl  10 mg Oral Daily   Or   bisacodyl  10 mg Rectal Daily   chlorhexidine gluconate (MEDLINE KIT)  15 mL Mouth Rinse BID   Chlorhexidine Gluconate Cloth  6 each Topical Daily   darbepoetin (ARANESP) injection - NON-DIALYSIS  60 mcg Subcutaneous Q Thu-1800   docusate  200 mg Per Tube Daily   feeding supplement (PROSource TF)  45 mL Per Tube QID   insulin aspart  0-24 Units Subcutaneous Q4H   insulin glargine  10 Units Subcutaneous Daily   ipratropium-albuterol  3 mL Nebulization TID   mouth rinse  15 mL Mouth Rinse 10 times per day   midodrine  15 mg Per Tube TID WC   multivitamin  1 tablet Per Tube QHS   pantoprazole sodium  40 mg Per Tube Daily   polyethylene glycol  17 g Per Tube Daily   sodium chloride flush  10-40  mL Intracatheter Q12H   sodium chloride flush  3 mL Intravenous Q12H   Thrombi-Pad  1 each Topical Once    Infusions:   prismasol BGK 4/2.5 500 mL/hr at 04/06/21 1706    prismasol BGK 4/2.5 300 mL/hr at 04/06/21 0948   sodium chloride 10 mL/hr at 04/07/21 0700   ceFEPime (MAXIPIME) IV Stopped (04/06/21 2213)   feeding supplement (VITAL 1.5 CAL) 1,000 mL (04/05/21 0800)   fentaNYL infusion INTRAVENOUS 175 mcg/hr (04/07/21 0700)   heparin 2,600 Units/hr (04/07/21 0700)   lactated ringers     lactated ringers Stopped (04/01/21 1902)   norepinephrine (LEVOPHED) Adult infusion 8 mcg/min (04/07/21 0700)   prismasol BGK 4/2.5 1,500 mL/hr at 04/07/21 0649   vasopressin Stopped (04/06/21 0914)    PRN Medications: heparin, lactated ringers, lidocaine (PF), lip balm, metoprolol tartrate, morphine injection, ondansetron (ZOFRAN) IV, oxyCODONE, sodium chloride, sodium chloride flush     Assessment/Plan   1. CAD: Severe 3VD with decreased EF.  I reviewed films with Dr. Ellyn Hack, PCI would be possible but would be difficult/high risk with multiple lesions and heavy calcification. 5/31 LIMA-LAD, SVG-RCA, SVG-OM, SVG-D. No s/s ischemic currently  - ASA 81 +  atorvastatin 40 mg daily.  2. Acute/chronic HF with mid range EF => cardiogenic shock post-CABG: Suspect ischemic cardiomyopathy.  Echo 4/22 with EF 40-45% with moderately decreased RV systolic function and PASP 99, moderate TR. There was a prominent component of RV failure with RA pressure elevated out of proportion to PCWP (CVP/PCWP 0.875 on RHC) but PAPI adequate at 2.8. Post-op had PEA arrest then VT.  On 6/9, episode of altered mental status, progressive hypotension, and intubated.  Suspect septic shock now improving.  Echo (6/9) with EF 45-50%, inferolateral severe hypokinesis, moderate RV dysfunction. On NE 8 mcg. Off VP.  Weight close to baseline.  - CVVH ongoing, aim to keep net even today - Continue to wean pressors as able.  - Continue midodrine 3. ESRD: Suspected due to diabetes.  Volume up post-op with multiple blood products. Now back to baseline.  - CVVH ongoing, will aim to keep net even   - Renal following  4. 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  The CO was not markedly high.  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 Oxygen saturation was 99% on RA pre-op, so no evidence for hepatopulmonary syndrome.   - Sildenafil has been on hold with pressors.  5. Cirrhosis: Noted on abdominal US from 2020.  H/o paracentesis.  Had workup at [facility] (though patient does not remember this), viral hepatitis labs negative.  They ended up think that the cirrhosis was cardiogenic.  Repeat abdominal US 5/23 w/ nodular hepatic parenchymal pattern with increased echogenicity consistent with the patient's known cirrhosis. No focal hepatic abnormality identified. Portal vein is patent. Moderate ascites. GI has seen, patient had 4L paracentesis on 5/26. - He may eventually need repeat paracentesis.  6. Coagulopathy: Post-op bleeding in setting of coagulopathy post-CABG in patient with  cirrhosis.  Currently seems controlled.  Platelets dropped then recovered, doubt HIT.  7. VT: Had after PEA arrest am 6/1.  - Continue amiodarone 200 bid per tube.  8. Anemia: ABLA.   - transfused x 1 u PRBC 6/14. Hgb 7.6=>9.3 9. Pleural effusions: ?hepatic hydrothorax.    - Left effusion on last CXR, ID recommend thoracentesis.     10. PEA arrest: Respiratory arrest post-extubation.  ROSC with CPR.  11. Acute hypoxemic respiratory failure:  Extubated 6/2, re-intubated 6/9. Self-extubated overnight 6/12 and re-intubated.  - Stable on vent, per CCM.  12. Atrial fibrillation: Atypical atrial flutter currently.   - Continue amiodarone per tube.  - On heparin gtt - Eventual DCCV.  13. ID: Cefepime/Flagyl/Eraxis.   Suspected HCAP with septic shock.  WBC 88=>85=>41=>29=>27K. Now afebrile. Cultures with Staph epidermidis 1/2, suspect contaminant.  - ID following, continue broad spectrum abx. Finished Eraxis 14. Neuro: Altered mental status 6/9 with intubation.  CT head with no bleed or ischemic CVA seen.  EEG with diffuse encephalopathy.  Currently awake on vent and following commands.   Lyda Jester, PA-C  04/07/2021 7:37 AM  Patient seen with PA, agree with the above note.   Awake and alert on vent this morning, NE down to 8 and off vasopressin. CVVH running even, weight stable.   ECG yesterday shows ongoing atypical flutter.   General: NAD Neck: JVP 8-9 cm, no thyromegaly or thyroid nodule.  Lungs: Clear to auscultation bilaterally with normal respiratory effort. CV: Nondisplaced PMI.  Heart regular S1/S2, no S3/S4, no murmur.  No peripheral edema.  Abdomen: Soft, nontender, no hepatosplenomegaly, no distention.  Skin: Intact without lesions or rashes.  Neurologic: Alert on vent. Extremities: No clubbing or cyanosis.  HEENT: Normal.   Continue to wean down NE as able.  Ongoing CVVH, can continue to run even.  Continue midodrine, hopefully to iHD again soon.   Much more alert  this morning, hopefully will be able to extubate and avoid trach.   Continue cefepime.   CRITICAL CARE Performed by: Loralie Champagne  Total critical care time: 35 minutes  Critical care time was exclusive of separately billable procedures and treating other patients.  Critical care was necessary to treat or prevent imminent or life-threatening deterioration.  Critical care was time spent personally by me on the following activities: development of treatment plan with patient and/or surrogate as well as nursing, discussions with consultants, evaluation of patient's response to treatment, examination of patient, obtaining history from patient or surrogate, ordering and performing treatments and interventions, ordering and review of laboratory studies, ordering and review of radiographic studies, pulse oximetry and re-evaluation of patient's condition.  Loralie Champagne 04/07/2021 8:06 AM

## 2021-04-07 NOTE — Progress Notes (Signed)
Neurology Consultation  Reason for Consult: Altered mental status Referring Physician: Dr. Tacy Learn PCCM  CC: Altered mental status  History is obtained from: Chart review  HPI: David Rose. is a 55 y.o. male with medical history of ESRD, hypertension, coronary artery disease, peripheral artery disease, type 2 diabetes, cirrhosis who presented with symptomatic ischemic cardiomyopathy and underwent perioperative optimization of CHF GI and nephrology teams with eventual CABG on 03/09/2021.  On June 1 he developed respiratory distress followed by PEA arrest requiring CPR.  Code was canceled because of increased bleeding around surgical sites prompting mediastinal exploration on March 24, 2021 with intraoperative findings notable for bleeding around mammary graft site and diffuse oozing for which patient received multiple blood products.  He was intubated and on pressors postoperatively and was being cared for by multiple teams including nephrology, and critical care along with cardiology and cardiothoracic surgery. He was doing well, following commands with a last known well of sometime around 6 PM today after which she became acutely unresponsive and required emergent intubation for airway protection. He has had similar episode of unresponsiveness in the past during this admission as well.  That was felt to be due to multiple toxic metabolic derangements including hypoxic respiratory failure, some component of hypoxic ischemic encephalopathy, AKI, and mixed cardiogenic shock.  Course also complicated by atrial fibrillation-currently in atypical atrial flutter.  On amiodarone per tube and heparin gtt. Today, due to unclear etiology of his Vernie Murders was concern for possible underlying seizures for which neurological consultation was obtained. Stat EEG was also requested. He had an MRI of the brain on 04/06/2021- No acute abnormalities, chronic small vessel infarcts within the centrum semiovale  bilaterally and bilateral mastoid effusions and fluid in the nasopharynx.   LKW: 6 PM today tpa given?: no, nonfocal exam Premorbid modified Rankin scale (mRS): 5  ROS: Unable to obtain due to altered mental status.   Past Medical History:  Diagnosis Date   ESRD (end stage renal disease) ([place])    Hypertension    Peripheral arterial disease ([place])    Type 2 diabetes mellitus ([place])    Family History  Problem Relation Age of Onset   Thyroid cancer Mother    Leukemia Father      Social History:   reports that he has never smoked. He has never used smokeless tobacco. No history on file for alcohol use and drug use.  Medications  Current Facility-Administered Medications:     prismasol BGK 4/2.5 infusion, , CRRT, Continuous, Coladonato, Joseph, MD, Last Rate: 500 mL/hr at 04/07/21 1727, New Bag at 04/07/21 1727    prismasol BGK 4/2.5 infusion, , CRRT, Continuous, Coladonato, Broadus John, MD, Last Rate: 300 mL/hr at 04/07/21 1002, New Bag at 04/07/21 1002   0.9 %  sodium chloride infusion, , Intravenous, Continuous, Icard, Bradley L, DO, Stopped at 04/07/21 0851   amiodarone (PACERONE) tablet 200 mg, 200 mg, Per Tube, BID, Larey Dresser, MD, 200 mg at 04/07/21 0919   aspirin chewable tablet 81 mg, 81 mg, Per Tube, Daily, Wonda Olds, MD, 81 mg at 04/07/21 0919   atorvastatin (LIPITOR) tablet 40 mg, 40 mg, Per Tube, Daily, Atkins, Glenice Bow, MD, 40 mg at 04/07/21 0919   bisacodyl (DULCOLAX) EC tablet 10 mg, 10 mg, Oral, Daily, 10 mg at 04/07/21 0919 **OR** bisacodyl (DULCOLAX) suppository 10 mg, 10 mg, Rectal, Daily, Gold, Wayne E, PA-C, 10 mg at 04/05/2021 1351   ceFEPIme (MAXIPIME) 2 g in sodium chloride 0.9 %  100 mL IVPB, 2 g, Intravenous, Q12H, Comer, Okey Regal, MD, Stopped at 04/07/21 1039   chlorhexidine gluconate (MEDLINE KIT) (PERIDEX) 0.12 % solution 15 mL, 15 mL, Mouth Rinse, BID, Clark, Laura P, DO, 15 mL at 04/07/21 0753   Chlorhexidine Gluconate Cloth 2 % PADS 6 each, 6  each, Topical, Daily, Wonda Olds, MD, 6 each at 04/07/21 0919   Darbepoetin Alfa (ARANESP) injection 60 mcg, 60 mcg, Subcutaneous, Q Thu-1800, Elmarie Shiley, MD   dexmedetomidine (PRECEDEX) 400 MCG/100ML (4 mcg/mL) infusion, 0-1.2 mcg/kg/hr, Intravenous, Continuous, Estill Cotta, NP   docusate (COLACE) 50 MG/5ML liquid 100 mg, 100 mg, Per Tube, BID, Estill Cotta, NP   feeding supplement (PROSource TF) liquid 45 mL, 45 mL, Per Tube, QID, Icard, Bradley L, DO, 45 mL at 04/07/21 1922   feeding supplement (VITAL 1.5 CAL) liquid 1,000 mL, 1,000 mL, Per Tube, Continuous, Atkins, Glenice Bow, MD, Last Rate: 65 mL/hr at 04/07/21 1446, 1,000 mL at 04/07/21 1446   fentaNYL (SUBLIMAZE) injection 25-50 mcg, 25-50 mcg, Intravenous, Q15 min PRN, Estill Cotta, NP   fentaNYL (SUBLIMAZE) injection 25-50 mcg, 25-50 mcg, Intravenous, Q30 min PRN, Estill Cotta, NP   fentaNYL 251mg in NS 256m(1016mml) infusion-PREMIX, 0-400 mcg/hr, Intravenous, Continuous, Icard, Bradley L, DO, Stopped at 04/07/21 0945   heparin ADULT infusion 100 units/mL (25000 units/250m9m2,800 Units/hr, Intravenous, Continuous, MillPriscella MannH, Last Rate: 28 mL/hr at 04/07/21 1952, 2,800 Units/hr at 04/07/21 1952   heparin injection 1,000-6,000 Units, 1,000-6,000 Units, CRRT, PRN, ColaDonato Heinz   insulin aspart (novoLOG) injection 0-24 Units, 0-24 Units, Subcutaneous, Q4H, Atkins, BroaGlenice Bow, 2 Units at 04/07/21 1727   insulin glargine (LANTUS) injection 10 Units, 10 Units, Subcutaneous, Daily, ClarJulian Hy, 10 Units at 04/07/21 0919   ipratropium-albuterol (DUONEB) 0.5-2.5 (3) MG/3ML nebulizer solution 3 mL, 3 mL, Nebulization, TID, Atkins, Broadus Z, MD, 3 mL at 04/07/21 2000   lactated ringers infusion 500 mL, 500 mL, Intravenous, Once PRN, Gold, Wayne E, PA-C   lactated ringers infusion, , Intravenous, Continuous, Gold, Wayne E, PA-C, Stopped at 04/01/21 1902   lidocaine (PF) (XYLOCAINE) 1 %  injection, , , PRN, Bruning, Kevin, PA-C, 10 mL at 03/18/21 1500   lip balm (CARMEX) ointment, , Topical, PRN, AtkiWonda Olds, Given at 03/25/21 2247   MEDLINE mouth rinse, 15 mL, Mouth Rinse, 10 times per day, ClarNoemi ChapelDO, 15 mL at 04/07/21 1731   metoprolol tartrate (LOPRESSOR) injection 2.5-5 mg, 2.5-5 mg, Intravenous, Q2H PRN, Gold, Wayne E, PA-C   midodrine (PROAMATINE) tablet 15 mg, 15 mg, Per Tube, TID WC, McLeLarey Dresser, 15 mg at 04/07/21 1639   morphine 2 MG/ML injection 1-4 mg, 1-4 mg, Intravenous, Q1H PRN, Gold, Wayne E, PA-C, 4 mg at 04/02/21 [id number] (RENA-VIT) tablet 1 tablet, 1 tablet, Per Tube, QHS, AtkiWonda Olds, 1 tablet at 04/06/21 2123   norepinephrine (LEVOPHED) 16 mg in 250mL56mmix infusion, 0-40 mcg/min, Intravenous, Titrated, Atkins, BroadGlenice Bow Last Rate: 9.38 mL/hr at 04/07/21 1800, 10 mcg/min at 04/07/21 1800   ondansetron (ZOFRAN) injection 4 mg, 4 mg, Intravenous, Q6H PRN, Gold, Wayne E, PA-C, 4 mg at 03/31/21 0212 7353yCODONE (ROXICODONE) 5 MG/5ML solution 5-10 mg, 5-10 mg, Per Tube, Q3H PRN, Atkins, BroadGlenice Bow  pantoprazole sodium (PROTONIX) 40 mg/20 mL oral suspension 40 mg, 40 mg, Per Tube, Daily, Icard, Bradley L, DO, 40 mg at  04/07/21 0919   polyethylene glycol (MIRALAX / GLYCOLAX) packet 17 g, 17 g, Per Tube, Daily, Orvan Seen, Glenice Bow, MD, 17 g at 04/07/21 9892   prismasol BGK 4/2.5 infusion, , CRRT, Continuous, Donato Heinz, MD, Last Rate: 1,500 mL/hr at 04/07/21 1639, New Bag at 04/07/21 1639   sodium chloride 0.9 % primer fluid for CRRT, , CRRT, PRN, Donato Heinz, MD   sodium chloride flush (NS) 0.9 % injection 10-40 mL, 10-40 mL, Intracatheter, Q12H, Orvan Seen, Glenice Bow, MD, 10 mL at 04/07/21 0920   sodium chloride flush (NS) 0.9 % injection 3 mL, 3 mL, Intravenous, Q12H, Gold, Wayne E, PA-C, 3 mL at 04/07/21 0920   sodium chloride flush (NS) 0.9 % injection 3 mL, 3 mL, Intravenous, PRN, Gold, Wayne E,  PA-C   Thrombi-Pad 3"X3" pad 1 each, 1 each, Topical, Once, Bowser, Laurel Dimmer, NP   vasopressin (PITRESSIN) 20 Units in sodium chloride 0.9 % 100 mL infusion-*FOR SHOCK*, 0.03 Units/min, Intravenous, Continuous, Estill Cotta, NP, Last Rate: 9 mL/hr at 04/07/21 1905, 0.03 Units/min at 04/07/21 1905  Exam: Current vital signs: BP (!) 149/54   Pulse 98   Temp (!) 96.5 F (35.8 C) (Axillary)   Resp (!) 30   Ht '6\' 2"'  (1.88 m)   Wt 72.4 kg   SpO2 100%   BMI 20.49 kg/m  Vital signs in last 24 hours: Temp:  [96.5 F (35.8 C)-99.3 F (37.4 C)] 96.5 F (35.8 C) (06/15 1609) Pulse Rate:  [45-118] 98 (06/15 2000) Resp:  [16-30] 30 (06/15 2000) BP: (61-168)/(26-146) 149/54 (06/15 1900) SpO2:  [96 %-100 %] 100 % (06/15 2000) FiO2 (%):  [40 %-100 %] 100 % (06/15 2000) Weight:  [72.4 kg] 72.4 kg (06/15 0438) Initially extremely unresponsive but later on exam as below: General: He is awake, alert, in some distress due to the ET tube HEENT: Normocephalic atraumatic, bitemporal muscle atrophy noted. CVS: Regular rate rhythm Respiratory: Vented Extremities warm well perfused without edema Neurological exam Is awake, alert, intubated.  On no sedation Is able to follow simple commands and lift his arms to command. Pupils are equal round reactive to light.  Extraocular movements unrestricted. Facial symmetry difficult to ascertain due to the tube Both lower extremities have equal movement but he was not able to raise them against gravity.  Both upper extremities he was able to raise against gravity for a few seconds. Sensation intact to touch. Difficult assess coordination and gait given his mentation, intubated status. NIH stroke scale 1a Level of Conscious.: 0 1b LOC Questions: 2 1c LOC Commands: 0 2 Best Gaze: 0 3 Visual: 0 4 Facial Palsy: 0 5a Motor Arm - left: 0 5b Motor Arm - Right: 0 6a Motor Leg - Left: 1 6b Motor Leg - Right: 1 7 Limb Ataxia: 0 8 Sensory: 0 9 Best Language:  3 10 Dysarthria: un (intubated) 11 Extinct. and Inatten.: 0 TOTAL: 7   Labs I have reviewed labs in epic and the results pertinent to this consultation are: WBC count of 27.3, anemia with hemoglobin 9.3, hematocrit 28.2, glucose 172, normal sodium and potassium, creatinine 1.33. ABG was just done a few minutes ago which shows a pH of 7.406, PO2 326, PCO2 42.6, bicarb 26.9  CBC    Component Value Date/Time   WBC 27.3 (H) 04/07/2021 0244   RBC 3.02 (L) 04/07/2021 0244   HGB 11.9 (L) 04/07/2021 2002   HCT 35.0 (L) 04/07/2021 2002   PLT 195 04/07/2021 0244   MCV  93.4 04/07/2021 0244   MCH 30.8 04/07/2021 0244   MCHC 33.0 04/07/2021 0244   RDW 21.6 (H) 04/07/2021 0244   LYMPHSABS 1.3 04/04/2021 0123   MONOABS 1.9 (H) 04/04/2021 0123   EOSABS 0.5 04/04/2021 0123   BASOSABS 0.3 (H) 04/04/2021 0123    CMP     Component Value Date/Time   NA 135 04/07/2021 2002   K 4.5 04/07/2021 2002   CL 101 04/07/2021 1504   CO2 27 04/07/2021 1504   GLUCOSE 124 (H) 04/07/2021 1504   BUN 18 04/07/2021 1504   CREATININE 1.33 (H) 04/07/2021 1504   CALCIUM 7.7 (L) 04/07/2021 1504   PROT 5.7 (L) 04/04/2021 0123   ALBUMIN 2.0 (L) 04/07/2021 1504   AST 40 04/04/2021 0123   ALT 24 04/04/2021 0123   ALKPHOS 198 (H) 04/04/2021 0123   BILITOT 2.1 (H) 04/04/2021 0123   GFRNONAA >60 04/07/2021 1504   Imaging I have reviewed the images obtained:  MRI examination of the brain-04/06/2021-no acute findings.  Stat EEG: I applied the Ceribell rapid EEG leads. Preliminary read negative for seizures Official reading: IMPRESSION: This study is suggestive of moderate to severe diffuse encephalopathy, nonspecific etiology. No seizures or epileptiform discharges were seen throughout the recording.    Assessment: 55 year old with past medical history of ESRD, hypertension, coronary artery disease, peripheral artery disease, type 2 diabetes, cirrhosis, status post CABG, with a complicated hospital course due  to renal failure, respiratory distress and PEA arrest, along with atrial fibrillation-today with sudden onset of unresponsiveness. Concern for seizure/status epilepticus. Stat EEG negative for seizures.  Slowing indicative of moderate to severe diffuse encephalopathy was noted. By the time I repeatedly examined him, his exam was reassuring and nonfocal. Likely toxic metabolic encephalopathy in the setting of multiple toxic metabolic derangements. Given history of cirrhosis, I would also check ammonia levels If continues to revaccinating course, might benefit from continuous EEG.  Repeat imaging might be beneficial if he has any focal findings as he wakens up more-given atrial fibrillation Not a candidate for tPA due to being on anticoagulation currently with heparin.  Impression Multifactorial toxic metabolic encephalopathy  Recommendations: Check ammonia levels No need for continuous EEG-discontinue EEG.  Preserved the rapid EEG leads in case emergent EEG need arises again. Can consider long-term EEG if he continues to have off-and-on fluctuations in his mentation. Given history of atrial fibrillation-consider repeat MRI as well but I do not see a high yield for that at this time.  That said, strokes are always in the realm of possibility with atrial fibrillation/atypical atrial flutter.  He is currently anticoagulated and not a candidate for IV thrombolysis. Check B12, TSH. Minimize sedating medications-we will defer to primary team.  Plan relayed to Dr. Tacy Learn.  -- Amie Portland, MD Neurologist [facility] Pager: [phone]  CRITICAL CARE ATTESTATION Performed by: Amie Portland, MD Total critical care time: 60 minutes Critical care time was exclusive of separately billable procedures and treating other patients and/or supervising APPs/Residents/Students Critical care was necessary to treat or prevent imminent or life-threatening deterioration due to multifactorial toxic  metabolic encephalopathy This patient is critically ill and at significant risk for neurological worsening and/or death and care requires constant monitoring. Critical care was time spent personally by me on the following activities: development of treatment plan with patient and/or surrogate as well as nursing, discussions with consultants, evaluation of patient's response to treatment, examination of patient, obtaining history from patient or surrogate, ordering and performing treatments and interventions, ordering and review  of laboratory studies, ordering and review of radiographic studies, pulse oximetry, re-evaluation of patient's condition, participation in multidisciplinary rounds and medical decision making of high complexity in the care of this patient.

## 2021-04-07 NOTE — Plan of Care (Signed)
Called by Dr. Tacy Learn regarding stat EEG with the patient with concern for rule out status epilepticus. Sudden onset of unresponsiveness with no clear etiology. Ceribell lead applied. Negative for seizures. Shows slowing. Formal read in the AM by Dr. Hortense Ramal.   -- David Portland, MD Neurologist [facility] Pager: [phone]

## 2021-04-07 NOTE — Procedures (Addendum)
Patient Name: David Rose.  MRN: [id number]  Epilepsy Attending: Lora Havens  Referring Physician/Provider: Hillery Aldo, NP Date: 04/07/2021 Duration: 44 mins  Patient history: 55 yo M with sudden onset of unresponsiveness with no clear etiology. EEG to evaluate for status epilepticus.   Level of alertness:  lethargic   AEDs during EEG study: None  Technical aspects: This EEG was obtained using a 10 lead EEG system positioned circumferentially without any parasagittal coverage (rapid EEG). Computer selected EEG is reviewed as well as background features and all clinically significant events.  Description: EEG showed continuous generalized low amplitude 3 to 6 Hz theta-delta slowing.Hyperventilation and photic stimulation were not performed.     ABNORMALITY - Continuous slow, generalized  IMPRESSION: This study is suggestive of moderate to severe diffuse encephalopathy, nonspecific etiology. No seizures or epileptiform discharges were seen throughout the recording.  Dr Rory Percy was notified.   Josphine Laffey Barbra Sarks

## 2021-04-07 NOTE — Procedures (Signed)
Extubation Procedure Note  Patient Details:   Name: David Rose. DOB: 12/14/1965 MRN: [id number]   Airway Documentation:    Vent end date: 04/07/21 Vent end time: 0940   Evaluation  O2 sats: stable throughout Complications: No apparent complications Patient did tolerate procedure well. Bilateral Breath Sounds: Clear, Diminished   Yes  Pt extubated per MD order. Positive cuff leak. Pt noted to have a weak cough and gag. RN came down further on sedation. Pt is currently sitting upright and able to speak. CPT started to help with lung therapy and wake pt up better. Pt able to voice complaints of CPT. RRT will continue to monitor pt closely for any signs of distress.  Esperanza Sheets T 04/07/2021, 9:48 AM

## 2021-04-07 NOTE — Progress Notes (Signed)
Nutrition Follow-up  DOCUMENTATION CODES:   Severe malnutrition in context of chronic illness  INTERVENTION:   Recommend continuing Cortrak with TF even once diet advanced until pt able to demonstrate adequate po intake; discussed with RN  Tube Feeding via Cortrak:  Vital 1.5 at 65 ml/hr Pro-Source TF 45 mL QID Provides 149 g of protein, 2500 kcals and 1186 mL of free water   NUTRITION DIAGNOSIS:   Severe Malnutrition related to chronic illness (CAD/CHF/ESRD) as evidenced by severe fat depletion, severe muscle depletion.  Being addressed via TF   GOAL:   Patient will meet greater than or equal to 90% of their needs  Met via nutrition support  MONITOR:   TF tolerance, Diet advancement, Labs, Weight trends, Skin  REASON FOR ASSESSMENT:   Consult Enteral/tube feeding initiation and management  ASSESSMENT:   Patient with PMH significant for DM, HTN, PAD, CHF, CAD, ESRD, cirrhosis, and IBS. Presents this admission for CABG.  5/22- s/p RHC, severe 3vd, high cardiac output 5/26- paracentesis- drained 4L 5/31- s/p CABG 6/01- code, hemorrhagic shock, back to OR for mediastinal exploration, Cortrak placed 6/02- extubated, trickle TF initiated 6/03- TF advanced to goal 6/05- Cortrak pulled per provider  6/07- CRRT discontinued, femoral HD cath removed 6/09- Re-intubated 6/10- Cortrak placed 6/15- Extubated  Extubated this AM, currently on HFNC, remains on CRRT  NPO Tolerating Vital 1.5 TF at 60 ml/hr goal rate today via Cortrak, TF was restarted at 20 ml/hr on 6/13 as was slowly being titrated to goal. TF held 6/10 for CT abdomen/pelvis. Pro-Source TF 45 mL QID via Cortrak  Outpatient EDW 78.5 kg; current wt 72.4 kg  Labs: phosphorus 1.5 (H), CBGs 123-205 Meds: aranesp, colace, ss novolog, lantus, rena-vite, sodium phosphate   Diet Order:   Diet Order     None       EDUCATION NEEDS:   Not appropriate for education at this time  Skin:  Skin Assessment:  Skin Integrity Issues: Skin Integrity Issues:: Incisions, DTI DTI: sacrum Incisions: R leg, chest  Last BM:  6/14  Height:   Ht Readings from Last 1 Encounters:  03/10/2021 6' 2" (1.88 m)    Weight:   Wt Readings from Last 1 Encounters:  04/07/21 72.4 kg     BMI:  Body mass index is 20.49 kg/m.  Estimated Nutritional Needs:   Kcal:  2200-2500 kcals  Protein:  125-155 grams  Fluid:  >/= 2 L/day   Kerman Passey MS, RDN, LDN, CNSC Registered Dietitian III Clinical Nutrition RD Pager and On-Call Pager Number Located in [place]

## 2021-04-07 NOTE — Progress Notes (Signed)
14 Days Post-Op Procedure(s) (LRB): EXPLORATION POST OPERATIVE OPEN HEART (N/A) Subjective: No complaints  Objective: Vital signs in last 24 hours: Temp:  [98.1 F (36.7 C)-99.9 F (37.7 C)] 98.5 F (36.9 C) (06/15 1110) Pulse Rate:  [31-118] 94 (06/15 1420) Cardiac Rhythm: Normal sinus rhythm (06/15 1200) Resp:  [16-28] 19 (06/15 1420) BP: (61-168)/(26-146) 95/81 (06/15 1200) SpO2:  [79 %-100 %] 97 % (06/15 1420) FiO2 (%):  [40 %] 40 % (06/15 0825) Weight:  [72.4 kg] 72.4 kg (06/15 0438)  Hemodynamic parameters for last 24 hours:    Intake/Output from previous day: 06/14 0701 - 06/15 0700 In: 4069.7 [P.O.:530; I.V.:1142.9; Blood:315; VQ:[id number]; IV Piggyback:436.8] Out: 3574  Intake/Output this shift: Total I/O In: 1486.9 [I.V.:303.7; NG/GT:870; IV Piggyback:313.2] Out: 2159 [Other:2159]  General appearance: no distress Neurologic: intact Heart: tachy Extremities: no edema Wound: c/d/i  Lab Results: Recent Labs    04/06/21 2054 04/07/21 0244  WBC 29.4* 27.3*  HGB 10.2* 9.3*  HCT 30.6* 28.2*  PLT 216 195   BMET:  Recent Labs    04/07/21 0129 04/07/21 0244  NA 135 135  K 4.2 3.7  CL 100 104  CO2 22 24  GLUCOSE 145* 172*  BUN 19 18  CREATININE 1.39* 1.44*  CALCIUM 8.0* 7.7*    PT/INR: No results for input(s): LABPROT, INR in the last 72 hours. ABG    Component Value Date/Time   PHART 7.420 04/05/2021 1107   HCO3 24.1 04/05/2021 1107   TCO2 25 04/05/2021 1107   ACIDBASEDEF 10.0 (H) 04/02/2021 0352   O2SAT 90.0 04/05/2021 1107   CBG (last 3)  Recent Labs    04/07/21 0331 04/07/21 0736 04/07/21 1105  GLUCAP 197* 205* 177*    Assessment/Plan: S/P Procedure(s) (LRB): EXPLORATION POST OPERATIVE OPEN HEART (N/A) Mobilize HD per nephro Pulm care nutrition   LOS: 23 days    Wonda Olds 04/07/2021

## 2021-04-07 NOTE — Progress Notes (Signed)
[facility]   Reason for visit: follow up on leukocytosis   Interval History: WBC continues to trend down to 27.3 now.  Remains afebrile.  Extubated this am.   Day 11 total antibiotics Day 11 cefepime  Physical Exam: Constitutional:  Vitals:   04/07/21 0814 04/07/21 0940  BP: (!) 87/46   Pulse: (!) 101 97  Resp: (!) 27 16  Temp:    SpO2: 96% 97%  He appears in na HENT:+ nasal cannula Respiratory: diffuse rhonchi, no respiratory distress Cardiovascular: RRR   Review of Systems: Constitutional: no fever, no chills GI: no diarrhea  Lab Results  Component Value Date   WBC 27.3 (H) 04/07/2021   HGB 9.3 (L) 04/07/2021   HCT 28.2 (L) 04/07/2021   MCV 93.4 04/07/2021   PLT 195 04/07/2021    Lab Results  Component Value Date   CREATININE 1.44 (H) 04/07/2021   BUN 18 04/07/2021   NA 135 04/07/2021   K 3.7 04/07/2021   CL 104 04/07/2021   CO2 24 04/07/2021    Lab Results  Component Value Date   ALT 24 04/04/2021   AST 40 04/04/2021   ALKPHOS 198 (H) 04/04/2021     Microbiology: Recent Results (from the past 240 hour(s))  MRSA PCR Screening     Status: None   Collection Time: 03/28/21  1:18 PM   Specimen: Nasopharyngeal  Result Value Ref Range Status   MRSA by PCR NEGATIVE NEGATIVE Final    Comment:        The GeneXpert MRSA Assay (FDA approved for NASAL specimens only), is one component of a comprehensive MRSA colonization surveillance program. It is not intended to diagnose MRSA infection nor to guide or monitor treatment for MRSA infections. Performed at [facility], [place] [address], [place], [place] [address]   Culture, Urine     Status: None   Collection Time: 04/02/21  8:17 AM   Specimen: Urine, Random  Result Value Ref Range Status   Specimen Description URINE, RANDOM  Final   Special Requests NONE  Final   Culture   Final    NO GROWTH Performed at [facility], [place] [address] [place], [place]  [address]    Report Status 04/03/2021 FINAL  Final  Culture, blood (routine x 2)     Status: Abnormal   Collection Time: 04/02/21 11:44 AM   Specimen: BLOOD LEFT HAND  Result Value Ref Range Status   Specimen Description BLOOD LEFT HAND  Final   Special Requests   Final    BOTTLES DRAWN AEROBIC ONLY Blood Culture results may not be optimal due to an inadequate volume of blood received in culture bottles   Culture  Setup Time   Final    GRAM POSITIVE COCCI IN CLUSTERS AEROBIC BOTTLE ONLY CRITICAL RESULT CALLED TO, READ BACK BY AND VERIFIED WITH: PHARMD LAURA SEAY BY MESSAN H. AT 0225 ON 04/05/2021    Culture (A)  Final    STAPHYLOCOCCUS EPIDERMIDIS THE SIGNIFICANCE OF ISOLATING THIS ORGANISM FROM A SINGLE VENIPUNCTURE CANNOT BE PREDICTED WITHOUT FURTHER CLINICAL AND CULTURE CORRELATION. SUSCEPTIBILITIES AVAILABLE ONLY ON REQUEST. Performed at [facility], [place] [address] [place], [place] [address]    Report Status 04/06/2021 FINAL  Final  Blood Culture ID Panel (Reflexed)     Status: Abnormal   Collection Time: 04/02/21 11:44 AM  Result Value Ref Range Status   Enterococcus faecalis NOT DETECTED NOT DETECTED Final  Enterococcus Faecium NOT DETECTED NOT DETECTED Final   Listeria monocytogenes NOT DETECTED NOT DETECTED Final   Staphylococcus species DETECTED (A) NOT DETECTED Final    Comment: CRITICAL RESULT CALLED TO, READ BACK BY AND VERIFIED WITH: PHARMD LAURA SEAY BY MESSAN H. AT 0225 ON 04/05/2021    Staphylococcus aureus (BCID) NOT DETECTED NOT DETECTED Final   Staphylococcus epidermidis DETECTED (A) NOT DETECTED Final    Comment: Methicillin (oxacillin) resistant coagulase negative staphylococcus. Possible blood culture contaminant (unless isolated from more than one blood culture draw or clinical case suggests pathogenicity). No antibiotic treatment is indicated for blood  culture contaminants. CRITICAL RESULT CALLED TO, READ BACK BY AND VERIFIED WITH: PHARMD LAURA SEAY BY  MESSAN H. AT 0225 ON 04/05/2021    Staphylococcus lugdunensis NOT DETECTED NOT DETECTED Final   Streptococcus species NOT DETECTED NOT DETECTED Final   Streptococcus agalactiae NOT DETECTED NOT DETECTED Final   Streptococcus pneumoniae NOT DETECTED NOT DETECTED Final   Streptococcus pyogenes NOT DETECTED NOT DETECTED Final   A.calcoaceticus-baumannii NOT DETECTED NOT DETECTED Final   Bacteroides fragilis NOT DETECTED NOT DETECTED Final   Enterobacterales NOT DETECTED NOT DETECTED Final   Enterobacter cloacae complex NOT DETECTED NOT DETECTED Final   Escherichia coli NOT DETECTED NOT DETECTED Final   Klebsiella aerogenes NOT DETECTED NOT DETECTED Final   Klebsiella oxytoca NOT DETECTED NOT DETECTED Final   Klebsiella pneumoniae NOT DETECTED NOT DETECTED Final   Proteus species NOT DETECTED NOT DETECTED Final   Salmonella species NOT DETECTED NOT DETECTED Final   Serratia marcescens NOT DETECTED NOT DETECTED Final   Haemophilus influenzae NOT DETECTED NOT DETECTED Final   Neisseria meningitidis NOT DETECTED NOT DETECTED Final   Pseudomonas aeruginosa NOT DETECTED NOT DETECTED Final   Stenotrophomonas maltophilia NOT DETECTED NOT DETECTED Final   Candida albicans NOT DETECTED NOT DETECTED Final   Candida auris NOT DETECTED NOT DETECTED Final   Candida glabrata NOT DETECTED NOT DETECTED Final   Candida krusei NOT DETECTED NOT DETECTED Final   Candida parapsilosis NOT DETECTED NOT DETECTED Final   Candida tropicalis NOT DETECTED NOT DETECTED Final   Cryptococcus neoformans/gattii NOT DETECTED NOT DETECTED Final   Methicillin resistance mecA/C DETECTED (A) NOT DETECTED Final    Comment: CRITICAL RESULT CALLED TO, READ BACK BY AND VERIFIED WITH: PHARMD LAURA SEAY BY MESSAN H. AT 0225 ON 04/05/2021 Performed at [facility], [address], [place], [place] [address]   Expectorated Sputum Assessment w Gram Stain, Rflx to Resp Cult     Status: None   Collection Time: 04/02/21  3:19 PM    Specimen: Expectorated Sputum  Result Value Ref Range Status   Specimen Description EXPECTORATED SPUTUM  Final   Special Requests NONE  Final   Sputum evaluation   Final    THIS SPECIMEN IS ACCEPTABLE FOR SPUTUM CULTURE Performed at [facility], [place] [address] [place], [place] [address]    Report Status 04/03/2021 FINAL  Final  Culture, Respiratory w Gram Stain     Status: None   Collection Time: 04/02/21  3:19 PM  Result Value Ref Range Status   Specimen Description EXPECTORATED SPUTUM  Final   Special Requests NONE Reflexed from [id number]  Final   Gram Stain   Final    MODERATE WBC PRESENT,BOTH PMN AND MONONUCLEAR ABUNDANT YEAST    Culture   Final    ABUNDANT Normal respiratory flora-no Staph aureus or Pseudomonas seen Performed at [facility], [place]  [address], [place],  [address]    Report Status 04/05/2021 FINAL  Final    Impression/Plan:  1. Leukocytosis - it continues to trend down.  Unclear etiology but no obvious infection except possible pneumonia based on CXR.  No extubated.  Will continue cefepime through tomorrow and stop.   Cultures all remain negative for significant organisms.    2.  ESRD - he continues on CRRT per neprhology.  On chronic intermittent hemodialysis.    3.  Cardiogenic shock - continues on some pressor support with levophed.    I will sign off, call with any questions or new concerns.

## 2021-04-07 NOTE — Procedures (Signed)
Intubation Procedure Note  [facility]  NZ:[id number]  1966-03-01  Date:04/07/21  Time:6:58 PM   Provider Performing:Daylon Lafavor D Rollene Rotunda    Procedure: Intubation ([id number])  Indication(s) Respiratory Failure  Consent Risks of the procedure as well as the alternatives and risks of each were explained to the patient and/or caregiver.  Consent for the procedure was obtained and is signed in the bedside chart   Anesthesia Etomidate and Rocuronium   Time Out Verified patient identification, verified procedure, site/side was marked, verified correct patient position, special equipment/implants available, medications/allergies/relevant history reviewed, required imaging and test results available.   Sterile Technique Usual hand hygeine, masks, and gloves were used   Procedure Description Patient positioned in bed supine.  Sedation given as noted above.  Patient was intubated with endotracheal tube using Glidescope.  View was Grade 1 full glottis .  Number of attempts was 1.  Colorimetric CO2 detector was consistent with tracheal placement.   Complications/Tolerance None; patient tolerated the procedure well. Chest X-ray is ordered to verify placement.   EBL Minimal   Specimen(s) None  JD Rexene Agent [facility] 04/07/2021, 6:58 PM  Please see [email or web address] for pager details.  From 7A-7P if no response, please call [phone]. After hours, please call ELink [phone].

## 2021-04-08 ENCOUNTER — Inpatient Hospital Stay (HOSPITAL_COMMUNITY): Payer: Medicare Other

## 2021-04-08 ENCOUNTER — Encounter (HOSPITAL_COMMUNITY): Payer: Self-pay | Admitting: Cardiothoracic Surgery

## 2021-04-08 DIAGNOSIS — I5023 Acute on chronic systolic (congestive) heart failure: Secondary | ICD-10-CM | POA: Diagnosis not present

## 2021-04-08 DIAGNOSIS — Z951 Presence of aortocoronary bypass graft: Secondary | ICD-10-CM | POA: Diagnosis not present

## 2021-04-08 DIAGNOSIS — R4182 Altered mental status, unspecified: Secondary | ICD-10-CM | POA: Diagnosis not present

## 2021-04-08 DIAGNOSIS — J9601 Acute respiratory failure with hypoxia: Secondary | ICD-10-CM | POA: Diagnosis not present

## 2021-04-08 LAB — RENAL FUNCTION PANEL
Albumin: 2.1 g/dL — ABNORMAL LOW (ref 3.5–5.0)
Albumin: 1.9 g/dL — ABNORMAL LOW (ref 3.5–5.0)
Anion gap: 12 (ref 5–15)
Anion gap: 12 (ref 5–15)
BUN: 19 mg/dL (ref 6–20)
BUN: 20 mg/dL (ref 6–20)
CO2: 23 mmol/L (ref 22–32)
CO2: 23 mmol/L (ref 22–32)
Calcium: 8.4 mg/dL — ABNORMAL LOW (ref 8.9–10.3)
Calcium: 8.1 mg/dL — ABNORMAL LOW (ref 8.9–10.3)
Chloride: 100 mmol/L (ref 98–111)
Chloride: 98 mmol/L (ref 98–111)
Creatinine, Ser: 1.24 mg/dL (ref 0.61–1.24)
Creatinine, Ser: 1.49 mg/dL — ABNORMAL HIGH (ref 0.61–1.24)
GFR, Estimated: >60 mL/min (ref >60)
GFR, Estimated: 55 mL/min — ABNORMAL LOW (ref >60)
Glucose, Bld: 123 mg/dL — ABNORMAL HIGH (ref 70–99)
Glucose, Bld: 238 mg/dL — ABNORMAL HIGH (ref 70–99)
Phosphorus: 1.7 mg/dL — ABNORMAL LOW (ref 2.5–4.6)
Phosphorus: 3.1 mg/dL (ref 2.5–4.6)
Potassium: 5.5 mmol/L — ABNORMAL HIGH (ref 3.5–5.1)
Potassium: 4.1 mmol/L (ref 3.5–5.1)
Sodium: 135 mmol/L (ref 135–145)
Sodium: 133 mmol/L — ABNORMAL LOW (ref 135–145)

## 2021-04-08 LAB — CBC
HCT: 32.2 % — ABNORMAL LOW (ref 39.0–52.0)
Hemoglobin: 10.5 g/dL — ABNORMAL LOW (ref 13.0–17.0)
MCH: 30.7 pg (ref 26.0–34.0)
MCHC: 32.6 g/dL (ref 30.0–36.0)
MCV: 94.2 fL (ref 80.0–100.0)
Platelets: 185 10*3/uL (ref 150–400)
RBC: 3.42 MIL/uL — ABNORMAL LOW (ref 4.22–5.81)
RDW: 23 % — ABNORMAL HIGH (ref 11.5–15.5)
WBC: 24.4 10*3/uL — ABNORMAL HIGH (ref 4.0–10.5)
nRBC: 0.2 % (ref 0.0–0.2)

## 2021-04-08 LAB — GLUCOSE, CAPILLARY
Glucose-Capillary: 108 mg/dL — ABNORMAL HIGH (ref 70–99)
Glucose-Capillary: 123 mg/dL — ABNORMAL HIGH (ref 70–99)
Glucose-Capillary: 132 mg/dL — ABNORMAL HIGH (ref 70–99)
Glucose-Capillary: 153 mg/dL — ABNORMAL HIGH (ref 70–99)
Glucose-Capillary: 166 mg/dL — ABNORMAL HIGH (ref 70–99)
Glucose-Capillary: 176 mg/dL — ABNORMAL HIGH (ref 70–99)
Glucose-Capillary: 209 mg/dL — ABNORMAL HIGH (ref 70–99)

## 2021-04-08 LAB — MAGNESIUM: Magnesium: 2.6 mg/dL — ABNORMAL HIGH (ref 1.7–2.4)

## 2021-04-08 LAB — HEPARIN LEVEL (UNFRACTIONATED): Heparin Unfractionated: 0.1 IU/mL — ABNORMAL LOW (ref 0.30–0.70)

## 2021-04-08 MED ORDER — CHLORHEXIDINE GLUCONATE CLOTH 2 % EX PADS
6.0000 | MEDICATED_PAD | Freq: Every day | CUTANEOUS | Status: DC
  Administered 2021-04-09 – 2021-04-15 (×7): 6 via TOPICAL

## 2021-04-08 MED ORDER — INSULIN GLARGINE 100 UNIT/ML ~~LOC~~ SOLN
10.0000 [IU] | Freq: Every day | SUBCUTANEOUS | Status: DC
  Administered 2021-04-09 – 2021-04-11 (×3): 10 [IU] via SUBCUTANEOUS
  Filled 2021-04-08 (×4): qty 0.1

## 2021-04-08 MED ORDER — JUVEN PO PACK
1.0000 | PACK | Freq: Two times a day (BID) | ORAL | Status: DC
  Administered 2021-04-08 – 2021-04-09 (×3): 1 via ORAL
  Filled 2021-04-08 (×4): qty 1

## 2021-04-08 MED ORDER — ETOMIDATE 2 MG/ML IV SOLN
20.0000 mg | Freq: Once | INTRAVENOUS | Status: AC
  Administered 2021-04-08: 20 mg via INTRAVENOUS
  Filled 2021-04-08: qty 10

## 2021-04-08 MED ORDER — LIDOCAINE-EPINEPHRINE (PF) 1 %-1:200000 IJ SOLN
0.0000 mL | Freq: Once | INTRAMUSCULAR | Status: DC | PRN
  Filled 2021-04-08 (×2): qty 30

## 2021-04-08 MED ORDER — FENTANYL CITRATE (PF) 100 MCG/2ML IJ SOLN: 100.0000 ug | Freq: Once | INTRAMUSCULAR | Status: DC

## 2021-04-08 MED ORDER — MIDAZOLAM HCL 2 MG/2ML IJ SOLN
4.0000 mg | Freq: Once | INTRAMUSCULAR | Status: AC
  Administered 2021-04-08: 2 mg via INTRAVENOUS
  Administered 2021-04-08: 4 mg via INTRAVENOUS
  Filled 2021-04-08: qty 4

## 2021-04-08 MED ORDER — LIDOCAINE-EPINEPHRINE (PF) 2 %-1:200000 IJ SOLN
30.0000 mL | Freq: Once | INTRAMUSCULAR | Status: DC
  Filled 2021-04-08: qty 30

## 2021-04-08 MED ORDER — SODIUM PHOSPHATES 45 MMOLE/15ML IV SOLN
30.0000 mmol | Freq: Once | INTRAVENOUS | Status: AC
  Administered 2021-04-08: 30 mmol via INTRAVENOUS
  Filled 2021-04-08: qty 10

## 2021-04-08 MED ORDER — COLLAGENASE 250 UNIT/GM EX OINT
TOPICAL_OINTMENT | Freq: Every day | CUTANEOUS | Status: DC
  Filled 2021-04-08: qty 30

## 2021-04-08 MED ORDER — HEPARIN (PORCINE) 25000 UT/250ML-% IV SOLN
2350.0000 [IU]/h | INTRAVENOUS | Status: DC
  Administered 2021-04-08 – 2021-04-09 (×4): 2800 [IU]/h via INTRAVENOUS
  Administered 2021-04-10 – 2021-04-13 (×9): 2750 [IU]/h via INTRAVENOUS
  Administered 2021-04-14: 2450 [IU]/h via INTRAVENOUS
  Administered 2021-04-14: 2350 [IU]/h via INTRAVENOUS
  Filled 2021-04-08 (×15): qty 250

## 2021-04-08 MED ORDER — VECURONIUM BROMIDE 10 MG IV SOLR
10.0000 mg | Freq: Once | INTRAVENOUS | Status: AC
  Administered 2021-04-08: 10 mg via INTRAVENOUS
  Filled 2021-04-08: qty 10

## 2021-04-08 MED ORDER — POTASSIUM & SODIUM PHOSPHATES 280-160-250 MG PO PACK
1.0000 | PACK | Freq: Every day | ORAL | Status: DC
  Administered 2021-04-08: 1
  Filled 2021-04-08 (×2): qty 1

## 2021-04-08 MED ORDER — LIDOCAINE HCL (PF) 2 % IJ SOLN
0.0000 mL | Freq: Once | INTRAMUSCULAR | Status: DC | PRN
  Filled 2021-04-08 (×2): qty 20

## 2021-04-08 NOTE — Progress Notes (Signed)
cEEG study started.  Patient event button tested.  Notified [facility] monitoring.

## 2021-04-08 NOTE — Procedures (Signed)
Percutaneous Tracheostomy Procedure Note   Tavari Covalt  NZ:[id number]  1966-10-11  Date:04/08/21  Time:2:03 PM   Provider Performing:Marigrace Mccole  Procedure: Percutaneous Tracheostomy with Bronchoscopic Guidance ([id number])  Indication(s) Acute respiratory failure  Consent Risks of the procedure as well as the alternatives and risks of each were explained to the patient and/or caregiver.  Consent for the procedure was obtained.  Anesthesia Etomidate, Versed, Fentanyl, Vecuronium   Time Out Verified patient identification, verified procedure, site/side was marked, verified correct patient position, special equipment/implants available, medications/allergies/relevant history reviewed, required imaging and test results available.   Sterile Technique Maximal sterile technique including sterile barrier drape, hand hygiene, sterile gown, sterile gloves, mask, hair covering.    Procedure Description Appropriate anatomy identified by palpation.  Patient's neck prepped and draped in sterile fashion.  1% lidocaine with epinephrine was used to anesthetize skin overlying neck.  1.5cm incision made and blunt dissection performed until tracheal rings could be easily palpated.   Then a size 6 Shiley tracheostomy was placed under bronchoscopic visualization using usual Seldinger technique and serial dilation.   Bronchoscope confirmed placement above the carina.  Tracheostomy was sutured in place with adhesive pad to protect skin under pressure.    Patient connected to ventilator.   Complications/Tolerance None; patient tolerated the procedure well. Chest X-ray is ordered to confirm no post-procedural complication.   EBL Minimal    Specimen(s) None

## 2021-04-08 NOTE — Progress Notes (Signed)
[facility] Progress Note   Assessment/ Plan:   Outpatient dialysis prescription: [facility] [phone] 4 Hours, 180 dialyzer, 2K/2.5 Ca, DFR 500, BFR 400 Use AVF; graft on inner arm, 15 gauge Heparin loading dose 2000 and hourly dose of 1200 EDW 78.5 kg Last post weight 78 kg on 5/21 (went in at 78 and came out at 78 kg) Meds: epogen 2400 units each tx venofer 50 mg weekly Not on hectorol or calcitriol sensipar 30 mg three times a week with HD   Assessment/Plan:   Septic shock- on broad spectrum abx- Flagyl, cefepime.  Eraxis and flagyl stopped 04/05/21.  ID following.  Pressors per PCCM Acute hypoxic RF: intubated per PCCM--> failed extubation 6/15, now back intubated.  Considering trach.   ESRD - [place] dialysis in [place], last  outpatient HD 5/30 now on CRRT since 04/16/2021 following CABG complicated by PEA arrest and need for pressors.   - CRRT 04/22/2021-03/30/21 and temp HD catheter removed at that time.       - CRRT resumed 04/02/21 due to acute decompensation and profound hypotension, has L femoral temporary HD cath       - contineu UF rate 100-150 mL/ hr net neg today      - Please note that this pt is ESRD and PICC/ midline is not appropriate--> additional triple lumen/ CVC would be OK      - Overall I am concerned that he really hasn't made any forward progress.  He still remains on pressors on CRRT.  Transitioning to IHD is risky at this time and wouldn't really be a forward move because of his pressor requirement.  Additionally- if pt is trached, his overall long-term dispo will be very complicated going forward, especially if he is a prolonged vent wean/ unable to be decannulated.  There are limited numbers of facilities that take Trach/ dialysis patients and recently [facility] here in [place] has stopped admitting these patients.  Most of these facilities are out of state.  Discussed concerns with PCCM.  Appreciate CM looking into this as well.    CAD  s/p CABG x 4 complicated by PEA arrest.  Acute on chronic CHF with cardiogenic shock post CABG - on midodrine 15 mg tid.  Repeat ECHO EF 45-50%, RV function moderately reduced. Pulmonary HTN - per HF team Cirrhosis - presumably cardiogenic.  S/p paracentesis on 03/18/21. 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  Hypophosphatemia - repleted with IV phos and follow.  Has required multiple phos repletions- will see if Neutraphos is too gritty to get down the cortrak, appreciate pharmacy Afib/ flutter: on systemic heparin gtt and amiodarone Dispo: ICU  Subjective:    Failed extubation yesterday, re-intubated.  Considering trach   Objective:   BP (!) 101/52   Pulse 90   Temp 98.9 F (37.2 C) (Oral)   Resp (!) 30   Ht '6\' 2"'  (1.88 m)   Wt 71.5 kg   SpO2 100%   BMI 20.24 kg/m   Physical Exam: Gen: ill appearing, cachectic, intubated, awake and following commands CVS: irregular, tachycardic Resp: coarse mechanical bilaterally Abd: soft Ext: sarcopenic, dependent thigh edema ACCESS: L nontunneled femoral HD catheter, R UE AVF + T/B  Labs: BMET Recent Labs  Lab 04/05/21 1536 04/06/21 0055 04/06/21 2054 04/07/21 0129 04/07/21 0244 04/07/21 1504 04/07/21 1924 04/07/21 2002 04/08/21 0427  NA 136 136 135 135 135 135 134* 135 135  K 4.0 3.8  3.6 4.2 3.7 4.5 6.2* 4.5 5.5*  CL 101 106 102 100 104 101 99  --  100  CO2 '26 22 22 22 24 27 ' 21*  --  23  GLUCOSE 134* 126* 163* 145* 172* 124* 219*  --  123*  BUN '13 12 19 19 18 18 20  ' --  19  CREATININE 1.25* 1.25* 1.49* 1.39* 1.44* 1.33* 1.25*  --  1.24  CALCIUM 8.0* 7.0* 7.9* 8.0* 7.7* 7.7* 8.0*  --  8.4*  PHOS 1.1* 2.2* 1.7* 1.5*  --  4.5 4.0  --  1.7*   CBC Recent Labs  Lab 04/01/21 1328 04/01/21 1526 04/03/21 0021 04/04/21 0123 04/05/21 0401 04/06/21 2054 04/07/21 0244 04/07/21 1924 04/07/21 2002 04/08/21 0427  WBC 28.1*   < > 82.3* 71.3*   < > 29.4* 27.3* 29.5*  --  24.4*  NEUTROABS  21.1*  --  75.2* 64.6*  --   --   --  28.0*  --   --   HGB 9.9*   < > 8.8* 9.1*   < > 10.2* 9.3* 11.7* 11.9* 10.5*  HCT 33.2*   < > 26.2* 27.0*   < > 30.6* 28.2* 36.8* 35.0* 32.2*  MCV 100.0   < > 91.3 89.1   < > 92.4 93.4 97.6  --  94.2  PLT 326   < > 256 291   < > 216 195 215  --  185   < > = values in this interval not displayed.      Medications:     amiodarone  200 mg Per Tube BID   aspirin  81 mg Per Tube Daily   atorvastatin  40 mg Per Tube Daily   bisacodyl  10 mg Oral Daily   Or   bisacodyl  10 mg Rectal Daily   chlorhexidine gluconate (MEDLINE KIT)  15 mL Mouth Rinse BID   Chlorhexidine Gluconate Cloth  6 each Topical Daily   darbepoetin (ARANESP) injection - NON-DIALYSIS  60 mcg Subcutaneous Q Thu-1800   docusate  100 mg Per Tube BID   etomidate  20 mg Intravenous Once   feeding supplement (PROSource TF)  45 mL Per Tube QID   insulin aspart  0-24 Units Subcutaneous Q4H   insulin glargine  10 Units Subcutaneous Daily   ipratropium-albuterol  3 mL Nebulization TID   mouth rinse  15 mL Mouth Rinse 10 times per day   midazolam  4 mg Intravenous Once   midodrine  15 mg Per Tube TID WC   multivitamin  1 tablet Per Tube QHS   pantoprazole sodium  40 mg Per Tube Daily   polyethylene glycol  17 g Per Tube Daily   sodium chloride flush  10-40 mL Intracatheter Q12H   sodium chloride flush  3 mL Intravenous Q12H   Thrombi-Pad  1 each Topical Once   vecuronium  10 mg Intravenous Once     Madelon Lips, MD 04/08/2021, 9:08 AM

## 2021-04-08 NOTE — Consult Note (Signed)
[facility] Nurse Consult Note: Reason for Consult:Unstageable pressure injury to sacrococcygeal area.  First noted 03/25/21 after patient experience PEA arrest with intubation and return to OR for exploration with chest tube placed.  Patient with chronic illness, ESRD and CHF and severe malnutrition upon admission. Nutritional supplementation via Cortrak started 03/25/21. Deep tissue injuries to bilateral heels.  Intact maroon discoloration. Prevalon boots in place. SCDs in place as well.  Maroon discoloration present to right dorsal foot, unclear etiology.   Wound type:full thickness pressure injury  Pressure Injury POA: No Patient was admitted prior to CABG procedure for optimization of health due to known decline.  Measurement: Sacrum/coccyx:  3 cm x 8 cm wound bed is 100% thin fibrin slough.  Left posterior heel:  2 cm x 2.5 cm intact maroon discoloration Right posterior heel:  4 cm x 5.2 cm intact maroon discoloration Right dorsal foot: 2.2 cm x 0.5 cm intact maroon discoloration Wound [id number] to sacral wound  others are intact  Drainage (amount, consistency, odor) minimal serosanguinous  no odor.  Periwound: dry skin Dressing procedure/placement/frequency: Cleanse sacral wound with NS and pat dry.  Apply Santyl to wound bed and cover with NS moist gauze.  Cover with silicone foam. Reapply Santyl/moist gauze daily and change foam dressing every three days and PRN soilage.  Offload pressure to bilateral heels with prevalon boots.  If wounds evolve and open, re-consult WOC team.  Turn and reposition every two hours.  Patient is on mattress with low air loss feature.  Will not follow at this time.  Please re-consult if needed.  Domenic Moras MSN, RN, FNP-BC CWON Wound, Ostomy, Continence Nurse Pager [phone]

## 2021-04-08 NOTE — Progress Notes (Addendum)
NAME:  David Ryle., MRN:  [id number], DOB:  [birth year], LOS: 24 ADMISSION DATE:  03/18/2021, CONSULTATION DATE:  04/12/2021 REFERRING MD:  TCTS CHIEF COMPLAINT: Code   History of Present Illness:  55 year old man with PMHx significant for HTN, T2DM, PAD, ESRD, cirrhosis (query cardiac etiology) who presented for symptomatic ischemic cardiomyopathy.    Patient underwent preoperative optimization with CHF, GI, and Nephrology teams with eventual CABG 5/31. On 6/1, patient developed respiratory distress followed by PEA arrest requiring CPR. Code was c/b increased bleeding around surgical sites, prompting mediastinal exploration 6/1. Intraoperative findings were notable for bleeding around mammary graft site and diffuse oozing for which patient received multiple blood products.   Patient intubated and on pressors postoperatively and PCCM was consulted for assistance with management.  Pertinent Medical History:  ESRD on HD Cardiac cirrhosis with portal hypertension IBS IDDM  Significant Hospital Events: Including procedures, antibiotic start and stop dates in addition to other pertinent events   5/23 Admitted 5/26 Paracentesis 4L 5/30 HD 5/31 CABG 5/31- 6/1 Code Blue, OR for mediastinal exploration 2/2 hemorrhagic shock induced by CPR. Factor 7, protamine, FFP, cryo given 6/1 PCCM consult, 4U PRBCs and 1U Plt . R pigtail chest tube placed due to limitation of vent weaning. 6/2 Extubated, chest tube 3.2L out past 24 hrs 6/3 Remains on 2L [place], chest tube with 500cc out/ 24 hrs 6/4 Remains on CRRT, 2L [place], [place], [place]. 3 chest tubes remain. 6/6 Remains on CRRT, 4 chest tubes in place 6/9 Decreasing pressor requirements and CT output. CRRT transitioned to iHD. Echo repeated. Prior to HD session, patient suddenly became unresponsive with fixed and dilated pupils, minimal gag, breathing spontaneously. Code Stroke called. Reintubated, heparin d/c'ed, STAT CT Head/CTA. 6/10, WBC jump to 89 K,  Temp of 102,Remains on 40 of Levo, 40 of Neo, and Vaso at 0.03.Pupils are 6 and non-responsive to light. Trialysis cath inserted for CVVHD. R IJ is 64 days old, will get PICC and discontinue. Lactate of 2.8. Pigtail cath removed  6/12 coming down on pressors? Patient self extubated and reintubated 6/13 MRI negative for acute findings 6/15 Patient extubated; spoke with family and patient, if he fails extubation trial and requires reintubation they are okay with trach placement. Levo at 8   Interim History / Subjective:  Resting on ventilator awaiting trach for today   Objective   Blood pressure (Abnormal) 101/52, pulse 90, temperature 98.9 F (37.2 C), temperature source Oral, resp. rate (Abnormal) 30, height '6\' 2"'  (1.88 m), weight 71.5 kg, SpO2 100 %. 4LNC    Vent Mode: PRVC FiO2 (%):  [50 %-100 %] 50 % Set Rate:  [28 bmp-30 bmp] 30 bmp Vt Set:  [570 mL] 570 mL PEEP:  [5 cmH20] 5 cmH20 Plateau Pressure:  [14 cmH20-21 cmH20] 14 cmH20   Intake/Output Summary (Last 24 hours) at 04/08/2021 0924 Last data filed at 04/08/2021 0900 Gross per 24 hour  Intake 3190.47 ml  Output 5811 ml  Net -2620.53 ml   Filed Weights   04/06/21 0442 04/07/21 0438 04/08/21 0451  Weight: 72.6 kg 72.4 kg 71.5 kg   Physical Examination: General: This is a cachectic 55 year old male patient who remains on full ventilator support HEENT temporal wasting orally intubated no JVD Pulmonary: Decreased bases no accessory use, some scattered rhonchi Cardiac: Regular rate and rhythm Abdomen: Soft not tender Extremities: Warm dry, AV fistula with good bruit and thrill on upper extremity Neuro: Awake, intermittently interactive, not moving extremities today. 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 CT head, MRI, and EEG negative Unclear what caused this episode.  Work-up as so far been unrevealing Plan: Cont supportive care Neuro consulted  Acute hypoxemic respiratory failure due to inability to protect airway Pulmonary edema and Bilateral pleural effusions (chest tubes removed.) and  LLL HAP vs pleural effusion Pcxr from 6/15 w/ bibasilar airspace disease.  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; difficult access, RUE AVF Trialysis catheter placed 6/10 Plan: Cont CRRT IR consult for tunneled HD cath   Intermittent fluid electrolyte imbalance: Hyperkalemia Plan Continue volume removal Address electrolytes daily with CRRT  DM2  Plan: Cont ssi Hold basal insulin today   Sacral pressure ulcer Plan Care as outlined by  wound team   Best Practice (right click and "Reselect all SmartList Selections" daily)   Diet/type: tubefeeds Pain/Anxiety/Delirium protocol Yes and RASS goal: 0 to -1 VAP protocol (if indicated): Yes DVT prophylaxis: systemic heparin GI prophylaxis: PPI Glucose control:  SSI Central venous access:  Yes, and it is still needed Arterial line:  N/A Foley:  N/A Mobility:  bed rest  PT consulted: Yes Studies pending: None Culture data pending:none Last reviewed culture data:today Antibiotics:cefepime Antibiotic de-escalation: no,  continue current rx Stop date: ordered Daily labs: requested Code Status:  full code Last date of multidisciplinary goals of care discussion [per primary ] placing care management consult. Disposition will be a challenge  Serious Disposition: remains critically ill, will stay in intensive care   My cct 32 min   Erick Colace ACNP-BC [employer] Pager # [phone] OR # [phone] if no answer

## 2021-04-08 NOTE — Progress Notes (Signed)
15 Days Post-Op Procedure(s) (LRB): EXPLORATION POST OPERATIVE OPEN HEART (N/A) Subjective: alert  Objective: Vital signs in last 24 hours: Temp:  [97.5 F (36.4 C)-98.9 F (37.2 C)] 98.8 F (37.1 C) (06/16 1538) Pulse Rate:  [77-102] 93 (06/16 1602) Cardiac Rhythm: Normal sinus rhythm (06/16 0800) Resp:  [20-30] 30 (06/16 1602) BP: (68-184)/(15-144) 132/64 (06/16 1602) SpO2:  [10 %-100 %] 100 % (06/16 1602) FiO2 (%):  [40 %-100 %] 50 % (06/16 1602) Weight:  [71.5 kg] 71.5 kg (06/16 0451)  Hemodynamic parameters for last 24 hours:    Intake/Output from previous day: 06/15 0701 - 06/16 0700 In: 3530.4 [I.V.:1650.3; NG/GT:1420; IV Piggyback:460.1] Out: 5741 [Stool:1] Intake/Output this shift: Total I/O In: 870.5 [I.V.:657.4; IV Piggyback:213.1] Out: 2060 [Emesis/NG output:250; Other:1810]  General appearance: alert and cooperative Neurologic: intact Heart: regular rate and rhythm, S1, S2 normal, no murmur, click, rub or gallop Lungs: diminished breath sounds bilaterally Wound: c/d/i  Lab Results: Recent Labs    04/07/21 1924 04/07/21 2002 04/08/21 0427  WBC 29.5*  --  24.4*  HGB 11.7* 11.9* 10.5*  HCT 36.8* 35.0* 32.2*  PLT 215  --  185   BMET:  Recent Labs    04/08/21 0427 04/08/21 1636  NA 135 133*  K 5.5* 4.1  CL 100 98  CO2 23 23  GLUCOSE 123* 238*  BUN 19 20  CREATININE 1.24 1.49*  CALCIUM 8.4* 8.1*    PT/INR: No results for input(s): LABPROT, INR in the last 72 hours. ABG    Component Value Date/Time   PHART 7.406 04/07/2021 2002   HCO3 26.9 04/07/2021 2002   TCO2 28 04/07/2021 2002   ACIDBASEDEF 10.0 (H) 04/02/2021 0352   O2SAT 100.0 04/07/2021 2002   CBG (last 3)  Recent Labs    04/08/21 0733 04/08/21 1119 04/08/21 1537  GLUCAP 108* 176* 209*    Assessment/Plan: S/P Procedure(s) (LRB): EXPLORATION POST OPERATIVE OPEN HEART (N/A) Mobilize Vent wean Nutrition Suggest tunneled HD cath via Right IJ   LOS: 24 days    Wonda [facility] 04/08/2021

## 2021-04-08 NOTE — Care Management (Signed)
04-08-21 Case Manager received a secure chat from the [facility] regarding [place]. Case Manager reached out to [facility] to see if both can look at the patient to see if eligible for LTAC.[facility] has not been offered to the family. Case Manager spoke with Laurey Arrow NP and he stated that the patient is not quite ready for LTAC at this time. Case Manager will continue to follow for additional transition of care needs.

## 2021-04-08 NOTE — Progress Notes (Signed)
LTM maint complete - study online to server.   All leads in place. [facility] monitored, Event button test confirmed by [facility].

## 2021-04-08 NOTE — Progress Notes (Signed)
[facility] Progress Note Patient Name: David Rose. DOB: Aug 05, 1966 MRN: [id number]   Date of Service  04/08/2021  HPI/Events of Note  Plans for trach around midday tomorrow. Nursing confusion about when to stop tube feedings. Patient on Lantus and Novolog SSI.  eICU Interventions  Plan: Stop enteral nutrition now. D10 0.45 NaCl to run IV at 40 mL/hour.     Intervention Category Major Interventions: Other:  [name] 04/08/2021, 12:00 AM

## 2021-04-08 NOTE — Consult Note (Signed)
Chief Complaint: Patient was seen in consultation today for renal failure  Referring Physician(s): Salvadore Dom, NP  Supervising Physician: Mir, Sharen Heck  Patient Status: David Rose  History of Present Illness: David Waychoff. is a 55 y.o. male with past medical history of CAD, ESRD who underwent CABG x4 03/04/2021.  Unfortunately he suffered PEA arrest post-procedure with respiratory failure related to hemorrhagic/cardiogenic/septic shock.  Re-exploration of the chest demonstrated bleeding from his LAD anastomosis which required significant resuscitation. He has a history of ESRD on HD via R upper arm fistula, however currently is requiring CRRT with anticipated need for tunneled dialysis catheter.  IR consulted for tunneled dialysis catheter placement.   Case reviewed by Dr. Dwaine Gale who notes recent positive blood cultures and ongoing leukocytosis.  This does appear to be improving, however patient with ongoing critical illness.  Recommends holding on tunneled catheter placement until WBC resolved or blood cultures negative.  Discussed with CCM NP who states tunneled catheter preferred to temporary catheter at this time as patient has working trialysis catheter to left groin.  Patient s/p trach placement today.   Past Medical History:  Diagnosis Date   ESRD (end stage renal disease) ([place])    Hypertension    Peripheral arterial disease ([place])    Type 2 diabetes mellitus ([place])     Past Surgical History:  Procedure Laterality Date   AV FISTULA PLACEMENT Right    CORONARY ARTERY BYPASS GRAFT N/A 03/20/2021   Procedure: CORONARY ARTERY BYPASS GRAFTING (CABG)X4. LEFT INTERNAL MAMMARY ARTERY. RIGHT ENDOSCOPIC SAPHENOUS VEIN HARVESTING.;  Surgeon: Wonda Olds, MD;  Location: [facility];  Service: Open Heart Surgery;  Laterality: N/A;   EXPLORATION POST OPERATIVE OPEN HEART N/A 03/30/2021   Procedure: EXPLORATION POST OPERATIVE OPEN HEART;  Surgeon: Wonda Olds, MD;  Location: [facility];   Service: Thoracic;  Laterality: N/A;   IR PARACENTESIS  03/18/2021   PARACENTESIS     RIGHT HEART CATH N/A 03/04/2021   Procedure: RIGHT HEART CATH;  Surgeon: Larey Dresser, MD;  Location: [facility];  Service: Cardiovascular;  Laterality: N/A;   RIGHT/LEFT HEART CATH AND CORONARY ANGIOGRAPHY N/A 02/24/2021   Procedure: RIGHT/LEFT HEART CATH AND CORONARY ANGIOGRAPHY;  Surgeon: Troy Sine, MD;  Location: [facility];  Service: Cardiovascular;  Laterality: N/A;   TEE WITHOUT CARDIOVERSION N/A 03/19/2021   Procedure: TRANSESOPHAGEAL ECHOCARDIOGRAM (TEE);  Surgeon: Wonda Olds, MD;  Location: [facility];  Service: Open Heart Surgery;  Laterality: N/A;    Allergies: Shellfish-derived products, Wasp venom protein, and Penicillins  Medications: Prior to Admission medications   Medication Sig Start Date End Date Taking? Authorizing Provider  Eluxadoline (VIBERZI) 75 MG TABS Take 75 mg by mouth in the morning and at bedtime.   Yes [provider]  furosemide (LASIX) 80 MG tablet Take 80 mg by mouth daily as needed for fluid. 02/20/20  Yes [provider]  lidocaine-prilocaine (EMLA) cream Apply 1 application topically as needed (before dialysis).   Yes [provider]  metoprolol succinate (TOPROL-XL) 50 MG 24 hr tablet Take 50 mg by mouth every evening. Take with or immediately following a meal.   Yes [provider]  nitroGLYCERIN (NITROSTAT) 0.4 MG SL tablet Place 1 tablet (0.4 mg total) under the tongue every 5 (five) minutes as needed. Patient taking differently: Place 0.4 mg under the tongue every 5 (five) minutes x 3 doses as needed for chest pain. 02/24/21  Yes Cheryln Manly,  NP  sevelamer carbonate (RENVELA) 800 MG tablet Take 1,600 mg by mouth 3 (three) times daily with meals.   Yes [provider]     Family History  Problem Relation Age of Onset   Thyroid cancer Mother    Leukemia Father     Social History    Socioeconomic History   Marital status: Divorced    Spouse name: Not on file   Number of children: Not on file   Years of education: Not on file   Highest education level: Not on file  Occupational History   Not on file  Tobacco Use   Smoking status: Never   Smokeless tobacco: Never  Substance and Sexual Activity   Alcohol use: Not on file   Drug use: Not on file   Sexual activity: Not on file  Other Topics Concern   Not on file  Social History Narrative   Pt has 1 year of college.    Social Determinants of Health   Financial Resource Strain: Not on file  Food Insecurity: Not on file  Transportation Needs: Not on file  Physical Activity: Not on file  Stress: Not on file  Social Connections: Not on file     Review of Systems: A 12 point ROS discussed and pertinent positives are indicated in the HPI above.  All other systems are negative.  Review of Systems  Unable to perform ROS: Intubated   Vital Signs: BP (!) 115/54   Pulse (!) 102   Temp 98.8 F (37.1 C) (Oral)   Resp (!) 30   Ht '6\' 2"'$  (1.88 m)   Wt 157 lb 10.1 oz (71.5 kg)   SpO2 100%   BMI 20.24 kg/m   Physical Exam Vitals and nursing note reviewed.  Constitutional:      General: He is not in acute distress.    Appearance: Normal appearance. He is ill-appearing.  HENT:     Mouth/Throat:     Mouth: Mucous membranes are moist.     Pharynx: Oropharynx is clear.  Neck:     Comments: Scab from prior R IJ placement.  Cardiovascular:     Rate and Rhythm: Normal rate and regular rhythm.  Pulmonary:     Effort: Pulmonary effort is normal. No respiratory distress.     Comments: On venitlator via trach. Skin:    General: Skin is warm and dry.  Neurological:     Mental Status: He is alert.     MD Evaluation Airway: WNL Heart: WNL Abdomen: WNL Chest/ Lungs: WNL ASA  Classification: 3 Mallampati/Airway Score: Three   Imaging: CT ABDOMEN PELVIS WO CONTRAST  Result Date: 04/03/2021 CLINICAL  DATA:  Right upper quadrant pain, leukocytosis, Murphy sign EXAM: CT ABDOMEN AND PELVIS WITHOUT CONTRAST TECHNIQUE: Multidetector CT imaging of the abdomen and pelvis was performed following the standard protocol without IV contrast. COMPARISON:  CTa chest 04/01/2021 FINDINGS: Lower chest: Multifocal patchy ground-glass and consolidative opacities in the lung bases with partial atelectatic collapse of both lower lobes. Bilateral pleural effusions as well including some questionable loculation of the left effusion. Borderline cardiomegaly. 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 Postsurgical changes from prior sternotomy. Hepatobiliary: Lobular hepatic surface contour. Hypertrophy of the left lobe and caudate. No concerning focal liver lesion is seen. Few vascular calcifications. Hyperdense material layering within the moderately distended gallbladder, possible vicarious extravasation of contrast from recent CT imaging versus layering biliary sludge. No discernible gallstones. No biliary ductal dilatation.  Pancreas: Extensive fatty replacement of the pancreas. No pancreatic ductal dilatation or surrounding inflammatory changes. Spleen: Punctate calcifications throughout the spleen likely reflecting granulomata. Normal splenic size. No concerning focal splenic lesion. Adrenals/Urinary Tract: Normal adrenals. Moderate bilateral perinephric stranding is symmetric and nonspecific. Appearance is similar to comparison CT imaging extensive vascular calcifications. No hydronephrosis or evidence of obstructive uropathy. Hyperdense material layering within the partially decompressed urinary bladder likely reflecting excreted contrast media. Bladder wall thickening is nonspecific given underdistention. Stomach/Bowel: Transesophageal tube tip and side port distal to the GE junction, terminating near the gastric antrum. Mild mural thickening and stranding about the  terminal ileum as well as diffusely throughout the colonic segments. No evidence of bowel obstruction. Normal caliber appendix in the right quadrant. Vascular/Lymphatic: Extensive atherosclerotic calcification of the abdominal aorta and branch vessels. No aneurysm or ectasia. No other gross complication limitations of an unenhanced CT. Reproductive: Coarse eccentric calcification of the prostate. No concerning abnormalities of the prostate or seminal vesicles. High-riding appearance of the bilateral testes with small hydroceles. Other: Small moderate volume ascites particularly in the right upper quadrant and layering in the deep pelvis pericolic gutter. Extensive circumferential body wall edema. No free intraperitoneal air. Fat and fluid containing left paraumbilical hernia. No bowel containing hernia. Musculoskeletal: No acute osseous abnormality or suspicious osseous lesion. Multilevel degenerative changes are present in the imaged portions of the spine. Posterior spurring and calcified disc bulge L5-S1 resulting in severe canal stenosis and bilateral foraminal narrowing. IMPRESSION: 1. Stigmata of cirrhosis with a nodular liver surface contour as well as hypertrophy of the left lobe and caudate. 2. Features of developing anasarca with moderate ascites and extensive circumferential body wall edema as well as bilateral pleural effusions. 3. Moderate distention of the gallbladder with layering hyperdense material which could reflect vicarious extravasation of contrast given recent contrast enhanced exams. No gallbladder wall thickening or biliary ductal dilatation is seen however if there is persisting clinical concern, could consider right upper quadrant ultrasound. 4. Circumferential thickening of the terminal ileum and diffusely throughout the colon, nonspecific in the setting of intrinsic liver disease. Could reflect portal enteropathy/colopathy versus ileocolitis in the appropriate clinical setting. 5. Patchy  opacities in bilateral lung bases as well as densely atelectatic bilateral lower lobes, could reflect a combination of atelectasis, underlying airspace disease/infection and/or edema. Bilateral effusions with some questionable loculation of the left effusion as well. 6. Mild cardiomegaly with three-vessel coronary disease in postsurgical changes from sternotomy and CABG. Electronically Signed   By: Lovena Le M.D.   On: 04/03/2021 00:24   DG Chest 1 View  Result Date: 04/03/2021 CLINICAL DATA:  Status post chest tube placement. EXAM: CHEST  1 VIEW COMPARISON:  Single-view of the chest earlier today. FINDINGS: A new pigtail catheter projects in the right lower chest. Support tubes and lines are otherwise unchanged. Right effusion and airspace disease appear unchanged. No pneumothorax. The left lung is clear. Heart size is normal. IMPRESSION: New pigtail catheter projects in the lower right chest. No change in a right effusion and airspace disease. No pneumothorax or other new abnormality. Electronically Signed   By: Inge Rise M.D.   On: 03/30/2021 10:22   DG Abd 1 View  Result Date: 04/02/2021 CLINICAL DATA:  MRI clearance EXAM: ABDOMEN - 1 VIEW COMPARISON:  None. FINDINGS: Normal abdominal gas pattern. Nasogastric tube seen in the expected distal body of the stomach. No unexpected metallic foreign body noted within the visualized abdomen. IMPRESSION: No unexpected metallic foreign body within  the abdomen. Electronically Signed   By: Fidela Salisbury MD   On: 04/02/2021 00:36   CT CHEST WO CONTRAST  Result Date: 03/02/2021 CLINICAL DATA:  Biventricular dysfunction. Multi-vessel coronary artery disease. CABG planning. EXAM: CT CHEST WITHOUT CONTRAST TECHNIQUE: Multidetector CT imaging of the chest was performed following the standard protocol without IV contrast. COMPARISON:  Report from study 11/24/2014 FINDINGS: Cardiovascular: Mild four-chamber cardiac enlargement. No pericardial fluid. Extensive  coronary artery calcification and/or stents. Aortic atherosclerotic calcification without evidence of aneurysm or dissection on this noncontrast study. Ascending aorta is not dilated. Mediastinum/Nodes: No mass or lymphadenopathy. Lungs/Pleura: Lungs are clear. No infiltrate, collapse or effusion. No emphysema. Upper Abdomen: Cirrhosis of the liver. Sludge or small stones dependent in the gallbladder. Prominent amount of ascites, diffusely distributed. Musculoskeletal: No significant bone finding. IMPRESSION: Cardiomegaly with four-chamber enlargement. Extensive calcification and/or stents within the coronary arteries. Aortic atherosclerotic calcification without evidence of aneurysmal dilatation or discernible dissection. No significant pulmonary finding. Cirrhosis of the liver. Gallstones dependent in the gallbladder. Abdominal ascites. Aortic Atherosclerosis (ICD10-I70.0). Electronically Signed   By: Nelson Chimes M.D.   On: 03/06/2021 11:17   CT Angio Chest Pulmonary Embolism (PE) W or WO Contrast  Result Date: 04/01/2021 CLINICAL DATA:  Chest pain, shortness of breath, worsening shock, altered mental status chest EXAM: CT ANGIOGRAPHY CHEST WITH CONTRAST TECHNIQUE: Multidetector CT imaging of the chest was performed using the standard protocol during bolus administration of intravenous contrast. Multiplanar CT image reconstructions and MIPs were obtained to evaluate the vascular anatomy. CONTRAST:  119m OMNIPAQUE IOHEXOL 350 MG/ML SOLN IV COMPARISON:  CT chest 02/21/2021 FINDINGS: Cardiovascular: Atherosclerotic calcifications aorta, proximal great vessels and coronary arteries. Aorta normal caliber without aneurysm or dissection. Heart unremarkable. No pericardial effusion. Pulmonary arteries adequately opacified and patent. Lower lobe pulmonary arteries suboptimally assessed due to degree of atelectasis. No definite evidence of pulmonary embolism. Mediastinum/Nodes: Esophagus unremarkable. No thoracic  adenopathy. Base of cervical region normal appearance. Stranding in anterior mediastinum consistent with recent median sternotomy. Lungs/Pleura: BILATERAL pleural effusions. RIGHT thoracostomy tube. Extensive atelectasis versus consolidation of lower lobes. Patchy airspace infiltrates in the upper lobes bilaterally and RIGHT middle lobe, question edema versus infection. No pneumothorax. Upper Abdomen: Small amount of perihepatic free fluid. Extensive atherosclerotic calcifications. Musculoskeletal: No acute osseous findings. Review of the MIP images confirms the above findings. IMPRESSION: No definite evidence of pulmonary embolism. Moderate-sized BILATERAL pleural effusions and significant atelectasis versus consolidation of lower lobes. Mild patchy infiltrates in remaining lungs which may represent edema or infection. Stranding in anterior mediastinum consistent with recent median sternotomy. Aortic Atherosclerosis (ICD10-I70.0). Electronically Signed   By: MLavonia DanaM.D.   On: 04/01/2021 15:14   MR BRAIN WO CONTRAST  Result Date: 04/06/2021 CLINICAL DATA:  Acute neurologic deficit. EXAM: MRI HEAD WITHOUT CONTRAST TECHNIQUE: Multiplanar, multiecho pulse sequences of the brain and surrounding structures were obtained without intravenous contrast. COMPARISON:  None. FINDINGS: Brain: No acute infarct, mass effect or extra-axial collection. No acute or chronic hemorrhage. Minimal multifocal hyperintense T2-weight signal within the white matter. 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 The midline structures are normal. Vascular: Major flow voids are preserved. Skull and upper cervical spine: Normal calvarium and skull base. Visualized upper cervical spine and soft tissues are normal. Sinuses/Orbits:Bilateral mastoid effusions. Fluid in the nasopharynx. Normal orbits. IMPRESSION: 1. No acute intracranial abnormality. 2. Chronic small vessel infarcts within the centrum semiovale  bilaterally. 3. Bilateral mastoid effusions and fluid in the nasopharynx. Electronically Signed  By: Ulyses Jarred M.D.   On: 04/06/2021 19:30   US Abdomen Complete  Result Date: 03/22/2021 CLINICAL DATA:  Cirrhosis.  Ascites. EXAM: ABDOMEN ULTRASOUND COMPLETE COMPARISON:  CT chest report 11/24/2014. FINDINGS: Gallbladder: Gallstones measuring up to 8 mm noted. Gallbladder wall is thickened at 3.5 mm. This could be from hypoproteinemic states. Cholecystitis cannot be excluded. Negative Murphy sign. Common bile duct: Diameter: 4.5 mm Liver: Nodular hepatic parenchymal pattern with increased echogenicity consistent the patient's known cirrhosis. No focal hepatic abnormality identified. Portal vein is patent on color Doppler imaging with normal direction of blood flow towards the liver. IVC: No abnormality visualized. Pancreas: Visualized portion unremarkable. Spleen: Punctate echogenic foci most likely calcified granulomas. Spleen size normal. Right Kidney: Length: 11.3 cm. Suboptimal visualization due to overlying bowel gas. Increased echogenicity consistent chronic medical renal disease. No focal abnormality identified. No hydronephrosis. Left Kidney: Length: 10.4 cm. Suboptimal visualization due to overlying bowel gas. Increased echogenicity consistent chronic medical renal disease. No focal abnormality identified. No hydronephrosis. Abdominal aorta: No aneurysm visualized. Other findings: Moderate ascites. IMPRESSION: 1. Gallstones measuring up to 8 mm noted. Gallbladder wall is thickened at 3.5 mm. This could be from hypoproteinemic states. Cholecystitis cannot be excluded. Negative Murphy sign. 2. Nodular hepatic parenchymal pattern with increased echogenicity consistent with the patient's known cirrhosis. No focal hepatic abnormality identified. Portal vein is patent with normal direction of flow. 3.  Moderate ascites. Electronically Signed   By: Marcello Moores  Register   On: 02/26/2021 05:11   CARDIAC  CATHETERIZATION  Result Date: 03/08/2021 1. No evidence for left to right shunt. 2. Mildly elevated PCWP, moderately elevated RA pressure. 3. 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 4. Preserved (but not markedly high) cardiac output. 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 Would be reasonable to add sildenafil 20 mg tid.  Needs fluid removal, due for HD today.   DG CHEST PORT 1 VIEW  Result Date: 04/07/2021 CLINICAL DATA:  Intubation EXAM: PORTABLE CHEST 1 VIEW COMPARISON:  04/06/2021 FINDINGS: Support Apparatus: --Endotracheal tube: Tip at the level of the clavicular heads. --Enteric tube:Tip and sideport are below the field of view. --Vascular catheter(s):None --Other: None Left-greater-than-right bibasilar opacities. IMPRESSION: Endotracheal tube tip at the level of the clavicular heads. Unchanged left basilar opacity. Electronically Signed   By: Ulyses Jarred M.D.   On: 04/07/2021 20:43   DG Chest Port 1 View  Result Date: 04/06/2021 CLINICAL DATA:  Check endotracheal tube placement EXAM: PORTABLE CHEST 1 VIEW COMPARISON:  04/04/2021 FINDINGS: Cardiac shadow is stable. Endotracheal tube is seen in satisfactory position. Feeding catheter extends into the stomach. Patchy airspace opacity is noted primarily on the left increased when compared with the prior exam. Associated pleural effusion is seen. Additionally some vascular congestion is noted as well. Postsurgical changes are seen. No pneumothorax is noted. IMPRESSION: Slight increase in the degree of patchy infiltrate within the left lung. Left effusion is stable. Tubes and lines in satisfactory position. Electronically Signed   By: Inez Catalina M.D.   On: 04/06/2021 08:08   DG CHEST PORT 1 VIEW  Result Date: 04/04/2021 CLINICAL DATA:  ET tube EXAM: PORTABLE CHEST 1 VIEW COMPARISON:  04/04/2021 FINDINGS: Endotracheal tube remains in place with the tip approximately 6 cm  above the carina. Prior CABG. Small left pleural effusion. Left lower lobe atelectasis or infiltrate. Heart is normal size. Right lung clear. IMPRESSION: Endotracheal tube approximately 6 cm above the carina. Left pleural effusion  with left lower lobe atelectasis or infiltrate. Electronically Signed   By: Rolm Baptise M.D.   On: 04/04/2021 22:09   DG CHEST PORT 1 VIEW  Result Date: 04/04/2021 CLINICAL DATA:  Status post CABG. EXAM: PORTABLE CHEST 1 VIEW COMPARISON:  04/03/2021 FINDINGS: A feeding tube extends beyond the inferior aspect of the film. Endotracheal tube is poorly visualized, likely terminating at the level of the clavicles, approximately 6.5 cm above the carina. Prior median sternotomy. Numerous leads and wires project over the chest. Midline trachea. Mild cardiomegaly. Small left pleural effusion is similar. No pneumothorax. Diffuse pulmonary interstitial thickening. Improved left base airspace disease. IMPRESSION: Similar small left pleural effusion and adjacent Airspace disease, likely atelectasis. No pneumothorax or other acute superimposed finding. Electronically Signed   By: Abigail Miyamoto M.D.   On: 04/04/2021 08:03   DG CHEST PORT 1 VIEW  Result Date: 04/03/2021 CLINICAL DATA:  Respiratory dependent, evaluate endotracheal tube EXAM: PORTABLE CHEST 1 VIEW COMPARISON:  04/02/2021 FINDINGS: There is an endotracheal tube overlying the midthoracic trachea. There is a nasogastric tube coursing below the diaphragm, tip excluded by collimation. Right neck catheter has been removed since the prior exam. Unchanged cardiomediastinal silhouette. There is a small left pleural effusion with adjacent left basilar consolidation. Small right pleural effusion. Diffuse mild interstitial opacities. There is no visible pneumothorax. Bones are unchanged. IMPRESSION: Persistent left greater than right pleural effusions with adjacent basilar atelectasis. Lines and tubes as described above. Right neck catheter has  been removed since the prior exam. Electronically Signed   By: Maurine Simmering   On: 04/03/2021 10:10   DG CHEST PORT 1 VIEW  Result Date: 04/02/2021 CLINICAL DATA:  Hypoxia EXAM: PORTABLE CHEST 1 VIEW COMPARISON:  April 01, 2021 FINDINGS: Endotracheal tube tip is 3.8 cm above the carina. Nasogastric tube tip and side port are below the diaphragm. Central catheter tip in superior vena cava. No pneumothorax. There is a left pleural effusion with atelectasis in the left lower lung region. There is been significant interval clearing on the right with right lung essentially clear. Heart size and pulmonary vascularity are normal. Patient is status post median sternotomy. No adenopathy. No bone lesions. IMPRESSION: Tube and catheter positions as described without pneumothorax. Right lung now clear. There is a left pleural effusion with left base atelectasis. Stable cardiac silhouette. Electronically Signed   By: Lowella Grip III M.D.   On: 04/02/2021 08:03   DG CHEST PORT 1 VIEW  Result Date: 04/01/2021 CLINICAL DATA:  Intubation EXAM: PORTABLE CHEST 1 VIEW COMPARISON:  Portable exam 1445 hours compared to 04/01/2021 FINDINGS: Tip of endotracheal tube projects 5.3 cm above carina. Nasogastric tube extends into stomach. RIGHT jugular line with tip projecting over confluence of SVC. External pacing leads present. Normal heart size post median sternotomy. Bibasilar effusions and atelectasis greater on RIGHT. No pneumothorax or segmental infiltrate. IMPRESSION: Bibasilar pleural effusions and atelectasis greater on RIGHT. Electronically Signed   By: Lavonia Dana M.D.   On: 04/01/2021 14:55   DG Chest Port 1 View  Result Date: 04/01/2021 CLINICAL DATA:  Chest tube.  Open-heart surgery. EXAM: PORTABLE CHEST 1 VIEW COMPARISON:  03/31/2021. FINDINGS: Right IJ sheath in stable position. Right chest tube appears to be in stable position. Lower portion of the tube not imaged. Prior CABG. Cardiomegaly. Progressive diffuse  bilateral pulmonary infiltrates/edema. Progressive small bilateral pleural effusions. No pneumothorax. IMPRESSION: 1. Right IJ sheath in stable position. Right chest tube appears to be in stable position. Lower portion  of the tube not imaged. No pneumothorax. 2. Prior CABG. Cardiomegaly. Progressive diffuse bilateral pulmonary infiltrates/edema. Progressive small bilateral pleural effusions. Findings consistent with CHF. Electronically Signed   By: Marcello Moores  Register   On: 04/01/2021 08:02   DG Chest Port 1 View  Result Date: 03/31/2021 CLINICAL DATA:  Chest tube.  Open-heart surgery. EXAM: PORTABLE CHEST 1 VIEW COMPARISON:  03/30/2021. FINDINGS: Interval removal of mediastinal drainage catheter and left chest tube. Right chest tube in stable position. Right IJ sheath in stable position. Prior CABG. Stable cardiomegaly. Progressive bilateral pulmonary infiltrates/edema. Small bilateral pleural effusions. No pneumothorax. IMPRESSION: 1. Interim removal of venous dental drainage catheter left chest tube. Right chest tube in stable position. No pneumothorax. 2. Prior CABG. Cardiomegaly. Progressive bilateral pulmonary infiltrates/edema. Small bilateral pleural effusions. Findings suggest CHF. Electronically Signed   By: Marcello Moores  Register   On: 03/31/2021 07:01   DG Chest Port 1 View  Result Date: 03/30/2021 CLINICAL DATA:  Chest tube. EXAM: PORTABLE CHEST 1 VIEW COMPARISON:  03/28/2021. FINDINGS: Right IJ sheath, mediastinal drainage catheter, bilateral chest tubes in stable position. Prior CABG. Cardiomegaly. Left lung infiltrate again noted. Small left pleural effusion again noted. Previously identified tiny right pneumothorax no longer identified. Tiny left apical pneumothorax cannot be completely excluded on today's exam. IMPRESSION: 1. Lines and tubes including bilateral chest tubes in stable position. Previously identified tiny right pneumothorax no longer identified. Tiny left apical pneumothorax cannot be  completely excluded on today's exam. 2.  Prior CABG.  Stable cardiomegaly. 3. Left lung infiltrate again noted. Small left pleural effusion again noted. Electronically Signed   By: Marcello Moores  Register   On: 03/30/2021 06:54   DG CHEST PORT 1 VIEW  Result Date: 03/28/2021 CLINICAL DATA:  Pneumonia EXAM: PORTABLE CHEST 1 VIEW COMPARISON:  March 27, 2021 FINDINGS: Chest tubes and mediastinal drain unchanged. Pigtail catheter on the right unchanged in position. Small lateral pneumothorax on the right unchanged without tension component. Swan-Ganz catheter has been removed. Cordis tip in superior vena cava. Enteric tube has been removed. Left mid lower lung airspace opacity with small left pleural effusion remains. Right lung is clear. Heart is enlarged with pulmonary vascularity normal. Status post median sternotomy. No bone lesions. No adenopathy. IMPRESSION: Tube and catheter positions as described. Small lateral right pneumothorax without tension component again noted, stable. Airspace opacity consistent with pneumonia on the left again noted with small left pleural effusion. Right lung clear. Stable cardiomegaly. Electronically Signed   By: Lowella Grip III M.D.   On: 03/28/2021 11:33   DG Chest Port 1 View  Result Date: 03/27/2021 CLINICAL DATA:  Status post extubation. Recent coronary artery bypass grafting EXAM: PORTABLE CHEST 1 VIEW COMPARISON:  March 25, 2021 FINDINGS: Endotracheal tube has been removed. Feeding tube tip is below the diaphragm. Swan-Ganz catheter tip is in the main pulmonary outflow tract. Mediastinal drain present. Chest tube evident on each side. There is a pigtail catheter on the right with the tip either in the right base hemithorax or right upper abdomen. There is a small right lateral pneumothorax that tension component. There is a left pleural effusion with ill-defined airspace opacity in the left mid and lower lung regions, increased from recent study. Right lung is clear. There is  cardiomegaly with postoperative changes. The pulmonary vascularity is normal. No adenopathy. No bone lesions. IMPRESSION: Tube and catheter positions as described. Small right lateral pneumothorax without tension component. Left pleural effusion with suspected pneumonia in portions of the left mid and lower  lung regions. Right lung clear. Stable cardiomegaly. Electronically Signed   By: Lowella Grip III M.D.   On: 03/27/2021 08:47   DG CHEST PORT 1 VIEW  Result Date: 03/25/2021 CLINICAL DATA:  Chest tube in place.  History of open heart surgery. EXAM: PORTABLE CHEST 1 VIEW COMPARISON:  March 24, 2021. FINDINGS: Endotracheal tube tip projects approximately 4.5 cm above the carina. Right IJ approach Swan-Ganz catheter, bilateral chest tubes, and mediastinal drain are in similar position. Enteric tube courses below the diaphragm in outside the field of view. Improved aeration of the right lung base with streaky left basilar opacities. Improved right pleural effusion. No visible pneumothorax on this single AP semi erect radiograph. IMPRESSION: 1. Improved right pleural effusion and right basilar opacities. Mild streaky left basilar opacities, most likely atelectasis in the postoperative setting. 2. Support devices, as detailed above. Electronically Signed   By: Margaretha Sheffield MD   On: 03/25/2021 06:55   DG CHEST PORT 1 VIEW  Result Date: 04/18/2021 CLINICAL DATA:  Status post open heart surgery. EXAM: PORTABLE CHEST 1 VIEW COMPARISON:  04/18/2021. FINDINGS: Endotracheal tube, Swan-Ganz catheter, mediastinal drainage catheter, left chest tube in stable position. No pneumothorax. Prior CABG. Heart size stable. Prominent right lower lobe atelectasis and consolidation noted on today's exam. Small right pleural effusion. No pneumothorax. Mild gastric distention. IMPRESSION: 1.  Lines and tubes in stable position. 2.  Prior CABG.  Heart size stable. 3. Prominent right lower lobe atelectasis and consolidation noted  on today's exam. Small right pleural effusion. 3.  Mild gastric distention. Electronically Signed   By: Marcello Moores  Register   On: 04/22/2021 07:44   DG CHEST PORT 1 VIEW  Result Date: 04/19/2021 CLINICAL DATA:  Intubated EXAM: PORTABLE CHEST 1 VIEW COMPARISON:  03/18/2021, 03/22/2021, CT 03/07/2021 FINDINGS: Right IJ Swan-Ganz catheter with tip overlying the main pulmonary artery confluence. Endotracheal tube tip about 4.1 cm superior to the carina. Post sternotomy changes in addition to chest and mediastinal drainage catheters. Cardiomegaly with vascular congestion. No pleural effusion, focal airspace disease, or pneumothorax. Esophageal tube has been removed. IMPRESSION: 1. Endotracheal tube tip about 4.1 cm superior to carina 2. Removal of esophageal tube 3. Cardiomegaly with vascular congestion Electronically Signed   By: Donavan Foil M.D.   On: 03/29/2021 02:30   DG Chest Port 1 View  Result Date: 03/20/2021 CLINICAL DATA:  Status post CABG x4. EXAM: PORTABLE CHEST 1 VIEW COMPARISON:  Mar 22, 2021. FINDINGS: Status post CABG with median sternotomy. Endotracheal tube tip at the level of the clavicular heads. Right IJ approach Swan-Ganz catheter with the tip projecting at the expected location of the main pulmonary artery. Bilateral chest tubes with tips projecting along the lateral lungs bilaterally. Gastric tube courses below the diaphragm with the tip outside the view. Side port projects at the expected location of the stomach. Mediastinal drain. Opacity along the lateral left midlung in the region of the left chest tube tip, which may reflect contusion or atelectasis. No visible pleural effusions or pneumothorax on this single AP semi-erect radiograph. IMPRESSION: 1. Status post CABG with support devices as detailed above. 2. Opacity along the lateral left midlung in the region of the left chest tube tip, which may reflect contusion or atelectasis. Recommend attention on follow-up Electronically Signed    By: Margaretha Sheffield MD   On: 03/14/2021 13:18   DG Chest Port 1 View  Result Date: 03/22/2021 CLINICAL DATA:  Chest pain EXAM: PORTABLE CHEST 1 VIEW COMPARISON:  03/03/2021 FINDINGS: Cardiac shadow is at the upper limits of normal in size. Aortic calcifications are noted. Lungs are well aerated bilaterally. No focal infiltrate or effusion is seen. No bony abnormality is noted. Upper abdomen appears within normal limits. IMPRESSION: No acute abnormality noted. Aortic Atherosclerosis (ICD10-I70.0). Electronically Signed   By: Inez Catalina M.D.   On: 03/22/2021 17:08   DG Abd Portable 1V  Result Date: 04/04/2021 CLINICAL DATA:  Postop.  Follow-up. EXAM: PORTABLE ABDOMEN - 1 VIEW COMPARISON:  CT 04/03/2021.  Plain film of 04/02/2021 FINDINGS: Feeding tube has been minimally advanced, terminating at the antrum/pyloric region. A left femoral catheter is identified. Nonobstructive bowel gas pattern. Vascular calcifications. IMPRESSION: Feeding tube terminating at the gastric antrum or pylorus. Electronically Signed   By: Abigail Miyamoto M.D.   On: 04/04/2021 08:04   DG Abd Portable 1V  Result Date: 04/02/2021 CLINICAL DATA:  Status post feeding tube placement EXAM: PORTABLE ABDOMEN - 1 VIEW COMPARISON:  One day prior FINDINGS: Nasogastric tube has been removed. Feeding tube terminates at the distal stomach. Prior median sternotomy. Mild cardiomegaly. Small left pleural effusion. Grossly nonobstructive bowel gas pattern. IMPRESSION: Feeding tube terminating at the distal stomach. Electronically Signed   By: Abigail Miyamoto M.D.   On: 04/02/2021 12:40   DG Abd Portable 1V  Result Date: 04/13/2021 CLINICAL DATA:  Status post feeding tube placement. EXAM: PORTABLE ABDOMEN - 1 VIEW COMPARISON:  None. FINDINGS: Feeding tube tip is in the distal stomach directed toward the duodenum. Moderate gaseous distention of the stomach noted. IMPRESSION: As above. Electronically Signed   By: Inge Rise M.D.   On: 04/02/2021  13:15   EEG adult  Result Date: 04/07/2021 Lora Havens, MD     04/07/2021  8:19 PM Patient Name: David Congdon. MRN: [id number] Epilepsy Attending: Lora Havens Referring Physician/Provider: Hillery Aldo, NP Date: 04/07/2021 Duration: 44 mins Patient history: 55 yo M with sudden onset of unresponsiveness with no clear etiology. EEG to evaluate for status epilepticus. Level of alertness:  lethargic AEDs during EEG study: None Technical aspects: This EEG was obtained using a 10 lead EEG system positioned circumferentially without any parasagittal coverage (rapid EEG). Computer selected EEG is reviewed as well as background features and all clinically significant events. Description: EEG showed continuous generalized low amplitude 3 to 6 Hz theta-delta slowing.Hyperventilation and photic stimulation were not performed.   ABNORMALITY - Continuous slow, generalized IMPRESSION: This study is suggestive of moderate to severe diffuse encephalopathy, nonspecific etiology. No seizures or epileptiform discharges were seen throughout the recording. Dr Rory Percy was notified. Lora Havens   EEG adult  Result Date: 04/01/2021 Lora Havens, MD     04/01/2021  6:01 PM Patient Name: David Coburn. MRN: [id number] Epilepsy Attending: Lora Havens Referring Physician/Provider: Dr Noemi Chapel Date: 04/01/2021 Duration: 27.16 mins  Patient history: 55yo M s/p cardiac arrest, ws improving but had an episode of sudden onset unresponsiveness. EEG to evaluate for seizure  Level of alertness: awake  AEDs during EEG study: None  Technical aspects: This EEG study was done with scalp electrodes positioned according to the 10-20 International system of electrode placement. Electrical activity was acquired at a sampling rate of '500Hz'$  and reviewed with a high frequency filter of '70Hz'$  and a low frequency filter of '1Hz'$ . EEG data were recorded continuously and digitally stored.  Description: EEG showed continuous  generalized 5 to 6 Hz theta as well as intermittent 2-'3Hz'$  delta slowing. Hyperventilation  and photic stimulation were not performed.     ABNORMALITY - Continuous slow, generalized  IMPRESSION: This study is suggestive of moderate diffuse encephalopathy, nonspecific etiology. No seizures or epileptiform discharges were seen throughout the recording.    Lora Havens    EEG adult  Result Date: 04/07/2021 Lora Havens, MD     04/19/2021  3:47 PM Patient Name: David Setters. MRN: [id number] Epilepsy Attending: Lora Havens Referring Physician/Provider: Dr Ina Homes Date: 03/25/2021 Duration: 23.04 mins Patient history: 55yo M s/p cardiac arrest. EEG to evaluate for seizure Level of alertness: lethargic AEDs during EEG study: None Technical aspects: This EEG study was done with scalp electrodes positioned according to the 10-20 International system of electrode placement. Electrical activity was acquired at a sampling rate of '500Hz'$  and reviewed with a high frequency filter of '70Hz'$  and a low frequency filter of '1Hz'$ . EEG data were recorded continuously and digitally stored. Description: EEG showed continuous generalized 3 to 6 Hz theta-delta slowing.  Hyperventilation and photic stimulation were not performed.   Patient was noted to have head jerking and bilateral shoulder twitching. Concomitant EEG change before, during and after the event didn't show any EEG change to suggest seizure. ABNORMALITY - Continuous slow, generalized IMPRESSION: This study is suggestive of moderate to severe diffuse encephalopathy, nonspecific etiology. No seizures or epileptiform discharges were seen throughout the recording. Patient was noted to have head jerking and bilateral shoulder twitching without concomitant EEG change and was most likely NOT epileptic. Lora Havens   ECHOCARDIOGRAM COMPLETE  Result Date: 04/01/2021    ECHOCARDIOGRAM REPORT   Patient Name:   David Anuszewski. Date of Exam: 04/01/2021 Medical  Rec #:  [id number]           Height:       74.0 in Accession #:    [id number]          Weight:       172.6 lb Date of Birth:  April 13, 1966            BSA:          2.042 m Patient Age:    55 years            BP:           90/50 mmHg Patient Gender: M                   HR:           97 bpm. Exam Location:  Inpatient Procedure: 2D Echo, Color Doppler and Cardiac Doppler Indications:    CHF  History:        Patient has prior history of Echocardiogram examinations.  Sonographer:    NA Referring Phys: Martin  1. Left ventricular ejection fraction, by estimation, is 45 to 50%. The left ventricle has mildly decreased function. The left ventricle demonstrates regional wall motion abnormalities with basal to mid inferolateral severe hypokinesis. There is mild left ventricular hypertrophy. Left ventricular diastolic parameters are indeterminate due to atrial flutter.  2. Right ventricular systolic function is moderately reduced. The right ventricular size is mildly enlarged. Mildly increased right ventricular wall thickness.  3. Left atrial size was moderately dilated.  4. Right atrial size was mildly dilated.  5. The mitral valve is normal in structure. No evidence of mitral valve regurgitation. No evidence of mitral stenosis.  6. The aortic valve is tricuspid. Aortic valve regurgitation is not visualized. Mild  aortic valve sclerosis is present, with no evidence of aortic valve stenosis.  7. The inferior vena cava is dilated in size with <50% respiratory variability, suggesting right atrial pressure of 15 mmHg. FINDINGS  Left Ventricle: Left ventricular ejection fraction, by estimation, is 45 to 50%. The left ventricle has mildly decreased function. The left ventricle demonstrates regional wall motion abnormalities. The left ventricular internal cavity size was normal in size. There is mild left ventricular hypertrophy. Left ventricular diastolic parameters are indeterminate. Right Ventricle: The right  ventricular size is mildly enlarged. Mildly increased right ventricular wall thickness. Right ventricular systolic function is moderately reduced. Left Atrium: Left atrial size was moderately dilated. Right Atrium: Right atrial size was mildly dilated. Pericardium: Trivial pericardial effusion is present. Mitral Valve: The mitral valve is normal in structure. There is mild calcification of the mitral valve leaflet(s). Mild mitral annular calcification. No evidence of mitral valve regurgitation. No evidence of mitral valve stenosis. Tricuspid Valve: The tricuspid valve is normal in structure. Tricuspid valve regurgitation is trivial. Aortic Valve: The aortic valve is tricuspid. Aortic valve regurgitation is not visualized. Mild aortic valve sclerosis is present, with no evidence of aortic valve stenosis. Aortic valve mean gradient measures 5.0 mmHg. Aortic valve peak gradient measures 8.4 mmHg. Aortic valve area, by VTI measures 2.41 cm. Pulmonic Valve: The pulmonic valve was normal in structure. Pulmonic valve regurgitation is not visualized. Aorta: The aortic root is normal in size and structure. Venous: The inferior vena cava is dilated in size with less than 50% respiratory variability, suggesting right atrial pressure of 15 mmHg. IAS/Shunts: No atrial level shunt detected by color flow Doppler.  LEFT VENTRICLE PLAX 2D LVIDd:         4.10 cm  Diastology LVIDs:         3.80 cm  LV e' medial:    8.16 cm/s LV PW:         0.70 cm  LV E/e' medial:  14.0 LV IVS:        1.30 cm  LV e' lateral:   7.51 cm/s LVOT diam:     2.10 cm  LV E/e' lateral: 15.2 LV SV:         53 LV SV Index:   26 LVOT Area:     3.46 cm  RIGHT VENTRICLE            IVC RV Basal diam:  3.60 cm    IVC diam: 2.70 cm RV Mid diam:    3.50 cm RV S prime:     6.05 cm/s TAPSE (M-mode): 1.0 cm LEFT ATRIUM             Index       RIGHT ATRIUM           Index LA diam:        2.90 cm 1.42 cm/m  RA Area:     28.70 cm LA Vol (A2C):   83.0 ml 40.65 ml/m RA  Volume:   105.00 ml 51.43 ml/m LA Vol (A4C):   53.2 ml 26.06 ml/m LA Biplane Vol: 66.5 ml 32.57 ml/m  AORTIC VALVE AV Area (Vmax):    2.41 cm AV Area (Vmean):   2.10 cm AV Area (VTI):     2.41 cm AV Vmax:           145.00 cm/s AV Vmean:          100.000 cm/s AV VTI:  0.218 m AV Peak Grad:      8.4 mmHg AV Mean Grad:      5.0 mmHg LVOT Vmax:         101.00 cm/s LVOT Vmean:        60.700 cm/s LVOT VTI:          0.152 m LVOT/AV VTI ratio: 0.70  AORTA Ao Root diam: 3.00 cm Ao Asc diam:  2.80 cm MITRAL VALVE                TRICUSPID VALVE MV Area (PHT): 4.54 cm     TR Peak grad:   45.4 mmHg MV Decel Time: 167 msec     TR Vmax:        337.00 cm/s MV E velocity: 114.00 cm/s                             SHUNTS                             Systemic VTI:  0.15 m                             Systemic Diam: 2.10 cm Loralie Champagne MD Electronically signed by Loralie Champagne MD Signature Date/Time: 04/01/2021/2:37:18 PM    Final    ECHO INTRAOPERATIVE TEE  Result Date: 03/19/2021  *INTRAOPERATIVE TRANSESOPHAGEAL REPORT *  Patient Name:   David Beere. Date of Exam: 02/24/2021 Medical Rec #:  [id number]           Height:       74.0 in Accession #:    [id number]          Weight:       157.4 lb Date of Birth:  Nov 29, 1965            BSA:          1.96 m Patient Age:    28 years            BP:           112/54 mmHg Patient Gender: M                   HR:           54 bpm. Exam Location:  Anesthesiology Transesophogeal exam was perform intraoperatively during surgical procedure. Patient was closely monitored under general anesthesia during the entirety of examination. Indications:     CAD Sonographer:     Dustin Flock RDCS Performing Phys: Suella Broad MD Diagnosing Phys: Suella Broad MD Complications: No known complications during this procedure. POST-OP IMPRESSIONS     s/p CABG x4 - Left Ventricle: LVEF 40 - 45%, diffuse hypokinesis with no RWMA's. CO > 4L/min, CI > 2.5L/min/m2. - Right Ventricle: Mildly improved  with vasopressor support. - Aorta: No dissection noted after cannula removed. Stable plaque burden. - Left Atrium: The left atrium appears unchanged from pre-bypass.  - Aortic Valve: The aortic valve appears unchanged from pre-bypass. - Mitral Valve: The mitral valve appears unchanged from pre-bypass. - Tricuspid Valve: The tricuspid valve appears unchanged from pre-bypass.  PRE-OP FINDINGS  Left Ventricle: The left ventricle has mild-moderately reduced systolic function, with an ejection fraction of 40-45%. The cavity size was mildly dilated. The left ventricular wall thickness was not assessed. Left ventrical global hypokinesis without regional wall motion  abnormalities. There is no left ventricular hypertrophy. Left ventricular diastolic function could not be evaluated. Right Ventricle: The right ventricle has mildly reduced systolic function. The cavity was dialated. There is no increase in right ventricular wall thickness. There is no aneurysm seen. Left Atrium: Left atrial size was normal in size. No left atrial/left atrial appendage thrombus was detected. The left atrial appendage is well visualized and there is no evidence of thrombus present. Right Atrium: Right atrial size was normal in size. PA catheter traversing the right atrium into the right ventricle. Interatrial Septum: No atrial level shunt detected by color flow Doppler. There is no evidence of a patent foramen ovale. Pericardium: There is no evidence of pericardial effusion. There is a pleural effusion in the left lateral region. Mitral Valve: The mitral valve is normal in structure. Mitral valve regurgitation is trivial by color flow Doppler. The MR jet is centrally-directed. There is no evidence of mitral valve vegetation. There is No evidence of mitral stenosis. Tricuspid Valve: The tricuspid valve was normal in structure. Tricuspid valve regurgitation is trivial by color flow Doppler. No evidence of tricuspid stenosis is present. There is no  evidence of tricuspid valve vegetation. Aortic Valve: The aortic valve is tricuspid Aortic valve regurgitation was not visualized by color flow Doppler. There is no stenosis of the aortic valve. There is no evidence of aortic valve vegetation. There is moderate thickening and mild calcification present on the aortic valve right coronary, left coronary and non-coronary cusps with normal mobility. Pulmonic Valve: The pulmonic valve was normal in structure, with normal. No evidence of pumonic stenosis. Pulmonic valve regurgitation is not visualized by color flow Doppler. Aorta: The aortic root, ascending aorta and aortic arch are normal in size and structure. There is evidence of plaque in the descending aorta; Grade II, measuring 2-45m in size. Pulmonary Artery: SGordy Councilmancatheter present on the right. The pulmonary artery is of normal size. Pulmonary hypertension is moderate. Venous: The inferior vena cava was not well visualized. Shunts: There is no evidence of an atrial septal defect.  KSuella BroadMD Electronically signed by KSuella BroadMD Signature Date/Time: 03/14/2021/1:43:52 PM    Final    VAS [place]PL[place] PRE CABG  Result Date: 03/03/2021 PREOPERATIVE VASCULAR EVALUATION Patient Name:  David Rose  Date of Exam:   03/14/2021 Medical Rec #: [id number]           Accession #:    [id number] of Birth: 301/02/1966            Patient Gender: M Patient Age:   13YEx[facility]      VAS [place] PRE CABG Referring Phys: [id number] D CLEGG --------------------------------------------------------------------------------  Indications:      Pre-CABG. Risk Factors:     Hypertension, Diabetes, coronary artery disease, PAD. Other Factors:    CHF. Comparison Study: No prior studies. Performing Technologist: HDarlin CocoRDMS RVT  Examination Guidelines: A complete evaluation includes B-mode imaging, spectral Doppler, color Doppler, and power Doppler as needed of all accessible  portions of each vessel. Bilateral testing is considered an integral part of a complete examination. Limited examinations for reoccurring indications may be performed as noted.  Right Carotid Findings: +----------+-------+-------+--------+---------------------------------+--------+           PSV    EDV    StenosisDescribe  Comments           cm/s   cm/s                                                     +----------+-------+-------+--------+---------------------------------+--------+ CCA Prox  94     13                                                       +----------+-------+-------+--------+---------------------------------+--------+ CCA Mid   85     17             focal, hyperechoic and smooth             +----------+-------+-------+--------+---------------------------------+--------+ CCA Distal76     21                                                       +----------+-------+-------+--------+---------------------------------+--------+ ICA Prox  66     17     1-39%   irregular, calcific and                                                   heterogenous                              +----------+-------+-------+--------+---------------------------------+--------+ ICA Distal106    35                                                       +----------+-------+-------+--------+---------------------------------+--------+ ECA       160                   heterogenous and irregular                +----------+-------+-------+--------+---------------------------------+--------+ Portions of this table do not appear on this page. +----------+--------+-------+--------+------------+           PSV cm/sEDV cmsDescribeArm Pressure +----------+--------+-------+--------+------------+ Subclavian190            WNL, AVF             +----------+--------+-------+--------+------------+ +---------+--------+--+--------+--+---------+  VertebralPSV cm/s58EDV cm/s17Antegrade +---------+--------+--+--------+--+---------+ Left Carotid Findings: +----------+-------+-------+--------+---------------------------------+--------+           PSV    EDV    StenosisDescribe                         Comments           cm/s   cm/s                                                     +----------+-------+-------+--------+---------------------------------+--------+  CCA Prox  107    15                                                       +----------+-------+-------+--------+---------------------------------+--------+ CCA Distal89     13                                                       +----------+-------+-------+--------+---------------------------------+--------+ ICA Prox  81     19     1-39%   calcific and heterogenous                 +----------+-------+-------+--------+---------------------------------+--------+ ICA Distal86     27                                                       +----------+-------+-------+--------+---------------------------------+--------+ ECA       102                   irregular, calcific and                                                   heterogenous                              +----------+-------+-------+--------+---------------------------------+--------+ +----------+--------+--------+----------------+------------+ SubclavianPSV cm/sEDV cm/sDescribe        Arm Pressure +----------+--------+--------+----------------+------------+           146             Multiphasic, WNL             +----------+--------+--------+----------------+------------+ +---------+--------+--+--------+--+---------+ VertebralPSV cm/s51EDV cm/s15Antegrade +---------+--------+--+--------+--+---------+  ABI Findings: +---------+------------------+-----+----------+--------+ Right    Rt Pressure (mmHg)IndexWaveform  Comment   +---------+------------------+-----+----------+--------+ Brachial                        triphasic AVF      +---------+------------------+-----+----------+--------+ PTA      255               2.01 monophasic         +---------+------------------+-----+----------+--------+ DP       255               2.01 biphasic           +---------+------------------+-----+----------+--------+ Great Toe83                0.65 Abnormal           +---------+------------------+-----+----------+--------+ +---------+------------------+-----+---------+-------+ Left     Lt Pressure (mmHg)IndexWaveform Comment +---------+------------------+-----+---------+-------+ Brachial 127                    triphasic        +---------+------------------+-----+---------+-------+ PTA      255               2.01 biphasic         +---------+------------------+-----+---------+-------+  DP       255               2.01 biphasic         +---------+------------------+-----+---------+-------+ Great Toe97                0.76 Normal           +---------+------------------+-----+---------+-------+ +-------+---------------+----------------+ ABI/TBIToday's ABI/TBIPrevious ABI/TBI +-------+---------------+----------------+ Right  [place]/0.65                         +-------+---------------+----------------+ Left   [place]/0.76                         +-------+---------------+----------------+  Right Doppler Findings: +--------+--------+-----+---------+--------+ Site    PressureIndexDoppler  Comments +--------+--------+-----+---------+--------+ Brachial             triphasicAVF      +--------+--------+-----+---------+--------+ Radial               triphasic         +--------+--------+-----+---------+--------+ Ulnar                triphasic         +--------+--------+-----+---------+--------+  Left Doppler Findings: +--------+--------+-----+---------+--------+ Site     PressureIndexDoppler  Comments +--------+--------+-----+---------+--------+ [id number]          triphasic         +--------+--------+-----+---------+--------+ Radial               triphasic         +--------+--------+-----+---------+--------+ Ulnar                triphasic         +--------+--------+-----+---------+--------+  Summary: Right Carotid: Velocities in the right ICA are consistent with a 1-39% stenosis.                The ECA appears <50% stenosed. Left Carotid: Velocities in the left ICA are consistent with a 1-39% stenosis.               The ECA appears <50% stenosed. Vertebrals:  Bilateral vertebral arteries demonstrate antegrade flow. Subclavians: Normal flow hemodynamics were seen in bilateral subclavian              arteries. Right ABI: Resting right ankle-brachial index indicates noncompressible right lower extremity arteries. The right toe-brachial index is abnormal. ABIs are unreliable. RT great toe pressure = 83 mmHg. Left ABI: Resting left ankle-brachial index indicates noncompressible left lower extremity arteries. The left toe-brachial index is normal. ABIs are unreliable. LT Great toe pressure = 97 mmHg. Right Upper Extremity: Doppler waveforms remain within normal limits with right radial compression. Doppler waveforms decrease <50% with right ulnar compression. Left Upper Extremity: Doppler waveforms remain within normal limits with left radial compression. Doppler waveforms remain within normal limits with left ulnar compression.  Electronically signed by Deitra Mayo MD on 02/27/2021 at 2:10:52 PM.    Final    CT HEAD CODE STROKE WO CONTRAST  Result Date: 04/01/2021 CLINICAL DATA:  Code stroke.  Neuro deficit, acute stroke suspected. EXAM: CT HEAD WITHOUT CONTRAST TECHNIQUE: Contiguous axial images were obtained from the base of the skull through the vertex without intravenous contrast. COMPARISON:  None. FINDINGS: Brain: No evidence of acute large  vascular territory infarction, hemorrhage, hydrocephalus, extra-axial collection or mass lesion/mass effect. Small remote appearing lacunar infarcts in bilateral frontal lobe white matter. Evaluation of posterior fossa is limited by streak artifact. Vascular: No hyperdense  vessel identified. Skull: No acute fracture. Sinuses/Orbits: Opacified anterior right ethmoid air cell with mild mucosal thickening of the ethmoid air cells. Otherwise clear sinuses. Unremarkable orbits. Other: No mastoid effusions. ASPECTS [facility] Stroke Program Early CT Score) total score (0-10 with 10 being normal): 10. IMPRESSION: 1. No evidence of acute large vascular territory infarct or acute hemorrhage. ASPECTS is 10. 2. Small remote appearing lacunar infarcts in bilateral frontal lobe white matter. An MRI could provide more sensitive evaluation for acute infarct if clinically indicated. Code stroke imaging results were communicated on 04/01/2021 at 2:06 pm to provider Dr. Quinn Axe via secure text paging. Electronically Signed   By: Margaretha Sheffield MD   On: 04/01/2021 14:07   [place] EKG SITE RITE  Result Date: 04/06/2021 If Site Rite image not attached, placement could not be confirmed due to current cardiac rhythm.  [place] EKG SITE RITE  Result Date: 04/02/2021 If Site Rite image not attached, placement could not be confirmed due to current cardiac rhythm.  CT ANGIO HEAD CODE STROKE  Result Date: 04/01/2021 CLINICAL DATA:  Neuro deficit, acute stroke suspected. EXAM: CT ANGIOGRAPHY HEAD AND NECK TECHNIQUE: Multidetector CT imaging of the head and neck was performed using the standard protocol during bolus administration of intravenous contrast. Multiplanar CT image reconstructions and MIPs were obtained to evaluate the vascular anatomy. Carotid stenosis measurements (when applicable) are obtained utilizing NASCET criteria, using the distal internal carotid diameter as the denominator. CONTRAST:  59m OMNIPAQUE IOHEXOL 350 MG/ML SOLN  COMPARISON:  None. FINDINGS: CTA NECK FINDINGS Aortic arch: Calcific atherosclerosis of the aorta. Great vessel origins are patent. Right carotid system: Predominately calcific atherosclerosis at the carotid bifurcation without greater than 50% narrowing. The internal carotid artery is small throughout its course without focal hemodynamically significant stenosis. Left carotid system: Predominately calcific atherosclerosis at the carotid bifurcation without greater than 50% narrowing. The internal carotid artery is small throughout its course without focal hemodynamically significant stenosis. Vertebral arteries: Severe stenosis of the right vertebral artery origin secondary to atherosclerosis. Skeleton: No evidence of acute abnormality on limited assessment. Other neck: Mild diffuse soft tissue swelling. Upper chest: Please see concurrent CT chest for intrathoracic findings. Review of the MIP images confirms the above findings CTA HEAD FINDINGS Anterior circulation: Bilateral calcific and paraclinoid ICA calcific atherosclerosis without evidence of flow limiting stenosis. Bilateral MCAs and ACAs are patent without evidence of proximal hemodynamically significant stenosis. No aneurysm. Posterior circulation: Small left vertebral artery. Moderate left and mild right narrowing of the intradural vertebral arteries proximally due to calcific atherosclerosis. The basilar artery and bilateral posterior cerebral arteries are patent without proximal hemodynamically significant stenosis. No aneurysm. Venous sinuses: As permitted by contrast timing, patent. Review of the MIP images confirms the above findings IMPRESSION: CTA Head: 1. No large vessel occlusion. 2. 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 CTA Neck: 1. Severe atherosclerotic narrowing of the right vertebral artery origin. 2. Bilateral carotid bifurcation atherosclerosis without greater than 50% narrowing. 3. Small internal carotid  arteries in the neck, nonspecific but potentially chronic/congenital. 4. Mild diffuse subcutaneous soft tissue swelling, suggestive of anasarca. 5. Please see concurrent CT chest for intrathoracic findings. Findings discussed with Dr. SFuller Planat 2:28 p.m. via telephone. Electronically Signed   By: FMargaretha SheffieldMD   On: 04/01/2021 14:40   CT ANGIO NECK CODE STROKE  Result Date: 04/01/2021 CLINICAL DATA:  Neuro deficit, acute stroke suspected. EXAM: CT ANGIOGRAPHY HEAD AND NECK TECHNIQUE: Multidetector CT imaging of the head and  neck was performed using the standard protocol during bolus administration of intravenous contrast. Multiplanar CT image reconstructions and MIPs were obtained to evaluate the vascular anatomy. Carotid stenosis measurements (when applicable) are obtained utilizing NASCET criteria, using the distal internal carotid diameter as the denominator. CONTRAST:  71m OMNIPAQUE IOHEXOL 350 MG/ML SOLN COMPARISON:  None. FINDINGS: CTA NECK FINDINGS Aortic arch: Calcific atherosclerosis of the aorta. Great vessel origins are patent. Right carotid system: Predominately calcific atherosclerosis at the carotid bifurcation without greater than 50% narrowing. The internal carotid artery is small throughout its course without focal hemodynamically significant stenosis. Left carotid system: Predominately calcific atherosclerosis at the carotid bifurcation without greater than 50% narrowing. The internal carotid artery is small throughout its course without focal hemodynamically significant stenosis. Vertebral arteries: Severe stenosis of the right vertebral artery origin secondary to atherosclerosis. Skeleton: No evidence of acute abnormality on limited assessment. Other neck: Mild diffuse soft tissue swelling. Upper chest: Please see concurrent CT chest for intrathoracic findings. Review of the MIP images confirms the above findings CTA HEAD FINDINGS Anterior circulation: Bilateral calcific and paraclinoid  ICA calcific atherosclerosis without evidence of flow limiting stenosis. Bilateral MCAs and ACAs are patent without evidence of proximal hemodynamically significant stenosis. No aneurysm. Posterior circulation: Small left vertebral artery. Moderate left and mild right narrowing of the intradural vertebral arteries proximally due to calcific atherosclerosis. The basilar artery and bilateral posterior cerebral arteries are patent without proximal hemodynamically significant stenosis. No aneurysm. Venous sinuses: As permitted by contrast timing, patent. Review of the MIP images confirms the above findings IMPRESSION: CTA Head: 1. No large vessel occlusion. 2. 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 CTA Neck: 1. Severe atherosclerotic narrowing of the right vertebral artery origin. 2. Bilateral carotid bifurcation atherosclerosis without greater than 50% narrowing. 3. Small internal carotid arteries in the neck, nonspecific but potentially chronic/congenital. 4. Mild diffuse subcutaneous soft tissue swelling, suggestive of anasarca. 5. Please see concurrent CT chest for intrathoracic findings. Findings discussed with Dr. SFuller Planat 2:28 p.m. via telephone. Electronically Signed   By: FMargaretha SheffieldMD   On: 04/01/2021 14:40   [place] RUQ (LIVER/GB)  Result Date: 04/03/2021 CLINICAL DATA:  Sepsis. EXAM: ULTRASOUND ABDOMEN LIMITED RIGHT UPPER QUADRANT COMPARISON:  CT scan April 02, 2021 FINDINGS: Gallbladder: Sludge in the gallbladder. The gallbladder wall measures up to 4.4 mm. No stones or Murphy's sign reported. Common bile duct: Diameter: 3.1 mm Liver: The liver demonstrates a nodular contour with no focal mass. Portal vein is patent on color Doppler imaging with normal direction of blood flow towards the liver. Other: Right pleural effusion.  Ascites. IMPRESSION: 1. Sludge in the gallbladder. Mild gallbladder wall thickening. No Murphy's sign or stone identified. 2. Nodular  contour to the liver suggesting cirrhosis. 3. Right pleural effusion and ascites. Electronically Signed   By: DDorise BullionIII M.D   On: 04/03/2021 18:13   IR Paracentesis  Result Date: 03/18/2021 INDICATION: History of congestive heart failure. Abdominal distention. Ascites. Request for diagnostic and therapeutic paracentesis. EXAM: ULTRASOUND GUIDED RIGHT LOWER QUADRANT PARACENTESIS MEDICATIONS: 1% plain lidocaine, 5 mL COMPLICATIONS: None immediate. PROCEDURE: Informed written consent was obtained from the patient after a discussion of the risks, benefits and alternatives to treatment. A timeout was performed prior to the initiation of the procedure. Initial ultrasound scanning demonstrates a large amount of ascites within the right lower abdominal quadrant. The right lower abdomen was prepped and draped in the usual sterile fashion. 1% lidocaine was used  for local anesthesia. Following this, a 19 gauge, 7-cm, Yueh catheter was introduced. An ultrasound image was saved for documentation purposes. The paracentesis was performed. The catheter was removed and a dressing was applied. The patient tolerated the procedure well without immediate post procedural complication. FINDINGS: A total of approximately 4 L of clear yellow fluid was removed. Samples were sent to the laboratory as requested by the clinical team. IMPRESSION: Successful ultrasound-guided paracentesis yielding 4 liters of peritoneal fluid. Read by: Ascencion Dike PA-C Electronically Signed   By: Miachel Roux M.D.   On: 03/18/2021 16:26    Labs:  CBC: Recent Labs    04/06/21 2054 04/07/21 0244 04/07/21 1924 04/07/21 2002 04/08/21 0427  WBC 29.4* 27.3* 29.5*  --  24.4*  HGB 10.2* 9.3* 11.7* 11.9* 10.5*  HCT 30.6* 28.2* 36.8* 35.0* 32.2*  PLT 216 195 215  --  185    COAGS: Recent Labs    03/28/21 1134 03/28/21 1620 03/29/21 0121 03/29/21 0325 03/30/21 0414 03/31/21 0356 04/01/21 1328  INR 1.4* 1.4* 1.3* 1.3* 1.4* 1.5*  1.3*  APTT 44* 45* 43* 43*  --   --   --     BMP: Recent Labs    04/07/21 0244 04/07/21 1504 04/07/21 1924 04/07/21 2002 04/08/21 0427  NA 135 135 134* 135 135  K 3.7 4.5 6.2* 4.5 5.5*  CL 104 101 99  --  100  CO2 24 27 21*  --  23  GLUCOSE 172* 124* 219*  --  123*  BUN '18 18 20  '$ --  19  CALCIUM 7.7* 7.7* 8.0*  --  8.4*  CREATININE 1.44* 1.33* 1.25*  --  1.24  GFRNONAA 57* >60 >60  --  >60    LIVER FUNCTION TESTS: Recent Labs    04/02/21 0344 04/02/21 1347 04/03/21 0021 04/03/21 1550 04/04/21 0123 04/04/21 1530 04/07/21 0129 04/07/21 1504 04/07/21 1924 04/08/21 0427  BILITOT 1.8*  --  2.3*  --  2.1*  --   --   --  2.1*  --   AST 44*  --  30  --  40  --   --   --  41  --   ALT 23  --  19  --  24  --   --   --  21  --   ALKPHOS 205*  --  200*  --  198*  --   --   --  202*  --   PROT 5.6*  --  5.3*  --  5.7*  --   --   --  7.0  --   ALBUMIN 2.2*   < > 2.0*   < > 2.1*   < > 1.9* 2.0* 2.3* 2.1*   < > = values in this interval not displayed.    TUMOR MARKERS: No results for input(s): AFPTM, CEA, CA199, CHROMGRNA in the last 8760 hours.  Assessment and Plan: Renal failure Patient with history of ESRD on HD via AVF, now admitted with critical illness, cardiogenic/hemorrhagic/septic shock in need long-term access in the setting of prolonged hospitalization and anticipated need for iHD at discharge.  Patient currently with critical illness requiring CRRT.  IR consulted for tunneled dialysis catheter placement.  WBC 24.4, recently 88.4 on 6/10.  Blood cultures positive for Staph, undergoing treatment.   Discussed with Dr. Dwaine Gale and CCM NP.  Hold on tunneled catheter placement until infection improved.   Discussed with sister over the phone who is agreeable to tunneled HD catheter  placement.   Risks and benefits discussed with the patient including, but not limited to bleeding, infection, vascular injury, pneumothorax which may require chest tube placement, air embolism  or even death  All of the patient's questions were answered, patient is agreeable to proceed. Consent signed and in chart.  Thank you for this interesting consult.  I greatly enjoyed meeting David Rose. and look forward to participating in their care.  A copy of this report was sent to the requesting provider on this date.  Electronically Signed: Docia Barrier, PA 04/08/2021, 2:59 PM   I spent a total of 40 Minutes    in face to face in clinical consultation, greater than 50% of which was counseling/coordinating care for renal failure.

## 2021-04-08 NOTE — Progress Notes (Addendum)
Patient ID: David Rose., male   DOB: 11-Jan-1966, 55 y.o.   MRN: [id number]      Advanced Heart Failure Rounding Note  PCP-Cardiologist: Carlyle Dolly, MD    Patient Profile   55 y/o male w/  history of ESRD due to DM and CHF with mid-range EF (LV EF 40-45% with moderately decreased RV systolic function and PASP 99 on 4/22 echo) as well as cirrhosis of uncertain etiology.  Based on low EF and elevated PA pressure, right and left heart cath was done in 5/22.  This showed severe 3VD, high cardiac output with moderate pulmonary hypertension but low PVR.  He is planned for CABG, but admitted pre-op for optimization.    Subjective:    CABG x 4 on 5/31 with LIMA-LAD, SVG-RCA, SVG-OM, SVG-D.   Coagulopathic post-op with multiple blood products.  Extubated post-op but developed respiratory distress => PEA arrest then VT. Reintubated.  ROSC with ACLS.  Returned to OR for mediastinal exploration 6/1.  Extubated 6/2.   6/6 Midodrine started.   6/9 AMS--> CT negative for acute bleed or ischemic CVA. Intubated. Suspected septic shock.  CTA chest showed no PE, moderate bilateral pleural effusions. Re-intubated and CVVH restarted. CT abdomen/pelvis: anasarca, bilateral pleural effusions and lung base opacities, thickened colon/terminal ileum (?due to cirrhosis, ?ileocolitis).   6/15: Extubated but later in the day had sudden onset of unresponsiveness with no clear etiology and re-intubated. EEG negative. NH3 18.   Remains intubated. Awake on vent.  Following commands.   On Norepi 8. Back on VP 0.03.   Wt stable/ at baseline. CVVH UF net 100 cc/ hr   Finished  Flagyl and Eraxis. Remains on cefepime for HCAP/septic shock.  WBCs 88 => 82 => 71 => 45=>37=>27=>24K.  AF. ID following.   On heparin gtt and amiodarone for AFL. HR 90s   Echo (6/9) with EF 45-50%, inferolateral severe hypokinesis, moderate RV dysfunction.    Objective:   Weight Range: 71.5 kg Body mass index is 20.24 kg/m.    Vital Signs:   Temp:  [96.5 F (35.8 C)-98.9 F (37.2 C)] 98.9 F (37.2 C) (06/16 0734) Pulse Rate:  [45-102] 80 (06/16 0700) Resp:  [16-30] 27 (06/16 0700) BP: (61-184)/(15-144) 119/52 (06/16 0700) SpO2:  [10 %-100 %] 100 % (06/16 0700) FiO2 (%):  [40 %-100 %] 50 % (06/16 0311) Weight:  [71.5 kg] 71.5 kg (06/16 0451) Last BM Date: 04/07/21  Weight change: Filed Weights   04/06/21 0442 04/07/21 0438 04/08/21 0451  Weight: 72.6 kg 72.4 kg 71.5 kg    Intake/Output:   Intake/Output Summary (Last 24 hours) at 04/08/2021 0802 Last data filed at 04/08/2021 0700 Gross per 24 hour  Intake 3410.18 ml  Output 5404 ml  Net -1993.82 ml      Physical Exam   PHYSICAL EXAM: General:  fatigued/ thin appearing WM, intubated. Awake on vent and following commands  HEENT: + ETT normal Neck: supple. no JVD. Carotids 2+ bilat; no bruits. No lymphadenopathy or thyromegaly appreciated. Cor: PMI nondisplaced. Irregular rhythm, reg rate No rubs, gallops or murmurs. Lungs: clear Abdomen: soft, nontender, nondistended. No hepatosplenomegaly. No bruits or masses. Good bowel sounds. Extremities: no cyanosis, clubbing, rash, edema Neuro:  intubated, awake and following commands    Telemetry   atypical flutter 90s   Labs    CBC Recent Labs    04/07/21 1924 04/07/21 2002 04/08/21 0427  WBC 29.5*  --  24.4*  NEUTROABS 28.0*  --   --  HGB 11.7* 11.9* 10.5*  HCT 36.8* 35.0* 32.2*  MCV 97.6  --  94.2  PLT 215  --  998   Basic Metabolic Panel Recent Labs    04/07/21 1924 04/07/21 2002 04/08/21 0427  NA 134* 135 135  K 6.2* 4.5 5.5*  CL 99  --  100  CO2 21*  --  23  GLUCOSE 219*  --  123*  BUN 20  --  19  CREATININE 1.25*  --  1.24  CALCIUM 8.0*  --  8.4*  MG 2.4  --  2.6*  PHOS 4.0  --  1.7*   Liver Function Tests Recent Labs    04/07/21 1924 04/08/21 0427  AST 41  --   ALT 21  --   ALKPHOS 202*  --   BILITOT 2.1*  --   PROT 7.0  --   ALBUMIN 2.3* 2.1*   No  results for input(s): LIPASE, AMYLASE in the last 72 hours. Cardiac Enzymes No results for input(s): CKTOTAL, CKMB, CKMBINDEX, TROPONINI in the last 72 hours.  BNP: BNP (last 3 results) Recent Labs    03/22/2021 1545  BNP 1,127.8*    ProBNP (last 3 results) No results for input(s): PROBNP in the last 8760 hours.   D-Dimer No results for input(s): DDIMER in the last 72 hours.  Hemoglobin A1C No results for input(s): HGBA1C in the last 72 hours. Fasting Lipid Panel No results for input(s): CHOL, HDL, LDLCALC, TRIG, CHOLHDL, LDLDIRECT in the last 72 hours. Thyroid Function Tests Recent Labs    04/07/21 2138  TSH 6.130*    Other results:   Imaging    DG CHEST PORT 1 VIEW  Result Date: 04/07/2021 CLINICAL DATA:  Intubation EXAM: PORTABLE CHEST 1 VIEW COMPARISON:  04/06/2021 FINDINGS: Support Apparatus: --Endotracheal tube: Tip at the level of the clavicular heads. --Enteric tube:Tip and sideport are below the field of view. --Vascular catheter(s):None --Other: None Left-greater-than-right bibasilar opacities. IMPRESSION: Endotracheal tube tip at the level of the clavicular heads. Unchanged left basilar opacity. Electronically Signed   By: Ulyses Jarred M.D.   On: 04/07/2021 20:43   EEG adult  Result Date: 04/07/2021 Lora Havens, MD     04/07/2021  8:19 PM Patient Name: David Rose. MRN: [id number] Epilepsy Attending: Lora Havens Referring Physician/Provider: Hillery Aldo, NP Date: 04/07/2021 Duration: 44 mins Patient history: 55 yo M with sudden onset of unresponsiveness with no clear etiology. EEG to evaluate for status epilepticus. Level of alertness:  lethargic AEDs during EEG study: None Technical aspects: This EEG was obtained using a 10 lead EEG system positioned circumferentially without any parasagittal coverage (rapid EEG). Computer selected EEG is reviewed as well as background features and all clinically significant events. Description: EEG showed  continuous generalized low amplitude 3 to 6 Hz theta-delta slowing.Hyperventilation and photic stimulation were not performed.   ABNORMALITY - Continuous slow, generalized IMPRESSION: This study is suggestive of moderate to severe diffuse encephalopathy, nonspecific etiology. No seizures or epileptiform discharges were seen throughout the recording. Dr Rory Percy was notified. Priyanka Barbra Sarks      Medications:     Scheduled Medications:  amiodarone  200 mg Per Tube BID   aspirin  81 mg Per Tube Daily   atorvastatin  40 mg Per Tube Daily   bisacodyl  10 mg Oral Daily   Or   bisacodyl  10 mg Rectal Daily   chlorhexidine gluconate (MEDLINE KIT)  15 mL Mouth Rinse BID   Chlorhexidine  Gluconate Cloth  6 each Topical Daily   darbepoetin (ARANESP) injection - NON-DIALYSIS  60 mcg Subcutaneous Q Thu-1800   docusate  100 mg Per Tube BID   feeding supplement (PROSource TF)  45 mL Per Tube QID   insulin aspart  0-24 Units Subcutaneous Q4H   insulin glargine  10 Units Subcutaneous Daily   ipratropium-albuterol  3 mL Nebulization TID   mouth rinse  15 mL Mouth Rinse 10 times per day   midodrine  15 mg Per Tube TID WC   multivitamin  1 tablet Per Tube QHS   pantoprazole sodium  40 mg Per Tube Daily   polyethylene glycol  17 g Per Tube Daily   sodium chloride flush  10-40 mL Intracatheter Q12H   sodium chloride flush  3 mL Intravenous Q12H   Thrombi-Pad  1 each Topical Once    Infusions:   prismasol BGK 4/2.5 500 mL/hr at 04/08/21 0345    prismasol BGK 4/2.5 300 mL/hr at 04/08/21 0314   sodium chloride Stopped (04/07/21 2351)   ceFEPime (MAXIPIME) IV Stopped (04/07/21 2347)   dexmedetomidine (PRECEDEX) IV infusion Stopped (04/08/21 0506)   dextrose 10 % and 0.45 % NaCl 40 mL/hr at 04/08/21 0700   feeding supplement (VITAL 1.5 CAL) 1,000 mL (04/07/21 1446)   fentaNYL infusion INTRAVENOUS 175 mcg/hr (04/08/21 0700)   heparin 2,950 Units/hr (04/08/21 0700)   lactated ringers     lactated ringers  Stopped (04/01/21 1902)   norepinephrine (LEVOPHED) Adult infusion 8 mcg/min (04/08/21 0700)   prismasol BGK 4/2.5 1,500 mL/hr at 04/08/21 0609   sodium phosphate  Dextrose 5% IVPB     vasopressin 0.03 Units/min (04/08/21 0700)    PRN Medications: fentaNYL (SUBLIMAZE) injection, fentaNYL (SUBLIMAZE) injection, heparin, lactated ringers, lidocaine (PF), lip balm, metoprolol tartrate, morphine injection, ondansetron (ZOFRAN) IV, oxyCODONE, sodium chloride, sodium chloride flush     Assessment/Plan   1. CAD: Severe 3VD with decreased EF.  I reviewed films with Dr. Ellyn Hack, PCI would be possible but would be difficult/high risk with multiple lesions and heavy calcification. 5/31 LIMA-LAD, SVG-RCA, SVG-OM, SVG-D. No s/s ischemic currently  - ASA 81 + atorvastatin 40 mg daily.  2. Acute/chronic HF with mid range EF => cardiogenic shock post-CABG: Suspect ischemic cardiomyopathy.  Echo 4/22 with EF 40-45% with moderately decreased RV systolic function and PASP 99, moderate TR. There was a prominent component of RV failure with RA pressure elevated out of proportion to PCWP (CVP/PCWP 0.875 on RHC) but PAPI adequate at 2.8. Post-op had PEA arrest then VT.  On 6/9, episode of altered mental status, progressive hypotension, and intubated.  Suspect septic shock, now improving.  Echo (6/9) with EF 45-50%, inferolateral severe hypokinesis, moderate RV dysfunction. On NE 8 mcg + VP 0.03.  Weight close to baseline.  - CVVH ongoing, aim to keep net even today - Continue to wean pressors as able.  - Continue midodrine 3. ESRD: Suspected due to diabetes.  Volume up post-op with multiple blood products. Now back to baseline.  - CVVH ongoing, will aim to keep net even   - Renal following  4. 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  The CO was not markedly high.  Suspect he has a component of portopulmonary  hypertension. Oxygen saturation was 99% on RA pre-op, so no evidence for hepatopulmonary syndrome.   - Sildenafil has been on hold with pressors.  5. Cirrhosis: Noted on abdominal US  from 2020.  H/o paracentesis.  Had workup at [facility] (though patient does not remember this), viral hepatitis labs negative.  They ended up think that the cirrhosis was cardiogenic.  Repeat abdominal US 5/23 w/ nodular hepatic parenchymal pattern with increased echogenicity consistent with the patient's known cirrhosis. No focal hepatic abnormality identified. Portal vein is patent. Moderate ascites. GI has seen, patient had 4L paracentesis on 5/26. - He may eventually need repeat paracentesis.  6. Coagulopathy: Post-op bleeding in setting of coagulopathy post-CABG in patient with cirrhosis.  Currently seems controlled.  Platelets dropped then recovered, doubt HIT.  7. VT: Had after PEA arrest am 6/1.  - Continue amiodarone 200 bid per tube.  8. Anemia: ABLA.   - transfused x 1 u PRBC 6/14. Hgb 7.6=>9.3=>10.3 9. Pleural effusions: ?hepatic hydrothorax.    - Left effusion on last CXR, ID recommend thoracentesis.     10. PEA arrest: Respiratory arrest post-extubation.  ROSC with CPR.  11. Acute hypoxemic respiratory failure:  Extubated 6/2, re-intubated 6/9. Self-extubated overnight 6/12 and re-intubated. Extubated 6/15 then re-intubated. - Will need Trach, management per PCCM  12. Atrial arrhythmias: Atypical atrial flutter currently.   - Continue amiodarone per tube.  - On heparin gtt - Eventual DCCV.  13. ID: Cefepime/Flagyl/Eraxis.   Suspected HCAP with septic shock.  WBC 88=>85=>41=>29=>27=>24K. Now afebrile. Cultures with Staph epidermidis 1/2, suspect contaminant.  - ID following, continue broad spectrum abx. Finished Eraxis 14. Neuro: Altered mental status 6/9 with intubation.  CT head with no bleed or ischemic CVA seen.  EEG with diffuse encephalopathy.  6/15: sudden onset of unresponsiveness with no clear  etiology. EEG unremarkable. Now alert and following commands  - neuro following   Lyda Jester, PA-C  04/08/2021 8:02 AM  Patient seen with PA, agree with above note.    Events as noted above.  He was re-intubated yesterday after episode of unresponsiveness of uncertain etiology.  NH3 normal, EEG unremarkable.    This morning, he is awake and alert on vent.  CVVH with net UF 100 cc/hr currently.  He remains on NE 8 and vasopressin 0.03.   He is still in atypical atrial flutter on heparin gtt.   General: NAD Neck: JVP 8 cm, no thyromegaly or thyroid nodule.  Lungs: Clear to auscultation bilaterally with normal respiratory effort. CV: Nondisplaced PMI.  Heart regular S1/S2, no S3/S4, no murmur.  No peripheral edema.   Abdomen: Soft, nontender, no hepatosplenomegaly, no distention.  Skin: Intact without lesions or rashes.  Neurologic: Alert and oriented x 3.  Psych: Normal affect. Extremities: No clubbing or cyanosis.  HEENT: Normal.   Plan for tracheostomy today.  Hold heparin gtt.  Slow vent wean.   Wean pressors as able.  Currently pulling 100 cc/hr net UF via CVVH.  He will get a tunneled central line today via IR (difficult venous access).  Will follow CVP off line when it is in place.   He will finish course of cefepime today.   Likely remains in AFL, repeat ECG today.  Eventual DCCV.   CRITICAL CARE Performed by: Loralie Champagne  Total critical care time: 35 minutes  Critical care time was exclusive of separately billable procedures and treating other patients.  Critical care was necessary to treat or prevent imminent or life-threatening deterioration.  Critical care was time spent personally by me on the following activities: development of treatment plan with patient and/or surrogate as well as nursing, discussions with consultants, evaluation of patient's response to treatment, examination  of patient, obtaining history from patient or surrogate, ordering and  performing treatments and interventions, ordering and review of laboratory studies, ordering and review of radiographic studies, pulse oximetry and re-evaluation of patient's condition.  Loralie Champagne 04/08/2021 8:39 AM

## 2021-04-08 NOTE — Progress Notes (Signed)
Brief Nutrition Follow-up:  Received secure chat from Pharmacist regarding current TF formula with regards to phosphorus. Phosphorus level 1.7 (L)  Pt remains on vent support, plan for trach today Remains on CRRT  Seen by [facility] RN.Pt with DTI to bilateral heels. Unstageable to sacrococcygeal area.   Tolerating Vital 1.5 TF at 65 ml/hr. TF is providing 1950 mg/125 mEq of phosphorus daily at goal rate. Important to note that pt has not been a goal rate TF this entire time. There are no TF formulas on formulary that are considered "high in phosphorus" and current TF formula is not considered to be low in phosphorus.  No plan to change TF formula at this time  Suspect persistent low phosphorus levels related to prolonged CRRT in setting of poor nutrition. Recommend aggressive repletion to cover CRRT losses  Given extensive wounds noted, plan to add: Juven BID, each packet provides 80 calories, 8 grams of carbohydrate, 2.5  grams of protein (collagen), 7 grams of L-arginine and 7 grams of L-glutamine; supplement contains CaHMB, Vitamins C, E, B12 and Zinc to promote wound healing    Kerman Passey MS, RDN, LDN, CNSC Registered Dietitian III Clinical Nutrition RD Pager and On-Call Pager Number Located in [place]

## 2021-04-08 NOTE — Progress Notes (Signed)
ANTICOAGULATION CONSULT NOTE  Pharmacy Consult for heparin Indication: atrial flutter  Allergies  Allergen Reactions   Shellfish-Derived Products Anaphylaxis   Wasp Venom Protein Anaphylaxis   Penicillins Other (See Comments)    Childhood reaction    Patient Measurements: Height: '6\' 2"'$  (188 cm) Weight: 71.5 kg (157 lb 10.1 oz) IBW/kg (Calculated) : 82.2 Heparin Dosing Weight: 79kg  Vital Signs: Temp: 97.8 F (36.6 C) (06/16 0400) Temp Source: Axillary (06/16 0400) BP: 93/56 (06/16 0630) Pulse Rate: 79 (06/16 0630)  Labs: Recent Labs    04/07/21 0244 04/07/21 1504 04/07/21 1924 04/07/21 2002 04/07/21 2138 04/08/21 0427  HGB 9.3*  --  11.7* 11.9*  --  10.5*  HCT 28.2*  --  36.8* 35.0*  --  32.2*  PLT 195  --  215  --   --  185  HEPARINUNFRC <0.10* 0.21*  --   --  0.36 0.10*  CREATININE 1.44* 1.33* 1.25*  --   --  1.24     Estimated Creatinine Clearance: 68.1 mL/min (by C-G formula based on SCr of 1.24 mg/dL).   Medical History: Past Medical History:  Diagnosis Date   ESRD (end stage renal disease) ([place])    Hypertension    Peripheral arterial disease ([place])    Type 2 diabetes mellitus ([place])    Assessment: 40 YOF with recent CABG now with postop afib/flutter. HIT level was not actually sent though a result exists in chart, ok to disregard after discussion with team.   Heparin level difficult to obtain due to limited access. 6/11 Foot stick unsuccessful. RN will draw future levels from red CRRT port.   Heparin level 0.21 on heparin infusion at 2600 units/hr (rechecked peripherally, heparin running centrally) - still below goal of 0.3 to 0.5 so will increase. Hgb 9.3 (s/p 1 PRBC 6/14), plt 195. AT low at 23 on 6/13 - team aware.   Plan for possible PEG/trach in future so will keep on heparin for now. No PICC can be placed - heparin switched to run centrally and level being drawn peripherally.  6/16 AM update:  Heparin level low Infusing ok per RN  Goal of  Therapy:  Heparin level 0.3-0.5  units/ml  Monitor platelets by anticoagulation protocol: Yes   Plan:  Increase IV Heparin to 2950 units/hr.  Re-check heparin level in 8 hours  Narda Bonds, PharmD, [place] Clinical Pharmacist Phone: [phone]

## 2021-04-08 NOTE — Progress Notes (Signed)
[facility] Progress Note Patient Name: David Rose. DOB: [birth year] MRN: [id number]   Date of Service  04/08/2021  HPI/Events of Note  Patient pulling at ETT. Nursing request for bilateral soft wrist restraints.   eICU Interventions  Will order soft bilateral wrist restraints X 9 hours.      Intervention Category Major Interventions: Delirium, psychosis, severe agitation - evaluation and management  Florentine Diekman [place] 04/08/2021, 12:28 AM

## 2021-04-08 NOTE — Progress Notes (Signed)
Neurology Progress Note  Interval HPI: Last pm, David Rose had an unresponsive episode, etiology unknown. Neurology was consulted and signed off on 04/03/21. However, last pm after patient was unresponsive and intubated, neurology was called back. Spot EEG was done without seizures. The PCCM MD called Dr. Cheral Marker after NP left and said he was concerned patient may be having intermittent absence seizures as the patient is staring off at times and not talking during the episodes. LTM has been ordered by Dr. Cheral Marker.   O: Current vital signs: BP (!) 101/52   Pulse 90   Temp 98.9 F (37.2 C) (Oral)   Resp (!) 30   Ht '6\' 2"'  (1.88 m)   Wt 71.5 kg   SpO2 100%   BMI 20.24 kg/m  Vital signs in last 24 hours: Temp:  [96.5 F (35.8 C)-98.9 F (37.2 C)] 98.9 F (37.2 C) (06/16 0734) Pulse Rate:  [45-102] 90 (06/16 0900) Resp:  [16-30] 30 (06/16 0900) BP: (61-184)/(15-144) 101/52 (06/16 0900) SpO2:  [10 %-100 %] 100 % (06/16 0900) FiO2 (%):  [50 %-100 %] 50 % (06/16 0900) Weight:  [71.5 kg] 71.5 kg (06/16 0451)  GENERAL: Awake, alert in NAD HEENT: Normocephalic and atraumatic. LUNGS: ETT on ventilator.  CV: irregular on tele.   NEURO:  Mental Status: He is awake and alert. He follows simple commands, like grip squeeze and giving a thumb's up. He is able to hold UEs up but drifts back to bed quickly bilaterally.  Speech/Language: mute, but he does mouth words.  Cranial Nerves:  II: PERRL.  III, IV, VI: eyelids elevate symmetrically.  V, VII: Furls and grimaces with brow pressure. Face is grossly symmetrical.   VIII: hearing intact to voice. IX, X: intubated.  XI: Head is grossly midline.  XII: intubated.  Motor: He squeezes NPs hands but does not comprehend triceps/biceps checking.  Tone: is normal and bulk is decreased.  Sensation- Withdraws to pain with noxious stimuli. No localization.  Cerebellar: No tremors or twitching noted.  Gait- deferred  Medications  Current  Facility-Administered Medications:     prismasol BGK 4/2.5 infusion, , CRRT, Continuous, Coladonato, Joseph, MD, Last Rate: 500 mL/hr at 04/08/21 0345, New Bag at 04/08/21 0345    prismasol BGK 4/2.5 infusion, , CRRT, Continuous, Coladonato, Broadus John, MD, Last Rate: 300 mL/hr at 04/08/21 0314, New Bag at 04/08/21 0314   0.9 %  sodium chloride infusion, , Intravenous, Continuous, Icard, Bradley L, DO, Stopped at 04/07/21 2351   amiodarone (PACERONE) tablet 200 mg, 200 mg, Per Tube, BID, Larey Dresser, MD, 200 mg at 04/07/21 2115   aspirin chewable tablet 81 mg, 81 mg, Per Tube, Daily, Wonda Olds, MD, 81 mg at 04/07/21 0919   atorvastatin (LIPITOR) tablet 40 mg, 40 mg, Per Tube, Daily, Atkins, Glenice Bow, MD, 40 mg at 04/07/21 0919   bisacodyl (DULCOLAX) EC tablet 10 mg, 10 mg, Oral, Daily, 10 mg at 04/07/21 0919 **OR** bisacodyl (DULCOLAX) suppository 10 mg, 10 mg, Rectal, Daily, Gold, Wayne E, PA-C, 10 mg at 04/21/2021 1351   ceFEPIme (MAXIPIME) 2 g in sodium chloride 0.9 % 100 mL IVPB, 2 g, Intravenous, Q12H, Comer, Okey Regal, MD, Stopped at 04/07/21 2347   chlorhexidine gluconate (MEDLINE KIT) (PERIDEX) 0.12 % solution 15 mL, 15 mL, Mouth Rinse, BID, Clark, Laura P, DO, 15 mL at 04/08/21 7014   Chlorhexidine Gluconate Cloth 2 % PADS 6 each, 6 each, Topical, Daily, Wonda Olds, MD, 6 each at 04/08/21 0500  Darbepoetin Alfa (ARANESP) injection 60 mcg, 60 mcg, Subcutaneous, Q Thu-1800, Elmarie Shiley, MD   dexmedetomidine (PRECEDEX) 400 MCG/100ML (4 mcg/mL) infusion, 0-1.2 mcg/kg/hr, Intravenous, Continuous, Estill Cotta, NP, Stopped at 04/08/21 0506   dextrose 10 % and 0.45 % NaCl infusion, , Intravenous, Continuous, Anders Simmonds, MD, Last Rate: 40 mL/hr at 04/08/21 0700, Infusion Verify at 04/08/21 0700   docusate (COLACE) 50 MG/5ML liquid 100 mg, 100 mg, Per Tube, BID, Estill Cotta, NP   etomidate (AMIDATE) injection 20 mg, 20 mg, Intravenous, Once, Jacky Kindle, MD   feeding  supplement (PROSource TF) liquid 45 mL, 45 mL, Per Tube, QID, Icard, Bradley L, DO, 45 mL at 04/07/21 2115   feeding supplement (VITAL 1.5 CAL) liquid 1,000 mL, 1,000 mL, Per Tube, Continuous, Atkins, Glenice Bow, MD, Last Rate: 65 mL/hr at 04/07/21 1446, 1,000 mL at 04/07/21 1446   fentaNYL (SUBLIMAZE) injection 25-50 mcg, 25-50 mcg, Intravenous, Q15 min PRN, Estill Cotta, NP   fentaNYL (SUBLIMAZE) injection 25-50 mcg, 25-50 mcg, Intravenous, Q30 min PRN, Estill Cotta, NP   fentaNYL 2596mg in NS 2532m(1044mml) infusion-PREMIX, 0-400 mcg/hr, Intravenous, Continuous, Icard, Bradley L, DO, Last Rate: 17.5 mL/hr at 04/08/21 0700, 175 mcg/hr at 04/08/21 0700   heparin injection 1,000-6,000 Units, 1,000-6,000 Units, CRRT, PRN, ColDonato HeinzD   insulin aspart (novoLOG) injection 0-24 Units, 0-24 Units, Subcutaneous, Q4H, AtkOrvan SeenroGlenice BowD, 2 Units at 04/08/21 0433   insulin glargine (LANTUS) injection 10 Units, 10 Units, Subcutaneous, Daily, ClaJulian HyO, 10 Units at 04/07/21 0919   ipratropium-albuterol (DUONEB) 0.5-2.5 (3) MG/3ML nebulizer solution 3 mL, 3 mL, Nebulization, TID, Atkins, BroGlenice BowD, 3 mL at 04/08/21 0818   lactated ringers infusion 500 mL, 500 mL, Intravenous, Once PRN, Gold, Wayne E, PA-C   lactated ringers infusion, , Intravenous, Continuous, Gold, Wayne E, PA-C, Stopped at 04/01/21 1902   lidocaine (PF) (XYLOCAINE) 1 % injection, , , PRN, Bruning, Kevin, PA-C, 10 mL at 03/18/21 1500   lidocaine HCl (PF) (XYLOCAINE) 2 % injection 0-20 mL, 0-20 mL, Intradermal, Once PRN, ChaTacy Learnudham, MD   lidocaine-EPINEPHrine (XYLOCAINE-EPINEPHrine) 1 %-1:200000 (PF) injection 0-30 mL, 0-30 mL, Intradermal, Once PRN, ChaJacky KindleD   lip balm (CARMEX) ointment, , Topical, PRN, AtkWonda OldsD, Given at 03/25/21 2247   MEDLINE mouth rinse, 15 mL, Mouth Rinse, 10 times per day, ClaNoemi Chapel DO, 15 mL at 04/08/21 0612   metoprolol tartrate (LOPRESSOR) injection  2.5-5 mg, 2.5-5 mg, Intravenous, Q2H PRN, Gold, Wayne E, PA-C   midazolam (VERSED) injection 4 mg, 4 mg, Intravenous, Once, Chand, Sudham, MD   midodrine (PROAMATINE) tablet 15 mg, 15 mg, Per Tube, TID WC, McLLarey DresserD, 15 mg at 04/07/21 1639   morphine 2 MG/ML injection 1-4 mg, 1-4 mg, Intravenous, Q1H PRN, Gold, Wayne E, PA-C, 4 mg at 04/02/21 [id number] (RENA-VIT) tablet 1 tablet, 1 tablet, Per Tube, QHS, Atkins, BroGlenice BowD, 1 tablet at 04/07/21 2115   norepinephrine (LEVOPHED) 16 mg in 250m22memix infusion, 0-40 mcg/min, Intravenous, Titrated, Atkins, Broadus Z, MD, Last Rate: 7.5 mL/hr at 04/08/21 0700, 8 mcg/min at 04/08/21 0700   ondansetron (ZOFRAN) injection 4 mg, 4 mg, Intravenous, Q6H PRN, Gold, Wayne E, PA-C, 4 mg at 03/31/21 [id number] (ROXICODONE) 5 MG/5ML solution 5-10 mg, 5-10 mg, Per Tube, Q3H PRN, Atkins, Broadus Z, MD   pantoprazole sodium (PROTONIX) 40 mg/20 mL oral suspension 40 mg, 40  mg, Per Tube, Daily, Icard, Bradley L, DO, 40 mg at 04/07/21 0919   polyethylene glycol (MIRALAX / GLYCOLAX) packet 17 g, 17 g, Per Tube, Daily, Orvan Seen, Glenice Bow, MD, 17 g at 04/07/21 1583   prismasol BGK 4/2.5 infusion, , CRRT, Continuous, Donato Heinz, MD, Last Rate: 1,500 mL/hr at 04/08/21 0609, New Bag at 04/08/21 0609   sodium chloride 0.9 % primer fluid for CRRT, , CRRT, PRN, Donato Heinz, MD   sodium chloride flush (NS) 0.9 % injection 10-40 mL, 10-40 mL, Intracatheter, Q12H, Atkins, Glenice Bow, MD, 10 mL at 04/07/21 0920   sodium chloride flush (NS) 0.9 % injection 3 mL, 3 mL, Intravenous, Q12H, Gold, Wayne E, PA-C, 3 mL at 04/07/21 0920   sodium chloride flush (NS) 0.9 % injection 3 mL, 3 mL, Intravenous, PRN, Gold, Wayne E, PA-C   sodium phosphate 30 mmol in dextrose 5 % 250 mL infusion, 30 mmol, Intravenous, Once, Larey Dresser, MD   Thrombi-Pad 3"X3" pad 1 each, 1 each, Topical, Once, Bowser, Laurel Dimmer, NP   vasopressin (PITRESSIN) 20 Units in  sodium chloride 0.9 % 100 mL infusion-*FOR SHOCK*, 0.03 Units/min, Intravenous, Continuous, Hillery Aldo S, NP, Last Rate: 9 mL/hr at 04/08/21 0700, 0.03 Units/min at 04/08/21 0700   vecuronium (NORCURON) injection 10 mg, 10 mg, Intravenous, Once, Jacky Kindle, MD  Pertinent Labs WBCC trending downward and ammonia normal. TSH high. B12 2, 777.   EEG: This study is suggestive of moderate to severe diffuse encephalopathy, nonspecific etiology. No seizures or epileptiform discharges were seen throughout the recording.   MRI Brain 04/06/21.   No acute intracranial abnormality. 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 Bilateral mastoid effusions and fluid in the nasopharynx.   Assessment: 55 yo male who had an episode of unresponsiveness with emergent intubation last p.m. Seizures/status epilepticus on differential, but EEG negative. Will add cEEG today due to his waxing and waning mental status. After EEG resulted, it was thought his episode of unresponsiveness was multifactorial toxic metabolic encephalopathy. Ammonia level was normal, which was reassuring. Strokes are always in the realm of possibility with AF, but he is on [facility]. Neurology MD (7p-7a) did not think r/p MRI brain would have a high yield for presentation. Note, patient has been on Cefepime since Sunday and this will end tomorrow. Cefepime has been associated with encephalopathy.   Impression: -multifactorial metabolic/toxic encephalopathy.   Plan:  -continue to correct metabolic derangements as you are doing.  -thyroid treatment per medicine team. -Will follow cEEG.  -If his mental status should worsen, may need to change Cefepime to comparable antibiotic.  -Minimize sedating medications.   Pt seen by Clance Boll, MSN, APN-BC/Nurse Practitioner/Neuro and later by MD. Note and plan to be edited as needed by MD.  Pager: [phone]   Electronically signed: Dr. Kerney Elbe

## 2021-04-08 NOTE — Procedures (Signed)
Diagnostic Bronchoscopy  David Rose  [id number]  07-31-1966  Date:04/08/21  Time:3:08 PM   Provider Performing:Chiante Peden C Tamala Julian   Procedure: Diagnostic Bronchoscopy [id number])  Indication(s) Assist with direct visualization of tracheostomy placement  Consent Risks of the procedure as well as the alternatives and risks of each were explained to the patient and/or caregiver.  Consent for the procedure was obtained.   Anesthesia See separate tracheostomy note   Time Out Verified patient identification, verified procedure, site/side was marked, verified correct patient position, special equipment/implants available, medications/allergies/relevant history reviewed, required imaging and test results available.   Sterile Technique Usual hand hygiene, masks, gowns, and gloves were used   Procedure Description Bronchoscope advanced through endotracheal tube and into airway.  After suctioning out tracheal secretions, bronchoscope used to provide direct visualization of tracheostomy placement.   Complications/Tolerance None; patient tolerated the procedure well.   EBL None  Specimen(s) None

## 2021-04-09 DIAGNOSIS — R404 Transient alteration of awareness: Secondary | ICD-10-CM

## 2021-04-09 DIAGNOSIS — R4182 Altered mental status, unspecified: Secondary | ICD-10-CM | POA: Diagnosis not present

## 2021-04-09 DIAGNOSIS — I5023 Acute on chronic systolic (congestive) heart failure: Secondary | ICD-10-CM | POA: Diagnosis not present

## 2021-04-09 DIAGNOSIS — J9601 Acute respiratory failure with hypoxia: Secondary | ICD-10-CM | POA: Diagnosis not present

## 2021-04-09 DIAGNOSIS — Z951 Presence of aortocoronary bypass graft: Secondary | ICD-10-CM | POA: Diagnosis not present

## 2021-04-09 LAB — RENAL FUNCTION PANEL
Albumin: 2.1 g/dL — ABNORMAL LOW (ref 3.5–5.0)
Albumin: 2 g/dL — ABNORMAL LOW (ref 3.5–5.0)
Anion gap: 10 (ref 5–15)
Anion gap: 7 (ref 5–15)
BUN: 21 mg/dL — ABNORMAL HIGH (ref 6–20)
BUN: 27 mg/dL — ABNORMAL HIGH (ref 6–20)
CO2: 24 mmol/L (ref 22–32)
CO2: 27 mmol/L (ref 22–32)
Calcium: 8.2 mg/dL — ABNORMAL LOW (ref 8.9–10.3)
Calcium: 8.1 mg/dL — ABNORMAL LOW (ref 8.9–10.3)
Chloride: 100 mmol/L (ref 98–111)
Chloride: 100 mmol/L (ref 98–111)
Creatinine, Ser: 1.37 mg/dL — ABNORMAL HIGH (ref 0.61–1.24)
Creatinine, Ser: 1.18 mg/dL (ref 0.61–1.24)
GFR, Estimated: >60 mL/min (ref >60)
GFR, Estimated: >60 mL/min (ref >60)
Glucose, Bld: 180 mg/dL — ABNORMAL HIGH (ref 70–99)
Glucose, Bld: 223 mg/dL — ABNORMAL HIGH (ref 70–99)
Phosphorus: 3.4 mg/dL (ref 2.5–4.6)
Phosphorus: 2.9 mg/dL (ref 2.5–4.6)
Potassium: 4.7 mmol/L (ref 3.5–5.1)
Potassium: 4.5 mmol/L (ref 3.5–5.1)
Sodium: 134 mmol/L — ABNORMAL LOW (ref 135–145)
Sodium: 134 mmol/L — ABNORMAL LOW (ref 135–145)

## 2021-04-09 LAB — GLUCOSE, CAPILLARY
Glucose-Capillary: 129 mg/dL — ABNORMAL HIGH (ref 70–99)
Glucose-Capillary: 132 mg/dL — ABNORMAL HIGH (ref 70–99)
Glucose-Capillary: 137 mg/dL — ABNORMAL HIGH (ref 70–99)
Glucose-Capillary: 179 mg/dL — ABNORMAL HIGH (ref 70–99)
Glucose-Capillary: 189 mg/dL — ABNORMAL HIGH (ref 70–99)
Glucose-Capillary: 230 mg/dL — ABNORMAL HIGH (ref 70–99)
Glucose-Capillary: 96 mg/dL (ref 70–99)

## 2021-04-09 LAB — CBC
HCT: 32.4 % — ABNORMAL LOW (ref 39.0–52.0)
Hemoglobin: 10.1 g/dL — ABNORMAL LOW (ref 13.0–17.0)
MCH: 30.7 pg (ref 26.0–34.0)
MCHC: 31.2 g/dL (ref 30.0–36.0)
MCV: 98.5 fL (ref 80.0–100.0)
Platelets: 237 10*3/uL (ref 150–400)
RBC: 3.29 MIL/uL — ABNORMAL LOW (ref 4.22–5.81)
RDW: 23.8 % — ABNORMAL HIGH (ref 11.5–15.5)
WBC: 26.8 10*3/uL — ABNORMAL HIGH (ref 4.0–10.5)
nRBC: 0.4 % — ABNORMAL HIGH (ref 0.0–0.2)

## 2021-04-09 LAB — HEPARIN LEVEL (UNFRACTIONATED): Heparin Unfractionated: 0.44 IU/mL (ref 0.30–0.70)

## 2021-04-09 LAB — MAGNESIUM: Magnesium: 2.5 mg/dL — ABNORMAL HIGH (ref 1.7–2.4)

## 2021-04-09 MED ORDER — MIDODRINE HCL 5 MG PO TABS
30.0000 mg | ORAL_TABLET | Freq: Three times a day (TID) | ORAL | Status: DC
  Administered 2021-04-09 – 2021-04-14 (×15): 30 mg
  Filled 2021-04-09 (×16): qty 6

## 2021-04-09 MED ORDER — CHLORHEXIDINE GLUCONATE 0.12 % MT SOLN
OROMUCOSAL | Status: AC
  Administered 2021-04-09: 15 mL via OROMUCOSAL
  Filled 2021-04-09: qty 15

## 2021-04-09 NOTE — Progress Notes (Signed)
SLP Cancellation Note  Patient Details Name: David Rose. MRN: [id number] DOB: Nov 29, 1965   Cancelled treatment:       Reason Eval/Treat Not Completed: Other (comment) Patient with new tracheostomy. Orders for SLP eval and treat for PMSV and swallowing received. Will follow pt closely for readiness for SLP interventions as appropriate.     David Rose, Katherene Ponto 04/09/2021, 7:56 AM

## 2021-04-09 NOTE — Progress Notes (Signed)
16 Days Post-Op Procedure(s) (LRB): EXPLORATION POST OPERATIVE OPEN HEART (N/A) Subjective: No complaints, responsive  Objective: Vital signs in last 24 hours: Temp:  [97.5 F (36.4 C)-98.8 F (37.1 C)] 98.5 F (36.9 C) (06/17 0700) Pulse Rate:  [78-104] 95 (06/17 0700) Cardiac Rhythm: Sinus tachycardia (06/17 0400) Resp:  [20-30] 24 (06/17 0700) BP: (78-132)/(35-68) 103/52 (06/17 0700) SpO2:  [97 %-100 %] 97 % (06/17 0700) FiO2 (%):  [40 %-50 %] 40 % (06/17 0400) Weight:  [71.4 kg] 71.4 kg (06/17 0335)  Hemodynamic parameters for last 24 hours:    Intake/Output from previous day: 06/16 0701 - 06/17 0700 In: 3353 [I.V.:2403.2; NG/GT:490; IV Piggyback:459.9] Out: 4778  Intake/Output this shift: No intake/output data recorded.  General appearance: alert and cooperative Neurologic: intact Heart: regular rate and rhythm, S1, S2 normal, no murmur, click, rub or gallop Wound: c/d/i  Lab Results: Recent Labs    04/08/21 0427 04/09/21 0029  WBC 24.4* 26.8*  HGB 10.5* 10.1*  HCT 32.2* 32.4*  PLT 185 237   BMET:  Recent Labs    04/08/21 1636 04/09/21 0029  NA 133* 134*  K 4.1 4.7  CL 98 100  CO2 23 24  GLUCOSE 238* 180*  BUN 20 21*  CREATININE 1.49* 1.37*  CALCIUM 8.1* 8.2*    PT/INR: No results for input(s): LABPROT, INR in the last 72 hours. ABG    Component Value Date/Time   PHART 7.406 04/07/2021 2002   HCO3 26.9 04/07/2021 2002   TCO2 28 04/07/2021 2002   ACIDBASEDEF 10.0 (H) 04/02/2021 0352   O2SAT 100.0 04/07/2021 2002   CBG (last 3)  Recent Labs    04/08/21 2340 04/09/21 0420 04/09/21 0733  GLUCAP 153* 189* 137*    Assessment/Plan: S/P Procedure(s) (LRB): EXPLORATION POST OPERATIVE OPEN HEART (N/A) Mobilize Diuresis Diabetes control HD per nephrology; vent wean per CCM Appreciate ID suggestions   LOS: 25 days    Wonda Olds 04/09/2021

## 2021-04-09 NOTE — Progress Notes (Signed)
Subjective: NAEO. S/p trach now  ROS: Unable to obtain due to recent trach and patient having trouble speaking  Examination  Vital signs in last 24 hours: Temp:  [97.5 F (36.4 C)-98.8 F (37.1 C)] 97.7 F (36.5 C) (06/17 1100) Pulse Rate:  [88-104] 100 (06/17 1100) Resp:  [17-30] 17 (06/17 1100) BP: (78-133)/(35-106) 133/106 (06/17 1100) SpO2:  [97 %-100 %] 100 % (06/17 1100) FiO2 (%):  [40 %-50 %] 40 % (06/17 0802) Weight:  [71.4 kg] 71.4 kg (06/17 0335)  General: lying in bed, NAD CVS: pulse-normal rate and rhythm RS:  +trach, coarse breath sounds BL Extremities: normal, warm Neuro: Awake, alert, able to say his name slowly, able to follow simple one step commands, PERLA, Blinks to threat BL, EOMI, no apparent facial asymmetry, spontaneously moving all extremities  Basic Metabolic Panel: Recent Labs  Lab 04/06/21 0055 04/06/21 2054 04/07/21 0244 04/07/21 1504 04/07/21 1924 04/07/21 2002 04/08/21 0427 04/08/21 1636 04/09/21 0029  NA 136   < > 135 135 134* 135 135 133* 134*  K 3.8   < > 3.7 4.5 6.2* 4.5 5.5* 4.1 4.7  CL 106   < > 104 101 99  --  100 98 100  CO2 22   < > 24 27 21*  --  '23 23 24  '$ GLUCOSE 126*   < > 172* 124* 219*  --  123* 238* 180*  BUN 12   < > '18 18 20  '$ --  19 20 21*  CREATININE 1.25*   < > 1.44* 1.33* 1.25*  --  1.24 1.49* 1.37*  CALCIUM 7.0*   < > 7.7* 7.7* 8.0*  --  8.4* 8.1* 8.2*  MG 2.0  --  2.1  --  2.4  --  2.6*  --  2.5*  PHOS 2.2*   < >  --  4.5 4.0  --  1.7* 3.1 3.4   < > = values in this interval not displayed.    CBC: Recent Labs  Lab 04/03/21 0021 04/04/21 0123 04/05/21 0401 04/06/21 2054 04/07/21 0244 04/07/21 1924 04/07/21 2002 04/08/21 0427 04/09/21 0029  WBC 82.3* 71.3*   < > 29.4* 27.3* 29.5*  --  24.4* 26.8*  NEUTROABS 75.2* 64.6*  --   --   --  28.0*  --   --   --   HGB 8.8* 9.1*   < > 10.2* 9.3* 11.7* 11.9* 10.5* 10.1*  HCT 26.2* 27.0*   < > 30.6* 28.2* 36.8* 35.0* 32.2* 32.4*  MCV 91.3 89.1   < > 92.4 93.4 97.6   --  94.2 98.5  PLT 256 291   < > 216 195 215  --  185 237   < > = values in this interval not displayed.     Coagulation Studies: No results for input(s): LABPROT, INR in the last 72 hours.  Imaging  MRI brain without contrast 04/06/2021: No acute abnormality   ASSESSMENT AND PLAN: 55 year old male with h/o hypertension, diabetes, end-stage renal disease, chronic systolic heart failure, cirrhosis, coronary artery disease, cardiac arrest status post CABG who has had at least 2 episodes of sudden unresponsiveness on 04/01/2021, 04/07/2021.   Transient unresponsiveness -LTM EEG suggestive of mild to moderate diffuse encephalopathy, nonspecific etiology  Recommendation - semiology of episodes, h/o significant cardiac comorbidities, no ictal-interictal eeg abnormality so far. Therefore, more likely these episodes are cardiogenic.  -However, patient has had more than one episode and therefore we will continue video EEG monitoring for another 24  to 48 hours to look for any ictal-interictal abnormality.  -No clear evidence of epileptogenicity so far, therefore would not start any AEDs -Continue to minimize sedation -Continue management of alcohol use per primary team  I have spent a total of 35  minutes with the patient reviewing hospital notes,  test results, labs and examining the patient as well as establishing an assessment and plan.  > 50% of time was spent in direct patient care.    David Rose Epilepsy [facility] For questions after 5pm please refer to AMION to reach the Neurologist on call

## 2021-04-09 NOTE — Procedures (Addendum)
Patient Name: David Rose.  MRN: [id number]  Epilepsy Attending: Lora Havens  Referring Physician/Provider: Dr Kerney Elbe Duration: 04/08/2021 [id number] to 04/09/2021 0934   Patient history: 55 yo M with sudden onset of unresponsiveness with no clear etiology. EEG to evaluate for status epilepticus.    Level of alertness:  lethargic   AEDs during EEG study: None   Technical aspects: This EEG study was done with scalp electrodes positioned according to the 10-20 International system of electrode placement. Electrical activity was acquired at a sampling rate of '500Hz'$  and reviewed with a high frequency filter of '70Hz'$  and a low frequency filter of '1Hz'$ . EEG data were recorded continuously and digitally stored.    Description: EEG initially showed continuous generalized low amplitude 3 to 6 Hz theta-delta slowing admixed with intermittent generalized 15 to '18hz'$  beta activity.  Gradually EEG improved and showed posterior dominant rhythm of 9-10 Hz activity of moderate voltage (25-35 uV) seen predominantly in posterior head regions, symmetric and reactive to eye opening and eye closing.  Intermittent generalized 3 to 6 Hz theta- delta slowing was also noted.  Hyperventilation and photic stimulation were not performed.      IMPRESSION: This study was initially suggestive of moderate diffuse encephalopathy, nonspecific etiology which gradually improved to mild diffuse encephalopathy. No seizures or epileptiform discharges were seen throughout the recording.   [name]

## 2021-04-09 NOTE — Progress Notes (Signed)
[facility] Progress Note   Assessment/ Plan:   Outpatient dialysis prescription: [facility] [phone] 4 Hours, 180 dialyzer, 2K/2.5 Ca, DFR 500, BFR 400 Use AVF; graft on inner arm, 15 gauge Heparin loading dose 2000 and hourly dose of 1200 EDW 78.5 kg Last post weight 78 kg on 5/21 (went in at 78 and came out at 78 kg) Meds: epogen 2400 units each tx venofer 50 mg weekly Not on hectorol or calcitriol sensipar 30 mg three times a week with HD   Assessment/Plan:   Septic shock- on broad spectrum abx- Flagyl, cefepime.  Eraxis and flagyl stopped 04/05/21.  ID following.  Pressors per PCCM, improving Acute hypoxic RF: intubated per PCCM--> failed extubation 6/15, now back intubated.  Trached 6/16. ESRD - [place] in [place], last  outpatient HD 5/30 now on CRRT since 04/16/2021 following CABG complicated by PEA arrest and need for pressors.   - CRRT 04/06/2021-03/30/21 and temp HD catheter removed at that time.       - CRRT resumed 04/02/21 due to acute decompensation and profound hypotension, has L femoral temporary HD cath       - decrease UF rate 50-100 mL/ hr      - Please note that this pt is ESRD and PICC/ midline is not appropriate--> additional triple lumen/ CVC would be OK      - trach/ dialysis dispo will be complicated      - if pressor requirement continues to come down throughout the day then hopefully we can stop CRRT and consider switch to IHD--> in which case we could use his fistula and not even have to consider a TDC  CAD s/p CABG x 4 complicated by PEA arrest.  Acute on chronic CHF with cardiogenic shock post CABG - on midodrine 15 mg tid.  Repeat ECHO EF 45-50%, RV function moderately reduced. Pulmonary HTN - per HF team Cirrhosis - presumably cardiogenic.  S/p paracentesis on 03/18/21. 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  Hypophosphatemia - repleted with IV phos and follow.  Has required  multiple phos repletions- will see if Neutraphos is too gritty to get down the cortrak, appreciate pharmacy Afib/ flutter: on systemic heparin gtt and amiodarone Dispo: ICU  Subjective:    S/p trach yesterday.  Sedation coming down, pressor requirement coming down.  Interactive.   Objective:   BP (!) 92/50   Pulse (!) 101   Temp 98.5 F (36.9 C)   Resp 17   Ht '6\' 2"'  (1.88 m)   Wt 71.4 kg   SpO2 100%   BMI 20.21 kg/m   Physical Exam: Gen: ill appearing, cachectic, awake and following commands NECK: trach in place, c/d/i CVS: irregular, tachycardic Resp: coarse mechanical bilaterally Abd: soft Ext: sarcopenic, dependent thigh edema improved ACCESS: L nontunneled femoral HD catheter, R UE AVF + T/B  Labs: BMET Recent Labs  Lab 04/06/21 2054 04/07/21 0129 04/07/21 0244 04/07/21 1504 04/07/21 1924 04/07/21 2002 04/08/21 0427 04/08/21 1636 04/09/21 0029  NA 135 135 135 135 134* 135 135 133* 134*  K 3.6 4.2 3.7 4.5 6.2* 4.5 5.5* 4.1 4.7  CL 102 100 104 101 99  --  100 98 100  CO2 '22 22 24 27 ' 21*  --  '23 23 24  ' GLUCOSE 163* 145* 172* 124* 219*  --  123* 238* 180*  BUN '19 19 18 18 20  ' --  19 20 21*  CREATININE  1.49* 1.39* 1.44* 1.33* 1.25*  --  1.24 1.49* 1.37*  CALCIUM 7.9* 8.0* 7.7* 7.7* 8.0*  --  8.4* 8.1* 8.2*  PHOS 1.7* 1.5*  --  4.5 4.0  --  1.7* 3.1 3.4   CBC Recent Labs  Lab 04/03/21 0021 04/04/21 0123 04/05/21 0401 04/07/21 0244 04/07/21 1924 04/07/21 2002 04/08/21 0427 04/09/21 0029  WBC 82.3* 71.3*   < > 27.3* 29.5*  --  24.4* 26.8*  NEUTROABS 75.2* 64.6*  --   --  28.0*  --   --   --   HGB 8.8* 9.1*   < > 9.3* 11.7* 11.9* 10.5* 10.1*  HCT 26.2* 27.0*   < > 28.2* 36.8* 35.0* 32.2* 32.4*  MCV 91.3 89.1   < > 93.4 97.6  --  94.2 98.5  PLT 256 291   < > 195 215  --  185 237   < > = values in this interval not displayed.      Medications:     amiodarone  200 mg Per Tube BID   aspirin  81 mg Per Tube Daily   atorvastatin  40 mg Per Tube Daily    bisacodyl  10 mg Oral Daily   Or   bisacodyl  10 mg Rectal Daily   chlorhexidine gluconate (MEDLINE KIT)  15 mL Mouth Rinse BID   Chlorhexidine Gluconate Cloth  6 each Topical Q0600   collagenase   Topical Daily   darbepoetin (ARANESP) injection - NON-DIALYSIS  60 mcg Subcutaneous Q Thu-1800   docusate  100 mg Per Tube BID   feeding supplement (PROSource TF)  45 mL Per Tube QID   insulin aspart  0-24 Units Subcutaneous Q4H   insulin glargine  10 Units Subcutaneous Daily   ipratropium-albuterol  3 mL Nebulization TID   lidocaine-EPINEPHrine  30 mL Intradermal Once   mouth rinse  15 mL Mouth Rinse 10 times per day   midodrine  30 mg Per Tube TID WC   multivitamin  1 tablet Per Tube QHS   nutrition supplement (JUVEN)  1 packet Oral BID BM   pantoprazole sodium  40 mg Per Tube Daily   polyethylene glycol  17 g Per Tube Daily   potassium & sodium phosphates  1 packet Per Tube Daily   sodium chloride flush  10-40 mL Intracatheter Q12H   sodium chloride flush  3 mL Intravenous Q12H   Thrombi-Pad  1 each Topical Once     Madelon Lips, MD 04/09/2021, 9:40 AM

## 2021-04-09 NOTE — Progress Notes (Signed)
Wasted 100 mls Fentanyl in Stericycle bin.  Verified by Earlyne Iba

## 2021-04-09 NOTE — Progress Notes (Signed)
Patient ID: David Carel., male   DOB: 03-25-1966, 55 y.o.   MRN: [id number]      Advanced Heart Failure Rounding Note  PCP-Cardiologist: Carlyle Dolly, MD    Patient Profile   55 y/o male w/  history of ESRD due to DM and CHF with mid-range EF (LV EF 40-45% with moderately decreased RV systolic function and PASP 99 on 4/22 echo) as well as cirrhosis of uncertain etiology.  Based on low EF and elevated PA pressure, right and left heart cath was done in 5/22.  This showed severe 3VD, high cardiac output with moderate pulmonary hypertension but low PVR.  He is planned for CABG, but admitted pre-op for optimization.    Subjective:    CABG x 4 on 5/31 with LIMA-LAD, SVG-RCA, SVG-OM, SVG-D.   Coagulopathic post-op with multiple blood products.  Extubated post-op but developed respiratory distress => PEA arrest then VT. Reintubated.  ROSC with ACLS.  Returned to OR for mediastinal exploration 6/1.  Extubated 6/2.   6/6 Midodrine started.   6/9 AMS--> CT negative for acute bleed or ischemic CVA. Intubated. Suspected septic shock.  CTA chest showed no PE, moderate bilateral pleural effusions. Re-intubated and CVVH restarted. CT abdomen/pelvis: anasarca, bilateral pleural effusions and lung base opacities, thickened colon/terminal ileum (?due to cirrhosis, ?ileocolitis).   6/15: Extubated but later in the day had sudden onset of unresponsiveness with no clear etiology and re-intubated. EEG negative. NH3 18.   Trach 6/16  Asleep this morning but per nursing follows commands.    Currently on NE 8, vasopressin 0.03.   Wt stable/ at baseline. CVVH UF net 100-150 cc/ hr   Finished  Flagyl and Eraxis. Remains on cefepime for HCAP/septic shock.  WBCs 88 => 82 => 71 => 45=>37=>27=>24=>27.  AF. ID following.   On heparin gtt and amiodarone for AFL. HR 90s   Echo (6/9) with EF 45-50%, inferolateral severe hypokinesis, moderate RV dysfunction.    Objective:   Weight Range: 71.4 kg Body  mass index is 20.21 kg/m.   Vital Signs:   Temp:  [97.5 F (36.4 C)-98.9 F (37.2 C)] 97.8 F (36.6 C) (06/17 0344) Pulse Rate:  [77-104] 98 (06/17 0400) Resp:  [20-30] 30 (06/17 0400) BP: (90-132)/(47-83) 99/51 (06/17 0400) SpO2:  [100 %] 100 % (06/17 0400) FiO2 (%):  [40 %-50 %] 40 % (06/17 0322) Weight:  [71.4 kg] 71.4 kg (06/17 0335) Last BM Date: 04/07/21  Weight change: Filed Weights   04/07/21 0438 04/08/21 0451 04/09/21 0335  Weight: 72.4 kg 71.5 kg 71.4 kg    Intake/Output:   Intake/Output Summary (Last 24 hours) at 04/09/2021 0501 Last data filed at 04/09/2021 0400 Gross per 24 hour  Intake 3214.15 ml  Output 4791 ml  Net -1576.85 ml      Physical Exam   General: NAD Neck: Trach. No JVD, no thyromegaly or thyroid nodule.  Lungs: Clear to auscultation bilaterally with normal respiratory effort. CV: Nondisplaced PMI.  Heart regular S1/S2, no S3/S4, no murmur.  No peripheral edema.   Abdomen: Soft, nontender, no hepatosplenomegaly, no distention.  Skin: Intact without lesions or rashes.  Neurologic: Per nursing, follows commands.  Extremities: No clubbing or cyanosis.  HEENT: Normal.   Telemetry   atypical flutter 90s (personally reviewed)  Labs    CBC Recent Labs    04/07/21 1924 04/07/21 2002 04/08/21 0427 04/09/21 0029  WBC 29.5*  --  24.4* 26.8*  NEUTROABS 28.0*  --   --   --  HGB 11.7*   < > 10.5* 10.1*  HCT 36.8*   < > 32.2* 32.4*  MCV 97.6  --  94.2 98.5  PLT 215  --  185 237   < > = values in this interval not displayed.   Basic Metabolic Panel Recent Labs    04/08/21 0427 04/08/21 1636 04/09/21 0029  NA 135 133* 134*  K 5.5* 4.1 4.7  CL 100 98 100  CO2 '23 23 24  ' GLUCOSE 123* 238* 180*  BUN 19 20 21*  CREATININE 1.24 1.49* 1.37*  CALCIUM 8.4* 8.1* 8.2*  MG 2.6*  --  2.5*  PHOS 1.7* 3.1 3.4   Liver Function Tests Recent Labs    04/07/21 1924 04/08/21 0427 04/08/21 1636 04/09/21 0029  AST 41  --   --   --   ALT 21   --   --   --   ALKPHOS 202*  --   --   --   BILITOT 2.1*  --   --   --   PROT 7.0  --   --   --   ALBUMIN 2.3*   < > 1.9* 2.1*   < > = values in this interval not displayed.   No results for input(s): LIPASE, AMYLASE in the last 72 hours. Cardiac Enzymes No results for input(s): CKTOTAL, CKMB, CKMBINDEX, TROPONINI in the last 72 hours.  BNP: BNP (last 3 results) Recent Labs    02/27/2021 1545  BNP 1,127.8*    ProBNP (last 3 results) No results for input(s): PROBNP in the last 8760 hours.   D-Dimer No results for input(s): DDIMER in the last 72 hours.  Hemoglobin A1C No results for input(s): HGBA1C in the last 72 hours. Fasting Lipid Panel No results for input(s): CHOL, HDL, LDLCALC, TRIG, CHOLHDL, LDLDIRECT in the last 72 hours. Thyroid Function Tests Recent Labs    04/07/21 2138  TSH 6.130*    Other results:   Imaging    DG CHEST PORT 1 VIEW  Result Date: 04/08/2021 CLINICAL DATA:  Emergency tracheostomy for assistance in breathing EXAM: PORTABLE CHEST 1 VIEW COMPARISON:  Portable exam 1420 hours compared to 04/07/2021 FINDINGS: New tracheostomy tube with tip projecting 6.5 cm above carina. Feeding tube extends into abdomen. Normal heart size mediastinal contours. Diffuse LEFT lung infiltrates greater in LEFT lower lobe, with mild infiltrate at RIGHT base. No pleural effusion or pneumothorax. IMPRESSION: Persistent pulmonary infiltrates. Electronically Signed   By: Lavonia Dana M.D.   On: 04/08/2021 16:31      Medications:     Scheduled Medications:  amiodarone  200 mg Per Tube BID   aspirin  81 mg Per Tube Daily   atorvastatin  40 mg Per Tube Daily   bisacodyl  10 mg Oral Daily   Or   bisacodyl  10 mg Rectal Daily   chlorhexidine gluconate (MEDLINE KIT)  15 mL Mouth Rinse BID   Chlorhexidine Gluconate Cloth  6 each Topical Q0600   collagenase   Topical Daily   darbepoetin (ARANESP) injection - NON-DIALYSIS  60 mcg Subcutaneous Q Thu-1800   docusate  100  mg Per Tube BID   feeding supplement (PROSource TF)  45 mL Per Tube QID   insulin aspart  0-24 Units Subcutaneous Q4H   insulin glargine  10 Units Subcutaneous Daily   ipratropium-albuterol  3 mL Nebulization TID   lidocaine-EPINEPHrine  30 mL Intradermal Once   mouth rinse  15 mL Mouth Rinse 10 times per day  midodrine  15 mg Per Tube TID WC   multivitamin  1 tablet Per Tube QHS   nutrition supplement (JUVEN)  1 packet Oral BID BM   pantoprazole sodium  40 mg Per Tube Daily   polyethylene glycol  17 g Per Tube Daily   potassium & sodium phosphates  1 packet Per Tube Daily   sodium chloride flush  10-40 mL Intracatheter Q12H   sodium chloride flush  3 mL Intravenous Q12H   Thrombi-Pad  1 each Topical Once    Infusions:   prismasol BGK 4/2.5 500 mL/hr at 04/09/21 0205    prismasol BGK 4/2.5 300 mL/hr at 04/09/21 0022   sodium chloride 10 mL/hr at 04/09/21 0400   dexmedetomidine (PRECEDEX) IV infusion Stopped (04/08/21 0506)   dextrose 10 % and 0.45 % NaCl 40 mL/hr at 04/09/21 0400   feeding supplement (VITAL 1.5 CAL) 1,000 mL (04/07/21 1446)   fentaNYL infusion INTRAVENOUS 225 mcg/hr (04/09/21 0400)   heparin 2,800 Units/hr (04/09/21 0400)   lactated ringers     lactated ringers Stopped (04/01/21 1902)   norepinephrine (LEVOPHED) Adult infusion 10 mcg/min (04/09/21 0400)   prismasol BGK 4/2.5 1,500 mL/hr at 04/09/21 0339   vasopressin 0.03 Units/min (04/09/21 0400)    PRN Medications: fentaNYL (SUBLIMAZE) injection, fentaNYL (SUBLIMAZE) injection, heparin, lactated ringers, lidocaine (PF), lidocaine HCl (PF), lip balm, metoprolol tartrate, morphine injection, ondansetron (ZOFRAN) IV, oxyCODONE, sodium chloride, sodium chloride flush     Assessment/Plan   1. CAD: Severe 3VD with decreased EF.  I reviewed films with Dr. Ellyn Hack, PCI would be possible but would be difficult/high risk with multiple lesions and heavy calcification. 5/31 LIMA-LAD, SVG-RCA, SVG-OM, SVG-D. No s/s  ischemic currently  - ASA 81 + atorvastatin 40 mg daily.  2. Acute/chronic HF with mid range EF => cardiogenic shock post-CABG: Suspect ischemic cardiomyopathy.  Echo 4/22 with EF 40-45% with moderately decreased RV systolic function and PASP 99, moderate TR. There was a prominent component of RV failure with RA pressure elevated out of proportion to PCWP (CVP/PCWP 0.875 on RHC) but PAPI adequate at 2.8. Post-op had PEA arrest then VT.  On 6/9, episode of altered mental status, progressive hypotension, and intubated.  Suspect septic shock, now improving.  Echo (6/9) with EF 45-50%, inferolateral severe hypokinesis, moderate RV dysfunction. On NE 10 mcg + VP 0.03.  Weight below baseline.  - CVVH ongoing, would decrease UF rate to 0-50 cc/hr to facilitate pressor wean.  - Continue to wean pressors as able.  - Continue midodrine 3. ESRD: Suspected due to diabetes.  Volume up post-op with multiple blood products. Now back to baseline.  - CVVH ongoing, will aim to keep net even   - Needs tunneled HD catheter upper chest but IR recommends waiting for WBCs to come down.  - Renal following  4. 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  The CO was not markedly high.  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 Oxygen saturation was 99% on RA pre-op, so no evidence for hepatopulmonary syndrome.   - Sildenafil has been on hold with pressors.  5. Cirrhosis: Noted on abdominal US from 2020.  H/o paracentesis.  Had workup at [facility] (though patient does not remember this), viral hepatitis labs negative.  They ended up think that the cirrhosis was cardiogenic.  Repeat abdominal US 5/23 w/ nodular hepatic parenchymal pattern with increased echogenicity consistent with the patient's known cirrhosis. No focal  hepatic abnormality identified. Portal vein is patent. Moderate ascites. GI has seen, patient had 4L  paracentesis on 5/26. - He may eventually need repeat paracentesis.  6. Coagulopathy: Post-op bleeding in setting of coagulopathy post-CABG in patient with cirrhosis.  Currently seems controlled.  Platelets dropped then recovered, doubt HIT.  7. VT: Had after PEA arrest am 6/1.  - Continue amiodarone 200 bid per tube.  8. Anemia: ABLA.  Hgb stable at 10.  9. Pleural effusions: ?hepatic hydrothorax.    - Left effusion on last CXR, ID recommend thoracentesis.     10. PEA arrest: Respiratory arrest post-extubation.  ROSC with CPR.  11. Acute hypoxemic respiratory failure:  Extubated 6/2, re-intubated 6/9. Self-extubated overnight 6/12 and re-intubated. Extubated 6/15 then re-intubated. Now s/p tracheostomy.  12. Atrial arrhythmias: Atypical atrial flutter currently.   - Continue amiodarone per tube.  - On heparin gtt - Eventual DCCV.  13. ID:  Suspected HCAP with septic shock.  WBC 88=>85=>41=>29=>27=>24=>28. Now afebrile. Cultures with Staph epidermidis 1/2, suspect contaminant.  - ID following, continue cefepime.  14. Neuro: Altered mental status 6/9 with intubation.  CT head with no bleed or ischemic CVA seen.  EEG with diffuse encephalopathy.  6/15: sudden onset of unresponsiveness with no clear etiology. EEG unremarkable. Now alert and following commands  - neuro following   CRITICAL CARE Performed by: Loralie Champagne  Total critical care time: 35 minutes  Critical care time was exclusive of separately billable procedures and treating other patients.  Critical care was necessary to treat or prevent imminent or life-threatening deterioration.  Critical care was time spent personally by me on the following activities: development of treatment plan with patient and/or surrogate as well as nursing, discussions with consultants, evaluation of patient's response to treatment, examination of patient, obtaining history from patient or surrogate, ordering and performing treatments and  interventions, ordering and review of laboratory studies, ordering and review of radiographic studies, pulse oximetry and re-evaluation of patient's condition.  Loralie Champagne 04/09/2021 5:01 AM

## 2021-04-09 NOTE — Progress Notes (Signed)
NAME:  David Rose., MRN:  [id number], DOB:  November 24, 1965, LOS: 25 ADMISSION DATE:  03/11/2021, CONSULTATION DATE:  04/17/2021 REFERRING MD:  TCTS CHIEF COMPLAINT: Code   History of Present Illness:  55 year old man with PMHx significant for HTN, T2DM, PAD, ESRD, cirrhosis (query cardiac etiology) who presented for symptomatic ischemic cardiomyopathy.    Patient underwent preoperative optimization with CHF, GI, and Nephrology teams with eventual CABG 5/31. On 6/1, patient developed respiratory distress followed by PEA arrest requiring CPR. Code was c/b increased bleeding around surgical sites, prompting mediastinal exploration 6/1. Intraoperative findings were notable for bleeding around mammary graft site and diffuse oozing for which patient received multiple blood products.   Patient intubated and on pressors postoperatively and PCCM was consulted for assistance with management.  Pertinent Medical History:  ESRD on HD Cardiac cirrhosis with portal hypertension IBS IDDM  Significant Hospital Events: Including procedures, antibiotic start and stop dates in addition to other pertinent events   5/23 Admitted 5/26 Paracentesis 4L 5/30 HD 5/31 CABG 5/31- 6/1 Code Blue, OR for mediastinal exploration 2/2 hemorrhagic shock induced by CPR. Factor 7, protamine, FFP, cryo given 6/1 PCCM consult, 4U PRBCs and 1U Plt . R pigtail chest tube placed due to limitation of vent weaning. 6/2 Extubated, chest tube 3.2L out past 24 hrs 6/3 Remains on 2L [place], chest tube with 500cc out/ 24 hrs 6/4 Remains on CRRT, 2L [place], milrinone, NE. 3 chest tubes remain. 6/6 Remains on CRRT, 4 chest tubes in place 6/9 Decreasing pressor requirements and CT output. CRRT transitioned to iHD. Echo repeated. Prior to HD session, patient suddenly became unresponsive with fixed and dilated pupils, minimal gag, breathing spontaneously. Code Stroke called. Reintubated, heparin d/c'ed, STAT CT Head/CTA. 6/10, WBC jump to 89 K,  Temp of 102,Remains on 40 of Levo, 40 of Neo, and Vaso at 0.03.Pupils are 6 and non-responsive to light. Trialysis cath inserted for CVVHD. R IJ is 23 days old, will get PICC and discontinue. Lactate of 2.8. Pigtail cath removed  6/12 coming down on pressors? Patient self extubated and reintubated 6/13 MRI negative for acute findings 6/15 Patient extubated; spoke with family and patient, if he fails extubation trial and requires reintubation they are okay with trach placement. 6/16 placed on cont sedation for trach. Trach completed. Started on LTM for episodes of being unresponsive.  IV cefepime stopped, completing course of antibiotics for pneumonia no organism specified 6/17 fent gtt stopped. Was actually fully awake even on fent gtt. Beginning ATC trials w/ plan for mandatory vent at night.    Interim History / Subjective:  No events over the evening   Objective   Blood pressure (Abnormal) 99/54, pulse 97, temperature 98.5 F (36.9 C), resp. rate (Abnormal) 26, height '6\' 2"'  (1.88 m), weight 71.4 kg, SpO2 100 %. 4LNC    Vent Mode: PRVC FiO2 (%):  [40 %-50 %] 40 % Set Rate:  [30 bmp] 30 bmp Vt Set:  [570 mL] 570 mL PEEP:  [5 cmH20] 5 cmH20 Plateau Pressure:  [14 cmH20-21 cmH20] 20 cmH20   Intake/Output Summary (Last 24 hours) at 04/09/2021 0802 Last data filed at 04/09/2021 0700 Gross per 24 hour  Intake 3249.47 ml  Output 4529 ml  Net -1279.53 ml   Filed Weights   04/07/21 0438 04/08/21 0451 04/09/21 0335  Weight: 72.4 kg 71.5 kg 71.4 kg   Physical Examination: General this is a chronically ill-appearing 55 year old white male he remains on full ventilatory support he appears comfortable and  is in no distress HEENT temporal wasting, no JVD, mucous membranes moist.  New tracheostomy unremarkable. Pulmonary: Clear to auscultation diminished in the bases no accessory use on minimal vent support Cardiac: Regular rate and rhythm no murmur rub or gallop Abdomen soft not tender his  tube feeds have been on hold will resume these today GU does not make urine Extremities are warm dry pulses are palpable does have some dependent edema.  His right AV fistula has a strong bruit and thrill Neuro awake, interactive, appropriate.  Moves all extremities.  No focal deficits appreciated.  Labs/imaging that I have personally reviewed: (right click and "Reselect all SmartList Selections" daily)   See below   Resolved Hospital Problem list   Fever Post-op mediastinal hemorrhage Cirrhotic and hemorrhage- induced consumptive coagulopathy Hemorrhagic shock, resolved Acute blood loss anemia Thrombocytopenia- in setting of blood loss, coagulopathy, and cirrhosis Elevated Alk Phos/AST VT 6/1 Assessment & Plan:    CAD post CABG c/b  in hospital PEA cardiac arrest precipitated by hypoxia and blood loss Cardiac Cirrhosis with ascites Hx HTN PH; likely mixed arterial & venous. 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 CT head, MRI, and EEG negative Unclear what caused this episode.  Work-up as so far been unrevealing, I wonder if this is just hypercarbia from poor muscular endurance Plan: Continue supportive care Continue EEG Minimize sedation, he is currently on a fentanyl infusion I am going to stop this   Acute hypoxemic respiratory failure due to inability to protect airway Pulmonary edema and Bilateral pleural effusions (chest tubes removed.) and  LLL HAP vs pleural effusion Status post tracheostomy 6/16. Portable chest  x-ray from post tracheostomy on 6/16 reviewed personally : Tracheostomy is in satisfactory position.  Appears to be left-sided pleural effusion/airspace disease.  Right sided aeration improved Plan Discontinue sedating drips RASS goal 0  Initiate aerosol trach collar trials during day, I think he needs mandatory rest on vent at night for at least the next several days  Aggressive pulmonary toilet  Continue DuoNebs  Get him out of bed  Volume removal mentioned above  Repeat a.m. chest x-ray, if left-sided airspace disease persists in spite of volume removal we might need to consider ultrasound of the left chest again     New Leukocytosis 6/10: This persists in spite of completing a course of antibiotics.  Bumped a little bit last night, some of this could be reactive post procedure Plan: Monitor off antibiotics Continue to trend CBC Fever curve will be difficult on CRRT   ESRD on HD PTA: known IJ stenosis and concern of R IJ stenosis; difficult access, RUE AVF Trialysis catheter placed 6/10 Plan: Continuing CRRT Hopefully can transition to intermittent dialysis, contacted interventional radiology in regards to tunneled catheter, they would like to hold off given leukocytosis which seems reasonable.  If we can get him off from pressors we may be able to utilize his graft instead   Intermittent fluid electrolyte imbalance: Hyperkalemia Plan Continue volume removal as addressed above Check and replace electrolytes as indicated  DM2  Plan: Sliding scale insulin Resume basal coverage   Sacral  pressure ulcer Plan Continue wound care as addressed by wound ostomy team  Best Practice (right click and "Reselect all SmartList Selections" daily)   Diet/type: tubefeeds Pain/Anxiety/Delirium protocol Yes and RASS goal: 0 VAP protocol (if indicated): Yes DVT prophylaxis: systemic heparin GI prophylaxis: PPI Glucose control:  SSI and Basal coverage Central venous access:  Yes, and it  is still needed Arterial line:  N/A Foley:  N/A Mobility:  OOB  PT consulted: Yes Studies pending: None Culture data pending:none Last reviewed culture data:today Antibiotics:not indicated  Antibiotic de-escalation: N/A Stop date: N/A Daily labs: requested Code Status:  full code Last date of multidisciplinary goals of care discussion [per primary ] placing care management consult. Disposition will be a challenge  Serious Disposition: remains critically ill, will stay in intensive care   My cct 32 min   David Rose ACNP-BC [place] Pager # [phone] OR # [phone] if no answer

## 2021-04-09 NOTE — Care Management (Signed)
1124 04-09-21 Case Manager spoke to both LTAC's [facility] and [facility] on 04-08-21 and both facilities are willing to offer a bed. Case Manager did make the LTAC's aware that per provider, the patient is not quite ready to transition to LTAC at this time. Case Manager will continue to follow for additional transition of care needs.

## 2021-04-09 NOTE — Progress Notes (Signed)
TCTS BRIEF SICU PROGRESS NOTE  16 Days Post-Op  S/P Procedure(s) (LRB): EXPLORATION POST OPERATIVE OPEN HEART (N/A)   Stable day  Plan: Continue current plan  Rexene Alberts, MD 04/09/2021 6:01 PM

## 2021-04-09 NOTE — Progress Notes (Signed)
ANTICOAGULATION CONSULT NOTE  Pharmacy Consult for heparin Indication: atrial flutter  Allergies  Allergen Reactions   Shellfish-Derived Products Anaphylaxis   Wasp Venom Protein Anaphylaxis   Penicillins Other (See Comments)    Childhood reaction    Patient Measurements: Height: '6\' 2"'$  (188 cm) Weight: 71.4 kg (157 lb 6.5 oz) IBW/kg (Calculated) : 82.2 Heparin Dosing Weight: 79kg  Vital Signs: Temp: 98.5 F (36.9 C) (06/17 0700) Temp Source: Oral (06/17 0344) BP: 99/54 (06/17 0800) Pulse Rate: 97 (06/17 0800)  Labs: Recent Labs    04/07/21 1924 04/07/21 2002 04/07/21 2138 04/08/21 0427 04/08/21 1636 04/09/21 0029  HGB 11.7* 11.9*  --  10.5*  --  10.1*  HCT 36.8* 35.0*  --  32.2*  --  32.4*  PLT 215  --   --  185  --  237  HEPARINUNFRC  --   --  0.36 0.10*  --  0.44  CREATININE 1.25*  --   --  1.24 1.49* 1.37*     Estimated Creatinine Clearance: 61.5 mL/min (A) (by C-G formula based on SCr of 1.37 mg/dL (H)).   Medical History: Past Medical History:  Diagnosis Date   ESRD (end stage renal disease) ([place])    Hypertension    Peripheral arterial disease ([place])    Type 2 diabetes mellitus ([place])    Assessment: 51 YOF with recent CABG now with postop afib/flutter. HIT negative per labs.   Heparin level today came back therapeutic at 0.44 - running peripheral, being drawn peripheral (unable to do foot stick, no alternative access for drawing at this time). Hgb 10.1, plt 237. No s/sx of bleeding or infusion issues.   Goal of Therapy:  Heparin level 0.3-0.5  units/ml  Monitor platelets by anticoagulation protocol: Yes   Plan:  Continue heparin infusion at 2800 units/hr Monitor daily HL, CBC, and for s/sx of bleeding F/u if able to get alternative central access in order to draw heparin levels centrally  Antonietta Jewel, PharmD, [place] Pharmacist  Phone: [phone] 04/09/2021 8:31 AM  Please check AMION for all [place] phone numbers After 10:00 PM,  call [place] [phone]

## 2021-04-10 ENCOUNTER — Inpatient Hospital Stay (HOSPITAL_COMMUNITY): Payer: Medicare Other

## 2021-04-10 DIAGNOSIS — R6521 Severe sepsis with septic shock: Secondary | ICD-10-CM | POA: Diagnosis not present

## 2021-04-10 DIAGNOSIS — R4182 Altered mental status, unspecified: Secondary | ICD-10-CM | POA: Diagnosis not present

## 2021-04-10 DIAGNOSIS — I5023 Acute on chronic systolic (congestive) heart failure: Secondary | ICD-10-CM | POA: Diagnosis not present

## 2021-04-10 DIAGNOSIS — R569 Unspecified convulsions: Secondary | ICD-10-CM

## 2021-04-10 DIAGNOSIS — R570 Cardiogenic shock: Secondary | ICD-10-CM | POA: Diagnosis not present

## 2021-04-10 DIAGNOSIS — A419 Sepsis, unspecified organism: Secondary | ICD-10-CM | POA: Diagnosis not present

## 2021-04-10 LAB — CBC
HCT: 31.4 % — ABNORMAL LOW (ref 39.0–52.0)
Hemoglobin: 9.7 g/dL — ABNORMAL LOW (ref 13.0–17.0)
MCH: 31.1 pg (ref 26.0–34.0)
MCHC: 30.9 g/dL (ref 30.0–36.0)
MCV: 100.6 fL — ABNORMAL HIGH (ref 80.0–100.0)
Platelets: 208 10*3/uL (ref 150–400)
RBC: 3.12 MIL/uL — ABNORMAL LOW (ref 4.22–5.81)
RDW: 24.1 % — ABNORMAL HIGH (ref 11.5–15.5)
WBC: 28 10*3/uL — ABNORMAL HIGH (ref 4.0–10.5)
nRBC: 0.2 % (ref 0.0–0.2)

## 2021-04-10 LAB — RENAL FUNCTION PANEL
Albumin: 2.1 g/dL — ABNORMAL LOW (ref 3.5–5.0)
Albumin: 2.1 g/dL — ABNORMAL LOW (ref 3.5–5.0)
Anion gap: 7 (ref 5–15)
Anion gap: 9 (ref 5–15)
BUN: 30 mg/dL — ABNORMAL HIGH (ref 6–20)
BUN: 30 mg/dL — ABNORMAL HIGH (ref 6–20)
CO2: 27 mmol/L (ref 22–32)
CO2: 26 mmol/L (ref 22–32)
Calcium: 8.4 mg/dL — ABNORMAL LOW (ref 8.9–10.3)
Calcium: 8.4 mg/dL — ABNORMAL LOW (ref 8.9–10.3)
Chloride: 102 mmol/L (ref 98–111)
Chloride: 100 mmol/L (ref 98–111)
Creatinine, Ser: 1.16 mg/dL (ref 0.61–1.24)
Creatinine, Ser: 1.35 mg/dL — ABNORMAL HIGH (ref 0.61–1.24)
GFR, Estimated: >60 mL/min (ref >60)
GFR, Estimated: >60 mL/min (ref >60)
Glucose, Bld: 127 mg/dL — ABNORMAL HIGH (ref 70–99)
Glucose, Bld: 194 mg/dL — ABNORMAL HIGH (ref 70–99)
Phosphorus: 1.9 mg/dL — ABNORMAL LOW (ref 2.5–4.6)
Phosphorus: 1.6 mg/dL — ABNORMAL LOW (ref 2.5–4.6)
Potassium: 4.5 mmol/L (ref 3.5–5.1)
Potassium: 4.9 mmol/L (ref 3.5–5.1)
Sodium: 136 mmol/L (ref 135–145)
Sodium: 135 mmol/L (ref 135–145)

## 2021-04-10 LAB — MAGNESIUM: Magnesium: 2.5 mg/dL — ABNORMAL HIGH (ref 1.7–2.4)

## 2021-04-10 LAB — HEPARIN LEVEL (UNFRACTIONATED): Heparin Unfractionated: 0.51 IU/mL (ref 0.30–0.70)

## 2021-04-10 LAB — GLUCOSE, CAPILLARY
Glucose-Capillary: 128 mg/dL — ABNORMAL HIGH (ref 70–99)
Glucose-Capillary: 135 mg/dL — ABNORMAL HIGH (ref 70–99)
Glucose-Capillary: 184 mg/dL — ABNORMAL HIGH (ref 70–99)
Glucose-Capillary: 227 mg/dL — ABNORMAL HIGH (ref 70–99)
Glucose-Capillary: 233 mg/dL — ABNORMAL HIGH (ref 70–99)
Glucose-Capillary: 251 mg/dL — ABNORMAL HIGH (ref 70–99)

## 2021-04-10 MED ORDER — QUETIAPINE FUMARATE 25 MG PO TABS
25.0000 mg | ORAL_TABLET | Freq: Every day | ORAL | Status: DC
  Administered 2021-04-10 (×2): 25 mg via ORAL
  Filled 2021-04-10 (×2): qty 1

## 2021-04-10 MED ORDER — JUVEN PO PACK
1.0000 | PACK | Freq: Two times a day (BID) | ORAL | Status: DC
  Administered 2021-04-10 – 2021-04-14 (×8): 1
  Filled 2021-04-10 (×7): qty 1

## 2021-04-10 MED ORDER — DEXMEDETOMIDINE HCL IN NACL 400 MCG/100ML IV SOLN
0.4000 ug/kg/h | INTRAVENOUS | Status: DC
  Administered 2021-04-10: 0.4 ug/kg/h via INTRAVENOUS
  Administered 2021-04-10 (×2): 1.2 ug/kg/h via INTRAVENOUS
  Administered 2021-04-11: 0.8 ug/kg/h via INTRAVENOUS
  Administered 2021-04-11: 0.6 ug/kg/h via INTRAVENOUS
  Administered 2021-04-12 – 2021-04-13 (×5): 1.2 ug/kg/h via INTRAVENOUS
  Administered 2021-04-13: 0.5 ug/kg/h via INTRAVENOUS
  Administered 2021-04-13: 1.2 ug/kg/h via INTRAVENOUS
  Administered 2021-04-14: 0.4 ug/kg/h via INTRAVENOUS
  Administered 2021-04-14: 0.7 ug/kg/h via INTRAVENOUS
  Administered 2021-04-14: 0.6 ug/kg/h via INTRAVENOUS
  Filled 2021-04-10 (×16): qty 100

## 2021-04-10 MED ORDER — SODIUM CHLORIDE 0.9 % IV SOLN
1000.0000 mg | Freq: Three times a day (TID) | INTRAVENOUS | Status: DC
  Administered 2021-04-10 – 2021-04-15 (×15): 1000 mg via INTRAVENOUS
  Filled 2021-04-10 (×17): qty 1

## 2021-04-10 NOTE — Progress Notes (Signed)
LTM maint complete - All leads attached. [facility] monitored, Event button test confirmed by [facility].

## 2021-04-10 NOTE — Progress Notes (Signed)
Patient ID: David Rose., male   DOB: Mar 19, 1966, 55 y.o.   MRN: [id number]      Advanced Heart Failure Rounding Note  PCP-Cardiologist: Carlyle Dolly, MD    Patient Profile   55 y/o male w/  history of ESRD due to DM and CHF with mid-range EF (LV EF 40-45% with moderately decreased RV systolic function and PASP 99 on 4/22 echo) as well as cirrhosis of uncertain etiology.  Based on low EF and elevated PA pressure, right and left heart cath was done in 5/22.  This showed severe 3VD, high cardiac output with moderate pulmonary hypertension but low PVR.  He is planned for CABG, but admitted pre-op for optimization.    Subjective:    Events  - 5/31 CABG x 4 with LIMA-LAD, SVG-RCA, SVG-OM, SVG-D.  - 5/31  Coagulopathic post-op with multiple blood products.  Extubated post-op but developed respiratory distress => PEA arrest then VT. Reintubated.   - 6/1 Returned to OR for mediastinal exploration - 6/2  Extubated - 6/9 AMS--> CT negative for acute bleed or ischemic CVA. Intubated. Suspected septic shock. CVVH restarted. - 6/15: Extubated but later in the day had sudden onset of unresponsiveness re-intubated. EEG negative. NH3 18.  - 6/16 trach  - 6/16 abx stopped Finished Flagyl/Eraxis/cefepime for HCAP/septic shock.   Remains on CVVHD. NE now up to 20. Tolerated TCTs yesterday but failed today. Agitated now not following commands. WBC climbing.   On heparin gtt and amiodarone for AFL. HR 90s   Echo (6/9) with EF 45-50%, inferolateral severe hypokinesis, moderate RV dysfunction.    Objective:   Weight Range: 65.7 kg Body mass index is 18.6 kg/m.   Vital Signs:   Temp:  [96.3 F (35.7 C)-99.3 F (37.4 C)] 99.3 F (37.4 C) (06/18 0345) Pulse Rate:  [79-105] 103 (06/18 0900) Resp:  [12-44] 23 (06/18 0900) BP: (64-165)/(36-147) 98/44 (06/18 0900) SpO2:  [95 %-100 %] 99 % (06/18 0900) FiO2 (%):  [40 %] 40 % (06/18 0837) Weight:  [65.7 kg] 65.7 kg (06/18 0500) Last BM Date:  04/09/21  Weight change: Filed Weights   04/08/21 0451 04/09/21 0335 04/10/21 0500  Weight: 71.5 kg 71.4 kg 65.7 kg    Intake/Output:   Intake/Output Summary (Last 24 hours) at 04/10/2021 1019 Last data filed at 04/10/2021 0900 Gross per 24 hour  Intake 2983.93 ml  Output 4944 ml  Net -1960.07 ml       Physical Exam   General:  Cachetic male lying in bed. Agitated. Won't follow commands HEENT: normal  Neck: supple. + trach Cor: PMI nondisplaced. Regular rate & rhythm. No rubs, gallops or murmurs. Lungs: coarse Abdomen: soft, nontender, nondistended. No hepatosplenomegaly. No bruits or masses. Good bowel sounds. Extremities: no cyanosis, clubbing, rash, edema Neuro: agitated not following commands   Telemetry   atypical flutter 90-110 (personally reviewed)  Labs    CBC Recent Labs    04/07/21 1924 04/07/21 2002 04/09/21 0029 04/10/21 0037  WBC 29.5*   < > 26.8* 28.0*  NEUTROABS 28.0*  --   --   --   HGB 11.7*   < > 10.1* 9.7*  HCT 36.8*   < > 32.4* 31.4*  MCV 97.6   < > 98.5 100.6*  PLT 215   < > 237 208   < > = values in this interval not displayed.    Basic Metabolic Panel Recent Labs    04/09/21 0029 04/09/21 1600 04/10/21 0037  NA 134* 134*  136  K 4.7 4.5 4.5  CL 100 100 102  CO2 _0 GLUCOSE 180* 223* 127*  BUN 21* 27* 30*  CREATININE 1.37* 1.18 1.16  CALCIUM 8.2* 8.1* 8.4*  MG 2.5*  --  2.5*  PHOS 3.4 2.9 1.9*    Liver Function Tests Recent Labs    04/07/21 1924 04/08/21 0427 04/09/21 1600 04/10/21 0037  AST 41  --   --   --   ALT 21  --   --   --   ALKPHOS 202*  --   --   --   BILITOT 2.1*  --   --   --   PROT 7.0  --   --   --   ALBUMIN 2.3*   < > 2.0* 2.1*   < > = values in this interval not displayed.    No results for input(s): LIPASE, AMYLASE in the last 72 hours. Cardiac Enzymes No results for input(s): CKTOTAL, CKMB, CKMBINDEX, TROPONINI in the last 72 hours.  BNP: BNP (last 3 results) Recent Labs     03/14/2021 1545  BNP 1,127.8*     ProBNP (last 3 results) No results for input(s): PROBNP in the last 8760 hours.   D-Dimer No results for input(s): DDIMER in the last 72 hours.  Hemoglobin A1C No results for input(s): HGBA1C in the last 72 hours. Fasting Lipid Panel No results for input(s): CHOL, HDL, LDLCALC, TRIG, CHOLHDL, LDLDIRECT in the last 72 hours. Thyroid Function Tests Recent Labs    04/07/21 2138  TSH 6.130*     Other results:   Imaging    DG CHEST PORT 1 VIEW  Result Date: 04/10/2021 CLINICAL DATA:  Pleural effusion. EXAM: PORTABLE CHEST 1 VIEW COMPARISON:  04/08/2021 FINDINGS: Again noted is a tracheostomy tube and feeding tube. The feeding tube extends beyond the image. Patchy and hazy densities throughout the left hemithorax are similar to the recent comparison examination. Persistent opacity or consolidation at the medial left lung base. Few densities at the medial right lung base but otherwise the right lung is clear. IMPRESSION: 1. Stable chest radiograph findings. 2. No significant change in the densities throughout the left lung. Findings could represent a combination of pleural fluid and airspace disease. Electronically Signed   By: Markus Daft M.D.   On: 04/10/2021 09:44      Medications:     Scheduled Medications:  amiodarone  200 mg Per Tube BID   aspirin  81 mg Per Tube Daily   atorvastatin  40 mg Per Tube Daily   bisacodyl  10 mg Oral Daily   Or   bisacodyl  10 mg Rectal Daily   chlorhexidine gluconate (MEDLINE KIT)  15 mL Mouth Rinse BID   Chlorhexidine Gluconate Cloth  6 each Topical Q0600   collagenase   Topical Daily   darbepoetin (ARANESP) injection - NON-DIALYSIS  60 mcg Subcutaneous Q Thu-1800   docusate  100 mg Per Tube BID   feeding supplement (PROSource TF)  45 mL Per Tube QID   insulin aspart  0-24 Units Subcutaneous Q4H   insulin glargine  10 Units Subcutaneous Daily   ipratropium-albuterol  3 mL Nebulization TID    lidocaine-EPINEPHrine  30 mL Intradermal Once   mouth rinse  15 mL Mouth Rinse 10 times per day   midodrine  30 mg Per Tube TID WC   multivitamin  1 tablet Per Tube QHS   nutrition supplement (JUVEN)  1 packet Oral BID BM  pantoprazole sodium  40 mg Per Tube Daily   polyethylene glycol  17 g Per Tube Daily   QUEtiapine  25 mg Oral QHS   sodium chloride flush  10-40 mL Intracatheter Q12H   sodium chloride flush  3 mL Intravenous Q12H   Thrombi-Pad  1 each Topical Once    Infusions:   prismasol BGK 4/2.5 500 mL/hr at 04/10/21 0935    prismasol BGK 4/2.5 300 mL/hr at 04/09/21 1711   sodium chloride Stopped (04/10/21 0811)   feeding supplement (VITAL 1.5 CAL) 65 mL/hr at 04/10/21 0700   heparin 2,800 Units/hr (04/10/21 0900)   lactated ringers     lactated ringers Stopped (04/01/21 1902)   norepinephrine (LEVOPHED) Adult infusion 21 mcg/min (04/10/21 0934)   prismasol BGK 4/2.5 1,500 mL/hr at 04/10/21 0751   vasopressin Stopped (04/09/21 0904)    PRN Medications: heparin, lactated ringers, lidocaine (PF), lidocaine HCl (PF), lip balm, metoprolol tartrate, morphine injection, ondansetron (ZOFRAN) IV, oxyCODONE, sodium chloride, sodium chloride flush     Assessment/Plan   1. CAD: Severe 3VD with decreased EF.  I reviewed films with Dr. Ellyn Hack, PCI would be possible but would be difficult/high risk with multiple lesions and heavy calcification. 5/31 LIMA-LAD, SVG-RCA, SVG-OM, SVG-D. No s/s ischemic currently  - ASA 81 + atorvastatin 40 mg daily.  2. Acute/chronic HF with mid range EF => cardiogenic shock post-CABG: Suspect ischemic cardiomyopathy.  Echo 4/22 with EF 40-45% with moderately decreased RV systolic function and PASP 99, moderate TR. There was a prominent component of RV failure with RA pressure elevated out of proportion to PCWP (CVP/PCWP 0.875 on RHC) but PAPI adequate at 2.8. Post-op had PEA arrest then VT.  On 6/9, episode of altered mental status, progressive  hypotension, and intubated.  Suspect septic shock, now improving.  Echo (6/9) with EF 45-50%, inferolateral severe hypokinesis, moderate RV dysfunction.  - NE increased 10-> 20 mcg overnight + VP 0.03. Co-ox 80% - Suspect recurrent sepsis - CVVH ongoing. Keep even - Continue to wean pressors as able.  - Continue midodrine 3. ESRD: Suspected due to diabetes.  Volume up post-op with multiple blood products. Now back to baseline.  - CVVH ongoing, will aim to keep net even . Appreciate Nephrology input - Needs tunneled HD catheter upper chest but IR recommends waiting for WBCs to come down.  - Renal following  4. Acute respiratory failure - s/p trach 6/16 - failed SBT today likely due to sepsis  - CCM managing  5. 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  The CO was not markedly high.  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 Oxygen saturation was 99% on RA pre-op, so no evidence for hepatopulmonary syndrome.   - Sildenafil has been on hold with pressors.  5. Cirrhosis: Noted on abdominal US from 2020.  H/o paracentesis.  Had workup at [facility] (though patient does not remember this), viral hepatitis labs negative.  They ended up think that the cirrhosis was cardiogenic.  Repeat abdominal US 5/23 w/ nodular hepatic parenchymal pattern with increased echogenicity consistent with the patient's known cirrhosis. No focal hepatic abnormality identified. Portal vein is patent. Moderate ascites. GI has seen, patient had 4L paracentesis on 5/26. - He may eventually need repeat paracentesis.  6. Coagulopathy: Post-op bleeding in setting of coagulopathy post-CABG in patient with cirrhosis.  Currently seems controlled.  Platelets dropped then recovered, doubt HIT.  7. VT: Had after PEA arrest am 6/1.  -  Continue amiodarone 200 bid per tube.  8. Anemia: ABLA.  Hgb stable at 10.  9. Pleural effusions:  ?hepatic hydrothorax.    - Left effusion on last CXR, Too small to tap er CCM 10. PEA arrest: Respiratory arrest post-extubation.  ROSC with CPR.  11. Acute hypoxemic respiratory failure:  Extubated 6/2, re-intubated 6/9. Self-extubated overnight 6/12 and re-intubated. Extubated 6/15 then re-intubated. Now s/p tracheostomy.  12. Atrial arrhythmias: Atypical atrial flutter currently.   - Continue amiodarone per tube.  - On heparin gtt - Eventual DCCV.  13. ID:  Suspected HCAP with septic shock. Cultures with Staph epidermidis 1/2, suspect contaminant.  - Finished abx 6/16 - WBC climbing again. Mental status worse. Co-ox high - Suspect recurrent sepsis. D/w CCM. ID has been reconsulted.  14. Neuro: Altered mental status 6/9 with intubation.  CT head with no bleed or ischemic CVA seen.  EEG with diffuse encephalopathy.  6/15: sudden onset of unresponsiveness with no clear etiology. EEG unremarkable. Has been alert and following commands. Now agitated and altered likely due to recurrent sepsis - continue supportive care  CRITICAL CARE Performed by: Glori Bickers  Total critical care time: 35 minutes  Critical care time was exclusive of separately billable procedures and treating other patients.  Critical care was necessary to treat or prevent imminent or life-threatening deterioration.  Critical care was time spent personally by me on the following activities: development of treatment plan with patient and/or surrogate as well as nursing, discussions with consultants, evaluation of patient's response to treatment, examination of patient, obtaining history from patient or surrogate, ordering and performing treatments and interventions, ordering and review of laboratory studies, ordering and review of radiographic studies, pulse oximetry and re-evaluation of patient's condition.  Glori Bickers MD 04/10/2021 10:19 AM

## 2021-04-10 NOTE — Plan of Care (Signed)
  Problem: Clinical Measurements: Goal: Ability to maintain clinical measurements within normal limits will improve Outcome: Progressing Goal: Will remain free from infection Outcome: Progressing Goal: Diagnostic test results will improve Outcome: Progressing Goal: Respiratory complications will improve Outcome: Progressing Goal: Cardiovascular complication will be avoided Outcome: Progressing   Problem: Nutrition: Goal: Adequate nutrition will be maintained Outcome: Progressing   Problem: Elimination: Goal: Will not experience complications related to bowel motility Outcome: Progressing Goal: Will not experience complications related to urinary retention Outcome: Progressing   Problem: Pain Managment: Goal: General experience of comfort will improve Outcome: Progressing   Problem: Safety: Goal: Ability to remain free from injury will improve Outcome: Progressing   Problem: Skin Integrity: Goal: Risk for impaired skin integrity will decrease Outcome: Progressing   Problem: Cardiac: Goal: Will achieve and/or maintain hemodynamic stability Outcome: Progressing   Problem: Clinical Measurements: Goal: Postoperative complications will be avoided or minimized Outcome: Progressing   Problem: Respiratory: Goal: Respiratory status will improve Outcome: Progressing   Problem: Skin Integrity: Goal: Wound healing without signs and symptoms of infection Outcome: Progressing Goal: Risk for impaired skin integrity will decrease Outcome: Progressing   Problem: Safety: Goal: Non-violent Restraint(s) Outcome: Progressing

## 2021-04-10 NOTE — Progress Notes (Signed)
ANTICOAGULATION CONSULT NOTE  Pharmacy Consult for heparin Indication: atrial flutter  Allergies  Allergen Reactions   Shellfish-Derived Products Anaphylaxis   Wasp Venom Protein Anaphylaxis   Penicillins Other (See Comments)    Childhood reaction    Patient Measurements: Height: '6\' 2"'$  (188 cm) Weight: 65.7 kg (144 lb 13.5 oz) IBW/kg (Calculated) : 82.2 Heparin Dosing Weight: 79kg  Vital Signs: Temp: 99.3 F (37.4 C) (06/18 0345) Temp Source: Axillary (06/18 0345) BP: 98/44 (06/18 0900) Pulse Rate: 103 (06/18 0900)  Labs: Recent Labs    04/08/21 0427 04/08/21 1636 04/09/21 0029 04/09/21 1600 04/10/21 0037  HGB 10.5*  --  10.1*  --  9.7*  HCT 32.2*  --  32.4*  --  31.4*  PLT 185  --  237  --  208  HEPARINUNFRC 0.10*  --  0.44  --  0.51  CREATININE 1.24   < > 1.37* 1.18 1.16   < > = values in this interval not displayed.     Estimated Creatinine Clearance: 66.9 mL/min (by C-G formula based on SCr of 1.16 mg/dL).   Medical History: Past Medical History:  Diagnosis Date   ESRD (end stage renal disease) ([place])    Hypertension    Peripheral arterial disease ([place])    Type 2 diabetes mellitus ([place])    Assessment: 30 YOF with recent CABG now with postop afib/flutter. HIT negative per labs.   Heparin level slightly above goal at 0.51 - running peripheral, being drawn peripheral (unable to do foot stick, no alternative access for drawing at this time). CBC stable.  Goal of Therapy:  Heparin level 0.3-0.5  units/ml  Monitor platelets by anticoagulation protocol: Yes   Plan:  Reduce heparin infusion slightly to 2750 units/hr Monitor daily HL, CBC, and for s/sx of bleeding F/u if able to get alternative central access in order to draw heparin levels centrally  Arrie Senate, PharmD, BCPS, [facility] Clinical Pharmacist [phone] Please check AMION for all [place] numbers 04/10/2021

## 2021-04-10 NOTE — Progress Notes (Signed)
NAME:  David Cashman., MRN:  [id number], DOB:  03/02/1966, LOS: 26 ADMISSION DATE:  03/03/2021, CONSULTATION DATE:  03/25/2021 REFERRING MD:  TCTS CHIEF COMPLAINT: Code   History of Present Illness:  55 year old man with PMHx significant for HTN, T2DM, PAD, ESRD, cirrhosis (query cardiac etiology) who presented for symptomatic ischemic cardiomyopathy.    Patient underwent preoperative optimization with CHF, GI, and Nephrology teams with eventual CABG 5/31. On 6/1, patient developed respiratory distress followed by PEA arrest requiring CPR. Code was c/b increased bleeding around surgical sites, prompting mediastinal exploration 6/1. Intraoperative findings were notable for bleeding around mammary graft site and diffuse oozing for which patient received multiple blood products.   Patient intubated and on pressors postoperatively and PCCM was consulted for assistance with management.   Significant Hospital Events: Including procedures, antibiotic start and stop dates in addition to other pertinent events   5/23 Admitted 5/26 Paracentesis 4L 5/30 HD 5/31 CABG 5/31- 6/1 Code Blue, OR for mediastinal exploration 2/2 hemorrhagic shock induced by CPR. Factor 7, protamine, FFP, cryo given 6/1 PCCM consult, 4U PRBCs and 1U Plt . R pigtail chest tube placed due to limitation of vent weaning. 6/2 Extubated, chest tube 3.2L out past 24 hrs 6/3 Remains on 2L [place], chest tube with 500cc out/ 24 hrs 6/4 Remains on CRRT, 2L [place], milrinone, NE. 3 chest tubes remain. 6/6 Remains on CRRT, 4 chest tubes in place 6/9 Decreasing pressor requirements and CT output. CRRT transitioned to iHD. Echo repeated. Prior to HD session, patient suddenly became unresponsive with fixed and dilated pupils, minimal gag, breathing spontaneously. Code Stroke called. Reintubated, heparin d/c'ed, STAT CT Head/CTA. 6/10, WBC jump to 89 K, Temp of 102,Remains on 40 of Levo, 40 of Neo, and Vaso at 0.03.Pupils are 6 and non-responsive to  light. Trialysis cath inserted for CVVHD. R IJ is 61 days old, will get PICC and discontinue. Lactate of 2.8. Pigtail cath removed  6/12 coming down on pressors? Patient self extubated and reintubated 6/13 MRI negative for acute findings 6/15 Patient extubated; spoke with family and patient, if he fails extubation trial and requires reintubation they are okay with trach placement. 6/16 placed on cont sedation for trach. Trach completed. Started on LTM for episodes of being unresponsive.  IV cefepime stopped, completing course of antibiotics for pneumonia no organism specified 6/17 fent gtt stopped. Was actually fully awake even on fent gtt. Beginning ATC trials w/ plan for mandatory vent at night.  6/18   Interim History / Subjective:  Started spiking fever, white count started trending up again from 24-28 Yesterday he tolerated trach collar for 12 hours, this morning he became tachypneic and hypoxic within half an hour of putting him on trach collar Currently very agitated Levophed requirement went up   Objective   Blood pressure (!) 98/44, pulse (!) 103, temperature 99.3 F (37.4 C), temperature source Axillary, resp. rate (!) 23, height '6\' 2"'  (1.88 m), weight 65.7 kg, SpO2 99 %. 4LNC    Vent Mode: PRVC FiO2 (%):  [40 %] 40 % Set Rate:  [20 bmp] 20 bmp Vt Set:  [570 mL] 570 mL PEEP:  [5 cmH20] 5 cmH20 Plateau Pressure:  [17 cmH20-21 cmH20] 21 cmH20   Intake/Output Summary (Last 24 hours) at 04/10/2021 1103 Last data filed at 04/10/2021 0900 Gross per 24 hour  Intake 2872.46 ml  Output 4715 ml  Net -1842.54 ml   Filed Weights   04/08/21 0451 04/09/21 0335 04/10/21 0500  Weight: 71.5  kg 71.4 kg 65.7 kg   Physical Examination: General: Critically ill looking middle-aged Caucasian male, lying in the bed, s/p trach HEENT: Atraumatic, normocephalic temporal wasting, no JVD, mucous membranes moist.  New tracheostomy unremarkable. Pulmonary: Bilateral rhonchi and crackles, no  wheezes Cardiac: Regular rate and rhythm no murmur rub or gallop Abdomen soft not tender his tube feeds have been on hold will resume these today Extremities are warm dry pulses are palpable does have some dependent edema.  His right AV fistula has a strong bruit and thrill Neuro awake, agitated, following intermittently.  Moves all extremities.  No focal deficits appreciated.  Labs/imaging that I have personally reviewed: (right click and "Reselect all SmartList Selections" daily)   See below   Resolved Hospital Problem list   Fever Post-op mediastinal hemorrhage Cirrhotic and hemorrhage- induced consumptive coagulopathy Hemorrhagic shock, resolved Acute blood loss anemia Thrombocytopenia- in setting of blood loss, coagulopathy, and cirrhosis Elevated Alk Phos/AST VT 6/1 Assessment & Plan:   CAD post CABG c/b  in hospital PEA cardiac arrest precipitated by hypoxia and blood loss Cardiac Cirrhosis with ascites CooX has improved Continuing to wean pressors Continue aspirin and atorvastatin  Multifactorial shock Initially hemorrhagic and cardiogenic Now likely septic shock considering improving central venous oxygen, increasing white count, fever and bilateral pulmonary infiltrates Vasopressor requirement has increased from 10-20 Levophed We will get respiratory culture Restarted on meropenem Infectious disease was reconsulted  Chronic Afib/flutter On amiodarone Continue heparin infusion Continue telemetry monitoring  Acute metabolic encephalopathy Patient had multiple episodes of unresponsiveness with fixed and dilated pupil MRI brain did not show any acute abnormalities EEG is negative for seizure Mental status has improved significantly, now he is a little agitated We will start him on low-dose Precedex Avoid other sedation  Acute hypoxemic respiratory failure due to inability to protect airway Pulmonary edema and Bilateral pleural effusions (chest tubes removed.) and   LLL HAP vs pleural effusion Status post tracheostomy 6/16. Repeat x-ray chest is suggestive of bilateral persistent infiltrates Continue lung protective ventilation This morning patient did not tolerate pressure support trial, had to be put back on full vent support due to tachypnea and hypoxia Restarted back on IV meropenem Bedside ultrasonography was done showed minimal left-sided pleural effusion, no safe window to drain the effusion Continue routine tracheostomy care  Aggressive pulmonary toilet  Continue DuoNebs  Get him out of bed   ESRD on HD PTA: known IJ stenosis and concern of R IJ stenosis; difficult access, RUE AVF Trialysis catheter placed 6/10 Continuing CRRT Hopefully can transition to intermittent dialysis, contacted interventional radiology in regards to tunneled catheter, they would like to hold off given leukocytosis which seems reasonable.  If we can get him off from pressors we may be able to utilize his graft instead  Hyperkalemia improved Check and replace electrolytes as indicated  DM2  Continue sliding scale insulin and Lantus  Sacral pressure ulcer Continue wound care as addressed by wound ostomy team  Best Practice (right click and "Reselect all SmartList Selections" daily)   Diet/type: tubefeeds Pain/Anxiety/Delirium protocol Yes and RASS goal: 0 VAP protocol (if indicated): Yes DVT prophylaxis: systemic heparin GI prophylaxis: PPI Glucose control:  SSI and Basal coverage Central venous access:  Yes, and it is still needed Arterial line:  N/A Foley:  N/A Mobility:  OOB  PT consulted: Yes Studies pending: None Code Status:  full code Last date of multidisciplinary goals of care discussion [per primary ] placing care management consult. Disposition will be a  challenge  Serious Disposition: remains critically ill, will stay in intensive care    Total critical care time: 41 minutes  Performed by: [place] care time was  exclusive of separately billable procedures and treating other patients.   Critical care was necessary to treat or prevent imminent or life-threatening deterioration.   Critical care was time spent personally by me on the following activities: development of treatment plan with patient and/or surrogate as well as nursing, discussions with consultants, evaluation of patient's response to treatment, examination of patient, obtaining history from patient or surrogate, ordering and performing treatments and interventions, ordering and review of laboratory studies, ordering and review of radiographic studies, pulse oximetry and re-evaluation of patient's condition.   Jacky Kindle MD [facility] See Amion for pager If no response to pager, please call [phone] until 7pm After 7pm, Please call E-link [phone]

## 2021-04-10 NOTE — Progress Notes (Signed)
David Rose Progress Note   Assessment/ Plan:   Outpatient dialysis prescription: [facility] [phone] 4 Hours, 180 dialyzer, 2K/2.5 Ca, DFR 500, BFR 400 Use AVF; graft on inner arm, 15 gauge Heparin loading dose 2000 and hourly dose of 1200 EDW 78.5 kg Last post weight 78 kg on 5/21 (went in at 78 and came out at 78 kg) Meds: epogen 2400 units each tx venofer 50 mg weekly Not on hectorol or calcitriol sensipar 30 mg three times a week with HD   Assessment/Plan:   Septic shock- on broad spectrum abx- Flagyl, cefepime.  Eraxis and flagyl stopped 04/05/21.  ID following.  Pressors per PCCM, requirement up now Acute hypoxic RF: intubated per PCCM--> failed extubation 6/15, now back intubated.  Trached 6/16. ESRD - normally [place] dialysis in [place], last  outpatient HD 5/30 now on CRRT since 04/14/2021 following CABG complicated by PEA arrest and need for pressors.   - CRRT 04/10/2021-03/30/21 and temp HD catheter removed at that time.       - CRRT resumed 04/02/21 due to acute decompensation and profound hypotension, has L femoral temporary HD cath       - decrease UF rate 50-100 mL/ hr      - Please note that this pt is ESRD and PICC/ midline is not appropriate--> additional triple lumen/ CVC would be OK      - trach/ dialysis dispo will be complicated      - keep even today due to increased pressor requirement  CAD s/p CABG x 4 complicated by PEA arrest.  Acute on chronic CHF with cardiogenic shock post CABG -midodrine 15 TID  Repeat ECHO EF 45-50%, RV function moderately reduced. Pulmonary HTN - per HF team Cirrhosis - presumably cardiogenic.  S/p paracentesis on 03/18/21. 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  Hypophosphatemia - repleted with IV phos and follow.  Has required multiple phos repletions- will see if Neutraphos is too gritty to get down the cortrak, appreciate pharmacy Afib/ flutter: on systemic heparin gtt and  amiodarone Dispo: ICU  Subjective:    Levophed up to 20 again this AM.  Trialed on TC today but quickly desatted so was placed back on the vent.    Objective:   BP (!) 98/44   Pulse (!) 103   Temp 99.3 F (37.4 C) (Axillary)   Resp (!) 23   Ht _0  (1.88 m)   Wt 65.7 kg   SpO2 99%   BMI 18.60 kg/m   Physical Exam: Gen: ill appearing, cachectic, looks exhausted NECK: trach in place, c/d/i CVS: irregular, tachycardic Resp: coarse mechanical bilaterally Abd: soft Ext: sarcopenic, dependent thigh edema improved ACCESS: L nontunneled femoral HD catheter, R UE AVF + T/B  Labs: BMET Recent Labs  Lab 04/07/21 1504 04/07/21 1924 04/07/21 2002 04/08/21 0427 04/08/21 1636 04/09/21 0029 04/09/21 1600 04/10/21 0037  NA 135 134* 135 135 133* 134* 134* 136  K 4.5 6.2* 4.5 5.5* 4.1 4.7 4.5 4.5  CL 101 99  --  100 98 100 100 102  CO2 27 21*  --  _1 GLUCOSE 124* 219*  --  123* 238* 180* 223* 127*  BUN 18 20  --  19 20 21* 27* 30*  CREATININE 1.33* 1.25*  --  1.24 1.49* 1.37* 1.18 1.16  CALCIUM 7.7* 8.0*  --  8.4* 8.1* 8.2* 8.1* 8.4*  PHOS 4.5 4.0  --  1.7* 3.1 3.4 2.9 1.9*   CBC Recent Labs  Lab 04/04/21 0123 04/05/21 0401 04/07/21 1924 04/07/21 2002 04/08/21 0427 04/09/21 0029 04/10/21 0037  WBC 71.3*   < > 29.5*  --  24.4* 26.8* 28.0*  NEUTROABS 64.6*  --  28.0*  --   --   --   --   HGB 9.1*   < > 11.7* 11.9* 10.5* 10.1* 9.7*  HCT 27.0*   < > 36.8* 35.0* 32.2* 32.4* 31.4*  MCV 89.1   < > 97.6  --  94.2 98.5 100.6*  PLT 291   < > 215  --  185 237 208   < > = values in this interval not displayed.      Medications:     amiodarone  200 mg Per Tube BID   aspirin  81 mg Per Tube Daily   atorvastatin  40 mg Per Tube Daily   bisacodyl  10 mg Oral Daily   Or   bisacodyl  10 mg Rectal Daily   chlorhexidine gluconate (MEDLINE KIT)  15 mL Mouth Rinse BID   Chlorhexidine Gluconate Cloth  6 each Topical Q0600   collagenase   Topical Daily    darbepoetin (ARANESP) injection - NON-DIALYSIS  60 mcg Subcutaneous Q Thu-1800   docusate  100 mg Per Tube BID   feeding supplement (PROSource TF)  45 mL Per Tube QID   insulin aspart  0-24 Units Subcutaneous Q4H   insulin glargine  10 Units Subcutaneous Daily   ipratropium-albuterol  3 mL Nebulization TID   lidocaine-EPINEPHrine  30 mL Intradermal Once   mouth rinse  15 mL Mouth Rinse 10 times per day   midodrine  30 mg Per Tube TID WC   multivitamin  1 tablet Per Tube QHS   nutrition supplement (JUVEN)  1 packet Oral BID BM   pantoprazole sodium  40 mg Per Tube Daily   polyethylene glycol  17 g Per Tube Daily   QUEtiapine  25 mg Oral QHS   sodium chloride flush  10-40 mL Intracatheter Q12H   sodium chloride flush  3 mL Intravenous Q12H   Thrombi-Pad  1 each Topical Once     Madelon Lips, MD 04/10/2021, 10:06 AM

## 2021-04-10 NOTE — Progress Notes (Signed)
RT NOTE: RT removed ATC and placed patient back on full support through ventilator due to increased HR, desating into the 80's, and increased WOB. Vitals are stable with sats now 100%. RT will continue to monitor.

## 2021-04-10 NOTE — Progress Notes (Signed)
[facility] Progress Note Patient Name: David Rose. DOB: [birth year] MRN: [id number]   Date of Service  04/10/2021  HPI/Events of Note  Patient with agitated delirium, he attempted to pull out his dialysis catheter.  eICU Interventions  Seroquel 25 mg via NG tube Q HS ordered, bilateral soft wrist restraints ordered as well.        [name] Stryker Veasey 04/10/2021, 2:28 AM

## 2021-04-10 NOTE — Progress Notes (Signed)
Neurology Progress Note  Brief HPI: 55 y.o. male with PMHx of HTN, DM2, ESRD on HD, chronic systolic heart failure, cirrhosis, CAD, cardiac arrest s/p CABG. He had 2 inpatient episodes of sudden unresponsiveness requiring intubation and subsequent tracheostomy placement.    Subjective: No acute overnight events LTM continues to show mild diffuse encephalopathy without seizures or epileptiform discharges  Exam: Vitals:   04/10/21 0837 04/10/21 0900  BP:  (!) 98/44  Pulse:  (!) 103  Resp:  (!) 23  Temp:    SpO2: 100% 99%   Gen: Sitting up in bed with agitated movements, in no acute distress Resp: tracheostomy in place midline neck, on mechanical ventilator Abd: soft, non-distended  Neuro: MS: Awake with agitated movements of each extremity. Initially appears to be mouthing words but is inconsistent and does not attempt further to communicate with examiner. He does not nod or shake his head to orientation questions. He does not follow commands on examination, however, bedside RN says he intermittently and inconsistently follows commands throughout the day.  CN: Resists examiner eye opening, pupils 7 mm, equal, round, sluggish reaction to light, patient attends to stimuli intermittently in lateral visual fields. Face appears symmetric, head is midline.  Motor: Moves each extremity restlessly without asymmetry noted. He does not follow commands for full strength testing. Tone is normal / increased with agitated movements. Bulk is decreased.  Sensory: Unable to assess sensation to light touch throughout but patient briskly withdraws and increases agitated movements with application of noxious stimuli. Plantars: Mute bilaterally Gait: Deferred  Pertinent Labs: CBC    Component Value Date/Time   WBC 28.0 (H) 04/10/2021 0037   RBC 3.12 (L) 04/10/2021 0037   HGB 9.7 (L) 04/10/2021 0037   HCT 31.4 (L) 04/10/2021 0037   PLT 208 04/10/2021 0037   MCV 100.6 (H) 04/10/2021 0037   MCH 31.1  04/10/2021 0037   MCHC 30.9 04/10/2021 0037   RDW 24.1 (H) 04/10/2021 0037   LYMPHSABS 0.6 (L) 04/07/2021 1924   MONOABS 0.9 04/07/2021 1924   EOSABS 0.0 04/07/2021 1924   BASOSABS 0.0 04/07/2021 1924   CMP     Component Value Date/Time   NA 136 04/10/2021 0037   K 4.5 04/10/2021 0037   CL 102 04/10/2021 0037   CO2 27 04/10/2021 0037   GLUCOSE 127 (H) 04/10/2021 0037   BUN 30 (H) 04/10/2021 0037   CREATININE 1.16 04/10/2021 0037   CALCIUM 8.4 (L) 04/10/2021 0037   PROT 7.0 04/07/2021 1924   ALBUMIN 2.1 (L) 04/10/2021 0037   AST 41 04/07/2021 1924   ALT 21 04/07/2021 1924   ALKPHOS 202 (H) 04/07/2021 1924   BILITOT 2.1 (H) 04/07/2021 1924   GFRNONAA >60 04/10/2021 0037   Lab Results  Component Value Date   INR 1.3 (H) 04/01/2021   INR 1.5 (H) 03/31/2021   INR 1.4 (H) 03/30/2021   Imaging Reviewed:  MRI brain without contrast 04/06/2021: 1. No acute intracranial abnormality. 2. 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 3. Bilateral mastoid effusions and fluid in the nasopharynx.  Overnight EEG 04/09/2021 - 04/10/2021: "This study was initially suggestive of moderate diffuse encephalopathy, nonspecific etiology which gradually improved to mild diffuse encephalopathy. No seizures or epileptiform discharges were seen throughout the recording."  Assessment: 55 year old male with h/o hypertension, diabetes, end-stage renal disease, chronic systolic heart failure, cirrhosis, coronary artery disease, cardiac arrest status post CABG who has had at least 2 episodes of sudden unresponsiveness on 04/01/2021, 04/07/2021.  -  Has been on LTM EEG for 48 hours, with findings suggestive of mild to moderate diffuse encephalopathy, nonspecific etiology for first 24 hours, then gradual improvement to a mild diffuse encephalopathy on second 24 hours. - Plan was to continue LTM for 72 hours and if no seizures by Sunday AM, to discontinue LTM.  - Semiology of episodes,  h/o significant cardiac comorbidities, no ictal-interictal eeg abnormality so far. Therefore, more likely these episodes are cardiogenic. However, patient has had more than one episode and therefore we will continue video EEG monitoring for another 24 hours (until Sunday AM) to look for any ictal-interictal abnormality.  Recommendations: - Continue LTM EEG for 24 hours to look for any ictal-interictal abnormality; if no seizures or epileptiform discharges noted on EEG, will discontinue on 04/10/2021 - Continue to minimize sedation - No clear evidence of epileptogenicity so far, therefore would not start any AEDs -Continue to minimize sedation -Continue management of alcohol use per primary team    Anibal Henderson, AGACNP-BC [facility] [phone]  Electronically signed: Dr. Kerney Elbe

## 2021-04-10 NOTE — Progress Notes (Signed)
Pt placed back on full vent support setting for the night per order.

## 2021-04-10 NOTE — Procedures (Addendum)
Patient Name: David Rose.  MRN: [id number]  Epilepsy Attending: Lora Havens  Referring Physician/Provider: Dr Kerney Elbe Duration: 04/09/2021 [id number] to 04/10/2021 0934   Patient history: 55 yo M with sudden onset of unresponsiveness with no clear etiology. EEG to evaluate for status epilepticus.    Level of alertness:  awake, asleep   AEDs during EEG study: None   Technical aspects: This EEG study was done with scalp electrodes positioned according to the 10-20 International system of electrode placement. Electrical activity was acquired at a sampling rate of '500Hz'$  and reviewed with a high frequency filter of '70Hz'$  and a low frequency filter of '1Hz'$ . EEG data were recorded continuously and digitally stored.    Description: EEG showed posterior dominant rhythm of 9-10 Hz activity of moderate voltage (25-35 uV) seen predominantly in posterior head regions, symmetric and reactive to eye opening and eye closing. Sleep was characterized by sleep spindles (12-'14hz'$ ) maximal frontocentral region. Intermittent generalized 3 to 6 Hz theta- delta slowing was also noted.  Hyperventilation and photic stimulation were not performed.      IMPRESSION: This study is suggestive of mild diffuse encephalopathy. No seizures or epileptiform discharges were seen throughout the recording.   Keshav Winegar Barbra Sarks

## 2021-04-10 NOTE — Progress Notes (Signed)
LTM maintenance completed; no skin breakdown was seen.

## 2021-04-10 NOTE — Progress Notes (Signed)
      [address]       [place] [address]             [phone]        CARDIOTHORACIC SURGERY PROGRESS NOTE   R17 Days Post-Op Procedure(s) (LRB): EXPLORATION POST OPERATIVE OPEN HEART (N/A)  Subjective: Reportedly more agitated this morning requiring IV sedation.  Didn't tolerate TC trial - now back on full vent support.    Objective: Vital signs: BP Readings from Last 1 Encounters:  04/10/21 (!) 98/44   Pulse Readings from Last 1 Encounters:  04/10/21 (!) 103   Resp Readings from Last 1 Encounters:  04/10/21 (!) 23   Temp Readings from Last 1 Encounters:  04/10/21 99.3 F (37.4 C) (Axillary)    Hemodynamics:    Physical Exam:  Rhythm:   sinus  Breath sounds: coarse  Heart sounds:  RRR  Incisions:  Clean and dry  Abdomen:  Soft, non-distended, non-tender, tolerating tube feeds  Extremities:  Warm, adequately perfused, looks dry   Intake/Output from previous day: 06/17 0701 - 06/18 0700 In: 5586.6 [P.O.:100; I.V.:1346.6; NG/GT:4140] Out: [id number] [Emesis/NG output:60] Intake/Output this shift: Total I/O In: 233.3 [I.V.:103.3; NG/GT:130] Out: 355 [Other:355]  Lab Results:  CBC: Recent Labs    04/09/21 0029 04/10/21 0037  WBC 26.8* 28.0*  HGB 10.1* 9.7*  HCT 32.4* 31.4*  PLT 237 208    BMET:  Recent Labs    04/09/21 1600 04/10/21 0037  NA 134* 136  K 4.5 4.5  CL 100 102  CO2 27 27  GLUCOSE 223* 127*  BUN 27* 30*  CREATININE 1.18 1.16  CALCIUM 8.1* 8.4*     PT/INR:  No results for input(s): LABPROT, INR in the last 72 hours.  CBG (last 3)  Recent Labs    04/09/21 2346 04/10/21 0341 04/10/21 0855  GLUCAP 96 251* 135*    ABG    Component Value Date/Time   PHART 7.406 04/07/2021 2002   PCO2ART 42.6 04/07/2021 2002   PO2ART 326 (H) 04/07/2021 2002   HCO3 26.9 04/07/2021 2002   TCO2 28 04/07/2021 2002   ACIDBASEDEF 10.0 (H) 04/02/2021 0352   O2SAT 100.0 04/07/2021 2002    CXR: PORTABLE CHEST 1 VIEW    COMPARISON:  04/08/2021   FINDINGS: Again noted is a tracheostomy tube and feeding tube. The feeding tube extends beyond the image. Patchy and hazy densities throughout the left hemithorax are similar to the recent comparison examination. Persistent opacity or consolidation at the medial left lung base. Few densities at the medial right lung base but otherwise the right lung is clear.   IMPRESSION: 1. Stable chest radiograph findings. 2. No significant change in the densities throughout the left lung. Findings could represent a combination of pleural fluid and airspace disease.     Electronically Signed   By: Markus Daft M.D.   On: 04/10/2021 09:44  Assessment/Plan: S/P Procedure(s) (LRB): EXPLORATION POST OPERATIVE OPEN HEART (N/A)  Slow progress Stable hemodynamics although still on levophed for BP support Remains VDRF with little progress in vent weaning, CXR appearance stable Tolerating tube feeds Tolerating CRRT - now keeping volume status even WBC slowly climbing, no fevers  Rexene Alberts, MD 04/10/2021 11:12 AM

## 2021-04-11 ENCOUNTER — Inpatient Hospital Stay (HOSPITAL_COMMUNITY): Payer: Medicare Other

## 2021-04-11 DIAGNOSIS — R570 Cardiogenic shock: Secondary | ICD-10-CM | POA: Diagnosis not present

## 2021-04-11 DIAGNOSIS — R6521 Severe sepsis with septic shock: Secondary | ICD-10-CM | POA: Diagnosis not present

## 2021-04-11 DIAGNOSIS — A419 Sepsis, unspecified organism: Secondary | ICD-10-CM | POA: Diagnosis not present

## 2021-04-11 DIAGNOSIS — R4189 Other symptoms and signs involving cognitive functions and awareness: Secondary | ICD-10-CM | POA: Diagnosis not present

## 2021-04-11 DIAGNOSIS — I5023 Acute on chronic systolic (congestive) heart failure: Secondary | ICD-10-CM | POA: Diagnosis not present

## 2021-04-11 DIAGNOSIS — R4182 Altered mental status, unspecified: Secondary | ICD-10-CM | POA: Diagnosis not present

## 2021-04-11 LAB — RENAL FUNCTION PANEL
Albumin: 2 g/dL — ABNORMAL LOW (ref 3.5–5.0)
Albumin: 2 g/dL — ABNORMAL LOW (ref 3.5–5.0)
Anion gap: 7 (ref 5–15)
Anion gap: 8 (ref 5–15)
BUN: 29 mg/dL — ABNORMAL HIGH (ref 6–20)
BUN: 32 mg/dL — ABNORMAL HIGH (ref 6–20)
CO2: 26 mmol/L (ref 22–32)
CO2: 24 mmol/L (ref 22–32)
Calcium: 8.3 mg/dL — ABNORMAL LOW (ref 8.9–10.3)
Calcium: 8.2 mg/dL — ABNORMAL LOW (ref 8.9–10.3)
Chloride: 102 mmol/L (ref 98–111)
Chloride: 103 mmol/L (ref 98–111)
Creatinine, Ser: 1.35 mg/dL — ABNORMAL HIGH (ref 0.61–1.24)
Creatinine, Ser: 1.33 mg/dL — ABNORMAL HIGH (ref 0.61–1.24)
GFR, Estimated: >60 mL/min (ref >60)
GFR, Estimated: >60 mL/min (ref >60)
Glucose, Bld: 263 mg/dL — ABNORMAL HIGH (ref 70–99)
Glucose, Bld: 131 mg/dL — ABNORMAL HIGH (ref 70–99)
Phosphorus: 1.5 mg/dL — ABNORMAL LOW (ref 2.5–4.6)
Phosphorus: 2.1 mg/dL — ABNORMAL LOW (ref 2.5–4.6)
Potassium: 4.8 mmol/L (ref 3.5–5.1)
Potassium: 5 mmol/L (ref 3.5–5.1)
Sodium: 135 mmol/L (ref 135–145)
Sodium: 135 mmol/L (ref 135–145)

## 2021-04-11 LAB — CBC
HCT: 34.3 % — ABNORMAL LOW (ref 39.0–52.0)
Hemoglobin: 10.4 g/dL — ABNORMAL LOW (ref 13.0–17.0)
MCH: 30.9 pg (ref 26.0–34.0)
MCHC: 30.3 g/dL (ref 30.0–36.0)
MCV: 101.8 fL — ABNORMAL HIGH (ref 80.0–100.0)
Platelets: 266 10*3/uL (ref 150–400)
RBC: 3.37 MIL/uL — ABNORMAL LOW (ref 4.22–5.81)
RDW: 24 % — ABNORMAL HIGH (ref 11.5–15.5)
WBC: 29 10*3/uL — ABNORMAL HIGH (ref 4.0–10.5)
nRBC: 0.4 % — ABNORMAL HIGH (ref 0.0–0.2)

## 2021-04-11 LAB — GLUCOSE, CAPILLARY
Glucose-Capillary: 192 mg/dL — ABNORMAL HIGH (ref 70–99)
Glucose-Capillary: 216 mg/dL — ABNORMAL HIGH (ref 70–99)
Glucose-Capillary: 190 mg/dL — ABNORMAL HIGH (ref 70–99)
Glucose-Capillary: 141 mg/dL — ABNORMAL HIGH (ref 70–99)
Glucose-Capillary: 165 mg/dL — ABNORMAL HIGH (ref 70–99)

## 2021-04-11 LAB — HEPARIN LEVEL (UNFRACTIONATED): Heparin Unfractionated: 0.39 IU/mL (ref 0.30–0.70)

## 2021-04-11 LAB — MAGNESIUM: Magnesium: 2.5 mg/dL — ABNORMAL HIGH (ref 1.7–2.4)

## 2021-04-11 MED ORDER — QUETIAPINE FUMARATE 25 MG PO TABS
25.0000 mg | ORAL_TABLET | Freq: Two times a day (BID) | ORAL | Status: DC
  Administered 2021-04-11 – 2021-04-12 (×3): 25 mg
  Filled 2021-04-11 (×3): qty 1

## 2021-04-11 MED ORDER — ALBUTEROL SULFATE (2.5 MG/3ML) 0.083% IN NEBU: 2.5000 mg | INHALATION_SOLUTION | RESPIRATORY_TRACT | Status: DC

## 2021-04-11 MED ORDER — ALBUMIN HUMAN 5 % IV SOLN
12.5000 g | Freq: Once | INTRAVENOUS | Status: AC
  Administered 2021-04-11: 12.5 g via INTRAVENOUS
  Filled 2021-04-11: qty 250

## 2021-04-11 NOTE — Progress Notes (Signed)
Pt placed on ATC 8L 40%. Mildly labored but tolerating okay at this time. HR 101, RR 26-30. SATs maintaining 100%. Per PCCM order, goal is 1-2hrs on ATC. RT will continue to monitor.

## 2021-04-11 NOTE — Progress Notes (Signed)
Pt placed back on PS/CPAP on the vent.

## 2021-04-11 NOTE — Progress Notes (Signed)
Patient ID: David Rose., male   DOB: 04/23/1966, 55 y.o.   MRN: [id number]      Advanced Heart Failure Rounding Note  PCP-Cardiologist: Carlyle Dolly, MD    Patient Profile   55 y/o male w/  history of ESRD due to DM and CHF with mid-range EF (LV EF 40-45% with moderately decreased RV systolic function and PASP 99 on 4/22 echo) as well as cirrhosis of uncertain etiology.  Based on low EF and elevated PA pressure, right and left heart cath was done in 5/22.  This showed severe 3VD, high cardiac output with moderate pulmonary hypertension but low PVR.  He is planned for CABG, but admitted pre-op for optimization.    Subjective:    Events  - 5/31 CABG x 4 with LIMA-LAD, SVG-RCA, SVG-OM, SVG-D.  - 5/31  Coagulopathic post-op with multiple blood products.  Extubated post-op but developed respiratory distress => PEA arrest then VT. Reintubated.   - 6/1 Returned to OR for mediastinal exploration - 6/2  Extubated - 6/9 AMS--> CT negative for acute bleed or ischemic CVA. Intubated. Suspected septic shock. CVVH restarted. - 6/15: Extubated but later in the day had sudden onset of unresponsiveness re-intubated. EEG negative. NH3 18.  - 6/16 trach  - 6/16 abx stopped Finished Flagyl/Eraxis/cefepime for HCAP/septic shock.  - 6/18 meropenum started  Confused yesterday with rising WBCs. Meropenum started. He is improved this am. Awake. Following commands but very weak. Concern over unequal pupils L>R this am.   Remains on CVVHD.Running even.  NE up 20 -> 24   On heparin gtt and amiodarone for AFL. HR 90-110   Echo (6/9) with EF 45-50%, inferolateral severe hypokinesis, moderate RV dysfunction.    Objective:   Weight Range: 65 kg Body mass index is 18.4 kg/m.   Vital Signs:   Temp:  [98 F (36.7 C)-100.4 F (38 C)] 98 F (36.7 C) (06/19 0700) Pulse Rate:  [95-107] 105 (06/19 0900) Resp:  [13-32] 25 (06/19 0900) BP: (69-157)/(42-108) 111/51 (06/19 0900) SpO2:  [92 %-100 %] 100  % (06/19 0900) FiO2 (%):  [40 %] 40 % (06/19 0800) Weight:  [65 kg] 65 kg (06/19 0545) Last BM Date: 04/11/21  Weight change: Filed Weights   04/09/21 0335 04/10/21 0500 04/11/21 0545  Weight: 71.4 kg 65.7 kg 65 kg    Intake/Output:   Intake/Output Summary (Last 24 hours) at 04/11/2021 1002 Last data filed at 04/11/2021 0800 Gross per 24 hour  Intake 3179.2 ml  Output 2814 ml  Net 365.2 ml       Physical Exam   General:  Cachetic male lying in bed. Awake on vent will follow commands  HEENT: normal except left pupil > right Neck: supple. no JVD. + trach  Carotids 2+ bilat; no bruits. No lymphadenopathy or thryomegaly appreciated. Cor: PMI nondisplaced. Regular rate & rhythm. No rubs, gallops or murmurs. Lungs: clear Abdomen: soft, nontender, nondistended. No hepatosplenomegaly. No bruits or masses. Good bowel sounds. Extremities: no cyanosis, clubbing, rash, edema Large AVF RUE  Neuro:awake follows commands. Moves all 4  Wearing mittens  Telemetry   atypical flutter 90-110 - no change Personally reviewed  Labs    CBC Recent Labs    04/10/21 0037 04/11/21 0111  WBC 28.0* 29.0*  HGB 9.7* 10.4*  HCT 31.4* 34.3*  MCV 100.6* 101.8*  PLT 208 469    Basic Metabolic Panel Recent Labs    04/10/21 0037 04/10/21 1758 04/11/21 0111  NA 136 135 135  K 4.5 4.9 4.8  CL 102 100 102  CO2 '27 26 26  ' GLUCOSE 127* 194* 263*  BUN 30* 30* 29*  CREATININE 1.16 1.35* 1.35*  CALCIUM 8.4* 8.4* 8.3*  MG 2.5*  --  2.5*  PHOS 1.9* 1.6* 1.5*    Liver Function Tests Recent Labs    04/10/21 1758 04/11/21 0111  ALBUMIN 2.1* 2.0*    No results for input(s): LIPASE, AMYLASE in the last 72 hours. Cardiac Enzymes No results for input(s): CKTOTAL, CKMB, CKMBINDEX, TROPONINI in the last 72 hours.  BNP: BNP (last 3 results) Recent Labs    03/09/2021 1545  BNP 1,127.8*     ProBNP (last 3 results) No results for input(s): PROBNP in the last 8760 hours.   D-Dimer No  results for input(s): DDIMER in the last 72 hours.  Hemoglobin A1C No results for input(s): HGBA1C in the last 72 hours. Fasting Lipid Panel No results for input(s): CHOL, HDL, LDLCALC, TRIG, CHOLHDL, LDLDIRECT in the last 72 hours. Thyroid Function Tests No results for input(s): TSH, T4TOTAL, T3FREE, THYROIDAB in the last 72 hours.  Invalid input(s): FREET3   Other results:   Imaging    No results found.    Medications:     Scheduled Medications:  amiodarone  200 mg Per Tube BID   aspirin  81 mg Per Tube Daily   atorvastatin  40 mg Per Tube Daily   bisacodyl  10 mg Oral Daily   Or   bisacodyl  10 mg Rectal Daily   chlorhexidine gluconate (MEDLINE KIT)  15 mL Mouth Rinse BID   Chlorhexidine Gluconate Cloth  6 each Topical Q0600   collagenase   Topical Daily   darbepoetin (ARANESP) injection - NON-DIALYSIS  60 mcg Subcutaneous Q Thu-1800   docusate  100 mg Per Tube BID   feeding supplement (PROSource TF)  45 mL Per Tube QID   insulin aspart  0-24 Units Subcutaneous Q4H   insulin glargine  10 Units Subcutaneous Daily   ipratropium-albuterol  3 mL Nebulization TID   lidocaine-EPINEPHrine  30 mL Intradermal Once   mouth rinse  15 mL Mouth Rinse 10 times per day   midodrine  30 mg Per Tube TID WC   multivitamin  1 tablet Per Tube QHS   nutrition supplement (JUVEN)  1 packet Per Tube BID BM   pantoprazole sodium  40 mg Per Tube Daily   polyethylene glycol  17 g Per Tube Daily   QUEtiapine  25 mg Oral QHS   sodium chloride flush  10-40 mL Intracatheter Q12H   sodium chloride flush  3 mL Intravenous Q12H   Thrombi-Pad  1 each Topical Once    Infusions:   prismasol BGK 4/2.5 500 mL/hr at 04/11/21 0654    prismasol BGK 4/2.5 300 mL/hr at 04/11/21 0451   sodium chloride Stopped (04/10/21 0811)   dexmedetomidine (PRECEDEX) IV infusion Stopped (04/11/21 0519)   feeding supplement (VITAL 1.5 CAL) 1,000 mL (04/10/21 2134)   heparin 2,750 Units/hr (04/11/21 0800)    lactated ringers     lactated ringers Stopped (04/01/21 1902)   meropenem (MERREM) IV Stopped (04/11/21 0706)   norepinephrine (LEVOPHED) Adult infusion 24 mcg/min (04/11/21 0958)   prismasol BGK 4/2.5 1,500 mL/hr at 04/11/21 0427    PRN Medications: heparin, lactated ringers, lidocaine (PF), lidocaine HCl (PF), lip balm, metoprolol tartrate, morphine injection, ondansetron (ZOFRAN) IV, oxyCODONE, sodium chloride, sodium chloride flush     Assessment/Plan   1. CAD: Severe 3VD with decreased  EF.  I reviewed films with Dr. Ellyn Hack, PCI would be possible but would be difficult/high risk with multiple lesions and heavy calcification. 5/31 LIMA-LAD, SVG-RCA, SVG-OM, SVG-D. No s/s ischemic currently  - ASA 81 + atorvastatin 40 mg daily.  2. Acute/chronic HF with mid range EF => cardiogenic shock post-CABG: Suspect ischemic cardiomyopathy.  Echo 4/22 with EF 40-45% with moderately decreased RV systolic function and PASP 99, moderate TR. There was a prominent component of RV failure with RA pressure elevated out of proportion to PCWP (CVP/PCWP 0.875 on RHC) but PAPI adequate at 2.8. Post-op had PEA arrest then VT.  On 6/9, episode of altered mental status, progressive hypotension, and intubated.  Suspect septic shock, now improving.  Echo (6/9) with EF 45-50%, inferolateral severe hypokinesis, moderate RV dysfunction.  - NE increased 20-> 24 mcg overnight. Off VP co-ox has been normal - Suspect recurrent sepsis. Meropenum started 6/18 - CVVH ongoing. Keep ing even - Continue to wean pressors as able.  - Continue midodrine 3. ESRD: Suspected due to diabetes.  Volume up post-op with multiple blood products. Now back to baseline.  - CVVH ongoing, keeping even. Appreciate Nephrology input - Needs tunneled HD catheter upper chest but need to wait until infection clears - Renal following  4. Acute respiratory failure - s/p trach 6/16 - failed SBT over past 2 days due to sepsis - CCM managing  5.  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  The CO was not markedly high.  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 Oxygen saturation was 99% on RA pre-op, so no evidence for hepatopulmonary syndrome.   - Sildenafil has been on hold with pressors.  5. Cirrhosis: Noted on abdominal US from 2020.  H/o paracentesis.  Had workup at [facility] (though patient does not remember this), viral hepatitis labs negative.  They ended up think that the cirrhosis was cardiogenic.  Repeat abdominal US 5/23 w/ nodular hepatic parenchymal pattern with increased echogenicity consistent with the patient's known cirrhosis. No focal hepatic abnormality identified. Portal vein is patent. Moderate ascites. GI has seen, patient had 4L paracentesis on 5/26. - He may eventually need repeat paracentesis.  6. Coagulopathy: Post-op bleeding in setting of coagulopathy post-CABG in patient with cirrhosis.  Currently seems controlled.  Platelets dropped then recovered, doubt HIT.  7. VT: Had after PEA arrest am 6/1.  - Continue amiodarone 200 bid per tube.  8. Anemia: ABLA.  Hgb stable at 104 9. Pleural effusions: ?hepatic hydrothorax.    - Left effusion on last CXR, Too small to tap er CCM 10. PEA arrest: Respiratory arrest post-extubation.  ROSC with CPR.  11. Acute hypoxemic respiratory failure:  Extubated 6/2, re-intubated 6/9. Self-extubated overnight 6/12 and re-intubated. Extubated 6/15 then re-intubated. Now s/p tracheostomy.  12. Atrial arrhythmias: Atypical atrial flutter currently.   - Continue amiodarone per tube.  - On heparin gtt - Eventual DCCV.  13. ID:  Suspected HCAP with septic shock. Cultures with Staph epidermidis 1/2, suspect contaminant.  - Finished abx 6/16 - Recurrent sepsis physiology. Meropenum started 6/17.  - Suspect recurrent sepsis. D/w CCM. ID has been reconsulted. No note yet.  14. Neuro: Altered  mental status 6/9 with intubation.  CT head with no bleed or ischemic CVA seen.  EEG with diffuse encephalopathy.  6/15: sudden onset of unresponsiveness with no clear etiology. EEG unremarkable. Has been alert and following commands. Asymmetric pupils today but mental status much clearer. No  other deficits.  CCM d/w Neurology - continue supportive care  CRITICAL CARE Performed by: Glori Bickers  Total critical care time: 40 minutes  Critical care time was exclusive of separately billable procedures and treating other patients.  Critical care was necessary to treat or prevent imminent or life-threatening deterioration.  Critical care was time spent personally by me on the following activities: development of treatment plan with patient and/or surrogate as well as nursing, discussions with consultants, evaluation of patient's response to treatment, examination of patient, obtaining history from patient or surrogate, ordering and performing treatments and interventions, ordering and review of laboratory studies, ordering and review of radiographic studies, pulse oximetry and re-evaluation of patient's condition.  Glori Bickers MD 04/11/2021 10:02 AM

## 2021-04-11 NOTE — Progress Notes (Signed)
NAME:  David Dehner., MRN:  [id number], DOB:  [birth year], LOS: 87 ADMISSION DATE:  03/11/2021, CONSULTATION DATE:  04/18/2021 REFERRING MD:  TCTS CHIEF COMPLAINT: Code   History of Present Illness:  55 year old man with PMHx significant for HTN, T2DM, PAD, ESRD, cirrhosis (query cardiac etiology) who presented for symptomatic ischemic cardiomyopathy.    Patient underwent preoperative optimization with CHF, GI, and Nephrology teams with eventual CABG 5/31. On 6/1, patient developed respiratory distress followed by PEA arrest requiring CPR. Code was c/b increased bleeding around surgical sites, prompting mediastinal exploration 6/1. Intraoperative findings were notable for bleeding around mammary graft site and diffuse oozing for which patient received multiple blood products.   Patient intubated and on pressors postoperatively and PCCM was consulted for assistance with management.   Significant Hospital Events: Including procedures, antibiotic start and stop dates in addition to other pertinent events   5/23 Admitted 5/26 Paracentesis 4L 5/30 HD 5/31 CABG 5/31- 6/1 Code Blue, OR for mediastinal exploration 2/2 hemorrhagic shock induced by CPR. Factor 7, protamine, FFP, cryo given 6/1 PCCM consult, 4U PRBCs and 1U Plt . R pigtail chest tube placed due to limitation of vent weaning. 6/2 Extubated, chest tube 3.2L out past 24 hrs 6/3 Remains on 2L [place], chest tube with 500cc out/ 24 hrs 6/4 Remains on CRRT, 2L [place], milrinone, NE. 3 chest tubes remain. 6/6 Remains on CRRT, 4 chest tubes in place 6/9 Decreasing pressor requirements and CT output. CRRT transitioned to iHD. Echo repeated. Prior to HD session, patient suddenly became unresponsive with fixed and dilated pupils, minimal gag, breathing spontaneously. Code Stroke called. Reintubated, heparin d/c'ed, STAT CT Head/CTA. 6/10, WBC jump to 89 K, Temp of 102,Remains on 40 of Levo, 40 of Neo, and Vaso at 0.03.Pupils are 6 and non-responsive to  light. Trialysis cath inserted for CVVHD. R IJ is 4 days old, will get PICC and discontinue. Lactate of 2.8. Pigtail cath removed  6/12 coming down on pressors? Patient self extubated and reintubated 6/13 MRI negative for acute findings 6/15 Patient extubated; spoke with family and patient, if he fails extubation trial and requires reintubation they are okay with trach placement. 6/16 placed on cont sedation for trach. Trach completed. Started on LTM for episodes of being unresponsive.  IV cefepime stopped, completing course of antibiotics for pneumonia no organism specified 6/17 fent gtt stopped. Was actually fully awake even on fent gtt. Beginning ATC trials w/ plan for mandatory vent at night.  6/18   Interim History / Subjective:  Much better following re-initiation of antibiotics.  Agitation.   Objective   Blood pressure (!) 105/54, pulse (!) 105, temperature 98.2 F (36.8 C), resp. rate (!) 25, height '6\' 2"'  (1.88 m), weight 65 kg, SpO2 100 %. 4LNC    Vent Mode: PSV;CPAP FiO2 (%):  [40 %] 40 % Set Rate:  [20 bmp] 20 bmp Vt Set:  [570 mL] 570 mL PEEP:  [5 cmH20] 5 cmH20 Pressure Support:  [10 [id number] cmH20] 10 cmH20 Plateau Pressure:  [18 cmH20-20 cmH20] 20 cmH20   Intake/Output Summary (Last 24 hours) at 04/11/2021 1755 Last data filed at 04/11/2021 1700 Gross per 24 hour  Intake 3657.28 ml  Output 3284 ml  Net 373.28 ml    Filed Weights   04/09/21 0335 04/10/21 0500 04/11/21 0545  Weight: 71.4 kg 65.7 kg 65 kg   Physical Examination: General: Critically ill looking middle-aged Caucasian male, lying in the bed, s/p trach HEENT: Atraumatic, normocephalic temporal wasting, no JVD,  mucous membranes moist.  New tracheostomy unremarkable. Pulmonary: Bilateral rhonchi and crackles, no wheezes Cardiac: Regular rate and rhythm no murmur rub or gallop Abdomen soft not tender his tube feeds have been on hold will resume these today Extremities are warm dry pulses are palpable  does have some dependent edema.  His right AV fistula has a strong bruit and thrill Neuro awake, agitated, following intermittently.  Moves all extremities.  No focal deficits appreciated.  Labs/imaging that I have personally reviewed: (right click and "Reselect all SmartList Selections" daily)   See below   Resolved Hospital Problem list   Fever Post-op mediastinal hemorrhage Cirrhotic and hemorrhage- induced consumptive coagulopathy Hemorrhagic shock, resolved Acute blood loss anemia Thrombocytopenia- in setting of blood loss, coagulopathy, and cirrhosis Elevated Alk Phos/AST VT 6/1 Assessment & Plan:   CAD post CABG c/b  in hospital PEA cardiac arrest precipitated by hypoxia and blood loss Cardiac Cirrhosis with ascites Multifactorial shock Chronic Afib/flutter Acute metabolic encephalopathy Acute hypoxemic respiratory failure due to inability to protect airway Pulmonary edema and Bilateral pleural effusions (chest tubes removed.) and  LLL HAP vs pleural effusion Status post tracheostomy 6/16. ESRD on HD PTA Hyperkalemia improved DM2  Sacral pressure ulcer  Plan:  - resume trach collar trials - US abdomen to evaluate for source of infection - pathology smear review. - continue to wean inotropic support.  - appears euvolemic - run CRRT even  Best Practice (right click and "Reselect all SmartList Selections" daily)   Diet/type: tubefeeds Pain/Anxiety/Delirium protocol Yes and RASS goal: 0 VAP protocol (if indicated): Yes DVT prophylaxis: systemic heparin GI prophylaxis: PPI Glucose control:  SSI and Basal coverage Central venous access:  Yes, and it is still needed Arterial line:  N/A Foley:  N/A Mobility:  OOB  PT consulted: Yes Studies pending: None Code Status:  full code Last date of multidisciplinary goals of care discussion [per primary ] placing care management consult. Disposition will be a challenge  Serious Disposition: remains critically ill, will  stay in intensive care   CRITICAL CARE Performed by: Kipp Brood   Total critical care time: 40 minutes  Critical care time was exclusive of separately billable procedures and treating other patients.  Critical care was necessary to treat or prevent imminent or life-threatening deterioration.  Critical care was time spent personally by me on the following activities: development of treatment plan with patient and/or surrogate as well as nursing, discussions with consultants, evaluation of patient's response to treatment, examination of patient, obtaining history from patient or surrogate, ordering and performing treatments and interventions, ordering and review of laboratory studies, ordering and review of radiographic studies, pulse oximetry, re-evaluation of patient's condition and participation in multidisciplinary rounds.  Kipp Brood, MD [facility] ICU Physician [place]  Pager: [phone] Mobile: [phone] After hours: [phone].

## 2021-04-11 NOTE — Progress Notes (Addendum)
[facility] Progress Note   Assessment/ Plan:   Outpatient dialysis prescription: [facility] TTS [id number] 4 Hours, 180 dialyzer, 2K/2.5 Ca, DFR 500, BFR 400 Use AVF; graft on inner arm, 15 gauge Heparin loading dose 2000 and hourly dose of 1200 EDW 78.5 kg Last post weight 78 kg on 5/21 (went in at 78 and came out at 78 kg) Meds: epogen 2400 units each tx venofer 50 mg weekly Not on hectorol or calcitriol sensipar 30 mg three times a week with HD   Assessment/Plan:   Septic shock- on broad spectrum abx- Flagyl, cefepime.  Eraxis initially, stopped, meropenem restarted 6/18 d/t concerns over HCAP Acute hypoxic RF: intubated per PCCM--> failed extubation 6/15, now back intubated.  Trached 6/16. ESRD - normally on [place] dialysis in [place], last  outpatient HD 5/30 now on CRRT since 03/29/2021 following CABG complicated by PEA arrest and need for pressors.   - CRRT 04/20/2021-03/30/21 and temp HD catheter removed at that time.       - CRRT resumed 04/02/21 due to acute decompensation and profound hypotension, has L femoral temporary HD cath      - continue to keep even and even run + for a couple of hours to see if that improves pressor requirement  4.  Acute encephalopathy: periods of unresponsiveness, on cEEG without any epileptiform discharges, no AED recommended by neuro     - this AM 6/19--> pupils uneven- consider repeat CT head, d/w PCCM  5.  Abd pain- new 04/11/21- consider CT abd/ pelvis in the setting of increased leukocytosis and pressor requirement, on meropenem as in #1  CAD s/p CABG x 4 complicated by PEA arrest.  Acute on chronic CHF with cardiogenic shock post CABG -midodrine 15 TID  Repeat ECHO EF 45-50%, RV function moderately reduced. Pulmonary HTN - per HF team Cirrhosis - presumably cardiogenic.  S/p paracentesis on 03/18/21. 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  Hypophosphatemia - repleted with IV phos and  follow.  Has required multiple phos repletions- will see if Neutraphos is too gritty to get down the cortrak, appreciate pharmacy Afib/ flutter: on systemic heparin gtt and amiodarone Dispo: ICU  Subjective:    Seen in room. Started on meropenem back yesterday d/t not being able to tolerate PS trial.  L sided effusion but not big enough to drain.  This AM less responsive and pupils are uneven- minimally reactive on R and nonreactive on L on my exam.  Additionally has suprapubic and RLQ abd pain.     Objective:   BP (!) 101/50 (BP Location: Left Arm)   Pulse (!) 105   Temp 98 F (36.7 C)   Resp (!) 23   Ht '6\' 2"'  (1.88 m)   Wt 65 kg   SpO2 100%   BMI 18.40 kg/m   Physical Exam: Gen: ill appearing, cachectic, looks exhausted NECK: trach in place, c/d/i CVS: irregular, tachycardic Resp: coarse mechanical bilaterally Abd: soft, + Suprapubic and RLQ pain- flinches Ext: sarcopenic, dependent thigh edema improved ACCESS: L nontunneled femoral HD catheter, R UE AVF + T/B NEURO: grimaces to pain, R pupil 4 mm sluggish, L pupil 6 mm nonreactive  Labs: BMET Recent Labs  Lab 04/08/21 0427 04/08/21 1636 04/09/21 0029 04/09/21 1600 04/10/21 0037 04/10/21 1758 04/11/21 0111  NA 135 133* 134* 134* 136 135 135  K 5.5* 4.1 4.7 4.5 4.5 4.9 4.8  CL 100 98 100 100 102 100 102  CO2 '23 23 24 27 27 26 26  ' GLUCOSE 123* 238* 180* 223* 127* 194* 263*  BUN 19 20 21* 27* 30* 30* 29*  CREATININE 1.24 1.49* 1.37* 1.18 1.16 1.35* 1.35*  CALCIUM 8.4* 8.1* 8.2* 8.1* 8.4* 8.4* 8.3*  PHOS 1.7* 3.1 3.4 2.9 1.9* 1.6* 1.5*   CBC Recent Labs  Lab 04/07/21 1924 04/07/21 2002 04/08/21 0427 04/09/21 0029 04/10/21 0037 04/11/21 0111  WBC 29.5*  --  24.4* 26.8* 28.0* 29.0*  NEUTROABS 28.0*  --   --   --   --   --   HGB 11.7*   < > 10.5* 10.1* 9.7* 10.4*  HCT 36.8*   < > 32.2* 32.4* 31.4* 34.3*  MCV 97.6  --  94.2 98.5 100.6* 101.8*  PLT 215  --  185 237 208 266   < > = values in this interval not  displayed.      Medications:     amiodarone  200 mg Per Tube BID   aspirin  81 mg Per Tube Daily   atorvastatin  40 mg Per Tube Daily   bisacodyl  10 mg Oral Daily   Or   bisacodyl  10 mg Rectal Daily   chlorhexidine gluconate (MEDLINE KIT)  15 mL Mouth Rinse BID   Chlorhexidine Gluconate Cloth  6 each Topical Q0600   collagenase   Topical Daily   darbepoetin (ARANESP) injection - NON-DIALYSIS  60 mcg Subcutaneous Q Thu-1800   docusate  100 mg Per Tube BID   feeding supplement (PROSource TF)  45 mL Per Tube QID   insulin aspart  0-24 Units Subcutaneous Q4H   insulin glargine  10 Units Subcutaneous Daily   ipratropium-albuterol  3 mL Nebulization TID   lidocaine-EPINEPHrine  30 mL Intradermal Once   mouth rinse  15 mL Mouth Rinse 10 times per day   midodrine  30 mg Per Tube TID WC   multivitamin  1 tablet Per Tube QHS   nutrition supplement (JUVEN)  1 packet Per Tube BID BM   pantoprazole sodium  40 mg Per Tube Daily   polyethylene glycol  17 g Per Tube Daily   QUEtiapine  25 mg Oral QHS   sodium chloride flush  10-40 mL Intracatheter Q12H   sodium chloride flush  3 mL Intravenous Q12H   Thrombi-Pad  1 each Topical Once     Madelon Lips, MD 04/11/2021, 8:52 AM

## 2021-04-11 NOTE — Progress Notes (Signed)
RT NOTE: patient placed on CPAP/PSV of 10/5 at 0800.  Currently tolerating well.  Will continue to wean as able.  Will continue to monitor.

## 2021-04-11 NOTE — Procedures (Addendum)
Patient Name: David Rose.  MRN: [id number]  Epilepsy Attending: Lora Havens  Referring Physician/Provider: Dr Kerney Elbe Duration: 04/10/2021 [id number] to 04/11/2021 0934   Patient history: 55 yo M with sudden onset of unresponsiveness with no clear etiology. EEG to evaluate for status epilepticus.    Level of alertness:  awake, asleep   AEDs during EEG study: None   Technical aspects: This EEG study was done with scalp electrodes positioned according to the 10-20 International system of electrode placement. Electrical activity was acquired at a sampling rate of '500Hz'$  and reviewed with a high frequency filter of '70Hz'$  and a low frequency filter of '1Hz'$ . EEG data were recorded continuously and digitally stored.    Description: EEG showed posterior dominant rhythm of 9-10 Hz activity of moderate voltage (25-35 uV) seen predominantly in posterior head regions, symmetric and reactive to eye opening and eye closing. Sleep was characterized by sleep spindles (12-'14hz'$ ) maximal frontocentral region. Intermittent generalized 3 to 6 Hz theta- delta slowing was also noted.  Hyperventilation and photic stimulation were not performed.      IMPRESSION: This study is suggestive of  mild diffuse encephalopathy. No seizures or epileptiform discharges were seen throughout the recording.  EEG appears similar to previous day.   David Rose

## 2021-04-11 NOTE — Progress Notes (Signed)
LTM EEG discontinued - no skin breakdown at unhook.   

## 2021-04-11 NOTE — Procedures (Addendum)
Patient Name: Kaiyden Aftab.  MRN: [id number]  Epilepsy Attending: Lora Havens  Referring Physician/Provider: Dr Kerney Elbe Duration: 04/11/2021 0934 to 04/12/2021 1506   Patient history: 55 yo M with sudden onset of unresponsiveness with no clear etiology. EEG to evaluate for status epilepticus.    Level of alertness:  awake, asleep   AEDs during EEG study: None   Technical aspects: This EEG study was done with scalp electrodes positioned according to the 10-20 International system of electrode placement. Electrical activity was acquired at a sampling rate of '500Hz'$  and reviewed with a high frequency filter of '70Hz'$  and a low frequency filter of '1Hz'$ . EEG data were recorded continuously and digitally stored.    Description: EEG showed posterior dominant rhythm of 9-10 Hz activity of moderate voltage (25-35 uV) seen predominantly in posterior head regions, symmetric and reactive to eye opening and eye closing. Sleep was characterized by sleep spindles (12-'14hz'$ ) maximal frontocentral region. Intermittent generalized 3 to 6 Hz theta- delta slowing was also noted.  Hyperventilation and photic stimulation were not performed.      IMPRESSION: This study is suggestive of  mild diffuse encephalopathy. No seizures or epileptiform discharges were seen throughout the recording.   Zaydon Kinser Barbra Sarks

## 2021-04-11 NOTE — Progress Notes (Signed)
      [address]       [place] [address]             [phone]        CARDIOTHORACIC SURGERY PROGRESS NOTE   R18 Days Post-Op Procedure(s) (LRB): EXPLORATION POST OPERATIVE OPEN HEART (N/A)  Subjective: No events although started on empiric Meropenum yesterday.  Less agitated today  Objective: Vital signs: BP Readings from Last 1 Encounters:  04/11/21 (!) 93/51   Pulse Readings from Last 1 Encounters:  04/11/21 (!) 105   Resp Readings from Last 1 Encounters:  04/11/21 (!) 23   Temp Readings from Last 1 Encounters:  04/11/21 98 F (36.7 C)    Hemodynamics:    Physical Exam:  Rhythm:   Sinus tach  Breath sounds: Coarse, symmetrical  Heart sounds:  RRR  Incisions:  Clean and dry  Abdomen:  Soft, non-distended, tolerating tube feeds  Extremities:  Warm although cool in periphery, adequately-perfused, looks dry   Intake/Output from previous day: 06/18 0701 - 06/19 0700 In: 3339.5 [I.V.:1379.8; NG/GT:1680; IV Piggyback:279.7] Out: 3164 [Stool:50] Intake/Output this shift: Total I/O In: 425 [I.V.:159.9; NG/GT:245; IV Piggyback:20.1] Out: 311 [Other:311]  Lab Results:  CBC: Recent Labs    04/10/21 0037 04/11/21 0111  WBC 28.0* 29.0*  HGB 9.7* 10.4*  HCT 31.4* 34.3*  PLT 208 266    BMET:  Recent Labs    04/10/21 1758 04/11/21 0111  NA 135 135  K 4.9 4.8  CL 100 102  CO2 26 26  GLUCOSE 194* 263*  BUN 30* 29*  CREATININE 1.35* 1.35*  CALCIUM 8.4* 8.3*     PT/INR:  No results for input(s): LABPROT, INR in the last 72 hours.  CBG (last 3)  Recent Labs    04/10/21 2352 04/11/21 0401 04/11/21 0713  GLUCAP 184* 192* 216*    ABG    Component Value Date/Time   PHART 7.406 04/07/2021 2002   PCO2ART 42.6 04/07/2021 2002   PO2ART 326 (H) 04/07/2021 2002   HCO3 26.9 04/07/2021 2002   TCO2 28 04/07/2021 2002   ACIDBASEDEF 10.0 (H) 04/02/2021 0352   O2SAT 100.0 04/07/2021 2002    CXR: N/A  Assessment/Plan: S/P  Procedure(s) (LRB): EXPLORATION POST OPERATIVE OPEN HEART (N/A)  Slow progress Stable hemodynamics although still on levophed for BP support Remains VDRF with little progress in vent weaning Tolerating tube feeds Tolerating CRRT - looks relatively intravascularly dry and weight down, now keeping volume status even WBC slowly climbing, no fevers - now on meropenum for empiric coverage ? source   Rexene Alberts, MD 04/11/2021 10:42 AM

## 2021-04-11 NOTE — Progress Notes (Signed)
ANTICOAGULATION CONSULT NOTE  Pharmacy Consult for heparin Indication: atrial flutter  Allergies  Allergen Reactions   Shellfish-Derived Products Anaphylaxis   Wasp Venom Protein Anaphylaxis   Penicillins Other (See Comments)    Childhood reaction    Patient Measurements: Height: '6\' 2"'$  (188 cm) Weight: 65 kg (143 lb 4.8 oz) IBW/kg (Calculated) : 82.2 Heparin Dosing Weight: 79kg  Vital Signs: Temp: 98 F (36.7 C) (06/19 0700) Temp Source: Axillary (06/19 0430) BP: 111/51 (06/19 0900) Pulse Rate: 105 (06/19 0900)  Labs: Recent Labs    04/09/21 0029 04/09/21 1600 04/10/21 0037 04/10/21 1758 04/11/21 0111  HGB 10.1*  --  9.7*  --  10.4*  HCT 32.4*  --  31.4*  --  34.3*  PLT 237  --  208  --  266  HEPARINUNFRC 0.44  --  0.51  --  0.39  CREATININE 1.37*   < > 1.16 1.35* 1.35*   < > = values in this interval not displayed.     Estimated Creatinine Clearance: 56.8 mL/min (A) (by C-G formula based on SCr of 1.35 mg/dL (H)).   Medical History: Past Medical History:  Diagnosis Date   ESRD (end stage renal disease) ([place])    Hypertension    Peripheral arterial disease ([place])    Type 2 diabetes mellitus ([place])    Assessment: 53 YOF with recent CABG now with postop afib/flutter. HIT negative per labs.   Heparin level therapeutic, CBC stable.  Goal of Therapy:  Heparin level 0.3-0.5  units/ml  Monitor platelets by anticoagulation protocol: Yes   Plan:  -Continue heparin 2750 units/hr -Monitor daily HL, CBC, and for s/sx of bleeding    Arrie Senate, PharmD, BCPS, [facility] Clinical Pharmacist [phone] Please check AMION for all [facility] Pharmacy numbers 04/11/2021

## 2021-04-11 NOTE — Progress Notes (Signed)
Neurology Progress Note   Brief HPI: 55 y.o. male with PMHx of HTN, DM2, ESRD on HD, chronic systolic heart failure, cirrhosis, CAD, cardiac arrest s/p CABG. He had 2 inpatient episodes of sudden unresponsiveness requiring intubation and subsequent tracheostomy placement.    Subjective: ICU providers with concern for anisocoria on examination this morning- discussed with providers the chronic nature of his pupillary examination.  72 hours of LTM EEG complete today with ongoing mild diffuse encephalopathy but without seizures or epileptiform discharges since EEG initiation.  Patient with improved neurologic examination this morning compared to yesterday, though per bedside RN his neurologic status and agitation / participation waxes and wanes throughout the day   Exam: Vitals:   04/11/21 0900 04/11/21 1000  BP: (!) 111/51 (!) 93/51  Pulse: (!) 105 (!) 105  Resp: (!) 25 (!) 23  Temp:    SpO2: 100% 100%   Gen: Laying in bed with some restless upper extremity movements initially, awake, in no acute distress Resp: tracheostomy in place midline neck, on mechanical ventilator with SpO2 at 100% on cardiac monitor, spontaneous respirations over ventilator rate Abd: soft, non-distended, non-tender to palpation  Neuro: MS: Awake, alert, with some agitated movements of bilateral upper extremities.  Patient intermittently follows commands with each extremity and is able to show 2 fingers and give a "thumbs up" on each hand after repeated instruction. He is able to lift bilateral lower extremities against gravity to command. He occasionally nods / shakes "yes" and "no" to examiner questions though inconsistently   CN: Pupils 7 mm, equal and round. Left eye with opaque cornea and no reaction to light. Right eye 7 mm with sluggish reaction to light, EOMI, does not blink to threat in any visual fields when tested on the left eye, face appears symmetric resting and grimacing, hearing is intact to voice, head  is midline.  Motor: Moves each extremity restlessly without asymmetry noted. Able to lift bilateral lower extremities briefly against gravity. Poor attention noted.  Tone is normal / increased with agitated movements. Bulk is decreased.  Sensory: Unable to assess sensation to light touch throughout but patient increases agitated movements with manipulation of each extremity.  Gait: Deferred  Pertinent Labs: CBC    Component Value Date/Time   WBC 29.0 (H) 04/11/2021 0111   RBC 3.37 (L) 04/11/2021 0111   HGB 10.4 (L) 04/11/2021 0111   HCT 34.3 (L) 04/11/2021 0111   PLT 266 04/11/2021 0111   MCV 101.8 (H) 04/11/2021 0111   MCH 30.9 04/11/2021 0111   MCHC 30.3 04/11/2021 0111   RDW 24.0 (H) 04/11/2021 0111   LYMPHSABS 0.6 (L) 04/07/2021 1924   MONOABS 0.9 04/07/2021 1924   EOSABS 0.0 04/07/2021 1924   BASOSABS 0.0 04/07/2021 1924   CMP     Component Value Date/Time   NA 135 04/11/2021 0111   K 4.8 04/11/2021 0111   CL 102 04/11/2021 0111   CO2 26 04/11/2021 0111   GLUCOSE 263 (H) 04/11/2021 0111   BUN 29 (H) 04/11/2021 0111   CREATININE 1.35 (H) 04/11/2021 0111   CALCIUM 8.3 (L) 04/11/2021 0111   PROT 7.0 04/07/2021 1924   ALBUMIN 2.0 (L) 04/11/2021 0111   AST 41 04/07/2021 1924   ALT 21 04/07/2021 1924   ALKPHOS 202 (H) 04/07/2021 1924   BILITOT 2.1 (H) 04/07/2021 1924   GFRNONAA >60 04/11/2021 0111   Lab Results  Component Value Date   CHOL 108 03/22/2021   HDL 32 (L) 03/12/2021   LDLCALC  66 02/27/2021   TRIG 54 03/28/2021   CHOLHDL 3.4 03/01/2021   Lab Results  Component Value Date   HGBA1C 6.5 (H) 03/14/2021   Imaging Reviewed:   MRI brain without contrast 04/06/2021: 1. No acute intracranial abnormality. 2. 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 3. Bilateral mastoid effusions and fluid in the nasopharynx.   Overnight EEG 04/09/2021 - 04/10/2021: "This study was initially suggestive of moderate diffuse encephalopathy,  nonspecific etiology which gradually improved to mild diffuse encephalopathy. No seizures or epileptiform discharges were seen throughout the recording."  Overnight EEG 04/10/2021 - 04/11/2021: "This study is suggestive of  mild diffuse encephalopathy. No seizures or epileptiform discharges were seen throughout the recording. EEG appears similar to previous day."  Assessment: 55 year old male with h/o hypertension, diabetes, end-stage renal disease, chronic systolic heart failure, cirrhosis, coronary artery disease, cardiac arrest status post CABG who has had at least 2 episodes of sudden unresponsiveness on 04/01/2021, 04/07/2021.  - Has been on LTM EEG for 72 hours, with findings suggestive of mild to moderate diffuse encephalopathy, nonspecific etiology for first 24 hours, then gradual improvement to a mild diffuse encephalopathy on second 24 hours. At 72 hours completion of LTM EEG, tracings were similar to the previous day without seizures or epileptiform discharges and ongoing mild diffuse encephalopathy. - Semiology of episodes, h/o significant cardiac comorbidities, no ictal-interictal eeg abnormality so far. Therefore, more likely these episodes are cardiogenic.  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   Recommendations: - Discontinue LTM  - No further neurologic work up recommended at this time, neurology will be available on an as-needed basis for further questions or concerns  Anibal Henderson, AGACNP-BC [facility] [phone]  Electronically signed: Dr. Kerney Elbe

## 2021-04-11 NOTE — Plan of Care (Signed)
  Problem: Education: Goal: Knowledge of General Education information will improve Description: Including pain rating scale, medication(s)/side effects and non-pharmacologic comfort measures Outcome: Not Progressing   Problem: Health Behavior/Discharge Planning: Goal: Ability to manage health-related needs will improve Outcome: Not Progressing   Problem: Clinical Measurements: Goal: Ability to maintain clinical measurements within normal limits will improve Outcome: Progressing Goal: Will remain free from infection Outcome: Not Progressing Goal: Diagnostic test results will improve Outcome: Not Progressing Goal: Respiratory complications will improve Outcome: Not Progressing Goal: Cardiovascular complication will be avoided Outcome: Progressing   Problem: Activity: Goal: Risk for activity intolerance will decrease Outcome: Not Progressing   Problem: Nutrition: Goal: Adequate nutrition will be maintained Outcome: Progressing   Problem: Coping: Goal: Level of anxiety will decrease Outcome: Progressing   Problem: Elimination: Goal: Will not experience complications related to bowel motility Outcome: Progressing Goal: Will not experience complications related to urinary retention Outcome: Not Applicable   Problem: Pain Managment: Goal: General experience of comfort will improve Outcome: Progressing   Problem: Safety: Goal: Ability to remain free from injury will improve Outcome: Progressing   Problem: Skin Integrity: Goal: Risk for impaired skin integrity will decrease Outcome: Progressing   Problem: Education: Goal: Will demonstrate proper wound care and an understanding of methods to prevent future damage Outcome: Not Progressing Goal: Knowledge of disease or condition will improve Outcome: Not Progressing Goal: Knowledge of the prescribed therapeutic regimen will improve Outcome: Not Progressing Goal: Individualized Educational Video(s) Outcome: Not  Progressing   Problem: Activity: Goal: Risk for activity intolerance will decrease Outcome: Not Progressing   Problem: Cardiac: Goal: Will achieve and/or maintain hemodynamic stability Outcome: Not Progressing   Problem: Clinical Measurements: Goal: Postoperative complications will be avoided or minimized Outcome: Not Progressing   Problem: Respiratory: Goal: Respiratory status will improve Outcome: Progressing   Problem: Skin Integrity: Goal: Wound healing without signs and symptoms of infection Outcome: Progressing Goal: Risk for impaired skin integrity will decrease Outcome: Progressing   Problem: Urinary Elimination: Goal: Ability to achieve and maintain adequate renal perfusion and functioning will improve Outcome: Not Progressing   Problem: Safety: Goal: Non-violent Restraint(s) Outcome: Not Progressing

## 2021-04-12 ENCOUNTER — Inpatient Hospital Stay (HOSPITAL_COMMUNITY): Payer: Medicare Other

## 2021-04-12 DIAGNOSIS — Z7189 Other specified counseling: Secondary | ICD-10-CM | POA: Diagnosis not present

## 2021-04-12 DIAGNOSIS — Z515 Encounter for palliative care: Secondary | ICD-10-CM | POA: Diagnosis not present

## 2021-04-12 DIAGNOSIS — I5023 Acute on chronic systolic (congestive) heart failure: Secondary | ICD-10-CM | POA: Diagnosis not present

## 2021-04-12 DIAGNOSIS — Z951 Presence of aortocoronary bypass graft: Secondary | ICD-10-CM | POA: Diagnosis not present

## 2021-04-12 DIAGNOSIS — Z978 Presence of other specified devices: Secondary | ICD-10-CM

## 2021-04-12 DIAGNOSIS — N186 End stage renal disease: Secondary | ICD-10-CM | POA: Diagnosis not present

## 2021-04-12 DIAGNOSIS — J9601 Acute respiratory failure with hypoxia: Secondary | ICD-10-CM | POA: Diagnosis not present

## 2021-04-12 DIAGNOSIS — R570 Cardiogenic shock: Secondary | ICD-10-CM | POA: Diagnosis not present

## 2021-04-12 LAB — GLUCOSE, CAPILLARY
Glucose-Capillary: 105 mg/dL — ABNORMAL HIGH (ref 70–99)
Glucose-Capillary: 129 mg/dL — ABNORMAL HIGH (ref 70–99)
Glucose-Capillary: 134 mg/dL — ABNORMAL HIGH (ref 70–99)
Glucose-Capillary: 142 mg/dL — ABNORMAL HIGH (ref 70–99)
Glucose-Capillary: 228 mg/dL — ABNORMAL HIGH (ref 70–99)
Glucose-Capillary: 252 mg/dL — ABNORMAL HIGH (ref 70–99)
Glucose-Capillary: 255 mg/dL — ABNORMAL HIGH (ref 70–99)
Glucose-Capillary: 29 mg/dL — CL (ref 70–99)

## 2021-04-12 LAB — RENAL FUNCTION PANEL
Albumin: 2.2 g/dL — ABNORMAL LOW (ref 3.5–5.0)
Albumin: 2 g/dL — ABNORMAL LOW (ref 3.5–5.0)
Albumin: 1.9 g/dL — ABNORMAL LOW (ref 3.5–5.0)
Anion gap: 11 (ref 5–15)
Anion gap: 6 (ref 5–15)
Anion gap: 7 (ref 5–15)
BUN: 34 mg/dL — ABNORMAL HIGH (ref 6–20)
BUN: 32 mg/dL — ABNORMAL HIGH (ref 6–20)
BUN: 30 mg/dL — ABNORMAL HIGH (ref 6–20)
CO2: 19 mmol/L — ABNORMAL LOW (ref 22–32)
CO2: 28 mmol/L (ref 22–32)
CO2: 24 mmol/L (ref 22–32)
Calcium: 8.2 mg/dL — ABNORMAL LOW (ref 8.9–10.3)
Calcium: 8.6 mg/dL — ABNORMAL LOW (ref 8.9–10.3)
Calcium: 8.3 mg/dL — ABNORMAL LOW (ref 8.9–10.3)
Chloride: 104 mmol/L (ref 98–111)
Chloride: 103 mmol/L (ref 98–111)
Chloride: 103 mmol/L (ref 98–111)
Creatinine, Ser: 1.29 mg/dL — ABNORMAL HIGH (ref 0.61–1.24)
Creatinine, Ser: 1.27 mg/dL — ABNORMAL HIGH (ref 0.61–1.24)
Creatinine, Ser: 1.11 mg/dL (ref 0.61–1.24)
GFR, Estimated: >60 mL/min (ref >60)
GFR, Estimated: >60 mL/min (ref >60)
GFR, Estimated: >60 mL/min (ref >60)
Glucose, Bld: 210 mg/dL — ABNORMAL HIGH (ref 70–99)
Glucose, Bld: 76 mg/dL (ref 70–99)
Glucose, Bld: 144 mg/dL — ABNORMAL HIGH (ref 70–99)
Phosphorus: 2.2 mg/dL — ABNORMAL LOW (ref 2.5–4.6)
Phosphorus: 1.6 mg/dL — ABNORMAL LOW (ref 2.5–4.6)
Phosphorus: 3.6 mg/dL (ref 2.5–4.6)
Potassium: 6.2 mmol/L — ABNORMAL HIGH (ref 3.5–5.1)
Potassium: 5 mmol/L (ref 3.5–5.1)
Potassium: 4.5 mmol/L (ref 3.5–5.1)
Sodium: 134 mmol/L — ABNORMAL LOW (ref 135–145)
Sodium: 137 mmol/L (ref 135–145)
Sodium: 134 mmol/L — ABNORMAL LOW (ref 135–145)

## 2021-04-12 LAB — CBC
HCT: 36.8 % — ABNORMAL LOW (ref 39.0–52.0)
HCT: 33.4 % — ABNORMAL LOW (ref 39.0–52.0)
Hemoglobin: 11.2 g/dL — ABNORMAL LOW (ref 13.0–17.0)
Hemoglobin: 10 g/dL — ABNORMAL LOW (ref 13.0–17.0)
MCH: 31.6 pg (ref 26.0–34.0)
MCH: 31.3 pg (ref 26.0–34.0)
MCHC: 30.4 g/dL (ref 30.0–36.0)
MCHC: 29.9 g/dL — ABNORMAL LOW (ref 30.0–36.0)
MCV: 104 fL — ABNORMAL HIGH (ref 80.0–100.0)
MCV: 104.4 fL — ABNORMAL HIGH (ref 80.0–100.0)
Platelets: 202 10*3/uL (ref 150–400)
Platelets: 274 10*3/uL (ref 150–400)
RBC: 3.54 MIL/uL — ABNORMAL LOW (ref 4.22–5.81)
RBC: 3.2 MIL/uL — ABNORMAL LOW (ref 4.22–5.81)
RDW: 24 % — ABNORMAL HIGH (ref 11.5–15.5)
RDW: 23.8 % — ABNORMAL HIGH (ref 11.5–15.5)
WBC: 24.8 10*3/uL — ABNORMAL HIGH (ref 4.0–10.5)
WBC: 25.6 10*3/uL — ABNORMAL HIGH (ref 4.0–10.5)
nRBC: 0.8 % — ABNORMAL HIGH (ref 0.0–0.2)
nRBC: 0.9 % — ABNORMAL HIGH (ref 0.0–0.2)

## 2021-04-12 LAB — MAGNESIUM
Magnesium: 2.4 mg/dL (ref 1.7–2.4)
Magnesium: 2.4 mg/dL (ref 1.7–2.4)

## 2021-04-12 LAB — AMMONIA: Ammonia: 43 umol/L — ABNORMAL HIGH (ref 9–35)

## 2021-04-12 LAB — CORTISOL: Cortisol, Plasma: 18.3 ug/dL

## 2021-04-12 LAB — POTASSIUM: Potassium: 4.9 mmol/L (ref 3.5–5.1)

## 2021-04-12 LAB — HEPARIN LEVEL (UNFRACTIONATED)
Heparin Unfractionated: 0.29 IU/mL — ABNORMAL LOW (ref 0.30–0.70)
Heparin Unfractionated: 0.42 IU/mL (ref 0.30–0.70)

## 2021-04-12 LAB — PATHOLOGIST SMEAR REVIEW

## 2021-04-12 MED ORDER — HYDROCORTISONE 5 MG PO TABS
5.0000 mg | ORAL_TABLET | Freq: Every day | ORAL | Status: DC
  Administered 2021-04-13: 5 mg via ORAL
  Filled 2021-04-12 (×2): qty 1

## 2021-04-12 MED ORDER — AMIODARONE HCL IN DEXTROSE 360-4.14 MG/200ML-% IV SOLN
30.0000 mg/h | INTRAVENOUS | Status: DC
  Administered 2021-04-13 – 2021-04-14 (×4): 30 mg/h via INTRAVENOUS
  Filled 2021-04-12 (×4): qty 200

## 2021-04-12 MED ORDER — AMIODARONE HCL IN DEXTROSE 360-4.14 MG/200ML-% IV SOLN: 30.0000 mg/h | INTRAVENOUS | Status: DC

## 2021-04-12 MED ORDER — MIDAZOLAM HCL 2 MG/2ML IJ SOLN
INTRAMUSCULAR | Status: AC
  Administered 2021-04-12: 2 mg via INTRAVENOUS
  Filled 2021-04-12: qty 2

## 2021-04-12 MED ORDER — METOCLOPRAMIDE HCL 5 MG/ML IJ SOLN
20.0000 mg | Freq: Once | INTRAVENOUS | Status: AC
  Administered 2021-04-12: 20 mg via INTRAVENOUS
  Filled 2021-04-12: qty 4

## 2021-04-12 MED ORDER — DEXTROSE-NACL 5-0.9 % IV SOLN: INTRAVENOUS | Status: DC

## 2021-04-12 MED ORDER — SODIUM PHOSPHATES 45 MMOLE/15ML IV SOLN
30.0000 mmol | Freq: Once | INTRAVENOUS | Status: AC
  Administered 2021-04-12: 30 mmol via INTRAVENOUS
  Filled 2021-04-12: qty 10

## 2021-04-12 MED ORDER — QUETIAPINE FUMARATE 50 MG PO TABS
50.0000 mg | ORAL_TABLET | Freq: Two times a day (BID) | ORAL | Status: DC
  Administered 2021-04-13: 50 mg
  Filled 2021-04-12: qty 1

## 2021-04-12 MED ORDER — CLONAZEPAM 0.5 MG PO TABS
0.5000 mg | ORAL_TABLET | Freq: Two times a day (BID) | ORAL | Status: DC
  Administered 2021-04-13 – 2021-04-14 (×4): 0.5 mg
  Filled 2021-04-12 (×4): qty 1

## 2021-04-12 MED ORDER — PANTOPRAZOLE SODIUM 40 MG PO PACK
40.0000 mg | PACK | Freq: Two times a day (BID) | ORAL | Status: DC
  Administered 2021-04-13 – 2021-04-14 (×4): 40 mg
  Filled 2021-04-12 (×4): qty 20

## 2021-04-12 MED ORDER — DEXTROSE 50 % IV SOLN
INTRAVENOUS | Status: AC
  Administered 2021-04-12: 50 mL
  Filled 2021-04-12: qty 50

## 2021-04-12 MED ORDER — POLYVINYL ALCOHOL 1.4 % OP SOLN
1.0000 [drp] | OPHTHALMIC | Status: DC | PRN
  Administered 2021-04-13: 1 [drp] via OPHTHALMIC
  Filled 2021-04-12: qty 15

## 2021-04-12 MED ORDER — DEXTROSE 50 % IV SOLN: 25.0000 g | Freq: Once | INTRAVENOUS | Status: AC

## 2021-04-12 MED ORDER — MIDAZOLAM HCL 2 MG/2ML IJ SOLN: 2.0000 mg | Freq: Once | INTRAMUSCULAR | Status: AC

## 2021-04-12 NOTE — Progress Notes (Signed)
Cortrak Tube Team Note:  Consult received to advance Cortrak tube beyond the pylorus. Multiple attempts made at advancing tube without success. Tube bridled at 70cm in R nare. Recommend IR advance tube further. Discussed with RN.  [place] is required, abdominal x-ray has been ordered by the Cortrak team. Please confirm tube placement before using the Cortrak tube.   If the tube becomes dislodged please keep the tube and contact the Cortrak team at [email or web address] (password [email or web address]) for replacement.  If after hours and replacement cannot be delayed, place a NG tube and confirm placement with an abdominal x-ray.    Larkin Ina, MS, RD, LDN Pronouns: She/Her/Hers RD pager number and weekend/on-call pager number located in [place].

## 2021-04-12 NOTE — Progress Notes (Addendum)
Patient ID: David Hoopes., male   DOB: 12-11-1965, 55 y.o.   MRN: [id number]      Advanced Heart Failure Rounding Note  PCP-Cardiologist: Carlyle Dolly, MD    Patient Profile   55 y/o male w/  history of ESRD due to DM and CHF with mid-range EF (LV EF 40-45% with moderately decreased RV systolic function and PASP 99 on 4/22 echo) as well as cirrhosis of uncertain etiology.  Based on low EF and elevated PA pressure, right and left heart cath was done in 5/22.  This showed severe 3VD, high cardiac output with moderate pulmonary hypertension but low PVR.  He is planned for CABG, but admitted pre-op for optimization.    Subjective:    Events  - 5/31 CABG x 4 with LIMA-LAD, SVG-RCA, SVG-OM, SVG-D.  - 5/31  Coagulopathic post-op with multiple blood products.  Extubated post-op but developed respiratory distress => PEA arrest then VT. Reintubated.   - 6/1 Returned to OR for mediastinal exploration - 6/2  Extubated - 6/9 AMS--> CT negative for acute bleed or ischemic CVA. Intubated. Suspected septic shock. CVVH restarted. - 6/15: Extubated but later in the day had sudden onset of unresponsiveness re-intubated. EEG negative. NH3 18.  - 6/16 trach  - 6/16 abx stopped Finished Flagyl/Eraxis/cefepime for HCAP/septic shock.  - 6/18 meropenum started - 6/19 EEG - mild diffuse encephalopathy. No seizures.   Febrile. Remains on Meropenum. CXR LLL opacity, unchanged.   Remains on CVVHD.Running even.   Remains on norepi 24>18     Agitated over night. Restrained . ? Aspiration. Tube feeds stopped due to possible tube feeds around trach.   Echo (6/9) with EF 45-50%, inferolateral severe hypokinesis, moderate RV dysfunction.    Objective:   Weight Range: 61.4 kg Body mass index is 17.38 kg/m.   Vital Signs:   Temp:  [97.9 F (36.6 C)-101.2 F (38.4 C)] 98.6 F (37 C) (06/20 0430) Pulse Rate:  [96-107] 97 (06/20 0800) Resp:  [15-31] 20 (06/20 0800) BP: (73-139)/(44-119) 113/60  (06/20 0800) SpO2:  [94 %-100 %] 100 % (06/20 0800) FiO2 (%):  [40 %] 40 % (06/20 0320) Weight:  [61.4 kg] 61.4 kg (06/20 0500) Last BM Date: 04/11/21  Weight change: Filed Weights   04/10/21 0500 04/11/21 0545 04/12/21 0500  Weight: 65.7 kg 65 kg 61.4 kg    Intake/Output:   Intake/Output Summary (Last 24 hours) at 04/12/2021 0816 Last data filed at 04/12/2021 0800 Gross per 24 hour  Intake 3323.39 ml  Output 3283 ml  Net 40.39 ml      Physical Exam  General: On vent via trach. Cachetic HEENT: L pupil non reactive  Neck: Trach ? Tube feeds around trach.  Supple. JVP difficult to assess. Carotids 2+ bilat; no bruits. No lymphadenopathy or thryomegaly appreciated. Cor: PMI nondisplaced. Tachy regular rate & rhythm. No rubs, gallops or murmurs. Lungs: Coarse throughout Decreased left.  Abdomen: soft, nontender, nondistended. No hepatosplenomegaly. No bruits or masses. Good bowel sounds. Extremities: no cyanosis, clubbing, rash, edema. R groin HD cath. RUE AVF Neuro: Eyes close not following commands.  Telemetry   A flutter 90-100s   Labs    CBC Recent Labs    04/12/21 0055 04/12/21 0407  WBC 24.8* 25.6*  HGB 11.2* 10.0*  HCT 36.8* 33.4*  MCV 104.0* 104.4*  PLT 202 414   Basic Metabolic Panel Recent Labs    04/12/21 0055 04/12/21 0407  NA 134* 137  K 6.2* 4.9  5.0  CL 104 103  CO2 19* 28  GLUCOSE 210* 76  BUN 34* 32*  CREATININE 1.29* 1.27*  CALCIUM 8.2* 8.6*  MG 2.4 2.4  PHOS 2.2* 1.6*   Liver Function Tests Recent Labs    04/12/21 0055 04/12/21 0407  ALBUMIN 2.2* 2.0*   No results for input(s): LIPASE, AMYLASE in the last 72 hours. Cardiac Enzymes No results for input(s): CKTOTAL, CKMB, CKMBINDEX, TROPONINI in the last 72 hours.  BNP: BNP (last 3 results) Recent Labs    03/20/2021 1545  BNP 1,127.8*    ProBNP (last 3 results) No results for input(s): PROBNP in the last 8760 hours.   D-Dimer No results for input(s): DDIMER in the last  72 hours.  Hemoglobin A1C No results for input(s): HGBA1C in the last 72 hours. Fasting Lipid Panel No results for input(s): CHOL, HDL, LDLCALC, TRIG, CHOLHDL, LDLDIRECT in the last 72 hours. Thyroid Function Tests No results for input(s): TSH, T4TOTAL, T3FREE, THYROIDAB in the last 72 hours.  Invalid input(s): FREET3   Other results:   Imaging    US Abdomen Complete  Result Date: 04/11/2021 CLINICAL DATA:  Abdominal pain EXAM: ABDOMEN ULTRASOUND COMPLETE COMPARISON:  Ultrasound abdomen 04/03/2021. CT abdomen pelvis 04/02/2021 FINDINGS: Gallbladder: Gallbladder sludge. 8 mm gallstone. Negative sonographic Murphy sign. Gallbladder wall mildly thickened 4.5 mm. Negative for pericholecystic fluid. Common bile duct: Diameter: 4.0 mm Liver: Cirrhosis of the liver. Coarse echotexture without focal liver lesion. Portal vein is patent on color Doppler imaging with normal direction of blood flow towards the liver. IVC: No abnormality visualized. Pancreas: Visualized portion unremarkable. Spleen: Size and appearance within normal limits. Right Kidney: Length: 10.0 cm. Echogenicity within normal limits. No mass or hydronephrosis visualized. Left Kidney: Not visualized due to bowel gas. Abdominal aorta: Atherosclerotic aorta without aneurysm. Other findings: Limited study due to inability of the patient to hold breath and hold still. Small ascites.  Right pleural effusion noted. IMPRESSION: Gallbladder sludge with gallstone. Mild gallbladder wall thickening. Negative sonographic Murphy sign. Cirrhosis liver with mild ascites Right pleural effusion Electronically Signed   By: Franchot Gallo M.D.   On: 04/11/2021 16:17   DG Chest Port 1 View  Result Date: 04/12/2021 CLINICAL DATA:  Pneumonia EXAM: PORTABLE CHEST 1 VIEW COMPARISON:  04/10/2021 FINDINGS: Increased opacity in the lower left lung is unchanged. Right lung remains clear. Tracheostomy tube and esophageal catheter are unchanged. IMPRESSION:  Unchanged left lower lung opacity. Electronically Signed   By: Ulyses Jarred M.D.   On: 04/12/2021 03:58      Medications:     Scheduled Medications:  amiodarone  200 mg Per Tube BID   aspirin  81 mg Per Tube Daily   atorvastatin  40 mg Per Tube Daily   bisacodyl  10 mg Oral Daily   Or   bisacodyl  10 mg Rectal Daily   chlorhexidine gluconate (MEDLINE KIT)  15 mL Mouth Rinse BID   Chlorhexidine Gluconate Cloth  6 each Topical Q0600   collagenase   Topical Daily   darbepoetin (ARANESP) injection - NON-DIALYSIS  60 mcg Subcutaneous Q Thu-1800   docusate  100 mg Per Tube BID   feeding supplement (PROSource TF)  45 mL Per Tube QID   insulin aspart  0-24 Units Subcutaneous Q4H   insulin glargine  10 Units Subcutaneous Daily   ipratropium-albuterol  3 mL Nebulization TID   lidocaine-EPINEPHrine  30 mL Intradermal Once   mouth rinse  15 mL Mouth Rinse 10 times per day  midodrine  30 mg Per Tube TID WC   multivitamin  1 tablet Per Tube QHS   nutrition supplement (JUVEN)  1 packet Per Tube BID BM   pantoprazole sodium  40 mg Per Tube Daily   polyethylene glycol  17 g Per Tube Daily   QUEtiapine  25 mg Per Tube BID   sodium chloride flush  10-40 mL Intracatheter Q12H   sodium chloride flush  3 mL Intravenous Q12H   Thrombi-Pad  1 each Topical Once    Infusions:   prismasol BGK 4/2.5 500 mL/hr at 04/12/21 0427    prismasol BGK 4/2.5 300 mL/hr at 04/11/21 2256   sodium chloride Stopped (04/10/21 0811)   dexmedetomidine (PRECEDEX) IV infusion 1.2 mcg/kg/hr (04/12/21 0800)   feeding supplement (VITAL 1.5 CAL) Stopped (04/12/21 0145)   heparin 2,750 Units/hr (04/12/21 0800)   lactated ringers     lactated ringers Stopped (04/01/21 1902)   meropenem (MERREM) IV Stopped (04/12/21 0707)   norepinephrine (LEVOPHED) Adult infusion 18 mcg/min (04/12/21 0800)   prismasol BGK 4/2.5 1,500 mL/hr at 04/12/21 0500   sodium phosphate  Dextrose 5% IVPB      PRN Medications: heparin, lactated  ringers, lidocaine (PF), lidocaine HCl (PF), lip balm, metoprolol tartrate, morphine injection, ondansetron (ZOFRAN) IV, oxyCODONE, sodium chloride, sodium chloride flush     Assessment/Plan   1. CAD: Severe 3VD with decreased EF.  Films reviewed with Dr. Ellyn Hack, PCI would be possible but would be difficult/high risk with multiple lesions and heavy calcification. 5/31 LIMA-LAD, SVG-RCA, SVG-OM, SVG-D. No s/s ischemic currently  - ASA 81 + atorvastatin 40 mg daily.  2. Acute/chronic HF with mid range EF => cardiogenic shock post-CABG: Suspect ischemic cardiomyopathy.  Echo 4/22 with EF 40-45% with moderately decreased RV systolic function and PASP 99, moderate TR. There was a prominent component of RV failure with RA pressure elevated out of proportion to PCWP (CVP/PCWP 0.875 on RHC) but PAPI adequate at 2.8. Post-op had PEA arrest then VT.  On 6/9, episode of altered mental status, progressive hypotension, and intubated.  Suspect septic shock, now improving.  Echo (6/9) with EF 45-50%, inferolateral severe hypokinesis, moderate RV dysfunction.  - NE 18 mcg . Continue to wean pressors as able.  - Suspect recurrent sepsis. Meropenum started 6/18 - CVVH ongoing. Keeping even - Continue midodrine 30 mg tid.  3. ESRD: Suspected due to diabetes.  Volume up post-op with multiple blood products. Now back to baseline.  - CVVH ongoing, keeping even. Appreciate Nephrology input - Needs tunneled HD catheter upper chest but need to wait until infection clears - Renal following  4. Acute respiratory failure - s/p trach 6/16 - failed SBT over past 2 days due to sepsis. Today there is a question about aspiration with tube feeds coming up around trach.  - CCM managing  5. 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  The CO was not markedly high.  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 Oxygen saturation  was 99% on RA pre-op, so no evidence for hepatopulmonary syndrome.   - Sildenafil has been on hold with pressors.  5. Cirrhosis: Noted on abdominal US from 2020.  H/o paracentesis.  Had workup at [facility] (though patient does not remember this), viral hepatitis labs negative.  They ended up think that the cirrhosis was cardiogenic.  Repeat abdominal US 5/23 w/ nodular hepatic parenchymal pattern with increased echogenicity consistent with the patient's known cirrhosis. No focal  hepatic abnormality identified. Portal vein is patent. Moderate ascites. GI has seen, patient had 4L paracentesis on 5/26. - He may eventually need repeat paracentesis.  6. Coagulopathy: Post-op bleeding in setting of coagulopathy post-CABG in patient with cirrhosis.  Currently seems controlled.  Platelets dropped then recovered, doubt HIT. - Platelets stable 274.  7. VT: Had after PEA arrest am 6/1.  - Continue amiodarone 200 bid per tube.  8. Anemia: ABLA.  Hgb stable at 10  9. Pleural effusions: ?hepatic hydrothorax.    - CXR today Unchanged LLL opacity  10. PEA arrest: Respiratory arrest post-extubation.  ROSC with CPR.  11. Acute hypoxemic respiratory failure:  Extubated 6/2, re-intubated 6/9. Self-extubated overnight 6/12 and re-intubated. Extubated 6/15 then re-intubated. Now s/p tracheostomy on FiO2 40% 12. Atrial arrhythmias: Atypical atrial flutter currently.   - On po amio - On heparin gtt - Eventual DCCV ?  13. ID:  Suspected HCAP with septic shock. Cultures with Staph epidermidis 1/2, suspect contaminant.  - Finished abx 6/16 - Recurrent sepsis physiology. Meropenum started 6/17.  - Suspect recurrent sepsis.  - ID signed off 6/15 will need to re consult.  14. Neuro: Altered mental status 6/9 with intubation.  CT head with no bleed or ischemic CVA seen.  EEG with diffuse encephalopathy.  6/15: sudden onset of unresponsiveness with no clear etiology. EEG unremarkable.  Asymmetric pupils.  Agitated 6/19-->Repeat  EEG 6/19 with mild diffuse encephalopathy.      Amy Clegg NP-C  04/12/2021 8:16 AM  Agree with above.   Remains on vent via trach. On CVVHD. Lethargic. Not following commands. Remains on NE at 60. On meropenum for recurrent sepsis. Pills suctioned out of trach  General:  cachetic weak appearing not following commands  HEENT: normal L pupil nonreactive Neck: supple. + trach  Cor: PMI nondisplaced. Regular rate & rhythm. No rubs, gallops or murmurs. Lungs: coarse Abdomen: soft, nontender, nondistended. No hepatosplenomegaly. No bruits or masses. Good bowel sounds. Extremities: no cyanosis, clubbing, rash, edema Large AVF LUE  Neuro: lethargic not following commands  Continues to struggle post-op. Very lethargic On NE and meropenum for sepsis. Concern for aspiration vs tracheo-esophageal fistula. CCM discussing with IR.   Continue to wean pressors as tolerated. Keep even on CVVHD.  Prognosis remains very concerning. May be worthwhile to engage Palliative Care to work through [employer].   CRITICAL CARE Performed by: Glori Bickers  Total critical care time: 35 minutes  Critical care time was exclusive of separately billable procedures and treating other patients.  Critical care was necessary to treat or prevent imminent or life-threatening deterioration.  Critical care was time spent personally by me (independent of midlevel providers or residents) on the following activities: development of treatment plan with patient and/or surrogate as well as nursing, discussions with consultants, evaluation of patient's response to treatment, examination of patient, obtaining history from patient or surrogate, ordering and performing treatments and interventions, ordering and review of laboratory studies, ordering and review of radiographic studies, pulse oximetry and re-evaluation of patient's condition.  Glori Bickers, MD  9:27 AM

## 2021-04-12 NOTE — Progress Notes (Signed)
Patient ID: David Rose., male   DOB: 1965-11-19, 55 y.o.   MRN: [id number] TCTS Evening Rounds:  MAP 56 on NE 32 and Midodrine 30 tid. He was on 18 NE this am.   Failed attempt at [facility] this am.  Glucose 29 this evening. Was 250 at noon. Give some D50. Hold off on Levemir for now. His tube feeds are on hold due to coiled feeding tube and tube feeds in trach.

## 2021-04-12 NOTE — Progress Notes (Signed)
RT NOTE: patient placed on 40% ATC.  Currently tolerating well.  Will continue to monitor.

## 2021-04-12 NOTE — Progress Notes (Signed)
[facility] Progress Note Patient Name: David Rose. DOB: 25-Jan-1966 MRN: [id number]   Date of Service  04/12/2021  HPI/Events of Note  Notified of what appears to be tube feeds around trach Temp 101.2 on meropenem for HCAP Tracheal culture 6/18 few GPC and rate gram variable rod. Reincubated  eICU Interventions  Get chest xray and reassess     Intervention Category Intermediate Interventions: Infection - evaluation and management  Shona Needles Bralin Garry 04/12/2021, 2:10 AM

## 2021-04-12 NOTE — Progress Notes (Signed)
No CPT at this time until feeding tube placement is verified.

## 2021-04-12 NOTE — Progress Notes (Addendum)
Sister updated at bedside on plan of care. She indicates the patient has a son who is estranged from and does not want communication.  She is unsure if he has a living will or POA.  She is tearful in discussion as she states she does not want him to unnecessarily suffer if he is not going to improve.  Note Palliative Care consultation from Cardiology.  Support offered to family.  No prior documentation in system of advanced care planning.    Noe Gens, MSN, APRN, NP-C, AGACNP-BC [facility] 04/12/2021, 11:22 AM   Please see [email or web address] for pager details.   From 7A-7P if no response, please call [phone] After hours, please call ELink [phone]

## 2021-04-12 NOTE — Progress Notes (Signed)
[facility] Progress Note Patient Name: David Rose. DOB: May 30, 1966 MRN: [id number]   Date of Service  04/12/2021  HPI/Events of Note  Notified of hypoglycemia 29 given D50 Tube feeding on hold due to dislodged tube Levemir placed on hold  eICU Interventions  Will start low dose dextrose 5% while feeding tube on hold. Refer back if patient develops hyperglycemia overnight     Intervention Category Major Interventions: Other:  Judd Lien 04/12/2021, 8:18 PM

## 2021-04-12 NOTE — Progress Notes (Signed)
[facility] ROUNDING NOTE   Subjective:   Interval History: 55 year old gentleman with history of hypertension diabetes mellitus type 2 end-stage renal disease on chronic hemodialysis.  History of congestive heart failure with systolic dysfunction, history of cirrhosis coronary artery disease status post cardiac arrest status post CABG.  2 episodes of sudden unresponsiveness 04/01/2020 04/07/2021.  Has been evaluated by neurology and underwent EEG monitoring as well as MRI of brain.  He was started on CRRT 03/26/2021 and continues on this mode of therapy.  Blood pressure 90/48 pulse 97 temperature 98.6 O2 sats 90% FiO2 40%  Sodium 137 potassium 4.9 chloride 103 CO2 28 BUN 32 creatinine 1.27 glucose 76 calcium 8.6 phosphorus 1.6 magnesium 2.4 albumin 2 hemoglobin 10  Fluid balance kept even anuric renal failure history of end-stage renal disease  Objective:  Vital signs in last 24 hours:  Temp:  [97.9 F (36.6 C)-101.2 F (38.4 C)] 98.6 F (37 C) (06/20 0430) Pulse Rate:  [97-107] 99 (06/20 0615) Resp:  [15-31] 25 (06/20 0615) BP: (73-139)/(42-119) 92/48 (06/20 0615) SpO2:  [94 %-100 %] 100 % (06/20 0615) FiO2 (%):  [40 %] 40 % (06/20 0320) Weight:  [61.4 kg] 61.4 kg (06/20 0500)  Weight change: -3.6 kg Filed Weights   04/10/21 0500 04/11/21 0545 04/12/21 0500  Weight: 65.7 kg 65 kg 61.4 kg    Intake/Output: I/O last 3 completed shifts: In: 5218.5 [I.V.:2058.2; NG/GT:2596.7; IV Piggyback:563.6] Out: 4710 [Other:4660; Stool:50]   Intake/Output this shift:  Total I/O In: 1401.6 [I.V.:716.6; NG/GT:585; IV Piggyback:100] Out: 1515 [Other:1515]  Gen: ill appearing, cachectic, looks exhausted NECK: trach in place, c/d/i CVS: irregular, tachycardic Resp: coarse mechanical bilaterally Abd: soft, + Suprapubic and RLQ pain- flinches Ext: sarcopenic, dependent thigh edema improved ACCESS: L nontunneled femoral HD catheter, R UE AVF + T/B NEURO: grimaces to pain, R pupil  4 mm sluggish, L pupil 6 mm nonreactive   Basic Metabolic Panel: Recent Labs  Lab 04/09/21 0029 04/09/21 1600 04/10/21 0037 04/10/21 1758 04/11/21 0111 04/11/21 1619 04/12/21 0055 04/12/21 0407  NA 134*   < > 136 135 135 135 134* 137  K 4.7   < > 4.5 4.9 4.8 5.0 6.2* 4.9  5.0  CL 100   < > 102 100 102 103 104 103  CO2 24   < > _0 19* 28  GLUCOSE 180*   < > 127* 194* 263* 131* 210* 76  BUN 21*   < > 30* 30* 29* 32* 34* 32*  CREATININE 1.37*   < > 1.16 1.35* 1.35* 1.33* 1.29* 1.27*  CALCIUM 8.2*   < > 8.4* 8.4* 8.3* 8.2* 8.2* 8.6*  MG 2.5*  --  2.5*  --  2.5*  --  2.4 2.4  PHOS 3.4   < > 1.9* 1.6* 1.5* 2.1* 2.2* 1.6*   < > = values in this interval not displayed.    Liver Function Tests: Recent Labs  Lab 04/07/21 1924 04/08/21 0427 04/10/21 1758 04/11/21 0111 04/11/21 1619 04/12/21 0055 04/12/21 0407  AST 41  --   --   --   --   --   --   ALT 21  --   --   --   --   --   --   ALKPHOS 202*  --   --   --   --   --   --   BILITOT 2.1*  --   --   --   --   --   --  PROT 7.0  --   --   --   --   --   --   ALBUMIN 2.3*   < > 2.1* 2.0* 2.0* 2.2* 2.0*   < > = values in this interval not displayed.   No results for input(s): LIPASE, AMYLASE in the last 168 hours. Recent Labs  Lab 04/07/21 2138  AMMONIA 18    CBC: Recent Labs  Lab 04/07/21 1924 04/07/21 2002 04/09/21 0029 04/10/21 0037 04/11/21 0111 04/12/21 0055 04/12/21 0407  WBC 29.5*   < > 26.8* 28.0* 29.0* 24.8* 25.6*  NEUTROABS 28.0*  --   --   --   --   --   --   HGB 11.7*   < > 10.1* 9.7* 10.4* 11.2* 10.0*  HCT 36.8*   < > 32.4* 31.4* 34.3* 36.8* 33.4*  MCV 97.6   < > 98.5 100.6* 101.8* 104.0* 104.4*  PLT 215   < > 237 208 266 202 274   < > = values in this interval not displayed.    Cardiac Enzymes: No results for input(s): CKTOTAL, CKMB, CKMBINDEX, TROPONINI in the last 168 hours.  BNP: Invalid input(s): POCBNP  CBG: Recent Labs  Lab 04/11/21 1125 04/11/21 1509 04/11/21 1956  04/12/21 0013 04/12/21 0441  GLUCAP 190* 141* 165* 89* 105*    Microbiology: Results for orders placed or performed during the hospital encounter of 03/03/2021  Surgical PCR screen     Status: None   Collection Time: 02/22/2021  2:37 PM   Specimen: Nasopharyngeal Swab; Nasal Swab  Result Value Ref Range Status   MRSA, PCR NEGATIVE NEGATIVE Final   Staphylococcus aureus NEGATIVE NEGATIVE Final    Comment: (NOTE) The Xpert SA Assay (FDA approved for NASAL specimens in patients 38 years of age and older), is one component of a comprehensive surveillance program. It is not intended to diagnose infection nor to guide or monitor treatment. Performed at [facility], [place] [address] [place], [place] [address]   SARS CORONAVIRUS 2 (TAT 6-24 HRS) Nasopharyngeal Nasopharyngeal Swab     Status: None   Collection Time: 03/11/2021  3:12 PM   Specimen: Nasopharyngeal Swab  Result Value Ref Range Status   SARS Coronavirus 2 NEGATIVE NEGATIVE Final    Comment: (NOTE) SARS-CoV-2 target nucleic acids are NOT DETECTED.  The SARS-CoV-2 RNA is generally detectable in upper and lower respiratory specimens during the acute phase of infection. Negative results do not preclude SARS-CoV-2 infection, do not rule out co-infections with other pathogens, and should not be used as the sole basis for treatment or other patient management decisions. Negative results must be combined with clinical observations, patient history, and epidemiological information. The expected result is Negative.  Fact Sheet for Patients: SugarRoll.be  Fact Sheet for Healthcare Providers: https://www.woods-mathews.com/  This test is not yet approved or cleared by the [place] FDA and  has been authorized for detection and/or diagnosis of SARS-CoV-2 by FDA under an Emergency Use Authorization (EUA). This EUA will remain  in effect (meaning this test can be used) for the duration  of the COVID-19 declaration under Se ction 564(b)(1) of the Act, 21 U.S.C. section 360bbb-3(b)(1), unless the authorization is terminated or revoked sooner.  Performed at [facility], [place] [address] [place], [place] [address]   Surgical pcr screen     Status: None   Collection Time: 03/01/2021  2:43 AM   Specimen: Nasal Mucosa; Nasal Swab  Result Value Ref Range Status  MRSA, PCR NEGATIVE NEGATIVE Final   Staphylococcus aureus NEGATIVE NEGATIVE Final    Comment: (NOTE) The Xpert SA Assay (FDA approved for NASAL specimens in patients 18 years of age and older), is one component of a comprehensive surveillance program. It is not intended to diagnose infection nor to guide or monitor treatment. Performed at [facility], [place] [address] [place], [place] [address]   MRSA PCR Screening     Status: None   Collection Time: 03/28/21  1:18 PM   Specimen: Nasopharyngeal  Result Value Ref Range Status   MRSA by PCR NEGATIVE NEGATIVE Final    Comment:        The GeneXpert MRSA Assay (FDA approved for NASAL specimens only), is one component of a comprehensive MRSA colonization surveillance program. It is not intended to diagnose MRSA infection nor to guide or monitor treatment for MRSA infections. Performed at [facility], [place] [address] [place], [place] [address]   Culture, Urine     Status: None   Collection Time: 04/02/21  8:17 AM   Specimen: Urine, Random  Result Value Ref Range Status   Specimen Description URINE, RANDOM  Final   Special Requests NONE  Final   Culture   Final    NO GROWTH Performed at [facility], [place] [address], [place], [place] [address]    Report Status 04/03/2021 FINAL  Final  Culture, blood (routine x 2)     Status: Abnormal   Collection Time: 04/02/21 11:44 AM   Specimen: BLOOD LEFT HAND  Result Value Ref Range Status   Specimen Description BLOOD LEFT HAND  Final   Special Requests   Final    BOTTLES DRAWN AEROBIC ONLY  Blood Culture results may not be optimal due to an inadequate volume of blood received in culture bottles   Culture  Setup Time   Final    GRAM POSITIVE COCCI IN CLUSTERS AEROBIC BOTTLE ONLY CRITICAL RESULT CALLED TO, READ BACK BY AND VERIFIED WITH: PHARMD LAURA SEAY BY MESSAN H. AT 0225 ON 04/05/2021    Culture (A)  Final    STAPHYLOCOCCUS EPIDERMIDIS THE SIGNIFICANCE OF ISOLATING THIS ORGANISM FROM A SINGLE VENIPUNCTURE CANNOT BE PREDICTED WITHOUT FURTHER CLINICAL AND CULTURE CORRELATION. SUSCEPTIBILITIES AVAILABLE ONLY ON REQUEST. Performed at [facility], [place] [address], [place], [place] [address]    Report Status 04/06/2021 FINAL  Final  Blood Culture ID Panel (Reflexed)     Status: Abnormal   Collection Time: 04/02/21 11:44 AM  Result Value Ref Range Status   Enterococcus faecalis NOT DETECTED NOT DETECTED Final   Enterococcus Faecium NOT DETECTED NOT DETECTED Final   Listeria monocytogenes NOT DETECTED NOT DETECTED Final   Staphylococcus species DETECTED (A) NOT DETECTED Final    Comment: CRITICAL RESULT CALLED TO, READ BACK BY AND VERIFIED WITH: PHARMD LAURA SEAY BY MESSAN H. AT 0225 ON 04/05/2021    Staphylococcus aureus (BCID) NOT DETECTED NOT DETECTED Final   Staphylococcus epidermidis DETECTED (A) NOT DETECTED Final    Comment: Methicillin (oxacillin) resistant coagulase negative staphylococcus. Possible blood culture contaminant (unless isolated from more than one blood culture draw or clinical case suggests pathogenicity). No antibiotic treatment is indicated for blood  culture contaminants. CRITICAL RESULT CALLED TO, READ BACK BY AND VERIFIED WITH: PHARMD LAURA SEAY BY MESSAN H. AT 0225 ON 04/05/2021    Staphylococcus lugdunensis NOT DETECTED NOT DETECTED Final   Streptococcus species NOT DETECTED NOT DETECTED Final   Streptococcus agalactiae  NOT DETECTED NOT DETECTED Final   Streptococcus pneumoniae NOT DETECTED NOT DETECTED Final   Streptococcus pyogenes NOT  DETECTED NOT DETECTED Final   A.calcoaceticus-baumannii NOT DETECTED NOT DETECTED Final   Bacteroides fragilis NOT DETECTED NOT DETECTED Final   Enterobacterales NOT DETECTED NOT DETECTED Final   Enterobacter cloacae complex NOT DETECTED NOT DETECTED Final   Escherichia coli NOT DETECTED NOT DETECTED Final   Klebsiella aerogenes NOT DETECTED NOT DETECTED Final   Klebsiella oxytoca NOT DETECTED NOT DETECTED Final   Klebsiella pneumoniae NOT DETECTED NOT DETECTED Final   Proteus species NOT DETECTED NOT DETECTED Final   Salmonella species NOT DETECTED NOT DETECTED Final   Serratia marcescens NOT DETECTED NOT DETECTED Final   Haemophilus influenzae NOT DETECTED NOT DETECTED Final   Neisseria meningitidis NOT DETECTED NOT DETECTED Final   Pseudomonas aeruginosa NOT DETECTED NOT DETECTED Final   Stenotrophomonas maltophilia NOT DETECTED NOT DETECTED Final   Candida albicans NOT DETECTED NOT DETECTED Final   Candida auris NOT DETECTED NOT DETECTED Final   Candida glabrata NOT DETECTED NOT DETECTED Final   Candida krusei NOT DETECTED NOT DETECTED Final   Candida parapsilosis NOT DETECTED NOT DETECTED Final   Candida tropicalis NOT DETECTED NOT DETECTED Final   Cryptococcus neoformans/gattii NOT DETECTED NOT DETECTED Final   Methicillin resistance mecA/C DETECTED (A) NOT DETECTED Final    Comment: CRITICAL RESULT CALLED TO, READ BACK BY AND VERIFIED WITH: PHARMD LAURA SEAY BY MESSAN H. AT 0225 ON 04/05/2021 Performed at [facility], [address], [place], [place] [address]   Expectorated Sputum Assessment w Gram Stain, Rflx to Resp Cult     Status: None   Collection Time: 04/02/21  3:19 PM   Specimen: Expectorated Sputum  Result Value Ref Range Status   Specimen Description EXPECTORATED SPUTUM  Final   Special Requests NONE  Final   Sputum evaluation   Final    THIS SPECIMEN IS ACCEPTABLE FOR SPUTUM CULTURE Performed at [facility], [place] [address], [place], [place]  [address]    Report Status 04/03/2021 FINAL  Final  Culture, Respiratory w Gram Stain     Status: None   Collection Time: 04/02/21  3:19 PM  Result Value Ref Range Status   Specimen Description EXPECTORATED SPUTUM  Final   Special Requests NONE Reflexed from [id number]  Final   Gram Stain   Final    MODERATE WBC PRESENT,BOTH PMN AND MONONUCLEAR ABUNDANT YEAST    Culture   Final    ABUNDANT Normal respiratory flora-no Staph aureus or Pseudomonas seen Performed at [facility], [place] [address], [place], [place] [address]    Report Status 04/05/2021 FINAL  Final  Culture, Respiratory w Gram Stain     Status: None (Preliminary result)   Collection Time: 04/10/21 10:46 AM   Specimen: Tracheal Aspirate; Respiratory  Result Value Ref Range Status   Specimen Description TRACHEAL ASPIRATE  Final   Special Requests NONE  Final   Gram Stain   Final    ABUNDANT WBC PRESENT,BOTH PMN AND MONONUCLEAR FEW GRAM POSITIVE COCCI RARE GRAM VARIABLE ROD    Culture   Final    CULTURE REINCUBATED FOR BETTER GROWTH Performed at [facility], [place] [address], [place], [place] [address]    Report Status PENDING  Incomplete    Coagulation Studies: No results for input(s): LABPROT, INR in the last 72 hours.  Urinalysis: No results for input(s): COLORURINE, LABSPEC, [place], [place], [place], BILIRUBINUR, KETONESUR,  PROTEINUR, UROBILINOGEN, NITRITE, LEUKOCYTESUR in the last 72 hours.  Invalid input(s): APPERANCEUR    Imaging: US Abdomen Complete  Result Date: 04/11/2021 CLINICAL DATA:  Abdominal pain EXAM: ABDOMEN ULTRASOUND COMPLETE COMPARISON:  Ultrasound abdomen 04/03/2021. CT abdomen pelvis 04/02/2021 FINDINGS: Gallbladder: Gallbladder sludge. 8 mm gallstone. Negative sonographic Murphy sign. Gallbladder wall mildly thickened 4.5 mm. Negative for pericholecystic fluid. Common bile duct: Diameter: 4.0 mm Liver: Cirrhosis of the liver. Coarse echotexture without focal liver lesion. Portal vein is  patent on color Doppler imaging with normal direction of blood flow towards the liver. IVC: No abnormality visualized. Pancreas: Visualized portion unremarkable. Spleen: Size and appearance within normal limits. Right Kidney: Length: 10.0 cm. Echogenicity within normal limits. No mass or hydronephrosis visualized. Left Kidney: Not visualized due to bowel gas. Abdominal aorta: Atherosclerotic aorta without aneurysm. Other findings: Limited study due to inability of the patient to hold breath and hold still. Small ascites.  Right pleural effusion noted. IMPRESSION: Gallbladder sludge with gallstone. Mild gallbladder wall thickening. Negative sonographic Murphy sign. Cirrhosis liver with mild ascites Right pleural effusion Electronically Signed   By: Franchot Gallo M.D.   On: 04/11/2021 16:17   DG Chest Port 1 View  Result Date: 04/12/2021 CLINICAL DATA:  Pneumonia EXAM: PORTABLE CHEST 1 VIEW COMPARISON:  04/10/2021 FINDINGS: Increased opacity in the lower left lung is unchanged. Right lung remains clear. Tracheostomy tube and esophageal catheter are unchanged. IMPRESSION: Unchanged left lower lung opacity. Electronically Signed   By: Ulyses Jarred M.D.   On: 04/12/2021 03:58     Medications:     prismasol BGK 4/2.5 500 mL/hr at 04/12/21 0427    prismasol BGK 4/2.5 300 mL/hr at 04/11/21 2256   sodium chloride Stopped (04/10/21 0811)   dexmedetomidine (PRECEDEX) IV infusion 1.2 mcg/kg/hr (04/12/21 0600)   feeding supplement (VITAL 1.5 CAL) Stopped (04/12/21 0145)   heparin 2,750 Units/hr (04/12/21 0600)   lactated ringers     lactated ringers Stopped (04/01/21 1902)   meropenem (MERREM) IV Stopped (04/11/21 2229)   norepinephrine (LEVOPHED) Adult infusion 17 mcg/min (04/12/21 0600)   prismasol BGK 4/2.5 1,500 mL/hr at 04/12/21 0500    amiodarone  200 mg Per Tube BID   aspirin  81 mg Per Tube Daily   atorvastatin  40 mg Per Tube Daily   bisacodyl  10 mg Oral Daily   Or   bisacodyl  10 mg Rectal  Daily   chlorhexidine gluconate (MEDLINE KIT)  15 mL Mouth Rinse BID   Chlorhexidine Gluconate Cloth  6 each Topical Q0600   collagenase   Topical Daily   darbepoetin (ARANESP) injection - NON-DIALYSIS  60 mcg Subcutaneous Q Thu-1800   docusate  100 mg Per Tube BID   feeding supplement (PROSource TF)  45 mL Per Tube QID   insulin aspart  0-24 Units Subcutaneous Q4H   insulin glargine  10 Units Subcutaneous Daily   ipratropium-albuterol  3 mL Nebulization TID   lidocaine-EPINEPHrine  30 mL Intradermal Once   mouth rinse  15 mL Mouth Rinse 10 times per day   midodrine  30 mg Per Tube TID WC   multivitamin  1 tablet Per Tube QHS   nutrition supplement (JUVEN)  1 packet Per Tube BID BM   pantoprazole sodium  40 mg Per Tube Daily   polyethylene glycol  17 g Per Tube Daily   QUEtiapine  25 mg Per Tube BID   sodium chloride flush  10-40 mL Intracatheter Q12H   sodium chloride flush  3 mL Intravenous Q12H   Thrombi-Pad  1 each Topical Once   heparin, lactated ringers, lidocaine (PF), lidocaine HCl (PF), lip balm, metoprolol tartrate, morphine injection, ondansetron (ZOFRAN) IV, oxyCODONE, sodium chloride, sodium chloride flush  Assessment/ Plan:  End-stage renal disease.Tuesday Thursday Saturday dialysis [place].  Has been on CRRT 04/01/2021.  Being kept even. Acute encephalopathy periods of unresponsiveness appreciate assistance from neurology Septic shock broad-spectrum antibiotics as per primary care service CAD status post CABG x4 complicated by PEA arrest Acute on chronic congestive heart failure repeat 2D echo 45% Pulmonary hypertension per heart failure team Hyperkalemia resolved with CRRT Hypophosphatemia continue to replete Atrial fibrillation/flutter on systemic heparin    LOS: [place] _0 _1 :31 AM

## 2021-04-12 NOTE — Progress Notes (Addendum)
NAME:  David Arth., MRN:  [id number], DOB:  Jan 14, 1966, LOS: 43 ADMISSION DATE:  03/04/2021, CONSULTATION DATE:  04/11/2021 REFERRING MD:  TCTS CHIEF COMPLAINT: Code   History of Present Illness:  55 year old man with PMHx significant for HTN, T2DM, PAD, ESRD, cirrhosis (query cardiac etiology) who presented for symptomatic ischemic cardiomyopathy.    Patient underwent preoperative optimization with CHF, GI, and Nephrology teams with eventual CABG 5/31. On 6/1, patient developed respiratory distress followed by PEA arrest requiring CPR. Code was c/b increased bleeding around surgical sites, prompting mediastinal exploration 6/1. Intraoperative findings were notable for bleeding around mammary graft site and diffuse oozing for which patient received multiple blood products.   Patient intubated and on pressors postoperatively and PCCM was consulted for assistance with management.   Significant Hospital Events: Including procedures, antibiotic start and stop dates in addition to other pertinent events   5/23 Admitted 5/26 Paracentesis 4L 5/30 HD 5/31 CABG 5/31- 6/1 Code Blue, OR for mediastinal exploration 2/2 hemorrhagic shock induced by CPR. Factor 7, protamine, FFP, cryo given 6/1 PCCM consult, 4U PRBCs and 1U Plt . R pigtail chest tube placed due to limitation of vent weaning. 6/2 Extubated, chest tube 3.2L out past 24 hrs 6/3 Remains on 2L [place], chest tube with 500cc out/ 24 hrs 6/4 Remains on CRRT, 2L [place], [place], [place]. 3 chest tubes remain. 6/6 Remains on CRRT, 4 chest tubes in place 6/9 Decreasing pressor requirements and CT output. CRRT transitioned to iHD. Echo repeated. Prior to HD session, patient suddenly became unresponsive with fixed and dilated pupils, minimal gag, breathing spontaneously. Code Stroke called. Reintubated, heparin d/c'ed, STAT CT Head/CTA. 6/10, WBC jump to 89 K, Temp of 102,Remains on 40 of Levo, 40 of Neo, and Vaso at 0.03.Pupils are 6 and non-responsive to  light. Trialysis cath inserted for CVVHD. R IJ is 18 days old, will get PICC and discontinue. Lactate of 2.8. Pigtail cath removed  6/12 coming down on pressors? Patient self extubated and reintubated 6/13 MRI negative for acute findings 6/15 Patient extubated; spoke with family and patient, if he fails extubation trial and requires reintubation they are okay with trach placement. 6/16 placed on cont sedation for trach. Trach completed. Started on LTM for episodes of being unresponsive.  IV cefepime stopped, completing course of antibiotics for pneumonia no organism specified 6/17 fent gtt stopped. Was actually fully awake even on fent gtt. Beginning ATC trials w/ plan for mandatory vent at night.  6/20 Concern for TF in tracheal aspirate.  Last BM 6/19.   Interim History / Subjective:  Tmax 101.2 /  WBC 25.6 RN reports concern for possible TF coming out of trach / ballard with suctioning > pt noted to gurgle after meds given, pink tinged secretions from tubing (meds were pink).  Restrained overnight / kicking staff Respiratory culture pending, on meropenem Glucose range 105-228 Phos 1.6 > replaced per Nephrology  I/O 3.3L UF, even balance in last 24 hours (overall negative)  Objective   Blood pressure 113/60, pulse 97, temperature 98.6 F (37 C), temperature source Axillary, resp. rate 20, height _0  (1.88 m), weight 61.4 kg, SpO2 100 %. 4LNC    Vent Mode: PSV;CPAP FiO2 (%):  [40 %] 40 % Set Rate:  [20 bmp] 20 bmp Vt Set:  [570 mL] 570 mL PEEP:  [5 cmH20] 5 cmH20 Pressure Support:  [10 cmH20] 10 cmH20 Plateau Pressure:  [21 cmH20] 21 cmH20   Intake/Output Summary (Last 24 hours) at 04/12/2021 0908  Last data filed at 04/12/2021 0800 Gross per 24 hour  Intake 3205.02 ml  Output 3321 ml  Net -115.98 ml   Filed Weights   04/10/21 0500 04/11/21 0545 04/12/21 0500  Weight: 65.7 kg 65 kg 61.4 kg   Physical Examination: General: critically ill appearing adult male lying in bed in  NAD HEENT: MM pink/moist, trach midline with yellow sputum at site, pink tinged secretions from ballard, anicteric, temporal wasting Neuro: eyes closed, will move all extremities spontaneously CV: s1s2 rrr, ST on monitor currently, no m/r/g PULM: non-labored on PSV 10/5, lungs bilaterally with rhonchi GI: soft, bsx4 active, cortrak in place Extremities: warm/dry, no edema  Skin: no rashes or lesions   Resolved Hospital Problem list   Fever Post-op mediastinal hemorrhage Cirrhotic and hemorrhage- induced consumptive coagulopathy Hemorrhagic shock, resolved Acute blood loss anemia Thrombocytopenia- in setting of blood loss, coagulopathy, and cirrhosis Elevated Alk Phos/AST VT 6/1  Assessment & Plan:   CAD post CABG c/b  in hospital PEA cardiac arrest precipitated by hypoxia and blood loss Cardiac Cirrhosis with ascites Multifactorial shock Chronic Afib/flutter Acute metabolic encephalopathy Acute hypoxemic respiratory failure due to inability to protect airway Pulmonary edema and Bilateral pleural effusions (chest tubes removed.) and LLL HAP vs pleural effusion Status post tracheostomy 6/16. ESRD on HD PTA Hyperkalemia  Hypophosphatemia   DM2  Sacral pressure ulcer Rule Out TE Fistula / Upper Airway Reflux   Plan -PRVC as rest mode ventilation  -goal PSV with ATC intervals during the day  -continue meropenem -follow up respiratory culture  -continue levophed for MAP >65  -assess cortisol  -UF for goal of euvolemia, may need a little fluid back at this point given increased pressor needs  -phosphorous replaced  -hold TF for now, transition cortrak to post pyloric.  Unable to assess esophagram to rule out fistula as pt can not swallow.  Low yield from CT imaging for fistula.  Will provide esophageal "rest" with post pyloric feeding -resume TF once cortrak in place -after review of above, will need to determine if patient can continue PO meds vs transition to IV for  AF -mobilize as able - HOB elevated  -increase PPI to BID  -follow up pathology smear review, both parents deceased of leukemia -tele monitoring   Best Practice (right click and "Reselect all SmartList Selections" daily)   Diet/type: tubefeeds Pain/Anxiety/Delirium protocol Yes and RASS goal: 0 VAP protocol (if indicated): Yes DVT prophylaxis: systemic heparin GI prophylaxis: PPI Glucose control:  SSI and Basal coverage Central venous access:  Yes, and it is still needed Arterial line:  N/A Foley:  N/A Mobility:  OOB  PT consulted: Yes Studies pending: None Code Status:  full code Last date of multidisciplinary goals of care discussion [per primary ] placing care management consult. Disposition will be a challenge  Disposition: remains critically ill, will stay in intensive care   CRITICAL CARE TIME: 27 minutes     Noe Gens, MSN, APRN, NP-C, AGACNP-BC [facility] 04/12/2021, 9:08 AM   Please see [email or web address] for pager details.   From 7A-7P if no response, please call [phone] After hours, please call ELink [phone]

## 2021-04-12 NOTE — Consult Note (Signed)
Consultation Note Date: 04/12/2021   Patient Name: David Rose.  DOB: 1966-03-20  MRN: [id number]  Age / Sex: 55 y.o., male  PCP: Pallone, Jarvis Newcomer, MD Referring Physician: Wonda Olds, MD  Reason for Consultation: Establishing goals of care and Psychosocial/spiritual support  HPI/Patient Profile:   55 year old man with PMHx significant for HTN, T2DM, PAD, ESRD, cirrhosis (query cardiac etiology) who presented for symptomatic ischemic cardiomyopathy.     Patient underwent preoperative optimization with CHF, GI, and Nephrology teams with eventual CABG 5/31. On 6/1, patient developed respiratory distress followed by PEA arrest requiring CPR. Code was c/b increased bleeding around surgical sites, prompting mediastinal exploration 6/1. Intraoperative findings were notable for bleeding around mammary graft site and diffuse oozing for which patient received multiple blood products.   Patient intubated and on pressors postoperatively and PCCM was consulted for assistance with management.     Significant Hospital Events: Including procedures, antibiotic start and stop dates in addition to other pertinent events  5/23 Admitted 5/26 Paracentesis 4L 5/30 HD 5/31 CABG 5/31- 6/1 Code Blue, OR for mediastinal exploration 2/2 hemorrhagic shock induced by CPR. Factor 7, protamine, FFP, cryo given 6/1 PCCM consult, 4U PRBCs and 1U Plt . R pigtail chest tube placed due to limitation of vent weaning. 6/2 Extubated, chest tube 3.2L out past 24 hrs 6/3 Remains on 2L [place], chest tube with 500cc out/ 24 hrs 6/4 Remains on CRRT, 2L [place], [place], [place]. 3 chest tubes remain. 6/6 Remains on CRRT, 4 chest tubes in place 6/9 Decreasing pressor requirements and CT output. CRRT transitioned to iHD. Echo repeated. Prior to HD session, patient suddenly became unresponsive with fixed and dilated pupils, minimal gag, breathing  spontaneously. Code Stroke called. Reintubated, heparin d/c'ed, STAT CT Head/CTA. 6/10, WBC jump to 89 K, Temp of 102,Remains on 40 of Levo, 40 of Neo, and Vaso at 0.03.Pupils are 6 and non-responsive to light. Trialysis cath inserted for CVVHD. R IJ is 36 days old, will get PICC and discontinue. Lactate of 2.8. Pigtail cath removed 6/12 coming down on pressors? Patient self extubated and reintubated 6/13 MRI negative for acute findings 6/15 Patient extubated; spoke with family and patient, if he fails extubation trial and requires reintubation they are okay with trach placement. 6/16 placed on cont sedation for trach. Trach completed. Started on LTM for episodes of being unresponsive.  IV cefepime stopped, completing course of antibiotics for pneumonia no organism specified 6/17 fent gtt stopped. Was actually fully awake even on fent gtt. Beginning ATC trials w/ plan for mandatory vent at night. 6/20 Concern for TF in tracheal aspirate.  Last BM 6/19.    Complicated 33-OVA hospitalization.  Unfortunately Mr. Strycharz is not responding as hoped for full medical support.  Family face treatment option decisions, advanced directive decisions and anticipatory care needs.    Clinical Assessment and Goals of Care:   This NP Wadie Lessen reviewed medical records, received report from team, assessed the patient and then meet at the patient's bedside  along with  his sister Rema Fendt and her husband to discuss diagnosis, prognosis, GOC, EOL wishes disposition and options.   Concept of Palliative Care was introduced as specialized medical care for people and their families living with serious illness.  If focuses on providing relief from the symptoms and stress of a serious illness.  The goal is to improve quality of life for both the patient and the family.  Created space and opportunity for  family to explore thoughts and feelings regarding current medical situation.  Patient's sister verbalizes her  understanding of the seriousness of the current medical situation.  They understand that Mr. Barcelona's complicated medical situation prior to this hospitalization.  Cheryl verbalizes that she does not want to see her brother suffer.  Plan is for Malachy Mood to speak to her other 2 siblings tonight and we will have a conference call tomorrow to 2: 30 for ongoing discussion regarding next steps in treatment plan.     A  discussion was had today regarding advanced directives.  Concepts specific to code status, artifical feeding and hydration, continued IV antibiotics and rehospitalization was had.  The difference between a aggressive medical intervention path  and a palliative comfort care path for this patient at this time was had.  MOST form introduced   Hard Choices left for review   Natural trajectory and expectations at EOL were discussed.  Questions and concerns addressed.  Patient  encouraged to call with questions or concerns.     PMT will continue to support holistically.     No documented H POA or living well.  Patient has 1 living son but he is estranged from him.  He has 4 living adult siblings he has a relationship with only 3 of them.    His sister Rema Fendt  # [phone] is the main contact  SUMMARY OF RECOMMENDATIONS    Code Status/Advance Care Planning: Full code Encouraged patient/family to consider DNR/DNI status understanding evidenced based poor outcomes in similar hospitalized patient, as the cause of arrest is likely associated with advanced chronic illness rather than an easily reversible acute cardio-pulmonary event.    Symptom Management:  Per attending  Palliative Prophylaxis:  Aspiration, Bowel Regimen, Delirium Protocol, Frequent Pain Assessment, and Oral Care  Additional Recommendations (Limitations, Scope, Preferences): Full Scope Treatment  Psycho-social/Spiritual:  Desire for further Chaplaincy support:no Additional Recommendations: Grief/Bereavement  Support  Prognosis:   Long-term poor prognosis, family in the process of decisions related to ongoing full medical support versus a comfort path.   Discharge Planning: To Be Determined      Primary Diagnoses: Present on Admission:  Acute on chronic systolic heart failure ([place])   I have reviewed the medical record, interviewed the patient and family, and examined the patient. The following aspects are pertinent.  Past Medical History:  Diagnosis Date   ESRD (end stage renal disease) ([place])    Hypertension    Peripheral arterial disease ([place])    Type 2 diabetes mellitus ([place])    Social History   Socioeconomic History   Marital status: Divorced    Spouse name: Not on file   Number of children: Not on file   Years of education: Not on file   Highest education level: Not on file  Occupational History   Not on file  Tobacco Use   Smoking status: Never   Smokeless tobacco: Never  Substance and Sexual Activity   Alcohol use: Not on file   Drug use: Not on file   Sexual activity:  Not on file  Other Topics Concern   Not on file  Social History Narrative   Pt has 1 year of college.    Social Determinants of Health   Financial Resource Strain: Not on file  Food Insecurity: Not on file  Transportation Needs: Not on file  Physical Activity: Not on file  Stress: Not on file  Social Connections: Not on file   Family History  Problem Relation Age of Onset   Thyroid cancer Mother    Leukemia Father    Scheduled Meds:  aspirin  81 mg Per Tube Daily   atorvastatin  40 mg Per Tube Daily   bisacodyl  10 mg Oral Daily   Or   bisacodyl  10 mg Rectal Daily   chlorhexidine gluconate (MEDLINE KIT)  15 mL Mouth Rinse BID   Chlorhexidine Gluconate Cloth  6 each Topical Q0600   collagenase   Topical Daily   darbepoetin (ARANESP) injection - NON-DIALYSIS  60 mcg Subcutaneous Q Thu-1800   docusate  100 mg Per Tube BID   feeding supplement (PROSource TF)  45 mL Per Tube QID    insulin aspart  0-24 Units Subcutaneous Q4H   insulin glargine  10 Units Subcutaneous Daily   ipratropium-albuterol  3 mL Nebulization TID   mouth rinse  15 mL Mouth Rinse 10 times per day   midodrine  30 mg Per Tube TID WC   multivitamin  1 tablet Per Tube QHS   nutrition supplement (JUVEN)  1 packet Per Tube BID BM   pantoprazole sodium  40 mg Per Tube BID   polyethylene glycol  17 g Per Tube Daily   QUEtiapine  25 mg Per Tube BID   sodium chloride flush  10-40 mL Intracatheter Q12H   sodium chloride flush  3 mL Intravenous Q12H   Continuous Infusions:   prismasol BGK 4/2.5 500 mL/hr at 04/12/21 0427    prismasol BGK 4/2.5 300 mL/hr at 04/11/21 2256   sodium chloride Stopped (04/10/21 0811)   amiodarone     dexmedetomidine (PRECEDEX) IV infusion 1.2 mcg/kg/hr (04/12/21 1200)   feeding supplement (VITAL 1.5 CAL) Stopped (04/12/21 0145)   heparin 2,750 Units/hr (04/12/21 1200)   lactated ringers     lactated ringers Stopped (04/01/21 1902)   meropenem (MERREM) IV Stopped (04/12/21 0707)   norepinephrine (LEVOPHED) Adult infusion 30 mcg/min (04/12/21 1200)   prismasol BGK 4/2.5 1,500 mL/hr at 04/12/21 0857   sodium phosphate  Dextrose 5% IVPB 43 mL/hr at 04/12/21 1200   PRN Meds:.heparin, lactated ringers, lidocaine (PF), lidocaine HCl (PF), lip balm, metoprolol tartrate, morphine injection, ondansetron (ZOFRAN) IV, oxyCODONE, polyvinyl alcohol, sodium chloride, sodium chloride flush Medications Prior to Admission:  Prior to Admission medications   Medication Sig Start Date End Date Taking? Authorizing Provider  Eluxadoline (VIBERZI) 75 MG TABS Take 75 mg by mouth in the morning and at bedtime.   Yes [provider]  furosemide (LASIX) 80 MG tablet Take 80 mg by mouth daily as needed for fluid. 02/20/20  Yes [provider]  lidocaine-prilocaine (EMLA) cream Apply 1 application topically as needed (before dialysis).   Yes [provider]  metoprolol  succinate (TOPROL-XL) 50 MG 24 hr tablet Take 50 mg by mouth every evening. Take with or immediately following a meal.   Yes [provider]  nitroGLYCERIN (NITROSTAT) 0.4 MG SL tablet Place 1 tablet (0.4 mg total) under the tongue every 5 (five) minutes as needed. Patient taking differently: Place 0.4 mg under  the tongue every 5 (five) minutes x 3 doses as needed for chest pain. 02/24/21  Yes Reino Bellis B, NP  sevelamer carbonate (RENVELA) 800 MG tablet Take 1,600 mg by mouth 3 (three) times daily with meals.   Yes [provider]   Allergies  Allergen Reactions   Shellfish-Derived Products Anaphylaxis   Wasp Venom Protein Anaphylaxis   Penicillins Other (See Comments)    Childhood reaction   Review of Systems  Unable to perform ROS: Intubated   Physical Exam Constitutional:      Appearance: He is cachectic. He is ill-appearing.     Interventions: He is intubated.  Cardiovascular:     Rate and Rhythm: Normal rate.  Pulmonary:     Effort: He is intubated.  Musculoskeletal:     Comments: -Generalized atrophy    Vital Signs: BP (!) 95/49   Pulse 94   Temp 98.6 F (37 C) (Axillary)   Resp 18   Ht '6\' 2"'  (1.88 m)   Wt 61.4 kg   SpO2 100%   BMI 17.38 kg/m  Pain Scale: CPOT POSS *See Group Information*: 1-Acceptable,Awake and alert Pain Score: 0-No pain   SpO2: SpO2: 100 % O2 Device:SpO2: 100 % O2 Flow Rate: .O2 Flow Rate (L/min): 10 L/min  IO: Intake/output summary:  Intake/Output Summary (Last 24 hours) at 04/12/2021 1230 Last data filed at 04/12/2021 1200 Gross per 24 hour  Intake 3345.82 ml  Output 3454 ml  Net -108.18 ml    LBM: Last BM Date: 04/11/21 Baseline Weight: Weight: 79.4 kg Most recent weight: Weight: 61.4 kg     Palliative Assessment/Data: 20 %      Discussed with bedside RN  Time In: 1200 Time Out: 1310 Time Total: 70 minutes Greater than 50%  of this time was spent counseling and coordinating care related to the above  assessment and plan.  Signed by: Wadie Lessen, NP   Please contact Palliative Medicine Team phone at [phone] for questions and concerns.  For individual provider: See Shea Evans

## 2021-04-12 NOTE — Progress Notes (Signed)
Nutrition Follow-up  DOCUMENTATION CODES:   Severe malnutrition in context of chronic illness  INTERVENTION:   Recommend patient have Cortrak tube advanced by radiology; if this unable to occur, recommend considering initiation of TPN  Ultimately, if aggressive nutrition intervention algins with GOC, pt will require a permanent feeding access  Tube Feeding via Cortrak when able to resume: Do not recommend utilizing until advanced to post pyloric position Vital 1.5 at 65 ml/hr Begin TF at 20 ml/hr; titrate by 10 mL q 4 hours until goal rate Pro-Source TF 45 mL QID Provides 149 g of protein, 2500 kcals and 1186 mL of free water   NUTRITION DIAGNOSIS:   Severe Malnutrition related to chronic illness (CAD/CHF/ESRD) as evidenced by severe fat depletion, severe muscle depletion.  Continues   GOAL:   Patient will meet greater than or equal to 90% of their needs  Not Met  MONITOR:   TF tolerance, Diet advancement, Labs, Weight trends, Skin  REASON FOR ASSESSMENT:   Consult Enteral/tube feeding initiation and management  ASSESSMENT:   Patient with PMH significant for DM, HTN, PAD, CHF, CAD, ESRD, cirrhosis, and IBS. Presents this admission for CABG.  5/22- s/p RHC, severe 3vd, high cardiac output 5/26- paracentesis- drained 4L 5/31- s/p CABG 6/01- code, hemorrhagic shock, back to OR for mediastinal exploration, Cortrak placed 6/02- extubated, trickle TF initiated 6/03- TF advanced to goal 6/05- Cortrak pulled per provider  6/07- CRRT discontinued, femoral HD cath removed 6/09- Re-intubated 6/10- Cortrak placed 6/15- Extubated 6/16- Reintubated, Trach placed  Palliative care following, family conference call tomorrow to further discuss [place]  Pt remains on vent support via trach, febrile, remains on CRRT, on levophed. Pt is lethargic  TF on hold at present; apparently overnight possible TF coming from around trach site-thick yellowish tan secretions that appeared and  smelled like TF. Also reported that pills suctioned out of trach. 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   Request for Cortrak tube to be advanced from stomach to post pyloric position; Cortrak team attempted but unsuccessful  Labs: CBGs 129-255, phosphorus 1.6 (L) Meds: reviewed  Diet Order:   Diet Order             Diet NPO time specified  Diet effective now                   EDUCATION NEEDS:   Not appropriate for education at this time  Skin:  Skin Assessment: Skin Integrity Issues: Skin Integrity Issues:: Unstageable DTI: bilateral heels Unstageable: sacrococcygeal Incisions: R leg, chest  Last BM:  6/19  Height:   Ht Readings from Last 1 Encounters:  04/07/21 6' 2" (1.88 m)    Weight:   Wt Readings from Last 1 Encounters:  04/12/21 61.4 kg    Ideal Body Weight:     BMI:  Body mass index is 17.38 kg/m.  Estimated Nutritional Needs:   Kcal:  2200-2500 kcals  Protein:  125-155 grams  Fluid:  >/= 2 L/day  Kerman Passey MS, RDN, LDN, CNSC Registered Dietitian III Clinical Nutrition RD Pager and On-Call Pager Number Located in [place]

## 2021-04-12 NOTE — Progress Notes (Signed)
19 Days Post-Op Procedure(s) (LRB): EXPLORATION POST OPERATIVE OPEN HEART (N/A) Subjective: No complaints  Objective: Vital signs in last 24 hours: Temp:  [97.9 F (36.6 C)-101.2 F (38.4 C)] 98.6 F (37 C) (06/20 0430) Pulse Rate:  [96-107] 97 (06/20 0800) Cardiac Rhythm: Sinus tachycardia (06/20 0400) Resp:  [15-31] 20 (06/20 0800) BP: (73-139)/(44-119) 113/60 (06/20 0800) SpO2:  [94 %-100 %] 100 % (06/20 0800) FiO2 (%):  [40 %] 40 % (06/20 0320) Weight:  [61.4 kg] 61.4 kg (06/20 0500)  Hemodynamic parameters for last 24 hours:    Intake/Output from previous day: 06/19 0701 - 06/20 0700 In: 3420.7 [I.V.:1458.7; NG/GT:1501.7; IV Piggyback:460.3] Out: 3397  Intake/Output this shift: Total I/O In: 87.8 [I.V.:64.2; IV Piggyback:23.7] Out: 140 [Other:140]  General appearance: no distress Neurologic: intact Heart: regular rate and rhythm, S1, S2 normal, no murmur, click, rub or gallop Lungs: diminished breath sounds bibasilar Abdomen: mildy distended Extremities: extremities normal, atraumatic, no cyanosis or edema  Lab Results: Recent Labs    04/12/21 0055 04/12/21 0407  WBC 24.8* 25.6*  HGB 11.2* 10.0*  HCT 36.8* 33.4*  PLT 202 274   BMET:  Recent Labs    04/12/21 0055 04/12/21 0407  NA 134* 137  K 6.2* 4.9  5.0  CL 104 103  CO2 19* 28  GLUCOSE 210* 76  BUN 34* 32*  CREATININE 1.29* 1.27*  CALCIUM 8.2* 8.6*    PT/INR: No results for input(s): LABPROT, INR in the last 72 hours. ABG    Component Value Date/Time   PHART 7.406 04/07/2021 2002   HCO3 26.9 04/07/2021 2002   TCO2 28 04/07/2021 2002   ACIDBASEDEF 10.0 (H) 04/02/2021 0352   O2SAT 100.0 04/07/2021 2002   CBG (last 3)  Recent Labs    04/12/21 0013 04/12/21 0441 04/12/21 0711  GLUCAP 228* 105* 129*    Assessment/Plan: S/P Procedure(s) (LRB): EXPLORATION POST OPERATIVE OPEN HEART (N/A) HD Check KUB for [place] tube position    LOS: 28 days    Wonda Olds 04/12/2021

## 2021-04-12 NOTE — Progress Notes (Signed)
Thick, yellowish tan secretions noted around trach site, appears and smells like tube feed.  Tube feeding stopped.  No secretions noted in mouth.  Suctioned via trach- thick, tan, blood-tinged sputum noted but appearance not consistency of tube feed.  Cortrak in same position with easily audible epigastric gurgle with air bolus.  Elink notified for orders.

## 2021-04-12 NOTE — Progress Notes (Signed)
ANTICOAGULATION CONSULT NOTE  Pharmacy Consult for heparin Indication: atrial flutter  Allergies  Allergen Reactions   Shellfish-Derived Products Anaphylaxis   Wasp Venom Protein Anaphylaxis   Penicillins Other (See Comments)    Childhood reaction    Patient Measurements: Height: '6\' 2"'$  (188 cm) Weight: 61.4 kg (135 lb 5.8 oz) IBW/kg (Calculated) : 82.2 Heparin Dosing Weight: 79kg  Vital Signs: Temp: 98.6 F (37 C) (06/20 0430) Temp Source: Axillary (06/20 0430) BP: 113/60 (06/20 0800) Pulse Rate: 97 (06/20 0800)  Labs: Recent Labs    04/11/21 0111 04/11/21 1619 04/12/21 0055 04/12/21 0407  HGB 10.4*  --  11.2* 10.0*  HCT 34.3*  --  36.8* 33.4*  PLT 266  --  202 274  HEPARINUNFRC 0.39  --  0.29* 0.42  CREATININE 1.35* 1.33* 1.29* 1.27*     Estimated Creatinine Clearance: 57.1 mL/min (A) (by C-G formula based on SCr of 1.27 mg/dL (H)).   Medical History: Past Medical History:  Diagnosis Date   ESRD (end stage renal disease) ([place])    Hypertension    Peripheral arterial disease ([place])    Type 2 diabetes mellitus ([place])    Assessment: 67 YOF with recent CABG now with postop afib/flutter. HIT negative per labs.   Heparin level therapeutic, CBC stable.  Goal of Therapy:  Heparin level 0.3-0.5  units/ml  Monitor platelets by anticoagulation protocol: Yes   Plan:  -Continue heparin 2750 units/hr -Monitor daily heparin level, CBC, and for s/sx of bleeding  [place] Crane, Vena [place], BCPS, BCCP Clinical Pharmacist  04/12/2021 8:22 AM   [facility] pharmacy phone numbers are listed on [email or web address]

## 2021-04-12 NOTE — Procedures (Signed)
Arterial Catheter Insertion Procedure Note  Navion Defrain  [id number]  31-Jan-1966  Date:04/12/21  Time:3:38 PM    Provider Performing: Kipp Brood    Procedure: Insertion of Arterial Line [phone]) with US guidance [id number])   Indication(s) Blood pressure monitoring and/or need for frequent ABGs  Consent Risks of the procedure as well as the alternatives and risks of each were explained to the patient and/or caregiver.  Consent for the procedure was obtained and is signed in the bedside chart  Anesthesia None   Time Out Verified patient identification, verified procedure, site/side was marked, verified correct patient position, special equipment/implants available, medications/allergies/relevant history reviewed, required imaging and test results available.   Sterile Technique Maximal sterile technique including full sterile barrier drape, hand hygiene, sterile gown, sterile gloves, mask, hair covering, sterile ultrasound probe cover (if used).   Procedure Description Area of catheter insertion was cleaned with chlorhexidine and draped in sterile fashion. With real-time ultrasound guidance an arterial catheter was placed into the right femoral artery.  Appropriate arterial tracings confirmed on monitor.     Complications/Tolerance None; patient tolerated the procedure well.   EBL Minimal   Kipp Brood, MD [facility] ICU Physician [place]  Pager: [phone] Or Epic Secure Chat After hours: [phone].  04/12/2021, 3:39 PM

## 2021-04-12 NOTE — Progress Notes (Signed)
Patient unable to tolerate trach collar at this time .  Will try ATC in the AM .

## 2021-04-12 NOTE — Progress Notes (Signed)
SLP Cancellation Note  Patient Details Name: David Rose. MRN: [id number] DOB: [birth year]   Cancelled treatment:       Reason Eval/Treat Not Completed: Medical issues which prohibited therapy. Though pt is alert and on ATC, he is under going work up for aspiration of tube feeds and is not appropriate for PMSV today. Will f/u for readiness.    Mallika Sanmiguel, Katherene Ponto 04/12/2021, 11:21 AM

## 2021-04-13 ENCOUNTER — Inpatient Hospital Stay (HOSPITAL_COMMUNITY): Payer: Medicare Other

## 2021-04-13 DIAGNOSIS — I5023 Acute on chronic systolic (congestive) heart failure: Secondary | ICD-10-CM | POA: Diagnosis not present

## 2021-04-13 DIAGNOSIS — J9621 Acute and chronic respiratory failure with hypoxia: Secondary | ICD-10-CM

## 2021-04-13 DIAGNOSIS — E43 Unspecified severe protein-calorie malnutrition: Secondary | ICD-10-CM | POA: Diagnosis not present

## 2021-04-13 DIAGNOSIS — R570 Cardiogenic shock: Secondary | ICD-10-CM | POA: Diagnosis not present

## 2021-04-13 LAB — POCT I-STAT 7, (LYTES, BLD GAS, ICA,H+H)
Acid-Base Excess: 2 mmol/L (ref 0.0–2.0)
Bicarbonate: 26.4 mmol/L (ref 20.0–28.0)
Calcium, Ion: 1.26 mmol/L (ref 1.15–1.40)
HCT: 29 % — ABNORMAL LOW (ref 39.0–52.0)
Hemoglobin: 9.9 g/dL — ABNORMAL LOW (ref 13.0–17.0)
O2 Saturation: 99 %
Patient temperature: 97
Potassium: 4.6 mmol/L (ref 3.5–5.1)
Sodium: 138 mmol/L (ref 135–145)
TCO2: 28 mmol/L (ref 22–32)
pCO2 arterial: 38.5 mmHg (ref 32.0–48.0)
pH, Arterial: 7.44 (ref 7.350–7.450)
pO2, Arterial: 114 mmHg — ABNORMAL HIGH (ref 83.0–108.0)

## 2021-04-13 LAB — RENAL FUNCTION PANEL
Albumin: 1.8 g/dL — ABNORMAL LOW (ref 3.5–5.0)
Albumin: 1.8 g/dL — ABNORMAL LOW (ref 3.5–5.0)
Anion gap: 7 (ref 5–15)
Anion gap: 7 (ref 5–15)
BUN: 23 mg/dL — ABNORMAL HIGH (ref 6–20)
BUN: 25 mg/dL — ABNORMAL HIGH (ref 6–20)
CO2: 24 mmol/L (ref 22–32)
CO2: 22 mmol/L (ref 22–32)
Calcium: 8.5 mg/dL — ABNORMAL LOW (ref 8.9–10.3)
Calcium: 8.3 mg/dL — ABNORMAL LOW (ref 8.9–10.3)
Chloride: 103 mmol/L (ref 98–111)
Chloride: 106 mmol/L (ref 98–111)
Creatinine, Ser: 1 mg/dL (ref 0.61–1.24)
Creatinine, Ser: 1.17 mg/dL (ref 0.61–1.24)
GFR, Estimated: >60 mL/min (ref >60)
GFR, Estimated: >60 mL/min (ref >60)
Glucose, Bld: 120 mg/dL — ABNORMAL HIGH (ref 70–99)
Glucose, Bld: 190 mg/dL — ABNORMAL HIGH (ref 70–99)
Phosphorus: 2.7 mg/dL (ref 2.5–4.6)
Phosphorus: 2.9 mg/dL (ref 2.5–4.6)
Potassium: 4.7 mmol/L (ref 3.5–5.1)
Potassium: 4.5 mmol/L (ref 3.5–5.1)
Sodium: 134 mmol/L — ABNORMAL LOW (ref 135–145)
Sodium: 135 mmol/L (ref 135–145)

## 2021-04-13 LAB — GLUCOSE, CAPILLARY
Glucose-Capillary: 100 mg/dL — ABNORMAL HIGH (ref 70–99)
Glucose-Capillary: 103 mg/dL — ABNORMAL HIGH (ref 70–99)
Glucose-Capillary: 120 mg/dL — ABNORMAL HIGH (ref 70–99)
Glucose-Capillary: 125 mg/dL — ABNORMAL HIGH (ref 70–99)
Glucose-Capillary: 143 mg/dL — ABNORMAL HIGH (ref 70–99)
Glucose-Capillary: 187 mg/dL — ABNORMAL HIGH (ref 70–99)
Glucose-Capillary: 87 mg/dL (ref 70–99)

## 2021-04-13 LAB — CBC
HCT: 31.6 % — ABNORMAL LOW (ref 39.0–52.0)
HCT: 30.2 % — ABNORMAL LOW (ref 39.0–52.0)
Hemoglobin: 9.6 g/dL — ABNORMAL LOW (ref 13.0–17.0)
Hemoglobin: 9 g/dL — ABNORMAL LOW (ref 13.0–17.0)
MCH: 31.6 pg (ref 26.0–34.0)
MCH: 31.3 pg (ref 26.0–34.0)
MCHC: 30.4 g/dL (ref 30.0–36.0)
MCHC: 29.8 g/dL — ABNORMAL LOW (ref 30.0–36.0)
MCV: 103.9 fL — ABNORMAL HIGH (ref 80.0–100.0)
MCV: 104.9 fL — ABNORMAL HIGH (ref 80.0–100.0)
Platelets: 258 10*3/uL (ref 150–400)
Platelets: 271 10*3/uL (ref 150–400)
RBC: 3.04 MIL/uL — ABNORMAL LOW (ref 4.22–5.81)
RBC: 2.88 MIL/uL — ABNORMAL LOW (ref 4.22–5.81)
RDW: 23.3 % — ABNORMAL HIGH (ref 11.5–15.5)
RDW: 23.3 % — ABNORMAL HIGH (ref 11.5–15.5)
WBC: 27.6 10*3/uL — ABNORMAL HIGH (ref 4.0–10.5)
WBC: 25.3 10*3/uL — ABNORMAL HIGH (ref 4.0–10.5)
nRBC: 1 % — ABNORMAL HIGH (ref 0.0–0.2)
nRBC: 1 % — ABNORMAL HIGH (ref 0.0–0.2)

## 2021-04-13 LAB — CULTURE, RESPIRATORY W GRAM STAIN

## 2021-04-13 LAB — HEPARIN LEVEL (UNFRACTIONATED)
Heparin Unfractionated: 0.65 IU/mL (ref 0.30–0.70)
Heparin Unfractionated: 0.65 IU/mL (ref 0.30–0.70)

## 2021-04-13 LAB — MAGNESIUM: Magnesium: 2.4 mg/dL (ref 1.7–2.4)

## 2021-04-13 MED ORDER — HYDROCORTISONE 20 MG PO TABS
20.0000 mg | ORAL_TABLET | Freq: Every day | ORAL | Status: DC
  Administered 2021-04-14: 20 mg via ORAL
  Filled 2021-04-13 (×2): qty 1

## 2021-04-13 MED ORDER — QUETIAPINE FUMARATE 100 MG PO TABS
100.0000 mg | ORAL_TABLET | Freq: Two times a day (BID) | ORAL | Status: DC
  Administered 2021-04-13 – 2021-04-14 (×3): 100 mg
  Filled 2021-04-13 (×4): qty 1

## 2021-04-13 MED ORDER — WHITE PETROLATUM EX OINT
TOPICAL_OINTMENT | CUTANEOUS | Status: AC
  Filled 2021-04-13: qty 28.35

## 2021-04-13 MED ORDER — IOHEXOL 300 MG/ML  SOLN
50.0000 mL | Freq: Once | INTRAMUSCULAR | Status: AC | PRN
  Administered 2021-04-13: 10 mL

## 2021-04-13 MED ORDER — WHITE PETROLATUM EX OINT: TOPICAL_OINTMENT | CUTANEOUS | Status: DC | PRN

## 2021-04-13 NOTE — Progress Notes (Signed)
EVENING ROUNDS NOTE :     [address]       [place] [address]             [phone]                 20 Days Post-Op Procedure(s) (LRB): EXPLORATION POST OPERATIVE OPEN HEART (N/A)   Total Length of Stay:  LOS: 29 days  Events:   Family has decided to transition to comfort measures tomorrow    BP 106/73   Pulse 98   Temp 98.1 F (36.7 C) (Oral)   Resp (!) 22   Ht '6\' 2"'$  (1.88 m)   Wt 64.5 kg   SpO2 100%   BMI 18.26 kg/m      Vent Mode: PRVC FiO2 (%):  [40 %] 40 % Set Rate:  [20 bmp] 20 bmp Vt Set:  [570 mL] 570 mL PEEP:  [5 cmH20] 5 cmH20 Pressure Support:  [10 cmH20] 10 cmH20 Plateau Pressure:  [18 cmH20-23 cmH20] 23 cmH20    prismasol BGK 4/2.5 500 mL/hr at 04/13/21 0111    prismasol BGK 4/2.5 300 mL/hr at 04/13/21 1123   sodium chloride Stopped (04/10/21 0811)   amiodarone 30 mg/hr (04/13/21 0959)   dexmedetomidine (PRECEDEX) IV infusion 0.4 mcg/kg/hr (04/13/21 1700)   dextrose 5 % and 0.9% NaCl Stopped (04/13/21 1554)   feeding supplement (VITAL 1.5 CAL) 1,000 mL (04/13/21 1613)   heparin 2,750 Units/hr (04/13/21 1700)   lactated ringers     lactated ringers Stopped (04/01/21 1902)   meropenem (MERREM) IV Stopped (04/13/21 1411)   norepinephrine (LEVOPHED) Adult infusion 36 mcg/min (04/13/21 1700)   prismasol BGK 4/2.5 1,500 mL/hr at 04/13/21 1521    I/O last 3 completed shifts: In: 4518.9 [I.V.:2827.3; NG/GT:885; IV Piggyback:806.6] Out: [id number] [Other:3788]   CBC Latest Ref Rng & Units 04/13/2021 04/13/2021 04/12/2021  WBC 4.0 - 10.5 K/uL 25.3(H) 27.6(H) 25.6(H)  Hemoglobin 13.0 - 17.0 g/dL 9.0(L) 9.6(L) 10.0(L)  Hematocrit 39.0 - 52.0 % 30.2(L) 31.6(L) 33.4(L)  Platelets 150 - 400 K/uL 271 258 274    BMP Latest Ref Rng & Units 04/13/2021 04/13/2021 04/12/2021  Glucose 70 - 99 mg/dL 190(H) 120(H) 144(H)  BUN 6 - 20 mg/dL 25(H) 23(H) 30(H)  Creatinine 0.61 - 1.24 mg/dL 1.17 1.00 1.11  Sodium 135 - 145 mmol/L 135 134(L) 134(L)   Potassium 3.5 - 5.1 mmol/L 4.5 4.7 4.5  Chloride 98 - 111 mmol/L 106 103 103  CO2 22 - 32 mmol/L '22 24 24  '$ Calcium 8.9 - 10.3 mg/dL 8.3(L) 8.5(L) 8.3(L)    ABG    Component Value Date/Time   PHART 7.406 04/07/2021 2002   PCO2ART 42.6 04/07/2021 2002   PO2ART 326 (H) 04/07/2021 2002   HCO3 26.9 04/07/2021 2002   TCO2 28 04/07/2021 2002   ACIDBASEDEF 10.0 (H) 04/02/2021 0352   O2SAT 100.0 04/07/2021 2002       Melodie Bouillon, MD 04/13/2021 6:06 PM

## 2021-04-13 NOTE — Progress Notes (Signed)
ANTICOAGULATION CONSULT NOTE  Pharmacy Consult for heparin Indication: atrial flutter  Allergies  Allergen Reactions   Shellfish-Derived Products Anaphylaxis   Wasp Venom Protein Anaphylaxis   Penicillins Other (See Comments)    Childhood reaction    Patient Measurements: Height: '6\' 2"'$  (188 cm) Weight: 64.5 kg (142 lb 3.2 oz) IBW/kg (Calculated) : 82.2 Heparin Dosing Weight: 79kg  Vital Signs: Temp: 98.06 F (36.7 C) (06/21 1300) Temp Source: Rectal (06/21 1200) BP: 107/47 (06/21 1300) Pulse Rate: 101 (06/21 1300)  Labs: Recent Labs    04/12/21 0055 04/12/21 0407 04/12/21 1553 04/13/21 0405  HGB 11.2* 10.0*  --  9.6*  HCT 36.8* 33.4*  --  31.6*  PLT 202 274  --  258  HEPARINUNFRC 0.29* 0.42  --  0.65  CREATININE 1.29* 1.27* 1.11 1.00     Estimated Creatinine Clearance: 76.1 mL/min (by C-G formula based on SCr of 1 mg/dL).   Medical History: Past Medical History:  Diagnosis Date   ESRD (end stage renal disease) ([place])    Hypertension    Peripheral arterial disease ([place])    Type 2 diabetes mellitus ([place])    Assessment: 74 YOF with recent CABG now with postop afib/flutter. HIT negative per labs.   Heparin level therapeutic, Hgb slightly drifting down.  No overt bleeding or complications noted.  Goal of Therapy:  Heparin level 0.3-0.5  units/ml  Monitor platelets by anticoagulation protocol: Yes   Plan:  -Continue heparin 2750 units/hr -Monitor daily heparin level, CBC, and for s/sx of bleeding  Marguerite Olea, BCCP Clinical Pharmacist  04/13/2021 2:46 PM   [facility] pharmacy phone numbers are listed on [email or web address]

## 2021-04-13 NOTE — Progress Notes (Signed)
Patient ID: David Rose., male   DOB: 06-03-1966, 55 y.o.   MRN: [id number]      Progress Note from the Palliative Medicine Team at [facility]   Patient Name: David Rose.        Date: 04/13/2021 DOB: 06-Jan-1966  Age: 55 y.o. MRN#: [id number] Attending Physician: Wonda Olds, MD Primary Care Physician: Ollen Bowl, MD Admit Date: 03/14/2021   Medical records reviewed   55 year old gentleman with history of hypertension diabetes mellitus type 2 end-stage renal disease on chronic hemodialysis.  History of congestive heart failure with systolic dysfunction, history of cirrhosis coronary artery disease status post cardiac arrest status post CABG.  2 episodes of sudden unresponsiveness 04/01/2020 04/07/2021.  Has been evaluated by neurology and underwent EEG monitoring as well as MRI of brain.  He was started on CRRT 04/21/2021 and continues on this mode of therapy.    Complicated [id number] hospitalization.  Unfortunately David Rose is not responding as hoped with  full medical support.    Patient encephalopathic, unable to follow commands.  He remains vent dependant, remains on CVVHD  Family face treatment option decisions, advanced directive decisions and anticipatory care needs.   This NP assessed patient at the bedside and then spoke by phone with family as scheduled as a follow up to  yesterday's GOCs meeting and for continued conversation regarding current medical situation and clarification of goals of care.  On phone conference call were patient's three siblings; David Rose/sister, David Rose/brother and David Rose/sister.  No documented healthcare power of attorney, 3 siblings act in unison for medical decisions for David Rose  Continued conversation regarding current medical situation.    Created space and opportunity for family to explore the thoughts and feelings regarding patient's current medical situation.  Patient's Sister David Rose speaks to the patient's  significant physical decline over the past 4 years, " he was not in good shape before this hospitalization".  Patient lives with this  sister David Rose.  Sister/ David Rose verbalizes that the last time she actually had a coherent conversation with her brother which was somewhere around June 12 he called to tell her that "I am not going to make it out of here,  I am going fishing with Jesus".  She believes he had a sense of knowing that his end-of-life was near.  All three siblings believe it is time to stop life prolonging interventions and allow for a natural death.    The difference between aggressive medical intervention path and a palliative comfort path for this patient at this time and his situation was detailed.   The logistics of a one-way extubation and shift to comfort path was detailed.  They want to be present with their brother when extubation occurs.  They are planning that time for Thursday June 23rd at 9 AM.  Plan of Care; -No compressions, no cardioversion -Continue current medical interventions until family can gather on Thursday morning     At that time -One-way extubation to comfort -Discontinue CRRT -Discontinue IV antibiotics -No further artificial feeding or hydration -Symptom management to enhance comfort and dignity -Allow for natural death, prognosis is likely hours to days after shift to comfort.    Questions and concerns addressed   Discussed with Dr Orvan Seen, Dr Haroldine Laws and Dr Lynetta Mare via secure chat and bedside RN   Total time spent on the unit was 60 minutes  Greater than 50% of the time was spent in counseling and coordination of care  Wadie Lessen NP  Palliative Medicine Team Team Phone # [phone] Pager [phone]

## 2021-04-13 NOTE — Progress Notes (Signed)
   IR is aware of this pt. Was tentatively seen and scheduled for tunneled HD cath 6/16 High wbc and +BC at that time. Was determined best to HOLD for tunneled catheter until wbc trending down and new BC were resulted negative. Wbc still high at 27.6 (still with upward trend). No new BC performed as of yet.  Will continue to hold on HD cath Please call IR if changes- or cancel order if catheter not needed at this point.

## 2021-04-13 NOTE — Progress Notes (Signed)
[facility] ROUNDING NOTE   Subjective:   Interval History: 55 year old gentleman with history of hypertension diabetes mellitus type 2 end-stage renal disease on chronic hemodialysis.  History of congestive heart failure with systolic dysfunction, history of cirrhosis coronary artery disease status post cardiac arrest status post CABG.  2 episodes of sudden unresponsiveness 04/01/2020 04/07/2021.  Has been evaluated by neurology and underwent EEG monitoring as well as MRI of brain.  He was started on CRRT 04/11/2021 and continues on this mode of therapy.  Blood pressure 107/49 pulse 102 temperature 98 O2 sats under percent FiO2 40%  Sodium 134 potassium 4.7 chloride 103 CO2 24 BUN 23 creatinine 1 glucose 120 calcium 8.5 phosphorus 2.7 magnesium 2.4 albumin 1.8 hemoglobin 9.6  Fluid balance kept even anuric renal failure history of end-stage renal disease  Objective:  Vital signs in last 24 hours:  Temp:  [94.28 F (34.6 C)-99.14 F (37.3 C)] 98.6 F (37 C) (06/21 1100) Pulse Rate:  [93-110] 103 (06/21 1100) Resp:  [15-27] 19 (06/21 1100) BP: (80-117)/(35-98) 101/56 (06/21 1100) SpO2:  [100 %] 100 % (06/21 1100) Arterial Line BP: (98-122)/(31-45) 101/38 (06/21 0900) FiO2 (%):  [40 %] 40 % (06/21 0900) Weight:  [64.5 kg] 64.5 kg (06/21 0500)  Weight change: 3.1 kg Filed Weights   04/11/21 0545 04/12/21 0500 04/13/21 0500  Weight: 65 kg 61.4 kg 64.5 kg    Intake/Output: I/O last 3 completed shifts: In: 4518.9 [I.V.:2827.3; NG/GT:885; IV Piggyback:806.6] Out: 3788 [Other:3788]   Intake/Output this shift:  Total I/O In: 401.7 [I.V.:401.7] Out: 409 [Other:409]  Gen: ill appearing, cachectic, looks exhausted NECK: trach in place, c/d/i CVS: irregular, tachycardic Resp: coarse mechanical bilaterally Abd: soft, + Suprapubic and RLQ pain- flinches Ext: sarcopenic, dependent thigh edema improved ACCESS: L nontunneled femoral HD catheter, R UE AVF + T/B NEURO:  grimaces to pain, R pupil 4 mm sluggish, L pupil 6 mm nonreactive   Basic Metabolic Panel: Recent Labs  Lab 04/10/21 0037 04/10/21 1758 04/11/21 0111 04/11/21 1619 04/12/21 0055 04/12/21 0407 04/12/21 1553 04/13/21 0405  NA 136   < > 135 135 134* 137 134* 134*  K 4.5   < > 4.8 5.0 6.2* 4.9  5.0 4.5 4.7  CL 102   < > 102 103 104 103 103 103  CO2 27   < > 26 24 19* _0 GLUCOSE 127*   < > 263* 131* 210* 76 144* 120*  BUN 30*   < > 29* 32* 34* 32* 30* 23*  CREATININE 1.16   < > 1.35* 1.33* 1.29* 1.27* 1.11 1.00  CALCIUM 8.4*   < > 8.3* 8.2* 8.2* 8.6* 8.3* 8.5*  MG 2.5*  --  2.5*  --  2.4 2.4  --  2.4  PHOS 1.9*   < > 1.5* 2.1* 2.2* 1.6* 3.6 2.7   < > = values in this interval not displayed.     Liver Function Tests: Recent Labs  Lab 04/07/21 1924 04/08/21 0427 04/11/21 1619 04/12/21 0055 04/12/21 0407 04/12/21 1553 04/13/21 0405  AST 41  --   --   --   --   --   --   ALT 21  --   --   --   --   --   --   ALKPHOS 202*  --   --   --   --   --   --   BILITOT 2.1*  --   --   --   --   --   --  PROT 7.0  --   --   --   --   --   --   ALBUMIN 2.3*   < > 2.0* 2.2* 2.0* 1.9* 1.8*   < > = values in this interval not displayed.    No results for input(s): LIPASE, AMYLASE in the last 168 hours. Recent Labs  Lab 04/07/21 2138 04/12/21 1553  AMMONIA 18 43*     CBC: Recent Labs  Lab 04/07/21 1924 04/07/21 2002 04/10/21 0037 04/11/21 0111 04/12/21 0055 04/12/21 0407 04/13/21 0405  WBC 29.5*   < > 28.0* 29.0* 24.8* 25.6* 27.6*  NEUTROABS 28.0*  --   --   --   --   --   --   HGB 11.7*   < > 9.7* 10.4* 11.2* 10.0* 9.6*  HCT 36.8*   < > 31.4* 34.3* 36.8* 33.4* 31.6*  MCV 97.6   < > 100.6* 101.8* 104.0* 104.4* 103.9*  PLT 215   < > 208 266 202 274 258   < > = values in this interval not displayed.     Cardiac Enzymes: No results for input(s): CKTOTAL, CKMB, CKMBINDEX, TROPONINI in the last 168 hours.  BNP: Invalid input(s): POCBNP  CBG: Recent Labs   Lab 04/12/21 1941 04/12/21 2027 04/13/21 0002 04/13/21 0320 04/13/21 0741  GLUCAP 29* 134* 100* 125* 36     Microbiology: Results for orders placed or performed during the hospital encounter of 03/19/2021  Surgical PCR screen     Status: None   Collection Time: 03/14/2021  2:37 PM   Specimen: Nasopharyngeal Swab; Nasal Swab  Result Value Ref Range Status   MRSA, PCR NEGATIVE NEGATIVE Final   Staphylococcus aureus NEGATIVE NEGATIVE Final    Comment: (NOTE) The Xpert SA Assay (FDA approved for NASAL specimens in patients 69 years of age and older), is one component of a comprehensive surveillance program. It is not intended to diagnose infection nor to guide or monitor treatment. Performed at [facility], [place] [address] [place], [place] [address]   SARS CORONAVIRUS 2 (TAT 6-24 HRS) Nasopharyngeal Nasopharyngeal Swab     Status: None   Collection Time: 03/16/2021  3:12 PM   Specimen: Nasopharyngeal Swab  Result Value Ref Range Status   SARS Coronavirus 2 NEGATIVE NEGATIVE Final    Comment: (NOTE) SARS-CoV-2 target nucleic acids are NOT DETECTED.  The SARS-CoV-2 RNA is generally detectable in upper and lower respiratory specimens during the acute phase of infection. Negative results do not preclude SARS-CoV-2 infection, do not rule out co-infections with other pathogens, and should not be used as the sole basis for treatment or other patient management decisions. Negative results must be combined with clinical observations, patient history, and epidemiological information. The expected result is Negative.  Fact Sheet for Patients: SugarRoll.be  Fact Sheet for Healthcare Providers: https://www.woods-mathews.com/  This test is not yet approved or cleared by the [place] FDA and  has been authorized for detection and/or diagnosis of SARS-CoV-2 by FDA under an Emergency Use Authorization (EUA). This EUA will remain  in  effect (meaning this test can be used) for the duration of the COVID-19 declaration under Se ction 564(b)(1) of the Act, 21 U.S.C. section 360bbb-3(b)(1), unless the authorization is terminated or revoked sooner.  Performed at [facility], [place] [address], [place], [place] [address]   Surgical pcr screen     Status: None   Collection Time: 03/08/2021  2:43 AM   Specimen: Nasal Mucosa; Nasal Swab  Result Value Ref Range Status   MRSA, PCR NEGATIVE NEGATIVE Final   Staphylococcus aureus NEGATIVE NEGATIVE Final    Comment: (NOTE) The Xpert SA Assay (FDA approved for NASAL specimens in patients 25 years of age and older), is one component of a comprehensive surveillance program. It is not intended to diagnose infection nor to guide or monitor treatment. Performed at [facility], [place] [address] [place], [place] [address]   MRSA PCR Screening     Status: None   Collection Time: 03/28/21  1:18 PM   Specimen: Nasopharyngeal  Result Value Ref Range Status   MRSA by PCR NEGATIVE NEGATIVE Final    Comment:        The GeneXpert MRSA Assay (FDA approved for NASAL specimens only), is one component of a comprehensive MRSA colonization surveillance program. It is not intended to diagnose MRSA infection nor to guide or monitor treatment for MRSA infections. Performed at [facility], [place] [address], [place], [place] [address]   Culture, Urine     Status: None   Collection Time: 04/02/21  8:17 AM   Specimen: Urine, Random  Result Value Ref Range Status   Specimen Description URINE, RANDOM  Final   Special Requests NONE  Final   Culture   Final    NO GROWTH Performed at [facility], [place] [address], [place], [place] [address]    Report Status 04/03/2021 FINAL  Final  Culture, blood (routine x 2)     Status: Abnormal   Collection Time: 04/02/21 11:44 AM   Specimen: BLOOD LEFT HAND  Result Value Ref Range Status   Specimen Description BLOOD LEFT HAND  Final    Special Requests   Final    BOTTLES DRAWN AEROBIC ONLY Blood Culture results may not be optimal due to an inadequate volume of blood received in culture bottles   Culture  Setup Time   Final    GRAM POSITIVE COCCI IN CLUSTERS AEROBIC BOTTLE ONLY CRITICAL RESULT CALLED TO, READ BACK BY AND VERIFIED WITH: PHARMD LAURA SEAY BY MESSAN H. AT 0225 ON 04/05/2021    Culture (A)  Final    STAPHYLOCOCCUS EPIDERMIDIS THE SIGNIFICANCE OF ISOLATING THIS ORGANISM FROM A SINGLE VENIPUNCTURE CANNOT BE PREDICTED WITHOUT FURTHER CLINICAL AND CULTURE CORRELATION. SUSCEPTIBILITIES AVAILABLE ONLY ON REQUEST. Performed at [facility], [place] [address], [place], [place] [address]    Report Status 04/06/2021 FINAL  Final  Blood Culture ID Panel (Reflexed)     Status: Abnormal   Collection Time: 04/02/21 11:44 AM  Result Value Ref Range Status   Enterococcus faecalis NOT DETECTED NOT DETECTED Final   Enterococcus Faecium NOT DETECTED NOT DETECTED Final   Listeria monocytogenes NOT DETECTED NOT DETECTED Final   Staphylococcus species DETECTED (A) NOT DETECTED Final    Comment: CRITICAL RESULT CALLED TO, READ BACK BY AND VERIFIED WITH: PHARMD LAURA SEAY BY MESSAN H. AT 0225 ON 04/05/2021    Staphylococcus aureus (BCID) NOT DETECTED NOT DETECTED Final   Staphylococcus epidermidis DETECTED (A) NOT DETECTED Final    Comment: Methicillin (oxacillin) resistant coagulase negative staphylococcus. Possible blood culture contaminant (unless isolated from more than one blood culture draw or clinical case suggests pathogenicity). No antibiotic treatment is indicated for blood  culture contaminants. CRITICAL RESULT CALLED TO, READ BACK BY AND VERIFIED WITH: PHARMD LAURA SEAY BY MESSAN H. AT 0225 ON 04/05/2021    Staphylococcus lugdunensis NOT DETECTED NOT DETECTED Final   Streptococcus species NOT DETECTED  NOT DETECTED Final   Streptococcus agalactiae NOT DETECTED NOT DETECTED Final   Streptococcus pneumoniae NOT  DETECTED NOT DETECTED Final   Streptococcus pyogenes NOT DETECTED NOT DETECTED Final   A.calcoaceticus-baumannii NOT DETECTED NOT DETECTED Final   Bacteroides fragilis NOT DETECTED NOT DETECTED Final   Enterobacterales NOT DETECTED NOT DETECTED Final   Enterobacter cloacae complex NOT DETECTED NOT DETECTED Final   Escherichia coli NOT DETECTED NOT DETECTED Final   Klebsiella aerogenes NOT DETECTED NOT DETECTED Final   Klebsiella oxytoca NOT DETECTED NOT DETECTED Final   Klebsiella pneumoniae NOT DETECTED NOT DETECTED Final   Proteus species NOT DETECTED NOT DETECTED Final   Salmonella species NOT DETECTED NOT DETECTED Final   Serratia marcescens NOT DETECTED NOT DETECTED Final   Haemophilus influenzae NOT DETECTED NOT DETECTED Final   Neisseria meningitidis NOT DETECTED NOT DETECTED Final   Pseudomonas aeruginosa NOT DETECTED NOT DETECTED Final   Stenotrophomonas maltophilia NOT DETECTED NOT DETECTED Final   Candida albicans NOT DETECTED NOT DETECTED Final   Candida auris NOT DETECTED NOT DETECTED Final   Candida glabrata NOT DETECTED NOT DETECTED Final   Candida krusei NOT DETECTED NOT DETECTED Final   Candida parapsilosis NOT DETECTED NOT DETECTED Final   Candida tropicalis NOT DETECTED NOT DETECTED Final   Cryptococcus neoformans/gattii NOT DETECTED NOT DETECTED Final   Methicillin resistance mecA/C DETECTED (A) NOT DETECTED Final    Comment: CRITICAL RESULT CALLED TO, READ BACK BY AND VERIFIED WITH: PHARMD LAURA SEAY BY MESSAN H. AT 0225 ON 04/05/2021 Performed at [facility], [address], [place], [place] [address]   Expectorated Sputum Assessment w Gram Stain, Rflx to Resp Cult     Status: None   Collection Time: 04/02/21  3:19 PM   Specimen: Expectorated Sputum  Result Value Ref Range Status   Specimen Description EXPECTORATED SPUTUM  Final   Special Requests NONE  Final   Sputum evaluation   Final    THIS SPECIMEN IS ACCEPTABLE FOR SPUTUM CULTURE Performed at  [facility], [place] [address], [place], [place] [address]    Report Status 04/03/2021 FINAL  Final  Culture, Respiratory w Gram Stain     Status: None   Collection Time: 04/02/21  3:19 PM  Result Value Ref Range Status   Specimen Description EXPECTORATED SPUTUM  Final   Special Requests NONE Reflexed from [id number]  Final   Gram Stain   Final    MODERATE WBC PRESENT,BOTH PMN AND MONONUCLEAR ABUNDANT YEAST    Culture   Final    ABUNDANT Normal respiratory flora-no Staph aureus or Pseudomonas seen Performed at [facility], [place] [address] [place], [place] [address]    Report Status 04/05/2021 FINAL  Final  Culture, Respiratory w Gram Stain     Status: None   Collection Time: 04/10/21 10:46 AM   Specimen: Tracheal Aspirate; Respiratory  Result Value Ref Range Status   Specimen Description TRACHEAL ASPIRATE  Final   Special Requests NONE  Final   Gram Stain   Final    ABUNDANT WBC PRESENT,BOTH PMN AND MONONUCLEAR FEW GRAM POSITIVE COCCI RARE GRAM VARIABLE ROD Performed at [facility], [place] [address] [place],  [address]    Culture ABUNDANT STAPHYLOCOCCUS EPIDERMIDIS  Final   Report Status 04/13/2021 FINAL  Final   Organism ID, Bacteria STAPHYLOCOCCUS EPIDERMIDIS  Final      Susceptibility   Staphylococcus epidermidis - MIC*    CIPROFLOXACIN >=8 RESISTANT Resistant  ERYTHROMYCIN >=8 RESISTANT Resistant     GENTAMICIN <=0.5 SENSITIVE Sensitive     OXACILLIN >=4 RESISTANT Resistant     TETRACYCLINE 2 SENSITIVE Sensitive     VANCOMYCIN <=0.5 SENSITIVE Sensitive     TRIMETH/SULFA 80 RESISTANT Resistant     CLINDAMYCIN >=8 RESISTANT Resistant     RIFAMPIN <=0.5 SENSITIVE Sensitive     Inducible Clindamycin NEGATIVE Sensitive     * ABUNDANT STAPHYLOCOCCUS EPIDERMIDIS    Coagulation Studies: No results for input(s): LABPROT, INR in the last 72 hours.  Urinalysis: No results for input(s): COLORURINE, LABSPEC, PHURINE, GLUCOSEU, HGBUR, BILIRUBINUR,  KETONESUR, PROTEINUR, UROBILINOGEN, NITRITE, LEUKOCYTESUR in the last 72 hours.  Invalid input(s): APPERANCEUR    Imaging: US Abdomen Complete  Result Date: 04/11/2021 CLINICAL DATA:  Abdominal pain EXAM: ABDOMEN ULTRASOUND COMPLETE COMPARISON:  Ultrasound abdomen 04/03/2021. CT abdomen pelvis 04/02/2021 FINDINGS: Gallbladder: Gallbladder sludge. 8 mm gallstone. Negative sonographic Murphy sign. Gallbladder wall mildly thickened 4.5 mm. Negative for pericholecystic fluid. Common bile duct: Diameter: 4.0 mm Liver: Cirrhosis of the liver. Coarse echotexture without focal liver lesion. Portal vein is patent on color Doppler imaging with normal direction of blood flow towards the liver. IVC: No abnormality visualized. Pancreas: Visualized portion unremarkable. Spleen: Size and appearance within normal limits. Right Kidney: Length: 10.0 cm. Echogenicity within normal limits. No mass or hydronephrosis visualized. Left Kidney: Not visualized due to bowel gas. Abdominal aorta: Atherosclerotic aorta without aneurysm. Other findings: Limited study due to inability of the patient to hold breath and hold still. Small ascites.  Right pleural effusion noted. IMPRESSION: Gallbladder sludge with gallstone. Mild gallbladder wall thickening. Negative sonographic Murphy sign. Cirrhosis liver with mild ascites Right pleural effusion Electronically Signed   By: Franchot Gallo M.D.   On: 04/11/2021 16:17   DG CHEST PORT 1 VIEW  Result Date: 04/13/2021 CLINICAL DATA:  Tracheostomy. Respiratory failure. Recent open heart surgery. EXAM: PORTABLE CHEST 1 VIEW COMPARISON:  Abdomen 04/12/2021.  Chest x-ray 04/12/2021 FINDINGS: Tracheostomy tube in stable position. Dobbhoff tube noted with tip coiled over the upper stomach. Prior CABG. Stable cardiomegaly. Persistent bilateral pulmonary infiltrates/edema. Small left pleural effusion again noted. No pneumothorax. IMPRESSION: 1. Tracheostomy tube stable position. Dobbhoff tube again  noted with tip coiled in the upper stomach. 2.  Prior CABG.  Stable cardiomegaly. 3. Diffuse bilateral pulmonary infiltrates/edema small left pleural effusion again noted. Findings suggest CHF. Electronically Signed   By: Marcello Moores  Register   On: 04/13/2021 06:56   DG Chest Port 1 View  Result Date: 04/12/2021 CLINICAL DATA:  Pneumonia EXAM: PORTABLE CHEST 1 VIEW COMPARISON:  04/10/2021 FINDINGS: Increased opacity in the lower left lung is unchanged. Right lung remains clear. Tracheostomy tube and esophageal catheter are unchanged. IMPRESSION: Unchanged left lower lung opacity. Electronically Signed   By: Ulyses Jarred M.D.   On: 04/12/2021 03:58   DG Abd Portable 1V  Result Date: 04/12/2021 CLINICAL DATA:  Check feeding catheter placement EXAM: PORTABLE ABDOMEN - 1 VIEW COMPARISON:  Film from earlier in the same day. FINDINGS: Feeding catheter is noted in the gastric fundus stable in appearance from the prior exam. IMPRESSION: Stable appearance of feeding catheter within the gastric fundus. Electronically Signed   By: Inez Catalina M.D.   On: 04/12/2021 17:14   DG Abd Portable 1V  Result Date: 04/12/2021 CLINICAL DATA:  NG tube repositioning EXAM: PORTABLE ABDOMEN - 1 VIEW COMPARISON:  Same day radiograph FINDINGS: The nasoenteric feeding catheter tip is looped with tip overlying the gastric fundus in  the left upper abdomen. There is gaseous distension of bowel in the upper abdomen. Persistent left lower lung opacity. Prior median sternotomy. No acute osseous abnormality. IMPRESSION: Nasoenteric feeding catheter tip is looped with tip overlying the gastric fundus in the left upper abdomen, and may have been partially retracted since the prior exam. Suggest repositioning. Electronically Signed   By: Maurine Simmering   On: 04/12/2021 14:11   DG Abd Portable 1V  Result Date: 04/12/2021 CLINICAL DATA:  NG tube placement. EXAM: PORTABLE ABDOMEN - 1 VIEW COMPARISON:  04/04/2021 FINDINGS: 0911 hours. The tip of  the feeding tube is in the antro pyloric region of the stomach, directed distally towards the duodenum. There is some gaseous distention of colon without evidence of overt small bowel obstruction. Left femoral vascular catheter tip overlies the left sacrum. IMPRESSION: Feeding tube tip is at or near the pylorus directed distally towards the duodenum. Electronically Signed   By: Misty Stanley M.D.   On: 04/12/2021 10:57     Medications:     prismasol BGK 4/2.5 500 mL/hr at 04/13/21 0111    prismasol BGK 4/2.5 300 mL/hr at 04/13/21 0926   sodium chloride Stopped (04/10/21 0811)   amiodarone 30 mg/hr (04/13/21 0959)   dexmedetomidine (PRECEDEX) IV infusion 0.7 mcg/kg/hr (04/13/21 1100)   dextrose 5 % and 0.9% NaCl 25 mL/hr at 04/13/21 1100   feeding supplement (VITAL 1.5 CAL) Stopped (04/12/21 0145)   heparin 2,750 Units/hr (04/13/21 1100)   lactated ringers     lactated ringers Stopped (04/01/21 1902)   meropenem (MERREM) IV Stopped (04/13/21 6803)   norepinephrine (LEVOPHED) Adult infusion 36 mcg/min (04/13/21 1100)   prismasol BGK 4/2.5 1,500 mL/hr at 04/13/21 0855    aspirin  81 mg Per Tube Daily   atorvastatin  40 mg Per Tube Daily   bisacodyl  10 mg Oral Daily   Or   bisacodyl  10 mg Rectal Daily   chlorhexidine gluconate (MEDLINE KIT)  15 mL Mouth Rinse BID   Chlorhexidine Gluconate Cloth  6 each Topical Q0600   clonazePAM  0.5 mg Per Tube BID   collagenase   Topical Daily   darbepoetin (ARANESP) injection - NON-DIALYSIS  60 mcg Subcutaneous Q Thu-1800   docusate  100 mg Per Tube BID   feeding supplement (PROSource TF)  45 mL Per Tube QID   [START ON 04/14/2021] hydrocortisone  20 mg Oral Daily   insulin aspart  0-24 Units Subcutaneous Q4H   ipratropium-albuterol  3 mL Nebulization TID   mouth rinse  15 mL Mouth Rinse 10 times per day   midodrine  30 mg Per Tube TID WC   multivitamin  1 tablet Per Tube QHS   nutrition supplement (JUVEN)  1 packet Per Tube BID BM    pantoprazole sodium  40 mg Per Tube BID   polyethylene glycol  17 g Per Tube Daily   QUEtiapine  100 mg Per Tube BID   sodium chloride flush  10-40 mL Intracatheter Q12H   sodium chloride flush  3 mL Intravenous Q12H   heparin, lactated ringers, lidocaine (PF), lidocaine HCl (PF), lip balm, metoprolol tartrate, ondansetron (ZOFRAN) IV, oxyCODONE, polyvinyl alcohol, sodium chloride, sodium chloride flush  Assessment/ Plan:  End-stage renal disease.Tuesday Thursday Saturday dialysis [place].  Has been on CRRT 04/06/2021.  Being kept even. Acute encephalopathy periods of unresponsiveness appreciate assistance from neurology Septic shock broad-spectrum antibiotics as per primary care service CAD status post CABG x4 complicated by PEA arrest Acute on  chronic congestive heart failure repeat 2D echo 45% Pulmonary hypertension per heart failure team Hyperkalemia resolved with CRRT Hypophosphatemia continue to replete Atrial fibrillation/flutter on systemic heparin    LOS: [place] _0 _1 :21 AM

## 2021-04-13 NOTE — Progress Notes (Signed)
Brief  Nutrition Follow-up:  Pt remains on vent support via trach, on CRRT. TF remains on hold.   Cortrak tube advanced today by Radiology today post pyloric position  Plan to resume Tube Feeding via Cortrak:  Vital 1.5 at 65 ml/hr Begin TF at 20 ml/hr; titrate by 10 mL q 4 hours until goal rate Pro-Source TF 45 mL QID Provides 149 g of protein, 2500 kcals and 1186 mL of free water   Kerman Passey MS, RDN, LDN, CNSC Registered Dietitian III Clinical Nutrition RD Pager and On-Call Pager Number Located in [place]

## 2021-04-13 NOTE — Progress Notes (Signed)
[facility] Progress Note Patient Name: David Rose. DOB: 18-Jun-1966 MRN: [id number]   Date of Service  04/13/2021  HPI/Events of Note  ABG = 7.44/38.5/114/26.4  eICU Interventions  Continue present management.     Intervention Category Major Interventions: Respiratory failure - evaluation and management  Lysle Dingwall 04/13/2021, 11:36 PM

## 2021-04-13 NOTE — Progress Notes (Addendum)
Patient ID: David Keena., male   DOB: 11/26/1965, 55 y.o.   MRN: [id number]      Advanced Heart Failure Rounding Note  PCP-Cardiologist: Carlyle Dolly, MD    Patient Profile   55 y/o male w/  history of ESRD due to DM and CHF with mid-range EF (LV EF 40-45% with moderately decreased RV systolic function and PASP 99 on 4/22 echo) as well as cirrhosis of uncertain etiology.  Based on low EF and elevated PA pressure, right and left heart cath was done in 5/22.  This showed severe 3VD, high cardiac output with moderate pulmonary hypertension but low PVR.  He is planned for CABG, but admitted pre-op for optimization.    Subjective:    Events  - 5/31 CABG x 4 with LIMA-LAD, SVG-RCA, SVG-OM, SVG-D.  - 5/31  Coagulopathic post-op with multiple blood products.  Extubated post-op but developed respiratory distress => PEA arrest then VT. Reintubated.   - 6/1 Returned to OR for mediastinal exploration - 6/2  Extubated - 6/9 AMS--> CT negative for acute bleed or ischemic CVA. Intubated. Suspected septic shock. CVVH restarted. - 6/15: Extubated but later in the day had sudden onset of unresponsiveness re-intubated. EEG negative. NH3 18.  - 6/16 trach  - 6/16 abx stopped Finished Flagyl/Eraxis/cefepime for HCAP/septic shock.  - 6/18 meropenum started - 6/19 EEG - mild diffuse encephalopathy. No seizures.  - 6/20 Suctioning pills from trach. Unable to advance cortrak.  -Remain on vent via trach 40%.   -AFebrile. Remains on Meropenum. WBC trending up 25>28. CXR LLL opacity, unchanged.   -Remains on CVVHD.Running even.   -Norepi increased to 33 mcg and on midodrine 30 tid.     Echo (6/9) with EF 45-50%, inferolateral severe hypokinesis, moderate RV dysfunction.    Objective:   Weight Range: 64.5 kg Body mass index is 18.26 kg/m.   Vital Signs:   Temp:  [94.28 F (34.6 C)-98.96 F (37.2 C)] 98.96 F (37.2 C) (06/21 0900) Pulse Rate:  [93-104] 102 (06/21 0900) Resp:  [15-27] 18  (06/21 0900) BP: (53-117)/(35-98) 102/35 (06/21 0900) SpO2:  [96 %-100 %] 100 % (06/21 0900) Arterial Line BP: (98-122)/(31-45) 101/38 (06/21 0900) FiO2 (%):  [40 %] 40 % (06/21 0325) Weight:  [64.5 kg] 64.5 kg (06/21 0500) Last BM Date: 04/10/21  Weight change: Filed Weights   04/11/21 0545 04/12/21 0500 04/13/21 0500  Weight: 65 kg 61.4 kg 64.5 kg    Intake/Output:   Intake/Output Summary (Last 24 hours) at 04/13/2021 0910 Last data filed at 04/13/2021 0900 Gross per 24 hour  Intake 2698.07 ml  Output 1876 ml  Net 822.07 ml      Physical Exam  General:  Vent/trach  HEENT: normal Neck: Trach. JVP difficult to assess. Carotids 2+ bilat; no bruits. No lymphadenopathy or thryomegaly appreciated. Cor: PMI nondisplaced. Tachy Regular rate & rhythm. No rubs, gallops or murmurs. Lungs: clear Abdomen: soft, nontender, nondistended. No hepatosplenomegaly. No bruits or masses. Good bowel sounds. Extremities: no cyanosis, clubbing, rash, edema. RUE AVF . R femoral a line. LFV HD catheter.   Neuro: Sedated  Telemetry  Sinus Tach 100s    Labs    CBC Recent Labs    04/12/21 0407 04/13/21 0405  WBC 25.6* 27.6*  HGB 10.0* 9.6*  HCT 33.4* 31.6*  MCV 104.4* 103.9*  PLT 274 882   Basic Metabolic Panel Recent Labs    04/12/21 0407 04/12/21 1553 04/13/21 0405  NA 137 134* 134*  K 4.9  5.0 4.5 4.7  CL 103 103 103  CO2 '28 24 24  ' GLUCOSE 76 144* 120*  BUN 32* 30* 23*  CREATININE 1.27* 1.11 1.00  CALCIUM 8.6* 8.3* 8.5*  MG 2.4  --  2.4  PHOS 1.6* 3.6 2.7   Liver Function Tests Recent Labs    04/12/21 1553 04/13/21 0405  ALBUMIN 1.9* 1.8*   No results for input(s): LIPASE, AMYLASE in the last 72 hours. Cardiac Enzymes No results for input(s): CKTOTAL, CKMB, CKMBINDEX, TROPONINI in the last 72 hours.  BNP: BNP (last 3 results) Recent Labs    02/25/2021 1545  BNP 1,127.8*    ProBNP (last 3 results) No results for input(s): PROBNP in the last 8760  hours.   D-Dimer No results for input(s): DDIMER in the last 72 hours.  Hemoglobin A1C No results for input(s): HGBA1C in the last 72 hours. Fasting Lipid Panel No results for input(s): CHOL, HDL, LDLCALC, TRIG, CHOLHDL, LDLDIRECT in the last 72 hours. Thyroid Function Tests No results for input(s): TSH, T4TOTAL, T3FREE, THYROIDAB in the last 72 hours.  Invalid input(s): FREET3   Other results:   Imaging    DG CHEST PORT 1 VIEW  Result Date: 04/13/2021 CLINICAL DATA:  Tracheostomy. Respiratory failure. Recent open heart surgery. EXAM: PORTABLE CHEST 1 VIEW COMPARISON:  Abdomen 04/12/2021.  Chest x-ray 04/12/2021 FINDINGS: Tracheostomy tube in stable position. Dobbhoff tube noted with tip coiled over the upper stomach. Prior CABG. Stable cardiomegaly. Persistent bilateral pulmonary infiltrates/edema. Small left pleural effusion again noted. No pneumothorax. IMPRESSION: 1. Tracheostomy tube stable position. Dobbhoff tube again noted with tip coiled in the upper stomach. 2.  Prior CABG.  Stable cardiomegaly. 3. Diffuse bilateral pulmonary infiltrates/edema small left pleural effusion again noted. Findings suggest CHF. Electronically Signed   By: Marcello Moores  Register   On: 04/13/2021 06:56   DG Abd Portable 1V  Result Date: 04/12/2021 CLINICAL DATA:  Check feeding catheter placement EXAM: PORTABLE ABDOMEN - 1 VIEW COMPARISON:  Film from earlier in the same day. FINDINGS: Feeding catheter is noted in the gastric fundus stable in appearance from the prior exam. IMPRESSION: Stable appearance of feeding catheter within the gastric fundus. Electronically Signed   By: Inez Catalina M.D.   On: 04/12/2021 17:14   DG Abd Portable 1V  Result Date: 04/12/2021 CLINICAL DATA:  NG tube repositioning EXAM: PORTABLE ABDOMEN - 1 VIEW COMPARISON:  Same day radiograph FINDINGS: The nasoenteric feeding catheter tip is looped with tip overlying the gastric fundus in the left upper abdomen. There is gaseous  distension of bowel in the upper abdomen. Persistent left lower lung opacity. Prior median sternotomy. No acute osseous abnormality. IMPRESSION: Nasoenteric feeding catheter tip is looped with tip overlying the gastric fundus in the left upper abdomen, and may have been partially retracted since the prior exam. Suggest repositioning. Electronically Signed   By: Maurine Simmering   On: 04/12/2021 14:11   DG Abd Portable 1V  Result Date: 04/12/2021 CLINICAL DATA:  NG tube placement. EXAM: PORTABLE ABDOMEN - 1 VIEW COMPARISON:  04/04/2021 FINDINGS: 0911 hours. The tip of the feeding tube is in the antro pyloric region of the stomach, directed distally towards the duodenum. There is some gaseous distention of colon without evidence of overt small bowel obstruction. Left femoral vascular catheter tip overlies the left sacrum. IMPRESSION: Feeding tube tip is at or near the pylorus directed distally towards the duodenum. Electronically Signed   By: Misty Stanley M.D.   On: 04/12/2021 10:57  Medications:     Scheduled Medications:  aspirin  81 mg Per Tube Daily   atorvastatin  40 mg Per Tube Daily   bisacodyl  10 mg Oral Daily   Or   bisacodyl  10 mg Rectal Daily   chlorhexidine gluconate (MEDLINE KIT)  15 mL Mouth Rinse BID   Chlorhexidine Gluconate Cloth  6 each Topical Q0600   clonazePAM  0.5 mg Per Tube BID   collagenase   Topical Daily   darbepoetin (ARANESP) injection - NON-DIALYSIS  60 mcg Subcutaneous Q Thu-1800   docusate  100 mg Per Tube BID   feeding supplement (PROSource TF)  45 mL Per Tube QID   hydrocortisone  5 mg Oral Daily   insulin aspart  0-24 Units Subcutaneous Q4H   ipratropium-albuterol  3 mL Nebulization TID   mouth rinse  15 mL Mouth Rinse 10 times per day   midodrine  30 mg Per Tube TID WC   multivitamin  1 tablet Per Tube QHS   nutrition supplement (JUVEN)  1 packet Per Tube BID BM   pantoprazole sodium  40 mg Per Tube BID   polyethylene glycol  17 g Per Tube Daily    QUEtiapine  50 mg Per Tube BID   sodium chloride flush  10-40 mL Intracatheter Q12H   sodium chloride flush  3 mL Intravenous Q12H    Infusions:   prismasol BGK 4/2.5 500 mL/hr at 04/13/21 0111    prismasol BGK 4/2.5 300 mL/hr at 04/11/21 2256   sodium chloride Stopped (04/10/21 0811)   amiodarone     dexmedetomidine (PRECEDEX) IV infusion 1 mcg/kg/hr (04/13/21 0900)   dextrose 5 % and 0.9% NaCl 25 mL/hr at 04/13/21 0900   feeding supplement (VITAL 1.5 CAL) Stopped (04/12/21 0145)   heparin 2,750 Units/hr (04/13/21 0900)   lactated ringers     lactated ringers Stopped (04/01/21 1902)   meropenem (MERREM) IV Stopped (04/13/21 0605)   norepinephrine (LEVOPHED) Adult infusion 33 mcg/min (04/13/21 0900)   prismasol BGK 4/2.5 1,500 mL/hr at 04/13/21 0855    PRN Medications: heparin, lactated ringers, lidocaine (PF), lidocaine HCl (PF), lip balm, metoprolol tartrate, ondansetron (ZOFRAN) IV, oxyCODONE, polyvinyl alcohol, sodium chloride, sodium chloride flush     Assessment/Plan   1. CAD: Severe 3VD with decreased EF.  Films reviewed with Dr. Ellyn Hack, PCI would be possible but would be difficult/high risk with multiple lesions and heavy calcification. 5/31 LIMA-LAD, SVG-RCA, SVG-OM, SVG-D. No s/s ischemic currently  - ASA 81 + atorvastatin 40 mg daily.  2. Acute/chronic HF with mid range EF => cardiogenic shock post-CABG: Suspect ischemic cardiomyopathy.  Echo 4/22 with EF 40-45% with moderately decreased RV systolic function and PASP 99, moderate TR. There was a prominent component of RV failure with RA pressure elevated out of proportion to PCWP (CVP/PCWP 0.875 on RHC) but PAPI adequate at 2.8. Post-op had PEA arrest then VT.  On 6/9, episode of altered mental status, progressive hypotension, and intubated.  Suspect septic shock, now improving.  Echo (6/9) with EF 45-50%, inferolateral severe hypokinesis, moderate RV dysfunction.  - NE 33 mcg . Continue to wean pressors as able.  -  Suspect recurrent sepsis. Meropenum started 6/18 - CVVH ongoing. Keeping even - Continue midodrine 30 mg tid.  3. ESRD: Suspected due to diabetes.  Volume up post-op with multiple blood products. Now back to baseline.  - CVVH ongoing, keeping even. Appreciate Nephrology input - Needs tunneled HD catheter upper chest but need to wait until infection  clears - Renal following  4. Acute respiratory failure - s/p trach 6/16 on Vent.  -  CCM managing  5. 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  The CO was not markedly high.  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 Oxygen saturation was 99% on RA pre-op, so no evidence for hepatopulmonary syndrome.   - Sildenafil has been on hold with pressors.  5. Cirrhosis: Noted on abdominal US from 2020.  H/o paracentesis.  Had workup at [facility] (though patient does not remember this), viral hepatitis labs negative.  They ended up think that the cirrhosis was cardiogenic.  Repeat abdominal US 5/23 w/ nodular hepatic parenchymal pattern with increased echogenicity consistent with the patient's known cirrhosis. No focal hepatic abnormality identified. Portal vein is patent. Moderate ascites. GI has seen, patient had 4L paracentesis on 5/26. - He may eventually need repeat paracentesis.  6. Coagulopathy: Post-op bleeding in setting of coagulopathy post-CABG in patient with cirrhosis.  Currently seems controlled.  Platelets dropped then recovered, doubt HIT. -Platelets stable.  7. VT: Had after PEA arrest am 6/1.  - Continue amiodarone 200 bid per tube.  8. Anemia: ABLA.  Hgb stable 9.6  9. Pleural effusions: ?hepatic hydrothorax.    - CXR concerning for infiltrates /edema.  10. PEA arrest: Respiratory arrest post-extubation.  ROSC with CPR.  11. Acute hypoxemic respiratory failure:  Extubated 6/2, re-intubated 6/9. Self-extubated overnight 6/12 and  re-intubated. Extubated 6/15 then re-intubated. Now s/p tracheostomy on FiO2 40% 12. Atrial arrhythmias: Atypical atrial flutter currently.   - On heparin gtt 13. ID:  Suspected HCAP with septic shock. Cultures with Staph epidermidis 1/2, suspect contaminant.  - Finished abx 6/16. Had recurrent sepsis physiology. Meropenum started 6/17.  - Suspect recurrent sepsis.  - CCM managing. .  14. Neuro: Altered mental status 6/9 with intubation.  CT head with no bleed or ischemic CVA seen.  EEG with diffuse encephalopathy.  6/15: sudden onset of unresponsiveness with no clear etiology. EEG unremarkable.  Asymmetric pupils.  Agitated 6/19-->Repeat EEG 6/19 with mild diffuse encephalopathy.  15. [place] following.     David Clegg NP-C  04/13/2021 9:10 AM  Agree with  above  He remains on vent through trach. Sedated this am. Unable to wean NE. Now at 59. Ongoing concern for infection.Cor-trak repositioned due to pills being suctioned from trach. On cvvhd running even  General:  sedated on vent through trach HEENT: normal + temporal wasting  + cor-trak  Neck: supple. + trach. Carotids 2+ bilat; no bruits. No lymphadenopathy or thryomegaly appreciated. Cor: PMI nondisplaced. Regular rate & rhythm. No rubs, gallops or murmurs. Lungs: coarse Abdomen: soft, nontender, nondistended. No hepatosplenomegaly. No bruits or masses. Good bowel sounds. Extremities: no cyanosis, clubbing, rash, edema. Cachetic. + RUE AVF Neuro: intubated sedated  Continues to deteriorate. Appears persistently septic despite broad spectrum abx. He is severely malnourished with wasting. Now with decubiti on sacrum and heels.   I don't see a way to a meaningful survival here. Palliative Care has been consulted.   CRITICAL CARE Performed by: David Rose  Total critical care time: 45 minutes  Critical care time was exclusive of separately billable procedures and treating other patients.  Critical care was  necessary to treat or prevent imminent or life-threatening deterioration.  Critical care was time spent personally by me (independent of midlevel providers or residents) on the following activities: development of treatment plan with patient and/or surrogate as  well as nursing, discussions with consultants, evaluation of patient's response to treatment, examination of patient, obtaining history from patient or surrogate, ordering and performing treatments and interventions, ordering and review of laboratory studies, ordering and review of radiographic studies, pulse oximetry and re-evaluation of patient's condition.  David Bickers, MD  11:50 AM

## 2021-04-13 NOTE — Progress Notes (Signed)
ANTICOAGULATION CONSULT NOTE  Pharmacy Consult for Heparin Indication: atrial flutter  Allergies  Allergen Reactions   Shellfish-Derived Products Anaphylaxis   Wasp Venom Protein Anaphylaxis   Penicillins Other (See Comments)    Childhood reaction    Patient Measurements: Height: '6\' 2"'$  (188 cm) Weight: 64.5 kg (142 lb 3.2 oz) IBW/kg (Calculated) : 82.2 Heparin Dosing Weight: 79kg  Vital Signs: Temp: 98.3 F (36.8 C) (06/21 2348) Temp Source: Oral (06/21 2348) BP: 89/56 (06/21 2330) Pulse Rate: 99 (06/21 2330)  Labs: Recent Labs    04/12/21 0407 04/12/21 1553 04/13/21 0405 04/13/21 1600 04/13/21 1636 04/13/21 2255 04/13/21 2259  HGB 10.0*  --  9.6*  --  9.0*  --  9.9*  HCT 33.4*  --  31.6*  --  30.2*  --  29.0*  PLT 274  --  258  --  271  --   --   HEPARINUNFRC 0.42  --  0.65  --   --  0.65  --   CREATININE 1.27* 1.11 1.00 1.17  --   --   --      Estimated Creatinine Clearance: 65.1 mL/min (by C-G formula based on SCr of 1.17 mg/dL).   Medical History: Past Medical History:  Diagnosis Date   ESRD (end stage renal disease) ([place])    Hypertension    Peripheral arterial disease ([place])    Type 2 diabetes mellitus ([place])    Assessment: 49 YOF with recent CABG now with postop afib/flutter. HIT negative per labs.   Heparin level therapeutic, Hgb slightly drifting down.  No overt bleeding or complications noted.  6/21 PM update:  Heparin level is above goal tonight Noted pt having some bloody secretions from ET tube-Hgb is stable from earlier today  Goal of Therapy:  Heparin level 0.3-0.5  units/ml  Monitor platelets by anticoagulation protocol: Yes   Plan:  Dec heparin to 2450 units/hr 0800 heparin level  Narda Bonds, PharmD, BCPS Clinical Pharmacist Phone: [phone]

## 2021-04-13 NOTE — Progress Notes (Signed)
SLP Cancellation Note  Patient Details Name: David Rose. MRN: [id number] DOB: [birth year]   Cancelled treatment:       Reason Eval/Treat Not Completed: Patient not medically ready for PMV per chart review and discussion with RN - will continue to follow.    Osie Bond., M.A. [facility] Pager [phone] Office [phone]  04/13/2021, 3:08 PM

## 2021-04-13 NOTE — Progress Notes (Signed)
[facility] Progress Note Patient Name: David Rose. DOB: [birth year] MRN: [id number]   Date of Service  04/13/2021  HPI/Events of Note  Hypotension - SBP = 80's.  eICU Interventions  Plan: ABG STAT. Increase ceiling on Norepinephrine IV infusion to 60 mcg/min.     Intervention Category Major Interventions: Hypotension - evaluation and management  Lysle Dingwall 04/13/2021, 10:48 PM

## 2021-04-14 DIAGNOSIS — R570 Cardiogenic shock: Secondary | ICD-10-CM | POA: Diagnosis not present

## 2021-04-14 DIAGNOSIS — E43 Unspecified severe protein-calorie malnutrition: Secondary | ICD-10-CM | POA: Diagnosis not present

## 2021-04-14 DIAGNOSIS — I5023 Acute on chronic systolic (congestive) heart failure: Secondary | ICD-10-CM | POA: Diagnosis not present

## 2021-04-14 LAB — RENAL FUNCTION PANEL
Albumin: 1.8 g/dL — ABNORMAL LOW (ref 3.5–5.0)
Albumin: 1.8 g/dL — ABNORMAL LOW (ref 3.5–5.0)
Anion gap: 8 (ref 5–15)
Anion gap: 7 (ref 5–15)
BUN: 22 mg/dL — ABNORMAL HIGH (ref 6–20)
BUN: 18 mg/dL (ref 6–20)
CO2: 24 mmol/L (ref 22–32)
CO2: 24 mmol/L (ref 22–32)
Calcium: 8.6 mg/dL — ABNORMAL LOW (ref 8.9–10.3)
Calcium: 8.5 mg/dL — ABNORMAL LOW (ref 8.9–10.3)
Chloride: 102 mmol/L (ref 98–111)
Chloride: 105 mmol/L (ref 98–111)
Creatinine, Ser: 1.16 mg/dL (ref 0.61–1.24)
Creatinine, Ser: 1.2 mg/dL (ref 0.61–1.24)
GFR, Estimated: >60 mL/min (ref >60)
GFR, Estimated: >60 mL/min (ref >60)
Glucose, Bld: 182 mg/dL — ABNORMAL HIGH (ref 70–99)
Glucose, Bld: 285 mg/dL — ABNORMAL HIGH (ref 70–99)
Phosphorus: 2.9 mg/dL (ref 2.5–4.6)
Phosphorus: 4.3 mg/dL (ref 2.5–4.6)
Potassium: 4.6 mmol/L (ref 3.5–5.1)
Potassium: 5 mmol/L (ref 3.5–5.1)
Sodium: 134 mmol/L — ABNORMAL LOW (ref 135–145)
Sodium: 136 mmol/L (ref 135–145)

## 2021-04-14 LAB — GLUCOSE, CAPILLARY
Glucose-Capillary: 170 mg/dL — ABNORMAL HIGH (ref 70–99)
Glucose-Capillary: 123 mg/dL — ABNORMAL HIGH (ref 70–99)
Glucose-Capillary: 148 mg/dL — ABNORMAL HIGH (ref 70–99)
Glucose-Capillary: 262 mg/dL — ABNORMAL HIGH (ref 70–99)
Glucose-Capillary: 190 mg/dL — ABNORMAL HIGH (ref 70–99)
Glucose-Capillary: 198 mg/dL — ABNORMAL HIGH (ref 70–99)

## 2021-04-14 LAB — CBC
HCT: 29.9 % — ABNORMAL LOW (ref 39.0–52.0)
Hemoglobin: 8.8 g/dL — ABNORMAL LOW (ref 13.0–17.0)
MCH: 30.8 pg (ref 26.0–34.0)
MCHC: 29.4 g/dL — ABNORMAL LOW (ref 30.0–36.0)
MCV: 104.5 fL — ABNORMAL HIGH (ref 80.0–100.0)
Platelets: 263 10*3/uL (ref 150–400)
RBC: 2.86 MIL/uL — ABNORMAL LOW (ref 4.22–5.81)
RDW: 22.7 % — ABNORMAL HIGH (ref 11.5–15.5)
WBC: 22.3 10*3/uL — ABNORMAL HIGH (ref 4.0–10.5)
nRBC: 1.3 % — ABNORMAL HIGH (ref 0.0–0.2)

## 2021-04-14 LAB — MAGNESIUM: Magnesium: 2.6 mg/dL — ABNORMAL HIGH (ref 1.7–2.4)

## 2021-04-14 LAB — HEPARIN LEVEL (UNFRACTIONATED): Heparin Unfractionated: 0.63 IU/mL (ref 0.30–0.70)

## 2021-04-14 MED ORDER — ACETAMINOPHEN 325 MG PO TABS: 650.0000 mg | ORAL_TABLET | Freq: Four times a day (QID) | ORAL | Status: DC | PRN

## 2021-04-14 MED ORDER — HALOPERIDOL 0.5 MG PO TABS
0.5000 mg | ORAL_TABLET | ORAL | Status: DC | PRN
  Filled 2021-04-14: qty 1

## 2021-04-14 MED ORDER — ACETAMINOPHEN 650 MG RE SUPP: 650.0000 mg | Freq: Four times a day (QID) | RECTAL | Status: DC | PRN

## 2021-04-14 MED ORDER — HALOPERIDOL LACTATE 5 MG/ML IJ SOLN: 0.5000 mg | INTRAMUSCULAR | Status: DC | PRN

## 2021-04-14 MED ORDER — HALOPERIDOL LACTATE 2 MG/ML PO CONC
0.5000 mg | ORAL | Status: DC | PRN
Start: 2021-04-14 — End: 2021-04-15
  Filled 2021-04-14: qty 0.3

## 2021-04-14 MED ORDER — VITAL 1.5 CAL PO LIQD
1000.0000 mL | ORAL | Status: DC
  Administered 2021-04-14: 1000 mL

## 2021-04-14 MED ORDER — GLYCOPYRROLATE 0.2 MG/ML IJ SOLN
0.2000 mg | INTRAMUSCULAR | Status: DC | PRN
Start: 2021-04-14 — End: 2021-04-15
  Filled 2021-04-14: qty 1

## 2021-04-14 MED ORDER — GLYCOPYRROLATE 0.2 MG/ML IJ SOLN
0.2000 mg | INTRAMUSCULAR | Status: DC | PRN
  Administered 2021-04-15: 0.2 mg via SUBCUTANEOUS

## 2021-04-14 MED ORDER — ONDANSETRON 4 MG PO TBDP
4.0000 mg | ORAL_TABLET | Freq: Four times a day (QID) | ORAL | Status: DC | PRN
  Filled 2021-04-14: qty 1

## 2021-04-14 MED ORDER — HYDROMORPHONE BOLUS VIA INFUSION
0.5000 mg | INTRAVENOUS | Status: DC | PRN
Start: 2021-04-14 — End: 2021-04-15
  Filled 2021-04-14: qty 1

## 2021-04-14 MED ORDER — GLYCOPYRROLATE 1 MG PO TABS
1.0000 mg | ORAL_TABLET | ORAL | Status: DC | PRN
  Filled 2021-04-14: qty 1

## 2021-04-14 MED ORDER — LORAZEPAM 2 MG/ML PO CONC: 1.0000 mg | ORAL | Status: DC | PRN

## 2021-04-14 MED ORDER — SODIUM CHLORIDE 0.9 % IV SOLN
0.2500 mg/h | INTRAVENOUS | Status: DC
  Administered 2021-04-14: 0.5 mg/h via INTRAVENOUS
  Filled 2021-04-14: qty 5

## 2021-04-14 MED ORDER — LORAZEPAM 2 MG/ML IJ SOLN
2.0000 mg | Freq: Once | INTRAMUSCULAR | Status: AC
  Administered 2021-04-15: 2 mg via INTRAVENOUS
  Filled 2021-04-14: qty 1

## 2021-04-14 MED ORDER — BIOTENE DRY MOUTH MT LIQD: 15.0000 mL | OROMUCOSAL | Status: DC | PRN

## 2021-04-14 MED ORDER — POLYVINYL ALCOHOL 1.4 % OP SOLN
1.0000 [drp] | Freq: Four times a day (QID) | OPHTHALMIC | Status: DC | PRN
  Filled 2021-04-14: qty 15

## 2021-04-14 MED ORDER — LORAZEPAM 2 MG/ML IJ SOLN
1.0000 mg | INTRAMUSCULAR | Status: DC | PRN
  Administered 2021-04-14: 1 mg via INTRAVENOUS
  Filled 2021-04-14: qty 1

## 2021-04-14 MED ORDER — LORAZEPAM 1 MG PO TABS: 1.0000 mg | ORAL_TABLET | ORAL | Status: DC | PRN

## 2021-04-14 MED ORDER — ONDANSETRON HCL 4 MG/2ML IJ SOLN: 4.0000 mg | Freq: Four times a day (QID) | INTRAMUSCULAR | Status: DC | PRN

## 2021-04-14 NOTE — Progress Notes (Signed)
ANTICOAGULATION CONSULT NOTE  Pharmacy Consult for heparin Indication: atrial flutter  Allergies  Allergen Reactions   Shellfish-Derived Products Anaphylaxis   Wasp Venom Protein Anaphylaxis   Penicillins Other (See Comments)    Childhood reaction    Patient Measurements: Height: '6\' 2"'$  (188 cm) Weight: 63 kg (138 lb 14.2 oz) IBW/kg (Calculated) : 82.2 Heparin Dosing Weight: 79kg  Vital Signs: Temp: 98.8 F (37.1 C) (06/22 0744) Temp Source: Oral (06/22 0744) BP: 68/52 (06/22 1000) Pulse Rate: 96 (06/22 1000)  Labs: Recent Labs    04/13/21 0405 04/13/21 1600 04/13/21 1636 04/13/21 2255 04/13/21 2259 04/14/21 0343 04/14/21 0815  HGB 9.6*  --  9.0*  --  9.9* 8.8*  --   HCT 31.6*  --  30.2*  --  29.0* 29.9*  --   PLT 258  --  271  --   --  263  --   HEPARINUNFRC 0.65  --   --  0.65  --   --  0.63  CREATININE 1.00 1.17  --   --   --  1.16  --      Estimated Creatinine Clearance: 64.1 mL/min (by C-G formula based on SCr of 1.16 mg/dL).   Medical History: Past Medical History:  Diagnosis Date   ESRD (end stage renal disease) ([place])    Hypertension    Peripheral arterial disease ([place])    Type 2 diabetes mellitus ([place])    Assessment: 60 YOF with recent CABG now with postop afib/flutter. HIT negative per labs.   Heparin level supratherapeutic, Hgb slightly drifting down.  No overt bleeding or complications noted.  Goal of Therapy:  Heparin level 0.3-0.5  units/ml  Monitor platelets by anticoagulation protocol: Yes   Plan:  -Reduce heparin to 2350 units/h -Daily heparin level and CBC   Arrie Senate, PharmD, BCPS, [facility] Clinical Pharmacist [phone] Please check AMION for all [facility] Pharmacy numbers 04/14/2021

## 2021-04-14 NOTE — Progress Notes (Signed)
NAME:  David Gurski., MRN:  [id number], DOB:  [birth year], LOS: 45 ADMISSION DATE:  02/21/2021, CONSULTATION DATE:  03/30/2021 REFERRING MD:  TCTS CHIEF COMPLAINT: Code   History of Present Illness:  55 year old man with PMHx significant for HTN, T2DM, PAD, ESRD, cirrhosis (query cardiac etiology) who presented for symptomatic ischemic cardiomyopathy.    Patient underwent preoperative optimization with CHF, GI, and Nephrology teams with eventual CABG 5/31. On 6/1, patient developed respiratory distress followed by PEA arrest requiring CPR. Code was c/b increased bleeding around surgical sites, prompting mediastinal exploration 6/1. Intraoperative findings were notable for bleeding around mammary graft site and diffuse oozing for which patient received multiple blood products.   Patient intubated and on pressors postoperatively and PCCM was consulted for assistance with management.   Significant Hospital Events: Including procedures, antibiotic start and stop dates in addition to other pertinent events   5/23 Admitted 5/26 Paracentesis 4L 5/30 HD 5/31 CABG 5/31- 6/1 Code Blue, OR for mediastinal exploration 2/2 hemorrhagic shock induced by CPR. Factor 7, protamine, FFP, cryo given 6/1 PCCM consult, 4U PRBCs and 1U Plt . R pigtail chest tube placed due to limitation of vent weaning. 6/2 Extubated, chest tube 3.2L out past 24 hrs 6/3 Remains on 2L [place], chest tube with 500cc out/ 24 hrs 6/4 Remains on CRRT, 2L [place], [place], NE. 3 chest tubes remain. 6/6 Remains on CRRT, 4 chest tubes in place 6/9 Decreasing pressor requirements and CT output. CRRT transitioned to iHD. Echo repeated. Prior to HD session, patient suddenly became unresponsive with fixed and dilated pupils, minimal gag, breathing spontaneously. Code Stroke called. Reintubated, heparin d/c'ed, STAT CT Head/CTA. 6/10, WBC jump to 89 K, Temp of 102,Remains on 40 of Levo, 40 of Neo, and Vaso at 0.03.Pupils are 6 and non-responsive to  light. Trialysis cath inserted for CVVHD. R IJ is 48 days old, will get PICC and discontinue. Lactate of 2.8. Pigtail cath removed  6/12 coming down on pressors? Patient self extubated and reintubated 6/13 MRI negative for acute findings 6/15 Patient extubated; spoke with family and patient, if he fails extubation trial and requires reintubation they are okay with trach placement. 6/16 placed on cont sedation for trach. Trach completed. Started on LTM for episodes of being unresponsive.  IV cefepime stopped, completing course of antibiotics for pneumonia no organism specified 6/17 fent gtt stopped. Was actually fully awake even on fent gtt. Beginning ATC trials w/ plan for mandatory vent at night.  6/20 Concern for TF in tracheal aspirate.  Last BM 6/19.   Interim History / Subjective:  Condition unchanged.  Palliative care conversation with family who are now requesting one-way separation from ventilation on Thursday.  Objective   Blood pressure (!) 93/53, pulse 92, temperature (!) 96.4 F (35.8 C), temperature source Axillary, resp. rate (!) 21, height _0  (1.88 m), weight 63 kg, SpO2 100 %. 4LNC    Vent Mode: PSV;CPAP FiO2 (%):  [40 %] 40 % Set Rate:  [20 bmp] 20 bmp Vt Set:  [570 mL] 570 mL PEEP:  [5 cmH20] 5 cmH20 Pressure Support:  [10 cmH20] 10 cmH20 Plateau Pressure:  [15 cmH20-18 cmH20] 18 cmH20   Intake/Output Summary (Last 24 hours) at 04/14/2021 1717 Last data filed at 04/14/2021 1700 Gross per 24 hour  Intake 3143.58 ml  Output 3584 ml  Net -440.42 ml    Filed Weights   04/12/21 0500 04/13/21 0500 04/14/21 0306  Weight: 61.4 kg 64.5 kg 63 kg   Physical  Examination: General: critically ill appearing adult male lying in bed in NAD HEENT: MM pink/moist, trach midline with yellow sputum at site, pink tinged secretions from [place], anicteric, temporal wasting Neuro: eyes closed, will move all extremities spontaneously CV: s1s2 rrr, ST on monitor currently, no  m/r/g PULM: non-labored on PSV 10/5, lungs bilaterally with rhonchi GI: soft, bsx4 active, cortrak in place Extremities: warm/dry, no edema  Skin: no rashes or lesions   Resolved Hospital Problem list   Fever Post-op mediastinal hemorrhage Cirrhotic and hemorrhage- induced consumptive coagulopathy Hemorrhagic shock, resolved Acute blood loss anemia Thrombocytopenia- in setting of blood loss, coagulopathy, and cirrhosis Elevated Alk Phos/AST VT 6/1  Assessment & Plan:   CAD post CABG c/b  in hospital PEA cardiac arrest precipitated by hypoxia and blood loss Cardiac Cirrhosis with ascites Multifactorial shock Chronic Afib/flutter Acute metabolic encephalopathy Acute hypoxemic respiratory failure due to inability to protect airway Pulmonary edema and Bilateral pleural effusions (chest tubes removed.) and LLL HAP vs pleural effusion Status post tracheostomy 6/16. ESRD on HD PTA Hyperkalemia  Hypophosphatemia   DM2  Sacral pressure ulcer Rule Out TE Fistula / Upper Airway Reflux   Plan -PRVC as rest mode ventilation  -goal PSV with ATC intervals during the day  -continue meropenem -follow up respiratory culture  -continue levophed for MAP >65  -assess cortisol  -UF for goal of euvolemia, may need a little fluid back at this point given increased pressor needs  - planned transition to comfort care on Thursday   Best Practice (right click and "Reselect all SmartList Selections" daily)   Diet/type: tubefeeds Pain/Anxiety/Delirium protocol Yes and RASS goal: 0 VAP protocol (if indicated): Yes DVT prophylaxis: systemic heparin GI prophylaxis: PPI Glucose control:  SSI and Basal coverage Central venous access:  Yes, and it is still needed Arterial line:  N/A Foley:  N/A Mobility:  OOB  PT consulted: Yes Studies pending: None Code Status:  full code Last date of multidisciplinary goals of care discussion [per primary ] placing care management consult. Disposition will  be a challenge  Disposition: remains critically ill, will stay in intensive care   CRITICAL CARE Performed by: Kipp Brood   Total critical care time: 35 minutes  Critical care time was exclusive of separately billable procedures and treating other patients.  Critical care was necessary to treat or prevent imminent or life-threatening deterioration.  Critical care was time spent personally by me on the following activities: development of treatment plan with patient and/or surrogate as well as nursing, discussions with consultants, evaluation of patient's response to treatment, examination of patient, obtaining history from patient or surrogate, ordering and performing treatments and interventions, ordering and review of laboratory studies, ordering and review of radiographic studies, pulse oximetry, re-evaluation of patient's condition and participation in multidisciplinary rounds.  Kipp Brood, MD [facility] ICU Physician [place]  Pager: [phone] Mobile: [phone] After hours: [phone].

## 2021-04-14 NOTE — Progress Notes (Signed)
Nutrition Brief Note  Chart reviewed. Pt now transitioning to comfort care. Recommend stopping TF No further nutrition interventions planned at this time.  Please re-consult as needed.   Kerman Passey MS, RDN, LDN, CNSC Registered Dietitian III Clinical Nutrition RD Pager and On-Call Pager Number Located in [place]

## 2021-04-14 NOTE — Progress Notes (Signed)
[facility] ROUNDING NOTE   Subjective:   Interval History: 55 year old gentleman with history of hypertension diabetes mellitus type 2 end-stage renal disease on chronic hemodialysis.  History of congestive heart failure with systolic dysfunction, history of cirrhosis coronary artery disease status post cardiac arrest status post CABG.  2 episodes of sudden unresponsiveness 04/01/2020 04/07/2021.  Has been evaluated by neurology and underwent EEG monitoring as well as MRI of brain.  He was started on CRRT 03/31/2021 and continues on this mode of therapy.  Patient transitioning to palliative care.  Discussed with critical care  Blood pressure 77/40 pulse 94 temperature 98.8 O2 sats 100% FiO2 40%  Sodium 134 potassium 4.6 chloride 102 CO2 24 BUN 22 creatinine 1.16 glucose 182 calcium 8.6 phosphorus 2.9 magnesium 2.6 albumin 1.8 hemoglobin 8.8  Fluid balance kept even anuric renal failure history of end-stage renal disease  Objective:  Vital signs in last 24 hours:  Temp:  [97 F (36.1 C)-98.8 F (37.1 C)] 98.8 F (37.1 C) (06/22 0744) Pulse Rate:  [96-103] 96 (06/22 1000) Resp:  [16-28] 19 (06/22 1000) BP: (61-119)/(29-86) 68/52 (06/22 1000) SpO2:  [98 %-100 %] 100 % (06/22 1000) Arterial Line BP: (70-130)/(22-60) 95/33 (06/22 1000) FiO2 (%):  [40 %] 40 % (06/22 1035) Weight:  [63 kg] 63 kg (06/22 0306)  Weight change: -1.5 kg Filed Weights   04/12/21 0500 04/13/21 0500 04/14/21 0306  Weight: 61.4 kg 64.5 kg 63 kg    Intake/Output: I/O last 3 completed shifts: In: 4361.6 [I.V.:3436.7; NG/GT:424.8; IV Piggyback:500.1] Out: 4559 [Other:3109; SEGBT:5176]   Intake/Output this shift:  Total I/O In: 340.7 [I.V.:264.7; NG/GT:76] Out: 352 [Other:352]  Gen: ill appearing, cachectic, looks exhausted NECK: trach in place, c/d/i CVS: irregular, tachycardic Resp: coarse mechanical bilaterally Abd: soft, + Suprapubic and RLQ pain- flinches Ext: sarcopenic, dependent thigh  edema improved ACCESS: L nontunneled femoral HD catheter, R UE AVF + T/B NEURO: grimaces to pain, R pupil 4 mm sluggish, L pupil 6 mm nonreactive   Basic Metabolic Panel: Recent Labs  Lab 04/11/21 0111 04/11/21 1619 04/12/21 0055 04/12/21 0407 04/12/21 1553 04/13/21 0405 04/13/21 1600 04/13/21 2259 04/14/21 0343  NA 135   < > 134* 137 134* 134* 135 138 134*  K 4.8   < > 6.2* 4.9  5.0 4.5 4.7 4.5 4.6 4.6  CL 102   < > 104 103 103 103 106  --  102  CO2 26   < > 19* '28 24 24 22  ' --  24  GLUCOSE 263*   < > 210* 76 144* 120* 190*  --  182*  BUN 29*   < > 34* 32* 30* 23* 25*  --  22*  CREATININE 1.35*   < > 1.29* 1.27* 1.11 1.00 1.17  --  1.16  CALCIUM 8.3*   < > 8.2* 8.6* 8.3* 8.5* 8.3*  --  8.6*  MG 2.5*  --  2.4 2.4  --  2.4  --   --  2.6*  PHOS 1.5*   < > 2.2* 1.6* 3.6 2.7 2.9  --  2.9   < > = values in this interval not displayed.     Liver Function Tests: Recent Labs  Lab 04/07/21 1924 04/08/21 0427 04/12/21 0407 04/12/21 1553 04/13/21 0405 04/13/21 1600 04/14/21 0343  AST 41  --   --   --   --   --   --   ALT 21  --   --   --   --   --   --  ALKPHOS 202*  --   --   --   --   --   --   BILITOT 2.1*  --   --   --   --   --   --   PROT 7.0  --   --   --   --   --   --   ALBUMIN 2.3*   < > 2.0* 1.9* 1.8* 1.8* 1.8*   < > = values in this interval not displayed.    No results for input(s): LIPASE, AMYLASE in the last 168 hours. Recent Labs  Lab 04/07/21 2138 04/12/21 1553  AMMONIA 18 43*     CBC: Recent Labs  Lab 04/07/21 1924 04/07/21 2002 04/12/21 0055 04/12/21 0407 04/13/21 0405 04/13/21 1636 04/13/21 2259 04/14/21 0343  WBC 29.5*   < > 24.8* 25.6* 27.6* 25.3*  --  22.3*  NEUTROABS 28.0*  --   --   --   --   --   --   --   HGB 11.7*   < > 11.2* 10.0* 9.6* 9.0* 9.9* 8.8*  HCT 36.8*   < > 36.8* 33.4* 31.6* 30.2* 29.0* 29.9*  MCV 97.6   < > 104.0* 104.4* 103.9* 104.9*  --  104.5*  PLT 215   < > 202 274 258 271  --  263   < > = values in this  interval not displayed.     Cardiac Enzymes: No results for input(s): CKTOTAL, CKMB, CKMBINDEX, TROPONINI in the last 168 hours.  BNP: Invalid input(s): POCBNP  CBG: Recent Labs  Lab 04/13/21 1552 04/13/21 1955 04/13/21 2344 04/14/21 0339 04/14/21 0741  GLUCAP 187* 143* 103* 170* 61*     Microbiology: Results for orders placed or performed during the hospital encounter of 03/09/2021  Surgical PCR screen     Status: None   Collection Time: 03/22/2021  2:37 PM   Specimen: Nasopharyngeal Swab; Nasal Swab  Result Value Ref Range Status   MRSA, PCR NEGATIVE NEGATIVE Final   Staphylococcus aureus NEGATIVE NEGATIVE Final    Comment: (NOTE) The Xpert SA Assay (FDA approved for NASAL specimens in patients 20 years of age and older), is one component of a comprehensive surveillance program. It is not intended to diagnose infection nor to guide or monitor treatment. Performed at [facility], [place] [address], [place], [place] [address]   SARS CORONAVIRUS 2 (TAT 6-24 HRS) Nasopharyngeal Nasopharyngeal Swab     Status: None   Collection Time: 03/01/2021  3:12 PM   Specimen: Nasopharyngeal Swab  Result Value Ref Range Status   SARS Coronavirus 2 NEGATIVE NEGATIVE Final    Comment: (NOTE) SARS-CoV-2 target nucleic acids are NOT DETECTED.  The SARS-CoV-2 RNA is generally detectable in upper and lower respiratory specimens during the acute phase of infection. Negative results do not preclude SARS-CoV-2 infection, do not rule out co-infections with other pathogens, and should not be used as the sole basis for treatment or other patient management decisions. Negative results must be combined with clinical observations, patient history, and epidemiological information. The expected result is Negative.  Fact Sheet for Patients: SugarRoll.be  Fact Sheet for Healthcare Providers: https://www.woods-mathews.com/  This test is not yet  approved or cleared by the [place] FDA and  has been authorized for detection and/or diagnosis of SARS-CoV-2 by FDA under an Emergency Use Authorization (EUA). This EUA will remain  in effect (meaning this test can be used) for the duration of the COVID-19 declaration under  Se ction 564(b)(1) of the Act, 21 U.S.C. section 360bbb-3(b)(1), unless the authorization is terminated or revoked sooner.  Performed at [facility], [place] [address], [place], [place] [address]   Surgical pcr screen     Status: None   Collection Time: 02/23/2021  2:43 AM   Specimen: Nasal Mucosa; Nasal Swab  Result Value Ref Range Status   MRSA, PCR NEGATIVE NEGATIVE Final   Staphylococcus aureus NEGATIVE NEGATIVE Final    Comment: (NOTE) The Xpert SA Assay (FDA approved for NASAL specimens in patients 61 years of age and older), is one component of a comprehensive surveillance program. It is not intended to diagnose infection nor to guide or monitor treatment. Performed at [facility], [place] [address], [place], [place] [address]   MRSA PCR Screening     Status: None   Collection Time: 03/28/21  1:18 PM   Specimen: Nasopharyngeal  Result Value Ref Range Status   MRSA by PCR NEGATIVE NEGATIVE Final    Comment:        The GeneXpert MRSA Assay (FDA approved for NASAL specimens only), is one component of a comprehensive MRSA colonization surveillance program. It is not intended to diagnose MRSA infection nor to guide or monitor treatment for MRSA infections. Performed at [facility], [place] [address] [place], [place] [address]   Culture, Urine     Status: None   Collection Time: 04/02/21  8:17 AM   Specimen: Urine, Random  Result Value Ref Range Status   Specimen Description URINE, RANDOM  Final   Special Requests NONE  Final   Culture   Final    NO GROWTH Performed at [facility], [place] [address] [place], [place] [address]    Report Status 04/03/2021 FINAL  Final   Culture, blood (routine x 2)     Status: Abnormal   Collection Time: 04/02/21 11:44 AM   Specimen: BLOOD LEFT HAND  Result Value Ref Range Status   Specimen Description BLOOD LEFT HAND  Final   Special Requests   Final    BOTTLES DRAWN AEROBIC ONLY Blood Culture results may not be optimal due to an inadequate volume of blood received in culture bottles   Culture  Setup Time   Final    GRAM POSITIVE COCCI IN CLUSTERS AEROBIC BOTTLE ONLY CRITICAL RESULT CALLED TO, READ BACK BY AND VERIFIED WITH: PHARMD LAURA SEAY BY MESSAN H. AT 0225 ON 04/05/2021    Culture (A)  Final    STAPHYLOCOCCUS EPIDERMIDIS THE SIGNIFICANCE OF ISOLATING THIS ORGANISM FROM A SINGLE VENIPUNCTURE CANNOT BE PREDICTED WITHOUT FURTHER CLINICAL AND CULTURE CORRELATION. SUSCEPTIBILITIES AVAILABLE ONLY ON REQUEST. Performed at [facility], [place] [address] [place], [place] [address]    Report Status 04/06/2021 FINAL  Final  Blood Culture ID Panel (Reflexed)     Status: Abnormal   Collection Time: 04/02/21 11:44 AM  Result Value Ref Range Status   Enterococcus faecalis NOT DETECTED NOT DETECTED Final   Enterococcus Faecium NOT DETECTED NOT DETECTED Final   Listeria monocytogenes NOT DETECTED NOT DETECTED Final   Staphylococcus species DETECTED (A) NOT DETECTED Final    Comment: CRITICAL RESULT CALLED TO, READ BACK BY AND VERIFIED WITH: PHARMD LAURA SEAY BY MESSAN H. AT 0225 ON 04/05/2021    Staphylococcus aureus (BCID) NOT DETECTED NOT DETECTED Final   Staphylococcus epidermidis DETECTED (A) NOT DETECTED Final    Comment: Methicillin (oxacillin) resistant coagulase negative staphylococcus. Possible blood culture contaminant (unless  isolated from more than one blood culture draw or clinical case suggests pathogenicity). No antibiotic treatment is indicated for blood  culture contaminants. CRITICAL RESULT CALLED TO, READ BACK BY AND VERIFIED WITH: PHARMD LAURA SEAY BY MESSAN H. AT 0225 ON 04/05/2021     Staphylococcus lugdunensis NOT DETECTED NOT DETECTED Final   Streptococcus species NOT DETECTED NOT DETECTED Final   Streptococcus agalactiae NOT DETECTED NOT DETECTED Final   Streptococcus pneumoniae NOT DETECTED NOT DETECTED Final   Streptococcus pyogenes NOT DETECTED NOT DETECTED Final   A.calcoaceticus-baumannii NOT DETECTED NOT DETECTED Final   Bacteroides fragilis NOT DETECTED NOT DETECTED Final   Enterobacterales NOT DETECTED NOT DETECTED Final   Enterobacter cloacae complex NOT DETECTED NOT DETECTED Final   Escherichia coli NOT DETECTED NOT DETECTED Final   Klebsiella aerogenes NOT DETECTED NOT DETECTED Final   Klebsiella oxytoca NOT DETECTED NOT DETECTED Final   Klebsiella pneumoniae NOT DETECTED NOT DETECTED Final   Proteus species NOT DETECTED NOT DETECTED Final   Salmonella species NOT DETECTED NOT DETECTED Final   Serratia marcescens NOT DETECTED NOT DETECTED Final   Haemophilus influenzae NOT DETECTED NOT DETECTED Final   Neisseria meningitidis NOT DETECTED NOT DETECTED Final   Pseudomonas aeruginosa NOT DETECTED NOT DETECTED Final   Stenotrophomonas maltophilia NOT DETECTED NOT DETECTED Final   Candida albicans NOT DETECTED NOT DETECTED Final   Candida auris NOT DETECTED NOT DETECTED Final   Candida glabrata NOT DETECTED NOT DETECTED Final   Candida krusei NOT DETECTED NOT DETECTED Final   Candida parapsilosis NOT DETECTED NOT DETECTED Final   Candida tropicalis NOT DETECTED NOT DETECTED Final   Cryptococcus neoformans/gattii NOT DETECTED NOT DETECTED Final   Methicillin resistance mecA/C DETECTED (A) NOT DETECTED Final    Comment: CRITICAL RESULT CALLED TO, READ BACK BY AND VERIFIED WITH: PHARMD LAURA SEAY BY MESSAN H. AT 0225 ON 04/05/2021 Performed at [facility], [address], [place], [place] [address]   Expectorated Sputum Assessment w Gram Stain, Rflx to Resp Cult     Status: None   Collection Time: 04/02/21  3:19 PM   Specimen: Expectorated Sputum   Result Value Ref Range Status   Specimen Description EXPECTORATED SPUTUM  Final   Special Requests NONE  Final   Sputum evaluation   Final    THIS SPECIMEN IS ACCEPTABLE FOR SPUTUM CULTURE Performed at [facility],  [address] [place], [place] [address]    Report Status 04/03/2021 FINAL  Final  Culture, Respiratory w Gram Stain     Status: None   Collection Time: 04/02/21  3:19 PM  Result Value Ref Range Status   Specimen Description EXPECTORATED SPUTUM  Final   Special Requests NONE Reflexed from [id number]  Final   Gram Stain   Final    MODERATE WBC PRESENT,BOTH PMN AND MONONUCLEAR ABUNDANT YEAST    Culture   Final    ABUNDANT Normal respiratory flora-no Staph aureus or Pseudomonas seen Performed at [facility], [place] [address] [place], [place] [address]    Report Status 04/05/2021 FINAL  Final  Culture, Respiratory w Gram Stain     Status: None   Collection Time: 04/10/21 10:46 AM   Specimen: Tracheal Aspirate; Respiratory  Result Value Ref Range Status   Specimen Description TRACHEAL ASPIRATE  Final   Special Requests NONE  Final   Gram Stain   Final    ABUNDANT WBC PRESENT,BOTH PMN AND MONONUCLEAR FEW GRAM POSITIVE COCCI RARE GRAM VARIABLE ROD Performed  at [facility], [place] [address], [place], [place] [address]    Culture ABUNDANT STAPHYLOCOCCUS EPIDERMIDIS  Final   Report Status 04/13/2021 FINAL  Final   Organism ID, Bacteria STAPHYLOCOCCUS EPIDERMIDIS  Final      Susceptibility   Staphylococcus epidermidis - MIC*    CIPROFLOXACIN >=8 RESISTANT Resistant     ERYTHROMYCIN >=8 RESISTANT Resistant     GENTAMICIN <=0.5 SENSITIVE Sensitive     OXACILLIN >=4 RESISTANT Resistant     TETRACYCLINE 2 SENSITIVE Sensitive     VANCOMYCIN <=0.5 SENSITIVE Sensitive     TRIMETH/SULFA 80 RESISTANT Resistant     CLINDAMYCIN >=8 RESISTANT Resistant     RIFAMPIN <=0.5 SENSITIVE Sensitive     Inducible Clindamycin NEGATIVE Sensitive     * ABUNDANT  STAPHYLOCOCCUS EPIDERMIDIS    Coagulation Studies: No results for input(s): LABPROT, INR in the last 72 hours.  Urinalysis: No results for input(s): COLORURINE, LABSPEC, PHURINE, GLUCOSEU, HGBUR, BILIRUBINUR, KETONESUR, PROTEINUR, UROBILINOGEN, NITRITE, LEUKOCYTESUR in the last 72 hours.  Invalid input(s): APPERANCEUR    Imaging: DG CHEST PORT 1 VIEW  Result Date: 04/13/2021 CLINICAL DATA:  Tracheostomy. Respiratory failure. Recent open heart surgery. EXAM: PORTABLE CHEST 1 VIEW COMPARISON:  Abdomen 04/12/2021.  Chest x-ray 04/12/2021 FINDINGS: Tracheostomy tube in stable position. Dobbhoff tube noted with tip coiled over the upper stomach. Prior CABG. Stable cardiomegaly. Persistent bilateral pulmonary infiltrates/edema. Small left pleural effusion again noted. No pneumothorax. IMPRESSION: 1. Tracheostomy tube stable position. Dobbhoff tube again noted with tip coiled in the upper stomach. 2.  Prior CABG.  Stable cardiomegaly. 3. Diffuse bilateral pulmonary infiltrates/edema small left pleural effusion again noted. Findings suggest CHF. Electronically Signed   By: Marcello Moores  Register   On: 04/13/2021 06:56   DG Abd Portable 1V  Result Date: 04/12/2021 CLINICAL DATA:  Check feeding catheter placement EXAM: PORTABLE ABDOMEN - 1 VIEW COMPARISON:  Film from earlier in the same day. FINDINGS: Feeding catheter is noted in the gastric fundus stable in appearance from the prior exam. IMPRESSION: Stable appearance of feeding catheter within the gastric fundus. Electronically Signed   By: Inez Catalina M.D.   On: 04/12/2021 17:14   DG Abd Portable 1V  Result Date: 04/12/2021 CLINICAL DATA:  NG tube repositioning EXAM: PORTABLE ABDOMEN - 1 VIEW COMPARISON:  Same day radiograph FINDINGS: The nasoenteric feeding catheter tip is looped with tip overlying the gastric fundus in the left upper abdomen. There is gaseous distension of bowel in the upper abdomen. Persistent left lower lung opacity. Prior median  sternotomy. No acute osseous abnormality. IMPRESSION: Nasoenteric feeding catheter tip is looped with tip overlying the gastric fundus in the left upper abdomen, and may have been partially retracted since the prior exam. Suggest repositioning. Electronically Signed   By: Maurine Simmering   On: 04/12/2021 14:11   DG Naso G Tube Plc W/Fl W/Rad  Result Date: 04/13/2021 CLINICAL DATA:  Need for enteral feeding. EXAM: NASO G TUBE PLACEMENT WITH FL AND WITH RAD CONTRAST:  18m OMNIPAQUE IOHEXOL 300 MG/ML  SOLN FLUOROSCOPY TIME:  Fluoroscopy Time:  10 minutes 48 seconds Radiation Exposure Index (if provided by the fluoroscopic device): 157 mGy Number of Acquired Spot Images: 1 COMPARISON:  Abdomen of April 12, 2021. FINDINGS: Staff on the floor attempting fluoroscopic tube placement were unsuccessful. Tube was in the pylorus when I arrived for an attempt at manipulation. Stiff Amplatz wire was passed without difficulty into the tube. The tube was then advanced off the wire into the  descending duodenum. Single KUB confirms tube placement. Consolidative changes are noted in the medial LEFT lung base. Stool and gas present in the colon, limited assessment of the abdomen. Post median sternotomy changes. IMPRESSION: Technically successful fluoroscopic placement of a feeding tube into the descending duodenum as discussed. Electronically Signed   By: Zetta Bills M.D.   On: 04/13/2021 16:36     Medications:     prismasol BGK 4/2.5 500 mL/hr at 04/14/21 0818    prismasol BGK 4/2.5 300 mL/hr at 04/14/21 0216   sodium chloride Stopped (04/10/21 0811)   amiodarone 30 mg/hr (04/14/21 1019)   dexmedetomidine (PRECEDEX) IV infusion 0.7 mcg/kg/hr (04/14/21 1000)   dextrose 5 % and 0.9% NaCl Stopped (04/13/21 1554)   feeding supplement (VITAL 1.5 CAL) 30 mL/hr at 04/14/21 0824   heparin 2,350 Units/hr (04/14/21 1048)   lactated ringers     lactated ringers Stopped (04/01/21 1902)   meropenem (MERREM) IV Stopped (04/14/21  0981)   norepinephrine (LEVOPHED) Adult infusion 46 mcg/min (04/14/21 1020)   prismasol BGK 4/2.5 1,500 mL/hr at 04/14/21 0818    aspirin  81 mg Per Tube Daily   atorvastatin  40 mg Per Tube Daily   bisacodyl  10 mg Oral Daily   Or   bisacodyl  10 mg Rectal Daily   chlorhexidine gluconate (MEDLINE KIT)  15 mL Mouth Rinse BID   Chlorhexidine Gluconate Cloth  6 each Topical Q0600   clonazePAM  0.5 mg Per Tube BID   collagenase   Topical Daily   darbepoetin (ARANESP) injection - NON-DIALYSIS  60 mcg Subcutaneous Q Thu-1800   docusate  100 mg Per Tube BID   feeding supplement (PROSource TF)  45 mL Per Tube QID   hydrocortisone  20 mg Oral Daily   insulin aspart  0-24 Units Subcutaneous Q4H   ipratropium-albuterol  3 mL Nebulization TID   mouth rinse  15 mL Mouth Rinse 10 times per day   midodrine  30 mg Per Tube TID WC   multivitamin  1 tablet Per Tube QHS   nutrition supplement (JUVEN)  1 packet Per Tube BID BM   pantoprazole sodium  40 mg Per Tube BID   polyethylene glycol  17 g Per Tube Daily   QUEtiapine  100 mg Per Tube BID   sodium chloride flush  10-40 mL Intracatheter Q12H   sodium chloride flush  3 mL Intravenous Q12H   heparin, lactated ringers, lidocaine (PF), lidocaine HCl (PF), lip balm, metoprolol tartrate, ondansetron (ZOFRAN) IV, oxyCODONE, polyvinyl alcohol, sodium chloride, white petrolatum  Assessment/ Plan:  End-stage renal disease.Tuesday Thursday Saturday dialysis [place].  Has been on CRRT 04/05/2021.  Being kept even.  Transitioning to hospice. Acute encephalopathy periods of unresponsiveness appreciate assistance from neurology Septic shock broad-spectrum antibiotics as per primary care service CAD status post CABG x4 complicated by PEA arrest Acute on chronic congestive heart failure repeat 2D echo 45% Pulmonary hypertension per heart failure team Hyperkalemia resolved with CRRT Hypophosphatemia continue to replete Atrial fibrillation/flutter on systemic  heparin    LOS: [place] '@TODAY' '@10' :57 AM

## 2021-04-14 NOTE — Progress Notes (Signed)
Patient ID: David Rose., male   DOB: 1966/08/23, 55 y.o.   MRN: [id number]      Advanced Heart Failure Rounding Note  PCP-Cardiologist: Carlyle Dolly, MD    Subjective:    Events  - 5/31 CABG x 4 with LIMA-LAD, SVG-RCA, SVG-OM, SVG-D.  - 5/31  Coagulopathic post-op with multiple blood products.  Extubated post-op but developed respiratory distress => PEA arrest then VT. Reintubated.   - 6/1 Returned to OR for mediastinal exploration - 6/2  Extubated - 6/9 AMS--> CT negative for acute bleed or ischemic CVA. Intubated. Suspected septic shock. CVVH restarted. - 6/15: Extubated but later in the day had sudden onset of unresponsiveness re-intubated. EEG negative. NH3 18.  - 6/16 trach  - 6/16 abx stopped Finished Flagyl/Eraxis/cefepime for HCAP/septic shock.  - 6/18 meropenum started - 6/19 EEG - mild diffuse encephalopathy. No seizures.  - 6/20 Suctioning pills from trach. Unable to advance cortrak.  -Remain on vent via trach 40%. Agitated. Will not follow commands for me or interact. Swinging arms and legs at times. Pressor requirements continue to increase. Now on NE at 25. Off CVVHD,   Family has decided on 1-way extubation tomorrow with transition to comfort care,    Echo (6/9) with EF 45-50%, inferolateral severe hypokinesis, moderate RV dysfunction.    Objective:   Weight Range: 63 kg Body mass index is 17.83 kg/m.   Vital Signs:   Temp:  [96.4 F (35.8 C)-98.8 F (37.1 C)] 96.4 F (35.8 C) (06/22 1606) Pulse Rate:  [91-100] 95 (06/22 2200) Resp:  [16-28] 17 (06/22 2200) BP: (68-114)/(39-86) 90/43 (06/22 2200) SpO2:  [100 %] 100 % (06/22 2200) Arterial Line BP: (86-130)/(29-40) 92/32 (06/22 2200) FiO2 (%):  [40 %] 40 % (06/22 2024) Weight:  [63 kg] 63 kg (06/22 0306) Last BM Date: 04/14/21  Weight change: Filed Weights   04/12/21 0500 04/13/21 0500 04/14/21 0306  Weight: 61.4 kg 64.5 kg 63 kg    Intake/Output:   Intake/Output Summary (Last 24 hours)  at 04/14/2021 2254 Last data filed at 04/14/2021 2200 Gross per 24 hour  Intake 2953.71 ml  Output 1759 ml  Net 1194.71 ml       Physical Exam   General:  Cachetic. Critically ill appearing  on vent thru trach agitated HEENT: + temporal wasting. Left pupil dialted Neck: supple. + trach  Cor: PMI nondisplaced. Regular rate & rhythm. No rubs, gallops or murmurs. Lungs:coarse Abdomen: soft, nontender, nondistended. No hepatosplenomegaly. No bruits or masses. Good bowel sounds. Extremities: no cyanosis, clubbing, rash, edema Neuro: lethargic. Agitated at times  not following commands   Telemetry  Sinus 90-100 Personally reviewed  Labs    CBC Recent Labs    04/13/21 1636 04/13/21 2259 04/14/21 0343  WBC 25.3*  --  22.3*  HGB 9.0* 9.9* 8.8*  HCT 30.2* 29.0* 29.9*  MCV 104.9*  --  104.5*  PLT 271  --  951    Basic Metabolic Panel Recent Labs    04/13/21 0405 04/13/21 1600 04/14/21 0343 04/14/21 1600  NA 134*   < > 134* 136  K 4.7   < > 4.6 5.0  CL 103   < > 102 105  CO2 24   < > 24 24  GLUCOSE 120*   < > 182* 285*  BUN 23*   < > 22* 18  CREATININE 1.00   < > 1.16 1.20  CALCIUM 8.5*   < > 8.6* 8.5*  MG 2.4  --  2.6*  --   PHOS 2.7   < > 2.9 4.3   < > = values in this interval not displayed.    Liver Function Tests Recent Labs    04/14/21 0343 04/14/21 1600  ALBUMIN 1.8* 1.8*    No results for input(s): LIPASE, AMYLASE in the last 72 hours. Cardiac Enzymes No results for input(s): CKTOTAL, CKMB, CKMBINDEX, TROPONINI in the last 72 hours.  BNP: BNP (last 3 results) Recent Labs    03/12/2021 1545  BNP 1,127.8*     ProBNP (last 3 results) No results for input(s): PROBNP in the last 8760 hours.   D-Dimer No results for input(s): DDIMER in the last 72 hours.  Hemoglobin A1C No results for input(s): HGBA1C in the last 72 hours. Fasting Lipid Panel No results for input(s): CHOL, HDL, LDLCALC, TRIG, CHOLHDL, LDLDIRECT in the last 72  hours. Thyroid Function Tests No results for input(s): TSH, T4TOTAL, T3FREE, THYROIDAB in the last 72 hours.  Invalid input(s): FREET3   Other results:   Imaging    No results found.    Medications:     Scheduled Medications:  chlorhexidine gluconate (MEDLINE KIT)  15 mL Mouth Rinse BID   Chlorhexidine Gluconate Cloth  6 each Topical Q0600   clonazePAM  0.5 mg Per Tube BID   collagenase   Topical Daily   hydrocortisone  20 mg Oral Daily   insulin aspart  0-24 Units Subcutaneous Q4H   ipratropium-albuterol  3 mL Nebulization TID   LORazepam  2 mg Intravenous Once   mouth rinse  15 mL Mouth Rinse 10 times per day   midodrine  30 mg Per Tube TID WC   pantoprazole sodium  40 mg Per Tube BID   polyethylene glycol  17 g Per Tube Daily   QUEtiapine  100 mg Per Tube BID   sodium chloride flush  10-40 mL Intracatheter Q12H   sodium chloride flush  3 mL Intravenous Q12H    Infusions:  sodium chloride Stopped (04/10/21 0811)   amiodarone 30 mg/hr (04/14/21 2005)   dexmedetomidine (PRECEDEX) IV infusion 0.5 mcg/kg/hr (04/14/21 2200)   dextrose 5 % and 0.9% NaCl Stopped (04/13/21 1554)   feeding supplement (VITAL 1.5 CAL) 1,000 mL (04/14/21 1659)   HYDROmorphone 0.5 mg/hr (04/14/21 2200)   lactated ringers     lactated ringers Stopped (04/01/21 1902)   meropenem (MERREM) IV Stopped (04/14/21 2134)   norepinephrine (LEVOPHED) Adult infusion 35 mcg/min (04/14/21 2215)    PRN Medications: acetaminophen **OR** acetaminophen, antiseptic oral rinse, glycopyrrolate **OR** glycopyrrolate **OR** glycopyrrolate, haloperidol **OR** haloperidol **OR** haloperidol lactate, heparin, HYDROmorphone, lactated ringers, lip balm, LORazepam **OR** LORazepam **OR** LORazepam, metoprolol tartrate, ondansetron **OR** ondansetron (ZOFRAN) IV, oxyCODONE, polyvinyl alcohol, sodium chloride, white petrolatum     Assessment/Plan   1. CAD: Severe 3VD with decreased EF.  - s/p CABG 5/31 LIMA-LAD,  SVG-RCA, SVG-OM, SVG-D.  2. Acute/chronic HF with mid range EF - cardiogenic shock post-CABG: Suspect ischemic cardiomyopathy.  - Echo (6/9) with EF 45-50%, inferolateral severe hypokinesis, moderate RV dysfunction.  - On NE 35 - Continue midodrine 30 mg tid.  3. ESRD: - CVVHD stopped 4. Acute respiratory failure - s/p trach 6/16 on Vent.  -  CCM managing  5. Anoxic brain injury  6. DNR/DNI  Palliative Care involved. D/w Family. Plan terminal extubation tomorrow with cessation of pressors and switch to comfort care.  CRITICAL CARE Performed by: Glori Bickers  Total critical care time: 35 minutes  Critical care time was  exclusive of separately billable procedures and treating other patients.  Critical care was necessary to treat or prevent imminent or life-threatening deterioration.  Critical care was time spent personally by me (independent of midlevel providers or residents) on the following activities: development of treatment plan with patient and/or surrogate as well as nursing, discussions with consultants, evaluation of patient's response to treatment, examination of patient, obtaining history from patient or surrogate, ordering and performing treatments and interventions, ordering and review of laboratory studies, ordering and review of radiographic studies, pulse oximetry and re-evaluation of patient's condition.     Glori Bickers MD 04/14/2021 10:54 PM

## 2021-04-14 NOTE — Progress Notes (Signed)
Daily Progress Note   Patient Name: David Rose.       Date: 04/14/2021 DOB: 04-Jul-1966  Age: 55 y.o. MRN#: [id number] Attending Physician: Wonda Olds, MD Primary Care Physician: Ollen Bowl, MD Admit Date: 03/18/2021  Reason for Consultation/Follow-up: Non pain symptom management, Pain control, and Withdrawal of life-sustaining treatment     Subjective: Discussed with my colleague Wadie Lessen, NP and with ICU RN [place].   Our understanding is that the family will arrive from out of town for a compassionate extubation at 9:00 tomorrow.  Patient showing agitation in bed.  Does not respond or follow commands for me but does swing his left arm and leg.  Currently still on CRRT and pressors.  BP is soft with SBP in 90s.     Assessment: Frail, agitated, mal nourished.   Patient Profile/HPI:  54 year old gentleman with history of hypertension diabetes mellitus type 2 end-stage renal disease on chronic hemodialysis.  History of congestive heart failure with systolic dysfunction, history of cirrhosis coronary artery disease status post cardiac arrest status post CABG.  Two episodes of sudden unresponsiveness 04/01/2020 04/07/2021.  Has been evaluated by neurology and underwent EEG monitoring as well as MRI of brain.  He was started on CRRT 04/11/2021 and continues on this mode of therapy.    Complicated Q000111Q hospitalization.  Unfortunately [name] is not responding as hoped with  full medical support.     Patient encephalopathic, unable to follow commands.  He remains vent dependant, remains on CVVHD      Length of Stay: 30   Vital Signs: BP (!) 91/47 (BP Location: Left Arm)   Pulse 91   Temp (!) 96.4 F (35.8 C) (Axillary)   Resp (!) 21   Ht '6\' 2"'$  (1.88 m)   Wt 63 kg    SpO2 100%   BMI 17.83 kg/m  SpO2: SpO2: 100 % O2 Device: O2 Device: Ventilator O2 Flow Rate: O2 Flow Rate (L/min): 10 L/min       Palliative Assessment/Data: 10%     Palliative Care Plan    Recommendations/Plan:  Agitation:  Ativan 1 mg IV q 4 hours PRN  In preparation for compassionate wean on 6/23 at 9:00 will DC CRRT now.  This should improve BP as well.  Will continue low dose tube  feeds at 30s to avoid the need for amps of D50.  Will initiate a very low dose dilaudid gtt (0.2 mg / hour) for comfort and request that it be increased to 1.0 mg / hour at 4:00 am if his BP will tolerate it.  Will request ativan 2 mg be given at 8:00 am to help ensure a comfortable wean at 9:00 am.  Discussed this with [place] ICU RN.    Code Status:  DNR  Prognosis:  Hours - Days   Discharge Planning: Anticipated Hospital Death  Thank you for allowing the Palliative Medicine Team to assist in the care of this patient.  Total time spent:  25 min.     Greater than 50%  of this time was spent counseling and coordinating care related to the above assessment and plan.  Florentina Jenny, PA-C Palliative Medicine  Please contact Palliative MedicineTeam phone at [phone] for questions and concerns between 7 am - 7 pm.   Please see AMION for individual provider pager numbers.

## 2021-04-14 NOTE — Progress Notes (Signed)
NAME:  David Favia., MRN:  [id number], DOB:  09-07-1966, LOS: 45 ADMISSION DATE:  03/21/2021, CONSULTATION DATE:  04/13/2021 REFERRING MD:  TCTS CHIEF COMPLAINT: Code   History of Present Illness:  55 year old man with PMHx significant for HTN, T2DM, PAD, ESRD, cirrhosis (query cardiac etiology) who presented for symptomatic ischemic cardiomyopathy.    Patient underwent preoperative optimization with CHF, GI, and Nephrology teams with eventual CABG 5/31. On 6/1, patient developed respiratory distress followed by PEA arrest requiring CPR. Code was c/b increased bleeding around surgical sites, prompting mediastinal exploration 6/1. Intraoperative findings were notable for bleeding around mammary graft site and diffuse oozing for which patient received multiple blood products.   Patient intubated and on pressors postoperatively and PCCM was consulted for assistance with management.   Significant Hospital Events: Including procedures, antibiotic start and stop dates in addition to other pertinent events   5/23 Admitted 5/26 Paracentesis 4L 5/30 HD 5/31 CABG 5/31- 6/1 Code Blue, OR for mediastinal exploration 2/2 hemorrhagic shock induced by CPR. Factor 7, protamine, FFP, cryo given 6/1 PCCM consult, 4U PRBCs and 1U Plt . R pigtail chest tube placed due to limitation of vent weaning. 6/2 Extubated, chest tube 3.2L out past 24 hrs 6/3 Remains on 2L [place], chest tube with 500cc out/ 24 hrs 6/4 Remains on CRRT, 2L [facility], [place], NE. 3 chest tubes remain. 6/6 Remains on CRRT, 4 chest tubes in place 6/9 Decreasing pressor requirements and CT output. CRRT transitioned to iHD. Echo repeated. Prior to HD session, patient suddenly became unresponsive with fixed and dilated pupils, minimal gag, breathing spontaneously. Code Stroke called. Reintubated, heparin d/c'ed, STAT CT Head/CTA. 6/10, WBC jump to 89 K, Temp of 102,Remains on 40 of Levo, 40 of Neo, and Vaso at 0.03.Pupils are 6 and non-responsive to  light. Trialysis cath inserted for CVVHD. R IJ is 69 days old, will get PICC and discontinue. Lactate of 2.8. Pigtail cath removed  6/12 coming down on pressors? Patient self extubated and reintubated 6/13 MRI negative for acute findings 6/15 Patient extubated; spoke with family and patient, if he fails extubation trial and requires reintubation they are okay with trach placement. 6/16 placed on cont sedation for trach. Trach completed. Started on LTM for episodes of being unresponsive.  IV cefepime stopped, completing course of antibiotics for pneumonia no organism specified 6/17 fent gtt stopped. Was actually fully awake even on fent gtt. Beginning ATC trials w/ plan for mandatory vent at night.  6/20 Concern for TF in tracheal aspirate.  Last BM 6/19.   Interim History / Subjective:  Condition unchanged.  Palliative care conversation with family who are now requesting one-way separation from ventilation on Thursday.  Objective   Blood pressure (!) 93/53, pulse 92, temperature (!) 96.4 F (35.8 C), temperature source Axillary, resp. rate (!) 21, height _0  (1.88 m), weight 63 kg, SpO2 100 %. 4LNC    Vent Mode: PSV;CPAP FiO2 (%):  [40 %] 40 % Set Rate:  [20 bmp] 20 bmp Vt Set:  [570 mL] 570 mL PEEP:  [5 cmH20] 5 cmH20 Pressure Support:  [10 cmH20] 10 cmH20 Plateau Pressure:  [15 cmH20-18 cmH20] 18 cmH20   Intake/Output Summary (Last 24 hours) at 04/14/2021 1719 Last data filed at 04/14/2021 1700 Gross per 24 hour  Intake 3143.58 ml  Output 3584 ml  Net -440.42 ml    Filed Weights   04/12/21 0500 04/13/21 0500 04/14/21 0306  Weight: 61.4 kg 64.5 kg 63 kg   Physical  Examination: General: critically ill appearing adult male lying in bed in NAD HEENT: MM pink/moist, trach midline with yellow sputum at site, pink tinged secretions from ballard, anicteric, temporal wasting Neuro: eyes closed, will move all extremities spontaneously CV: s1s2 rrr, ST on monitor currently, no  m/r/g PULM: non-labored on PSV 10/5, lungs bilaterally with rhonchi GI: soft, bsx4 active, cortrak in place Extremities: warm/dry, no edema  Skin: no rashes or lesions   Resolved Hospital Problem list   Fever Post-op mediastinal hemorrhage Cirrhotic and hemorrhage- induced consumptive coagulopathy Hemorrhagic shock, resolved Acute blood loss anemia Thrombocytopenia- in setting of blood loss, coagulopathy, and cirrhosis Elevated Alk Phos/AST VT 6/1  Assessment & Plan:   CAD post CABG c/b  in hospital PEA cardiac arrest precipitated by hypoxia and blood loss Cardiac Cirrhosis with ascites Multifactorial shock Chronic Afib/flutter Acute metabolic encephalopathy Acute hypoxemic respiratory failure due to inability to protect airway Pulmonary edema and Bilateral pleural effusions (chest tubes removed.) and LLL HAP vs pleural effusion Status post tracheostomy 6/16. ESRD on HD PTA Hyperkalemia  Hypophosphatemia   DM2  Sacral pressure ulcer Rule Out TE Fistula / Upper Airway Reflux   Plan -No change in support at this time. -For scheduled withdrawal of care on Thursday.  Best Practice (right click and "Reselect all SmartList Selections" daily)   Diet/type: tubefeeds Pain/Anxiety/Delirium protocol Yes and RASS goal: 0 VAP protocol (if indicated): Yes DVT prophylaxis: systemic heparin GI prophylaxis: PPI Glucose control:  SSI and Basal coverage Central venous access:  Yes, and it is still needed Arterial line:  N/A Foley:  N/A Mobility:  OOB  PT consulted: Yes Studies pending: None Code Status:  full code Last date of multidisciplinary goals of care discussion [per primary ] placing care management consult. Disposition will be a challenge  Disposition: remains critically ill, will stay in intensive care   Kipp Brood, MD [facility] ICU Physician [place]  Pager: [phone] Mobile: [phone] After hours: [phone].

## 2021-04-15 DIAGNOSIS — Z515 Encounter for palliative care: Secondary | ICD-10-CM

## 2021-04-15 LAB — GLUCOSE, CAPILLARY: Glucose-Capillary: 151 mg/dL — ABNORMAL HIGH (ref 70–99)

## 2021-04-15 MED ORDER — HYDROMORPHONE BOLUS VIA INFUSION
1.0000 mg | INTRAVENOUS | Status: DC | PRN
Start: 2021-04-15 — End: 2021-04-15
  Filled 2021-04-15: qty 2

## 2021-04-23 NOTE — Progress Notes (Signed)
Daily Progress Note   Patient Name: David Rose.       Date: 04/30/21 DOB: August 06, 1966  Age: 55 y.o. MRN#: [id number] Attending Physician: Wonda Olds, MD Primary Care Physician: Ollen Bowl, MD Admit Date: 03/11/2021  Reason for Consultation/Follow-up: Non pain symptom management, Psychosocial/spiritual support, Terminal Care, and Withdrawal of life-sustaining treatment     Adjusted orders at 6 am this morning in order to prepare patient for compassionate wean.   ICU RN David Rose reached out to give an update and provide closed loop communication.  Subjective: David Isaacs, PA-S2; David Rose, Chaplain; David Michaelis, NP; and me went to bedside to meet patient's family at 9:00 am.  Two sisters and brother were at bedside.  Patient appeared very comfortable.  RN had already done an excellent job of making environment peaceful and family comfortable.  Chaplain asked several questions of the family to assess their needs an what Zhane "Junior" would want.  She then lead the group in a beautiful heart felt prayer.     Afterward Misha and the Resp therapist took Mr. Calandro off the ventilator.  He remained very peaceful and passed away 15 - 30 minutes later.   Assessment: Comfortable passing.   Patient Profile/HPI:  55 year old gentleman with history of hypertension diabetes mellitus type 2 end-stage renal disease on chronic hemodialysis.  History of congestive heart failure with systolic dysfunction, history of cirrhosis coronary artery disease status post cardiac arrest status post CABG.  Two episodes of sudden unresponsiveness 04/01/2020 04/07/2021.  Has been evaluated by neurology and underwent EEG monitoring as well as MRI of brain.  He was started on CRRT 04/10/2021 and continues  on this mode of therapy.    Complicated Q000111Q hospitalization.  Unfortunately Mr. Sliman is not responding as hoped with  full medical support.     Patient encephalopathic, unable to follow commands.  He remains vent dependant, remains on CVVHD      Length of Stay: 31   Vital Signs: BP (!) 80/44   Pulse 95   Temp 99.1 F (37.3 C) (Oral)   Resp 11   Ht '6\' 2"'$  (1.88 m)   Wt 63 kg   SpO2 100%   BMI 17.83 kg/m  SpO2: SpO2: 100 % O2 Device: O2 Device: Room Air O2 Flow Rate: O2  Flow Rate (L/min): 10 L/min       Palliative Assessment/Data:  0%     Palliative Care Plan    Recommendations/Plan: My great appreciation to [facility] ICU RN for one of the most peaceful comfortable extubations I have attended  Code Status:  DNR  Prognosis:  Patient is now deceased.  Discharge Planning: Anticipated Hospital Death  Care plan was discussed with medical team, RN, family.  Thank you for allowing the Palliative Medicine Team to assist in the care of this patient.  Total time spent:  35 min.     Greater than 50%  of this time was spent counseling and coordinating care related to the above assessment and plan.  Florentina Jenny, PA-C Palliative Medicine  Please contact Palliative MedicineTeam phone at [phone] for questions and concerns between 7 am - 7 pm.   Please see AMION for individual provider pager numbers.

## 2021-04-23 NOTE — Progress Notes (Signed)
[facility] ROUNDING NOTE   Subjective:   Interval History: 55 year old gentleman with history of hypertension diabetes mellitus type 2 end-stage renal disease on chronic hemodialysis.  History of congestive heart failure with systolic dysfunction, history of cirrhosis coronary artery disease status post cardiac arrest status post CABG.  2 episodes of sudden unresponsiveness 04/01/2020 04/07/2021.  Has been evaluated by neurology and underwent EEG monitoring as well as MRI of brain.  He was started on CRRT 03/26/2021 and continues on this mode of therapy.  Patient transitioning to palliative care.  Discussed with critical care  Blood pressure 98/47 pulse 102 temperature 99.1 O2 sats 100% room air  Sodium 136 potassium 5 chloride 105 CO2 24 BUN 18 creatinine 1.2 glucose 285 calcium 8.5 phosphorus 4.3 hemoglobin 8.8  Fluid balance kept even anuric renal failure history of end-stage renal disease  Objective:  Vital signs in last 24 hours:  Temp:  [96.4 F (35.8 C)-99.1 F (37.3 C)] 99.1 F (37.3 C) (06/23 0000) Pulse Rate:  [91-105] 95 (06/23 0900) Resp:  [11-25] 11 (06/23 0900) BP: (68-101)/(39-67) 80/44 (06/23 0700) SpO2:  [100 %] 100 % (06/23 0900) Arterial Line BP: (79-114)/(24-40) 113/35 (06/23 0800) FiO2 (%):  [40 %] 40 % (06/23 0759) Weight:  [63 kg] 63 kg (06/23 0800)  Weight change:  Filed Weights   04/13/21 0500 04/14/21 0306 17-Apr-2021 0800  Weight: 64.5 kg 63 kg 63 kg    Intake/Output: I/O last 3 completed shifts: In: 4262.1 [I.V.:2599.1; NG/GT:1163; IV Piggyback:499.9] Out: 5501 [TAEWY:5749; TXLEZ:7471]   Intake/Output this shift:  Total I/O In: 354.3 [I.V.:294.3; NG/GT:60] Out: -   Gen: ill appearing, cachectic, looks exhausted NECK: trach in place, c/d/i CVS: irregular, tachycardic Resp: coarse mechanical bilaterally Abd: soft, + Suprapubic and RLQ pain- flinches Ext: sarcopenic, dependent thigh edema improved ACCESS: L nontunneled femoral HD  catheter, R UE AVF + T/B NEURO: grimaces to pain, R pupil 4 mm sluggish, L pupil 6 mm nonreactive   Basic Metabolic Panel: Recent Labs  Lab 04/11/21 0111 04/11/21 1619 04/12/21 0055 04/12/21 0407 04/12/21 1553 04/13/21 0405 04/13/21 1600 04/13/21 2259 04/14/21 0343 04/14/21 1600  NA 135   < > 134* 137 134* 134* 135 138 134* 136  K 4.8   < > 6.2* 4.9  5.0 4.5 4.7 4.5 4.6 4.6 5.0  CL 102   < > 104 103 103 103 106  --  102 105  CO2 26   < > 19* '28 24 24 22  ' --  24 24  GLUCOSE 263*   < > 210* 76 144* 120* 190*  --  182* 285*  BUN 29*   < > 34* 32* 30* 23* 25*  --  22* 18  CREATININE 1.35*   < > 1.29* 1.27* 1.11 1.00 1.17  --  1.16 1.20  CALCIUM 8.3*   < > 8.2* 8.6* 8.3* 8.5* 8.3*  --  8.6* 8.5*  MG 2.5*  --  2.4 2.4  --  2.4  --   --  2.6*  --   PHOS 1.5*   < > 2.2* 1.6* 3.6 2.7 2.9  --  2.9 4.3   < > = values in this interval not displayed.     Liver Function Tests: Recent Labs  Lab 04/12/21 1553 04/13/21 0405 04/13/21 1600 04/14/21 0343 04/14/21 1600  ALBUMIN 1.9* 1.8* 1.8* 1.8* 1.8*    No results for input(s): LIPASE, AMYLASE in the last 168 hours. Recent Labs  Lab 04/12/21 1553  AMMONIA 43*  CBC: Recent Labs  Lab 04/12/21 0055 04/12/21 0407 04/13/21 0405 04/13/21 1636 04/13/21 2259 04/14/21 0343  WBC 24.8* 25.6* 27.6* 25.3*  --  22.3*  HGB 11.2* 10.0* 9.6* 9.0* 9.9* 8.8*  HCT 36.8* 33.4* 31.6* 30.2* 29.0* 29.9*  MCV 104.0* 104.4* 103.9* 104.9*  --  104.5*  PLT 202 274 258 271  --  263     Cardiac Enzymes: No results for input(s): CKTOTAL, CKMB, CKMBINDEX, TROPONINI in the last 168 hours.  BNP: Invalid input(s): POCBNP  CBG: Recent Labs  Lab 04/14/21 1124 04/14/21 1600 04/14/21 1924 04/14/21 2326 29-Apr-2021 0320  GLUCAP 148* 71* 190* 64* 151*     Microbiology: Results for orders placed or performed during the hospital encounter of 03/11/2021  Surgical PCR screen     Status: None   Collection Time: 03/02/2021  2:37 PM   Specimen:  Nasopharyngeal Swab; Nasal Swab  Result Value Ref Range Status   MRSA, PCR NEGATIVE NEGATIVE Final   Staphylococcus aureus NEGATIVE NEGATIVE Final    Comment: (NOTE) The Xpert SA Assay (FDA approved for NASAL specimens in patients 40 years of age and older), is one component of a comprehensive surveillance program. It is not intended to diagnose infection nor to guide or monitor treatment. Performed at [facility], [place] [address], [place], [place] [address]   SARS CORONAVIRUS 2 (TAT 6-24 HRS) Nasopharyngeal Nasopharyngeal Swab     Status: None   Collection Time: 02/27/2021  3:12 PM   Specimen: Nasopharyngeal Swab  Result Value Ref Range Status   SARS Coronavirus 2 NEGATIVE NEGATIVE Final    Comment: (NOTE) SARS-CoV-2 target nucleic acids are NOT DETECTED.  The SARS-CoV-2 RNA is generally detectable in upper and lower respiratory specimens during the acute phase of infection. Negative results do not preclude SARS-CoV-2 infection, do not rule out co-infections with other pathogens, and should not be used as the sole basis for treatment or other patient management decisions. Negative results must be combined with clinical observations, patient history, and epidemiological information. The expected result is Negative.  Fact Sheet for Patients: SugarRoll.be  Fact Sheet for Healthcare Providers: https://www.woods-mathews.com/  This test is not yet approved or cleared by the [place] FDA and  has been authorized for detection and/or diagnosis of SARS-CoV-2 by FDA under an Emergency Use Authorization (EUA). This EUA will remain  in effect (meaning this test can be used) for the duration of the COVID-19 declaration under Se ction 564(b)(1) of the Act, 21 U.S.C. section 360bbb-3(b)(1), unless the authorization is terminated or revoked sooner.  Performed at [facility], [place] [address] [place], [place] [address]   Surgical  pcr screen     Status: None   Collection Time: 03/17/2021  2:43 AM   Specimen: Nasal Mucosa; Nasal Swab  Result Value Ref Range Status   MRSA, PCR NEGATIVE NEGATIVE Final   Staphylococcus aureus NEGATIVE NEGATIVE Final    Comment: (NOTE) The Xpert SA Assay (FDA approved for NASAL specimens in patients 53 years of age and older), is one component of a comprehensive surveillance program. It is not intended to diagnose infection nor to guide or monitor treatment. Performed at [facility], [place] [address] [place], [place] [address]   MRSA PCR Screening     Status: None   Collection Time: 03/28/21  1:18 PM   Specimen: Nasopharyngeal  Result Value Ref Range Status   MRSA by PCR NEGATIVE NEGATIVE Final    Comment:  The GeneXpert MRSA Assay (FDA approved for NASAL specimens only), is one component of a comprehensive MRSA colonization surveillance program. It is not intended to diagnose MRSA infection nor to guide or monitor treatment for MRSA infections. Performed at [facility], [place] [address] [place], [place] [address]   Culture, Urine     Status: None   Collection Time: 04/02/21  8:17 AM   Specimen: Urine, Random  Result Value Ref Range Status   Specimen Description URINE, RANDOM  Final   Special Requests NONE  Final   Culture   Final    NO GROWTH Performed at [facility], [place] [address] [place], [place] [address]    Report Status 04/03/2021 FINAL  Final  Culture, blood (routine x 2)     Status: Abnormal   Collection Time: 04/02/21 11:44 AM   Specimen: BLOOD LEFT HAND  Result Value Ref Range Status   Specimen Description BLOOD LEFT HAND  Final   Special Requests   Final    BOTTLES DRAWN AEROBIC ONLY Blood Culture results may not be optimal due to an inadequate volume of blood received in culture bottles   Culture  Setup Time   Final    GRAM POSITIVE COCCI IN CLUSTERS AEROBIC BOTTLE ONLY CRITICAL RESULT CALLED TO, READ BACK BY AND VERIFIED  WITH: PHARMD LAURA SEAY BY MESSAN H. AT 0225 ON 04/05/2021    Culture (A)  Final    STAPHYLOCOCCUS EPIDERMIDIS THE SIGNIFICANCE OF ISOLATING THIS ORGANISM FROM A SINGLE VENIPUNCTURE CANNOT BE PREDICTED WITHOUT FURTHER CLINICAL AND CULTURE CORRELATION. SUSCEPTIBILITIES AVAILABLE ONLY ON REQUEST. Performed at [facility], [place] [address], [place], [place] [address]    Report Status 04/06/2021 FINAL  Final  Blood Culture ID Panel (Reflexed)     Status: Abnormal   Collection Time: 04/02/21 11:44 AM  Result Value Ref Range Status   Enterococcus faecalis NOT DETECTED NOT DETECTED Final   Enterococcus Faecium NOT DETECTED NOT DETECTED Final   Listeria monocytogenes NOT DETECTED NOT DETECTED Final   Staphylococcus species DETECTED (A) NOT DETECTED Final    Comment: CRITICAL RESULT CALLED TO, READ BACK BY AND VERIFIED WITH: PHARMD LAURA SEAY BY MESSAN H. AT 0225 ON 04/05/2021    Staphylococcus aureus (BCID) NOT DETECTED NOT DETECTED Final   Staphylococcus epidermidis DETECTED (A) NOT DETECTED Final    Comment: Methicillin (oxacillin) resistant coagulase negative staphylococcus. Possible blood culture contaminant (unless isolated from more than one blood culture draw or clinical case suggests pathogenicity). No antibiotic treatment is indicated for blood  culture contaminants. CRITICAL RESULT CALLED TO, READ BACK BY AND VERIFIED WITH: PHARMD LAURA SEAY BY MESSAN H. AT 0225 ON 04/05/2021    Staphylococcus lugdunensis NOT DETECTED NOT DETECTED Final   Streptococcus species NOT DETECTED NOT DETECTED Final   Streptococcus agalactiae NOT DETECTED NOT DETECTED Final   Streptococcus pneumoniae NOT DETECTED NOT DETECTED Final   Streptococcus pyogenes NOT DETECTED NOT DETECTED Final   A.calcoaceticus-baumannii NOT DETECTED NOT DETECTED Final   Bacteroides fragilis NOT DETECTED NOT DETECTED Final   Enterobacterales NOT DETECTED NOT DETECTED Final   Enterobacter cloacae complex NOT DETECTED NOT  DETECTED Final   Escherichia coli NOT DETECTED NOT DETECTED Final   Klebsiella aerogenes NOT DETECTED NOT DETECTED Final   Klebsiella oxytoca NOT DETECTED NOT DETECTED Final   Klebsiella pneumoniae NOT DETECTED NOT DETECTED Final   Proteus species NOT DETECTED NOT DETECTED Final   Salmonella species NOT DETECTED NOT DETECTED Final   Serratia  marcescens NOT DETECTED NOT DETECTED Final   Haemophilus influenzae NOT DETECTED NOT DETECTED Final   Neisseria meningitidis NOT DETECTED NOT DETECTED Final   Pseudomonas aeruginosa NOT DETECTED NOT DETECTED Final   Stenotrophomonas maltophilia NOT DETECTED NOT DETECTED Final   Candida albicans NOT DETECTED NOT DETECTED Final   Candida auris NOT DETECTED NOT DETECTED Final   Candida glabrata NOT DETECTED NOT DETECTED Final   Candida krusei NOT DETECTED NOT DETECTED Final   Candida parapsilosis NOT DETECTED NOT DETECTED Final   Candida tropicalis NOT DETECTED NOT DETECTED Final   Cryptococcus neoformans/gattii NOT DETECTED NOT DETECTED Final   Methicillin resistance mecA/C DETECTED (A) NOT DETECTED Final    Comment: CRITICAL RESULT CALLED TO, READ BACK BY AND VERIFIED WITH: PHARMD LAURA SEAY BY MESSAN H. AT 0225 ON 04/05/2021 Performed at [facility], [place] [address] [place], [place] [address]   Expectorated Sputum Assessment w Gram Stain, Rflx to Resp Cult     Status: None   Collection Time: 04/02/21  3:19 PM   Specimen: Expectorated Sputum  Result Value Ref Range Status   Specimen Description EXPECTORATED SPUTUM  Final   Special Requests NONE  Final   Sputum evaluation   Final    THIS SPECIMEN IS ACCEPTABLE FOR SPUTUM CULTURE Performed at [facility], [place] [address] [place], [place] [address]    Report Status 04/03/2021 FINAL  Final  Culture, Respiratory w Gram Stain     Status: None   Collection Time: 04/02/21  3:19 PM  Result Value Ref Range Status   Specimen Description EXPECTORATED SPUTUM  Final   Special Requests NONE  Reflexed from [id number]  Final   Gram Stain   Final    MODERATE WBC PRESENT,BOTH PMN AND MONONUCLEAR ABUNDANT YEAST    Culture   Final    ABUNDANT Normal respiratory flora-no Staph aureus or Pseudomonas seen Performed at [facility], [place] [address] [place], [place] [address]    Report Status 04/05/2021 FINAL  Final  Culture, Respiratory w Gram Stain     Status: None   Collection Time: 04/10/21 10:46 AM   Specimen: Tracheal Aspirate; Respiratory  Result Value Ref Range Status   Specimen Description TRACHEAL ASPIRATE  Final   Special Requests NONE  Final   Gram Stain   Final    ABUNDANT WBC PRESENT,BOTH PMN AND MONONUCLEAR FEW GRAM POSITIVE COCCI RARE GRAM VARIABLE ROD Performed at [facility], [place] [address], [place],  [address]    Culture ABUNDANT STAPHYLOCOCCUS EPIDERMIDIS  Final   Report Status 04/13/2021 FINAL  Final   Organism ID, Bacteria STAPHYLOCOCCUS EPIDERMIDIS  Final      Susceptibility   Staphylococcus epidermidis - MIC*    CIPROFLOXACIN >=8 RESISTANT Resistant     ERYTHROMYCIN >=8 RESISTANT Resistant     GENTAMICIN <=0.5 SENSITIVE Sensitive     OXACILLIN >=4 RESISTANT Resistant     TETRACYCLINE 2 SENSITIVE Sensitive     VANCOMYCIN <=0.5 SENSITIVE Sensitive     TRIMETH/SULFA 80 RESISTANT Resistant     CLINDAMYCIN >=8 RESISTANT Resistant     RIFAMPIN <=0.5 SENSITIVE Sensitive     Inducible Clindamycin NEGATIVE Sensitive     * ABUNDANT STAPHYLOCOCCUS EPIDERMIDIS    Coagulation Studies: No results for input(s): LABPROT, INR in the last 72 hours.  Urinalysis: No results for input(s): COLORURINE, LABSPEC, PHURINE, GLUCOSEU, HGBUR, BILIRUBINUR, KETONESUR, PROTEINUR, UROBILINOGEN, NITRITE, LEUKOCYTESUR in the last 72 hours.  Invalid input(s): APPERANCEUR  Imaging: DG Naso G Tube Plc W/Fl W/Rad  Result Date: 04/13/2021 CLINICAL DATA:  Need for enteral feeding. EXAM: NASO G TUBE PLACEMENT WITH FL AND WITH RAD CONTRAST:  12m OMNIPAQUE IOHEXOL  300 MG/ML  SOLN FLUOROSCOPY TIME:  Fluoroscopy Time:  10 minutes 48 seconds Radiation Exposure Index (if provided by the fluoroscopic device): 157 mGy Number of Acquired Spot Images: 1 COMPARISON:  Abdomen of April 12, 2021. FINDINGS: Staff on the floor attempting fluoroscopic tube placement were unsuccessful. Tube was in the pylorus when I arrived for an attempt at manipulation. Stiff Amplatz wire was passed without difficulty into the tube. The tube was then advanced off the wire into the descending duodenum. Single KUB confirms tube placement. Consolidative changes are noted in the medial LEFT lung base. Stool and gas present in the colon, limited assessment of the abdomen. Post median sternotomy changes. IMPRESSION: Technically successful fluoroscopic placement of a feeding tube into the descending duodenum as discussed. Electronically Signed   By: GZetta BillsM.D.   On: 04/13/2021 16:36     Medications:    sodium chloride Stopped (04/10/21 0811)   amiodarone Stopped ([id number])   dexmedetomidine (PRECEDEX) IV infusion 0.4 mcg/kg/hr (02022-07-140800)   dextrose 5 % and 0.9% NaCl Stopped (04/13/21 1554)   feeding supplement (VITAL 1.5 CAL) 1,000 mL (04/14/21 1659)   HYDROmorphone 1 mg/hr (007/14/220800)   lactated ringers     lactated ringers Stopped (04/01/21 1902)   meropenem (MERREM) IV Stopped (02022/07/140656)   norepinephrine (LEVOPHED) Adult infusion 45 mcg/min (02022-07-140800)    chlorhexidine gluconate (MEDLINE KIT)  15 mL Mouth Rinse BID   Chlorhexidine Gluconate Cloth  6 each Topical Q0600   clonazePAM  0.5 mg Per Tube BID   collagenase   Topical Daily   hydrocortisone  20 mg Oral Daily   insulin aspart  0-24 Units Subcutaneous Q4H   mouth rinse  15 mL Mouth Rinse 10 times per day   midodrine  30 mg Per Tube TID WC   pantoprazole sodium  40 mg Per Tube BID   polyethylene glycol  17 g Per Tube Daily   QUEtiapine  100 mg Per Tube BID   sodium chloride flush  10-40 mL  Intracatheter Q12H   sodium chloride flush  3 mL Intravenous Q12H   acetaminophen **OR** acetaminophen, antiseptic oral rinse, glycopyrrolate **OR** glycopyrrolate **OR** glycopyrrolate, haloperidol **OR** haloperidol **OR** haloperidol lactate, heparin, HYDROmorphone, lactated ringers, lip balm, LORazepam **OR** LORazepam **OR** LORazepam, metoprolol tartrate, ondansetron **OR** ondansetron (ZOFRAN) IV, oxyCODONE, polyvinyl alcohol, sodium chloride, white petrolatum  Assessment/ Plan:  End-stage renal disease.Tuesday Thursday Saturday dialysis [facility]  Has been on CRRT 04/06/2021.  Being kept even.  Transitioning to hospice.  Patient now on full comfort care.  Discontinue CRRT as per family's wishes. Acute encephalopathy periods of unresponsiveness appreciate assistance from neurology Septic shock broad-spectrum antibiotics as per primary care service CAD status post CABG x4 complicated by PEA arrest Acute on chronic congestive heart failure repeat 2D echo 45% Pulmonary hypertension per heart failure team Hyperkalemia resolved with CRRT Hypophosphatemia continue to replete Atrial fibrillation/flutter on systemic heparin    LOS: 3[name]'@TODAY' '@9' :21 AM

## 2021-04-23 NOTE — Progress Notes (Signed)
Pt removed from ventilator and placed on room air per orders and in accordance with the family's wishes.  Family at bedside at this time.

## 2021-04-23 NOTE — Progress Notes (Signed)
  Patient terminally extubated today.   Comfort care measures in place.   HF team will s/o.   Glori Bickers, MD  9:47 AM

## 2021-04-23 NOTE — Death Summary Note (Signed)
DEATH SUMMARY   Patient Details  Name: David Rose. MRN: [id number] DOB: 07/26/1966  Admission/Discharge Information   Admit Date:  11-Apr-2021  Date of Death: Date of Death: 05/12/21  Time of Death: Time of Death: 0932  Length of Stay: 02-18-2023  Referring Physician: Ollen Bowl, MD   Reason(s) for Hospitalization  Coronary artery disease, acute on chronic systolic heart failure  Diagnoses  Preliminary cause of death:  Secondary Diagnoses (including complications and co-morbidities):  Active Problems:   Acute on chronic systolic heart failure (HCC)   History of open heart surgery   Protein-calorie malnutrition, severe   Acute respiratory failure with hypoxia (HCC)   Postoperative hypovolemic shock   Chest tube in place   Chronic kidney disease   Endotracheally intubated   Palliative care encounter History of the following:  ESRD (end stage renal disease) ([place])     Hypertension     Peripheral arterial disease ([place])     Type 2 diabetes mellitus [facility])    Brief Hospital Course (including significant findings, care, treatment, and services provided and events leading to death)  David Rose. is a 55 y.o. year old male who  was transferred in stable condition from the OR to [facility] after undergoing a CABG x 4 on 03/07/2021. He was extubated early th morning of post operative day one. Shortly after, patient developed hypoxia, hypotension, and he went into PEA followed by ventricular fibrillation. Code blue was called and ACLS protocol was initiated. Patient was intubated and had good oxygenation. Dr. Orvan Seen evaluated patient and it was decided he needed to return to the OR for mediastinal exploration. He was coagulopathic. He received multiple PRBCs and FFP.  Consultation has been obtained with cardiology, critical care medicine and nephrology to assist with management.  He has been placed on multiple pressors including epinephrine, norepinephrine, vasopressin as well as  milrinone.  He has had postoperative atrial fibrillation and is placed on amiodarone drip with plans to eventually convert to oral dosing.   He has been placed on CVVH.  An HIT panel has been ordered but not done. Platelet count on 03/26/2021 was 42,000 and anticoagulation is being held currently.  Platelet count on 06/06 was up to 91,000. Thrombocytopenia did resolve as platelets on 06/09 was up to 300,000. He was able to be extubated on 03/25/2021 and has been weaned to nasal cannula by 03/26/2021.   He was been weaned off Epinephrine.  He continues to require Levophed and Milrinone.  Milrinone was stopped on  6/07.  He was started on Sildenafil for pulmonary hypertension.  He was tolerating a regular diet and his feeding tube was removed on 6/5.  As his thrombocytopenia resolved, HIT was unlikely, he was started on a Heparin drip on 06/07 (as has atrial fibrillation/flutter). He was start on Amiodarone as well. He was put on Midodrine. Echo was done on 06/09 and showed LVEF 45-60%, basal to mid inferolateral severe hypokinesis, mild AV sclerosis but significant valvular disease, and trivial pericardial effusoin. He was put on Cefepime for HCAP/sepsis. His WBC went as high as 88,400. CT of abdomen and pelvis showed stigmata of cirrhosis, anasarca with moderate ascites,  moderate distention of the gallbladder, no gallbladder wall thickening, mild cardiomegaly, bilateral pleural effusions (possibly loculated on the left). He was found to have yeast in sputum. Infectious disease was consulted. In addition to Cefepime, Flagyl and Eraxis were started. Midodrine was stopped on 06/10 but later restarted. Cortrak and temporary HD catheter  were placed on 06/10. Patient self extubated and was re intubated on 06/12. He was then put on Vasopressin. Eraxis and Flagyl were stopped on 06/13. He was extubated the morning of 06/15. He was also started on Cefepime for suspected HCAP/septic shock. Vasopressin was stopped. Patient had a  percutaneous tracheostomy with bronchoscopic guidance on 06/16. WBC is down to 24,400 on 06/16 and he is still on Cefepime.  The patient developed atrial fibrillation and was treated with Amiodarone.  He again developed fever with increase in white count.  He required increase in Levophed requirements.  Meropenem was started for sepsis coverage.  Trach trial was attempted, initially patient tolerated 12 hours, but was agitated and back on full vent support within 24 hours.  Repeat EEG was obtained and showed mild diffuse encephalopathy.  There was evidence of tube feedings leaking around his tracheostomy.  This resulted in cessation of tube feedings for concern of aspiration, in setting of CXR LLL opacity.  He was found to have a coiled cortrak tube.  This was unable to be advanced or repositioned.  Palliative care consult was obtained to outline goals of care.  The patient's family optimally decided to stop life prolonging interventions.    Pertinent Labs and Studies  Significant Diagnostic Studies CT ABDOMEN PELVIS WO CONTRAST  Result Date: 04/03/2021 CLINICAL DATA:  Right upper quadrant pain, leukocytosis, Murphy sign EXAM: CT ABDOMEN AND PELVIS WITHOUT CONTRAST TECHNIQUE: Multidetector CT imaging of the abdomen and pelvis was performed following the standard protocol without IV contrast. COMPARISON:  CTa chest 04/01/2021 FINDINGS: Lower chest: Multifocal patchy ground-glass and consolidative opacities in the lung bases with partial atelectatic collapse of both lower lobes. Bilateral pleural effusions as well including some questionable loculation of the left effusion. Borderline cardiomegaly. 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 Postsurgical changes from prior sternotomy. Hepatobiliary: Lobular hepatic surface contour. Hypertrophy of the left lobe and caudate. No concerning focal liver lesion is seen. Few vascular calcifications.  Hyperdense material layering within the moderately distended gallbladder, possible vicarious extravasation of contrast from recent CT imaging versus layering biliary sludge. No discernible gallstones. No biliary ductal dilatation. Pancreas: Extensive fatty replacement of the pancreas. No pancreatic ductal dilatation or surrounding inflammatory changes. Spleen: Punctate calcifications throughout the spleen likely reflecting granulomata. Normal splenic size. No concerning focal splenic lesion. Adrenals/Urinary Tract: Normal adrenals. Moderate bilateral perinephric stranding is symmetric and nonspecific. Appearance is similar to comparison CT imaging extensive vascular calcifications. No hydronephrosis or evidence of obstructive uropathy. Hyperdense material layering within the partially decompressed urinary bladder likely reflecting excreted contrast media. Bladder wall thickening is nonspecific given underdistention. Stomach/Bowel: Transesophageal tube tip and side port distal to the GE junction, terminating near the gastric antrum. Mild mural thickening and stranding about the terminal ileum as well as diffusely throughout the colonic segments. No evidence of bowel obstruction. Normal caliber appendix in the right quadrant. Vascular/Lymphatic: Extensive atherosclerotic calcification of the abdominal aorta and branch vessels. No aneurysm or ectasia. No other gross complication limitations of an unenhanced CT. Reproductive: Coarse eccentric calcification of the prostate. No concerning abnormalities of the prostate or seminal vesicles. High-riding appearance of the bilateral testes with small hydroceles. Other: Small moderate volume ascites particularly in the right upper quadrant and layering in the deep pelvis pericolic gutter. Extensive circumferential body wall edema. No free intraperitoneal air. Fat and fluid containing left paraumbilical hernia. No bowel containing hernia. Musculoskeletal: No acute osseous  abnormality or suspicious osseous lesion. Multilevel degenerative  changes are present in the imaged portions of the spine. Posterior spurring and calcified disc bulge L5-S1 resulting in severe canal stenosis and bilateral foraminal narrowing. IMPRESSION: 1. Stigmata of cirrhosis with a nodular liver surface contour as well as hypertrophy of the left lobe and caudate. 2. Features of developing anasarca with moderate ascites and extensive circumferential body wall edema as well as bilateral pleural effusions. 3. Moderate distention of the gallbladder with layering hyperdense material which could reflect vicarious extravasation of contrast given recent contrast enhanced exams. No gallbladder wall thickening or biliary ductal dilatation is seen however if there is persisting clinical concern, could consider right upper quadrant ultrasound. 4. Circumferential thickening of the terminal ileum and diffusely throughout the colon, nonspecific in the setting of intrinsic liver disease. Could reflect portal enteropathy/colopathy versus ileocolitis in the appropriate clinical setting. 5. Patchy opacities in bilateral lung bases as well as densely atelectatic bilateral lower lobes, could reflect a combination of atelectasis, underlying airspace disease/infection and/or edema. Bilateral effusions with some questionable loculation of the left effusion as well. 6. Mild cardiomegaly with three-vessel coronary disease in postsurgical changes from sternotomy and CABG. Electronically Signed   By: Lovena Le M.D.   On: 04/03/2021 00:24   DG Chest 1 View  Result Date: 04/18/2021 CLINICAL DATA:  Status post chest tube placement. EXAM: CHEST  1 VIEW COMPARISON:  Single-view of the chest earlier today. FINDINGS: A new pigtail catheter projects in the right lower chest. Support tubes and lines are otherwise unchanged. Right effusion and airspace disease appear unchanged. No pneumothorax. The left lung is clear. Heart size is normal.  IMPRESSION: New pigtail catheter projects in the lower right chest. No change in a right effusion and airspace disease. No pneumothorax or other new abnormality. Electronically Signed   By: Inge Rise M.D.   On: 04/03/2021 10:22   DG Abd 1 View  Result Date: 04/02/2021 CLINICAL DATA:  MRI clearance EXAM: ABDOMEN - 1 VIEW COMPARISON:  None. FINDINGS: Normal abdominal gas pattern. Nasogastric tube seen in the expected distal body of the stomach. No unexpected metallic foreign body noted within the visualized abdomen. IMPRESSION: No unexpected metallic foreign body within the abdomen. Electronically Signed   By: Fidela Salisbury MD   On: 04/02/2021 00:36   CT Angio Chest Pulmonary Embolism (PE) W or WO Contrast  Result Date: 04/01/2021 CLINICAL DATA:  Chest pain, shortness of breath, worsening shock, altered mental status chest EXAM: CT ANGIOGRAPHY CHEST WITH CONTRAST TECHNIQUE: Multidetector CT imaging of the chest was performed using the standard protocol during bolus administration of intravenous contrast. Multiplanar CT image reconstructions and MIPs were obtained to evaluate the vascular anatomy. CONTRAST:  179m OMNIPAQUE IOHEXOL 350 MG/ML SOLN IV COMPARISON:  CT chest 03/10/2021 FINDINGS: Cardiovascular: Atherosclerotic calcifications aorta, proximal great vessels and coronary arteries. Aorta normal caliber without aneurysm or dissection. Heart unremarkable. No pericardial effusion. Pulmonary arteries adequately opacified and patent. Lower lobe pulmonary arteries suboptimally assessed due to degree of atelectasis. No definite evidence of pulmonary embolism. Mediastinum/Nodes: Esophagus unremarkable. No thoracic adenopathy. Base of cervical region normal appearance. Stranding in anterior mediastinum consistent with recent median sternotomy. Lungs/Pleura: BILATERAL pleural effusions. RIGHT thoracostomy tube. Extensive atelectasis versus consolidation of lower lobes. Patchy airspace infiltrates in the  upper lobes bilaterally and RIGHT middle lobe, question edema versus infection. No pneumothorax. Upper Abdomen: Small amount of perihepatic free fluid. Extensive atherosclerotic calcifications. Musculoskeletal: No acute osseous findings. Review of the MIP images confirms the above findings. IMPRESSION: No definite evidence of  pulmonary embolism. Moderate-sized BILATERAL pleural effusions and significant atelectasis versus consolidation of lower lobes. Mild patchy infiltrates in remaining lungs which may represent edema or infection. Stranding in anterior mediastinum consistent with recent median sternotomy. Aortic Atherosclerosis (ICD10-I70.0). Electronically Signed   By: Lavonia Dana M.D.   On: 04/01/2021 15:14   MR BRAIN WO CONTRAST  Result Date: 04/06/2021 CLINICAL DATA:  Acute neurologic deficit. EXAM: MRI HEAD WITHOUT CONTRAST TECHNIQUE: Multiplanar, multiecho pulse sequences of the brain and surrounding structures were obtained without intravenous contrast. COMPARISON:  None. FINDINGS: Brain: No acute infarct, mass effect or extra-axial collection. No acute or chronic hemorrhage. Minimal multifocal hyperintense T2-weight signal within the white matter. 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 The midline structures are normal. Vascular: Major flow voids are preserved. Skull and upper cervical spine: Normal calvarium and skull base. Visualized upper cervical spine and soft tissues are normal. Sinuses/Orbits:Bilateral mastoid effusions. Fluid in the nasopharynx. Normal orbits. IMPRESSION: 1. No acute intracranial abnormality. 2. 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 3. Bilateral mastoid effusions and fluid in the nasopharynx. Electronically Signed   By: Ulyses Jarred M.D.   On: 04/06/2021 19:30   US Abdomen Complete  Result Date: 04/11/2021 CLINICAL DATA:  Abdominal pain EXAM: ABDOMEN ULTRASOUND COMPLETE COMPARISON:  Ultrasound abdomen  04/03/2021. CT abdomen pelvis 04/02/2021 FINDINGS: Gallbladder: Gallbladder sludge. 8 mm gallstone. Negative sonographic Murphy sign. Gallbladder wall mildly thickened 4.5 mm. Negative for pericholecystic fluid. Common bile duct: Diameter: 4.0 mm Liver: Cirrhosis of the liver. Coarse echotexture without focal liver lesion. Portal vein is patent on color Doppler imaging with normal direction of blood flow towards the liver. IVC: No abnormality visualized. Pancreas: Visualized portion unremarkable. Spleen: Size and appearance within normal limits. Right Kidney: Length: 10.0 cm. Echogenicity within normal limits. No mass or hydronephrosis visualized. Left Kidney: Not visualized due to bowel gas. Abdominal aorta: Atherosclerotic aorta without aneurysm. Other findings: Limited study due to inability of the patient to hold breath and hold still. Small ascites.  Right pleural effusion noted. IMPRESSION: Gallbladder sludge with gallstone. Mild gallbladder wall thickening. Negative sonographic Murphy sign. Cirrhosis liver with mild ascites Right pleural effusion Electronically Signed   By: Franchot Gallo M.D.   On: 04/11/2021 16:17   DG CHEST PORT 1 VIEW  Result Date: 04/13/2021 CLINICAL DATA:  Tracheostomy. Respiratory failure. Recent open heart surgery. EXAM: PORTABLE CHEST 1 VIEW COMPARISON:  Abdomen 04/12/2021.  Chest x-ray 04/12/2021 FINDINGS: Tracheostomy tube in stable position. Dobbhoff tube noted with tip coiled over the upper stomach. Prior CABG. Stable cardiomegaly. Persistent bilateral pulmonary infiltrates/edema. Small left pleural effusion again noted. No pneumothorax. IMPRESSION: 1. Tracheostomy tube stable position. Dobbhoff tube again noted with tip coiled in the upper stomach. 2.  Prior CABG.  Stable cardiomegaly. 3. Diffuse bilateral pulmonary infiltrates/edema small left pleural effusion again noted. Findings suggest CHF. Electronically Signed   By: Marcello Moores  Register   On: 04/13/2021 06:56   DG Chest  Port 1 View  Result Date: 04/12/2021 CLINICAL DATA:  Pneumonia EXAM: PORTABLE CHEST 1 VIEW COMPARISON:  04/10/2021 FINDINGS: Increased opacity in the lower left lung is unchanged. Right lung remains clear. Tracheostomy tube and esophageal catheter are unchanged. IMPRESSION: Unchanged left lower lung opacity. Electronically Signed   By: Ulyses Jarred M.D.   On: 04/12/2021 03:58   DG CHEST PORT 1 VIEW  Result Date: 04/10/2021 CLINICAL DATA:  Pleural effusion. EXAM: PORTABLE CHEST 1 VIEW COMPARISON:  04/08/2021 FINDINGS: Again noted is a tracheostomy tube  and feeding tube. The feeding tube extends beyond the image. Patchy and hazy densities throughout the left hemithorax are similar to the recent comparison examination. Persistent opacity or consolidation at the medial left lung base. Few densities at the medial right lung base but otherwise the right lung is clear. IMPRESSION: 1. Stable chest radiograph findings. 2. No significant change in the densities throughout the left lung. Findings could represent a combination of pleural fluid and airspace disease. Electronically Signed   By: Markus Daft M.D.   On: 04/10/2021 09:44   DG CHEST PORT 1 VIEW  Result Date: 04/08/2021 CLINICAL DATA:  Emergency tracheostomy for assistance in breathing EXAM: PORTABLE CHEST 1 VIEW COMPARISON:  Portable exam 1420 hours compared to 04/07/2021 FINDINGS: New tracheostomy tube with tip projecting 6.5 cm above carina. Feeding tube extends into abdomen. Normal heart size mediastinal contours. Diffuse LEFT lung infiltrates greater in LEFT lower lobe, with mild infiltrate at RIGHT base. No pleural effusion or pneumothorax. IMPRESSION: Persistent pulmonary infiltrates. Electronically Signed   By: Lavonia Dana M.D.   On: 04/08/2021 16:31   DG CHEST PORT 1 VIEW  Result Date: 04/07/2021 CLINICAL DATA:  Intubation EXAM: PORTABLE CHEST 1 VIEW COMPARISON:  04/06/2021 FINDINGS: Support Apparatus: --Endotracheal tube: Tip at the level of the  clavicular heads. --Enteric tube:Tip and sideport are below the field of view. --Vascular catheter(s):None --Other: None Left-greater-than-right bibasilar opacities. IMPRESSION: Endotracheal tube tip at the level of the clavicular heads. Unchanged left basilar opacity. Electronically Signed   By: Ulyses Jarred M.D.   On: 04/07/2021 20:43   DG Chest Port 1 View  Result Date: 04/06/2021 CLINICAL DATA:  Check endotracheal tube placement EXAM: PORTABLE CHEST 1 VIEW COMPARISON:  04/04/2021 FINDINGS: Cardiac shadow is stable. Endotracheal tube is seen in satisfactory position. Feeding catheter extends into the stomach. Patchy airspace opacity is noted primarily on the left increased when compared with the prior exam. Associated pleural effusion is seen. Additionally some vascular congestion is noted as well. Postsurgical changes are seen. No pneumothorax is noted. IMPRESSION: Slight increase in the degree of patchy infiltrate within the left lung. Left effusion is stable. Tubes and lines in satisfactory position. Electronically Signed   By: Inez Catalina M.D.   On: 04/06/2021 08:08   DG CHEST PORT 1 VIEW  Result Date: 04/04/2021 CLINICAL DATA:  ET tube EXAM: PORTABLE CHEST 1 VIEW COMPARISON:  04/04/2021 FINDINGS: Endotracheal tube remains in place with the tip approximately 6 cm above the carina. Prior CABG. Small left pleural effusion. Left lower lobe atelectasis or infiltrate. Heart is normal size. Right lung clear. IMPRESSION: Endotracheal tube approximately 6 cm above the carina. Left pleural effusion with left lower lobe atelectasis or infiltrate. Electronically Signed   By: Rolm Baptise M.D.   On: 04/04/2021 22:09   DG CHEST PORT 1 VIEW  Result Date: 04/04/2021 CLINICAL DATA:  Status post CABG. EXAM: PORTABLE CHEST 1 VIEW COMPARISON:  04/03/2021 FINDINGS: A feeding tube extends beyond the inferior aspect of the film. Endotracheal tube is poorly visualized, likely terminating at the level of the clavicles,  approximately 6.5 cm above the carina. Prior median sternotomy. Numerous leads and wires project over the chest. Midline trachea. Mild cardiomegaly. Small left pleural effusion is similar. No pneumothorax. Diffuse pulmonary interstitial thickening. Improved left base airspace disease. IMPRESSION: Similar small left pleural effusion and adjacent Airspace disease, likely atelectasis. No pneumothorax or other acute superimposed finding. Electronically Signed   By: Abigail Miyamoto M.D.   On: 04/04/2021 08:03  DG CHEST PORT 1 VIEW  Result Date: 04/03/2021 CLINICAL DATA:  Respiratory dependent, evaluate endotracheal tube EXAM: PORTABLE CHEST 1 VIEW COMPARISON:  04/02/2021 FINDINGS: There is an endotracheal tube overlying the midthoracic trachea. There is a nasogastric tube coursing below the diaphragm, tip excluded by collimation. Right neck catheter has been removed since the prior exam. Unchanged cardiomediastinal silhouette. There is a small left pleural effusion with adjacent left basilar consolidation. Small right pleural effusion. Diffuse mild interstitial opacities. There is no visible pneumothorax. Bones are unchanged. IMPRESSION: Persistent left greater than right pleural effusions with adjacent basilar atelectasis. Lines and tubes as described above. Right neck catheter has been removed since the prior exam. Electronically Signed   By: Maurine Simmering   On: 04/03/2021 10:10   DG CHEST PORT 1 VIEW  Result Date: 04/02/2021 CLINICAL DATA:  Hypoxia EXAM: PORTABLE CHEST 1 VIEW COMPARISON:  April 01, 2021 FINDINGS: Endotracheal tube tip is 3.8 cm above the carina. Nasogastric tube tip and side port are below the diaphragm. Central catheter tip in superior vena cava. No pneumothorax. There is a left pleural effusion with atelectasis in the left lower lung region. There is been significant interval clearing on the right with right lung essentially clear. Heart size and pulmonary vascularity are normal. Patient is status  post median sternotomy. No adenopathy. No bone lesions. IMPRESSION: Tube and catheter positions as described without pneumothorax. Right lung now clear. There is a left pleural effusion with left base atelectasis. Stable cardiac silhouette. Electronically Signed   By: Lowella Grip III M.D.   On: 04/02/2021 08:03   DG CHEST PORT 1 VIEW  Result Date: 04/01/2021 CLINICAL DATA:  Intubation EXAM: PORTABLE CHEST 1 VIEW COMPARISON:  Portable exam 1445 hours compared to 04/01/2021 FINDINGS: Tip of endotracheal tube projects 5.3 cm above carina. Nasogastric tube extends into stomach. RIGHT jugular line with tip projecting over confluence of SVC. External pacing leads present. Normal heart size post median sternotomy. Bibasilar effusions and atelectasis greater on RIGHT. No pneumothorax or segmental infiltrate. IMPRESSION: Bibasilar pleural effusions and atelectasis greater on RIGHT. Electronically Signed   By: Lavonia Dana M.D.   On: 04/01/2021 14:55   DG Chest Port 1 View  Result Date: 04/01/2021 CLINICAL DATA:  Chest tube.  Open-heart surgery. EXAM: PORTABLE CHEST 1 VIEW COMPARISON:  03/31/2021. FINDINGS: Right IJ sheath in stable position. Right chest tube appears to be in stable position. Lower portion of the tube not imaged. Prior CABG. Cardiomegaly. Progressive diffuse bilateral pulmonary infiltrates/edema. Progressive small bilateral pleural effusions. No pneumothorax. IMPRESSION: 1. Right IJ sheath in stable position. Right chest tube appears to be in stable position. Lower portion of the tube not imaged. No pneumothorax. 2. Prior CABG. Cardiomegaly. Progressive diffuse bilateral pulmonary infiltrates/edema. Progressive small bilateral pleural effusions. Findings consistent with CHF. Electronically Signed   By: Marcello Moores  Register   On: 04/01/2021 08:02   DG Chest Port 1 View  Result Date: 03/31/2021 CLINICAL DATA:  Chest tube.  Open-heart surgery. EXAM: PORTABLE CHEST 1 VIEW COMPARISON:  03/30/2021.  FINDINGS: Interval removal of mediastinal drainage catheter and left chest tube. Right chest tube in stable position. Right IJ sheath in stable position. Prior CABG. Stable cardiomegaly. Progressive bilateral pulmonary infiltrates/edema. Small bilateral pleural effusions. No pneumothorax. IMPRESSION: 1. Interim removal of venous dental drainage catheter left chest tube. Right chest tube in stable position. No pneumothorax. 2. Prior CABG. Cardiomegaly. Progressive bilateral pulmonary infiltrates/edema. Small bilateral pleural effusions. Findings suggest CHF. Electronically Signed   By: Marcello Moores  Register   On: 03/31/2021 07:01   DG Chest Port 1 View  Result Date: 03/30/2021 CLINICAL DATA:  Chest tube. EXAM: PORTABLE CHEST 1 VIEW COMPARISON:  03/28/2021. FINDINGS: Right IJ sheath, mediastinal drainage catheter, bilateral chest tubes in stable position. Prior CABG. Cardiomegaly. Left lung infiltrate again noted. Small left pleural effusion again noted. Previously identified tiny right pneumothorax no longer identified. Tiny left apical pneumothorax cannot be completely excluded on today's exam. IMPRESSION: 1. Lines and tubes including bilateral chest tubes in stable position. Previously identified tiny right pneumothorax no longer identified. Tiny left apical pneumothorax cannot be completely excluded on today's exam. 2.  Prior CABG.  Stable cardiomegaly. 3. Left lung infiltrate again noted. Small left pleural effusion again noted. Electronically Signed   By: Marcello Moores  Register   On: 03/30/2021 06:54   DG CHEST PORT 1 VIEW  Result Date: 03/28/2021 CLINICAL DATA:  Pneumonia EXAM: PORTABLE CHEST 1 VIEW COMPARISON:  March 27, 2021 FINDINGS: Chest tubes and mediastinal drain unchanged. Pigtail catheter on the right unchanged in position. Small lateral pneumothorax on the right unchanged without tension component. Swan-Ganz catheter has been removed. Cordis tip in superior vena cava. Enteric tube has been removed. Left mid  lower lung airspace opacity with small left pleural effusion remains. Right lung is clear. Heart is enlarged with pulmonary vascularity normal. Status post median sternotomy. No bone lesions. No adenopathy. IMPRESSION: Tube and catheter positions as described. Small lateral right pneumothorax without tension component again noted, stable. Airspace opacity consistent with pneumonia on the left again noted with small left pleural effusion. Right lung clear. Stable cardiomegaly. Electronically Signed   By: Lowella Grip III M.D.   On: 03/28/2021 11:33   DG Chest Port 1 View  Result Date: 03/27/2021 CLINICAL DATA:  Status post extubation. Recent coronary artery bypass grafting EXAM: PORTABLE CHEST 1 VIEW COMPARISON:  March 25, 2021 FINDINGS: Endotracheal tube has been removed. Feeding tube tip is below the diaphragm. Swan-Ganz catheter tip is in the main pulmonary outflow tract. Mediastinal drain present. Chest tube evident on each side. There is a pigtail catheter on the right with the tip either in the right base hemithorax or right upper abdomen. There is a small right lateral pneumothorax that tension component. There is a left pleural effusion with ill-defined airspace opacity in the left mid and lower lung regions, increased from recent study. Right lung is clear. There is cardiomegaly with postoperative changes. The pulmonary vascularity is normal. No adenopathy. No bone lesions. IMPRESSION: Tube and catheter positions as described. Small right lateral pneumothorax without tension component. Left pleural effusion with suspected pneumonia in portions of the left mid and lower lung regions. Right lung clear. Stable cardiomegaly. Electronically Signed   By: Lowella Grip III M.D.   On: 03/27/2021 08:47   DG CHEST PORT 1 VIEW  Result Date: 03/25/2021 CLINICAL DATA:  Chest tube in place.  History of open heart surgery. EXAM: PORTABLE CHEST 1 VIEW COMPARISON:  March 24, 2021. FINDINGS: Endotracheal tube tip  projects approximately 4.5 cm above the carina. Right IJ approach Swan-Ganz catheter, bilateral chest tubes, and mediastinal drain are in similar position. Enteric tube courses below the diaphragm in outside the field of view. Improved aeration of the right lung base with streaky left basilar opacities. Improved right pleural effusion. No visible pneumothorax on this single AP semi erect radiograph. IMPRESSION: 1. Improved right pleural effusion and right basilar opacities. Mild streaky left basilar opacities, most likely atelectasis in the postoperative setting. 2. Support  devices, as detailed above. Electronically Signed   By: Margaretha Sheffield MD   On: 03/25/2021 06:55   DG CHEST PORT 1 VIEW  Result Date: 04/22/2021 CLINICAL DATA:  Status post open heart surgery. EXAM: PORTABLE CHEST 1 VIEW COMPARISON:  04/10/2021. FINDINGS: Endotracheal tube, Swan-Ganz catheter, mediastinal drainage catheter, left chest tube in stable position. No pneumothorax. Prior CABG. Heart size stable. Prominent right lower lobe atelectasis and consolidation noted on today's exam. Small right pleural effusion. No pneumothorax. Mild gastric distention. IMPRESSION: 1.  Lines and tubes in stable position. 2.  Prior CABG.  Heart size stable. 3. Prominent right lower lobe atelectasis and consolidation noted on today's exam. Small right pleural effusion. 3.  Mild gastric distention. Electronically Signed   By: Marcello Moores  Register   On: 03/27/2021 07:44   DG CHEST PORT 1 VIEW  Result Date: 04/16/2021 CLINICAL DATA:  Intubated EXAM: PORTABLE CHEST 1 VIEW COMPARISON:  03/12/2021, 03/22/2021, CT 03/14/2021 FINDINGS: Right IJ Swan-Ganz catheter with tip overlying the main pulmonary artery confluence. Endotracheal tube tip about 4.1 cm superior to the carina. Post sternotomy changes in addition to chest and mediastinal drainage catheters. Cardiomegaly with vascular congestion. No pleural effusion, focal airspace disease, or pneumothorax. Esophageal  tube has been removed. IMPRESSION: 1. Endotracheal tube tip about 4.1 cm superior to carina 2. Removal of esophageal tube 3. Cardiomegaly with vascular congestion Electronically Signed   By: Donavan Foil M.D.   On: 04/20/2021 02:30   DG Chest Port 1 View  Result Date: 02/23/2021 CLINICAL DATA:  Status post CABG x4. EXAM: PORTABLE CHEST 1 VIEW COMPARISON:  Mar 22, 2021. FINDINGS: Status post CABG with median sternotomy. Endotracheal tube tip at the level of the clavicular heads. Right IJ approach Swan-Ganz catheter with the tip projecting at the expected location of the main pulmonary artery. Bilateral chest tubes with tips projecting along the lateral lungs bilaterally. Gastric tube courses below the diaphragm with the tip outside the view. Side port projects at the expected location of the stomach. Mediastinal drain. Opacity along the lateral left midlung in the region of the left chest tube tip, which may reflect contusion or atelectasis. No visible pleural effusions or pneumothorax on this single AP semi-erect radiograph. IMPRESSION: 1. Status post CABG with support devices as detailed above. 2. Opacity along the lateral left midlung in the region of the left chest tube tip, which may reflect contusion or atelectasis. Recommend attention on follow-up Electronically Signed   By: Margaretha Sheffield MD   On: 03/22/2021 13:18   DG Chest Port 1 View  Result Date: 03/22/2021 CLINICAL DATA:  Chest pain EXAM: PORTABLE CHEST 1 VIEW COMPARISON:  03/20/2021 FINDINGS: Cardiac shadow is at the upper limits of normal in size. Aortic calcifications are noted. Lungs are well aerated bilaterally. No focal infiltrate or effusion is seen. No bony abnormality is noted. Upper abdomen appears within normal limits. IMPRESSION: No acute abnormality noted. Aortic Atherosclerosis (ICD10-I70.0). Electronically Signed   By: Inez Catalina M.D.   On: 03/22/2021 17:08   DG Abd Portable 1V  Result Date: 04/12/2021 CLINICAL DATA:   Check feeding catheter placement EXAM: PORTABLE ABDOMEN - 1 VIEW COMPARISON:  Film from earlier in the same day. FINDINGS: Feeding catheter is noted in the gastric fundus stable in appearance from the prior exam. IMPRESSION: Stable appearance of feeding catheter within the gastric fundus. Electronically Signed   By: Inez Catalina M.D.   On: 04/12/2021 17:14   DG Abd Portable 1V  Result Date: 04/12/2021  CLINICAL DATA:  NG tube repositioning EXAM: PORTABLE ABDOMEN - 1 VIEW COMPARISON:  Same day radiograph FINDINGS: The nasoenteric feeding catheter tip is looped with tip overlying the gastric fundus in the left upper abdomen. There is gaseous distension of bowel in the upper abdomen. Persistent left lower lung opacity. Prior median sternotomy. No acute osseous abnormality. IMPRESSION: Nasoenteric feeding catheter tip is looped with tip overlying the gastric fundus in the left upper abdomen, and may have been partially retracted since the prior exam. Suggest repositioning. Electronically Signed   By: Maurine Simmering   On: 04/12/2021 14:11   DG Abd Portable 1V  Result Date: 04/12/2021 CLINICAL DATA:  NG tube placement. EXAM: PORTABLE ABDOMEN - 1 VIEW COMPARISON:  04/04/2021 FINDINGS: 0911 hours. The tip of the feeding tube is in the antro pyloric region of the stomach, directed distally towards the duodenum. There is some gaseous distention of colon without evidence of overt small bowel obstruction. Left femoral vascular catheter tip overlies the left sacrum. IMPRESSION: Feeding tube tip is at or near the pylorus directed distally towards the duodenum. Electronically Signed   By: Misty Stanley M.D.   On: 04/12/2021 10:57   DG Abd Portable 1V  Result Date: 04/04/2021 CLINICAL DATA:  Postop.  Follow-up. EXAM: PORTABLE ABDOMEN - 1 VIEW COMPARISON:  CT 04/03/2021.  Plain film of 04/02/2021 FINDINGS: Feeding tube has been minimally advanced, terminating at the antrum/pyloric region. A left femoral catheter is  identified. Nonobstructive bowel gas pattern. Vascular calcifications. IMPRESSION: Feeding tube terminating at the gastric antrum or pylorus. Electronically Signed   By: Abigail Miyamoto M.D.   On: 04/04/2021 08:04   DG Abd Portable 1V  Result Date: 04/02/2021 CLINICAL DATA:  Status post feeding tube placement EXAM: PORTABLE ABDOMEN - 1 VIEW COMPARISON:  One day prior FINDINGS: Nasogastric tube has been removed. Feeding tube terminates at the distal stomach. Prior median sternotomy. Mild cardiomegaly. Small left pleural effusion. Grossly nonobstructive bowel gas pattern. IMPRESSION: Feeding tube terminating at the distal stomach. Electronically Signed   By: Abigail Miyamoto M.D.   On: 04/02/2021 12:40   DG Abd Portable 1V  Result Date: 03/27/2021 CLINICAL DATA:  Status post feeding tube placement. EXAM: PORTABLE ABDOMEN - 1 VIEW COMPARISON:  None. FINDINGS: Feeding tube tip is in the distal stomach directed toward the duodenum. Moderate gaseous distention of the stomach noted. IMPRESSION: As above. Electronically Signed   By: Inge Rise M.D.   On: 04/19/2021 13:15   DG Naso G Tube Plc W/Fl W/Rad  Result Date: 04/13/2021 CLINICAL DATA:  Need for enteral feeding. EXAM: NASO G TUBE PLACEMENT WITH FL AND WITH RAD CONTRAST:  42m OMNIPAQUE IOHEXOL 300 MG/ML  SOLN FLUOROSCOPY TIME:  Fluoroscopy Time:  10 minutes 48 seconds Radiation Exposure Index (if provided by the fluoroscopic device): 157 mGy Number of Acquired Spot Images: 1 COMPARISON:  Abdomen of April 12, 2021. FINDINGS: Staff on the floor attempting fluoroscopic tube placement were unsuccessful. Tube was in the pylorus when I arrived for an attempt at manipulation. Stiff Amplatz wire was passed without difficulty into the tube. The tube was then advanced off the wire into the descending duodenum. Single KUB confirms tube placement. Consolidative changes are noted in the medial LEFT lung base. Stool and gas present in the colon, limited assessment of the  abdomen. Post median sternotomy changes. IMPRESSION: Technically successful fluoroscopic placement of a feeding tube into the descending duodenum as discussed. Electronically Signed   By: GJewel BaizeD.  On: 04/13/2021 16:36   EEG adult  Result Date: 04/07/2021 Lora Havens, MD     04/07/2021  8:19 PM Patient Name: David Rose. MRN: [id number] Epilepsy Attending: Lora Havens Referring Physician/Provider: Hillery Aldo, NP Date: 04/07/2021 Duration: 44 mins Patient history: 55 yo M with sudden onset of unresponsiveness with no clear etiology. EEG to evaluate for status epilepticus. Level of alertness:  lethargic AEDs during EEG study: None Technical aspects: This EEG was obtained using a 10 lead EEG system positioned circumferentially without any parasagittal coverage (rapid EEG). Computer selected EEG is reviewed as well as background features and all clinically significant events. Description: EEG showed continuous generalized low amplitude 3 to 6 Hz theta-delta slowing.Hyperventilation and photic stimulation were not performed.   ABNORMALITY - Continuous slow, generalized IMPRESSION: This study is suggestive of moderate to severe diffuse encephalopathy, nonspecific etiology. No seizures or epileptiform discharges were seen throughout the recording. Dr Rory Percy was notified. Lora Havens   EEG adult  Result Date: 04/01/2021 Lora Havens, MD     04/01/2021  6:01 PM Patient Name: David Rose. MRN: [id number] Epilepsy Attending: Lora Havens Referring Physician/Provider: Dr Noemi Chapel Date: 04/01/2021 Duration: 27.16 mins  Patient history: 55yo M s/p cardiac arrest, ws improving but had an episode of sudden onset unresponsiveness. EEG to evaluate for seizure  Level of alertness: awake  AEDs during EEG study: None  Technical aspects: This EEG study was done with scalp electrodes positioned according to the 10-20 International system of electrode placement. Electrical activity  was acquired at a sampling rate of '500Hz'$  and reviewed with a high frequency filter of '70Hz'$  and a low frequency filter of '1Hz'$ . EEG data were recorded continuously and digitally stored.  Description: EEG showed continuous generalized 5 to 6 Hz theta as well as intermittent 2-'3Hz'$  delta slowing. Hyperventilation and photic stimulation were not performed.     ABNORMALITY - Continuous slow, generalized  IMPRESSION: This study is suggestive of moderate diffuse encephalopathy, nonspecific etiology. No seizures or epileptiform discharges were seen throughout the recording.    Lora Havens    EEG adult  Result Date: 04/10/2021 Lora Havens, MD     03/27/2021  3:47 PM Patient Name: David Rose. MRN: [id number] Epilepsy Attending: Lora Havens Referring Physician/Provider: Dr Ina Homes Date: 04/20/2021 Duration: 23.04 mins Patient history: 55yo M s/p cardiac arrest. EEG to evaluate for seizure Level of alertness: lethargic AEDs during EEG study: None Technical aspects: This EEG study was done with scalp electrodes positioned according to the 10-20 International system of electrode placement. Electrical activity was acquired at a sampling rate of '500Hz'$  and reviewed with a high frequency filter of '70Hz'$  and a low frequency filter of '1Hz'$ . EEG data were recorded continuously and digitally stored. Description: EEG showed continuous generalized 3 to 6 Hz theta-delta slowing.  Hyperventilation and photic stimulation were not performed.   Patient was noted to have head jerking and bilateral shoulder twitching. Concomitant EEG change before, during and after the event didn't show any EEG change to suggest seizure. ABNORMALITY - Continuous slow, generalized IMPRESSION: This study is suggestive of moderate to severe diffuse encephalopathy, nonspecific etiology. No seizures or epileptiform discharges were seen throughout the recording. Patient was noted to have head jerking and bilateral shoulder twitching without  concomitant EEG change and was most likely NOT epileptic. Priyanka Barbra Sarks   Overnight EEG with video  Result Date: 04/09/2021 Lora Havens, MD  04/09/2021 11:28 AM Patient Name: David Rose. MRN: [id number] Epilepsy Attending: Lora Havens Referring Physician/Provider: Dr Kerney Elbe Duration: 04/08/2021 [id number] to 04/09/2021 0934  Patient history: 54 yo M with sudden onset of unresponsiveness with no clear etiology. EEG to evaluate for status epilepticus.  Level of alertness:  lethargic  AEDs during EEG study: None  Technical aspects: This EEG study was done with scalp electrodes positioned according to the 10-20 International system of electrode placement. Electrical activity was acquired at a sampling rate of '500Hz'$  and reviewed with a high frequency filter of '70Hz'$  and a low frequency filter of '1Hz'$ . EEG data were recorded continuously and digitally stored.  Description: EEG initially showed continuous generalized low amplitude 3 to 6 Hz theta-delta slowing admixed with intermittent generalized 15 to '18hz'$  beta activity.  Gradually EEG improved and showed posterior dominant rhythm of 9-10 Hz activity of moderate voltage (25-35 uV) seen predominantly in posterior head regions, symmetric and reactive to eye opening and eye closing.  Intermittent generalized 3 to 6 Hz theta- delta slowing was also noted.  Hyperventilation and photic stimulation were not performed.    IMPRESSION: This study was initially suggestive of moderate diffuse encephalopathy, nonspecific etiology which gradually improved to mild diffuse encephalopathy. No seizures or epileptiform discharges were seen throughout the recording.  Lora Havens   ECHOCARDIOGRAM COMPLETE  Result Date: 04/01/2021    ECHOCARDIOGRAM REPORT   Patient Name:   David Rose. Date of Exam: 04/01/2021 Medical Rec #:  [id number]           Height:       74.0 in Accession #:    [id number]          Weight:       172.6 lb Date of Birth:  08/14/1966             BSA:          2.042 m Patient Age:    62 years            BP:           90/50 mmHg Patient Gender: M                   HR:           97 bpm. Exam Location:  Inpatient Procedure: 2D Echo, Color Doppler and Cardiac Doppler Indications:    CHF  History:        Patient has prior history of Echocardiogram examinations.  Sonographer:    NA Referring Phys: [place]  1. Left ventricular ejection fraction, by estimation, is 45 to 50%. The left ventricle has mildly decreased function. The left ventricle demonstrates regional wall motion abnormalities with basal to mid inferolateral severe hypokinesis. There is mild left ventricular hypertrophy. Left ventricular diastolic parameters are indeterminate due to atrial flutter.  2. Right ventricular systolic function is moderately reduced. The right ventricular size is mildly enlarged. Mildly increased right ventricular wall thickness.  3. Left atrial size was moderately dilated.  4. Right atrial size was mildly dilated.  5. The mitral valve is normal in structure. No evidence of mitral valve regurgitation. No evidence of mitral stenosis.  6. The aortic valve is tricuspid. Aortic valve regurgitation is not visualized. Mild aortic valve sclerosis is present, with no evidence of aortic valve stenosis.  7. The inferior vena cava is dilated in size with <50% respiratory variability, suggesting right atrial pressure of 15 mmHg. FINDINGS  Left Ventricle: Left ventricular ejection fraction, by estimation, is 45 to 50%. The left ventricle has mildly decreased function. The left ventricle demonstrates regional wall motion abnormalities. The left ventricular internal cavity size was normal in size. There is mild left ventricular hypertrophy. Left ventricular diastolic parameters are indeterminate. Right Ventricle: The right ventricular size is mildly enlarged. Mildly increased right ventricular wall thickness. Right ventricular systolic function is moderately  reduced. Left Atrium: Left atrial size was moderately dilated. Right Atrium: Right atrial size was mildly dilated. Pericardium: Trivial pericardial effusion is present. Mitral Valve: The mitral valve is normal in structure. There is mild calcification of the mitral valve leaflet(s). Mild mitral annular calcification. No evidence of mitral valve regurgitation. No evidence of mitral valve stenosis. Tricuspid Valve: The tricuspid valve is normal in structure. Tricuspid valve regurgitation is trivial. Aortic Valve: The aortic valve is tricuspid. Aortic valve regurgitation is not visualized. Mild aortic valve sclerosis is present, with no evidence of aortic valve stenosis. Aortic valve mean gradient measures 5.0 mmHg. Aortic valve peak gradient measures 8.4 mmHg. Aortic valve area, by VTI measures 2.41 cm. Pulmonic Valve: The pulmonic valve was normal in structure. Pulmonic valve regurgitation is not visualized. Aorta: The aortic root is normal in size and structure. Venous: The inferior vena cava is dilated in size with less than 50% respiratory variability, suggesting right atrial pressure of 15 mmHg. IAS/Shunts: No atrial level shunt detected by color flow Doppler.  LEFT VENTRICLE PLAX 2D LVIDd:         4.10 cm  Diastology LVIDs:         3.80 cm  LV e' medial:    8.16 cm/s LV PW:         0.70 cm  LV E/e' medial:  14.0 LV IVS:        1.30 cm  LV e' lateral:   7.51 cm/s LVOT diam:     2.10 cm  LV E/e' lateral: 15.2 LV SV:         53 LV SV Index:   26 LVOT Area:     3.46 cm  RIGHT VENTRICLE            IVC RV Basal diam:  3.60 cm    IVC diam: 2.70 cm RV Mid diam:    3.50 cm RV S prime:     6.05 cm/s TAPSE (M-mode): 1.0 cm LEFT ATRIUM             Index       RIGHT ATRIUM           Index LA diam:        2.90 cm 1.42 cm/m  RA Area:     28.70 cm LA Vol (A2C):   83.0 ml 40.65 ml/m RA Volume:   105.00 ml 51.43 ml/m LA Vol (A4C):   53.2 ml 26.06 ml/m LA Biplane Vol: 66.5 ml 32.57 ml/m  AORTIC VALVE AV Area (Vmax):     2.41 cm AV Area (Vmean):   2.10 cm AV Area (VTI):     2.41 cm AV Vmax:           145.00 cm/s AV Vmean:          100.000 cm/s AV VTI:            0.218 m AV Peak Grad:      8.4 mmHg AV Mean Grad:      5.0 mmHg LVOT Vmax:         101.00 cm/s  LVOT Vmean:        60.700 cm/s LVOT VTI:          0.152 m LVOT/AV VTI ratio: 0.70  AORTA Ao Root diam: 3.00 cm Ao Asc diam:  2.80 cm MITRAL VALVE                TRICUSPID VALVE MV Area (PHT): 4.54 cm     TR Peak grad:   45.4 mmHg MV Decel Time: 167 msec     TR Vmax:        337.00 cm/s MV E velocity: 114.00 cm/s                             SHUNTS                             Systemic VTI:  0.15 m                             Systemic Diam: 2.10 cm Loralie Champagne MD Electronically signed by Loralie Champagne MD Signature Date/Time: 04/01/2021/2:37:18 PM    Final    ECHO INTRAOPERATIVE TEE  Result Date: 03/06/2021  *INTRAOPERATIVE TRANSESOPHAGEAL REPORT *  Patient Name:   David Rose. Date of Exam: 03/02/2021 Medical Rec #:  [id number]           Height:       74.0 in Accession #:    [id number]          Weight:       157.4 lb Date of Birth:  1966-10-04            BSA:          1.96 m Patient Age:    69 years            BP:           112/54 mmHg Patient Gender: M                   HR:           54 bpm. Exam Location:  Anesthesiology Transesophogeal exam was perform intraoperatively during surgical procedure. Patient was closely monitored under general anesthesia during the entirety of examination. Indications:     CAD Sonographer:     Dustin Flock RDCS Performing Phys: Suella Broad MD Diagnosing Phys: Suella Broad MD Complications: No known complications during this procedure. POST-OP IMPRESSIONS     s/p CABG x4 - Left Ventricle: LVEF 40 - 45%, diffuse hypokinesis with no RWMA's. CO > 4L/min, CI > 2.5L/min/m2. - Right Ventricle: Mildly improved with vasopressor support. - Aorta: No dissection noted after cannula removed. Stable plaque burden. - Left Atrium: The left atrium  appears unchanged from pre-bypass.  - Aortic Valve: The aortic valve appears unchanged from pre-bypass. - Mitral Valve: The mitral valve appears unchanged from pre-bypass. - Tricuspid Valve: The tricuspid valve appears unchanged from pre-bypass.  PRE-OP FINDINGS  Left Ventricle: The left ventricle has mild-moderately reduced systolic function, with an ejection fraction of 40-45%. The cavity size was mildly dilated. The left ventricular wall thickness was not assessed. Left ventrical global hypokinesis without regional wall motion abnormalities. There is no left ventricular hypertrophy. Left ventricular diastolic function could not be evaluated. Right Ventricle: The right ventricle has mildly reduced systolic function. The cavity was dialated. There is no increase in  right ventricular wall thickness. There is no aneurysm seen. Left Atrium: Left atrial size was normal in size. No left atrial/left atrial appendage thrombus was detected. The left atrial appendage is well visualized and there is no evidence of thrombus present. Right Atrium: Right atrial size was normal in size. PA catheter traversing the right atrium into the right ventricle. Interatrial Septum: No atrial level shunt detected by color flow Doppler. There is no evidence of a patent foramen ovale. Pericardium: There is no evidence of pericardial effusion. There is a pleural effusion in the left lateral region. Mitral Valve: The mitral valve is normal in structure. Mitral valve regurgitation is trivial by color flow Doppler. The MR jet is centrally-directed. There is no evidence of mitral valve vegetation. There is No evidence of mitral stenosis. Tricuspid Valve: The tricuspid valve was normal in structure. Tricuspid valve regurgitation is trivial by color flow Doppler. No evidence of tricuspid stenosis is present. There is no evidence of tricuspid valve vegetation. Aortic Valve: The aortic valve is tricuspid Aortic valve regurgitation was not visualized  by color flow Doppler. There is no stenosis of the aortic valve. There is no evidence of aortic valve vegetation. There is moderate thickening and mild calcification present on the aortic valve right coronary, left coronary and non-coronary cusps with normal mobility. Pulmonic Valve: The pulmonic valve was normal in structure, with normal. No evidence of pumonic stenosis. Pulmonic valve regurgitation is not visualized by color flow Doppler. Aorta: The aortic root, ascending aorta and aortic arch are normal in size and structure. There is evidence of plaque in the descending aorta; Grade II, measuring 2-79m in size. Pulmonary Artery: SGordy Councilmancatheter present on the right. The pulmonary artery is of normal size. Pulmonary hypertension is moderate. Venous: The inferior vena cava was not well visualized. Shunts: There is no evidence of an atrial septal defect.  KSuella BroadMD Electronically signed by KSuella BroadMD Signature Date/Time: 02/26/2021/1:43:52 PM    Final    CT HEAD CODE STROKE WO CONTRAST  Result Date: 04/01/2021 CLINICAL DATA:  Code stroke.  Neuro deficit, acute stroke suspected. EXAM: CT HEAD WITHOUT CONTRAST TECHNIQUE: Contiguous axial images were obtained from the base of the skull through the vertex without intravenous contrast. COMPARISON:  None. FINDINGS: Brain: No evidence of acute large vascular territory infarction, hemorrhage, hydrocephalus, extra-axial collection or mass lesion/mass effect. Small remote appearing lacunar infarcts in bilateral frontal lobe white matter. Evaluation of posterior fossa is limited by streak artifact. Vascular: No hyperdense vessel identified. Skull: No acute fracture. Sinuses/Orbits: Opacified anterior right ethmoid air cell with mild mucosal thickening of the ethmoid air cells. Otherwise clear sinuses. Unremarkable orbits. Other: No mastoid effusions. ASPECTS ([facility] [place]roke Program Early CT Score) total score (0-10 with 10 being normal): 10. IMPRESSION: 1. No  evidence of acute large vascular territory infarct or acute hemorrhage. ASPECTS is 10. 2. Small remote appearing lacunar infarcts in bilateral frontal lobe white matter. An MRI could provide more sensitive evaluation for acute infarct if clinically indicated. Code stroke imaging results were communicated on 04/01/2021 at 2:06 pm to provider Dr. SQuinn Axevia secure text paging. Electronically Signed   By: FMargaretha SheffieldMD   On: 04/01/2021 14:07   [place]KG SITE RITE  Result Date: 04/06/2021 If Site Rite image not attached, placement could not be confirmed due to current cardiac rhythm.  [place]KG SITE RITE  Result Date: 04/02/2021 If Site Rite image not attached, placement could not be confirmed due to current cardiac  rhythm.  CT ANGIO HEAD CODE STROKE  Result Date: 04/01/2021 CLINICAL DATA:  Neuro deficit, acute stroke suspected. EXAM: CT ANGIOGRAPHY HEAD AND NECK TECHNIQUE: Multidetector CT imaging of the head and neck was performed using the standard protocol during bolus administration of intravenous contrast. Multiplanar CT image reconstructions and MIPs were obtained to evaluate the vascular anatomy. Carotid stenosis measurements (when applicable) are obtained utilizing NASCET criteria, using the distal internal carotid diameter as the denominator. CONTRAST:  36m OMNIPAQUE IOHEXOL 350 MG/ML SOLN COMPARISON:  None. FINDINGS: CTA NECK FINDINGS Aortic arch: Calcific atherosclerosis of the aorta. Great vessel origins are patent. Right carotid system: Predominately calcific atherosclerosis at the carotid bifurcation without greater than 50% narrowing. The internal carotid artery is small throughout its course without focal hemodynamically significant stenosis. Left carotid system: Predominately calcific atherosclerosis at the carotid bifurcation without greater than 50% narrowing. The internal carotid artery is small throughout its course without focal hemodynamically significant stenosis. Vertebral arteries:  Severe stenosis of the right vertebral artery origin secondary to atherosclerosis. Skeleton: No evidence of acute abnormality on limited assessment. Other neck: Mild diffuse soft tissue swelling. Upper chest: Please see concurrent CT chest for intrathoracic findings. Review of the MIP images confirms the above findings CTA HEAD FINDINGS Anterior circulation: Bilateral calcific and paraclinoid ICA calcific atherosclerosis without evidence of flow limiting stenosis. Bilateral MCAs and ACAs are patent without evidence of proximal hemodynamically significant stenosis. No aneurysm. Posterior circulation: Small left vertebral artery. Moderate left and mild right narrowing of the intradural vertebral arteries proximally due to calcific atherosclerosis. The basilar artery and bilateral posterior cerebral arteries are patent without proximal hemodynamically significant stenosis. No aneurysm. Venous sinuses: As permitted by contrast timing, patent. Review of the MIP images confirms the above findings IMPRESSION: CTA Head: 1. No large vessel occlusion. 2. 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 CTA Neck: 1. Severe atherosclerotic narrowing of the right vertebral artery origin. 2. Bilateral carotid bifurcation atherosclerosis without greater than 50% narrowing. 3. Small internal carotid arteries in the neck, nonspecific but potentially chronic/congenital. 4. Mild diffuse subcutaneous soft tissue swelling, suggestive of anasarca. 5. Please see concurrent CT chest for intrathoracic findings. Findings discussed with Dr. SFuller Planat 2:28 p.m. via telephone. Electronically Signed   By: FMargaretha SheffieldMD   On: 04/01/2021 14:40   CT ANGIO NECK CODE STROKE  Result Date: 04/01/2021 CLINICAL DATA:  Neuro deficit, acute stroke suspected. EXAM: CT ANGIOGRAPHY HEAD AND NECK TECHNIQUE: Multidetector CT imaging of the head and neck was performed using the standard protocol during bolus administration of  intravenous contrast. Multiplanar CT image reconstructions and MIPs were obtained to evaluate the vascular anatomy. Carotid stenosis measurements (when applicable) are obtained utilizing NASCET criteria, using the distal internal carotid diameter as the denominator. CONTRAST:  746mOMNIPAQUE IOHEXOL 350 MG/ML SOLN COMPARISON:  None. FINDINGS: CTA NECK FINDINGS Aortic arch: Calcific atherosclerosis of the aorta. Great vessel origins are patent. Right carotid system: Predominately calcific atherosclerosis at the carotid bifurcation without greater than 50% narrowing. The internal carotid artery is small throughout its course without focal hemodynamically significant stenosis. Left carotid system: Predominately calcific atherosclerosis at the carotid bifurcation without greater than 50% narrowing. The internal carotid artery is small throughout its course without focal hemodynamically significant stenosis. Vertebral arteries: Severe stenosis of the right vertebral artery origin secondary to atherosclerosis. Skeleton: No evidence of acute abnormality on limited assessment. Other neck: Mild diffuse soft tissue swelling. Upper chest: Please see concurrent CT chest for intrathoracic findings. Review  of the MIP images confirms the above findings CTA HEAD FINDINGS Anterior circulation: Bilateral calcific and paraclinoid ICA calcific atherosclerosis without evidence of flow limiting stenosis. Bilateral MCAs and ACAs are patent without evidence of proximal hemodynamically significant stenosis. No aneurysm. Posterior circulation: Small left vertebral artery. Moderate left and mild right narrowing of the intradural vertebral arteries proximally due to calcific atherosclerosis. The basilar artery and bilateral posterior cerebral arteries are patent without proximal hemodynamically significant stenosis. No aneurysm. Venous sinuses: As permitted by contrast timing, patent. Review of the MIP images confirms the above findings  IMPRESSION: CTA Head: 1. No large vessel occlusion. 2. 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 CTA Neck: 1. Severe atherosclerotic narrowing of the right vertebral artery origin. 2. Bilateral carotid bifurcation atherosclerosis without greater than 50% narrowing. 3. Small internal carotid arteries in the neck, nonspecific but potentially chronic/congenital. 4. Mild diffuse subcutaneous soft tissue swelling, suggestive of anasarca. 5. Please see concurrent CT chest for intrathoracic findings. Findings discussed with Dr. Fuller Plan at 2:28 p.m. via telephone. Electronically Signed   By: Margaretha Sheffield MD   On: 04/01/2021 14:40   US Abdomen Limited RUQ (LIVER/GB)  Result Date: 04/03/2021 CLINICAL DATA:  Sepsis. EXAM: ULTRASOUND ABDOMEN LIMITED RIGHT UPPER QUADRANT COMPARISON:  CT scan April 02, 2021 FINDINGS: Gallbladder: Sludge in the gallbladder. The gallbladder wall measures up to 4.4 mm. No stones or Murphy's sign reported. Common bile duct: Diameter: 3.1 mm Liver: The liver demonstrates a nodular contour with no focal mass. Portal vein is patent on color Doppler imaging with normal direction of blood flow towards the liver. Other: Right pleural effusion.  Ascites. IMPRESSION: 1. Sludge in the gallbladder. Mild gallbladder wall thickening. No Murphy's sign or stone identified. 2. Nodular contour to the liver suggesting cirrhosis. 3. Right pleural effusion and ascites. Electronically Signed   By: Dorise Bullion III M.D   On: 04/03/2021 18:13    Microbiology Recent Results (from the past 240 hour(s))  Culture, Respiratory w Gram Stain     Status: None   Collection Time: 04/10/21 10:46 AM   Specimen: Tracheal Aspirate; Respiratory  Result Value Ref Range Status   Specimen Description TRACHEAL ASPIRATE  Final   Special Requests NONE  Final   Gram Stain   Final    ABUNDANT WBC PRESENT,BOTH PMN AND MONONUCLEAR FEW GRAM POSITIVE COCCI RARE GRAM VARIABLE ROD Performed at [facility], [place] [address] [place], [place] [address]    Culture ABUNDANT STAPHYLOCOCCUS EPIDERMIDIS  Final   Report Status 04/13/2021 FINAL  Final   Organism ID, Bacteria STAPHYLOCOCCUS EPIDERMIDIS  Final      Susceptibility   Staphylococcus epidermidis - MIC*    CIPROFLOXACIN >=8 RESISTANT Resistant     ERYTHROMYCIN >=8 RESISTANT Resistant     GENTAMICIN <=0.5 SENSITIVE Sensitive     OXACILLIN >=4 RESISTANT Resistant     TETRACYCLINE 2 SENSITIVE Sensitive     VANCOMYCIN <=0.5 SENSITIVE Sensitive     TRIMETH/SULFA 80 RESISTANT Resistant     CLINDAMYCIN >=8 RESISTANT Resistant     RIFAMPIN <=0.5 SENSITIVE Sensitive     Inducible Clindamycin NEGATIVE Sensitive     * ABUNDANT STAPHYLOCOCCUS EPIDERMIDIS    Lab Basic Metabolic Panel: Recent Labs  Lab 04/12/21 1553 04/13/21 0405 04/13/21 1600 04/13/21 2259 04/14/21 0343 04/14/21 1600  NA 134* 134* 135 138 134* 136  K 4.5 4.7 4.5 4.6 4.6 5.0  CL 103 103 106  --  102 105  CO2 24 24  22  --  24 24  GLUCOSE 144* 120* 190*  --  182* 285*  BUN 30* 23* 25*  --  22* 18  CREATININE 1.11 1.00 1.17  --  1.16 1.20  CALCIUM 8.3* 8.5* 8.3*  --  8.6* 8.5*  MG  --  2.4  --   --  2.6*  --   PHOS 3.6 2.7 2.9  --  2.9 4.3   Liver Function Tests: Recent Labs  Lab 04/12/21 1553 04/13/21 0405 04/13/21 1600 04/14/21 0343 04/14/21 1600  ALBUMIN 1.9* 1.8* 1.8* 1.8* 1.8*   No results for input(s): LIPASE, AMYLASE in the last 168 hours. Recent Labs  Lab 04/12/21 1553  AMMONIA 43*   CBC: Recent Labs  Lab 04/13/21 0405 04/13/21 1636 04/13/21 2259 04/14/21 0343  WBC 27.6* 25.3*  --  22.3*  HGB 9.6* 9.0* 9.9* 8.8*  HCT 31.6* 30.2* 29.0* 29.9*  MCV 103.9* 104.9*  --  104.5*  PLT 258 271  --  263   Cardiac Enzymes: No results for input(s): CKTOTAL, CKMB, CKMBINDEX, TROPONINI in the last 168 hours. Sepsis Labs: Recent Labs  Lab 04/13/21 0405 04/13/21 1636 04/14/21 0343  WBC 27.6* 25.3* 22.3*    Procedures/Operations   Cortrak placed on 04/02/2021 2. Temporary HD catheter 3. EEG done by Neurology on 04/18/2021,  04/01/2021, 04/07/2021 and 04/09/2021. 4. Re intubated 04/01/2021 5. Re exploration 03/30/2021 by Dr. Orvan Seen 6. Central Venous Catheter Insertion by CCM 7. Insertion of right chest tube 04/13/2021 8. Coronary Artery Bypass Grafting x 4             Left Internal Mammary Artery to Distal Left Anterior Descending Coronary Artery; Saphenous Vein Graft to distal Right Coronary Artery; Saphenous Vein Graft to Obtuse Marginal Branch of Left Circumflex Coronary Artery; Sapheonous Vein Graft to Diagonal Branch Coronary Artery;Endoscopic Vein Harvest from right Thigh and Lower Leg by Dr. Orvan Seen on 02/24/2021. 9. Post operative exploration on 04/02/2021 10. Patient self extubated then re intubated on 04/05/2021. 11. Extubated 04/07/2021 with re intubation later that day 12. Percutaneous Tracheostomy with Bronchoscopic Guidance by Pulmonary/CCM on 04/08/2021   Nani Skillern PA-C 04/19/2021, 3:32 PM

## 2021-04-23 NOTE — Progress Notes (Signed)
This chaplain responded to the PMT consult and family request for prayer with compassionate extubation.  The Pt.'s siblings are at the [facility] along with the PMT and RN.  The chaplain created the sacred space and participated in reflective listening during the family's story telling. The chaplain understands the Pt. 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   After Pt. and family centered prayer, the chaplain stepped away and is honoring the family's time with the Pt.   The chaplain can be contacted for F/U spiritual care as needed through the pager [phone].

## 2021-04-23 DEATH — deceased

## 2021-05-10 ENCOUNTER — Ambulatory Visit: Payer: Medicare Other | Admitting: Cardiology

## 2023-04-14 IMAGING — DX DG ABD PORTABLE 1V
2 series · 2 of 2 positions shown · non-contrast
Comparison: 04/04/2021

CLINICAL DATA: NG tube placement.

EXAM:
PORTABLE ABDOMEN - 1 VIEW

[abdomen supine (1 of 2)]
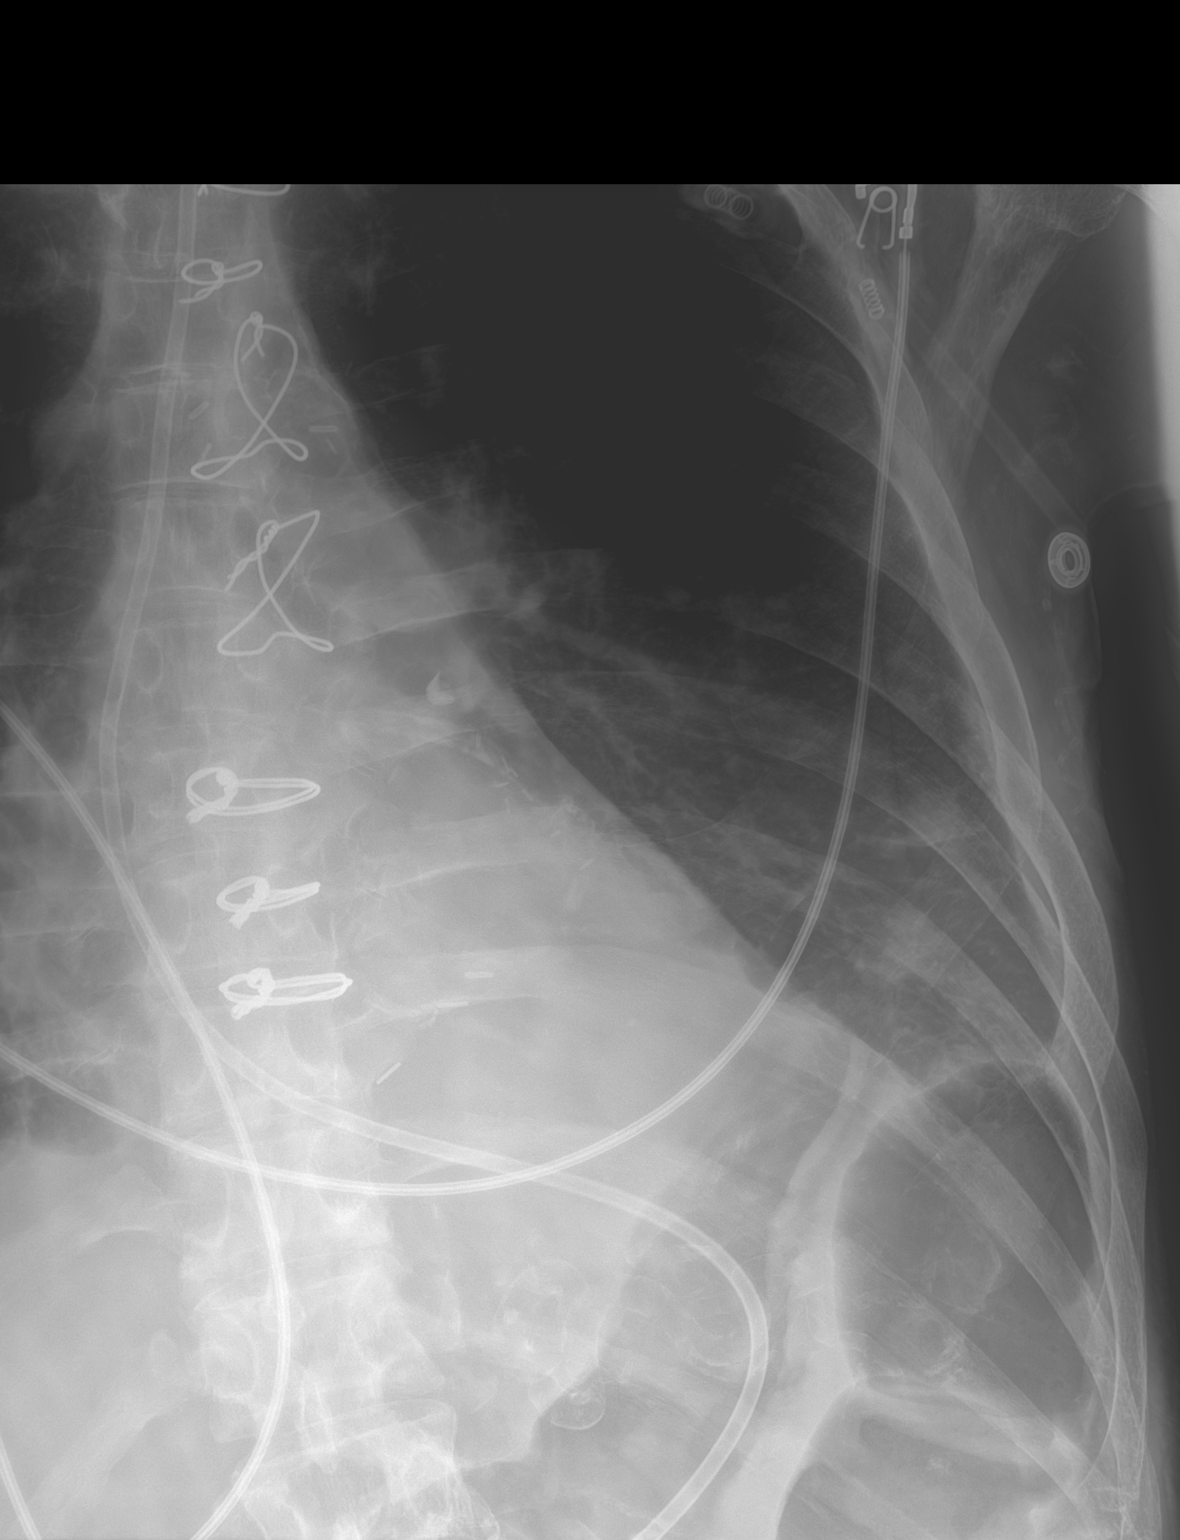

[abdomen supine (2 of 2)]
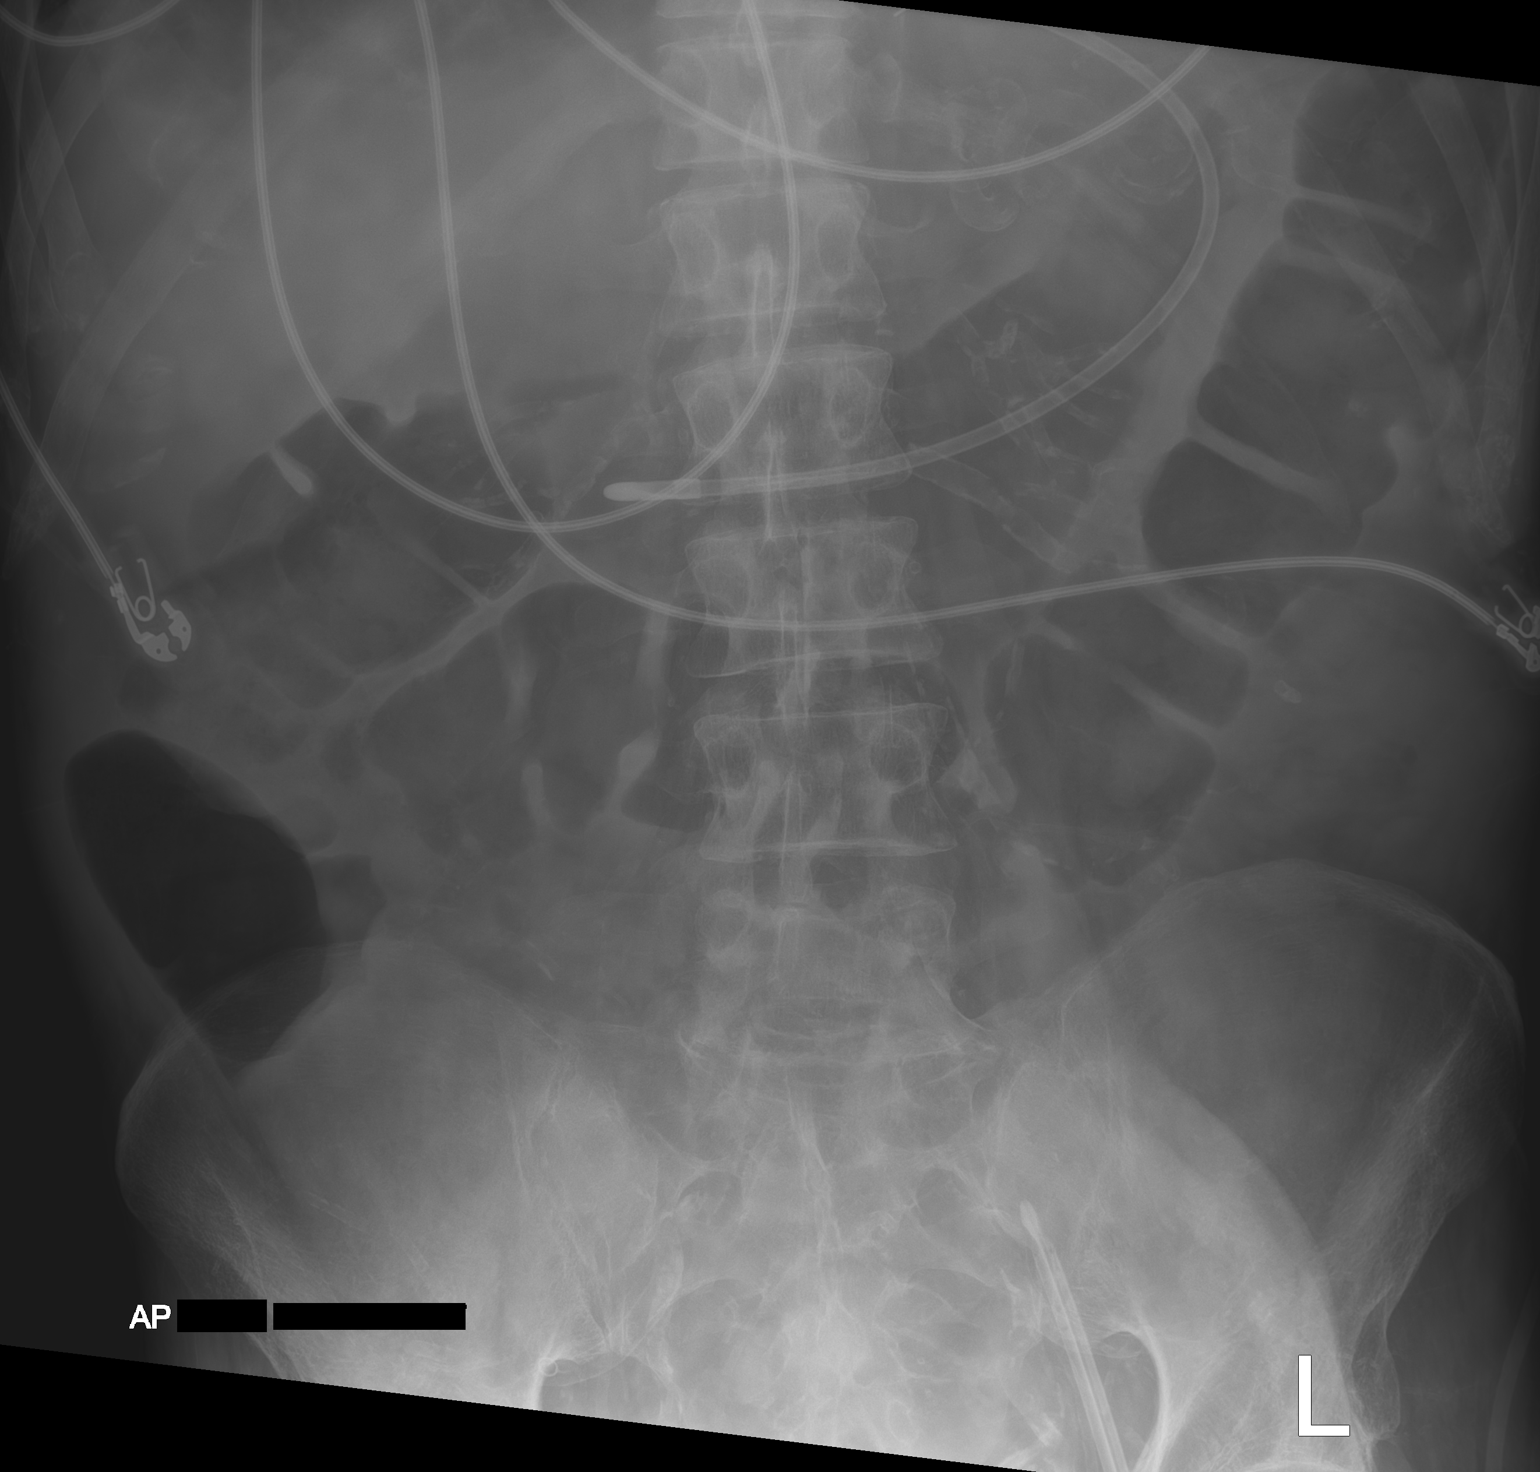

[2 of 2 positions shown; findings below may reference images not displayed]

FINDINGS: 9188 hours. The tip of the feeding tube is in the antro pyloric
region of the stomach, directed distally towards the duodenum. There
is some gaseous distention of colon without evidence of overt small
bowel obstruction. Left femoral vascular catheter tip overlies the
left sacrum.
IMPRESSION: Feeding tube tip is at or near the pylorus directed distally towards
the duodenum.

## 2023-04-15 IMAGING — DX DG NASO G TUBE PLC W/FL W/RAD
1 series · 1 of 1 positions shown · IV contrast (omnipaque)
Comparison: Abdomen April 12, 2021.

CLINICAL DATA: Need for enteral feeding.

EXAM:
NASO G TUBE PLACEMENT WITH FL AND WITH RAD
CONTRAST:  10mL OMNIPAQUE IOHEXOL 300 MG/ML  SOLN
FLUOROSCOPY TIME:  Fluoroscopy Time:  10 minutes 48 seconds
Radiation Exposure Index (if provided by the fluoroscopic device):
157 mGy
Number of Acquired Spot Images: 1

[abdomen]
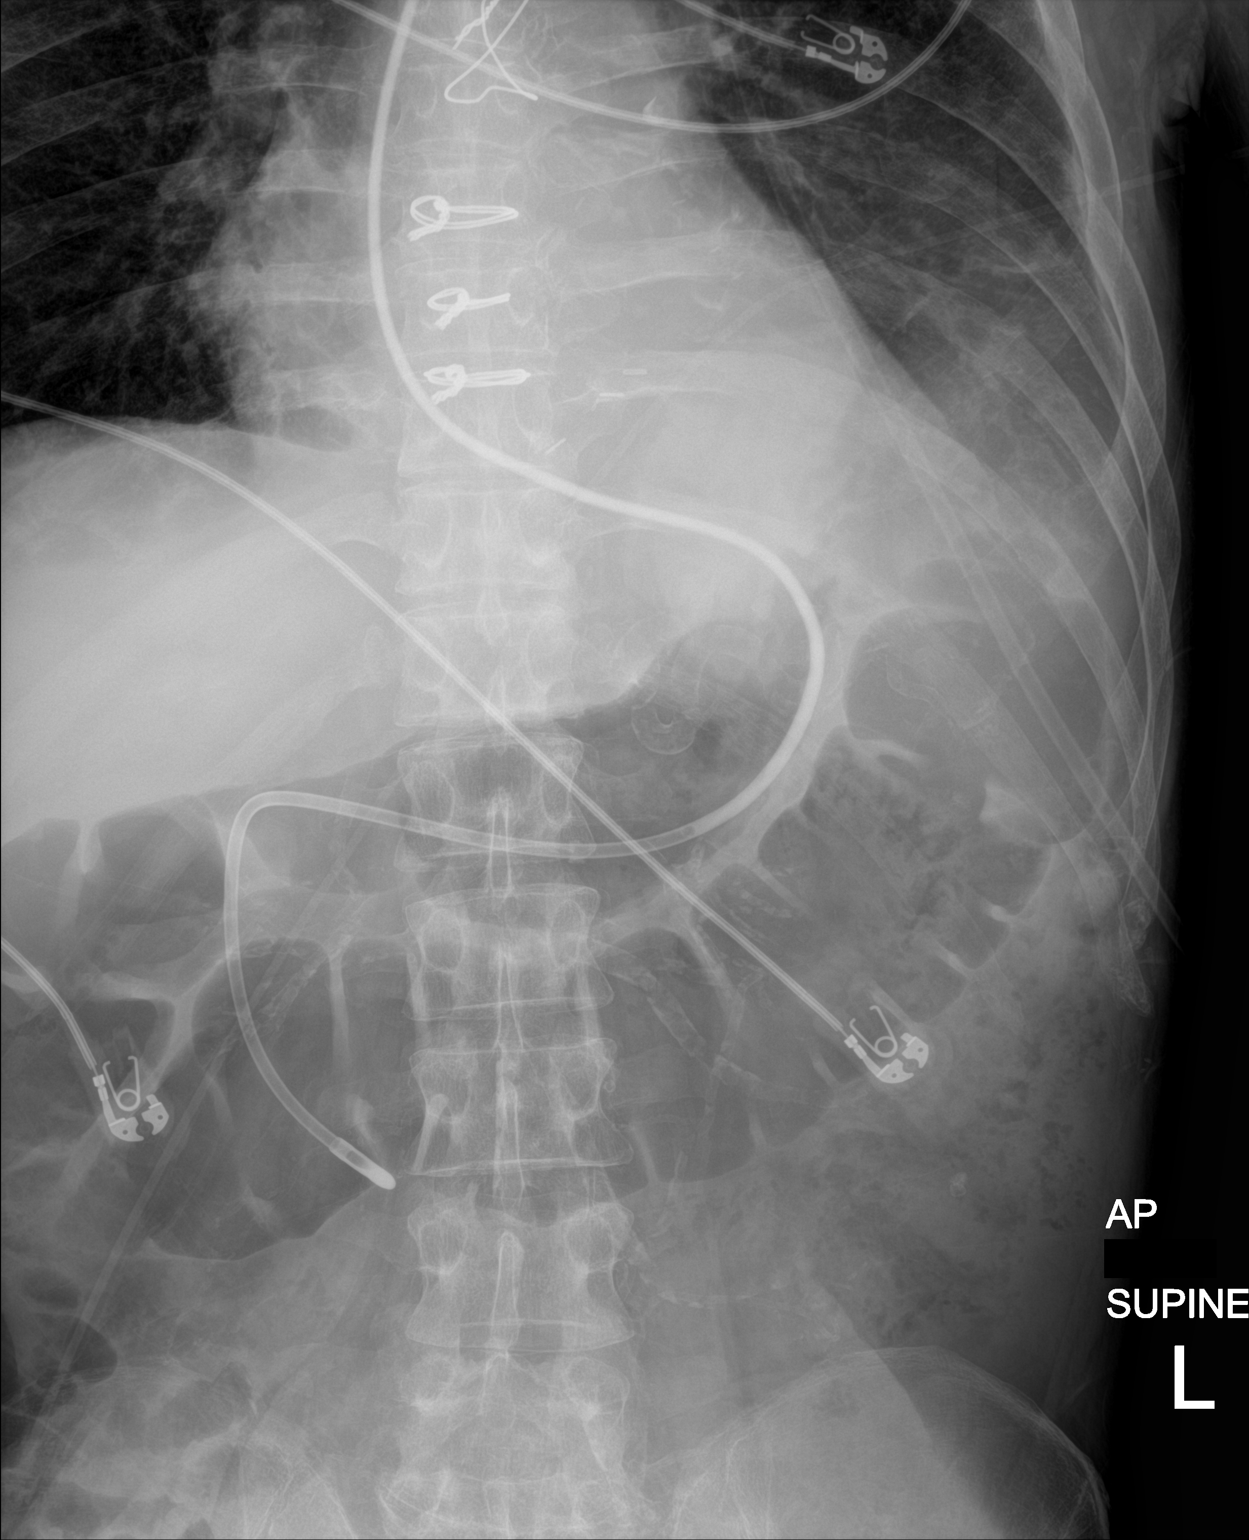

[1 of 1 positions shown; findings below may reference images not displayed]

FINDINGS: Staff on the floor attempting fluoroscopic tube placement were
unsuccessful. Tube was in the pylorus when I arrived for an attempt
at manipulation. Stiff Amplatz wire was passed without difficulty
into the tube. The tube was then advanced off the wire into the
descending duodenum. Single KUB confirms tube placement.

Consolidative changes are noted in the medial LEFT lung base.

Stool and gas present in the colon, limited assessment of the
abdomen. Post median sternotomy changes.
IMPRESSION: Technically successful fluoroscopic placement of a feeding tube into
the descending duodenum as discussed.

## 2023-04-15 IMAGING — DX DG CHEST 1V PORT
1 series · 2 of 2 positions shown · non-contrast
Comparison: Abdomen 04/12/2021.  Chest x-ray 04/12/2021

CLINICAL DATA: Tracheostomy. Respiratory failure. Recent open heart
surgery.

EXAM:
PORTABLE CHEST 1 VIEW

[Series 1: chest · 0.14mm/px · 2 of 2 slices shown]
[im 1/2]
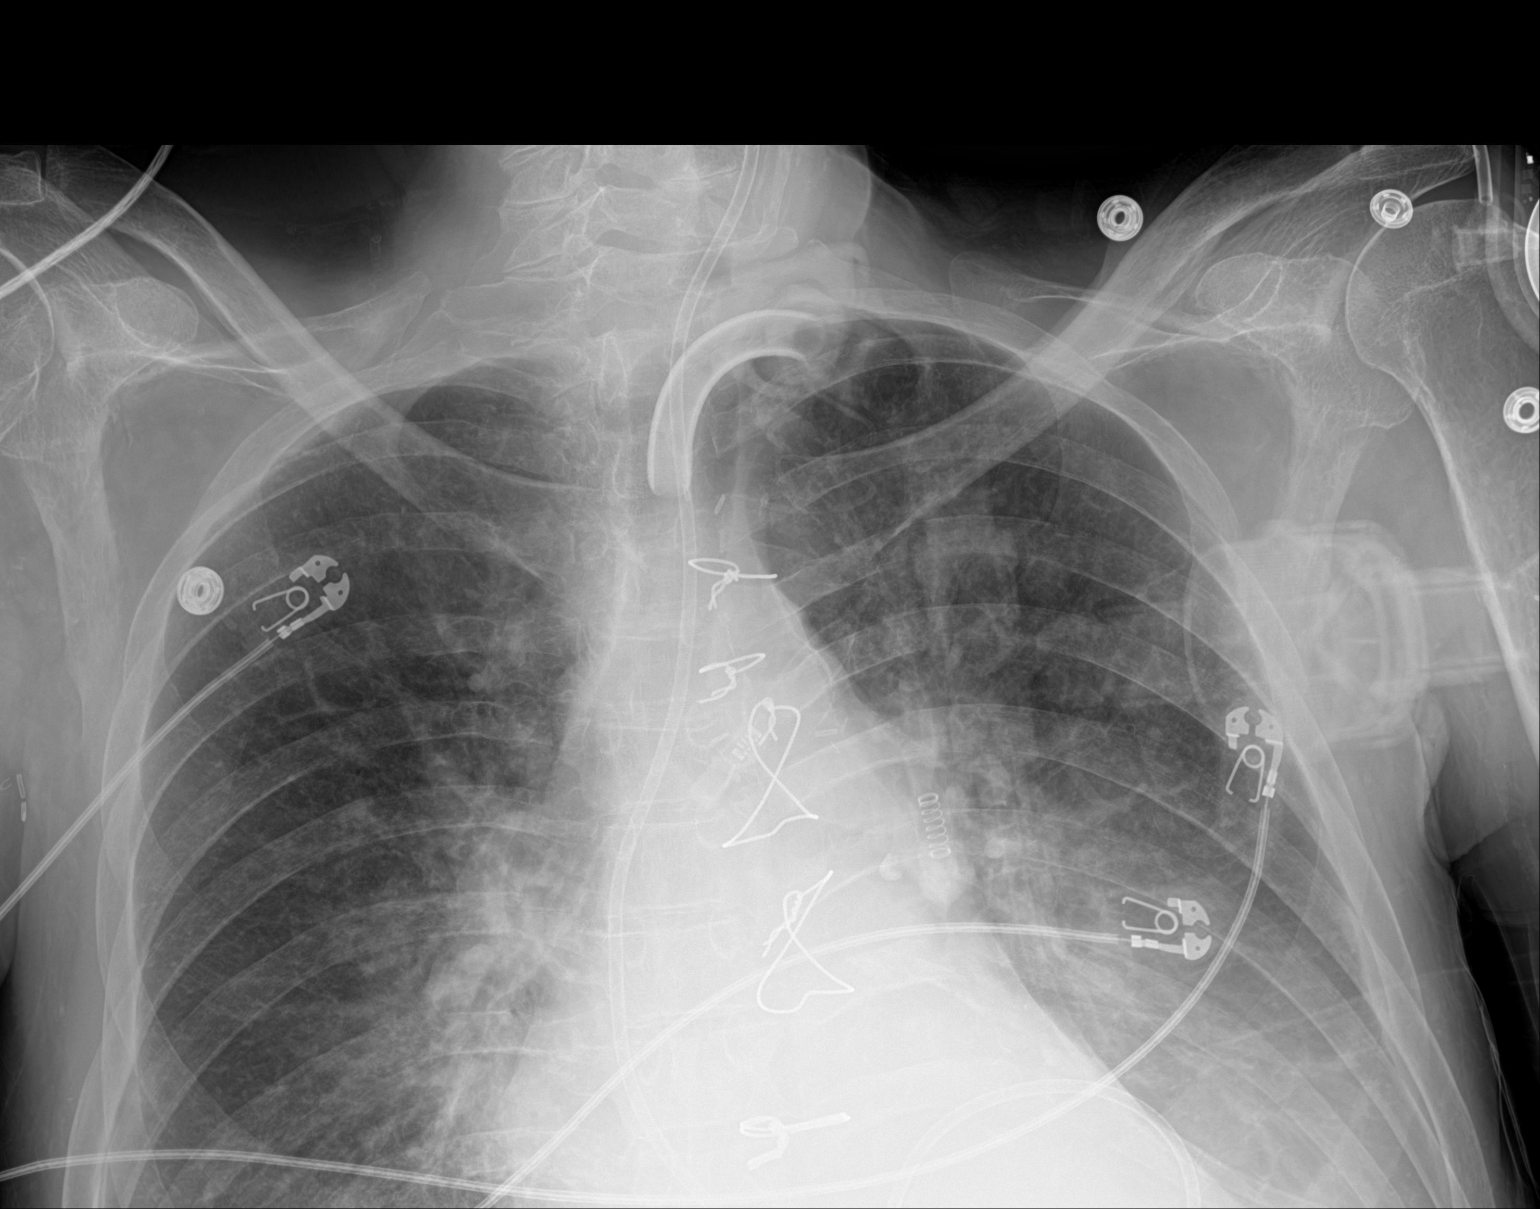
[im 2/2]
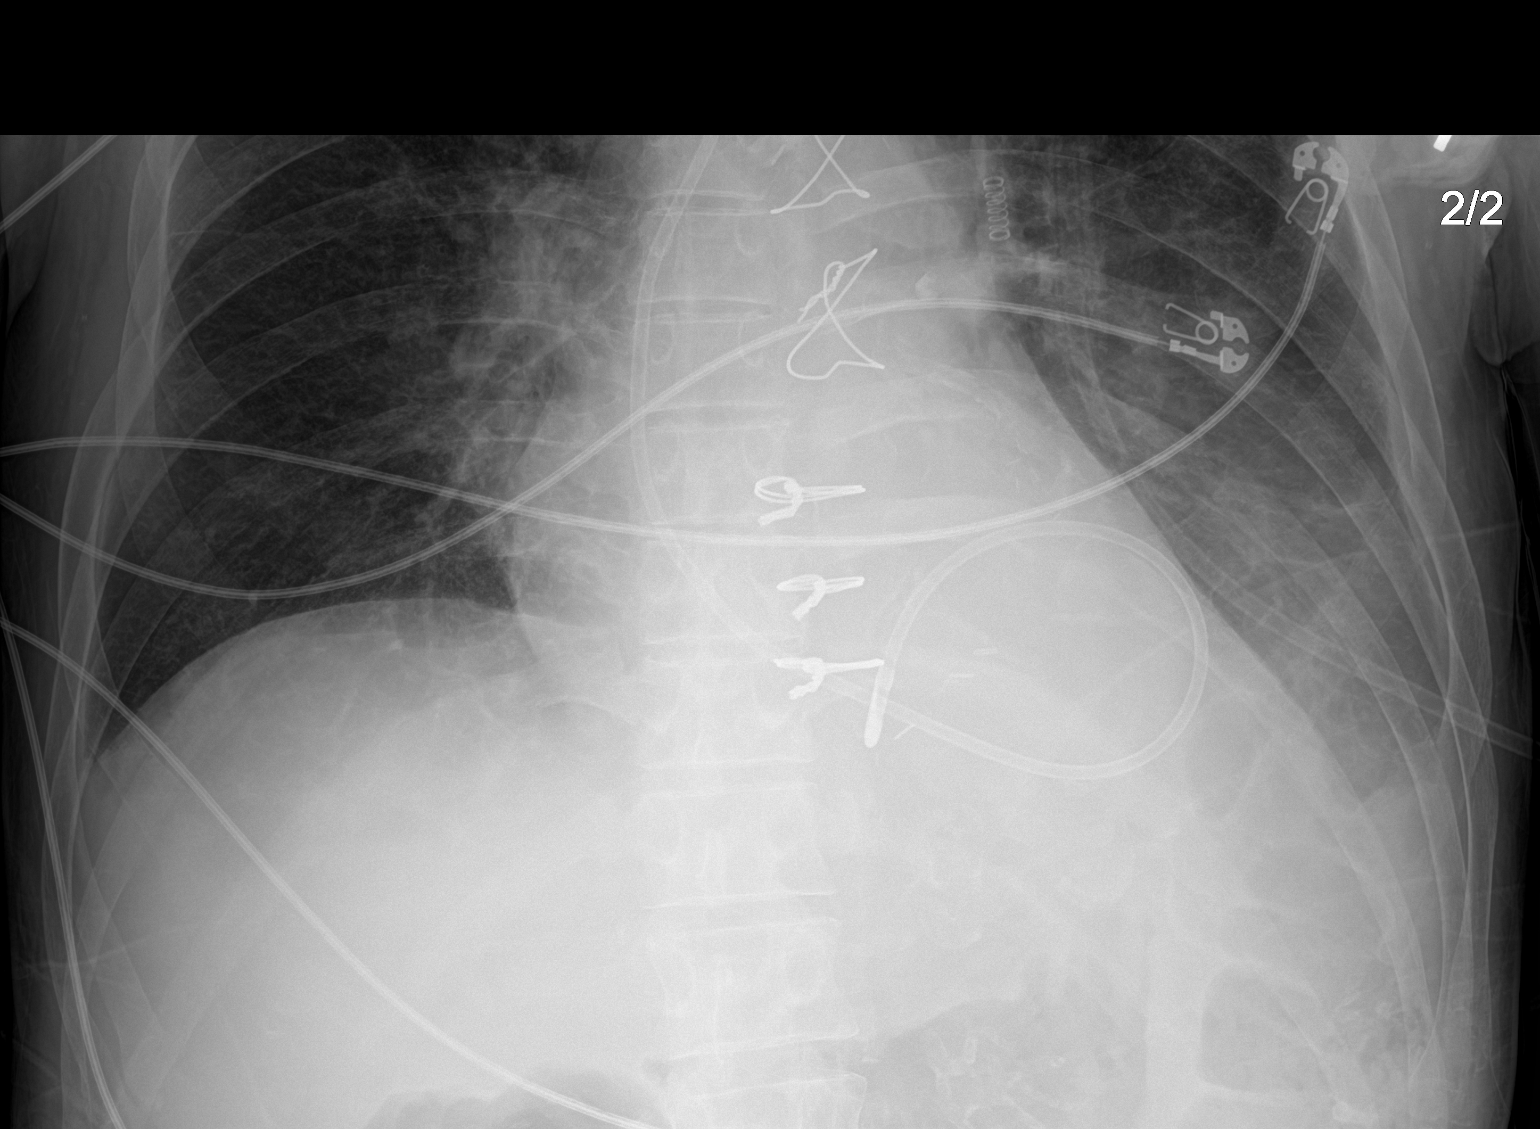

[2 of 2 positions shown; findings below may reference images not displayed]

FINDINGS: Tracheostomy tube in stable position. Dobbhoff tube noted with tip
coiled over the upper stomach. Prior CABG. Stable cardiomegaly.
Persistent bilateral pulmonary infiltrates/edema. Small left pleural
effusion again noted. No pneumothorax.
IMPRESSION: 1. Tracheostomy tube stable position. Dobbhoff tube again noted with
tip coiled in the upper stomach.

2.  Prior CABG.  Stable cardiomegaly.

3. Diffuse bilateral pulmonary infiltrates/edema small left pleural
effusion again noted. Findings suggest CHF.
